# Patient Record
Sex: Male | Born: 1950
Health system: Southern US, Community
[De-identification: ages and names within clinical notes are randomized; demographics above are authoritative.]

## PROBLEM LIST (undated history)

## (undated) DIAGNOSIS — T7840XA Allergy, unspecified, initial encounter: Secondary | ICD-10-CM

## (undated) DIAGNOSIS — N138 Other obstructive and reflux uropathy: Secondary | ICD-10-CM

## (undated) DIAGNOSIS — C3491 Malignant neoplasm of unspecified part of right bronchus or lung: Secondary | ICD-10-CM

## (undated) DIAGNOSIS — J189 Pneumonia, unspecified organism: Secondary | ICD-10-CM

## (undated) DIAGNOSIS — N401 Enlarged prostate with lower urinary tract symptoms: Secondary | ICD-10-CM

## (undated) DIAGNOSIS — J449 Chronic obstructive pulmonary disease, unspecified: Secondary | ICD-10-CM

## (undated) DIAGNOSIS — M1990 Unspecified osteoarthritis, unspecified site: Secondary | ICD-10-CM

## (undated) DIAGNOSIS — R0602 Shortness of breath: Secondary | ICD-10-CM

## (undated) DIAGNOSIS — C3431 Malignant neoplasm of lower lobe, right bronchus or lung: Secondary | ICD-10-CM

## (undated) DIAGNOSIS — N5201 Erectile dysfunction due to arterial insufficiency: Secondary | ICD-10-CM

## (undated) DIAGNOSIS — K219 Gastro-esophageal reflux disease without esophagitis: Secondary | ICD-10-CM

## (undated) DIAGNOSIS — I499 Cardiac arrhythmia, unspecified: Secondary | ICD-10-CM

## (undated) DIAGNOSIS — C801 Malignant (primary) neoplasm, unspecified: Secondary | ICD-10-CM

## (undated) DIAGNOSIS — F32A Depression, unspecified: Secondary | ICD-10-CM

## (undated) DIAGNOSIS — IMO0002 Reserved for concepts with insufficient information to code with codable children: Secondary | ICD-10-CM

## (undated) DIAGNOSIS — E785 Hyperlipidemia, unspecified: Secondary | ICD-10-CM

## (undated) DIAGNOSIS — I10 Essential (primary) hypertension: Secondary | ICD-10-CM

## (undated) DIAGNOSIS — F329 Major depressive disorder, single episode, unspecified: Secondary | ICD-10-CM

## (undated) HISTORY — DX: Erectile dysfunction due to arterial insufficiency: N52.01

## (undated) HISTORY — DX: Malignant neoplasm of lower lobe, right bronchus or lung: C34.31

## (undated) HISTORY — DX: Other obstructive and reflux uropathy: N13.8

## (undated) HISTORY — PX: BACK SURGERY: SHX140

## (undated) HISTORY — DX: Reserved for concepts with insufficient information to code with codable children: IMO0002

## (undated) HISTORY — DX: Hyperlipidemia, unspecified: E78.5

## (undated) HISTORY — DX: Chronic obstructive pulmonary disease, unspecified: J44.9

## (undated) HISTORY — DX: Malignant neoplasm of unspecified part of right bronchus or lung: C34.91

## (undated) HISTORY — DX: Allergy, unspecified, initial encounter: T78.40XA

## (undated) HISTORY — DX: Benign prostatic hyperplasia with lower urinary tract symptoms: N40.1

---

## 1998-07-23 ENCOUNTER — Ambulatory Visit (HOSPITAL_COMMUNITY): Admission: RE | Admit: 1998-07-23 | Discharge: 1998-07-23 | Payer: Self-pay | Admitting: Neurosurgery

## 1998-07-23 ENCOUNTER — Encounter: Payer: Self-pay | Admitting: Neurosurgery

## 1998-08-04 ENCOUNTER — Encounter: Payer: Self-pay | Admitting: Neurosurgery

## 1998-08-04 ENCOUNTER — Ambulatory Visit (HOSPITAL_COMMUNITY): Admission: RE | Admit: 1998-08-04 | Discharge: 1998-08-04 | Payer: Self-pay | Admitting: Neurosurgery

## 1998-08-25 ENCOUNTER — Ambulatory Visit (HOSPITAL_COMMUNITY): Admission: RE | Admit: 1998-08-25 | Discharge: 1998-08-25 | Payer: Self-pay | Admitting: Neurosurgery

## 1998-08-25 ENCOUNTER — Encounter: Payer: Self-pay | Admitting: Neurosurgery

## 1999-09-10 ENCOUNTER — Encounter: Payer: Self-pay | Admitting: Neurosurgery

## 1999-09-10 ENCOUNTER — Ambulatory Visit (HOSPITAL_COMMUNITY): Admission: RE | Admit: 1999-09-10 | Discharge: 1999-09-10 | Payer: Self-pay | Admitting: Neurosurgery

## 1999-11-18 ENCOUNTER — Ambulatory Visit (HOSPITAL_COMMUNITY): Admission: RE | Admit: 1999-11-18 | Discharge: 1999-11-18 | Payer: Self-pay | Admitting: Neurosurgery

## 1999-11-18 ENCOUNTER — Encounter: Payer: Self-pay | Admitting: Neurosurgery

## 1999-12-14 ENCOUNTER — Ambulatory Visit (HOSPITAL_COMMUNITY): Admission: RE | Admit: 1999-12-14 | Discharge: 1999-12-14 | Payer: Self-pay | Admitting: Neurosurgery

## 1999-12-14 ENCOUNTER — Encounter: Payer: Self-pay | Admitting: Neurosurgery

## 2000-01-13 ENCOUNTER — Ambulatory Visit (HOSPITAL_COMMUNITY): Admission: RE | Admit: 2000-01-13 | Discharge: 2000-01-13 | Payer: Self-pay | Admitting: Neurosurgery

## 2000-01-13 ENCOUNTER — Encounter: Payer: Self-pay | Admitting: Neurosurgery

## 2008-02-07 HISTORY — PX: JOINT REPLACEMENT: SHX530

## 2008-10-26 ENCOUNTER — Inpatient Hospital Stay (HOSPITAL_COMMUNITY): Admission: RE | Admit: 2008-10-26 | Discharge: 2008-10-28 | Payer: Self-pay | Admitting: Orthopedic Surgery

## 2008-12-09 ENCOUNTER — Emergency Department (HOSPITAL_BASED_OUTPATIENT_CLINIC_OR_DEPARTMENT_OTHER): Admission: EM | Admit: 2008-12-09 | Discharge: 2008-12-09 | Payer: Self-pay | Admitting: Emergency Medicine

## 2008-12-09 ENCOUNTER — Ambulatory Visit: Payer: Self-pay | Admitting: Radiology

## 2010-02-06 HISTORY — PX: SPINE SURGERY: SHX786

## 2010-05-11 LAB — BASIC METABOLIC PANEL
BUN: 7 mg/dL (ref 6–23)
CO2: 28 mEq/L (ref 19–32)
Calcium: 9.2 mg/dL (ref 8.4–10.5)
Chloride: 100 mEq/L (ref 96–112)
Creatinine, Ser: 0.9 mg/dL (ref 0.4–1.5)
GFR calc Af Amer: >60 mL/min (ref >60)
GFR calc non Af Amer: >60 mL/min (ref >60)
Glucose, Bld: 82 mg/dL (ref 70–99)
Potassium: 4.2 mEq/L (ref 3.5–5.1)
Sodium: 136 mEq/L (ref 135–145)

## 2010-05-11 LAB — URINALYSIS, ROUTINE W REFLEX MICROSCOPIC
Bilirubin Urine: NEGATIVE
Glucose, UA: NEGATIVE mg/dL
Hgb urine dipstick: NEGATIVE
Ketones, ur: NEGATIVE mg/dL
Nitrite: NEGATIVE
Protein, ur: NEGATIVE mg/dL
Specific Gravity, Urine: 1.011 (ref 1.005–1.030)
Urobilinogen, UA: 0.2 mg/dL (ref 0.0–1.0)
pH: 6 (ref 5.0–8.0)

## 2010-05-11 LAB — POCT CARDIAC MARKERS
CKMB, poc: <1 ng/mL — ABNORMAL LOW (ref 1.0–8.0)
Myoglobin, poc: 68.4 ng/mL (ref 12–200)
Troponin i, poc: <0.05 ng/mL (ref 0.00–0.09)

## 2010-05-11 LAB — DIFFERENTIAL
Basophils Absolute: 0 10*3/uL (ref 0.0–0.1)
Basophils Relative: 1 % (ref 0–1)
Eosinophils Absolute: 0.2 10*3/uL (ref 0.0–0.7)
Eosinophils Relative: 3 % (ref 0–5)
Lymphocytes Relative: 29 % (ref 12–46)
Lymphs Abs: 2 10*3/uL (ref 0.7–4.0)
Monocytes Absolute: 0.7 10*3/uL (ref 0.1–1.0)
Monocytes Relative: 11 % (ref 3–12)
Neutro Abs: 4.1 10*3/uL (ref 1.7–7.7)
Neutrophils Relative %: 57 % (ref 43–77)

## 2010-05-11 LAB — CBC
HCT: 38 % — ABNORMAL LOW (ref 39.0–52.0)
Hemoglobin: 12.8 g/dL — ABNORMAL LOW (ref 13.0–17.0)
MCHC: 33.6 g/dL (ref 30.0–36.0)
MCV: 88.7 fL (ref 78.0–100.0)
Platelets: 348 10*3/uL (ref 150–400)
RBC: 4.28 MIL/uL (ref 4.22–5.81)
RDW: 13.2 % (ref 11.5–15.5)
WBC: 7 10*3/uL (ref 4.0–10.5)

## 2010-05-11 LAB — D-DIMER, QUANTITATIVE: D-Dimer, Quant: 2.09 ug/mL-FEU — ABNORMAL HIGH (ref 0.00–0.48)

## 2010-05-13 LAB — CBC
HCT: 35.1 % — ABNORMAL LOW (ref 39.0–52.0)
HCT: 36.9 % — ABNORMAL LOW (ref 39.0–52.0)
HCT: 49.8 % (ref 39.0–52.0)
Hemoglobin: 12 g/dL — ABNORMAL LOW (ref 13.0–17.0)
Hemoglobin: 12.5 g/dL — ABNORMAL LOW (ref 13.0–17.0)
Hemoglobin: 17 g/dL (ref 13.0–17.0)
MCHC: 33.7 g/dL (ref 30.0–36.0)
MCHC: 34.1 g/dL (ref 30.0–36.0)
MCHC: 34.1 g/dL (ref 30.0–36.0)
MCV: 93.6 fL (ref 78.0–100.0)
MCV: 93.7 fL (ref 78.0–100.0)
MCV: 94 fL (ref 78.0–100.0)
Platelets: 186 10*3/uL (ref 150–400)
Platelets: 203 10*3/uL (ref 150–400)
Platelets: 243 10*3/uL (ref 150–400)
RBC: 3.75 MIL/uL — ABNORMAL LOW (ref 4.22–5.81)
RBC: 3.93 MIL/uL — ABNORMAL LOW (ref 4.22–5.81)
RBC: 5.31 MIL/uL (ref 4.22–5.81)
RDW: 12.6 % (ref 11.5–15.5)
RDW: 12.9 % (ref 11.5–15.5)
RDW: 13 % (ref 11.5–15.5)
WBC: 11 10*3/uL — ABNORMAL HIGH (ref 4.0–10.5)
WBC: 7 10*3/uL (ref 4.0–10.5)
WBC: 8.9 10*3/uL (ref 4.0–10.5)

## 2010-05-13 LAB — URINALYSIS, ROUTINE W REFLEX MICROSCOPIC
Bilirubin Urine: NEGATIVE
Glucose, UA: NEGATIVE mg/dL
Hgb urine dipstick: NEGATIVE
Ketones, ur: NEGATIVE mg/dL
Nitrite: NEGATIVE
Protein, ur: NEGATIVE mg/dL
Specific Gravity, Urine: 1.021 (ref 1.005–1.030)
Urobilinogen, UA: 1 mg/dL (ref 0.0–1.0)
pH: 6 (ref 5.0–8.0)

## 2010-05-13 LAB — BASIC METABOLIC PANEL
BUN: 3 mg/dL — ABNORMAL LOW (ref 6–23)
CO2: 27 mEq/L (ref 19–32)
Calcium: 8.6 mg/dL (ref 8.4–10.5)
Chloride: 101 mEq/L (ref 96–112)
Creatinine, Ser: 0.9 mg/dL (ref 0.4–1.5)
GFR calc Af Amer: >60 mL/min (ref >60)
GFR calc non Af Amer: >60 mL/min (ref >60)
Glucose, Bld: 122 mg/dL — ABNORMAL HIGH (ref 70–99)
Potassium: 3.4 mEq/L — ABNORMAL LOW (ref 3.5–5.1)
Sodium: 135 mEq/L (ref 135–145)

## 2010-05-13 LAB — ABO/RH: ABO/RH(D): A POS

## 2010-05-13 LAB — COMPREHENSIVE METABOLIC PANEL
ALT: 19 U/L (ref 0–53)
AST: 24 U/L (ref 0–37)
Albumin: 4.2 g/dL (ref 3.5–5.2)
Alkaline Phosphatase: 97 U/L (ref 39–117)
BUN: 3 mg/dL — ABNORMAL LOW (ref 6–23)
CO2: 29 mEq/L (ref 19–32)
Calcium: 10 mg/dL (ref 8.4–10.5)
Chloride: 103 mEq/L (ref 96–112)
Creatinine, Ser: 0.91 mg/dL (ref 0.4–1.5)
GFR calc Af Amer: >60 mL/min (ref >60)
GFR calc non Af Amer: >60 mL/min (ref >60)
Glucose, Bld: 89 mg/dL (ref 70–99)
Potassium: 4.5 mEq/L (ref 3.5–5.1)
Sodium: 140 mEq/L (ref 135–145)
Total Bilirubin: 0.8 mg/dL (ref 0.3–1.2)
Total Protein: 7 g/dL (ref 6.0–8.3)

## 2010-05-13 LAB — TYPE AND SCREEN
ABO/RH(D): A POS
Antibody Screen: NEGATIVE

## 2010-05-13 LAB — PROTIME-INR
INR: 0.9 (ref 0.00–1.49)
INR: 1 (ref 0.00–1.49)
INR: 1.4 (ref 0.00–1.49)
Prothrombin Time: 11.6 seconds (ref 11.6–15.2)
Prothrombin Time: 12.6 seconds (ref 11.6–15.2)
Prothrombin Time: 17.2 seconds — ABNORMAL HIGH (ref 11.6–15.2)

## 2010-05-13 LAB — APTT: aPTT: 28 seconds (ref 24–37)

## 2010-07-12 ENCOUNTER — Encounter (HOSPITAL_COMMUNITY)
Admission: RE | Admit: 2010-07-12 | Discharge: 2010-07-12 | Disposition: A | Discharge status: Home / Self Care | Payer: Managed Care, Other (non HMO) | Source: Ambulatory Visit | Attending: Orthopedic Surgery | Admitting: Orthopedic Surgery

## 2010-07-12 ENCOUNTER — Other Ambulatory Visit (HOSPITAL_COMMUNITY): Payer: Self-pay | Admitting: Orthopedic Surgery

## 2010-07-12 DIAGNOSIS — F17200 Nicotine dependence, unspecified, uncomplicated: Secondary | ICD-10-CM

## 2010-07-12 LAB — SURGICAL PCR SCREEN
MRSA, PCR: NEGATIVE
Staphylococcus aureus: NEGATIVE

## 2010-07-12 LAB — CBC
HCT: 51.1 % (ref 39.0–52.0)
Hemoglobin: 18 g/dL — ABNORMAL HIGH (ref 13.0–17.0)
MCH: 31.1 pg (ref 26.0–34.0)
MCHC: 35.2 g/dL (ref 30.0–36.0)
MCV: 88.4 fL (ref 78.0–100.0)
Platelets: 221 10*3/uL (ref 150–400)
RBC: 5.78 MIL/uL (ref 4.22–5.81)
RDW: 12.9 % (ref 11.5–15.5)
WBC: 6.7 10*3/uL (ref 4.0–10.5)

## 2010-07-12 LAB — COMPREHENSIVE METABOLIC PANEL
ALT: 24 U/L (ref 0–53)
AST: 24 U/L (ref 0–37)
Albumin: 4.1 g/dL (ref 3.5–5.2)
Alkaline Phosphatase: 104 U/L (ref 39–117)
BUN: 6 mg/dL (ref 6–23)
CO2: 28 mEq/L (ref 19–32)
Calcium: 9.8 mg/dL (ref 8.4–10.5)
Chloride: 103 mEq/L (ref 96–112)
Creatinine, Ser: 0.96 mg/dL (ref 0.4–1.5)
GFR calc Af Amer: >60 mL/min (ref >60)
GFR calc non Af Amer: >60 mL/min (ref >60)
Glucose, Bld: 85 mg/dL (ref 70–99)
Potassium: 4.2 mEq/L (ref 3.5–5.1)
Sodium: 140 mEq/L (ref 135–145)
Total Bilirubin: 0.3 mg/dL (ref 0.3–1.2)
Total Protein: 7.2 g/dL (ref 6.0–8.3)

## 2010-07-12 LAB — URINALYSIS, ROUTINE W REFLEX MICROSCOPIC
Glucose, UA: NEGATIVE mg/dL
Hgb urine dipstick: NEGATIVE
Ketones, ur: NEGATIVE mg/dL
Nitrite: NEGATIVE
Protein, ur: NEGATIVE mg/dL
Specific Gravity, Urine: 1.032 — ABNORMAL HIGH (ref 1.005–1.030)
Urobilinogen, UA: 0.2 mg/dL (ref 0.0–1.0)
pH: 5.5 (ref 5.0–8.0)

## 2010-07-12 LAB — DIFFERENTIAL
Basophils Absolute: 0 10*3/uL (ref 0.0–0.1)
Basophils Relative: 0 % (ref 0–1)
Eosinophils Absolute: 0.1 10*3/uL (ref 0.0–0.7)
Eosinophils Relative: 2 % (ref 0–5)
Lymphocytes Relative: 34 % (ref 12–46)
Lymphs Abs: 2.3 10*3/uL (ref 0.7–4.0)
Monocytes Absolute: 0.6 10*3/uL (ref 0.1–1.0)
Monocytes Relative: 9 % (ref 3–12)
Neutro Abs: 3.7 10*3/uL (ref 1.7–7.7)
Neutrophils Relative %: 55 % (ref 43–77)

## 2010-07-12 LAB — APTT: aPTT: 27 seconds (ref 24–37)

## 2010-07-12 LAB — TYPE AND SCREEN
ABO/RH(D): A POS
Antibody Screen: NEGATIVE

## 2010-07-12 LAB — PROTIME-INR
INR: 0.88 (ref 0.00–1.49)
Prothrombin Time: 12.1 seconds (ref 11.6–15.2)

## 2010-07-13 ENCOUNTER — Inpatient Hospital Stay (HOSPITAL_COMMUNITY)
Admission: RE | Admit: 2010-07-13 | Discharge: 2010-07-16 | DRG: 460 | Disposition: A | Discharge status: Home / Self Care | Payer: Managed Care, Other (non HMO) | Source: Ambulatory Visit | Attending: Orthopedic Surgery | Admitting: Orthopedic Surgery

## 2010-07-13 ENCOUNTER — Inpatient Hospital Stay (HOSPITAL_COMMUNITY): Payer: Managed Care, Other (non HMO)

## 2010-07-13 DIAGNOSIS — M48061 Spinal stenosis, lumbar region without neurogenic claudication: Principal | ICD-10-CM | POA: Diagnosis present

## 2010-07-13 DIAGNOSIS — M4310 Spondylolisthesis, site unspecified: Secondary | ICD-10-CM | POA: Diagnosis present

## 2010-07-13 LAB — POCT I-STAT 4, (NA,K, GLUC, HGB,HCT)
Glucose, Bld: 146 mg/dL — ABNORMAL HIGH (ref 70–99)
Glucose, Bld: 142 mg/dL — ABNORMAL HIGH (ref 70–99)
HCT: 57 % — ABNORMAL HIGH (ref 39.0–52.0)
HCT: 36 % — ABNORMAL LOW (ref 39.0–52.0)
Hemoglobin: 19.4 g/dL — ABNORMAL HIGH (ref 13.0–17.0)
Hemoglobin: 12.2 g/dL — ABNORMAL LOW (ref 13.0–17.0)
Potassium: 3.9 mEq/L (ref 3.5–5.1)
Potassium: 4.2 mEq/L (ref 3.5–5.1)
Sodium: 137 mEq/L (ref 135–145)
Sodium: 137 mEq/L (ref 135–145)

## 2010-07-14 LAB — CBC
HCT: 36 % — ABNORMAL LOW (ref 39.0–52.0)
Hemoglobin: 12.2 g/dL — ABNORMAL LOW (ref 13.0–17.0)
MCH: 30 pg (ref 26.0–34.0)
MCHC: 33.9 g/dL (ref 30.0–36.0)
MCV: 88.7 fL (ref 78.0–100.0)
Platelets: 167 10*3/uL (ref 150–400)
RBC: 4.06 MIL/uL — ABNORMAL LOW (ref 4.22–5.81)
RDW: 12.8 % (ref 11.5–15.5)
WBC: 11.6 10*3/uL — ABNORMAL HIGH (ref 4.0–10.5)

## 2010-07-19 NOTE — Discharge Summary (Signed)
  NAMEOWIN, VIGNOLA              ACCOUNT NO.:  [id number]  MEDICAL RECORD NO.:  [id number]  LOCATION:  5029                         FACILITY:  [facility]  PHYSICIAN:  Estill Bamberg, MD      DATE OF BIRTH:  07-15-1950  DATE OF ADMISSION:  07/13/2010 DATE OF DISCHARGE:  07/16/2010                              DISCHARGE SUMMARY   ADMISSION DIAGNOSIS:  Spinal stenosis.  ADMITTING PHYSICIAN:  Estill Bamberg, MD  PROCEDURE PERFORMED:  L3-S1 decompression with a posterolateral fusion with instrumentation at L4-5 on July 13, 2010.  ADMISSION HISTORY:  Briefly, Mr. Daily is a very pleasant 60 year old male who presented to me with right greater the left leg pain.  I did review an MRI, which was consistent with three-level spondylosis and a grade 1 slip at the L4-5 level.  We did go forward with extensive conservative treatment, but the patient continued to have ongoing discomfort and pain.  We, therefore, had a discussion regarding a decompression and fusion procedure.  The patient fully understood the risks and limitations of the procedure and was ultimately admitted on July 13, 2010, for the procedure noted above.  HOSPITAL COURSE:  On July 13, 2010, the patient was brought to surgery and underwent the procedure noted above.  The patient tolerated the procedure well was transferred to recovery in stable condition.  The patient was given a Dilaudid PCA, which was discontinued on postoperative day #1.  An epidural drain was placed and was removed on postoperative day #2.  The patient was mobilized with physical therapy throughout his hospital stay and it was felt on the postoperative day #3, the patient's pain was under good control with oral pain medications, and that the patient was safe for discharge home.  The patient was tolerating a diet at that time as well.  The patient's wound was clean, dry, and intact, and he was neurovascularly intact throughout his hospital stay.  He did note  improvement in his radicular pain.  DISCHARGE INSTRUCTIONS:  The patient will take Percocet for pain and Valium for spasms.  He was educated on back precautions and will adhere those precautions for the first 3 months.  I will see the patient back in my office in 2 weeks after his procedure.  The patient will contact my office with any signs or symptoms of infection.     Estill Bamberg, MD     MD/MEDQ  D:  07/18/2010  T:  07/18/2010  Job:  [id number]  Electronically Signed by Estill Bamberg  on 07/19/2010 04:35:11 PM

## 2010-07-19 NOTE — Op Note (Signed)
[name], Marshall              ACCOUNT NO.:  [id number]  MEDICAL RECORD NO.:  [id number]  LOCATION:  5029                         FACILITY:  [facility]  PHYSICIAN:  Estill Bamberg, MD      DATE OF BIRTH:  1950/09/10  DATE OF PROCEDURE:  07/13/2010 DATE OF DISCHARGE:                              OPERATIVE REPORT   PREOPERATIVE DIAGNOSES: 1. L3-4, L4-5 and L5-S1 spinal stenosis. 2. Grade 1 L4-5 spondylolisthesis.  POSTOPERATIVE DIAGNOSES: 1. L3-4, L4-5 and L5-S1 spinal stenosis. 2. Grade 1 L4-5 spondylolisthesis.  PROCEDURES: 1. Laminectomy with partial facetectomy and bilateral foraminotomy L3-     4, L4-5, L5-S1, bilateral. 2. Posterolateral lumbar fusion L4-5. 3. Placement of posterior instrumentation L4, L5. 4. Use of local autograft expander 5. Intraoperative use of fluoroscopy.  SURGEON:  Estill Bamberg, MD  ASSISTANT:  Janace Litten, OPA  ANESTHESIA:  General endotracheal anesthesia.  COMPLICATIONS:  None.  DISPOSITION:  Stable.  ESTIMATED BLOOD LOSS:  1200 mL.  FINDINGS:  Severe foraminal stenosis bilaterally at L4-5 and L5-S1 and left greater than right foraminal stenosis at L3-4.  INSTRUMENT USED:  Expedium 7 x 45 mm screws placed at L4 and L5 bilaterally with a 38-mm rod.  INDICATIONS FOR PROCEDURE:  Briefly, [name] is a very pleasant 60- year-old male who initially presented to me on April 16, 2010, with right leg greater than low back pain.  I did review an MRI which was consistent with a grade 1 spondylolisthesis at L4-5, in addition of spinal stenosis at the lower 3 levels.  We did go forward with extensive nonoperative measures and the patient continued to have ongoing pain. The patient ultimately went onto have pain not only in the right leg, but also on the left.  The patient did have multiple epidural injections and he did state that he did get temporary improvement with the injections.  We also attempted physical therapy, anti-inflammatories  and various pain medications.  He continued to have ongoing pain.  We, therefore, had a discussion regarding going forward with the procedure noted above.  The patient fully understood the risks and limitations of the procedure as outlined in my preoperative note.  OPERATIVE DETAILS:  On July 13, 2010, the patient was brought to surgery and general endotracheal anesthesia was administered.  The patient's foot was placed prone on a well-padded Jackson spinal frame.  All bony prominences were meticulously padded.  Ancef was given for antibiotic prophylaxis and SCDs were placed.  The back was prepped and draped in the usual sterile fashion.  Time-out procedure was performed.  I then obtained a lateral intraoperative radiograph to identify the trajectory of the L4 and L5 vertebral bodies.  I then made incision from approximately spinous process of L2 to approximately spinous process of S1.  The fascia was incised and the paraspinal musculature was bluntly swept laterally.  I then subperiosteally exposed the lamina of L4-L5 and the transverse processes of L4 and L5 bilaterally.  The posterolateral gutter was then packed with Ray-Tec soaked with thrombin bilaterally. At this point, I did cannulate the L4 and L5 screws using a 4-mm bur in a gearshift probe, followed by ball-tip probe and a 6-mm tap.  I did identify that there was no cortical violation with a ball-tip probe. The holes were then sealed using bone wax.  I then went forward with the decompression aspect of the procedure.  I removed the spinous processes of L3-L4 and L5 and a super aspect of S1.  I then went forward a central decompression using a series of Kerrisons and a Woodson.  I then went forward with a lateral recess and foraminal decompression on the right side.  There was noted to be extensive ligamentum flavum hypertrophy at each intervertebral level.  This also noted to be rather extensive neural foraminal stenosis at each  level decompressed.  At the termination of the decompression, however, I was able to easily pass a Woodson out the L3-4, L4-5 and L5-S1 neural foramen on the right side. I then turned my attention towards the patient's left side and a similar decompression as previously noted.  Of note, there was ongoing oozing throughout the case and I did continually control all the epidural bleeding using Gelfoam, bipolar electrocautery and FloSeal.  I was very happy with the final decompression.  I then packed the epidural space with FloSeal and patties.  I then turned my attention towards the patient's right posterolateral gutter.  A 4-mm high-speed bur was used to decorticate the transverse processes of L4 and L5 as well as the L4-5 facet joint and the pars interarticularis of L4 and L5.  I did pack the bone graft that was obtained from the decompression aspect of the procedure into the posterolateral gutter.  A 7 x 45 mm screws were placed into L4 and L5.  I did test each of the screws using triggered EMG and there was no screw that tested below 30 mA.  I then placed a 30- mm rod and the final tightening procedure was performed.  I then decorticated the left side in the manner described previously and packed autograft into the posterolateral gutter as previously described. Screws were placed and again were tested.  Again, there was no abnormal EMG response below 30 mA.  I then placed a 30-mm rod and again, final tightening procedure was performed.  I then again turned my attention towards controlling the epidural bleeding.  I did again liberally use bipolar electrocautery in addition to FloSeal.  I was able to control all epidural bleeding completely.  There was no extravasation of cerebrospinal fluid noted throughout the entire procedure.  Of note, prior to placing the autograft, the wound was copiously irrigated with 1 L of normal saline.  Retractors were relaxed every 30 minutes.  Also of note,  an additional gram of Ancef was given 4 hours into the procedure. I then closed the fascia using #1 Vicryl.  The subcutaneous layer was closed using 2-0 Vicryl and skin was closed using 3-0 Monocryl.  All instrument counts were correct at the termination of the procedure. Also of note, I did use live intraoperative fluoroscopy in both the AP and lateral planes well placing the L4-L5 instrumentation.  Of note, Janace Litten was my assistant throughout the procedure and aided in essential retraction and suctioning required throughout the procedure.     Estill Bamberg, MD     MD/MEDQ  D:  07/13/2010  T:  07/14/2010  Job:  [id number]  Electronically Signed by Estill Bamberg  on 07/19/2010 04:35:09 PM

## 2011-09-26 ENCOUNTER — Ambulatory Visit (INDEPENDENT_AMBULATORY_CARE_PROVIDER_SITE_OTHER): Payer: Managed Care, Other (non HMO) | Admitting: Internal Medicine

## 2011-09-26 VITALS — BP 134/92 | HR 96 | Temp 98.4°F | Resp 16 | Ht 67.5 in | Wt 162.0 lb

## 2011-09-26 DIAGNOSIS — J439 Emphysema, unspecified: Secondary | ICD-10-CM

## 2011-09-26 DIAGNOSIS — R059 Cough, unspecified: Secondary | ICD-10-CM

## 2011-09-26 DIAGNOSIS — J449 Chronic obstructive pulmonary disease, unspecified: Secondary | ICD-10-CM | POA: Insufficient documentation

## 2011-09-26 DIAGNOSIS — J40 Bronchitis, not specified as acute or chronic: Secondary | ICD-10-CM

## 2011-09-26 DIAGNOSIS — F17200 Nicotine dependence, unspecified, uncomplicated: Secondary | ICD-10-CM | POA: Insufficient documentation

## 2011-09-26 DIAGNOSIS — R05 Cough: Secondary | ICD-10-CM

## 2011-09-26 MED ORDER — IPRATROPIUM BROMIDE 0.02 % IN SOLN
0.5000 mg | Freq: Once | RESPIRATORY_TRACT | Status: AC
  Administered 2011-09-26: 0.5 mg via RESPIRATORY_TRACT

## 2011-09-26 MED ORDER — METHYLPREDNISOLONE ACETATE 80 MG/ML IJ SUSP
120.0000 mg | Freq: Once | INTRAMUSCULAR | Status: AC
  Administered 2011-09-26: 120 mg via INTRAMUSCULAR

## 2011-09-26 MED ORDER — PREDNISONE 10 MG PO TABS: ORAL_TABLET | ORAL | Status: DC

## 2011-09-26 MED ORDER — AZITHROMYCIN 500 MG PO TABS: 500.0000 mg | ORAL_TABLET | Freq: Every day | ORAL | Status: AC

## 2011-09-26 MED ORDER — ALBUTEROL SULFATE (2.5 MG/3ML) 0.083% IN NEBU
2.5000 mg | INHALATION_SOLUTION | Freq: Once | RESPIRATORY_TRACT | Status: AC
  Administered 2011-09-26: 2.5 mg via RESPIRATORY_TRACT

## 2011-09-26 MED ORDER — ALBUTEROL SULFATE HFA 108 (90 BASE) MCG/ACT IN AERS: 2.0000 | INHALATION_SPRAY | Freq: Four times a day (QID) | RESPIRATORY_TRACT | Status: DC | PRN

## 2011-09-26 NOTE — Patient Instructions (Addendum)

## 2011-09-26 NOTE — Progress Notes (Signed)
  Subjective:    Patient ID: Elijah Marshall, male    DOB: 04-08-1950, 61 y.o.   MRN: [id number]  HPI Smoker for years c/o sob, cough,unable to bring up sputum. No fever, knows he needs to quit smoking   Review of Systems     Objective:   Physical Exam  Vitals reviewed. Constitutional: He is oriented to person, place, and time. He appears well-developed and well-nourished. No distress.  HENT:  Right Ear: External ear normal.  Left Ear: External ear normal.  Nose: Nose normal.  Mouth/Throat: Oropharynx is clear and moist.  Eyes: EOM are normal.  Neck: Neck supple.  Cardiovascular: Normal rate, regular rhythm and normal heart sounds.   Pulmonary/Chest: No respiratory distress. He has decreased breath sounds. He has wheezes. He has rhonchi. He has no rales.  Lymphadenopathy:    He has no cervical adenopathy.  Neurological: He is alert and oriented to person, place, and time.  Skin: Skin is warm and dry.  Psychiatric: He has a normal mood and affect.   PFR  250 Neb with atrovent and albuterol       Assessment & Plan:  Acute bronchitis with copd Quit smoking Zithromax/Prednisone/albuterol

## 2011-09-27 ENCOUNTER — Ambulatory Visit: Payer: Managed Care, Other (non HMO)

## 2011-09-27 ENCOUNTER — Ambulatory Visit (INDEPENDENT_AMBULATORY_CARE_PROVIDER_SITE_OTHER): Payer: Managed Care, Other (non HMO) | Admitting: Family Medicine

## 2011-09-27 VITALS — BP 144/94 | HR 98 | Temp 98.1°F | Resp 20 | Ht 67.0 in | Wt 166.0 lb

## 2011-09-27 DIAGNOSIS — J40 Bronchitis, not specified as acute or chronic: Secondary | ICD-10-CM

## 2011-09-27 DIAGNOSIS — R0989 Other specified symptoms and signs involving the circulatory and respiratory systems: Secondary | ICD-10-CM

## 2011-09-27 DIAGNOSIS — R198 Other specified symptoms and signs involving the digestive system and abdomen: Secondary | ICD-10-CM

## 2011-09-27 DIAGNOSIS — R079 Chest pain, unspecified: Secondary | ICD-10-CM

## 2011-09-27 DIAGNOSIS — F458 Other somatoform disorders: Secondary | ICD-10-CM

## 2011-09-27 MED ORDER — CEFDINIR 300 MG PO CAPS: 300.0000 mg | ORAL_CAPSULE | Freq: Two times a day (BID) | ORAL | Status: AC

## 2011-09-27 MED ORDER — GI COCKTAIL ~~LOC~~
30.0000 mL | Freq: Once | ORAL | Status: AC
  Administered 2011-09-27: 30 mL via ORAL

## 2011-09-27 NOTE — Progress Notes (Signed)
[facility] [address] [place] [address] [phone]  Date:  09/27/2011   Name:  Elijah Marshall   DOB:  1950-09-08   MRN:  [id number]  PCP:  Tally Due, MD    Chief Complaint: Mass   History of Present Illness:  Elijah Marshall is a 61 y.o. very pleasant male patient who presents with the following:  He was here yesterday with a COPD exacerbation and bronchitis.  He was treated with  Depo-medrol IM, a neb treatment, and was sent home with po prednisone, azithromycin an albuterol inhaler.   Last night he started to note a sensation of a lump in his throat.  He was afraid that his throat might close and stayed up most of the night. No pain or ST.  He felt that it was difficult to swallow. No difficulty breathing.  However, he did have a hard time coughing up phlegm.  He is able to swallow both foods and liquids ok- nothing is actually getting stuck.   His symptoms are a lot better today compared to last night.  No lip or tongue swelling.   He has not had any hives or rash.  He did not try any medication such as benadryl.    He had a similar problem after swallowing a pill a few years ago.  His symptoms seems to start after he took his azithro last night.  He has noted some anterior CP for the last 2 days.  It is not severe, and he thinks it is due to coughing.  No known heart problems.  The pain is related to coughing but not to anything else.    He has a history of GERD.  He has been taking some zantac to relieve these symptoms.    Patient Active Problem List  Diagnosis  . COPD (chronic obstructive pulmonary disease)  . Nicotine addiction    No past medical history on file.  No past surgical history on file.  History  Substance Use Topics  . Smoking status: Former Smoker    Quit date: 09/24/2011  . Smokeless tobacco: Not on file  . Alcohol Use: Not on file    No family history on file.  Allergies  Allergen Reactions  . Aspirin  Nausea And Vomiting    Medication list has been reviewed and updated.  Current Outpatient Prescriptions on File Prior to Visit  Medication Sig Dispense Refill  . albuterol (PROVENTIL HFA;VENTOLIN HFA) 108 (90 BASE) MCG/ACT inhaler Inhale 2 puffs into the lungs every 6 (six) hours as needed for wheezing.  1 Inhaler  3  . azithromycin (ZITHROMAX) 500 MG tablet Take 1 tablet (500 mg total) by mouth daily.  5 tablet  0  . cyclobenzaprine (FLEXERIL) 10 MG tablet Take 10 mg by mouth. Q 4-6H PRN      . HYDROcodone-acetaminophen (VICODIN) 5-500 MG per tablet Take 1 tablet by mouth as needed.      Marland Kitchen ipratropium (ATROVENT) 0.06 % nasal spray Place 2 sprays into the nose 2 (two) times daily.      Marland Kitchen oxyCODONE-acetaminophen (ROXICET) 5-325 MG/5ML solution Take by mouth.      . predniSONE (DELTASONE) 10 MG tablet Take 2 po bid for 5days, take 1 po for 5days, take 1 po qd for 5days pc for breathing  21 tablet  0  . ranitidine (ZANTAC) 150 MG tablet Take 150 mg by mouth as needed.      . tadalafil (CIALIS)  20 MG tablet Take 20 mg by mouth daily as needed.       No current facility-administered medications on file prior to visit.    Review of Systems:  As per HPI- otherwise negative.   Physical Examination: Filed Vitals:   09/27/11 1538  BP: 144/94  Pulse: 98  Temp: 98.1 F (36.7 C)  Resp: 20   Filed Vitals:   09/27/11 1538  Height: 5\' 7"  (1.702 m)  Weight: 166 lb (75.297 kg)   Body mass index is 26.00 kg/(m^2). Ideal Body Weight: Weight in (lb) to have BMI = 25: 159.3   GEN: WDWN, NAD, Non-toxic, A & O x 3, no distress HEENT: Atraumatic, Normocephalic. Neck supple. No masses, No LAD. TM and oropharynx wnl: No sign of angioedema- no lip or tongue swelling, no soft palate swelling.    PEERL, EOMI Ears and Nose: No external deformity. CV: RRR, No M/G/R. No JVD. No thrill. No extra heart sounds. PULM: CTA B, no wheezes, crackles, rhonchi. No retractions. No resp. distress. No accessory  muscle use. ABD: S, NT, ND. No HSM. EXTR: No c/c/e.  No hives or rash.  NEURO Normal gait.  PSYCH: Normally interactive. Conversant. Not depressed or anxious appearing.  Calm demeanor.   [facility] reading (PRIMARY) by  Dr. Patsy Lager.  C spine 2V: moderate degenerative changes C spine.  No FB noted.    CERVICAL SPINE - 2-3 VIEW  Comparison: None.  Findings: The prevertebral soft tissues are normal. There is mild straightening of the usual cervical lordosis. There is no focal angulation or listhesis. There is moderately advanced disc space loss from C4-C5 through C6-C7 with associated uncinate spurring and facet hypertrophy. Ossification of the ligamentum nucha is noted. The C1-C2 articulation appears normal in the AP projection. There is no evidence of acute fracture.  IMPRESSION: No evidence of acute cervical spine fracture, subluxation or static signs of instability. Moderate spondylosis inferiorly as described.  EKG: NSR, no ST elevation or depression  Gave GI cocktail- this did help relieve his symptoms somewhat.  Assessment and Plan: 1. Globus sensation  gi cocktail (Maalox,Lidocaine,Donnatal), DG Cervical Spine 2-3 Views  2. Chest pain  EKG 12-Lead  3. Bronchitis  cefdinir (OMNICEF) 300 MG capsule   Elijah Marshall is here today with a globus sensation.  He had been afraid that this meant that his throat was closing- however explained that the symptoms and signs of angioedema are usually quite different.  He is aware that he needs to watch out for any lip/ tongue/oral cavity swelling or difficulty breathing- if any of these occur seek emergency care.  However, globus sensation is usually benign and does resolve.  Will D/C azithromycin in case this pill irritated his throat.  Drink plenty of fluids and eat a soft diet. Will change to cefdinir for his bronchitis.  Let me know if globus sensation not resolved in the next day or two.  Sooner if worse.   CP seems to be non- cardiac in origin.  Let  me know if this comes back or changes, seek emergency care if severe.    Abbe Amsterdam, MD

## 2011-12-30 ENCOUNTER — Ambulatory Visit (INDEPENDENT_AMBULATORY_CARE_PROVIDER_SITE_OTHER): Payer: BC Managed Care – PPO | Admitting: Emergency Medicine

## 2011-12-30 VITALS — BP 145/94 | HR 79 | Temp 98.1°F | Resp 18 | Wt 176.0 lb

## 2011-12-30 DIAGNOSIS — J449 Chronic obstructive pulmonary disease, unspecified: Secondary | ICD-10-CM

## 2011-12-30 DIAGNOSIS — F17200 Nicotine dependence, unspecified, uncomplicated: Secondary | ICD-10-CM

## 2011-12-30 DIAGNOSIS — J029 Acute pharyngitis, unspecified: Secondary | ICD-10-CM

## 2011-12-30 DIAGNOSIS — J45909 Unspecified asthma, uncomplicated: Secondary | ICD-10-CM

## 2011-12-30 DIAGNOSIS — H04209 Unspecified epiphora, unspecified lacrimal gland: Secondary | ICD-10-CM

## 2011-12-30 DIAGNOSIS — H04203 Unspecified epiphora, bilateral lacrimal glands: Secondary | ICD-10-CM

## 2011-12-30 DIAGNOSIS — J309 Allergic rhinitis, unspecified: Secondary | ICD-10-CM

## 2011-12-30 DIAGNOSIS — R059 Cough, unspecified: Secondary | ICD-10-CM

## 2011-12-30 DIAGNOSIS — R05 Cough: Secondary | ICD-10-CM

## 2011-12-30 DIAGNOSIS — J302 Other seasonal allergic rhinitis: Secondary | ICD-10-CM

## 2011-12-30 LAB — POCT RAPID STREP A (OFFICE): Rapid Strep A Screen: NEGATIVE

## 2011-12-30 MED ORDER — ALBUTEROL SULFATE (2.5 MG/3ML) 0.083% IN NEBU
2.5000 mg | INHALATION_SOLUTION | Freq: Once | RESPIRATORY_TRACT | Status: AC
  Administered 2011-12-30: 2.5 mg via RESPIRATORY_TRACT

## 2011-12-30 MED ORDER — FLUTICASONE PROPIONATE 50 MCG/ACT NA SUSP: 2.0000 | Freq: Every day | NASAL | Status: DC

## 2011-12-30 MED ORDER — PREDNISONE 10 MG PO TABS: ORAL_TABLET | ORAL | Status: DC

## 2011-12-30 MED ORDER — ALBUTEROL SULFATE HFA 108 (90 BASE) MCG/ACT IN AERS: 2.0000 | INHALATION_SPRAY | Freq: Four times a day (QID) | RESPIRATORY_TRACT | Status: DC | PRN

## 2011-12-30 NOTE — Progress Notes (Signed)
  Subjective:    Patient ID: Elijah Marshall, male    DOB: 08-06-1950, 61 y.o.   MRN: [id number]  HPI  61 y.o male presents today with: itchy and burning eyes, states it feels like something is "coating his eyes", nasal drainage with burning as well. + Cough with productive thick phlegm.  Sight sore throat.  Uses albuterol inhaler as needed, has been using recently.  Does have history of allergies.  Has experienced some chest tightness.  Review of Systems     Objective:   Physical Exam HEENT exam reveals mild redness of the conjunctiva of both eyes. His nose is congested with turbinates bluish. The TMs are clear throat is slightly red neck is supple there is decreased breath sounds present in the bases but no audible wheezes        Assessment & Plan:  Will wait on rapid strep and have neb tx with albuterol at office today. I told the patient if he is not better in 7-10 days he needs to return to clinic chest x-ray. He is down to smoking 2 cigarettes a day. His last chest x-ray did show COPD

## 2012-01-16 ENCOUNTER — Other Ambulatory Visit: Payer: Self-pay | Admitting: Orthopedic Surgery

## 2012-01-18 ENCOUNTER — Ambulatory Visit (INDEPENDENT_AMBULATORY_CARE_PROVIDER_SITE_OTHER): Payer: Managed Care, Other (non HMO) | Admitting: Family Medicine

## 2012-01-18 ENCOUNTER — Encounter: Payer: Self-pay | Admitting: Family Medicine

## 2012-01-18 VITALS — BP 129/83 | HR 109 | Temp 97.6°F | Resp 16 | Ht 69.0 in | Wt 173.2 lb

## 2012-01-18 DIAGNOSIS — N61 Mastitis without abscess: Secondary | ICD-10-CM

## 2012-01-18 DIAGNOSIS — R12 Heartburn: Secondary | ICD-10-CM

## 2012-01-18 DIAGNOSIS — Z01818 Encounter for other preprocedural examination: Secondary | ICD-10-CM

## 2012-01-18 DIAGNOSIS — J449 Chronic obstructive pulmonary disease, unspecified: Secondary | ICD-10-CM

## 2012-01-18 LAB — PULMONARY FUNCTION TEST

## 2012-01-18 MED ORDER — METRONIDAZOLE 500 MG PO TABS: 500.0000 mg | ORAL_TABLET | Freq: Three times a day (TID) | ORAL | Status: DC

## 2012-01-18 MED ORDER — SULFAMETHOXAZOLE-TRIMETHOPRIM 800-160 MG PO TABS: 2.0000 | ORAL_TABLET | Freq: Two times a day (BID) | ORAL | Status: DC

## 2012-01-18 MED ORDER — GI COCKTAIL ~~LOC~~: 30.0000 mL | Freq: Once | ORAL | Status: DC

## 2012-01-18 MED ORDER — TIOTROPIUM BROMIDE MONOHYDRATE 18 MCG IN CAPS: 18.0000 ug | ORAL_CAPSULE | Freq: Every day | RESPIRATORY_TRACT | Status: DC

## 2012-01-18 NOTE — Progress Notes (Signed)
Subjective:    Patient ID: Elijah Marshall, male    DOB: Jun 27, 1950, 61 y.o.   MRN: [id number] Chief Complaint  Patient presents with  . Breast Problem    swollen painful left nipple x 4-5 days also needs surgical clearance for back surgery    HPI  Had spinal fusion a little over a yr ago and he must having pinched another nerve in PT during recovery.    Smoking a cigarette occasionally.  Unsure if he actually has COPD or not though has been treated for exacerbations x 2 this yr.  Does notice that he gets a little more winded with going up stairs than he used to but doesn't feel fatigued or like his activities are limited.  Having soreness in L breast and feels like there is pus underneath his nipple with a firm rubbery nodule next to nipple but hasn't been draining.  No prior mass.  While he was in the office he developed right upper chest burning that radiates to his scapula across his shoulder. He does have a h/o indigestion and heartburn and ate a steak and onion biscuite for breakfast this a.m.    Review of Systems  Constitutional: Negative for fever, chills, diaphoresis, activity change, appetite change, fatigue and unexpected weight change.  Eyes: Negative for visual disturbance.  Respiratory: Negative for apnea, cough, chest tightness, shortness of breath and wheezing.   Cardiovascular: Positive for chest pain. Negative for palpitations and leg swelling.  Gastrointestinal: Negative for nausea, vomiting, abdominal pain, diarrhea and constipation.  Musculoskeletal: Positive for back pain and arthralgias. Negative for joint swelling and gait problem.  Skin: Negative for color change and wound.  Neurological: Negative for dizziness, syncope, facial asymmetry, weakness, light-headedness and headaches.  Hematological: Negative for adenopathy. Does not bruise/bleed easily.  Psychiatric/Behavioral: Negative for sleep disturbance and dysphoric mood.      BP 129/83  Pulse 109  Temp  97.6 F (36.4 C) (Oral)  Resp 16  Ht 5\' 9"  (1.753 m)  Wt 173 lb 3.2 oz (78.563 kg)  BMI 25.58 kg/m2  SpO2 98% Objective:   Physical Exam  Constitutional: He is oriented to person, place, and time. He appears well-developed and well-nourished. No distress.  HENT:  Head: Normocephalic and atraumatic.  Right Ear: External ear normal.  Left Ear: External ear normal.  Nose: Nose normal.  Mouth/Throat: No oropharyngeal exudate.  Eyes: Conjunctivae normal and EOM are normal. Pupils are equal, round, and reactive to light. No scleral icterus.  Neck: Normal range of motion. Neck supple. Carotid bruit is not present. No mass and no thyromegaly present.  Cardiovascular: Normal rate, regular rhythm, normal heart sounds and intact distal pulses.   Pulmonary/Chest: Effort normal and breath sounds normal. No respiratory distress.  Abdominal: Soft. Bowel sounds are normal. He exhibits no distension. There is no tenderness. There is no rebound and no guarding.  Musculoskeletal: He exhibits no edema.  Lymphadenopathy:    He has no cervical adenopathy.  Neurological: He is alert and oriented to person, place, and time. He exhibits normal muscle tone.  Skin: Skin is warm and dry. He is not diaphoretic. No erythema. No pallor.       Left subareolar 3 cm dm well-defined fluctuant tender mass, no warmth, drainage, erythema, or skin changes  Psychiatric: He has a normal mood and affect. His behavior is normal.   Office Spirometry Results: FEV1: 1.44 liters FVC: 3.55 liters FEV1/FVC: 40.6 % FVC  % Predicted: 92 liters FEV % Predicted: 48  liters FeF 25-75: 0.42 liters FeF 25-75 % Predicted: 15     EKG - NSR, no ischemic changes. Assessment & Plan:  1. Severe COPD - gold stage III, however, pt with mild symptoms so will start on monotherapy with Spiriva.  Recheck at f/u with repeat spirometry in 1-2 mos and consider addition of combination inhaled corticosteroid and LABA.  Consider pulmonary rehab after  surgery. Stop smoking totally. 2.  Surgical clearance - increased risk due to above. Note sent to [facility] - should be fine to proceed as long as anesthesia is aware of suboptimal pulm status 3. Mastitis - start flagyl and bactrim for coverage (flagyl as could be anaerobic since subareolar). Area outlined w/ skinscript marker and pt will RTC tomorrow if notably worsening and in 2d if stable/improving to assess whether it needs I&D.  Freq warm compresses to open up ducts and encourage drainage.  Unusual in an older man so concern for underlying cause - consider [place] for any remaining mass when infection is resolved. 4.  Heartburn - GI cocktail given in office and chest sxs resolved.

## 2012-01-19 ENCOUNTER — Encounter (HOSPITAL_COMMUNITY): Payer: Self-pay | Admitting: Respiratory Therapy

## 2012-01-20 ENCOUNTER — Ambulatory Visit (INDEPENDENT_AMBULATORY_CARE_PROVIDER_SITE_OTHER): Payer: Managed Care, Other (non HMO) | Admitting: Family Medicine

## 2012-01-20 VITALS — BP 122/82 | HR 81 | Temp 98.6°F | Resp 18 | Wt 173.0 lb

## 2012-01-20 DIAGNOSIS — N61 Mastitis without abscess: Secondary | ICD-10-CM

## 2012-01-20 DIAGNOSIS — N611 Abscess of the breast and nipple: Secondary | ICD-10-CM

## 2012-01-20 NOTE — Progress Notes (Signed)
  Subjective:    Patient ID: Elijah Marshall, male    DOB: 1950/08/28, 61 y.o.   MRN: [id number]  HPI Elijah Marshall is a 61 y.o. male  Here for follow up infection around L nipple area.  Had about 4 days of soreness prior to presentation 2 days ago.  Progressive soreness.  Diagnosed with Mastitis - started flagyl and bactrim for coverage (flagyl as could be anaerobic since subareolar). Area outlined w/ skinscript marker and here for recheck.   Taking both antibiotics without difficulty, no fever,  no discharge.  Less sore, same swelling.   Similar symptoms about 7 months ago - sore in this area, but no discharge. Resolved on own.     Review of Systems  Constitutional: Negative for fever.  Respiratory: Negative for chest tightness.   Cardiovascular: Negative for chest pain.  Skin: Negative for rash.  Hematological: Negative for adenopathy.   As above.  No fever/chills. n axillary pain or  Known enlarged ymph nodes.     Objective:   Physical Exam  Constitutional: He is oriented to person, place, and time. He appears well-developed and well-nourished. No distress.  Pulmonary/Chest: Effort normal. Left breast exhibits nipple discharge and tenderness. Left breast exhibits no skin change. Breasts are asymmetrical (L nipple slighltly enlarged vs. R. ).    Lymphadenopathy:    He has no cervical adenopathy (no supraclavicular or axillary adenopathy. ).  Neurological: He is alert and oriented to person, place, and time.  Skin: Skin is warm. No rash noted.  Psychiatric: He has a normal mood and affect. His behavior is normal.       Assessment & Plan:  STERLING MONDO is a 61 y.o. male 1. Cellulitis and abscess of breast  Wound culture   L nipple/ areolar cellulitis/abcess.  No surface skin changes noted.  Expressed exudate to culture.  Tolerating antibiotics, and improved discomfort, stable swelling.  Held on incision/drainage today, but will recheck in 48 hours. rtc tomorrow if  worse. Continue septra and flagyl, warm compresses.   Plan on ultrasound +/- mammogram after resolution of current symptoms to rule out underlying cyst/nodule.  Discussed need for this with patient.

## 2012-01-22 ENCOUNTER — Encounter (HOSPITAL_COMMUNITY): Payer: Self-pay | Admitting: Vascular Surgery

## 2012-01-24 ENCOUNTER — Ambulatory Visit (INDEPENDENT_AMBULATORY_CARE_PROVIDER_SITE_OTHER): Payer: Managed Care, Other (non HMO) | Admitting: Family Medicine

## 2012-01-24 ENCOUNTER — Ambulatory Visit: Payer: Managed Care, Other (non HMO)

## 2012-01-24 VITALS — BP 130/82 | HR 108 | Temp 98.3°F | Resp 18 | Ht 67.0 in | Wt 169.8 lb

## 2012-01-24 DIAGNOSIS — N61 Mastitis without abscess: Secondary | ICD-10-CM

## 2012-01-24 LAB — WOUND CULTURE
Gram Stain: NONE SEEN
Gram Stain: NONE SEEN
Organism ID, Bacteria: NO GROWTH

## 2012-01-24 MED ORDER — METRONIDAZOLE 500 MG PO TABS: 500.0000 mg | ORAL_TABLET | Freq: Three times a day (TID) | ORAL | Status: DC

## 2012-01-24 NOTE — Progress Notes (Signed)
  Subjective:    Patient ID: COLSEN MODI, male    DOB: [birth year], 61 y.o.   MRN: [id number]  HPI JAQUEZ FARRINGTON is a 60 y.o. male  Follow up from 12/12 and 12/14.  See prior ov's.    infection around L nipple area.  Had about 4 days of soreness prior to presentation on 01/18/12.  Progressive soreness.  Diagnosed with Mastitis - started flagyl and bactrim for coverage (flagyl as could be anaerobic since subareolar). Area outlined w/ skinscript marker.  Recheck 12/14: Taking both antibiotics without difficulty, no fever,  no discharge.  Less sore, same swelling. Elicited further hx that had similar symptoms about 7 months ago - sore in this area, but no discharge. Resolved on own.   Wound cx negative form last ov, but on abx then. Held on incision/drainagebut planned on 48 hr follow up after warm compresses.  Using hot compresses 4-5 times per day.  Improved.  Not as sore, and smaller than before.  Small amount of drainage with some pressure yesterday.  No fever.  Tolerating antibiotics. No new side effects.   Plan on ultrasound +/- mammogram after resolution of current symptoms to rule out underlying cyst/nodule.     Review of Systems As above.      Objective:   Physical Exam  Constitutional: He is oriented to person, place, and time. He appears well-developed and well-nourished.  Pulmonary/Chest: Effort normal. No respiratory distress.    Abdominal: Soft.  Neurological: He is alert and oriented to person, place, and time.  Skin: Skin is warm and dry. No rash noted.  Psychiatric: He has a normal mood and affect. His behavior is normal.       Assessment & Plan:  ADELAIDO NICKLAUS is a 61 y.o. male 1. Cellulitis and abscess of breast    Improved.  Decided against I and D today, but to continue gentle expression of exudate after warm compresses. Will extend flagyl to 10 days - see rx.   Recheck in next 2-3 days of not continuing to improve, then in a few weeks to discuss  ultrasound or MMG of area to rule out underlying abnormality.  Understanding expressed.   At end of visit, questions about COPD diagnosis.  Asked about COPD specialist. I referred to prior visit with Dr. Clelia Croft and plan.  Option of Pulm eval given, but plans on following up with Dr. Clelia Croft 1st. Encouraged to bring any questions to follow up visit with Dr. Clelia Croft for spirometry and med assessment.  Call or rtc sooner if new concerns or questions.

## 2012-01-25 ENCOUNTER — Inpatient Hospital Stay (HOSPITAL_COMMUNITY): Admission: RE | Admit: 2012-01-25 | Payer: Managed Care, Other (non HMO) | Source: Ambulatory Visit

## 2012-01-27 ENCOUNTER — Ambulatory Visit (INDEPENDENT_AMBULATORY_CARE_PROVIDER_SITE_OTHER): Payer: Managed Care, Other (non HMO) | Admitting: Physician Assistant

## 2012-01-27 VITALS — BP 128/84 | HR 97 | Temp 98.3°F | Resp 18 | Ht 68.5 in | Wt 169.8 lb

## 2012-01-27 DIAGNOSIS — N63 Unspecified lump in unspecified breast: Secondary | ICD-10-CM

## 2012-01-27 NOTE — Progress Notes (Signed)
  Subjective:    Patient ID: ESCHER HARR, male    DOB: [birth year], 61 y.o.   MRN: [id number]  HPI See previous OVs.  He is here for a recheck of an abscess that has been draining from his L breast.  It is progressively much less tender.  He is doing warm compresses to the area.  He has a few days left of antibiotics. He denies any fever or chills.  Review of Systems  All other systems reviewed and are negative.       Objective:   Physical Exam  Nursing note and vitals reviewed. Constitutional: He is oriented to person, place, and time. He appears well-developed and well-nourished.  HENT:  Head: Normocephalic and atraumatic.  Neck: Normal range of motion.  Cardiovascular: Normal rate.   Pulmonary/Chest: Effort normal.    Neurological: He is alert and oriented to person, place, and time.  Skin: Skin is warm and dry.  Psychiatric: He has a normal mood and affect. His behavior is normal.       Assessment & Plan:  Breast cyst/resolving cellulitis-agree with previous plan by Dr. Neva Seat to have a mammogram or ultrasound done of this area.  It seems clinically benign.

## 2012-01-29 ENCOUNTER — Other Ambulatory Visit: Payer: Self-pay | Admitting: Family Medicine

## 2012-02-01 ENCOUNTER — Inpatient Hospital Stay: Admit: 2012-02-01 | Payer: Self-pay | Admitting: Orthopedic Surgery

## 2012-02-01 SURGERY — POSTERIOR LUMBAR FUSION 1 LEVEL
Anesthesia: General | Laterality: Left

## 2012-02-06 ENCOUNTER — Telehealth: Payer: Self-pay

## 2012-02-06 NOTE — Telephone Encounter (Signed)
PT SEES DR SHAW. WAS PRESCRIBED MEDICINE AND NEEDS TO KNOW IF HE IS SUPPOSED TO CONTINUE TAKING IT OR COME IN FOR F/U  [phone]

## 2012-02-06 NOTE — Telephone Encounter (Signed)
MESSAGE FOR EVA SHAW. WAS PRESCRIBED MEDICATION AND NEEDS TO KNOW IF HE IS SUPPOSED TO CONTINUE IT OR COME IN FOR A F/U.  [phone]

## 2012-02-06 NOTE — Telephone Encounter (Signed)
Spoke with patient and he just wanted to know when he could come in for f/u with Dr. Clelia Croft. I gave him her hours on Jan 6 and Jan 7. He said he would come in on the 6th.

## 2012-02-06 NOTE — Telephone Encounter (Signed)
Spoke with patient and he just wanted to know when he could come in for f/u with Dr. Shaw. I gave him her hours on Jan 6 and Jan 7. He said he would come in on the 6th.   

## 2012-02-07 HISTORY — PX: COLONOSCOPY: SHX174

## 2012-02-16 ENCOUNTER — Other Ambulatory Visit: Payer: Self-pay

## 2012-02-16 ENCOUNTER — Encounter: Payer: Self-pay | Admitting: Family Medicine

## 2012-02-16 ENCOUNTER — Ambulatory Visit (INDEPENDENT_AMBULATORY_CARE_PROVIDER_SITE_OTHER): Payer: Managed Care, Other (non HMO) | Admitting: Family Medicine

## 2012-02-16 VITALS — BP 140/94 | HR 105 | Temp 98.7°F | Resp 16 | Ht 67.0 in | Wt 174.6 lb

## 2012-02-16 DIAGNOSIS — R05 Cough: Secondary | ICD-10-CM

## 2012-02-16 DIAGNOSIS — R059 Cough, unspecified: Secondary | ICD-10-CM

## 2012-02-16 DIAGNOSIS — N63 Unspecified lump in unspecified breast: Secondary | ICD-10-CM

## 2012-02-16 DIAGNOSIS — R0981 Nasal congestion: Secondary | ICD-10-CM

## 2012-02-16 DIAGNOSIS — J3489 Other specified disorders of nose and nasal sinuses: Secondary | ICD-10-CM

## 2012-02-16 DIAGNOSIS — K219 Gastro-esophageal reflux disease without esophagitis: Secondary | ICD-10-CM

## 2012-02-16 DIAGNOSIS — N6320 Unspecified lump in the left breast, unspecified quadrant: Secondary | ICD-10-CM

## 2012-02-16 DIAGNOSIS — J449 Chronic obstructive pulmonary disease, unspecified: Secondary | ICD-10-CM

## 2012-02-16 DIAGNOSIS — F17200 Nicotine dependence, unspecified, uncomplicated: Secondary | ICD-10-CM

## 2012-02-16 MED ORDER — PREDNISONE 20 MG PO TABS: ORAL_TABLET | ORAL | Status: DC

## 2012-02-16 MED ORDER — ALBUTEROL SULFATE HFA 108 (90 BASE) MCG/ACT IN AERS: 2.0000 | INHALATION_SPRAY | Freq: Four times a day (QID) | RESPIRATORY_TRACT | Status: DC | PRN

## 2012-02-16 MED ORDER — RANITIDINE HCL 150 MG PO TABS: 150.0000 mg | ORAL_TABLET | Freq: Two times a day (BID) | ORAL | Status: DC | PRN

## 2012-02-16 MED ORDER — OMEPRAZOLE 40 MG PO CPDR: 40.0000 mg | DELAYED_RELEASE_CAPSULE | Freq: Every day | ORAL | Status: DC

## 2012-02-16 NOTE — Patient Instructions (Addendum)
Return to clinic in 2 wks and we will repeat your lung testing and see if you need to start another inhaler.  Pay attention to how your breathing is doing while you are on the steroids. I recommend hot showers or breathing in steam may help loosen the congestion.  Try a netti pot or sinus rinse is also likely to help you feel better the most quickly.   Chronic Obstructive Pulmonary Disease Chronic obstructive pulmonary disease (COPD) is a condition in which airflow from the lungs is restricted. The lungs can never return to normal, but there are measures you can take which will improve them and make you feel better. CAUSES   Smoking.  Exposure to secondhand smoke.  Breathing in irritants (pollution, cigarette smoke, strong smells, aerosol sprays, paint fumes).  History of lung infections. TREATMENT  Treatment focuses on making you comfortable (supportive care). Your caregiver may prescribe medications (inhaled or pills) to help improve your breathing. HOME CARE INSTRUCTIONS   If you smoke, stop smoking.  Avoid exposure to smoke, chemicals, and fumes that aggravate your breathing.  Take antibiotic medicines as directed by your caregiver.  Avoid medicines that dry up your system and slow down the elimination of secretions (antihistamines and cough syrups). This decreases respiratory capacity and may lead to infections.  Drink enough water and fluids to keep your urine clear or pale yellow. This loosens secretions.  Use humidifiers at home and at your bedside if they do not make breathing difficult.  Receive all protective vaccines your caregiver suggests, especially pneumococcal and influenza.  Use home oxygen as suggested.  Stay active. Exercise and physical activity will help maintain your ability to do things you want to do.  Eat a healthy diet. SEEK MEDICAL CARE IF:   You develop pus-like mucus (sputum).  Breathing is more labored or exercise becomes difficult to do.  You  are running out of the medicine you take for your breathing. SEEK IMMEDIATE MEDICAL CARE IF:   You have a rapid heart rate.  You have agitation, confusion, tremors, or are in a stupor (family members may need to observe this).  It becomes difficult to breathe.  You develop chest pain.  You have a fever. MAKE SURE YOU:   Understand these instructions.  Will watch your condition.  Will get help right away if you are not doing well or get worse. Document Released: 11/02/2004 Document Revised: 04/17/2011 Document Reviewed: 03/25/2010 [facility] Patient Information 2013 [place], [place].

## 2012-02-29 NOTE — Progress Notes (Signed)
Subjective:    Patient ID: Elijah Marshall, male    DOB: [birth year], 62 y.o.   MRN: [id number] Chief Complaint  Patient presents with  . Follow-up    HPI  He has stopped smoking! He deferred his neck surgery as he has the new diagnosis of COPD and has been on so many antibiotics for his breast abscess - he will reschedule it as soon as he gets surgical clearance from his PCP/me. He has started the spiriva and is doing well on it.  Has not noticed any changes in his lung function. Does not feel SHoB very often but does get winded sometimes with simple things - like going upstairs or out to the mailbox. Never heard about the appt for an [place] of his left breast - is not draining or tender - unsure if it is changing in size, has completed all his abx.  Past Medical History  Diagnosis Date  . Allergy   . Ulcer   . COPD (chronic obstructive pulmonary disease)     per patient's health survey - he put a ?   Current Outpatient Prescriptions on File Prior to Visit  Medication Sig Dispense Refill  . albuterol (PROVENTIL HFA;VENTOLIN HFA) 108 (90 BASE) MCG/ACT inhaler Inhale 2 puffs into the lungs every 6 (six) hours as needed for wheezing.  1 Inhaler  11  . cyclobenzaprine (FLEXERIL) 10 MG tablet Take 10 mg by mouth every 6 (six) hours as needed. For muscle pain      . HYDROcodone-acetaminophen (VICODIN) 5-500 MG per tablet Take 1 tablet by mouth every 6 (six) hours as needed.       Marland Kitchen ipratropium (ATROVENT) 0.06 % nasal spray Place 2 sprays into the nose 2 (two) times daily.      . ranitidine (ZANTAC) 150 MG tablet Take 1 tablet (150 mg total) by mouth 2 (two) times daily as needed for heartburn. For acid reflux  60 tablet  5  . tadalafil (CIALIS) 20 MG tablet Take 20 mg by mouth daily as needed.      . tiotropium (SPIRIVA HANDIHALER) 18 MCG inhalation capsule Place 1 capsule (18 mcg total) into inhaler and inhale daily.  30 capsule  12  . omeprazole (PRILOSEC) 40 MG capsule Take 1 capsule (40 mg  total) by mouth daily.  30 capsule  3   Allergies  Allergen Reactions  . Aspirin Nausea And Vomiting  . Morphine And Related Itching     Review of Systems  Constitutional: Positive for activity change, appetite change and fatigue. Negative for fever and chills.  HENT: Positive for congestion, rhinorrhea and sinus pressure. Negative for ear pain, sore throat, sneezing, trouble swallowing, neck pain, neck stiffness, postnasal drip and ear discharge.   Respiratory: Positive for cough and shortness of breath. Negative for wheezing.   Cardiovascular: Negative for chest pain.  Gastrointestinal: Positive for abdominal pain. Negative for nausea, vomiting, diarrhea, constipation, blood in stool and anal bleeding.  Genitourinary: Negative for dysuria and difficulty urinating.  Musculoskeletal: Positive for arthralgias. Negative for myalgias.  Skin: Negative for color change, rash and wound.  Hematological: Negative for adenopathy.  Psychiatric/Behavioral: Positive for sleep disturbance.      BP 140/94  Pulse 105  Temp 98.7 F (37.1 C) (Oral)  Resp 16  Ht 5\' 7"  (1.702 m)  Wt 174 lb 9.6 oz (79.198 kg)  BMI 27.35 kg/m2  SpO2 97% Objective:   Physical Exam  Constitutional: He is oriented to person, place, and time. He appears  well-developed and well-nourished. No distress.  HENT:  Head: Normocephalic and atraumatic.  Right Ear: External ear and ear canal normal. Tympanic membrane is retracted. A middle ear effusion is present.  Left Ear: External ear and ear canal normal. Tympanic membrane is retracted. A middle ear effusion is present.  Nose: Mucosal edema and rhinorrhea present. Right sinus exhibits maxillary sinus tenderness. Left sinus exhibits maxillary sinus tenderness.  Mouth/Throat: Uvula is midline and mucous membranes are normal. Posterior oropharyngeal erythema present. No oropharyngeal exudate or posterior oropharyngeal edema.  Eyes: Conjunctivae normal are normal. Right eye  exhibits no discharge. Left eye exhibits no discharge. No scleral icterus.  Neck: Normal range of motion. Neck supple. No thyromegaly present.  Cardiovascular: Normal rate, regular rhythm, normal heart sounds and intact distal pulses.   Pulmonary/Chest: Effort normal. No respiratory distress. He has decreased breath sounds. He has no wheezes. He has no rhonchi. He has no rales. Left breast exhibits mass and tenderness. Left breast exhibits no nipple discharge and no skin change. Breasts are asymmetrical.  Lymphadenopathy:       Head (right side): Submandibular adenopathy present.       Head (left side): Submandibular adenopathy present.    He has no cervical adenopathy.       Right: No supraclavicular adenopathy present.       Left: No supraclavicular adenopathy present.  Neurological: He is alert and oriented to person, place, and time.  Skin: Skin is warm and dry. He is not diaphoretic. No erythema.  Psychiatric: He has a normal mood and affect. His behavior is normal.       Assessment & Plan:   1. Nicotine addiction - Doing great!! Stopped smoking   2. COPD (chronic obstructive pulmonary disease)  albuterol (PROVENTIL HFA;VENTOLIN HFA) 108 (90 BASE) MCG/ACT inhaler  3. Cough    4. Tobacco use disorder    5. Sinus congestion  predniSONE (DELTASONE) 20 MG tablet  6. GERD (gastroesophageal reflux disease)  ranitidine (ZANTAC) 150 MG tablet, omeprazole (PRILOSEC) 40 MG capsule  7. Breast mass in male  US Breast Left   Meds ordered this encounter  Medications  . albuterol (PROVENTIL HFA;VENTOLIN HFA) 108 (90 BASE) MCG/ACT inhaler    Sig: Inhale 2 puffs into the lungs every 6 (six) hours as needed for wheezing.    Dispense:  1 Inhaler    Refill:  11  . ranitidine (ZANTAC) 150 MG tablet    Sig: Take 1 tablet (150 mg total) by mouth 2 (two) times daily as needed for heartburn. For acid reflux    Dispense:  60 tablet    Refill:  5  . omeprazole (PRILOSEC) 40 MG capsule    Sig: Take 1  capsule (40 mg total) by mouth daily.    Dispense:  30 capsule    Refill:  3  . predniSONE (DELTASONE) 20 MG tablet    Sig: 3 tabs qd x 3d, 2 tabs qd x 3d, 1 tab qd till out    Dispense:  18 tablet    Refill:  0  Recheck in 2 wks and repeat spirometry at that time - rec to pt that he can sched neck surgery for 3 wks from now, that way if we need to add on an ICS/LABA at f/u in 2 wks, he'll have time to stabalize on it. Offered pt Pulm referral but will hold off for now.  Cons pulm rehab after neck surg. Re-refer for breast US.

## 2012-03-01 ENCOUNTER — Ambulatory Visit (INDEPENDENT_AMBULATORY_CARE_PROVIDER_SITE_OTHER): Payer: Managed Care, Other (non HMO) | Admitting: Family Medicine

## 2012-03-01 VITALS — BP 138/91 | HR 108 | Temp 98.5°F | Resp 18 | Ht 68.0 in | Wt 178.0 lb

## 2012-03-01 DIAGNOSIS — J449 Chronic obstructive pulmonary disease, unspecified: Secondary | ICD-10-CM

## 2012-03-01 DIAGNOSIS — J309 Allergic rhinitis, unspecified: Secondary | ICD-10-CM

## 2012-03-01 DIAGNOSIS — J40 Bronchitis, not specified as acute or chronic: Secondary | ICD-10-CM

## 2012-03-01 DIAGNOSIS — K219 Gastro-esophageal reflux disease without esophagitis: Secondary | ICD-10-CM

## 2012-03-01 LAB — PULMONARY FUNCTION TEST

## 2012-03-01 MED ORDER — CETIRIZINE HCL 10 MG PO TABS: 10.0000 mg | ORAL_TABLET | Freq: Every day | ORAL | Status: DC

## 2012-03-01 NOTE — Progress Notes (Signed)
Subjective:    Patient ID: Elijah Marshall, male    DOB: 03-14-1950, 62 y.o.   MRN: [id number]  HPI  Prednisone did not help congestion, using flonase, did not notice any significant improvement in congestion.  Did try a sinus rinse, helps temporarily. Has used steam treatments which help for a minute.  No f/c but a lot of sinus pressure and jaw pain but main problem is nasal congestion  Stomach hurts - a lot of stress can bring on ulcers.  Is having a lot of back pain still. Pain ins in lumbar causing left hip pain going down left leg.  He has not yet rescheduled his spinal surgery as he wanted to make sure I was ready to medically clear him forst  Pt is going to contact disability - he is currently disabled due to his chronic lumbar spine options but now he has severe COPD and going to have to have a second spine surgery so wants to make sure he is not going to loose his disability.  Past Medical History  Diagnosis Date  . Allergy   . Ulcer   . COPD (chronic obstructive pulmonary disease)     per patient's health survey - he put a ?   Current Outpatient Prescriptions on File Prior to Visit  Medication Sig Dispense Refill  . albuterol (PROVENTIL HFA;VENTOLIN HFA) 108 (90 BASE) MCG/ACT inhaler Inhale 2 puffs into the lungs every 6 (six) hours as needed for wheezing.  1 Inhaler  11  . cyclobenzaprine (FLEXERIL) 10 MG tablet Take 10 mg by mouth every 6 (six) hours as needed. For muscle pain      . HYDROcodone-acetaminophen (VICODIN) 5-500 MG per tablet Take 1 tablet by mouth every 6 (six) hours as needed.       Marland Kitchen ipratropium (ATROVENT) 0.06 % nasal spray Place 2 sprays into the nose 2 (two) times daily.      Marland Kitchen omeprazole (PRILOSEC) 40 MG capsule Take 1 capsule (40 mg total) by mouth daily.  30 capsule  3  . ranitidine (ZANTAC) 150 MG tablet Take 1 tablet (150 mg total) by mouth 2 (two) times daily as needed for heartburn. For acid reflux  60 tablet  5  . tadalafil (CIALIS) 20 MG tablet Take  20 mg by mouth daily as needed.      . tiotropium (SPIRIVA HANDIHALER) 18 MCG inhalation capsule Place 1 capsule (18 mcg total) into inhaler and inhale daily.  30 capsule  12  . cetirizine (ZYRTEC) 10 MG tablet Take 1 tablet (10 mg total) by mouth daily.  30 tablet  11  . predniSONE (DELTASONE) 20 MG tablet 3 tabs qd x 3d, 2 tabs qd x 3d, 1 tab qd till out  18 tablet  0   Allergies  Allergen Reactions  . Aspirin Nausea And Vomiting  . Morphine And Related Itching    Review of Systems  Constitutional: Positive for activity change and fatigue. Negative for fever, chills, diaphoresis, appetite change and unexpected weight change.  HENT: Positive for congestion, rhinorrhea, dental problem, postnasal drip and sinus pressure.   Respiratory: Positive for cough and shortness of breath. Negative for wheezing.   Cardiovascular: Negative for chest pain.  Gastrointestinal: Positive for abdominal pain.  Musculoskeletal: Positive for myalgias, back pain, arthralgias and gait problem. Negative for joint swelling.  Neurological: Positive for weakness. Negative for dizziness, syncope, facial asymmetry and light-headedness.  Hematological: Negative for adenopathy. Does not bruise/bleed easily.  Psychiatric/Behavioral: Negative for dysphoric mood.  BP 138/91  Pulse 108  Temp 98.5 F (36.9 C)  Resp 18  Ht 5\' 8"  (1.727 m)  Wt 178 lb (80.74 kg)  BMI 27.06 kg/m2 Objective:   Physical Exam  Constitutional: He is oriented to person, place, and time. He appears well-developed and well-nourished. No distress.  HENT:  Head: Normocephalic and atraumatic.  Right Ear: External ear and ear canal normal. Tympanic membrane is retracted. A middle ear effusion is present.  Left Ear: External ear and ear canal normal. Tympanic membrane is retracted. A middle ear effusion is present.  Nose: Mucosal edema and rhinorrhea present. Right sinus exhibits maxillary sinus tenderness. Left sinus exhibits maxillary  sinus tenderness.  Mouth/Throat: Uvula is midline and mucous membranes are normal. Posterior oropharyngeal erythema present. No oropharyngeal exudate or posterior oropharyngeal edema.  Eyes: Conjunctivae normal are normal. Right eye exhibits no discharge. Left eye exhibits no discharge. No scleral icterus.  Neck: Normal range of motion. Neck supple. No thyromegaly present.  Cardiovascular: Normal rate, regular rhythm, normal heart sounds and intact distal pulses.   Pulmonary/Chest: Effort normal and breath sounds normal. No respiratory distress.  Lymphadenopathy:       Head (right side): Submandibular adenopathy present.       Head (left side): Submandibular adenopathy present.    He has no cervical adenopathy.       Right: No supraclavicular adenopathy present.       Left: No supraclavicular adenopathy present.  Neurological: He is alert and oriented to person, place, and time.  Skin: Skin is warm and dry. He is not diaphoretic. No erythema.  Psychiatric: He has a normal mood and affect. His behavior is normal.         Office Spirometry Results: Done today and mildly improved from prev  Assessment & Plan:  Pt is disabled because of back initially, but COPD is severe enough I think it would limit him from most jobs he is at. 1. COPD (chronic obstructive pulmonary disease)  Pulmonary function test  1. COPD improved on spiriva although still not ideal. Will hold off an starting ICS/LABA as pt is doing well symptomatically and a lot of his restriction may have to do with his chronic sinus congestion.  2. Chronic sinusitis -  I am surprised that he did not see more significant improvement on prednisone. Start flonase and zyrtec and do netti pot/sinus rinse w/ mucinex and steam. If still having congestion, will rec sinus CT and/or ENT referral.  Will hold off on repeating antibiotic at this time as I do not see any signs or sxs of bacterial infection in congestion. 3. Chronic back pain and  spinal stenosis - OK to go ahead and sched surgery. I think we have optimized pt pulmonary status as well as we can until now and after surgery, I am hoping to get him into pulmonary rehab.   4. Disability due to chronic back pain but now COPD is to the point where I think it would also limit him from any job that he could be trained at.  Pt may need to reapply for his disability and though I do not do disability claims, I support him in this and agree with his claim as his PCP. 5. Left subareolar mass s/p cellulitis/abscess - off antibiotics and not increasing in size or draining though occ painful. Still needs [place] and diagnostic mammogram - have asked CMA to call referrals to find out what the hold-up is - we have been trying to get  this scheduled for about a mo now. Meds ordered this encounter  Medications  . fluticasone (FLONASE) 50 MCG/ACT nasal spray    Sig: Place 2 sprays into the nose daily.  . cetirizine (ZYRTEC) 10 MG tablet    Sig: Take 1 tablet (10 mg total) by mouth daily.    Dispense:  30 tablet    Refill:  11

## 2012-03-10 ENCOUNTER — Encounter: Payer: Self-pay | Admitting: Family Medicine

## 2012-03-18 ENCOUNTER — Telehealth: Payer: Self-pay

## 2012-03-18 NOTE — Telephone Encounter (Signed)
Pt says we need to call his pharmacy to let them know that he needs 3 month supply not 30 day supply he is almost out and needs to get this straight please call him if questions at [phone] [facility]

## 2012-03-19 ENCOUNTER — Ambulatory Visit
Admission: RE | Admit: 2012-03-19 | Discharge: 2012-03-19 | Disposition: A | Discharge status: Home / Self Care | Payer: Managed Care, Other (non HMO) | Source: Ambulatory Visit | Attending: Family Medicine | Admitting: Family Medicine

## 2012-03-19 DIAGNOSIS — N6320 Unspecified lump in the left breast, unspecified quadrant: Secondary | ICD-10-CM

## 2012-03-19 DIAGNOSIS — N63 Unspecified lump in unspecified breast: Secondary | ICD-10-CM

## 2012-03-20 ENCOUNTER — Telehealth: Payer: Self-pay | Admitting: Radiology

## 2012-03-20 DIAGNOSIS — J449 Chronic obstructive pulmonary disease, unspecified: Secondary | ICD-10-CM

## 2012-03-20 MED ORDER — TIOTROPIUM BROMIDE MONOHYDRATE 18 MCG IN CAPS: 18.0000 ug | ORAL_CAPSULE | Freq: Every day | RESPIRATORY_TRACT | Status: DC

## 2012-03-20 NOTE — Telephone Encounter (Signed)
Patient needed his meds changed to 90 day supply, Spiriva. This is changed, Dr Clelia Croft wrote for 30 with 11 refills, changed to 90 with 4 refills. [name]

## 2012-03-27 ENCOUNTER — Encounter: Payer: Self-pay | Admitting: Internal Medicine

## 2012-04-08 ENCOUNTER — Telehealth: Payer: Self-pay

## 2012-04-08 NOTE — Telephone Encounter (Signed)
He is to follow up in April per Dr Clelia Croft. He is transferred to appt scheduling.

## 2012-04-08 NOTE — Telephone Encounter (Signed)
PT WANTS TO TALK WITH DR Clelia Croft AND ASK IF HE IS TO FOLLOW UP.  PLEASE CALL PT TO ADVISE

## 2012-05-10 ENCOUNTER — Encounter: Payer: Self-pay | Admitting: Family Medicine

## 2012-05-10 ENCOUNTER — Ambulatory Visit (INDEPENDENT_AMBULATORY_CARE_PROVIDER_SITE_OTHER): Payer: Managed Care, Other (non HMO) | Admitting: Family Medicine

## 2012-05-10 VITALS — BP 139/97 | HR 106 | Temp 97.1°F | Resp 20 | Ht 67.5 in | Wt 181.0 lb

## 2012-05-10 DIAGNOSIS — H04209 Unspecified epiphora, unspecified lacrimal gland: Secondary | ICD-10-CM

## 2012-05-10 DIAGNOSIS — K219 Gastro-esophageal reflux disease without esophagitis: Secondary | ICD-10-CM

## 2012-05-10 DIAGNOSIS — J449 Chronic obstructive pulmonary disease, unspecified: Secondary | ICD-10-CM

## 2012-05-10 DIAGNOSIS — M5126 Other intervertebral disc displacement, lumbar region: Secondary | ICD-10-CM

## 2012-05-10 DIAGNOSIS — H04203 Unspecified epiphora, bilateral lacrimal glands: Secondary | ICD-10-CM

## 2012-05-10 DIAGNOSIS — Z800 Family history of malignant neoplasm of digestive organs: Secondary | ICD-10-CM

## 2012-05-10 DIAGNOSIS — J329 Chronic sinusitis, unspecified: Secondary | ICD-10-CM

## 2012-05-10 NOTE — Patient Instructions (Signed)
Try articificial tears or saline drops for your eyes. Stop the visine

## 2012-05-10 NOTE — Progress Notes (Addendum)
Subjective:    Patient ID: Elijah Marshall, male    DOB: 09-03-1950, 62 y.o.   MRN: [id number] Chief Complaint  Patient presents with  . Follow-up    HPI  [name]'s back is continuing to really bother him. He can only walk for <1 mi before it starts hurting again, he can't sleep on his back.  Left foot goes numb and feels like he is being stabbed in the hip with a knife.  He has not scheduled his surgery yet - didn't know if his lungs were healthy enough to undergo this.  Eyes watering a lot - alternates, no HAs but eyes burning.  Occ using visine as eyes occ felt heavy and like something was in them.   Breathing is really variable - is completely dependent upon the day if he can walk a block w/o being winded.  His chronic cough is unchanged. It is occ productive. sometimes it is hard to get the sputum up. He has done a great job at stopping smoking!!   Still feeling really congested - just can't get rid of that.  Past Medical History  Diagnosis Date  . Allergy   . Ulcer   . COPD (chronic obstructive pulmonary disease)     per patient's health survey - he put a ?   Current Outpatient Prescriptions on File Prior to Visit  Medication Sig Dispense Refill  . albuterol (PROVENTIL HFA;VENTOLIN HFA) 108 (90 BASE) MCG/ACT inhaler Inhale 2 puffs into the lungs every 6 (six) hours as needed for wheezing.  1 Inhaler  11  . cetirizine (ZYRTEC) 10 MG tablet Take 1 tablet (10 mg total) by mouth daily.  30 tablet  11  . tadalafil (CIALIS) 20 MG tablet Take 20 mg by mouth daily as needed.      . tiotropium (SPIRIVA HANDIHALER) 18 MCG inhalation capsule Place 1 capsule (18 mcg total) into inhaler and inhale daily.  90 capsule  4  . ranitidine (ZANTAC) 150 MG tablet Take 1 tablet (150 mg total) by mouth 2 (two) times daily as needed for heartburn. For acid reflux  60 tablet  5   No current facility-administered medications on file prior to visit.   Allergies  Allergen Reactions  . Aspirin Nausea  And Vomiting  . Morphine And Related Itching   Past Surgical History  Procedure Laterality Date  . Joint replacement  2010  . Spine surgery  2012   Family History  Problem Relation Age of Onset  . Cancer Mother     colon  . Heart disease Father   . Diabetes Sister   . Hypertension Sister   . Hypertension Brother   . Heart disease Maternal Grandmother     heart attack     History   Social History  . Marital Status: Married    Spouse Name: N/A    Number of Children: N/A  . Years of Education: N/A   Occupational History  . truck driver    Social History Main Topics  . Smoking status: Former Smoker    Quit date: 09/24/2011  . Smokeless tobacco: None  . Alcohol Use: Yes     Comment: 2 beers a week  . Drug Use: No  . Sexually Active: Yes    Birth Control/ Protection: None     Comment: number of sex partners in the last 12 months 1   Other Topics Concern  . None   Social History Narrative  . None  Review of Systems  Constitutional: Negative for fever, chills, activity change, appetite change, fatigue and unexpected weight change.  HENT: Positive for ear pain, congestion, rhinorrhea, sneezing, postnasal drip and sinus pressure. Negative for sore throat, trouble swallowing, neck pain, neck stiffness and ear discharge.   Eyes: Positive for pain, discharge and itching. Negative for redness and visual disturbance.  Respiratory: Positive for cough and shortness of breath. Negative for wheezing.   Cardiovascular: Negative for chest pain.  Gastrointestinal: Negative for nausea, vomiting, abdominal pain, diarrhea and constipation.  Genitourinary: Negative for dysuria and difficulty urinating.  Musculoskeletal: Positive for myalgias, back pain, arthralgias and gait problem. Negative for joint swelling.  Neurological: Positive for weakness and numbness. Negative for headaches.  Hematological: Negative for adenopathy.  Psychiatric/Behavioral: Positive for sleep  disturbance.      BP 139/97  Pulse 106  Temp(Src) 97.1 F (36.2 C)  Resp 20  Ht 5' 7.5" (1.715 m)  Wt 181 lb (82.101 kg)  BMI 27.91 kg/m2  SpO2 96% Objective:   Physical Exam  Constitutional: He is oriented to person, place, and time. He appears well-developed and well-nourished. No distress.  HENT:  Head: Normocephalic and atraumatic.  Eyes: Conjunctivae are normal. Pupils are equal, round, and reactive to light. No scleral icterus.  Neck: Normal range of motion. Neck supple. No thyromegaly present.  Cardiovascular: Normal rate, regular rhythm, normal heart sounds and intact distal pulses.   Pulmonary/Chest: Effort normal and breath sounds normal. No respiratory distress.  Musculoskeletal: He exhibits no edema.  strength 4+/5 on left leg, 5/5 on right let  Lymphadenopathy:    He has no cervical adenopathy.  Neurological: He is alert and oriented to person, place, and time.  Skin: Skin is warm and dry. He is not diaphoretic.  Psychiatric: He has a normal mood and affect. His behavior is normal.      Assessment & Plan:  Chronic sinusitis - I think that this is actually causing a lot of pt's discomfort. He has had several courses of steroids and antibiotics but sxs continue. Cont zyrtec and flonase. Will refer to ENT for eval.  COPD (chronic obstructive pulmonary disease) - rec repeat PFTs AFTER sinuses are treated to see if pt needs ICS. Pt will need pulmonary rehab after he has his back surgery. Needs pneumovax at next OV  Excessive lacrimation, bilateral - stop visine and try artificial tears or saline drops.  Lumbar herniated disc - Rec pt proceed with surgery - his pain and weakness is really limiting him and becoming more weak will likely result in worsening copd sxs so better to do sooner rather than later.  Pt is going to continue chronic disability which I support him with - he was initially disabled due to his back problems so needs to continue to f/u with his  neurosurgeon for these recs but now he has severe COPD as well so encouraged pt to consider re-applying for disability though we do not do this here - he would have to contact social services or be referred to pulmonology.  HM - +fam hx of colon cancer and no prior h/o colonoscopy. Needs GI referral for colonoscopy. Pt reports TDaP in 2012 - need to get into Epic at f/u. Needs FLP and fasting glucose at f/u.

## 2012-05-15 MED ORDER — FLUTICASONE PROPIONATE 50 MCG/ACT NA SUSP: 2.0000 | Freq: Every day | NASAL | Status: DC

## 2012-05-15 MED ORDER — OMEPRAZOLE 40 MG PO CPDR: 40.0000 mg | DELAYED_RELEASE_CAPSULE | Freq: Every day | ORAL | Status: DC

## 2012-05-15 NOTE — Addendum Note (Signed)
Addended by: Norberto Sorenson on: 05/15/2012 02:24 PM   Modules accepted: Orders, Medications

## 2012-05-20 ENCOUNTER — Other Ambulatory Visit: Payer: Self-pay

## 2012-05-20 DIAGNOSIS — K219 Gastro-esophageal reflux disease without esophagitis: Secondary | ICD-10-CM

## 2012-05-20 MED ORDER — OMEPRAZOLE 40 MG PO CPDR: 40.0000 mg | DELAYED_RELEASE_CAPSULE | Freq: Every day | ORAL | Status: DC

## 2012-07-04 ENCOUNTER — Other Ambulatory Visit: Payer: Self-pay | Admitting: Orthopedic Surgery

## 2012-07-15 ENCOUNTER — Encounter (HOSPITAL_COMMUNITY): Payer: Self-pay | Admitting: Pharmacy Technician

## 2012-07-17 NOTE — Pre-Procedure Instructions (Addendum)
Elijah Marshall  07/17/2012   Your procedure is scheduled on:  07/25/12  Report to [facility] at 530AM.  Call this number if you have problems the morning of surgery: [phone]   Remember:   Do not eat food or drink liquids after midnight.   Take these medicines the morning of surgery with A SIP OF WATER: inhalers,flexeril,pain med, omeprazole   Do not wear jewelry, make-up or nail polish.  Do not wear lotions, powders, or perfumes. You may wear deodorant.  Do not shave 48 hours prior to surgery. Men may shave face and neck.  Do not bring valuables to the hospital.  [facility] is not responsible                   for any belongings or valuables.  Contacts, dentures or bridgework may not be worn into surgery.  Leave suitcase in the car. After surgery it may be brought to your room.  For patients admitted to the hospital, checkout time is 11:00 AM the day of  discharge.   Patients discharged the day of surgery will not be allowed to drive  home.  Name and phone number of your driver:   Special Instructions: Incentive Spirometry - Practice and bring it with you on the day of surgery. Shower using CHG 2 nights before surgery and the night before surgery.  If you shower the day of surgery use CHG.  Use special wash - you have one bottle of CHG for all showers.  You should use approximately 1/3 of the bottle for each shower.   Please read over the following fact sheets that you were given: Pain Booklet, Coughing and Deep Breathing, Blood Transfusion Information, MRSA Information and Surgical Site Infection Prevention

## 2012-07-18 ENCOUNTER — Encounter (HOSPITAL_COMMUNITY)
Admission: RE | Admit: 2012-07-18 | Discharge: 2012-07-18 | Disposition: A | Discharge status: Home / Self Care | Payer: Managed Care, Other (non HMO) | Source: Ambulatory Visit | Attending: Orthopedic Surgery | Admitting: Orthopedic Surgery

## 2012-07-18 ENCOUNTER — Encounter (HOSPITAL_COMMUNITY): Payer: Self-pay

## 2012-07-18 HISTORY — DX: Gastro-esophageal reflux disease without esophagitis: K21.9

## 2012-07-18 HISTORY — DX: Unspecified osteoarthritis, unspecified site: M19.90

## 2012-07-18 LAB — CBC WITH DIFFERENTIAL/PLATELET
Basophils Absolute: 0 10*3/uL (ref 0.0–0.1)
Basophils Relative: 1 % (ref 0–1)
Eosinophils Absolute: 0.1 10*3/uL (ref 0.0–0.7)
Eosinophils Relative: 2 % (ref 0–5)
HCT: 48.8 % (ref 39.0–52.0)
Hemoglobin: 17.3 g/dL — ABNORMAL HIGH (ref 13.0–17.0)
Lymphocytes Relative: 40 % (ref 12–46)
Lymphs Abs: 2 10*3/uL (ref 0.7–4.0)
MCH: 30.6 pg (ref 26.0–34.0)
MCHC: 35.5 g/dL (ref 30.0–36.0)
MCV: 86.2 fL (ref 78.0–100.0)
Monocytes Absolute: 0.5 10*3/uL (ref 0.1–1.0)
Monocytes Relative: 10 % (ref 3–12)
Neutro Abs: 2.4 10*3/uL (ref 1.7–7.7)
Neutrophils Relative %: 48 % (ref 43–77)
Platelets: 197 10*3/uL (ref 150–400)
RBC: 5.66 MIL/uL (ref 4.22–5.81)
RDW: 13 % (ref 11.5–15.5)
WBC: 5.1 10*3/uL (ref 4.0–10.5)

## 2012-07-18 LAB — TYPE AND SCREEN
ABO/RH(D): A POS
Antibody Screen: NEGATIVE

## 2012-07-18 LAB — COMPREHENSIVE METABOLIC PANEL
ALT: 20 U/L (ref 0–53)
AST: 26 U/L (ref 0–37)
Albumin: 4.1 g/dL (ref 3.5–5.2)
Alkaline Phosphatase: 105 U/L (ref 39–117)
BUN: 5 mg/dL — ABNORMAL LOW (ref 6–23)
CO2: 26 mEq/L (ref 19–32)
Calcium: 9.5 mg/dL (ref 8.4–10.5)
Chloride: 101 mEq/L (ref 96–112)
Creatinine, Ser: 0.98 mg/dL (ref 0.50–1.35)
GFR calc Af Amer: >90 mL/min (ref >90)
GFR calc non Af Amer: 87 mL/min — ABNORMAL LOW (ref >90)
Glucose, Bld: 102 mg/dL — ABNORMAL HIGH (ref 70–99)
Potassium: 3.7 mEq/L (ref 3.5–5.1)
Sodium: 137 mEq/L (ref 135–145)
Total Bilirubin: 0.4 mg/dL (ref 0.3–1.2)
Total Protein: 7.5 g/dL (ref 6.0–8.3)

## 2012-07-18 LAB — URINALYSIS, ROUTINE W REFLEX MICROSCOPIC
Bilirubin Urine: NEGATIVE
Glucose, UA: NEGATIVE mg/dL
Hgb urine dipstick: NEGATIVE
Ketones, ur: NEGATIVE mg/dL
Leukocytes, UA: NEGATIVE
Nitrite: NEGATIVE
Protein, ur: NEGATIVE mg/dL
Specific Gravity, Urine: 1.011 (ref 1.005–1.030)
Urobilinogen, UA: 0.2 mg/dL (ref 0.0–1.0)
pH: 6 (ref 5.0–8.0)

## 2012-07-18 LAB — APTT: aPTT: 31 seconds (ref 24–37)

## 2012-07-18 LAB — PROTIME-INR
INR: 0.98 (ref 0.00–1.49)
Prothrombin Time: 12.9 seconds (ref 11.6–15.2)

## 2012-07-18 LAB — SURGICAL PCR SCREEN
MRSA, PCR: NEGATIVE
Staphylococcus aureus: NEGATIVE

## 2012-07-24 MED ORDER — CEFAZOLIN SODIUM-DEXTROSE 2-3 GM-% IV SOLR
2.0000 g | INTRAVENOUS | Status: AC
  Administered 2012-07-25: 2 g via INTRAVENOUS
  Filled 2012-07-24: qty 50

## 2012-07-25 ENCOUNTER — Encounter (HOSPITAL_COMMUNITY)
Admission: RE | Disposition: A | Discharge status: Home / Self Care | Payer: Self-pay | Source: Ambulatory Visit | Attending: Orthopedic Surgery

## 2012-07-25 ENCOUNTER — Ambulatory Visit (HOSPITAL_COMMUNITY): Payer: Managed Care, Other (non HMO) | Admitting: Certified Registered"

## 2012-07-25 ENCOUNTER — Inpatient Hospital Stay (HOSPITAL_COMMUNITY)
Admission: RE | Admit: 2012-07-25 | Discharge: 2012-07-28 | DRG: 459 | Disposition: A | Discharge status: Home / Self Care | Payer: Managed Care, Other (non HMO) | Source: Ambulatory Visit | Attending: Orthopedic Surgery | Admitting: Orthopedic Surgery

## 2012-07-25 ENCOUNTER — Encounter (HOSPITAL_COMMUNITY): Payer: Self-pay | Admitting: *Deleted

## 2012-07-25 ENCOUNTER — Encounter (HOSPITAL_COMMUNITY): Payer: Self-pay | Admitting: Certified Registered"

## 2012-07-25 ENCOUNTER — Ambulatory Visit (HOSPITAL_COMMUNITY): Payer: Managed Care, Other (non HMO)

## 2012-07-25 DIAGNOSIS — R0602 Shortness of breath: Secondary | ICD-10-CM

## 2012-07-25 DIAGNOSIS — K219 Gastro-esophageal reflux disease without esophagitis: Secondary | ICD-10-CM | POA: Diagnosis present

## 2012-07-25 DIAGNOSIS — Z01818 Encounter for other preprocedural examination: Secondary | ICD-10-CM

## 2012-07-25 DIAGNOSIS — Z87891 Personal history of nicotine dependence: Secondary | ICD-10-CM

## 2012-07-25 DIAGNOSIS — J189 Pneumonia, unspecified organism: Secondary | ICD-10-CM | POA: Diagnosis not present

## 2012-07-25 DIAGNOSIS — R0902 Hypoxemia: Secondary | ICD-10-CM | POA: Diagnosis present

## 2012-07-25 DIAGNOSIS — Y95 Nosocomial condition: Secondary | ICD-10-CM

## 2012-07-25 DIAGNOSIS — Z01812 Encounter for preprocedural laboratory examination: Secondary | ICD-10-CM

## 2012-07-25 DIAGNOSIS — J449 Chronic obstructive pulmonary disease, unspecified: Secondary | ICD-10-CM

## 2012-07-25 DIAGNOSIS — J309 Allergic rhinitis, unspecified: Secondary | ICD-10-CM | POA: Diagnosis present

## 2012-07-25 DIAGNOSIS — Z01810 Encounter for preprocedural cardiovascular examination: Secondary | ICD-10-CM

## 2012-07-25 DIAGNOSIS — M129 Arthropathy, unspecified: Secondary | ICD-10-CM | POA: Diagnosis present

## 2012-07-25 DIAGNOSIS — F17200 Nicotine dependence, unspecified, uncomplicated: Secondary | ICD-10-CM

## 2012-07-25 DIAGNOSIS — J441 Chronic obstructive pulmonary disease with (acute) exacerbation: Secondary | ICD-10-CM | POA: Diagnosis not present

## 2012-07-25 DIAGNOSIS — M4800 Spinal stenosis, site unspecified: Principal | ICD-10-CM | POA: Diagnosis present

## 2012-07-25 HISTORY — PX: ANTERIOR LAT LUMBAR FUSION: SHX1168

## 2012-07-25 HISTORY — PX: ANTERIOR FUSION LUMBAR SPINE: SUR629

## 2012-07-25 HISTORY — DX: Shortness of breath: R06.02

## 2012-07-25 SURGERY — ANTERIOR LATERAL LUMBAR FUSION 1 LEVEL
Anesthesia: General | Site: Spine Lumbar | Laterality: Left | Wound class: Clean

## 2012-07-25 MED ORDER — SODIUM CHLORIDE 0.9 % IV SOLN
10.0000 mg | INTRAVENOUS | Status: DC | PRN
  Administered 2012-07-25: 50 ug/min via INTRAVENOUS

## 2012-07-25 MED ORDER — SODIUM CHLORIDE 0.9 % IJ SOLN
3.0000 mL | Freq: Two times a day (BID) | INTRAMUSCULAR | Status: DC
  Administered 2012-07-25 – 2012-07-27 (×2): 3 mL via INTRAVENOUS

## 2012-07-25 MED ORDER — SODIUM CHLORIDE 0.9 % IV SOLN
INTRAVENOUS | Status: DC
  Administered 2012-07-25 – 2012-07-26 (×2): via INTRAVENOUS

## 2012-07-25 MED ORDER — DIPHENHYDRAMINE HCL 25 MG PO CAPS: 25.0000 mg | ORAL_CAPSULE | Freq: Four times a day (QID) | ORAL | Status: DC | PRN

## 2012-07-25 MED ORDER — DIPHENHYDRAMINE HCL 50 MG/ML IJ SOLN: 12.5000 mg | Freq: Four times a day (QID) | INTRAMUSCULAR | Status: DC | PRN

## 2012-07-25 MED ORDER — OXYCODONE HCL 5 MG PO TABS: 5.0000 mg | ORAL_TABLET | Freq: Once | ORAL | Status: DC | PRN

## 2012-07-25 MED ORDER — SODIUM CHLORIDE 0.9 % IJ SOLN: 9.0000 mL | INTRAMUSCULAR | Status: DC | PRN

## 2012-07-25 MED ORDER — SODIUM CHLORIDE 0.9 % IJ SOLN
3.0000 mL | INTRAMUSCULAR | Status: DC | PRN
  Administered 2012-07-27: 3 mL via INTRAVENOUS

## 2012-07-25 MED ORDER — HYDROMORPHONE HCL PF 1 MG/ML IJ SOLN
0.5000 mg | INTRAMUSCULAR | Status: DC | PRN
  Filled 2012-07-25: qty 1

## 2012-07-25 MED ORDER — FLEET ENEMA 7-19 GM/118ML RE ENEM
1.0000 | ENEMA | Freq: Once | RECTAL | Status: AC | PRN
  Filled 2012-07-25: qty 1

## 2012-07-25 MED ORDER — ALUM & MAG HYDROXIDE-SIMETH 200-200-20 MG/5ML PO SUSP
30.0000 mL | Freq: Four times a day (QID) | ORAL | Status: DC | PRN
  Administered 2012-07-28: 30 mL via ORAL
  Filled 2012-07-25: qty 30

## 2012-07-25 MED ORDER — ZOLPIDEM TARTRATE 5 MG PO TABS: 5.0000 mg | ORAL_TABLET | Freq: Every evening | ORAL | Status: DC | PRN

## 2012-07-25 MED ORDER — MEPERIDINE HCL 25 MG/ML IJ SOLN: 6.2500 mg | INTRAMUSCULAR | Status: DC | PRN

## 2012-07-25 MED ORDER — ONDANSETRON HCL 4 MG/2ML IJ SOLN: 4.0000 mg | INTRAMUSCULAR | Status: DC | PRN

## 2012-07-25 MED ORDER — ARTIFICIAL TEARS OP OINT
TOPICAL_OINTMENT | OPHTHALMIC | Status: DC | PRN
  Administered 2012-07-25: 1 via OPHTHALMIC

## 2012-07-25 MED ORDER — SODIUM CHLORIDE 0.9 % IV SOLN: 250.0000 mL | INTRAVENOUS | Status: DC

## 2012-07-25 MED ORDER — FENTANYL CITRATE 0.05 MG/ML IJ SOLN
INTRAMUSCULAR | Status: DC | PRN
  Administered 2012-07-25 (×2): 50 ug via INTRAVENOUS
  Administered 2012-07-25: 100 ug via INTRAVENOUS
  Administered 2012-07-25: 150 ug via INTRAVENOUS

## 2012-07-25 MED ORDER — MIDAZOLAM HCL 2 MG/2ML IJ SOLN: 0.5000 mg | Freq: Once | INTRAMUSCULAR | Status: DC | PRN

## 2012-07-25 MED ORDER — ACETAMINOPHEN 325 MG PO TABS: 650.0000 mg | ORAL_TABLET | ORAL | Status: DC | PRN

## 2012-07-25 MED ORDER — TIOTROPIUM BROMIDE MONOHYDRATE 18 MCG IN CAPS
18.0000 ug | ORAL_CAPSULE | Freq: Every day | RESPIRATORY_TRACT | Status: DC
  Administered 2012-07-26 – 2012-07-28 (×3): 18 ug via RESPIRATORY_TRACT
  Filled 2012-07-25: qty 5

## 2012-07-25 MED ORDER — PROMETHAZINE HCL 25 MG/ML IJ SOLN: 6.2500 mg | INTRAMUSCULAR | Status: DC | PRN

## 2012-07-25 MED ORDER — MENTHOL 3 MG MT LOZG: 1.0000 | LOZENGE | OROMUCOSAL | Status: DC | PRN

## 2012-07-25 MED ORDER — PHENYLEPHRINE HCL 10 MG/ML IJ SOLN
INTRAMUSCULAR | Status: DC | PRN
  Administered 2012-07-25: 80 ug via INTRAVENOUS
  Administered 2012-07-25: 200 ug via INTRAVENOUS
  Administered 2012-07-25: 160 ug via INTRAVENOUS
  Administered 2012-07-25: 120 ug via INTRAVENOUS
  Administered 2012-07-25: 160 ug via INTRAVENOUS
  Administered 2012-07-25: 80 ug via INTRAVENOUS

## 2012-07-25 MED ORDER — POVIDONE-IODINE 7.5 % EX SOLN
Freq: Once | CUTANEOUS | Status: DC
  Filled 2012-07-25: qty 118

## 2012-07-25 MED ORDER — 0.9 % SODIUM CHLORIDE (POUR BTL) OPTIME
TOPICAL | Status: DC | PRN
  Administered 2012-07-25: 1000 mL

## 2012-07-25 MED ORDER — HYDROMORPHONE HCL PF 1 MG/ML IJ SOLN
INTRAMUSCULAR | Status: AC
  Filled 2012-07-25: qty 1

## 2012-07-25 MED ORDER — LIDOCAINE HCL (CARDIAC) 20 MG/ML IV SOLN
INTRAVENOUS | Status: DC | PRN
  Administered 2012-07-25: 20 mg via INTRAVENOUS

## 2012-07-25 MED ORDER — PNEUMOCOCCAL VAC POLYVALENT 25 MCG/0.5ML IJ INJ
0.5000 mL | INJECTION | INTRAMUSCULAR | Status: AC
  Administered 2012-07-26: 0.5 mL via INTRAMUSCULAR
  Filled 2012-07-25: qty 0.5

## 2012-07-25 MED ORDER — CEFAZOLIN SODIUM 1-5 GM-% IV SOLN
INTRAVENOUS | Status: AC
  Filled 2012-07-25: qty 50

## 2012-07-25 MED ORDER — FLUTICASONE PROPIONATE 50 MCG/ACT NA SUSP
2.0000 | Freq: Every day | NASAL | Status: DC
  Administered 2012-07-26 – 2012-07-28 (×4): 2 via NASAL
  Filled 2012-07-25: qty 16

## 2012-07-25 MED ORDER — HYDROMORPHONE HCL PF 1 MG/ML IJ SOLN
0.2500 mg | INTRAMUSCULAR | Status: DC | PRN
  Administered 2012-07-25 (×4): 0.5 mg via INTRAVENOUS

## 2012-07-25 MED ORDER — DOCUSATE SODIUM 100 MG PO CAPS
100.0000 mg | ORAL_CAPSULE | Freq: Two times a day (BID) | ORAL | Status: DC
  Administered 2012-07-25 – 2012-07-28 (×6): 100 mg via ORAL
  Filled 2012-07-25 (×5): qty 1

## 2012-07-25 MED ORDER — ALBUTEROL SULFATE HFA 108 (90 BASE) MCG/ACT IN AERS: 2.0000 | INHALATION_SPRAY | Freq: Four times a day (QID) | RESPIRATORY_TRACT | Status: DC | PRN

## 2012-07-25 MED ORDER — ACETAMINOPHEN 650 MG RE SUPP: 650.0000 mg | RECTAL | Status: DC | PRN

## 2012-07-25 MED ORDER — HYDROMORPHONE 0.3 MG/ML IV SOLN
INTRAVENOUS | Status: DC
  Administered 2012-07-25 (×2): 0.9 mg via INTRAVENOUS
  Administered 2012-07-25: 2.4 mg via INTRAVENOUS
  Administered 2012-07-26: 1.5 mg via INTRAVENOUS
  Administered 2012-07-26: 2.7 mg via INTRAVENOUS
  Administered 2012-07-26: 02:00:00 via INTRAVENOUS
  Filled 2012-07-25: qty 25

## 2012-07-25 MED ORDER — CEFAZOLIN SODIUM 1-5 GM-% IV SOLN
1.0000 g | Freq: Three times a day (TID) | INTRAVENOUS | Status: AC
  Administered 2012-07-25 (×2): 1 g via INTRAVENOUS
  Filled 2012-07-25: qty 50

## 2012-07-25 MED ORDER — LORATADINE 10 MG PO TABS
10.0000 mg | ORAL_TABLET | Freq: Every day | ORAL | Status: DC
  Administered 2012-07-25 – 2012-07-28 (×4): 10 mg via ORAL
  Filled 2012-07-25 (×4): qty 1

## 2012-07-25 MED ORDER — OXYCODONE HCL 5 MG/5ML PO SOLN: 5.0000 mg | Freq: Once | ORAL | Status: DC | PRN

## 2012-07-25 MED ORDER — BUPIVACAINE-EPINEPHRINE PF 0.25-1:200000 % IJ SOLN
INTRAMUSCULAR | Status: AC
  Filled 2012-07-25: qty 30

## 2012-07-25 MED ORDER — PROPOFOL INFUSION 10 MG/ML OPTIME
INTRAVENOUS | Status: DC | PRN
  Administered 2012-07-25: 75 ug/kg/min via INTRAVENOUS

## 2012-07-25 MED ORDER — MIDAZOLAM HCL 5 MG/5ML IJ SOLN
INTRAMUSCULAR | Status: DC | PRN
  Administered 2012-07-25: 2 mg via INTRAVENOUS

## 2012-07-25 MED ORDER — NALOXONE HCL 0.4 MG/ML IJ SOLN: 0.4000 mg | INTRAMUSCULAR | Status: DC | PRN

## 2012-07-25 MED ORDER — PROPOFOL 10 MG/ML IV BOLUS
INTRAVENOUS | Status: DC | PRN
  Administered 2012-07-25: 200 mg via INTRAVENOUS
  Administered 2012-07-25: 100 mg via INTRAVENOUS

## 2012-07-25 MED ORDER — BUPIVACAINE-EPINEPHRINE 0.25% -1:200000 IJ SOLN
INTRAMUSCULAR | Status: DC | PRN
  Administered 2012-07-25: 6 mL

## 2012-07-25 MED ORDER — DIAZEPAM 5 MG PO TABS
5.0000 mg | ORAL_TABLET | Freq: Four times a day (QID) | ORAL | Status: DC | PRN
  Administered 2012-07-28: 5 mg via ORAL
  Filled 2012-07-25: qty 1

## 2012-07-25 MED ORDER — PANTOPRAZOLE SODIUM 40 MG PO TBEC
40.0000 mg | DELAYED_RELEASE_TABLET | Freq: Every day | ORAL | Status: DC
  Administered 2012-07-26 – 2012-07-27 (×3): 40 mg via ORAL
  Filled 2012-07-25 (×3): qty 1

## 2012-07-25 MED ORDER — LACTATED RINGERS IV SOLN
INTRAVENOUS | Status: DC | PRN
  Administered 2012-07-25 (×2): via INTRAVENOUS

## 2012-07-25 MED ORDER — SENNOSIDES-DOCUSATE SODIUM 8.6-50 MG PO TABS: 1.0000 | ORAL_TABLET | Freq: Every evening | ORAL | Status: DC | PRN

## 2012-07-25 MED ORDER — ONDANSETRON HCL 4 MG/2ML IJ SOLN: 4.0000 mg | Freq: Four times a day (QID) | INTRAMUSCULAR | Status: DC | PRN

## 2012-07-25 MED ORDER — DIPHENHYDRAMINE HCL 12.5 MG/5ML PO ELIX: 12.5000 mg | ORAL_SOLUTION | Freq: Four times a day (QID) | ORAL | Status: DC | PRN

## 2012-07-25 MED ORDER — BISACODYL 5 MG PO TBEC: 5.0000 mg | DELAYED_RELEASE_TABLET | Freq: Every day | ORAL | Status: DC | PRN

## 2012-07-25 MED ORDER — HYDROMORPHONE 0.3 MG/ML IV SOLN
INTRAVENOUS | Status: AC
  Filled 2012-07-25: qty 25

## 2012-07-25 MED ORDER — THROMBIN 20000 UNITS EX SOLR
CUTANEOUS | Status: AC
  Filled 2012-07-25: qty 40000

## 2012-07-25 MED ORDER — PHENOL 1.4 % MT LIQD: 1.0000 | OROMUCOSAL | Status: DC | PRN

## 2012-07-25 MED ORDER — SUCCINYLCHOLINE CHLORIDE 20 MG/ML IJ SOLN
INTRAMUSCULAR | Status: DC | PRN
  Administered 2012-07-25: 100 mg via INTRAVENOUS
  Administered 2012-07-25: 80 mg via INTRAVENOUS

## 2012-07-25 MED ORDER — ONDANSETRON HCL 4 MG/2ML IJ SOLN
INTRAMUSCULAR | Status: DC | PRN
  Administered 2012-07-25: 4 mg via INTRAVENOUS

## 2012-07-25 MED ORDER — THROMBIN 20000 UNITS EX SOLR
OROMUCOSAL | Status: DC | PRN
  Administered 2012-07-25: 09:00:00 via TOPICAL

## 2012-07-25 MED ORDER — OXYCODONE-ACETAMINOPHEN 5-325 MG PO TABS
1.0000 | ORAL_TABLET | ORAL | Status: DC | PRN
  Administered 2012-07-26 – 2012-07-28 (×9): 2 via ORAL
  Filled 2012-07-25 (×9): qty 2

## 2012-07-25 SURGICAL SUPPLY — 77 items
APL SKNCLS STERI-STRIP NONHPOA (GAUZE/BANDAGES/DRESSINGS) ×1
BENZOIN TINCTURE PRP APPL 2/3 (GAUZE/BANDAGES/DRESSINGS) ×1 IMPLANT
BLADE SURG 10 STRL SS (BLADE) ×2 IMPLANT
BLADE SURG ROTATE 9660 (MISCELLANEOUS) IMPLANT
CLOTH BEACON ORANGE TIMEOUT ST (SAFETY) ×2 IMPLANT
CORDS BIPOLAR (ELECTRODE) ×2 IMPLANT
COROENT XL 12X22X55 (Orthopedic Implant) ×1 IMPLANT
COVER SURGICAL LIGHT HANDLE (MISCELLANEOUS) ×2 IMPLANT
DRAPE C-ARM 42X72 X-RAY (DRAPES) ×2 IMPLANT
DRAPE C-ARMOR (DRAPES) ×1 IMPLANT
DRAPE INCISE IOBAN 66X45 STRL (DRAPES) ×1 IMPLANT
DRAPE POUCH INSTRU U-SHP 10X18 (DRAPES) ×2 IMPLANT
DRAPE SURG 17X23 STRL (DRAPES) ×5 IMPLANT
DRAPE TABLE COVER HEAVY DUTY (DRAPES) ×1 IMPLANT
DRAPE U-SHAPE 47X51 STRL (DRAPES) ×1 IMPLANT
DRSG MEPILEX BORDER 4X8 (GAUZE/BANDAGES/DRESSINGS) ×1 IMPLANT
DURAPREP 26ML APPLICATOR (WOUND CARE) ×2 IMPLANT
ELECT BLADE 4.0 EZ CLEAN MEGAD (MISCELLANEOUS) ×2
ELECT BLADE 6.5 EXT (BLADE) ×2 IMPLANT
ELECT CAUTERY BLADE 6.4 (BLADE) ×2 IMPLANT
ELECT REM PT RETURN 9FT ADLT (ELECTROSURGICAL) ×2
ELECTRODE BLDE 4.0 EZ CLN MEGD (MISCELLANEOUS) IMPLANT
ELECTRODE REM PT RTRN 9FT ADLT (ELECTROSURGICAL) ×1 IMPLANT
GAUZE SPONGE 4X4 16PLY XRAY LF (GAUZE/BANDAGES/DRESSINGS) ×2 IMPLANT
GLOVE BIO SURGEON STRL SZ7 (GLOVE) ×4 IMPLANT
GLOVE BIO SURGEON STRL SZ8 (GLOVE) ×3 IMPLANT
GLOVE BIOGEL PI IND STRL 7.0 (GLOVE) ×1 IMPLANT
GLOVE BIOGEL PI IND STRL 8 (GLOVE) ×1 IMPLANT
GLOVE BIOGEL PI INDICATOR 7.0 (GLOVE) ×1
GLOVE BIOGEL PI INDICATOR 8 (GLOVE) ×1
GLOVE BIOGEL PI ORTHO PRO 7.5 (GLOVE) ×1
GLOVE PI ORTHO PRO STRL 7.5 (GLOVE) IMPLANT
GLOVE SURG SS PI 7.5 STRL IVOR (GLOVE) ×1 IMPLANT
GOWN STRL NON-REIN LRG LVL3 (GOWN DISPOSABLE) ×2 IMPLANT
GOWN STRL REIN XL XLG (GOWN DISPOSABLE) ×4 IMPLANT
IV CATH 14GX2 1/4 (CATHETERS) ×1 IMPLANT
KIT BASIN OR (CUSTOM PROCEDURE TRAY) ×2 IMPLANT
KIT DILATOR XLIF 5 (KITS) IMPLANT
KIT MAXCESS (KITS) ×1 IMPLANT
KIT NDL NVM5 EMG ELECT (KITS) IMPLANT
KIT NEEDLE NVM5 EMG ELECT (KITS) ×1 IMPLANT
KIT NEEDLE NVM5 EMG ELECTRODE (KITS) ×1
KIT ROOM TURNOVER OR (KITS) ×2 IMPLANT
KIT XLIF (KITS) ×1
NDL HYPO 25GX1X1/2 BEV (NEEDLE) ×1 IMPLANT
NDL SPNL 18GX3.5 QUINCKE PK (NEEDLE) ×1 IMPLANT
NEEDLE HYPO 25GX1X1/2 BEV (NEEDLE) ×2 IMPLANT
NEEDLE SPNL 18GX3.5 QUINCKE PK (NEEDLE) ×2 IMPLANT
NS IRRIG 1000ML POUR BTL (IV SOLUTION) ×2 IMPLANT
PACK LAMINECTOMY ORTHO (CUSTOM PROCEDURE TRAY) ×2 IMPLANT
PACK UNIVERSAL I (CUSTOM PROCEDURE TRAY) ×2 IMPLANT
PAD ARMBOARD 7.5X6 YLW CONV (MISCELLANEOUS) ×5 IMPLANT
PLATE 2H 10MM (Plate) ×1 IMPLANT
PUTTY BONE DBX 2.5 MIS (Bone Implant) ×1 IMPLANT
PUTTY BONE DBX 5CC MIX (Putty) ×1 IMPLANT
SCREW DECADE 5.5X50 (Screw) ×2 IMPLANT
SPONGE GAUZE 4X4 12PLY (GAUZE/BANDAGES/DRESSINGS) ×1 IMPLANT
SPONGE INTESTINAL PEANUT (DISPOSABLE) ×4 IMPLANT
SPONGE LAP 4X18 X RAY DECT (DISPOSABLE) ×2 IMPLANT
SPONGE SURGIFOAM ABS GEL 100 (HEMOSTASIS) ×1 IMPLANT
STAPLER VISISTAT 35W (STAPLE) ×1 IMPLANT
STRIP CLOSURE SKIN 1/2X4 (GAUZE/BANDAGES/DRESSINGS) ×2 IMPLANT
SURGIFLO TRUKIT (HEMOSTASIS) IMPLANT
SUT MNCRL AB 4-0 PS2 18 (SUTURE) ×2 IMPLANT
SUT PDS AB 1 CTX 36 (SUTURE) ×2 IMPLANT
SUT PROLENE 5 0 C 1 24 (SUTURE) IMPLANT
SUT SILK 2 0 TIES 10X30 (SUTURE) ×1 IMPLANT
SUT VIC AB 0 CT1 18XCR BRD 8 (SUTURE) ×1 IMPLANT
SUT VIC AB 0 CT1 8-18 (SUTURE) ×2
SUT VIC AB 2-0 CT2 18 VCP726D (SUTURE) ×2 IMPLANT
SYR BULB IRRIGATION 50ML (SYRINGE) ×2 IMPLANT
SYR CONTROL 10ML LL (SYRINGE) ×2 IMPLANT
TOWEL OR 17X24 6PK STRL BLUE (TOWEL DISPOSABLE) ×2 IMPLANT
TOWEL OR 17X26 10 PK STRL BLUE (TOWEL DISPOSABLE) ×2 IMPLANT
TRAY FOLEY CATH 14FRSI W/METER (CATHETERS) ×2 IMPLANT
WATER STERILE IRR 1000ML POUR (IV SOLUTION) ×1 IMPLANT
YANKAUER SUCT BULB TIP NO VENT (SUCTIONS) ×2 IMPLANT

## 2012-07-25 NOTE — H&P (Signed)
PREOPERATIVE H&P  Chief Complaint: left leg pain  HPI: Elijah Marshall is a 62 y.o. male who presents with ongoing left leg pain s/p previous decompression and previous L4/5 fusion. Patient failed multiple forms of conservative care.  Past Medical History  Diagnosis Date  . Allergy   . Ulcer   . COPD (chronic obstructive pulmonary disease)     per patient's health survey - he put a ?  . GERD (gastroesophageal reflux disease)   . Arthritis    Past Surgical History  Procedure Laterality Date  . Spine surgery  2012  . Colonoscopy  14    polyps rem  . Joint replacement Right 2010   History   Social History  . Marital Status: Married    Spouse Name: N/A    Number of Children: N/A  . Years of Education: N/A   Occupational History  . truck driver    Social History Main Topics  . Smoking status: Former Smoker -- 0.50 packs/day for 20 years    Types: Cigarettes    Quit date: 09/24/2011  . Smokeless tobacco: None     Comment: occ beer  . Alcohol Use: Yes     Comment: 2 beers a week  . Drug Use: No  . Sexually Active: Yes    Birth Control/ Protection: None     Comment: number of sex partners in the last 12 months 1   Other Topics Concern  . None   Social History Narrative  . None   Family History  Problem Relation Age of Onset  . Cancer Mother     colon  . Heart disease Father   . Diabetes Sister   . Hypertension Sister   . Hypertension Brother   . Heart disease Maternal Grandmother     heart attack   Allergies  Allergen Reactions  . Aspirin Nausea And Vomiting  . Morphine And Related Itching   Prior to Admission medications   Medication Sig Start Date End Date Taking? Authorizing Provider  albuterol (PROVENTIL HFA;VENTOLIN HFA) 108 (90 BASE) MCG/ACT inhaler Inhale 2 puffs into the lungs every 6 (six) hours as needed for wheezing. 02/16/12 02/15/13 Yes Sherren Mocha, MD  cetirizine (ZYRTEC) 10 MG tablet Take 1 tablet (10 mg total) by mouth daily. 03/01/12  Yes  Sherren Mocha, MD  cyclobenzaprine (FLEXERIL) 10 MG tablet Take 10 mg by mouth 3 (three) times daily as needed for muscle spasms.   Yes Historical Provider, MD  fluticasone (FLONASE) 50 MCG/ACT nasal spray Place 2 sprays into the nose daily. 05/15/12  Yes Sherren Mocha, MD  HYDROcodone-acetaminophen (NORCO/VICODIN) 5-325 MG per tablet Take 1 tablet by mouth every 6 (six) hours as needed for pain.   Yes Historical Provider, MD  omeprazole (PRILOSEC) 40 MG capsule Take 1 capsule (40 mg total) by mouth daily. 05/20/12  Yes Sherren Mocha, MD  tiotropium (SPIRIVA HANDIHALER) 18 MCG inhalation capsule Place 1 capsule (18 mcg total) into inhaler and inhale daily. 03/20/12  Yes Sherren Mocha, MD     All other systems have been reviewed and were otherwise negative with the exception of those mentioned in the HPI and as above.  Physical Exam: Filed Vitals:   07/25/12 0630  BP: 148/94  Pulse: 84  Temp: 98 F (36.7 C)  Resp: 18    General: Alert, no acute distress Cardiovascular: No pedal edema Respiratory: No cyanosis, no use of accessory musculature GI: No organomegaly, abdomen is soft and non-tender Skin:  No lesions in the area of chief complaint Neurologic: Sensation intact distally Psychiatric: Patient is competent for consent with normal mood and affect Lymphatic: No axillary or cervical lymphadenopathy  MUSCULOSKELETAL: + SLR on left  Assessment/Plan: Left leg pain Plan for Procedure(s): ANTERIOR LATERAL LUMBAR FUSION 1 LEVEL, L3/4   Emilee Hero, MD 07/25/2012 7:09 AM

## 2012-07-25 NOTE — Transfer of Care (Signed)
Immediate Anesthesia Transfer of Care Note  Patient: Elijah Marshall  Procedure(s) Performed: Procedure(s) with comments: ANTERIOR LATERAL LUMBAR FUSION 1 LEVEL (Left) - Left sided lumbar 3-4 lateral interbody fusion with allograft and instrumentation  Patient Location: PACU  Anesthesia Type:General  Level of Consciousness: lethargic and responds to stimulation  Airway & Oxygen Therapy: Patient Spontanous Breathing and Patient connected to nasal cannula oxygen  Post-op Assessment: Report given to PACU RN  Post vital signs: Reviewed and stable  Complications: No apparent anesthesia complications

## 2012-07-25 NOTE — Anesthesia Procedure Notes (Signed)
Procedure Name: Intubation Date/Time: 07/25/2012 7:39 AM Performed by: Jefm Miles E Pre-anesthesia Checklist: Patient identified, Timeout performed, Emergency Drugs available, Suction available and Patient being monitored Patient Re-evaluated:Patient Re-evaluated prior to inductionOxygen Delivery Method: Circle system utilized Preoxygenation: Pre-oxygenation with 100% oxygen Intubation Type: IV induction Ventilation: Mask ventilation without difficulty Laryngoscope Size: Mac and 3 Grade View: Grade I Tube type: Oral Tube size: 7.5 mm Number of attempts: 1 Airway Equipment and Method: Stylet Placement Confirmation: positive ETCO2,  ETT inserted through vocal cords under direct vision and breath sounds checked- equal and bilateral Secured at: 23 cm Tube secured with: Tape Dental Injury: Teeth and Oropharynx as per pre-operative assessment

## 2012-07-25 NOTE — Op Note (Signed)
NAMEDARLY, FAILS              ACCOUNT NO.:  [id number]  MEDICAL RECORD NO.:  [id number]  LOCATION:  [facility]                         FACILITY:  [facility]  PHYSICIAN:  Elijah Bamberg, MD      DATE OF BIRTH:  09/17/1950  DATE OF PROCEDURE:  07/25/2012 DATE OF DISCHARGE:                              OPERATIVE REPORT   PREOPERATIVE DIAGNOSES: 1. Adjacent segment disease involving the L3-4 level, the level     adjacent to the previous L4-5 fusion. 2. Severe left-sided L3-4 foraminal stenosis. 3. Severe left-sided L3 radiculopathy secondary to severe spinal     stenosis. 4. Status post previous L3 for decompression.  POSTOPERATIVE DIAGNOSES: 1. Adjacent segment disease involving the L3-4 level, the level     adjacent to the previous L4-5 fusion. 2. Severe left-sided L3-4 foraminal stenosis. 3. Severe left-sided L3 radiculopathy secondary to severe spinal     stenosis. 4. Status post previous L3 for decompression.  PROCEDURES: 1. Lateral lumbar interbody fusion, L3-4. 2. Placement of interbody device x1 (10 x 22 x 55 mm Nuvasive     interbody device). 3. Placement of anterior instrumentation, L3-4 (50 mm length screws     x2). 4. Use of morselized allograft (DBX mix). 5. Intraoperative use of fluoroscopy.  SURGEON:  Elijah Bamberg, MD  ASSISTANT:  Jason Coop, [facility]  ANESTHESIA:  General endotracheal anesthesia.  COMPLICATIONS:  None.  DISPOSITION:  Stable.  ESTIMATED BLOOD LOSS:  Minimal.  INDICATIONS FOR PROCEDURE:  Briefly, Elijah Marshall is a very pleasant 62- year-old male who is 2 years status post a lumbar decompressive procedure by me.  In addition, the patient did previously have an instrumented posterolateral L4-5 fusion.  The patient did very well after his surgery, however, he went on to have increasing pain in his left anterior thigh, consistent with left-sided L3 radiculopathy.  MRI did reveal severe and significant neural foraminal stenosis on the left- sided  L3-4.  The patient did fail multiple forms of conservative care including physical therapy, various medications, lifestyle modification in addition to epidural injections.  However, the patient did continue to have limitations.  We therefore did have a discussion regarding going forward with a L3-4 interbody fusion via left-sided approach.  The patient did understand the unique risks associated with this particular procedure.  Specifically, he did understand the risk of injury to the lumbar plexus, injury to the retroperitoneal and intraperitoneal structures, as well as vascular injury, infection, nonunion, adjacent segment disease, failure of the hardware, etc.  After full understanding of the risks and limitations of the procedure, the patient did wish to proceed.  OPERATIVE DETAILS:  On July 25, 2012, patient was brought to surgery and general endotracheal anesthesia was administered.  The patient was placed in the lateral decubitus position with the left side up.  All bony prominences were meticulously padded.  Axillary roll was placed on the patient's right axilla.  The knees and hips were flexed.  The right and left arm were secured with arm boards.  Peroneal nerves were protected.  The region of the left flank was prepped and draped in usual sterile fashion.  I then marked out the L3-4 interspace.  Of note, there  was significant rotation identified and a significant amount of rotation of the bed was required, in order to ensure appropriate and perpendicular positioning of the L3-4 intervertebral space.  I then made a 2-inch incision overlying the L3-4 interspace.  The external oblique musculature was bluntly dissected, as was the internal oblique musculature.  The transversalis fascia was identified and entered using a Metz.  The perineum was identified and bluntly swept anteriorly.  The psoas was readily noted.  I did place the 1st dilator directly through in line at the L3-4  interspace.  I did obtain a lateral intraoperative radiograph to confirm appropriate positioning of the initial dilator.  I did use triggered EMG using while advancing the guidewire.  There is no EMG activity at less than 15 milliamps circumferentially.  I then placed a 2nd and 3rd dilator over the 1st.  I did use triggered EMG to test each of the dilators and the triggered EMG did confirm that the dilators were not and immediate proximity of any of the nerves involving the lumbar plexus.  The guidewire was advanced through the initial dilator. I then secured a self-retaining retractor over the widest dilator.  I then gently open the retractor in a cephalad caudad manner.  I then used monopolar electrocautery to explore the lateral aspect of the intervertebral space and there were no neurologic structures 1 on the field, including the vicinity of the posterior blade.  Ischium was placed at the posterior blade, secured itself to the posterior aspect of the intervertebral disk.  I then gently retracted the dilator to optimize my anterior exposure.  I then used monopolar EMG to confirm that there were no neurologic structures in the field.  There was not. I then used a 15-blade knife to perform an annulotomy.  A thorough diskectomy was performed in the usual fashion using a series of curettes and pituitary rongeurs.  I did use a Cobb to release the contralateral anulus.  A thorough diskectomy was performed, and I was very pleased with the preparation of the endplates.  I then placed a series of trials and I did feel that a 10 x 22 mm trial will be the most appropriate fit. I did feel that an implant 55 mm in length would be the most appropriate fit.  Therefore, a 10 x 22 x 55 mm implant was packed with DBX mix.  I then placed blades overlying the disk, overlying the endplates, so as to keep the intervertebral process to keep the allograft within the intervertebral spacer.  I then tamped the  intervertebral spacer across the disk space.  I did note excellent restoration of the intervertebral height.  I did note an excellent press fit of the implant.  I then removed the implant.  Did liberally use AP and lateral fluoroscopy to confirm appropriate positioning of the implant.  I then removed the handle.  I then placed a lateral anterior interbody plate overlying the disk space.  I then used an awl to prepare the trajectory for the vertebral body screws.  A 50 mm screws were advanced into the L3 and L4 vertebral bodies uneventfully.  I then locked the screws to the plate. I also did open the plate slightly and I did collect the plate in place. The wound was then copiously irrigated.  I then explored the retroperitoneal space and there was no obvious injury to any of the retroperitoneal structures.  Exploring the peritoneum as it fell back into place, it was noted there was  a small defect in the peritoneum. There was no herniation or injury of any intra-abdominal contents.  There was no undue bleeding.  The wound was then copiously irrigated.  The fascia was then closed using 0 Vicryl.  The subcutaneous layer was closed using 2-0 Vicryl.  The skin was closed using 3-0 Monocryl.  Benzoin and Steri- Strips were applied followed by sterile dressing.  All instrument counts were correct at the termination of the procedure.  Of note, there was no abnormal EMG activity noted throughout the entirety of the procedure.  Of note, Jason Coop was my assistant throughout the entirety of the procedure and aided in essential retraction and suctioning needed throughout the surgery.     Elijah Bamberg, MD     MD/MEDQ  D:  07/25/2012  T:  07/25/2012  Job:  [id number]  cc:   Dr. Sherryll Burger

## 2012-07-25 NOTE — Preoperative (Signed)
Beta Blockers   Reason not to administer Beta Blockers:Not Applicable 

## 2012-07-25 NOTE — Anesthesia Preprocedure Evaluation (Addendum)
Anesthesia Evaluation  Patient identified by MRN, date of birth, ID band Patient awake    Reviewed: Allergy & Precautions, H&P , NPO status , Patient's Chart, lab work & pertinent test results  History of Anesthesia Complications Negative for: history of anesthetic complications  Airway Mallampati: I TM Distance: >3 FB Neck ROM: Full    Dental  (+) Poor Dentition, Missing and Dental Advisory Given   Pulmonary COPD COPD inhaler,  breath sounds clear to auscultation  Pulmonary exam normal       Cardiovascular negative cardio ROS  Rhythm:Regular Rate:Normal     Neuro/Psych Chronic back pain: narcotics daily    GI/Hepatic Neg liver ROS, GERD-  Medicated and Controlled,  Endo/Other  negative endocrine ROS  Renal/GU negative Renal ROS     Musculoskeletal   Abdominal   Peds  Hematology   Anesthesia Other Findings   Reproductive/Obstetrics                          Anesthesia Physical Anesthesia Plan  ASA: III  Anesthesia Plan: General   Post-op Pain Management:    Induction: Intravenous  Airway Management Planned: Oral ETT  Additional Equipment:   Intra-op Plan:   Post-operative Plan: Extubation in OR  Informed Consent: I have reviewed the patients History and Physical, chart, labs and discussed the procedure including the risks, benefits and alternatives for the proposed anesthesia with the patient or authorized representative who has indicated his/her understanding and acceptance.   Dental advisory given  Plan Discussed with: CRNA and Surgeon  Anesthesia Plan Comments: (Plan routine monitors, GETA)        Anesthesia Quick Evaluation

## 2012-07-25 NOTE — Anesthesia Postprocedure Evaluation (Signed)
  Anesthesia Post-op Note  Patient: Elijah Marshall  Procedure(s) Performed: Procedure(s) with comments: ANTERIOR LATERAL LUMBAR FUSION 1 LEVEL (Left) - Left sided lumbar 3-4 lateral interbody fusion with allograft and instrumentation  Patient Location: PACU  Anesthesia Type:General  Level of Consciousness: awake, alert , oriented and patient cooperative  Airway and Oxygen Therapy: Patient Spontanous Breathing and Patient connected to nasal cannula oxygen  Post-op Pain: mild  Post-op Assessment: Post-op Vital signs reviewed, Patient's Cardiovascular Status Stable, Respiratory Function Stable, Patent Airway, No signs of Nausea or vomiting and Pain level controlled  Post-op Vital Signs: Reviewed and stable  Complications: No apparent anesthesia complications

## 2012-07-26 ENCOUNTER — Telehealth: Payer: Self-pay

## 2012-07-26 ENCOUNTER — Inpatient Hospital Stay (HOSPITAL_COMMUNITY): Payer: Managed Care, Other (non HMO)

## 2012-07-26 DIAGNOSIS — J449 Chronic obstructive pulmonary disease, unspecified: Secondary | ICD-10-CM

## 2012-07-26 DIAGNOSIS — R0602 Shortness of breath: Secondary | ICD-10-CM

## 2012-07-26 MED ORDER — PREDNISONE 20 MG PO TABS
40.0000 mg | ORAL_TABLET | Freq: Two times a day (BID) | ORAL | Status: DC
  Administered 2012-07-26 – 2012-07-28 (×3): 40 mg via ORAL
  Filled 2012-07-26 (×5): qty 2

## 2012-07-26 MED ORDER — ALBUTEROL SULFATE (5 MG/ML) 0.5% IN NEBU: 2.5000 mg | INHALATION_SOLUTION | RESPIRATORY_TRACT | Status: DC | PRN

## 2012-07-26 MED ORDER — ALBUTEROL SULFATE HFA 108 (90 BASE) MCG/ACT IN AERS: 2.0000 | INHALATION_SPRAY | RESPIRATORY_TRACT | Status: DC | PRN

## 2012-07-26 MED ORDER — LEVOFLOXACIN 500 MG PO TABS
500.0000 mg | ORAL_TABLET | Freq: Every day | ORAL | Status: DC
  Administered 2012-07-26: 500 mg via ORAL
  Filled 2012-07-26 (×2): qty 1

## 2012-07-26 MED ORDER — BUDESONIDE 0.25 MG/2ML IN SUSP
0.2500 mg | Freq: Two times a day (BID) | RESPIRATORY_TRACT | Status: DC
  Administered 2012-07-27 – 2012-07-28 (×2): 0.25 mg via RESPIRATORY_TRACT
  Filled 2012-07-26 (×7): qty 2

## 2012-07-26 MED ORDER — LORAZEPAM 0.5 MG PO TABS
0.5000 mg | ORAL_TABLET | Freq: Four times a day (QID) | ORAL | Status: DC | PRN
  Administered 2012-07-26: 0.5 mg via ORAL
  Filled 2012-07-26: qty 1

## 2012-07-26 NOTE — Progress Notes (Signed)
PO Day #1 S/P XLIF (Lateral Interbody Fusion) L3-4 for L L3 Radiculopathy. He is doing well, pain well controlled on PCA but having some itching with the dilaudid. L ant thigh pain, worse with hip flexion consistent with psoas discomfort post op. Pt has not been up with PT yet but eager to be OOB. Moderate PO back pain. Unsure if pre-op L leg pain is entirely gone as he has not been OOB yet.   BP 139/81  Pulse 103  Temp(Src) 99.1 F (37.3 C) (Oral)  Resp 20  Ht 5\' 8"  (1.727 m)  Wt 87.3 kg (192 lb 7.4 oz)  BMI 29.27 kg/m2  SpO2 92% Pt laying in hospital A&O x3, appears somewhat restless. Bandage CDI. SCD's in place, neg homans BIL. Able to FLX/EXT ankle BIL. Some discomfort with resisted L hip FLX, reproduces L thigh pain. Neurovascularly intact.    PO Day #1 S/P XLIF (Lateral Interbody Fusion) L3-4 for L L3 Radiculopathy  -Expected PO LBP, well controlled  -Psoas discomfort PO, not unexpected post XLIF, slowly improved from yesterday   -Currently resolved pre-op L leg pain but pt has not been OOB  -PT/OT today   -TLSO brace at all times when OOB  -D/C PCA, transition to oral percocet and valium   -Written Rx's in chart for D/C  -Home today vs tomorrow pending PT and pain control  -Cont SCD's  -F/U in office 2 weeks for PO

## 2012-07-26 NOTE — Progress Notes (Signed)
Occupational Therapy Evaluation Patient Details Name: Elijah Marshall MRN: [id number] DOB: [birth year] Today's Date: 07/26/2012 Time: 1478-2956 OT Time Calculation (min): 46 min  OT Assessment / Plan / Recommendation Clinical Impression  Pt is a 62 yo M with a lumbar fusion at L3-4. PTA Pt was independent with straight cane. Pt and daughter (who is SLP) practiced donning brace with OT. Pt able to transfer and ambulate mostly at min guard with min A for sit <>stand. Next session should focus on LB dressing with AE, tub transfer (Pt has shower seat) and donning/doff of brace.    OT Assessment  Patient needs continued OT Services    Follow Up Recommendations  HH OT      Equipment Recommendations  Other (comment) (Pt has adaptive equipment from knee replacement - see notes)       Frequency  Min 2X/week    Precautions / Restrictions Precautions Precautions: Back;Fall Precaution Booklet Issued: Yes (comment) Required Braces or Orthoses: Spinal Brace Spinal Brace: Thoracolumbosacral orthotic;Applied in sitting position Restrictions Weight Bearing Restrictions: No   Pertinent Vitals/Pain Pt reported 5/10 pain.    ADL  Grooming: Wash/dry hands Where Assessed - Grooming: Supported standing (UE support on sink surface) LB bathing:  Mod A sit to stand LB dressing:  Total A sit to stand.  Pt unable to access feet by crossing ankles over knees.  Will need instruction in use of AE Toilet Transfer:  Min A ambulating to 3in1 over commode Toileting - Clothing Manipulation and Hygiene: Min guard assist in standing Where Assessed - Toileting Clothing Manipulation and Hygiene: Standing Equipment Used: Back brace;Gait belt;Rolling walker Transfers/Ambulation Related to ADLs: Pt min a for sit <>stand with mod vc's for safe hand placement and precautions. Pt min guard for ambulation. ADL Comments: Pt min guard for ADL supported standing at sink level    OT Diagnosis: Generalized weakness;Acute  pain  OT Problem List: Decreased activity tolerance;Decreased safety awareness;Decreased knowledge of use of DME or AE;Decreased knowledge of precautions;Pain OT Treatment Interventions: Self-care/ADL training;DME and/or AE instruction;Therapeutic activities;Patient/family education   OT Goals Acute Rehab OT Goals OT Goal Formulation: With patient Time For Goal Achievement: 08/09/12 Potential to Achieve Goals: Good ADL Goals Pt Will Perform Lower Body Dressing: with caregiver independent in assisting;with min assist;Sitting, chair;with adaptive equipment ADL Goal: Lower Body Dressing - Progress: Goal set today Pt Will Transfer to Toilet: with modified independence;Stand pivot transfer;Regular height toilet ADL Goal: Toilet Transfer - Progress: Goal set today Pt Will Perform Toileting - Clothing Manipulation: with modified independence;Standing ADL Goal: Toileting - Clothing Manipulation - Progress: Goal set today Pt Will Perform Toileting - Hygiene: with modified independence;Sit to stand from 3-in-1/toilet ADL Goal: Toileting - Hygiene - Progress: Goal set today Pt Will Perform Tub/Shower Transfer: Tub transfer;with supervision;with caregiver independent in assisting;Shower seat with back;Maintaining back safety precautions ADL Goal: Tub/Shower Transfer - Progress: Goal set today Additional ADL Goal #1: Pt will don/doff brace with min A with family assisting. ADL Goal: Additional Goal #1 - Progress: Goal set today  Visit Information  Last OT Received On: 07/26/12 Assistance Needed: +1    Subjective Data  Subjective: Pt was in the room with daughter (who is an SLP) granddaughter, sister-in-law.   Prior Functioning     Home Living Lives With: Spouse Available Help at Discharge: Family Type of Home: Apartment Home Access: Stairs to enter Entrance Stairs-Rails: Left Home Layout: One level Bathroom Shower/Tub: Forensic scientist: Standard Bathroom  Accessibility: Yes How Accessible: Accessible  via walker Home Adaptive Equipment: Shower chair with back;Walker - rolling;Grab bars in shower;Bedside commode/3-in-1 Prior Function Level of Independence: Independent with assistive device(s) Able to Take Stairs?: Yes Driving: Yes Vocation: On disability Communication Communication: No difficulties Dominant Hand: Right         Vision/Perception Vision - History Baseline Vision: Wears glasses all the time Visual History: Other (comment) (lazy eye)   Cognition  Cognition Arousal/Alertness: Awake/alert Behavior During Therapy: WFL for tasks assessed/performed Overall Cognitive Status: Within Functional Limits for tasks assessed       Mobility Bed Mobility Bed Mobility: Rolling Right;Right Sidelying to Sit;Sitting - Scoot to [place] of Bed Rolling Right: 4: Min guard Right Sidelying to Sit: 4: Min guard Sitting - Scoot to [place] of Bed: 4: Min guard Details for Bed Mobility Assistance: Pt with mod vc's for maintaining precautions, and min guard for movements Transfers Transfers: Stand to Sit;Sit to Stand Sit to Stand: 4: Min assist;With upper extremity assist;From bed Stand to Sit: 4: Min guard;With upper extremity assist;To chair/3-in-1 Details for Transfer Assistance: mod vc's for safe hand placement and maintaining precautions           End of Session OT - End of Session Equipment Utilized During Treatment: Gait belt;Back brace;Other (comment) (RW) Activity Tolerance: Patient tolerated treatment well Patient left: in chair;with call bell/phone within reach;with family/visitor present Nurse Communication: Mobility status  GO     Sherryl Manges 07/26/2012, 4:56 PM Jeani Hawking, OTR/L [phone]

## 2012-07-26 NOTE — Progress Notes (Signed)
Patient complaining of SOB, O2 sat  88-89% on room air. Vital signs BP 145/82,HR97. O2 started at 3LPM 02 sat at 93%. Will continue to monitor. Md paged. Awaiting call at this time.

## 2012-07-26 NOTE — Progress Notes (Signed)
PA made aware of another episode of SOB as well as the patient had a pneumonia vaccine prior to this episode. New  Order was given and will continue to monitor patient.

## 2012-07-26 NOTE — Consult Note (Signed)
[facility] Medical Consultation  Elijah REPKA Marshall:[id number] DOB: 1950/07/12 DOA: 07/25/2012 PCP: Norberto Sorenson, MD   Requesting physician: Dr. Yevette Edwards Date of consultation: 07/26/12 Reason for consultation: SOB  Impression/Recommendations 1. SOB: with COPD, decrease air movement and positive wheezing on exam. Most likely exacerbation of his COPD is the cause for SOB and desaturation. -will start albuterol nebulizer treatment and pulmicort -start steroids -coadjuvant therapy with levaquin -will check CXR to r/o any new infiltrates as etiology for exacerbation -start IS -will titrate O2 down to room air as tolerated -advise to quit smoking -will check CBC and BMET  -started on IS -continue spiriva  2. Severe vertebral stenosis with radiculopathy affecting L3-L4: per primary team. Status post decompression and lumbar interbody fusion.  3-GERD: continue PPI  4-allergic rhinitis: continue flonase and zyrtec.  Thanks for consultation will follow patient along with you for his resp issues.  Chief Complaint: SOB  HPI:  62 y.o. male who presents with ongoing left leg pain s/p previous decompression and previous L4/5 fusion. Patient failed multiple forms of conservative care. Admitted for laminectomy and fusion. Patient on his 2nd day of admission after procedure has developed SOB; according to patient and records had hx of COPD. [facility] called to assist with management.   Review of Systems:  Negative except as describe don HPI.  Past Medical History  Diagnosis Date  . Allergy   . Ulcer   . COPD (chronic obstructive pulmonary disease)     per patient's health survey - he put a ?  . GERD (gastroesophageal reflux disease)   . Arthritis   . Shortness of breath    Past Surgical History  Procedure Laterality Date  . Spine surgery  2012  . Colonoscopy  14    polyps rem  . Joint replacement Right 2010  . Anterior fusion lumbar spine  07/25/2012   Social History:  reports that  he quit smoking about 10 months ago. His smoking use included Cigarettes. He has a 10 pack-year smoking history. He has never used smokeless tobacco. He reports that  drinks alcohol. He reports that he does not use illicit drugs.  Allergies  Allergen Reactions  . Aspirin Nausea And Vomiting  . Morphine And Related Itching   Family History  Problem Relation Age of Onset  . Cancer Mother     colon  . Heart disease Father   . Diabetes Sister   . Hypertension Sister   . Hypertension Brother   . Heart disease Maternal Grandmother     heart attack    Prior to Admission medications   Medication Sig Start Date End Date Taking? Authorizing Provider  albuterol (PROVENTIL HFA;VENTOLIN HFA) 108 (90 BASE) MCG/ACT inhaler Inhale 2 puffs into the lungs every 6 (six) hours as needed for wheezing. 02/16/12 02/15/13 Yes Sherren Mocha, MD  cetirizine (ZYRTEC) 10 MG tablet Take 1 tablet (10 mg total) by mouth daily. 03/01/12  Yes Sherren Mocha, MD  fluticasone (FLONASE) 50 MCG/ACT nasal spray Place 2 sprays into the nose daily. 05/15/12  Yes Sherren Mocha, MD  omeprazole (PRILOSEC) 40 MG capsule Take 1 capsule (40 mg total) by mouth daily. 05/20/12  Yes Sherren Mocha, MD  tiotropium (SPIRIVA HANDIHALER) 18 MCG inhalation capsule Place 1 capsule (18 mcg total) into inhaler and inhale daily. 03/20/12  Yes Sherren Mocha, MD   Physical Exam: Blood pressure 164/88, pulse 104, temperature 98.7 F (37.1 C), temperature source Oral, resp. rate 18, height 5\' 8"  (1.727 m),  weight 87.3 kg (192 lb 7.4 oz), SpO2 94.00%. Filed Vitals:   07/26/12 0212 07/26/12 0400 07/26/12 0552 07/26/12 1345  BP:   139/81 164/88  Pulse:   103 104  Temp:   99.1 F (37.3 C) 98.7 F (37.1 C)  TempSrc:   Oral Oral  Resp: 20 18 20 18   Height:      Weight:      SpO2: 91% 91% 92% 94%     General:  AAOX3, no CP, breathing a little better after [place] 2-3L applied.  Eyes: PERRL, EOMI, no icterus, no nystagmus  ENT: no erythema, no exudates, no thrush     Neck: supple, no thryomegaly, no bruits  Cardiovascular: s1 and s2, no rubs, no gallops  Respiratory: slight wheezing upper lung fields and decrease air movement.  Abdomen: soft, NT, ND, positive BS  Skin: no rash or petechiae  Musculoskeletal: no LE swelling, cyanosis or clubbing  Psychiatric: appropriate mood and affect  Neurologic: no focal deficit appreciated on exam   Radiological Exams on Admission: Dg Lumbar Spine 2-3 Views  07/25/2012   *RADIOLOGY REPORT*  Clinical Data: L3 - L4 XLIF with lateral pleating and screw fixation  DG C-ARM 61-120 MIN, LUMBAR SPINE - 2-3 VIEW  Comparison:  Lumbar spine MRI - 08/14/2011.  Fluoroscopy time:  1 minute, 49 seconds  Findings:  An AP and lateral spot intraoperative radiographic images of the lower lumbar spine is provided for review.  Images are slightly degraded secondary to coned field of view.  Lumbar spine labeling is based on preexisting surgical hardware seen on preprocedural lumbar spine MRI performed 08/14/2011.  Previously identified L4 - L5 paraspinal fusion hardware appears grossly unchanged.  Interval left lateral side plate fixation of L3 - L4 with associated intervertebral disc space replacement.  No definite evidence of hardware failure or loosening.  Apparent restoration of the L3 - L4 intervertebral disc space height.  IMPRESSION: 1.  Post L3 - L4 left lateral plate fixation and intervertebral disc space replacement. 2.  Grossly stable sequela of L4 and L5 paraspinal fusion.   Original Report Authenticated By: Tacey Ruiz, MD   Dg C-arm [phone] Min  07/25/2012   *RADIOLOGY REPORT*  Clinical Data: L3 - L4 XLIF with lateral pleating and screw fixation  DG C-ARM 61-120 MIN, LUMBAR SPINE - 2-3 VIEW  Comparison:  Lumbar spine MRI - 08/14/2011.  Fluoroscopy time:  1 minute, 49 seconds  Findings:  An AP and lateral spot intraoperative radiographic images of the lower lumbar spine is provided for review.  Images are slightly degraded  secondary to coned field of view.  Lumbar spine labeling is based on preexisting surgical hardware seen on preprocedural lumbar spine MRI performed 08/14/2011.  Previously identified L4 - L5 paraspinal fusion hardware appears grossly unchanged.  Interval left lateral side plate fixation of L3 - L4 with associated intervertebral disc space replacement.  No definite evidence of hardware failure or loosening.  Apparent restoration of the L3 - L4 intervertebral disc space height.  IMPRESSION: 1.  Post L3 - L4 left lateral plate fixation and intervertebral disc space replacement. 2.  Grossly stable sequela of L4 and L5 paraspinal fusion.   Original Report Authenticated By: Tacey Ruiz, MD    Time spent: 45 minutes  Abdulhadi Stopa [facility] Pager [phone]  If 7PM-7AM, please contact night-coverage [email or web address] Password [facility] 07/26/2012, 4:37 PM

## 2012-07-26 NOTE — Clinical Social Work Note (Signed)
CSW received consult for potential SNF placement. CSW will await PT/OT evaluations for recommendations for disposition.   Rozetta Nunnery MSW, [employer] [phone]

## 2012-07-26 NOTE — Telephone Encounter (Signed)
PT STATES HE IS IN THE HOSPITAL NOW, BUT WANTS TO TALK WITH DR HERE ABOUT THE PNEUMONIA VACCINE. PT WAS ADMINISTERED THE VACCINE IN THE HOSPITAL AND WANTS TO TALK ABOUT THE SIDE EFFECTS.PT CAN BE REACHED ON HIS CELL AT [phone]

## 2012-07-26 NOTE — Evaluation (Signed)
Physical Therapy Evaluation Patient Details Name: Elijah Marshall MRN: [id number] DOB: June 02, 1950 Today's Date: 07/26/2012 Time: 1191-4782 PT Time Calculation (min): 27 min  PT Assessment / Plan / Recommendation Clinical Impression  Patient is a 62 yo male s/p XLIF L3-4.  Patient also with SOB today.  Will benefit from acute PT to maximize independence prior to return home with family - will need to be a mod I level at discharge because will be alone for part of the day.    PT Assessment  Patient needs continued PT services    Follow Up Recommendations  No PT follow up;Supervision - Intermittent    Does the patient have the potential to tolerate intense rehabilitation      Barriers to Discharge Decreased caregiver support Patient will be home alone for part of the day.    Equipment Recommendations  None recommended by PT    Recommendations for Other Services     Frequency Min 5X/week    Precautions / Restrictions Precautions Precautions: Back Precaution Booklet Issued: Yes (comment) Precaution Comments: Patient able to recall 3/3 back precautions Required Braces or Orthoses: Spinal Brace Spinal Brace: Thoracolumbosacral orthotic;Applied in sitting position Restrictions Weight Bearing Restrictions: No   Pertinent Vitals/Pain       Mobility  Bed Mobility Bed Mobility: Not assessed Rolling Right: 4: Min guard Right Sidelying to Sit: 4: Min guard Sitting - Scoot to [place] of Bed: 4: Min guard Details for Bed Mobility Assistance: Pt with mod vc's for maintaining precautions, and min guard for movements Transfers Transfers: Sit to Stand;Stand to Sit Sit to Stand: 4: Min assist;With upper extremity assist;With armrests;From chair/3-in-1 Stand to Sit: 4: Min guard;With upper extremity assist;With armrests;To chair/3-in-1 Details for Transfer Assistance: Verbal cues for hand placement and technique Ambulation/Gait Ambulation/Gait Assistance: 4: Min assist Ambulation  Distance (Feet): 96 Feet Assistive device: Rolling walker Ambulation/Gait Assistance Details: Verbal cues to remain upright during gait.  Verbal cues for safe use of RW. Gait Pattern: Step-through pattern;Decreased stride length Gait velocity: Slow gait speed    Exercises     PT Diagnosis: Difficulty walking;Abnormality of gait;Generalized weakness;Acute pain  PT Problem List: Decreased strength;Decreased activity tolerance;Decreased balance;Decreased mobility;Decreased knowledge of use of DME;Decreased knowledge of precautions;Cardiopulmonary status limiting activity;Pain PT Treatment Interventions: DME instruction;Gait training;Stair training;Functional mobility training;Patient/family education   PT Goals Acute Rehab PT Goals PT Goal Formulation: With patient Time For Goal Achievement: 08/02/12 Potential to Achieve Goals: Good Pt will Roll Supine to Right Side: Independently PT Goal: Rolling Supine to Right Side - Progress: Goal set today Pt will go Supine/Side to Sit: Independently;with HOB 0 degrees PT Goal: Supine/Side to Sit - Progress: Goal set today Pt will go Sit to Supine/Side: Independently;with HOB 0 degrees PT Goal: Sit to Supine/Side - Progress: Goal set today Pt will go Sit to Stand: with modified independence;with upper extremity assist PT Goal: Sit to Stand - Progress: Goal set today Pt will Ambulate: 51 - 150 feet;with modified independence;with rolling walker PT Goal: Ambulate - Progress: Goal set today Pt will Go Up / Down Stairs: 3-5 stairs;with min assist;with rail(s);with least restrictive assistive device PT Goal: Up/Down Stairs - Progress: Goal set today  Visit Information  Last PT Received On: 07/26/12 Assistance Needed: +1    Subjective Data  Subjective: "I've had some trouble today"  (Dyspnea) Patient Stated Goal: To go home   Prior Functioning  Home Living Lives With: Spouse Available Help at Discharge: Family;Available PRN/intermittently (Wife  works.  Patient will be  alone part of the day) Type of Home: Apartment Home Access: Stairs to enter Entrance Stairs-Number of Steps: 6 Entrance Stairs-Rails: Left Home Layout: One level Bathroom Shower/Tub: Forensic scientist: Standard Bathroom Accessibility: Yes How Accessible: Accessible via walker Home Adaptive Equipment: Shower chair with back;Walker - rolling;Grab bars in shower;Bedside commode/3-in-1 Prior Function Level of Independence: Independent;Needs assistance Needs Assistance: Bathing;Dressing;Light Housekeeping Bath: Minimal (Lower body) Dressing: Minimal (Lower body) Light Housekeeping: Moderate Able to Take Stairs?: Yes Driving: Yes (short distances) Vocation: On disability Communication Communication: No difficulties Dominant Hand: Right    Cognition  Cognition Arousal/Alertness: Awake/alert Behavior During Therapy: WFL for tasks assessed/performed Overall Cognitive Status: Within Functional Limits for tasks assessed    Extremity/Trunk Assessment Right Upper Extremity Assessment RUE ROM/Strength/Tone: WFL for tasks assessed RUE Sensation: WFL - Light Touch Left Upper Extremity Assessment LUE ROM/Strength/Tone: WFL for tasks assessed LUE Sensation: WFL - Light Touch Right Lower Extremity Assessment RLE ROM/Strength/Tone: WFL for tasks assessed RLE Sensation: WFL - Light Touch Left Lower Extremity Assessment LLE ROM/Strength/Tone: WFL for tasks assessed LLE Sensation: WFL - Light Touch   Balance    End of Session PT - End of Session Equipment Utilized During Treatment: Gait belt;Back brace Activity Tolerance: Patient limited by fatigue;Patient limited by pain Patient left: in chair;with call bell/phone within reach;with family/visitor present Nurse Communication: Mobility status  GP     Vena Austria 07/26/2012, 5:05 PM Durenda Hurt. Renaldo Fiddler, [facility] Acute Rehab Services Pager [phone]

## 2012-07-27 DIAGNOSIS — J189 Pneumonia, unspecified organism: Secondary | ICD-10-CM

## 2012-07-27 LAB — BASIC METABOLIC PANEL
BUN: 6 mg/dL (ref 6–23)
CO2: 24 mEq/L (ref 19–32)
Calcium: 9.4 mg/dL (ref 8.4–10.5)
Chloride: 100 mEq/L (ref 96–112)
Creatinine, Ser: 0.91 mg/dL (ref 0.50–1.35)
GFR calc Af Amer: >90 mL/min (ref >90)
GFR calc non Af Amer: 90 mL/min — ABNORMAL LOW (ref >90)
Glucose, Bld: 125 mg/dL — ABNORMAL HIGH (ref 70–99)
Potassium: 3.8 mEq/L (ref 3.5–5.1)
Sodium: 135 mEq/L (ref 135–145)

## 2012-07-27 LAB — CBC
HCT: 44.8 % (ref 39.0–52.0)
Hemoglobin: 15.9 g/dL (ref 13.0–17.0)
MCH: 30.6 pg (ref 26.0–34.0)
MCHC: 35.5 g/dL (ref 30.0–36.0)
MCV: 86.2 fL (ref 78.0–100.0)
Platelets: 171 10*3/uL (ref 150–400)
RBC: 5.2 MIL/uL (ref 4.22–5.81)
RDW: 12.9 % (ref 11.5–15.5)
WBC: 11.8 10*3/uL — ABNORMAL HIGH (ref 4.0–10.5)

## 2012-07-27 LAB — EXPECTORATED SPUTUM ASSESSMENT W REFEX TO RESP CULTURE

## 2012-07-27 LAB — EXPECTORATED SPUTUM ASSESSMENT W GRAM STAIN, RFLX TO RESP C

## 2012-07-27 LAB — TSH: TSH: 0.413 u[IU]/mL (ref 0.350–4.500)

## 2012-07-27 MED ORDER — LEVOFLOXACIN IN D5W 750 MG/150ML IV SOLN
750.0000 mg | INTRAVENOUS | Status: DC
  Administered 2012-07-27: 750 mg via INTRAVENOUS
  Filled 2012-07-27 (×3): qty 150

## 2012-07-27 MED ORDER — VANCOMYCIN HCL IN DEXTROSE 1-5 GM/200ML-% IV SOLN
1000.0000 mg | Freq: Three times a day (TID) | INTRAVENOUS | Status: DC
  Administered 2012-07-27 – 2012-07-28 (×4): 1000 mg via INTRAVENOUS
  Filled 2012-07-27 (×6): qty 200

## 2012-07-27 MED ORDER — DEXTROSE 5 % IV SOLN
1.0000 g | Freq: Three times a day (TID) | INTRAVENOUS | Status: DC
  Administered 2012-07-27 – 2012-07-28 (×4): 1 g via INTRAVENOUS
  Filled 2012-07-27 (×7): qty 1

## 2012-07-27 MED ORDER — LEVOFLOXACIN IN D5W 500 MG/100ML IV SOLN
500.0000 mg | INTRAVENOUS | Status: DC
  Filled 2012-07-27: qty 100

## 2012-07-27 MED ORDER — NICOTINE 14 MG/24HR TD PT24
14.0000 mg | MEDICATED_PATCH | Freq: Every day | TRANSDERMAL | Status: DC
  Filled 2012-07-27 (×2): qty 1

## 2012-07-27 NOTE — Clinical Social Work Note (Signed)
Clinical Social Worker reviewed chart and noticed PT/OT recommendations are for home health services. CSW will sign off, as social work intervention is no longer needed.   Rozetta Nunnery MSW, [employer] [phone]

## 2012-07-27 NOTE — Progress Notes (Signed)
PO Day #2 S/P XLIF (Lateral Interbody Fusion) L3-4 for L L3 Radiculopathy.   Pain well-controlled.  Complains of left ant thigh pain, worse with hip flexion consistent with psoas discomfort post op. Denies SOB this morning.  [facility] was consulted yesterday to manage respiratory complaints.  A&O x3. Dressing clean and dry. SCD's in place.  Strength intact with dorsiflexion and plantar flexion.  Discomfort with resisted L hip FLX, reproduces L thigh pain. Neurovascularly intact.   A: 1. PO Day #2 S/P XLIF (Lateral Interbody Fusion) L3-4 for L L3 Radiculopathy  2. SOB.  Chest xray shows possible lower lobe pneumonia - per [facility]  -Expected PO LBP, well controlled  -Psoas discomfort PO, not unexpected post XLIF -PT/OT today  -TLSO brace at all times when OOB  -Written Rx's in chart for D/C  -Home when pain controlled and medically stable -Cont SCD's  -F/U in office 2 weeks for PO

## 2012-07-27 NOTE — Progress Notes (Signed)
ANTIBIOTIC CONSULT NOTE - INITIAL  Pharmacy Consult for vancomycin, renal adjustment of antibiotics Indication: rule out pneumonia  Allergies  Allergen Reactions  . Aspirin Nausea And Vomiting  . Morphine And Related Itching    Patient Measurements: Height: 5\' 8"  (172.7 cm) Weight: 192 lb 7.4 oz (87.3 kg) IBW/kg (Calculated) : 68.4   Vital Signs: Temp: 98 F (36.7 C) (06/21 0534) Temp src: Oral (06/21 0534) BP: 154/95 mmHg (06/21 0534) Pulse Rate: 98 (06/21 0534) Intake/Output from previous day: 06/20 0701 - 06/21 0700 In: 1849 [I.V.:1849] Out: 625 [Urine:625] Intake/Output from this shift:    Labs:  Recent Labs  07/27/12 0530  WBC 11.8*  HGB 15.9  PLT 171  CREATININE 0.91   Estimated Creatinine Clearance: 91.6 ml/min (by C-G formula based on Cr of 0.91). No results found for this basename: VANCOTROUGH, Leodis Binet, VANCORANDOM, GENTTROUGH, GENTPEAK, GENTRANDOM, TOBRATROUGH, TOBRAPEAK, TOBRARND, AMIKACINPEAK, AMIKACINTROU, AMIKACIN,  in the last 72 hours   Microbiology: Recent Results (from the past 720 hour(s))  SURGICAL PCR SCREEN     Status: None   Collection Time    07/18/12  1:30 PM      Result Value Range Status   MRSA, PCR NEGATIVE  NEGATIVE Final   Staphylococcus aureus NEGATIVE  NEGATIVE Final   Comment:            The Xpert SA Assay (FDA     approved for NASAL specimens     in patients over 42 years of age),     is one component of     a comprehensive surveillance     program.  Test performance has     been validated by [employer] for patients greater     than or equal to 72 year old.     It is not intended     to diagnose infection nor to     guide or monitor treatment.    Medical History: Past Medical History  Diagnosis Date  . Allergy   . Ulcer   . COPD (chronic obstructive pulmonary disease)     per patient's health survey - he put a ?  . GERD (gastroesophageal reflux disease)   . Arthritis   . Shortness of breath      Medications:  Prescriptions prior to admission  Medication Sig Dispense Refill  . albuterol (PROVENTIL HFA;VENTOLIN HFA) 108 (90 BASE) MCG/ACT inhaler Inhale 2 puffs into the lungs every 6 (six) hours as needed for wheezing.  1 Inhaler  11  . cetirizine (ZYRTEC) 10 MG tablet Take 1 tablet (10 mg total) by mouth daily.  30 tablet  11  . fluticasone (FLONASE) 50 MCG/ACT nasal spray Place 2 sprays into the nose daily.  16 g  5  . omeprazole (PRILOSEC) 40 MG capsule Take 1 capsule (40 mg total) by mouth daily.  90 capsule  1  . tiotropium (SPIRIVA HANDIHALER) 18 MCG inhalation capsule Place 1 capsule (18 mcg total) into inhaler and inhale daily.  90 capsule  4  . [DISCONTINUED] cyclobenzaprine (FLEXERIL) 10 MG tablet Take 10 mg by mouth 3 (three) times daily as needed for muscle spasms.      . [DISCONTINUED] HYDROcodone-acetaminophen (NORCO/VICODIN) 5-325 MG per tablet Take 1 tablet by mouth every 6 (six) hours as needed for pain.       Assessment: 62 yo lady s/p lateral interbody fusion to start broad spectrum antibiotics for r/ o PNA.  SrCr 0.91, CrCl ~91 ml/min.  WBC 11.8,  Afeb  Also on cefepime 1 gm IV q8 and levaquin 500 mg IV q24.  Goal of Therapy:  Vancomycin trough level 15-20 mcg/ml  Plan:  Will change levaquin to 750 mg IV q24 hours.  Cefepime dose appropriate. Vancomycin 1 gm IV q8 hours. F/u renal function, clinical course and cultures.  Talbert Cage Poteet 07/27/2012,8:46 AM

## 2012-07-27 NOTE — Progress Notes (Addendum)
[facility] PROGRESS NOTE  Elijah Marshall ZOX:[id number] DOB: 13-Jun-1950 DOA: 07/25/2012 PCP: Norberto Sorenson, MD  Assessment/Plan: 1. SOB/hypoxia: 2/2 to COPD exacerbation due to PNA.  -will continue albuterol nebulizer treatment and pulmicort  -continue steroids  -given x-ray suggesting PNA will broaden abx therapy to cover for HCAP -encourage and continue IS  -will titrate O2 down to room air as tolerated  -advise to quit smoking  -continue spiriva   2. HCAP: treatment as mentioned above.  3. Severe vertebral stenosis with radiculopathy affecting L3-L4: per primary team. Status post decompression and lumbar interbody fusion.   4.GERD: continue PPI   5. Allergic rhinitis: continue flonase and zyrtec.   6.Leukocytosis: infection (#1) vs due to steroids. Will continue treatment with abx's and follow CBC.  7. Tobacco abuse: cessation counseling provided. Will recommend nicotine patch as inpatient.  Patient is doing better overall and will anticipate able to narrow abx's and start tapering steroids in 1-2 days.  Thanks for consultation will follow patient along with you for his resp issues.  Code Status: Full Family Communication: wife and daughter at bedside Disposition Plan: home when medically stable (1-2 days)   Procedures:  See x-ray reports below  Antibiotics:  vanc  Cefepime  levaquin  HPI/Subjective: Patient reports is breathing better and less heavier today; no CP. X-ray has demonstrated right lung PNA and that is most likely the reason for his COPD exacerbation and hypoxia.  Objective: Filed Vitals:   07/26/12 0552 07/26/12 1345 07/26/12 2125 07/27/12 0534  BP: 139/81 164/88 141/86 154/95  Pulse: 103 104 97 98  Temp: 99.1 F (37.3 C) 98.7 F (37.1 C) 98.6 F (37 C) 98 F (36.7 C)  TempSrc: Oral Oral Oral Oral  Resp: 20 18 18 18   Height:      Weight:      SpO2: 92% 94% 95% 96%    Intake/Output Summary (Last 24 hours) at 07/27/12 1351 Last  data filed at 07/27/12 0500  Gross per 24 hour  Intake   1849 ml  Output    625 ml  Net   1224 ml   Filed Weights   07/25/12 1447  Weight: 87.3 kg (192 lb 7.4 oz)    Exam:   General:  Feeling better and breathing more comfortable  Cardiovascular: S1 and S2, no rubs or gallops  Respiratory: no wheezing this morning; no crackles  Abdomen: soft, NT, ND, positive BS  Musculoskeletal: no edema on his LE's  Data Reviewed: Basic Metabolic Panel:  Recent Labs Lab 07/27/12 0530  NA 135  K 3.8  CL 100  CO2 24  GLUCOSE 125*  BUN 6  CREATININE 0.91  CALCIUM 9.4   CBC:  Recent Labs Lab 07/27/12 0530  WBC 11.8*  HGB 15.9  HCT 44.8  MCV 86.2  PLT 171    Recent Results (from the past 240 hour(s))  SURGICAL PCR SCREEN     Status: None   Collection Time    07/18/12  1:30 PM      Result Value Range Status   MRSA, PCR NEGATIVE  NEGATIVE Final   Staphylococcus aureus NEGATIVE  NEGATIVE Final   Comment:            The Xpert SA Assay (FDA     approved for NASAL specimens     in patients over 63 years of age),     is one component of     a comprehensive surveillance     program.  Test performance has  been validated by [facility] for patients greater     than or equal to 68 year old.     It is not intended     to diagnose infection nor to     guide or monitor treatment.     Studies: Dg Chest 2 View  07/26/2012   *RADIOLOGY REPORT*  Clinical Data: Short of breath  CHEST - 2 VIEW  Comparison: [id number]  Findings: There is mild air space disease in the right lower lobe which is new from prior.  Mild atelectasis at the left lung base. No pneumothorax.  No pulmonary edema.  IMPRESSION: Concern for right lower lobe pneumonia.  Call report   Original Report Authenticated By: Genevive Bi, M.D.    Scheduled Meds: . budesonide (PULMICORT) nebulizer solution  0.25 mg Nebulization BID  . ceFEPime (MAXIPIME) IV  1 g Intravenous Q8H  . docusate sodium  100 mg  Oral BID  . fluticasone  2 spray Each Nare Daily  . levofloxacin (LEVAQUIN) IV  750 mg Intravenous Q24H  . loratadine  10 mg Oral Daily  . pantoprazole  40 mg Oral Daily  . predniSONE  40 mg Oral BID WC  . sodium chloride  3 mL Intravenous Q12H  . tiotropium  18 mcg Inhalation Daily  . vancomycin  1,000 mg Intravenous Q8H   Continuous Infusions: . sodium chloride    . sodium chloride 20 mL/hr at 07/26/12 1636    Active Problems:   * No active hospital problems. *    Time spent: >30 minutes    Curry Dulski  [facility] Pager [phone]. If 7PM-7AM, please contact night-coverage at [email or web address], password [facility] 07/27/2012, 1:51 PM  LOS: 2 days

## 2012-07-27 NOTE — Progress Notes (Signed)
Physical Therapy Treatment Patient Details Name: Elijah Marshall MRN: [id number] DOB: [birth year] Today's Date: 07/27/2012 Time: 4742-5956 PT Time Calculation (min): 26 min  PT Assessment / Plan / Recommendation Comments on Treatment Session  Session focused on sit<>stand transfers , bed mobility. & pt/family education on donning/doffing back brace.    Pt deferring ambulation at this time due to he has been ambulating around unit multiple times with his wife.  Next session will focus on ambulation & stair training.      Follow Up Recommendations  No PT follow up;Supervision - Intermittent     Does the patient have the potential to tolerate intense rehabilitation     Barriers to Discharge        Equipment Recommendations  None recommended by PT    Recommendations for Other Services    Frequency Min 5X/week   Plan Discharge plan remains appropriate;Frequency remains appropriate    Precautions / Restrictions Precautions Precautions: Back Precaution Comments: Reviewed back precautions.   Required Braces or Orthoses: Spinal Brace Spinal Brace: Thoracolumbosacral orthotic;Applied in sitting position Restrictions Weight Bearing Restrictions: No       Mobility  Bed Mobility Bed Mobility: Rolling Left;Left Sidelying to Sit;Sitting - Scoot to Edge of Bed;Sit to Sidelying Left Rolling Left: With rail;4: Min guard Left Sidelying to Sit: 4: Min assist;HOB flat Sitting - Scoot to Edge of Bed: 4: Min guard Sit to Sidelying Left: 4: Min assist;HOB flat Details for Bed Mobility Assistance: Cues to reinforce logrolling technique & back precautions.  (A) to lift LE's into bed.  Performed 2x's.   Transfers Transfers: Sit to Stand;Stand to Sit Sit to Stand: 5: Supervision;With upper extremity assist;With armrests;From chair/3-in-1;From bed Stand to Sit: 5: Supervision;With upper extremity assist;With armrests;To bed;To chair/3-in-1 Details for Transfer Assistance: cues to scoot hips closer to  seated surface before standing & for safe hand placement.   Ambulation/Gait Ambulation/Gait Assistance: 4: Min guard Ambulation Distance (Feet): 5 Feet Assistive device: Rolling walker Ambulation/Gait Assistance Details: Pt deferring increased distance at this time due to he has been ambulating around unit with his wife prior to PT session.        PT Goals Acute Rehab PT Goals Time For Goal Achievement: 08/02/12 Potential to Achieve Goals: Good Pt will Roll Supine to Right Side: Independently Pt will go Supine/Side to Sit: Independently;with HOB 0 degrees PT Goal: Supine/Side to Sit - Progress: Progressing toward goal Pt will go Sit to Supine/Side: Independently;with HOB 0 degrees PT Goal: Sit to Supine/Side - Progress: Progressing toward goal Pt will go Sit to Stand: with modified independence;with upper extremity assist PT Goal: Sit to Stand - Progress: Progressing toward goal Pt will Ambulate: 51 - 150 feet;with modified independence;with rolling walker Pt will Go Up / Down Stairs: 3-5 stairs;with min assist;with rail(s);with least restrictive assistive device  Visit Information  Last PT Received On: 07/27/12 Assistance Needed: +1    Subjective Data      Cognition  Cognition Arousal/Alertness: Awake/alert Behavior During Therapy: WFL for tasks assessed/performed Overall Cognitive Status: Within Functional Limits for tasks assessed    Balance     End of Session PT - End of Session Equipment Utilized During Treatment: Back brace Activity Tolerance: Patient tolerated treatment well Patient left: in chair;with call bell/phone within reach;with family/visitor present Nurse Communication: Mobility status     Verdell Face, [place] [phone] 07/27/2012

## 2012-07-27 NOTE — Progress Notes (Signed)
Occupational Therapy Treatment Patient Details Name: Elijah Marshall MRN: [id number] DOB: March 25, 1950 Today's Date: 07/27/2012 Time: 6213-0865 OT Time Calculation (min): 39 min  OT Assessment / Plan / Recommendation Comments on Treatment Session      Follow Up Recommendations  No OT follow up    Barriers to Discharge       Equipment Recommendations  None recommended by OT    Recommendations for Other Services    Frequency Min 2X/week   Plan      Precautions / Restrictions Precautions Precautions: Back Precaution Comments: Reviewed back precautions.   Required Braces or Orthoses: Spinal Brace Spinal Brace: Thoracolumbosacral orthotic;Applied in sitting position Restrictions Weight Bearing Restrictions: No   Pertinent Vitals/Pain No c/o pain    ADL  Lower Body Dressing: Minimal assistance (with AE) Where Assessed - Lower Body Dressing: Supported sit to stand Toilet Transfer: Buyer, retail Method: Sit to Barista:  (ambulated to chair. Was in hall with wife) Equipment Used: Back brace;Gait belt;Rolling walker Transfers/Ambulation Related to ADLs: Cues for bed mobility (practiced twice) and sit to stand.  Pt has had back surgery awhile ago, but forgot sequence.  First practice needed multimodal cues not to twist, but did better second trial. ADL Comments: Wife donned brace with min cues.  Pt practiced with AE which he has.  He needed assistance with sock aid as there was resistance even when powder used.  Educated not to pull if it doesn't go on easily.  Also educated wife that she might leave hospital socks on when she has to return to work so pt doesn't  have to worry about this.  Pt is not ready to step into tub at this time.  He has a seat but may be able to borrow a tub bench.  Reviewed that sequence with him.   Educated and demonstrated toilet aid, if pt needs this.     OT Diagnosis:    OT Problem List:   OT Treatment  Interventions:     OT Goals ADL Goals Pt Will Perform Lower Body Dressing: with caregiver independent in assisting;with min assist;Sitting, chair;with adaptive equipment ADL Goal: Lower Body Dressing - Progress: Met Pt Will Transfer to Toilet: with modified independence;Stand pivot transfer;Regular height toilet ADL Goal: Toilet Transfer - Progress: Progressing toward goals Pt Will Perform Toileting - Clothing Manipulation: with modified independence;Standing Pt Will Perform Toileting - Hygiene: with modified independence;Sit to stand from 3-in-1/toilet Pt Will Perform Tub/Shower Transfer: Tub transfer;with supervision;with caregiver independent in assisting;Maintaining back safety precautions;Transfer tub bench ADL Goal: Tub/Shower Transfer - Progress: Revised due to lack of progress Additional ADL Goal #1: Pt will don/doff brace with min A with family assisting. (wife able to do this) ADL Goal: Additional Goal #1 - Progress: Progressing toward goals  Visit Information  Last OT Received On: 07/27/12 Assistance Needed: +1    Subjective Data      Prior Functioning       Cognition  Cognition Arousal/Alertness: Awake/alert Behavior During Therapy: WFL for tasks assessed/performed Overall Cognitive Status: Within Functional Limits for tasks assessed    Mobility  Bed Mobility Bed Mobility: Rolling Left;Left Sidelying to Sit;Sitting - Scoot to Edge of Bed;Sit to Sidelying Left Rolling Left: With rail;4: Min guard Left Sidelying to Sit: 4: Min assist;HOB flat Sitting - Scoot to Edge of Bed: 4: Min guard Sit to Sidelying Left: 4: Min assist;HOB flat Details for Bed Mobility Assistance: Cues to reinforce logrolling technique & back precautions.  (A) to  lift LE's into bed.  Performed 2x's.   Transfers Sit to Stand: 5: Supervision;With upper extremity assist;With armrests;From chair/3-in-1;From bed Stand to Sit: 5: Supervision;With upper extremity assist;With armrests;To bed;To  chair/3-in-1 Details for Transfer Assistance: cues for hand placement to to scoot towards edge of surface    Exercises      Balance     End of Session OT - End of Session Activity Tolerance: Patient tolerated treatment well Patient left: in chair;with call bell/phone within reach;with family/visitor present  GO     Elijah Marshall 07/27/2012, 1:18 PM Marica Otter, OTR/L [phone] 07/27/2012

## 2012-07-28 DIAGNOSIS — F17200 Nicotine dependence, unspecified, uncomplicated: Secondary | ICD-10-CM

## 2012-07-28 LAB — CBC
HCT: 43.2 % (ref 39.0–52.0)
Hemoglobin: 15 g/dL (ref 13.0–17.0)
MCH: 29.8 pg (ref 26.0–34.0)
MCHC: 34.7 g/dL (ref 30.0–36.0)
MCV: 85.7 fL (ref 78.0–100.0)
Platelets: 186 10*3/uL (ref 150–400)
RBC: 5.04 MIL/uL (ref 4.22–5.81)
RDW: 13.2 % (ref 11.5–15.5)
WBC: 10.3 10*3/uL (ref 4.0–10.5)

## 2012-07-28 LAB — LEGIONELLA ANTIGEN, URINE: Legionella Antigen, Urine: NEGATIVE

## 2012-07-28 LAB — STREP PNEUMONIAE URINARY ANTIGEN: Strep Pneumo Urinary Antigen: NEGATIVE

## 2012-07-28 MED ORDER — LEVOFLOXACIN 750 MG PO TABS: 750.0000 mg | ORAL_TABLET | Freq: Every day | ORAL | Status: DC

## 2012-07-28 MED ORDER — PREDNISONE 20 MG PO TABS: ORAL_TABLET | ORAL | Status: DC

## 2012-07-28 MED ORDER — BUDESONIDE-FORMOTEROL FUMARATE 160-4.5 MCG/ACT IN AERO: 2.0000 | INHALATION_SPRAY | Freq: Two times a day (BID) | RESPIRATORY_TRACT | Status: DC

## 2012-07-28 NOTE — Progress Notes (Signed)
[facility] PROGRESS NOTE  MD SMOLA RUE:[id number] DOB: [birth year] DOA: 07/25/2012 PCP: Norberto Sorenson, MD  Assessment/Plan: 1. SOB/hypoxia: 2/2 to COPD exacerbation due to PNA.  -PRN albuterol as he was using prior to admission. -continue steroids now with tapering dose instructions (Take 2 tabs by mouth daily X 2 days, then start taking 1 tablet by mouth daily X 2 days; then start taking 1/2 tablet by mouth daily X 3 days and stop prednisone). -started on levaquin PO and the plan is to treat for 7 more days to complete antibiotic therapy. -encourage continue use of IS  -good O2 sat on RA at rest and on ambulation. -advise to quit smoking  -continue spiriva and started on symbicort BID.  2. HCAP: treatment now with levaquin to finish antibiotic regimen.  3. Severe vertebral stenosis with radiculopathy affecting L3-L4: per primary team. Status post decompression and lumbar interbody fusion.   4.GERD: continue PPI   5. Allergic rhinitis: continue flonase and zyrtec as needed as he was using prior to admission.  6.Leukocytosis: infection (#1) vs due to steroids.  -WNL at discharge -No fever  7. Tobacco abuse: cessation counseling provided. Patient is planning to quit.  *Thanks for consultation. Patient is now ready to be discharge. Medications has been reconcile reflecting our instructions.  Code Status: Full Family Communication: wife and daughter at bedside Disposition Plan: ok to be discharge per IM standpoint.   Procedures:  See x-ray reports below  Antibiotics:  vanc  Cefepime  levaquin  HPI/Subjective: Patient able to speak in full sentences, breathing a lot better; no wheezing and afebrile.  Objective: Filed Vitals:   07/28/12 0220 07/28/12 0555 07/28/12 1048 07/28/12 1359  BP: 148/90 115/76 145/90 147/81  Pulse: 84 89 89 93  Temp: 98 F (36.7 C) 97.7 F (36.5 C) 97.8 F (36.6 C) 97.8 F (36.6 C)  TempSrc:   Oral Oral  Resp: 18 18 20 18    Height:      Weight:      SpO2: 94% 94% 95% 100%    Intake/Output Summary (Last 24 hours) at 07/28/12 1507 Last data filed at 07/28/12 1478  Gross per 24 hour  Intake      0 ml  Output    825 ml  Net   -825 ml   Filed Weights   07/25/12 1447  Weight: 87.3 kg (192 lb 7.4 oz)    Exam:   General:  Feeling better and breathing more comfortable  Cardiovascular: S1 and S2, no rubs or gallops  Respiratory: no wheezing, good air movement, no crackles.  Abdomen: soft, NT, ND, positive BS  Musculoskeletal: no edema on his LE's  Data Reviewed: Basic Metabolic Panel:  Recent Labs Lab 07/27/12 0530  NA 135  K 3.8  CL 100  CO2 24  GLUCOSE 125*  BUN 6  CREATININE 0.91  CALCIUM 9.4   CBC:  Recent Labs Lab 07/27/12 0530 07/28/12 0445  WBC 11.8* 10.3  HGB 15.9 15.0  HCT 44.8 43.2  MCV 86.2 85.7  PLT 171 186    Recent Results (from the past 240 hour(s))  CULTURE, EXPECTORATED SPUTUM-ASSESSMENT     Status: None   Collection Time    07/27/12  4:06 PM      Result Value Range Status   Specimen Description SPUTUM   Final   Special Requests NONE   Final   Sputum evaluation     Final   Value: THIS SPECIMEN IS ACCEPTABLE. RESPIRATORY CULTURE REPORT TO FOLLOW.  Report Status 07/27/2012 FINAL   Final  CULTURE, RESPIRATORY (NON-EXPECTORATED)     Status: None   Collection Time    07/27/12  4:06 PM      Result Value Range Status   Specimen Description SPUTUM   Final   Special Requests NONE   Final   Gram Stain PENDING   Incomplete   Culture Culture reincubated for better growth   Final   Report Status PENDING   Incomplete     Studies: Dg Chest 2 View  07/26/2012   *RADIOLOGY REPORT*  Clinical Data: Short of breath  CHEST - 2 VIEW  Comparison: 612 [id number]  Findings: There is mild air space disease in the right lower lobe which is new from prior.  Mild atelectasis at the left lung base. No pneumothorax.  No pulmonary edema.  IMPRESSION: Concern for right lower lobe  pneumonia.  Call report   Original Report Authenticated By: Genevive Bi, M.D.    Scheduled Meds: . budesonide (PULMICORT) nebulizer solution  0.25 mg Nebulization BID  . ceFEPime (MAXIPIME) IV  1 g Intravenous Q8H  . docusate sodium  100 mg Oral BID  . fluticasone  2 spray Each Nare Daily  . levofloxacin (LEVAQUIN) IV  750 mg Intravenous Q24H  . loratadine  10 mg Oral Daily  . nicotine  14 mg Transdermal Daily  . pantoprazole  40 mg Oral Daily  . predniSONE  40 mg Oral BID WC  . sodium chloride  3 mL Intravenous Q12H  . tiotropium  18 mcg Inhalation Daily  . vancomycin  1,000 mg Intravenous Q8H   Continuous Infusions: . sodium chloride    . sodium chloride 20 mL/hr at 07/26/12 1636    Time spent: >30 minutes    Jameelah Watts  [facility] Pager [phone]. If 7PM-7AM, please contact night-coverage at [email or web address], password [facility] 07/28/2012, 3:07 PM  LOS: 3 days

## 2012-07-28 NOTE — Progress Notes (Signed)
PO Day #3 S/P XLIF (Lateral Interbody Fusion) L3-4 for L L3 Radiculopathy.   Pain well-controlled. Complains of left ant thigh pain, worse with hip flexion consistent with psoas discomfort post op. The thigh pain is better than yesterday.  He was up walking with PT and had no radicular pain in his left leg.  Denies SOB this morning, but complains of fatigue with PT. [facility] was consulted on Friday to manage respiratory complaints.  A&O x3. TLSO brace in place. Strength intact with dorsiflexion and plantar flexion. Discomfort with resisted L hip FLX, reproduces L thigh pain. Neurovascularly intact.   A: 1. PO Day #3 S/P XLIF (Lateral Interbody Fusion) L3-4 for L L3 Radiculopathy  2. SOB. Chest xray shows possible lower lobe pneumonia - per [facility]  -Expected PO LBP, well controlled.  Leg pain resolved. -Psoas discomfort PO, not unexpected post XLIF  -TLSO brace at all times when OOB  -Written Rx's in chart for D/C  -Home when medically stable - today vs. tomorrow -Cont SCD's  -F/U in office 2 weeks for PO

## 2012-07-28 NOTE — Progress Notes (Signed)
Physical Therapy Treatment Patient Details Name: Elijah Marshall MRN: [id number] DOB: 05-20-1950 Today's Date: 07/28/2012 Time: 5784-6962 PT Time Calculation (min): 24 min  PT Assessment / Plan / Recommendation Comments on Treatment Session  Pt cont's to make steady progress with mobility.  Pt is appropriate to d/c home from PT standpoint when MD feels medically appropriate.     Follow Up Recommendations  No PT follow up;Supervision - Intermittent     Does the patient have the potential to tolerate intense rehabilitation     Barriers to Discharge        Equipment Recommendations  None recommended by PT    Recommendations for Other Services    Frequency Min 5X/week   Plan Discharge plan remains appropriate;Frequency remains appropriate    Precautions / Restrictions Precautions Precautions: Back Precaution Comments: Reviewed back precautions.   Required Braces or Orthoses: Spinal Brace Spinal Brace: Thoracolumbosacral orthotic;Applied in sitting position Restrictions Weight Bearing Restrictions: No       Mobility  Bed Mobility Bed Mobility: Not assessed Transfers Transfers: Sit to Stand;Stand to Sit Sit to Stand: 6: Modified independent (Device/Increase time);With upper extremity assist;From bed Stand to Sit: 6: Modified independent (Device/Increase time);With upper extremity assist;With armrests;To chair/3-in-1 Details for Transfer Assistance: demonstrates safe technique Ambulation/Gait Ambulation/Gait Assistance: 5: Supervision Ambulation Distance (Feet): 200 Feet Assistive device: Rolling walker Ambulation/Gait Assistance Details: Pt ambulates with safe pace & good posture.   Gait Pattern: Step-through pattern;Decreased stride length Stairs: Yes Stairs Assistance: 4: Min guard Stair Management Technique: One rail Left;Step to pattern;Forwards Number of Stairs: 3 (2x's) Wheelchair Mobility Wheelchair Mobility: No      PT Goals Acute Rehab PT Goals Time For  Goal Achievement: 08/02/12 Potential to Achieve Goals: Good Pt will Roll Supine to Right Side: Independently Pt will go Supine/Side to Sit: Independently;with HOB 0 degrees Pt will go Sit to Supine/Side: Independently;with HOB 0 degrees Pt will go Sit to Stand: with modified independence;with upper extremity assist PT Goal: Sit to Stand - Progress: Met Pt will Ambulate: 51 - 150 feet;with modified independence;with rolling walker PT Goal: Ambulate - Progress: Progressing toward goal Pt will Go Up / Down Stairs: 3-5 stairs;with min assist;with rail(s);with least restrictive assistive device PT Goal: Up/Down Stairs - Progress: Met  Visit Information  Last PT Received On: 07/28/12 Assistance Needed: +1    Subjective Data      Cognition  Cognition Arousal/Alertness: Awake/alert Behavior During Therapy: WFL for tasks assessed/performed Overall Cognitive Status: Within Functional Limits for tasks assessed    Balance     End of Session PT - End of Session Equipment Utilized During Treatment: Back brace Activity Tolerance: Patient tolerated treatment well Patient left: in chair;with call bell/phone within reach;with family/visitor present Nurse Communication: Mobility status     Verdell Face, [place] [phone] 07/28/2012

## 2012-07-30 LAB — CULTURE, RESPIRATORY W GRAM STAIN

## 2012-07-30 LAB — CULTURE, RESPIRATORY: Culture: NORMAL

## 2012-07-30 MED FILL — Heparin Sodium (Porcine) Inj 1000 Unit/ML: INTRAMUSCULAR | Qty: 30 | Status: AC

## 2012-07-30 MED FILL — Sodium Chloride IV Soln 0.9%: INTRAVENOUS | Qty: 1000 | Status: AC

## 2012-08-01 ENCOUNTER — Encounter (HOSPITAL_COMMUNITY): Payer: Self-pay | Admitting: Orthopedic Surgery

## 2012-08-01 NOTE — Discharge Summary (Signed)
Patient ID: Elijah Marshall MRN: [id number] DOB/AGE: 1950-10-28 62 y.o.  Admit date: 07/25/2012 Discharge date: 07/28/2012  Admission Diagnoses: Radiculopathy  Discharge Diagnoses:  Same  Past Medical History  Diagnosis Date  . Allergy   . Ulcer   . COPD (chronic obstructive pulmonary disease)     per patient's health survey - he put a ?  . GERD (gastroesophageal reflux disease)   . Arthritis   . Shortness of breath     Surgeries: Procedure(s): ANTERIOR LATERAL LUMBAR FUSION 1 LEVEL L3-4 XLIF on 07/25/2012   Consultants: [facility]   Discharged Condition: Improved  Hospital Course: Elijah Marshall is an 63 y.o. male who was admitted 07/25/2012 for operative treatment of left radiculopathy. Patient has severe unremitting pain that affects sleep, daily activities, and work/hobbies. After pre-op clearance the patient was taken to the operating room on 07/25/2012 and underwent  Procedure(s): ANTERIOR LATERAL LUMBAR FUSION 1 LEVEL L3-4 XLIF.    Patient was given perioperative antibiotics:  Anti-infectives   Start     Dose/Rate Route Frequency Ordered Stop   07/28/12 0000  levofloxacin (LEVAQUIN) 750 MG tablet     750 mg Oral Daily 07/28/12 1507     07/27/12 1800  levofloxacin (LEVAQUIN) IVPB 500 mg  Status:  Discontinued     500 mg 100 mL/hr over 60 Minutes Intravenous Every 24 hours 07/27/12 0812 07/27/12 0851   07/27/12 1800  levofloxacin (LEVAQUIN) IVPB 750 mg  Status:  Discontinued     750 mg 100 mL/hr over 90 Minutes Intravenous Every 24 hours 07/27/12 0851 07/28/12 2109   07/27/12 0900  ceFEPIme (MAXIPIME) 1 g in dextrose 5 % 50 mL IVPB  Status:  Discontinued     1 g 100 mL/hr over 30 Minutes Intravenous 3 times per day 07/27/12 0812 07/28/12 2109   07/27/12 0900  vancomycin (VANCOCIN) IVPB 1000 mg/200 mL premix  Status:  Discontinued     1,000 mg 200 mL/hr over 60 Minutes Intravenous Every 8 hours 07/27/12 0851 07/28/12 2109   07/26/12 1645  levofloxacin  (LEVAQUIN) tablet 500 mg  Status:  Discontinued     500 mg Oral Daily 07/26/12 1637 07/27/12 0812   07/25/12 1430  ceFAZolin (ANCEF) 1-5 GM-% IVPB    Comments:  YU, HYANG SOOK: cabinet override      07/25/12 1430 07/26/12 0229   07/25/12 1230  ceFAZolin (ANCEF) IVPB 1 g/50 mL premix     1 g 100 mL/hr over 30 Minutes Intravenous Every 8 hours 07/25/12 1215 07/25/12 2236   07/25/12 0600  ceFAZolin (ANCEF) IVPB 2 g/50 mL premix     2 g 100 mL/hr over 30 Minutes Intravenous On call to O.R. 07/24/12 1421 07/25/12 0747       Patient was given sequential compression devices, early ambulation, to prevent DVT.  Patient benefited maximally from hospital stay and there were no complications.    Recent vital signs: BP 147/81  Pulse 93  Temp(Src) 97.8 F (36.6 C) (Oral)  Resp 18  Ht 5\' 8"  (1.727 m)  Wt 87.3 kg (192 lb 7.4 oz)  BMI 29.27 kg/m2  SpO2 100%    Recent laboratory studies: CBC    Component Value Date/Time   WBC 10.3 07/28/2012 0445   RBC 5.04 07/28/2012 0445   HGB 15.0 07/28/2012 0445   HCT 43.2 07/28/2012 0445   PLT 186 07/28/2012 0445   MCV 85.7 07/28/2012 0445   MCH 29.8 07/28/2012 0445   MCHC 34.7 07/28/2012 0445  RDW 13.2 07/28/2012 0445   LYMPHSABS 2.0 07/18/2012 1331   MONOABS 0.5 07/18/2012 1331   EOSABS 0.1 07/18/2012 1331   BASOSABS 0.0 07/18/2012 1331      Discharge Medications:     Medication List    STOP taking these medications       cyclobenzaprine 10 MG tablet  Commonly known as:  FLEXERIL     HYDROcodone-acetaminophen 5-325 MG per tablet  Commonly known as:  NORCO/VICODIN      TAKE these medications       albuterol 108 (90 BASE) MCG/ACT inhaler  Commonly known as:  PROVENTIL HFA;VENTOLIN HFA  Inhale 2 puffs into the lungs every 6 (six) hours as needed for wheezing.     budesonide-formoterol 160-4.5 MCG/ACT inhaler  Commonly known as:  SYMBICORT  Inhale 2 puffs into the lungs 2 (two) times daily.     cetirizine 10 MG tablet  Commonly known  as:  ZYRTEC  Take 1 tablet (10 mg total) by mouth daily.     fluticasone 50 MCG/ACT nasal spray  Commonly known as:  FLONASE  Place 2 sprays into the nose daily.     levofloxacin 750 MG tablet  Commonly known as:  LEVAQUIN  Take 1 tablet (750 mg total) by mouth daily.     omeprazole 40 MG capsule  Commonly known as:  PRILOSEC  Take 1 capsule (40 mg total) by mouth daily.     predniSONE 20 MG tablet  Commonly known as:  DELTASONE  Take 2 tabs by mouth daily X 2 days, then start taking 1 tablet by mouth daily X 2 days; then start taking 1/2 tablet by mouth daily X 3 days and stop prednisone.     tiotropium 18 MCG inhalation capsule  Commonly known as:  SPIRIVA HANDIHALER  Place 1 capsule (18 mcg total) into inhaler and inhale daily.        Diagnostic Studies: Dg Chest 2 View  07/26/2012   *RADIOLOGY REPORT*  Clinical Data: Short of breath  CHEST - 2 VIEW  Comparison: [phone]  Findings: There is mild air space disease in the right lower lobe which is new from prior.  Mild atelectasis at the left lung base. No pneumothorax.  No pulmonary edema.  IMPRESSION: Concern for right lower lobe pneumonia.  Call report   Original Report Authenticated By: Genevive Bi, M.D.   Dg Chest 2 View  07/18/2012   *RADIOLOGY REPORT*  Clinical Data: Preop.  CHEST - 2 VIEW  Comparison: 07/12/2010.  Findings: Trachea is midline.  Heart size normal.  Lungs are hyperinflated but clear.  No pleural fluid.  IMPRESSION: Hyperinflation without acute finding.   Original Report Authenticated By: Leanna Battles, M.D.   Dg Lumbar Spine 2-3 Views  07/25/2012   *RADIOLOGY REPORT*  Clinical Data: L3 - L4 XLIF with lateral pleating and screw fixation  DG C-ARM 61-120 MIN, LUMBAR SPINE - 2-3 VIEW  Comparison:  Lumbar spine MRI - 08/14/2011.  Fluoroscopy time:  1 minute, 49 seconds  Findings:  An AP and lateral spot intraoperative radiographic images of the lower lumbar spine is provided for review.  Images are slightly  degraded secondary to coned field of view.  Lumbar spine labeling is based on preexisting surgical hardware seen on preprocedural lumbar spine MRI performed 08/14/2011.  Previously identified L4 - L5 paraspinal fusion hardware appears grossly unchanged.  Interval left lateral side plate fixation of L3 - L4 with associated intervertebral disc space replacement.  No definite evidence of  hardware failure or loosening.  Apparent restoration of the L3 - L4 intervertebral disc space height.  IMPRESSION: 1.  Post L3 - L4 left lateral plate fixation and intervertebral disc space replacement. 2.  Grossly stable sequela of L4 and L5 paraspinal fusion.   Original Report Authenticated By: Tacey Ruiz, MD   Dg C-arm [phone] Min  07/25/2012   *RADIOLOGY REPORT*  Clinical Data: L3 - L4 XLIF with lateral pleating and screw fixation  DG C-ARM 61-120 MIN, LUMBAR SPINE - 2-3 VIEW  Comparison:  Lumbar spine MRI - 08/14/2011.  Fluoroscopy time:  1 minute, 49 seconds  Findings:  An AP and lateral spot intraoperative radiographic images of the lower lumbar spine is provided for review.  Images are slightly degraded secondary to coned field of view.  Lumbar spine labeling is based on preexisting surgical hardware seen on preprocedural lumbar spine MRI performed 08/14/2011.  Previously identified L4 - L5 paraspinal fusion hardware appears grossly unchanged.  Interval left lateral side plate fixation of L3 - L4 with associated intervertebral disc space replacement.  No definite evidence of hardware failure or loosening.  Apparent restoration of the L3 - L4 intervertebral disc space height.  IMPRESSION: 1.  Post L3 - L4 left lateral plate fixation and intervertebral disc space replacement. 2.  Grossly stable sequela of L4 and L5 paraspinal fusion.   Original Report Authenticated By: Tacey Ruiz, MD    Disposition: 01-Home or Self Care  1. PO Day #3 S/P XLIF (Lateral Interbody Fusion) L3-4 for L L3 Radiculopathy  2. SOB. Chest xray  shows possible lower lobe pneumonia - per [facility]  -Expected PO LBP, well controlled. Leg pain resolved.  -Psoas discomfort PO, not unexpected post XLIF  -TLSO brace at all times when OOB  -Written Rx's in chart for D/C  -Home today  -Cont SCD's  -F/U in office 2 weeks for PO    Signed: Georga Bora 08/01/2012, 10:30 AM

## 2012-09-16 ENCOUNTER — Ambulatory Visit: Payer: BC Managed Care – PPO

## 2012-09-16 ENCOUNTER — Ambulatory Visit (INDEPENDENT_AMBULATORY_CARE_PROVIDER_SITE_OTHER): Payer: BC Managed Care – PPO | Admitting: Family Medicine

## 2012-09-16 VITALS — BP 140/88 | HR 90 | Temp 98.0°F | Resp 18 | Ht 68.5 in | Wt 178.0 lb

## 2012-09-16 DIAGNOSIS — L853 Xerosis cutis: Secondary | ICD-10-CM

## 2012-09-16 DIAGNOSIS — R0789 Other chest pain: Secondary | ICD-10-CM

## 2012-09-16 DIAGNOSIS — R109 Unspecified abdominal pain: Secondary | ICD-10-CM

## 2012-09-16 DIAGNOSIS — R0781 Pleurodynia: Secondary | ICD-10-CM

## 2012-09-16 DIAGNOSIS — Z79899 Other long term (current) drug therapy: Secondary | ICD-10-CM

## 2012-09-16 DIAGNOSIS — R079 Chest pain, unspecified: Secondary | ICD-10-CM

## 2012-09-16 DIAGNOSIS — K5900 Constipation, unspecified: Secondary | ICD-10-CM

## 2012-09-16 LAB — POCT CBC
Granulocyte percent: 62.1 %G (ref 37–80)
HCT, POC: 50.9 % (ref 43.5–53.7)
Hemoglobin: 16.4 g/dL (ref 14.1–18.1)
Lymph, poc: 1.8 (ref 0.6–3.4)
MCH, POC: 30 pg (ref 27–31.2)
MCHC: 32.2 g/dL (ref 31.8–35.4)
MCV: 93.3 fL (ref 80–97)
MID (cbc): 0.4 (ref 0–0.9)
MPV: 8.1 fL (ref 0–99.8)
POC Granulocyte: 3.6 (ref 2–6.9)
POC LYMPH PERCENT: 30.2 %L (ref 10–50)
POC MID %: 7.7 %M (ref 0–12)
Platelet Count, POC: 234 10*3/uL (ref 142–424)
RBC: 5.46 M/uL (ref 4.69–6.13)
RDW, POC: 14.6 %
WBC: 5.8 10*3/uL (ref 4.6–10.2)

## 2012-09-16 MED ORDER — SIMETHICONE 125 MG PO TABS: 120.0000 mg | ORAL_TABLET | Freq: Four times a day (QID) | ORAL | Status: DC

## 2012-09-16 MED ORDER — POLYETHYLENE GLYCOL 3350 17 GM/SCOOP PO POWD: 17.0000 g | Freq: Two times a day (BID) | ORAL | Status: DC | PRN

## 2012-09-16 NOTE — Progress Notes (Signed)
Subjective:    Patient ID: Elijah Marshall, male    DOB: March 21, 1950, 62 y.o.   MRN: [id number] Chief Complaint  Patient presents with  . pain under ribs on right side    x2 days   . dry skin x4 weeks    HPI  Did have a lumbar repair about 2 months ago - is doing a lot of walking but no formal PT.  Was wearing a brace but was able to come out of it several day ago after 5-6 wks.  During his post-op recovery in the hosp, he was treated for pneumonia.  For the past several days he has developed a soreness in his RUQ abd that is sore, tender to palpation and with bending over, better when leaning back.  Feels like a pocket of gas stuck.  BM have been consistent but has been straining more. Has been using some miralax nightly though forgot for the past few d. Passing flatus and belching but w/o relief. No h/o gallblladder problems.  Did feel some nausea today so has not eaten very much today.  Has had some bilateral chest pains - but not with coughing or deep breathing, not tender to the touch.  Just took his symbicort and it makes him hoarse. Cough at baseline and occ productive a sputum.   Past Medical History  Diagnosis Date  . Allergy   . Ulcer   . COPD (chronic obstructive pulmonary disease)     per patient's health survey - he put a ?  . GERD (gastroesophageal reflux disease)   . Arthritis   . Shortness of breath    Current Outpatient Prescriptions on File Prior to Visit  Medication Sig Dispense Refill  . albuterol (PROVENTIL HFA;VENTOLIN HFA) 108 (90 BASE) MCG/ACT inhaler Inhale 2 puffs into the lungs every 6 (six) hours as needed for wheezing.  1 Inhaler  11  . budesonide-formoterol (SYMBICORT) 160-4.5 MCG/ACT inhaler Inhale 2 puffs into the lungs 2 (two) times daily.  1 Inhaler  1  . cetirizine (ZYRTEC) 10 MG tablet Take 1 tablet (10 mg total) by mouth daily.  30 tablet  11  . fluticasone (FLONASE) 50 MCG/ACT nasal spray Place 2 sprays into the nose daily.  16 g  5  . omeprazole  (PRILOSEC) 40 MG capsule Take 1 capsule (40 mg total) by mouth daily.  90 capsule  1  . tiotropium (SPIRIVA HANDIHALER) 18 MCG inhalation capsule Place 1 capsule (18 mcg total) into inhaler and inhale daily.  90 capsule  4   No current facility-administered medications on file prior to visit.   Allergies  Allergen Reactions  . Aspirin Nausea And Vomiting  . Morphine And Related Itching    Review of Systems  Constitutional: Positive for appetite change. Negative for fever, chills, diaphoresis and activity change.  HENT: Positive for voice change.   Respiratory: Positive for cough. Negative for chest tightness, shortness of breath and wheezing.   Cardiovascular: Positive for chest pain.  Gastrointestinal: Positive for nausea, abdominal pain, constipation and abdominal distention. Negative for vomiting, diarrhea, blood in stool, anal bleeding and rectal pain.  Genitourinary: Negative for dysuria, frequency and decreased urine volume.  Musculoskeletal: Positive for arthralgias and back pain.  Skin: Positive for rash. Negative for color change and wound.  Hematological: Negative for adenopathy.       BP 140/88  Pulse 90  Temp(Src) 98 F (36.7 C) (Oral)  Resp 18  Ht 5' 8.5" (1.74 m)  Wt 178 lb (80.74  kg)  BMI 26.67 kg/m2  SpO2 94% Objective:   Physical Exam  Constitutional: He is oriented to person, place, and time. He appears well-developed and well-nourished. No distress.  HENT:  Head: Normocephalic and atraumatic.  Eyes: Conjunctivae are normal. Pupils are equal, round, and reactive to light. No scleral icterus.  Neck: Normal range of motion. Neck supple. No thyromegaly present.  Cardiovascular: Normal rate, regular rhythm, normal heart sounds and intact distal pulses.   Pulmonary/Chest: Effort normal and breath sounds normal. No respiratory distress.  Abdominal: Soft. He exhibits no distension and no mass. Bowel sounds are increased. There is no hepatosplenomegaly. There is no  tenderness. There is no rebound, no guarding, no CVA tenderness and negative Murphy's sign.  Musculoskeletal: He exhibits no edema.  Lymphadenopathy:    He has no cervical adenopathy.  Neurological: He is alert and oriented to person, place, and time.  Skin: Skin is warm and dry. He is not diaphoretic.  Psychiatric: He has a normal mood and affect. His behavior is normal.      Results for orders placed in visit on 09/16/12  POCT CBC      Result Value Range   WBC 5.8  4.6 - 10.2 K/uL   Lymph, poc 1.8  0.6 - 3.4   POC LYMPH PERCENT 30.2  10 - 50 %L   MID (cbc) 0.4  0 - 0.9   POC MID % 7.7  0 - 12 %M   POC Granulocyte 3.6  2 - 6.9   Granulocyte percent 62.1  37 - 80 %G   RBC 5.46  4.69 - 6.13 M/uL   Hemoglobin 16.4  14.1 - 18.1 g/dL   HCT, POC 19.1  47.8 - 53.7 %   MCV 93.3  80 - 97 fL   MCH, POC 30.0  27 - 31.2 pg   MCHC 32.2  31.8 - 35.4 g/dL   RDW, POC 29.5     Platelet Count, POC 234  142 - 424 K/uL   MPV 8.1  0 - 99.8 fL   [facility] reading (PRIMARY) by  Dr. Clelia Croft. Acute abd: Moderate stool burden, large amount of bowel gas throughout though no distension and non-specific pattern. EKG: NSR, no ischemic changes. Assessment & Plan:   Rib pain on right side - Plan: EKG 12-Lead, DG Abd Acute W/Chest, POCT CBC, Comprehensive metabolic panel - suspect might be due to gas in the hepatic flexure - start miralax and gas-x. If sxs cont, RTC and cons abd [place] for further eval.  Abdominal pain, unspecified site - Plan: DG Abd Acute W/Chest, POCT CBC, Comprehensive metabolic panel  Dry skin dermatitis  Unspecified constipation - start miralax.  Encounter for long-term (current) use of other medications  Meds ordered this encounter  Medications  . diazepam (VALIUM) 5 MG tablet    Sig: Take 5 mg by mouth every 6 (six) hours as needed for anxiety.  Marland Kitchen HYDROcodone-acetaminophen (NORCO) 10-325 MG per tablet    Sig: Take 1 tablet by mouth every 6 (six) hours as needed for pain.  .  polyethylene glycol powder (GLYCOLAX/MIRALAX) powder    Sig: Take 17 g by mouth 2 (two) times daily as needed.    Dispense:  527 g    Refill:  1  . Simethicone 125 MG TABS    Sig: Take 0.96 tablets (120 mg total) by mouth 4 (four) times daily.    Dispense:  40 tablet    Refill:  0    Norberto Sorenson,  MD MPH

## 2012-09-16 NOTE — Patient Instructions (Addendum)
Double up on your miralax and try taking the simethicone (gas-x) four times a day. If you are getting better or having any additional symptoms including pain after eating or more nausea, we should consider obtaining an ultrasound of your liver and gallbladder.

## 2012-09-17 LAB — COMPREHENSIVE METABOLIC PANEL
ALT: 17 U/L (ref 0–53)
AST: 22 U/L (ref 0–37)
Albumin: 4.6 g/dL (ref 3.5–5.2)
Alkaline Phosphatase: 106 U/L (ref 39–117)
BUN: 7 mg/dL (ref 6–23)
CO2: 28 mEq/L (ref 19–32)
Calcium: 9.8 mg/dL (ref 8.4–10.5)
Chloride: 102 mEq/L (ref 96–112)
Creat: 1.01 mg/dL (ref 0.50–1.35)
Glucose, Bld: 97 mg/dL (ref 70–99)
Potassium: 4.4 mEq/L (ref 3.5–5.3)
Sodium: 137 mEq/L (ref 135–145)
Total Bilirubin: 0.7 mg/dL (ref 0.3–1.2)
Total Protein: 7.3 g/dL (ref 6.0–8.3)

## 2012-09-18 ENCOUNTER — Encounter: Payer: Self-pay | Admitting: Family Medicine

## 2012-10-04 ENCOUNTER — Telehealth: Payer: Self-pay

## 2012-10-04 NOTE — Telephone Encounter (Signed)
Dr. Clelia Croft,  Pt. Is hoping that you can call him back today. He is very hoarse and he believes that his COPD medication is causing it.  You can reach him at [phone] or [phone]

## 2012-10-05 NOTE — Telephone Encounter (Signed)
The symbicort could cause hoarseness. It is ok to stop and do lots of salt water gargles and mouthwash rinses. If he is having any sore throat or sees any white spots on his tongue or mouth, should come in for further eval as it could cause a yeast infection in the mouth.

## 2012-10-07 NOTE — Telephone Encounter (Signed)
Patient notified and voiced understanding.

## 2012-10-30 ENCOUNTER — Ambulatory Visit: Payer: BC Managed Care – PPO | Admitting: Family Medicine

## 2012-10-30 VITALS — BP 132/92 | HR 109 | Temp 98.5°F | Resp 20 | Ht 67.5 in | Wt 175.0 lb

## 2012-10-30 DIAGNOSIS — J449 Chronic obstructive pulmonary disease, unspecified: Secondary | ICD-10-CM

## 2012-10-30 DIAGNOSIS — J4489 Other specified chronic obstructive pulmonary disease: Secondary | ICD-10-CM

## 2012-10-30 DIAGNOSIS — J310 Chronic rhinitis: Secondary | ICD-10-CM | POA: Insufficient documentation

## 2012-10-30 DIAGNOSIS — J019 Acute sinusitis, unspecified: Secondary | ICD-10-CM

## 2012-10-30 DIAGNOSIS — J329 Chronic sinusitis, unspecified: Secondary | ICD-10-CM

## 2012-10-30 MED ORDER — AZITHROMYCIN 250 MG PO TABS: ORAL_TABLET | ORAL | Status: DC

## 2012-10-30 MED ORDER — METHYLPREDNISOLONE ACETATE 80 MG/ML IJ SUSP
80.0000 mg | Freq: Once | INTRAMUSCULAR | Status: AC
  Administered 2012-10-30: 80 mg via INTRAMUSCULAR

## 2012-10-30 NOTE — Progress Notes (Signed)
Subjective: Patient started feeling a little bad on Sunday. Last night he had a bad sore throat and a lot of head congestion. He had sinus pressure. He's been coughing, not bringing up much. He is blowing clear or whitish mucus, nothing purulent yet. He came in early because he wanted to make sure this didn't flare his COPD. He continues to use fluticasone nose spray once daily. He also uses cetirizine one daily. Bad sore throat last night.  Objective: Congested but not in any major distress. TMs normal. Sinuses only minimally tender. Nose congested. Throat not erythematous though he has a bad sore throat. Neck was supple without significant nodes. Chest does not have any wheezing or rhonchi. Heart regular.  Assessment: History chronic sinusitis with acute sinusitis flare from a URI COPD  Plan: Treat with a shot of Depo-Medrol to try and open his sinuses up. Increase the fluticasone spray. Hold off on antibiotics at this time. If he is not improving at all but 2 days from now he can go ahead and fill a Zithromax and prescription, but I do not believe that'll be needed. This acts more viral at this time.

## 2012-10-30 NOTE — Patient Instructions (Addendum)
Take Zyrtec (citirazine one daily)  Use fluticsone 2 sprays each nostril for 3-4 days  If not much improved by Friday begin azithromycin  Drink plenty of fluids

## 2012-12-13 ENCOUNTER — Other Ambulatory Visit: Payer: Self-pay | Admitting: Emergency Medicine

## 2012-12-17 ENCOUNTER — Other Ambulatory Visit: Payer: Self-pay | Admitting: Family Medicine

## 2012-12-18 ENCOUNTER — Ambulatory Visit (INDEPENDENT_AMBULATORY_CARE_PROVIDER_SITE_OTHER): Payer: BC Managed Care – PPO | Admitting: Family Medicine

## 2012-12-18 ENCOUNTER — Ambulatory Visit: Payer: BC Managed Care – PPO

## 2012-12-18 VITALS — BP 122/84 | HR 88 | Temp 98.2°F | Resp 20 | Ht 67.25 in | Wt 174.2 lb

## 2012-12-18 DIAGNOSIS — R1011 Right upper quadrant pain: Secondary | ICD-10-CM

## 2012-12-18 DIAGNOSIS — L309 Dermatitis, unspecified: Secondary | ICD-10-CM

## 2012-12-18 DIAGNOSIS — K219 Gastro-esophageal reflux disease without esophagitis: Secondary | ICD-10-CM

## 2012-12-18 DIAGNOSIS — L259 Unspecified contact dermatitis, unspecified cause: Secondary | ICD-10-CM

## 2012-12-18 DIAGNOSIS — K5900 Constipation, unspecified: Secondary | ICD-10-CM

## 2012-12-18 LAB — POCT SKIN KOH: Skin KOH, POC: NEGATIVE

## 2012-12-18 MED ORDER — SUCRALFATE 1 G PO TABS: 1.0000 g | ORAL_TABLET | Freq: Three times a day (TID) | ORAL | Status: DC

## 2012-12-18 MED ORDER — DEXLANSOPRAZOLE 60 MG PO CPDR: 60.0000 mg | DELAYED_RELEASE_CAPSULE | Freq: Every day | ORAL | Status: DC

## 2012-12-18 MED ORDER — TRIAMCINOLONE ACETONIDE 0.1 % EX CREA: 1.0000 "application " | TOPICAL_CREAM | Freq: Three times a day (TID) | CUTANEOUS | Status: DC

## 2012-12-18 NOTE — Patient Instructions (Signed)
Switch to a non-abrasive detergent on your clothes and make sure to use a gentle soap.  "All detergent - free and clear" tends to work well as well as dove soap products.  Stop using all mint and menthol products, stop carbonated beverages, stop gum.  Start carafate just until we can get the dexilant approved by your insurance.  If the pain worsens or continues, I would recommend coming back into clinic to see me (or feel free to make an appt) so we can do blood work for ulcers and check on your heart and lungs again. The next step will also be to get an ultrasound of your gallbladder and see if you need to pay a visit back to Dr. Elnoria Howard.  Continue taking the miralax nightly.  Diet for Gastroesophageal Reflux Disease, Adult Reflux (acid reflux) is when acid from your stomach flows up into the esophagus. When acid comes in contact with the esophagus, the acid causes irritation and soreness (inflammation) in the esophagus. When reflux happens often or so severely that it causes damage to the esophagus, it is called gastroesophageal reflux disease (GERD). Nutrition therapy can help ease the discomfort of GERD. FOODS OR DRINKS TO AVOID OR LIMIT  Smoking or chewing tobacco. Nicotine is one of the most potent stimulants to acid production in the gastrointestinal tract.  Caffeinated and decaffeinated coffee and black tea.  Regular or low-calorie carbonated beverages or energy drinks (caffeine-free carbonated beverages are allowed).   Strong spices, such as black pepper, white pepper, red pepper, cayenne, curry powder, and chili powder.  Peppermint or spearmint.  Chocolate.  High-fat foods, including meats and fried foods. Extra added fats including oils, butter, salad dressings, and nuts. Limit these to less than 8 tsp per day.  Fruits and vegetables if they are not tolerated, such as citrus fruits or tomatoes.  Alcohol.  Any food that seems to aggravate your condition. If you have questions  regarding your diet, call your caregiver or a registered dietitian. OTHER THINGS THAT MAY HELP GERD INCLUDE:   Eating your meals slowly, in a relaxed setting.  Eating 5 to 6 small meals per day instead of 3 large meals.  Eliminating food for a period of time if it causes distress.  Not lying down until 3 hours after eating a meal.  Keeping the head of your bed raised 6 to 9 inches (15 to 23 cm) by using a foam wedge or blocks under the legs of the bed. Lying flat may make symptoms worse.  Being physically active. Weight loss may be helpful in reducing reflux in overweight or obese adults.  Wear loose fitting clothing EXAMPLE MEAL PLAN This meal plan is approximately 2,000 calories based on https://www.[facility].org/ meal planning guidelines. Breakfast   cup cooked oatmeal.  1 cup strawberries.  1 cup low-fat milk.  1 oz almonds. Snack  1 cup cucumber slices.  6 oz yogurt (made from low-fat or fat-free milk). Lunch  2 slice whole-wheat bread.  2 oz sliced [place].  2 tsp mayonnaise.  1 cup blueberries.  1 cup snap peas. Snack  6 whole-wheat crackers.  1 oz string cheese. Dinner   cup brown rice.  1 cup mixed veggies.  1 tsp olive oil.  3 oz grilled fish. Document Released: 01/23/2005 Document Revised: 04/17/2011 Document Reviewed: 12/09/2010 [facility] Patient Information 2014 [place], [place].

## 2012-12-18 NOTE — Progress Notes (Addendum)
Subjective:    Patient ID: Elijah Marshall, male    DOB: 02-18-1950, 62 y.o.   MRN: [id number] This chart was scribed for Elijah Sorenson, MD by Clydene Laming, ED Scribe. This patient was seen in room 4 and the patient's care was started at 7:15PM. HPI Chief Complaint  Patient presents with  . rib cage pain    pain is located on the right side under his rib cage, started for over 1 week now  . Rash    located on stomach   HPI Comments: Elijah Marshall is a 62 y.o. male who presents to [facility] complaining of right-sided lower rib pain which has been constant for the last week. He has been in PT for his back so wonders if he could have pulled something as it was initially sore to touch. He was seen for similar pain 3 mos ago that was attributed to gas and constipation in the hepatic flexure so he did restart the gas-x and miralax for the past few d w/o much benefit (other than some mild constipation which did seem to resolve). Pt reports a decreased appetite. Pt is not experiencing emesis. There is some mild nausea. Pt is not having any urinary symptoms. His mother has a hx of colon cancer. His last colonoscopy was in May and performed by Dr. Elnoria Howard. He removed several polyps and was reccommended for a recheck in five years.  In the past he has tried omeprazole for gerd and is currently taking zantac without relief. Pt has a hx of gastric ulcers. States he is having burning in his chest and feels much better after he belches but that it is not happening naturally - he has to force up the belch.  Pt also reports a rash which appeared two weeks ago on abdomen around umbilicus.  Pt was diagnosed with copd in the past year.   Pt is with recent lumbar and cervical spinal fusions.   Past Medical History  Diagnosis Date  . Allergy   . Ulcer   . COPD (chronic obstructive pulmonary disease)     per patient's health survey - he put a ?  . GERD (gastroesophageal reflux disease)   . Arthritis   . Shortness of  breath    Current Outpatient Prescriptions on File Prior to Visit  Medication Sig Dispense Refill  . albuterol (PROVENTIL HFA;VENTOLIN HFA) 108 (90 BASE) MCG/ACT inhaler Inhale 2 puffs into the lungs every 6 (six) hours as needed for wheezing.  1 Inhaler  11  . budesonide-formoterol (SYMBICORT) 160-4.5 MCG/ACT inhaler Inhale 2 puffs into the lungs 2 (two) times daily.  1 Inhaler  1  . cetirizine (ZYRTEC) 10 MG tablet Take 1 tablet (10 mg total) by mouth daily.  30 tablet  11  . diazepam (VALIUM) 5 MG tablet Take 5 mg by mouth every 6 (six) hours as needed for anxiety.      . fluticasone (FLONASE) 50 MCG/ACT nasal spray Place 2 sprays into the nose daily.  16 g  5  . fluticasone (FLONASE) 50 MCG/ACT nasal spray USE 2 SPRAYS IN EACH NOSTRIL ONCE A DAY  16 g  9  . HYDROcodone-acetaminophen (NORCO) 10-325 MG per tablet Take 1 tablet by mouth every 6 (six) hours as needed for pain.      . meloxicam (MOBIC) 15 MG tablet Take 15 mg by mouth daily.      . polyethylene glycol powder (GLYCOLAX/MIRALAX) powder Take 17 g by mouth 2 (two) times daily as  needed.  527 g  1  . Simethicone 125 MG TABS Take 0.96 tablets (120 mg total) by mouth 4 (four) times daily.  40 tablet  0  . tiotropium (SPIRIVA HANDIHALER) 18 MCG inhalation capsule Place 1 capsule (18 mcg total) into inhaler and inhale daily.  90 capsule  4  . azithromycin (ZITHROMAX) 250 MG tablet Take 2 initially, then one daily for 4 days  6 tablet  0   No current facility-administered medications on file prior to visit.   .   Allergies  Allergen Reactions  . Aspirin Nausea And Vomiting  . Morphine And Related Itching    Review of Systems  Constitutional: Positive for appetite change. Negative for fever, chills, diaphoresis, activity change and unexpected weight change.  Respiratory: Positive for chest tightness. Negative for cough and shortness of breath.   Cardiovascular: Negative for chest pain.  Gastrointestinal: Positive for nausea,  abdominal pain and constipation. Negative for vomiting, diarrhea, blood in stool, abdominal distention, anal bleeding and rectal pain.  Genitourinary: Negative for dysuria, frequency and decreased urine volume.  Musculoskeletal: Positive for arthralgias, back pain and neck pain. Negative for gait problem and joint swelling.       Rib cage pain  Skin: Positive for rash.  Hematological: Negative for adenopathy.  Psychiatric/Behavioral: The patient is not nervous/anxious.       BP 122/84  Pulse 88  Temp(Src) 98.2 F (36.8 C) (Oral)  Resp 20  Ht 5' 7.25" (1.708 m)  Wt 174 lb 3.2 oz (79.017 kg)  BMI 27.09 kg/m2  SpO2 97% Objective:   Physical Exam  Nursing note and vitals reviewed. Constitutional: He is oriented to person, place, and time. He appears well-developed and well-nourished. No distress.  HENT:  Head: Normocephalic and atraumatic.  Eyes: EOM are normal.  Neck: Normal range of motion. Neck supple. No tracheal deviation present. No thyromegaly present.  Cardiovascular: Normal rate, regular rhythm and normal heart sounds.   Pulmonary/Chest: Effort normal and breath sounds normal. No respiratory distress.  Abdominal: Soft. Normal appearance. He exhibits no distension, no pulsatile midline mass and no mass. Bowel sounds are increased. There is no hepatosplenomegaly. There is tenderness in the right upper quadrant and left upper quadrant. There is no rigidity, no rebound, no guarding, no CVA tenderness, no tenderness at McBurney's point and negative Murphy's sign. No hernia.      Genitourinary: Rectum normal and prostate normal. Rectal exam shows no tenderness and anal tone normal. Guaiac negative stool.  Musculoskeletal: Normal range of motion.  Lymphadenopathy:    He has no cervical adenopathy.  Neurological: He is alert and oriented to person, place, and time.  Skin: Skin is warm and dry. Rash noted. Rash is macular. He is not diaphoretic.  Mild eryth plaque surrounding  umbilicas right greater than left with central scaling   Psychiatric: He has a normal mood and affect. His behavior is normal.   Results for orders placed in visit on 12/18/12  POCT SKIN KOH      Result Value Range   Skin KOH, POC Negative      [facility] reading (PRIMARY) by  Dr. Clelia Croft. 2-view abd:  Mod amount of gas in non-specific pattern.  No sig stool burden No rib abnormality seen at "marker" of pain Lumbar fusion hardware intact     CLINICAL DATA: Abdominal pain. Right upper quadrant pain.  EXAM: ABDOMEN - 2 VIEW  COMPARISON: Acute abdominal series 09/16/2012.  FINDINGS: The bowel gas pattern is unremarkable. There is no  evidence for obstruction or free air. Postsurgical changes in the lumbar spine are stable.  IMPRESSION: No acute abnormality or significant interval change.  Assessment & Plan:  Pt needs pneumovax, fasting lipid panel and fasting glucose at next OV.  Abdominal pain, right upper quadrant - Plan: DG Abd 2 Views, cont miralax, restart ppi. If sxs cont, rec return to clinic for labs including H. Pylori and consider referral for gallbladder US as pt does report dec appetite and nausea.  Does NOT seem to be located over rib and ribs not ttp on exam today -pain today located distal to lower right ribs.  Dermatitis due to unknown cause -POCT Skin KOH - pt suspects it is the clorox bleach his tshirts are washed in - seems a likely source so advised steroid therapy and removal of harsh detergents/soaps. KOH neg.  GERD (gastroesophageal reflux disease) - not controlled by zantac currently and failed omeprazole in the past. Try dexilant but will of course need a PA so try carafate in the meantime  Unspecified constipation - cont miralax qd and increase to bid prn.  Meds ordered this encounter  Medications  . triamcinolone cream (KENALOG) 0.1 %    Sig: Apply 1 application topically 3 (three) times daily.    Dispense:  85.2 g    Refill:  1  . sucralfate (CARAFATE) 1  G tablet    Sig: Take 1 tablet (1 g total) by mouth 4 (four) times daily -  with meals and at bedtime.    Dispense:  120 tablet    Refill:  0  . dexlansoprazole (DEXILANT) 60 MG capsule    Sig: Take 1 capsule (60 mg total) by mouth daily.    Dispense:  30 capsule    Refill:  5    I personally performed the services described in this documentation, which was scribed in my presence. The recorded information has been reviewed and considered, and addended by me as needed.  Elijah Sorenson, MD MPH

## 2012-12-28 ENCOUNTER — Telehealth: Payer: Self-pay

## 2012-12-28 NOTE — Telephone Encounter (Signed)
Can we send in something else for him?

## 2012-12-28 NOTE — Telephone Encounter (Signed)
Patient states that the Kenalog cream he was prescribed is not helping his rash. Patient is still experiencing some itching and irritation.  [phone]

## 2012-12-30 NOTE — Telephone Encounter (Signed)
Spoke to him, he states it has improved some, he states he is in process of changing the laundering process of his shirts. He indicates he thinks this may be related to the hydrocodone, but he has been taking this for a long duration. He states he will continue the cream then call if it is not better, he will give this a couple of more days.

## 2012-12-30 NOTE — Telephone Encounter (Signed)
Has he stopped using bleach to wash his shirts as Dr. Clelia Croft suggested?  Has he had any improvement?  If not, could try an antifungal cream.  If no improvement with that, would recommend he RTC for further eval

## 2013-01-10 ENCOUNTER — Telehealth: Payer: Self-pay

## 2013-01-10 NOTE — Telephone Encounter (Signed)
Patient is needing to speak with Dr Clelia Croft regarding his medicine.  Patient stated that Dr Clelia Croft put him on this medicine and it is not working.   Best#: [phone] or his cell#: [phone]

## 2013-01-10 NOTE — Telephone Encounter (Signed)
Spoke with patient and the rash is not healing.  It is spreading a little.  He does still have welps and the pain.  The hydrocodone is easing pain but tenderness is still there.  He would like to know is there something else he could do for the rash.

## 2013-01-13 NOTE — Telephone Encounter (Signed)
Stop using the cream immed and come in for re-eval. He may need dermatology referral or biopsy of the rash since rash is NOT allergic reaction as we suspected if the top triam is making it worse.

## 2013-01-13 NOTE — Telephone Encounter (Signed)
Called patient to advise  °

## 2013-01-15 ENCOUNTER — Ambulatory Visit: Payer: BC Managed Care – PPO | Admitting: Family Medicine

## 2013-01-15 VITALS — BP 138/82 | HR 104 | Temp 98.4°F | Resp 16 | Ht 66.5 in | Wt 179.0 lb

## 2013-01-15 DIAGNOSIS — J449 Chronic obstructive pulmonary disease, unspecified: Secondary | ICD-10-CM

## 2013-01-15 DIAGNOSIS — R21 Rash and other nonspecific skin eruption: Secondary | ICD-10-CM

## 2013-01-15 LAB — POCT SKIN KOH: Skin KOH, POC: NEGATIVE

## 2013-01-15 MED ORDER — PREDNISONE 10 MG PO TABS: ORAL_TABLET | ORAL | Status: DC

## 2013-01-15 MED ORDER — TIOTROPIUM BROMIDE MONOHYDRATE 18 MCG IN CAPS: 18.0000 ug | ORAL_CAPSULE | Freq: Every day | RESPIRATORY_TRACT | Status: DC

## 2013-01-15 MED ORDER — FLUTICASONE PROPIONATE 50 MCG/ACT NA SUSP: 2.0000 | Freq: Every day | NASAL | Status: DC

## 2013-01-15 MED ORDER — CEPHALEXIN 500 MG PO CAPS: 500.0000 mg | ORAL_CAPSULE | Freq: Four times a day (QID) | ORAL | Status: DC

## 2013-01-15 MED ORDER — ALBUTEROL SULFATE HFA 108 (90 BASE) MCG/ACT IN AERS: 2.0000 | INHALATION_SPRAY | Freq: Four times a day (QID) | RESPIRATORY_TRACT | Status: DC | PRN

## 2013-01-15 NOTE — Progress Notes (Signed)
Procedure:  Consent obtained.  Local anesthesia with 1% lido with epi.  2mm punch biopsy.  Drsg placed.

## 2013-01-15 NOTE — Progress Notes (Addendum)
Subjective:    Patient ID: Elijah Marshall, male    DOB: Jan 08, 1951, 62 y.o.   MRN: [id number] This chart was scribed for Norberto Sorenson, MD by Clydene Laming, ED Scribe. This patient was seen in room 2 and the patient's care was started at 6:45 PM. HPI HPI Comments: Elijah Marshall is a 62 y.o. male who presents to the [facility] following up for a rash that has sig worsened since last visit. I saw pt one month prev when he had an erythematous plaque surrounding his umbilicus right greater than left with central scaling and neg KOH. Suspected it was a dermatitis due to bleach in his shirts and was placed on tid kenalog and advised to removed irritant. Initially pt thought it might be improving but last week called in stating that this spreading and developing whelps as well as increased pain. Pt states he has been using his prescribed kenalog 3x a day with no improvement. Pt states he had been using Olay soap but his wife had recently switched - poss to [employer] or [employer] - he can't remember. He has also been using Vaseline hypoallergenic intensive care for his dry skin.  He just stopped using the tid Kenalog last wk when he was instructed over the phone call when he informed us that his rash sig worsened.  In the past 3d he has been trying some topical witch hazel.  The scaling around his belly button has spread and worsened and is mildly itchy.  However, the red raised area has moved from around his belly button to his left upper quadrant of his abdomen and has become much more painful over the past 3 wks. Very hyperesthetic - tender to light touch or to shirt brushing it. Has been taking zyrtec daily.  Patient Active Problem List   Diagnosis Date Noted  . Sinusitis, chronic 10/30/2012  . COPD (chronic obstructive pulmonary disease) 09/26/2011  . Nicotine addiction 09/26/2011   Past Medical History  Diagnosis Date  . Allergy   . Ulcer   . COPD (chronic obstructive pulmonary disease)       per patient's health survey - he put a ?  . GERD (gastroesophageal reflux disease)   . Arthritis   . Shortness of breath    Past Surgical History  Procedure Laterality Date  . Spine surgery  2012  . Colonoscopy  14    polyps rem  . Joint replacement Right 2010  . Anterior fusion lumbar spine  07/25/2012  . Anterior lat lumbar fusion Left 07/25/2012    Procedure: ANTERIOR LATERAL LUMBAR FUSION 1 LEVEL;  Surgeon: Emilee Hero, MD;  Location: [facility] OR;  Service: Orthopedics;  Laterality: Left;  Left sided lumbar 3-4 lateral interbody fusion with allograft and instrumentation   Allergies  Allergen Reactions  . Aspirin Nausea And Vomiting  . Morphine And Related Itching   Prior to Admission medications   Medication Sig Start Date End Date Taking? Authorizing Provider  albuterol (PROVENTIL HFA;VENTOLIN HFA) 108 (90 BASE) MCG/ACT inhaler Inhale 2 puffs into the lungs every 6 (six) hours as needed for wheezing. 02/16/12 02/15/13 Yes Sherren Mocha, MD  cetirizine (ZYRTEC) 10 MG tablet Take 1 tablet (10 mg total) by mouth daily. 03/01/12  Yes Sherren Mocha, MD  dexlansoprazole (DEXILANT) 60 MG capsule Take 1 capsule (60 mg total) by mouth daily. 12/18/12  Yes Sherren Mocha, MD  diazepam (VALIUM) 5 MG tablet Take 5 mg by mouth every  6 (six) hours as needed for anxiety.   Yes Historical Provider, MD  fluticasone (FLONASE) 50 MCG/ACT nasal spray Place 2 sprays into the nose daily. 05/15/12  Yes Sherren Mocha, MD  HYDROcodone-acetaminophen (NORCO) 10-325 MG per tablet Take 1 tablet by mouth every 6 (six) hours as needed for pain.   Yes Historical Provider, MD  polyethylene glycol powder (GLYCOLAX/MIRALAX) powder Take 17 g by mouth 2 (two) times daily as needed. 09/16/12  Yes Sherren Mocha, MD  Simethicone 125 MG TABS Take 0.96 tablets (120 mg total) by mouth 4 (four) times daily. 09/16/12  Yes Sherren Mocha, MD  tiotropium (SPIRIVA HANDIHALER) 18 MCG inhalation capsule Place 1 capsule (18 mcg total) into inhaler and  inhale daily. 03/20/12  Yes Sherren Mocha, MD  budesonide-formoterol [facility]) 160-4.5 MCG/ACT inhaler Inhale 2 puffs into the lungs 2 (two) times daily. 07/28/12   Vassie Loll, MD  triamcinolone cream (KENALOG) 0.1 % Apply 1 application topically 3 (three) times daily. 12/18/12   Sherren Mocha, MD   History   Social History  . Marital Status: Married    Spouse Name: N/A    Number of Children: N/A  . Years of Education: N/A   Occupational History  . truck driver    Social History Main Topics  . Smoking status: Former Smoker -- 0.50 packs/day for 20 years    Types: Cigarettes    Quit date: 09/24/2011  . Smokeless tobacco: Never Used     Comment: occ beer  . Alcohol Use: Yes     Comment: 2 beers a week  . Drug Use: No  . Sexual Activity: Yes    Birth Control/ Protection: None     Comment: number of sex partners in the last 12 months 1   Other Topics Concern  . Not on file   Social History Narrative  . No narrative on file      Review of Systems  Constitutional: Negative for fever, chills, activity change and appetite change.  HENT: Positive for congestion, postnasal drip and rhinorrhea. Negative for nosebleeds and sore throat.   Respiratory: Positive for wheezing. Negative for cough and shortness of breath.   Cardiovascular: Negative for leg swelling.  Gastrointestinal: Negative for nausea, vomiting, abdominal pain, diarrhea and constipation.  Musculoskeletal: Positive for myalgias. Negative for gait problem and joint swelling.  Skin: Positive for color change and rash. Negative for wound.  Neurological: Negative for weakness and numbness.  Hematological: Negative for adenopathy. Does not bruise/bleed easily.      BP 138/82  Pulse 104  Temp(Src) 98.4 F (36.9 C)  Resp 16  Ht 5' 6.5" (1.689 m)  Wt 179 lb (81.194 kg)  BMI 28.46 kg/m2  SpO2 96% Objective:   Physical Exam  Nursing note and vitals reviewed. Constitutional: He is oriented to person, place, and time.  He appears well-developed and well-nourished. No distress.  HENT:  Head: Normocephalic and atraumatic.  Right Ear: External ear and ear canal normal. Tympanic membrane is injected and retracted.  Left Ear: External ear and ear canal normal. Tympanic membrane is injected and retracted.  Nose: Mucosal edema present. No rhinorrhea.  Mouth/Throat: Uvula is midline, oropharynx is clear and moist and mucous membranes are normal. No oropharyngeal exudate.  Eyes: Conjunctivae and EOM are normal. No scleral icterus.  Neck: Normal range of motion. Neck supple. No thyromegaly present.  Cardiovascular: Normal rate, regular rhythm, normal heart sounds and intact distal pulses.   Pulmonary/Chest: Effort normal and breath  sounds normal. No respiratory distress.  Abdominal: Soft. He exhibits no distension.  Musculoskeletal: Normal range of motion. He exhibits no edema.  Lymphadenopathy:    He has no cervical adenopathy.  Neurological: He is alert and oriented to person, place, and time.  Skin: Skin is warm and dry. Rash noted. He is not diaphoretic. There is erythema.  Mild hyperpigmentation macule around umbilicus approximate 17cm by 10cm diameter with severe scaling. Similar patch on center of low back. On LUQ of abdomen there is large erythematous raised plaque blanching in serpiginous nature approx 10 cm dm.  Psychiatric: He has a normal mood and affect. His behavior is normal. Judgment normal.      Results for orders placed in visit on 01/15/13  POCT SKIN KOH      Result Value Range   Skin KOH, POC Negative     Assessment & Plan:  Rash - Plan: POCT Skin KOH, Ambulatory referral to Dermatology, Dermatology pathology - unknown etiology, skin very dry so advised cessation of top witch hazel. Ok to cont using dove soap and Vaseline intensive care moisturizer. Worsened on triamcinolone. Punch biopsy done today, in meantime, will do trial of oral prednisone taper and keflex. Pt instructed to call w/  update in 1 wk.   COPD (chronic obstructive pulmonary disease) - Plan: tiotropium (SPIRIVA HANDIHALER) 18 MCG inhalation capsule, albuterol (PROVENTIL HFA;VENTOLIN HFA) 108 (90 BASE) MCG/ACT inhaler - meds refilled   Meds ordered this encounter  Medications  . tiotropium (SPIRIVA HANDIHALER) 18 MCG inhalation capsule    Sig: Place 1 capsule (18 mcg total) into inhaler and inhale daily.    Dispense:  90 capsule    Refill:  4  . albuterol (PROVENTIL HFA;VENTOLIN HFA) 108 (90 BASE) MCG/ACT inhaler    Sig: Inhale 2 puffs into the lungs every 6 (six) hours as needed for wheezing.    Dispense:  1 Inhaler    Refill:  11  . fluticasone (FLONASE) 50 MCG/ACT nasal spray    Sig: Place 2 sprays into both nostrils at bedtime.    Dispense:  16 g    Refill:  11  . cephALEXin (KEFLEX) 500 MG capsule    Sig: Take 1 capsule (500 mg total) by mouth 4 (four) times daily.    Dispense:  40 capsule    Refill:  0  . predniSONE (DELTASONE) 10 MG tablet    Sig: 6 tabs po qd d1, 5 tabs d2, 4 tabs d3, 3 tabs d4, 2 tabs d5, 1 tab d6    Dispense:  21 tablet    Refill:  0    I personally performed the services described in this documentation, which was scribed in my presence. The recorded information has been reviewed and considered, and addended by me as needed.  Norberto Sorenson, MD MPH

## 2013-01-27 ENCOUNTER — Other Ambulatory Visit: Payer: Self-pay | Admitting: Family Medicine

## 2013-01-27 DIAGNOSIS — L509 Urticaria, unspecified: Secondary | ICD-10-CM

## 2013-01-27 MED ORDER — RANITIDINE HCL 150 MG PO TABS: 150.0000 mg | ORAL_TABLET | Freq: Two times a day (BID) | ORAL | Status: DC

## 2013-01-28 ENCOUNTER — Encounter: Payer: Self-pay | Admitting: *Deleted

## 2013-02-16 ENCOUNTER — Ambulatory Visit (INDEPENDENT_AMBULATORY_CARE_PROVIDER_SITE_OTHER): Payer: BC Managed Care – PPO | Admitting: Family Medicine

## 2013-02-16 ENCOUNTER — Telehealth: Payer: Self-pay

## 2013-02-16 VITALS — BP 142/100 | HR 90 | Temp 99.1°F | Resp 16 | Ht 68.5 in | Wt 178.0 lb

## 2013-02-16 DIAGNOSIS — J329 Chronic sinusitis, unspecified: Secondary | ICD-10-CM

## 2013-02-16 MED ORDER — IPRATROPIUM BROMIDE 0.03 % NA SOLN: 2.0000 | Freq: Four times a day (QID) | NASAL | Status: DC

## 2013-02-16 MED ORDER — AMOXICILLIN-POT CLAVULANATE 875-125 MG PO TABS
1.0000 | ORAL_TABLET | Freq: Two times a day (BID) | ORAL | Status: DC
Start: 2013-02-16 — End: 2013-07-02

## 2013-02-16 MED ORDER — METHYLPREDNISOLONE ACETATE 80 MG/ML IJ SUSP
120.0000 mg | Freq: Once | INTRAMUSCULAR | Status: AC
  Administered 2013-02-16: 120 mg via INTRAMUSCULAR

## 2013-02-16 NOTE — Patient Instructions (Signed)
Hot showers or breathing in steam may help loosen the congestion.  Using a netti pot or sinus rinse is also likely to help you feel better and keep this from progressing.  Use the atrovent nasal spray as needed throughout the day and use the fluticasone nasal spray every night before bed for at least 2 weeks.  I recommend augmenting with generic mucinex to help you move out the congestion.  If no improvement or you are getting worse, come back as you might need a course of steroids but hopefully with all of the above, you can avoid it.  Sinusitis Sinusitis is redness, soreness, and swelling (inflammation) of the paranasal sinuses. Paranasal sinuses are air pockets within the bones of your face (beneath the eyes, the middle of the forehead, or above the eyes). In healthy paranasal sinuses, mucus is able to drain out, and air is able to circulate through them by way of your nose. However, when your paranasal sinuses are inflamed, mucus and air can become trapped. This can allow bacteria and other germs to grow and cause infection. Sinusitis can develop quickly and last only a short time (acute) or continue over a long period (chronic). Sinusitis that lasts for more than 12 weeks is considered chronic.  CAUSES  Causes of sinusitis include:  Allergies.  Structural abnormalities, such as displacement of the cartilage that separates your nostrils (deviated septum), which can decrease the air flow through your nose and sinuses and affect sinus drainage.  Functional abnormalities, such as when the small hairs (cilia) that line your sinuses and help remove mucus do not work properly or are not present. SYMPTOMS  Symptoms of acute and chronic sinusitis are the same. The primary symptoms are pain and pressure around the affected sinuses. Other symptoms include:  Upper toothache.  Earache.  Headache.  Bad breath.  Decreased sense of smell and taste.  A cough, which worsens when you are lying  flat.  Fatigue.  Fever.  Thick drainage from your nose, which often is green and may contain pus (purulent).  Swelling and warmth over the affected sinuses. DIAGNOSIS  Your caregiver will perform a physical exam. During the exam, your caregiver may:  Look in your nose for signs of abnormal growths in your nostrils (nasal polyps).  Tap over the affected sinus to check for signs of infection.  View the inside of your sinuses (endoscopy) with a special imaging device with a light attached (endoscope), which is inserted into your sinuses. If your caregiver suspects that you have chronic sinusitis, one or more of the following tests may be recommended:  Allergy tests.  Nasal culture A sample of mucus is taken from your nose and sent to a lab and screened for bacteria.  Nasal cytology A sample of mucus is taken from your nose and examined by your caregiver to determine if your sinusitis is related to an allergy. TREATMENT  Most cases of acute sinusitis are related to a viral infection and will resolve on their own within 10 days. Sometimes medicines are prescribed to help relieve symptoms (pain medicine, decongestants, nasal steroid sprays, or saline sprays).  However, for sinusitis related to a bacterial infection, your caregiver will prescribe antibiotic medicines. These are medicines that will help kill the bacteria causing the infection.  Rarely, sinusitis is caused by a fungal infection. In theses cases, your caregiver will prescribe antifungal medicine. For some cases of chronic sinusitis, surgery is needed. Generally, these are cases in which sinusitis recurs more than 3  times per year, despite other treatments. HOME CARE INSTRUCTIONS   Drink plenty of water. Water helps thin the mucus so your sinuses can drain more easily.  Use a humidifier.  Inhale steam 3 to 4 times a day (for example, sit in the bathroom with the shower running).  Apply a warm, moist washcloth to your face 3  to 4 times a day, or as directed by your caregiver.  Use saline nasal sprays to help moisten and clean your sinuses.  Take over-the-counter or prescription medicines for pain, discomfort, or fever only as directed by your caregiver. SEEK IMMEDIATE MEDICAL CARE IF:  You have increasing pain or severe headaches.  You have nausea, vomiting, or drowsiness.  You have swelling around your face.  You have vision problems.  You have a stiff neck.  You have difficulty breathing. MAKE SURE YOU:   Understand these instructions.  Will watch your condition.  Will get help right away if you are not doing well or get worse. Document Released: 01/23/2005 Document Revised: 04/17/2011 Document Reviewed: 02/07/2011 [facility] Patient Information 2014 [place], [place].

## 2013-02-16 NOTE — Telephone Encounter (Signed)
Pt seen today in clinic and treated.

## 2013-02-16 NOTE — Telephone Encounter (Signed)
Patient request call back from Dr Brigitte Pulse in regards to issues he is having. [phone]

## 2013-02-16 NOTE — Telephone Encounter (Signed)
Spoke with pt, he states he has had this problem with his sinuses and his sinuses are hurting real bad. He wants to know if her could get a refill on Cephalexin. He has been trying Flonase but it is still getting worse and not better. Please advise.

## 2013-02-16 NOTE — Progress Notes (Signed)
Subjective:    Patient ID: Elijah Marshall, male    DOB: 11-21-1950, 63 y.o.   MRN: [id number]  HPI Chief Complaint  Patient presents with  . Sinusitis    x 3 day   This chart was scribed for [name], MD by Thea Alken, ED Scribe. This patient was seen in room 4 and the patient's care was started at 1:22 PM.  HPI Comments: Elijah Marshall is a 63 y.o. male who presents to the [facility] complaining of worsening sinus congestion onset 3 days ago. He reports associated HA, eye drainage, and jaw pain. Pt also reports that he has yellow, clear drainage from the nasal cavity. He states that his face is sore to touch. Pt has been using Flonase and spiriva. He reports taking pain medication prior to being seen due to severity of sinus pressure.  He denies fever, chills, cough, and ear pain.  Pt was seen 1 month ago for a rash on his stomach. He reports the rash has gotten better and he has not had problem with it in 3-4 weeks. No longer itchy.  He has a derm appt next wk.    Past Medical History  Diagnosis Date  . Allergy   . Ulcer   . COPD (chronic obstructive pulmonary disease)     per patient's health survey - he put a ?  . GERD (gastroesophageal reflux disease)   . Arthritis   . Shortness of breath    Allergies  Allergen Reactions  . Aspirin Nausea And Vomiting  . Morphine And Related Itching   Prior to Admission medications   Medication Sig Start Date End Date Taking? Authorizing Provider  albuterol (PROVENTIL HFA;VENTOLIN HFA) 108 (90 BASE) MCG/ACT inhaler Inhale 2 puffs into the lungs every 6 (six) hours as needed for wheezing. 01/15/13 01/15/14 Yes [name], MD  budesonide-formoterol [facility]) 160-4.5 MCG/ACT inhaler Inhale 2 puffs into the lungs 2 (two) times daily. 07/28/12  Yes Barton Dubois, MD  cetirizine (ZYRTEC) 10 MG tablet Take 1 tablet (10 mg total) by mouth daily. 03/01/12  Yes [name], MD  dexlansoprazole (DEXILANT) 60 MG capsule Take  1 capsule (60 mg total) by mouth daily. 12/18/12  Yes [name], MD  diazepam (VALIUM) 5 MG tablet Take 5 mg by mouth every 6 (six) hours as needed for anxiety.   Yes Historical Provider, MD  fluticasone (FLONASE) 50 MCG/ACT nasal spray Place 2 sprays into both nostrils at bedtime. 01/15/13  Yes [name], MD  HYDROcodone-acetaminophen (NORCO) 10-325 MG per tablet Take 1 tablet by mouth every 6 (six) hours as needed for pain.   Yes Historical Provider, MD  polyethylene glycol powder (GLYCOLAX/MIRALAX) powder Take 17 g by mouth 2 (two) times daily as needed. 09/16/12  Yes [name], MD  ranitidine (ZANTAC) 150 MG tablet Take 1 tablet (150 mg total) by mouth 2 (two) times daily. 01/27/13  Yes [name], MD  Simethicone 125 MG TABS Take 0.96 tablets (120 mg total) by mouth 4 (four) times daily. 09/16/12  Yes [name], MD  tiotropium (SPIRIVA HANDIHALER) 18 MCG inhalation capsule Place 1 capsule (18 mcg total) into inhaler and inhale daily. 01/15/13  Yes [name], MD    Review of Systems  Constitutional: Negative for fever and chills.  HENT: Positive for congestion and sinus pressure. Negative for ear pain.        Jaw pain  Eyes: Positive for  discharge.  Respiratory: Negative for cough.   Gastrointestinal: Negative for nausea and vomiting.  Neurological: Positive for headaches.    BP 142/100  Pulse 90  Temp(Src) 99.1 F (37.3 C) (Oral)  Resp 16  Ht 5' 8.5" (1.74 m)  Wt 178 lb (80.74 kg)  BMI 26.67 kg/m2  SpO2 95% Objective:   Physical Exam  Nursing note and vitals reviewed. Constitutional: He is oriented to person, place, and time. He appears well-developed and well-nourished. No distress.  HENT:  Head: Normocephalic and atraumatic.  Right Ear: External ear and ear canal normal. Tympanic membrane is retracted. A middle ear effusion is present.  Left Ear: External ear and ear canal normal. Tympanic membrane is retracted. A middle ear effusion is present.  Nose: Mucosal edema and  rhinorrhea present. Right sinus exhibits maxillary sinus tenderness. Left sinus exhibits maxillary sinus tenderness.  Mouth/Throat: Uvula is midline and mucous membranes are normal. Posterior oropharyngeal erythema present. No oropharyngeal exudate or posterior oropharyngeal edema.  Eyes: Conjunctivae and EOM are normal. Right eye exhibits no discharge. Left eye exhibits no discharge. No scleral icterus.  Neck: Normal range of motion. Neck supple. No tracheal deviation present. No mass and no thyromegaly present.  Cardiovascular: Normal rate, regular rhythm, normal heart sounds and intact distal pulses.   Pulmonary/Chest: Effort normal. No respiratory distress. He has wheezes ( mild inspiratory wheeze throughout  ).  Musculoskeletal: Normal range of motion.  Lymphadenopathy:       Head (right side): No submandibular adenopathy present.       Head (left side): No submandibular adenopathy present.    He has no cervical adenopathy.       Right cervical: No superficial cervical adenopathy present.      Left cervical: No superficial cervical adenopathy present.       Right: No supraclavicular adenopathy present.       Left: No supraclavicular adenopathy present.  Neurological: He is alert and oriented to person, place, and time.  Skin: Skin is warm and dry. He is not diaphoretic. No erythema.  Psychiatric: He has a normal mood and affect. His behavior is normal.      Assessment & Plan:  Sinusitis, chronic - Plan: methylPREDNISolone acetate (DEPO-MEDROL) injection 120 mg Gave pt S[facility] rx for augmentin - sxs may resolve w/ steroid trx alone since sxs began recently but can fill Augmentin if sxs cont, worsen, or recur. Meds ordered this encounter  Medications  . methylPREDNISolone acetate (DEPO-MEDROL) injection 120 mg    Sig:   . amoxicillin-clavulanate (AUGMENTIN) 875-125 MG per tablet    Sig: Take 1 tablet by mouth 2 (two) times daily.    Dispense:  20 tablet    Refill:  0  . ipratropium  (ATROVENT) 0.03 % nasal spray    Sig: Place 2 sprays into the nose 4 (four) times daily.    Dispense:  30 mL    Refill:  1    I personally performed the services described in this documentation, which was scribed in my presence. The recorded information has been reviewed and considered, and addended by me as needed.  [name], MD MPH

## 2013-03-18 ENCOUNTER — Other Ambulatory Visit: Payer: Self-pay | Admitting: Family Medicine

## 2013-04-07 ENCOUNTER — Ambulatory Visit: Payer: BC Managed Care – PPO | Admitting: Family Medicine

## 2013-04-07 ENCOUNTER — Other Ambulatory Visit: Payer: Self-pay | Admitting: Family Medicine

## 2013-04-07 VITALS — BP 145/92 | HR 94 | Temp 98.3°F | Resp 18 | Ht 67.0 in | Wt 178.4 lb

## 2013-04-07 DIAGNOSIS — J309 Allergic rhinitis, unspecified: Secondary | ICD-10-CM

## 2013-04-07 DIAGNOSIS — J329 Chronic sinusitis, unspecified: Secondary | ICD-10-CM

## 2013-04-07 DIAGNOSIS — K219 Gastro-esophageal reflux disease without esophagitis: Secondary | ICD-10-CM

## 2013-04-07 DIAGNOSIS — J069 Acute upper respiratory infection, unspecified: Secondary | ICD-10-CM

## 2013-04-07 DIAGNOSIS — J441 Chronic obstructive pulmonary disease with (acute) exacerbation: Secondary | ICD-10-CM

## 2013-04-07 DIAGNOSIS — R0789 Other chest pain: Secondary | ICD-10-CM

## 2013-04-07 LAB — POCT CBC
Granulocyte percent: 58.6 %G (ref 37–80)
HCT, POC: 46.4 % (ref 43.5–53.7)
Hemoglobin: 14.3 g/dL (ref 14.1–18.1)
Lymph, poc: 1.8 (ref 0.6–3.4)
MCH, POC: 29.9 pg (ref 27–31.2)
MCHC: 30.8 g/dL — AB (ref 31.8–35.4)
MCV: 96.9 fL (ref 80–97)
MID (cbc): 0.4 (ref 0–0.9)
MPV: 7.8 fL (ref 0–99.8)
POC Granulocyte: 3.1 (ref 2–6.9)
POC LYMPH PERCENT: 33.3 %L (ref 10–50)
POC MID %: 8.1 %M (ref 0–12)
Platelet Count, POC: 223 10*3/uL (ref 142–424)
RBC: 4.79 M/uL (ref 4.69–6.13)
RDW, POC: 14.1 %
WBC: 5.3 10*3/uL (ref 4.6–10.2)

## 2013-04-07 MED ORDER — CEFDINIR 300 MG PO CAPS: 600.0000 mg | ORAL_CAPSULE | Freq: Every day | ORAL | Status: DC

## 2013-04-07 MED ORDER — METHYLPREDNISOLONE ACETATE 40 MG/ML IJ SUSP
80.0000 mg | Freq: Once | INTRAMUSCULAR | Status: AC
  Administered 2013-04-07: 80 mg via INTRAMUSCULAR

## 2013-04-07 NOTE — Patient Instructions (Signed)
Take the [facility] one twice daily for antibiotic.  Referral is being made to a gastroenterologist  Take the ranitidine one daily at bedtime  If chest burning is not improving come back or go to the emergency room if abruptly worse

## 2013-04-07 NOTE — Progress Notes (Signed)
Subjective: Patient has been having several days of bad rhinorrhea and some coughing. He feels a little sore his throat. He has not had headaches are much body aches. He does have a burning substernal discomfort. He has a history of bad GERD. He's been hurting in his chest over the last couple of weeks. He takes Building surveyor and         . He has not seen a gastroenterologist for a long time. He's not getting chest pain with exertion, walks.     Objective: TMs normal. Throat clear. Nose congested. Neck supple without nodes. Chest clear. Heart regular without murmurs gallops or arrhythmias. Abdomen soft and nontender.  EKG normal   Results for orders placed in visit on 04/07/13  POCT CBC      Result Value Ref Range   WBC 5.3  4.6 - 10.2 K/uL   Lymph, poc 1.8  0.6 - 3.4   POC LYMPH PERCENT 33.3  10 - 50 %L   MID (cbc) 0.4  0 - 0.9   POC MID % 8.1  0 - 12 %M   POC Granulocyte 3.1  2 - 6.9   Granulocyte percent 58.6  37 - 80 %G   RBC 4.79  4.69 - 6.13 M/uL   Hemoglobin 14.3  14.1 - 18.1 g/dL   HCT, POC 46.4  43.5 - 53.7 %   MCV 96.9  80 - 97 fL   MCH, POC 29.9  27 - 31.2 pg   MCHC 30.8 (*) 31.8 - 35.4 g/dL   RDW, POC 14.1     Platelet Count, POC 223  142 - 424 K/uL   MPV 7.8  0 - 99.8 fL   EKG normal  Assessment: Possible sinusitis Rhinitis COPD GERD  Plan: Try taking a little ranitidine also. Referred to gastroenterology for an EGD.

## 2013-04-07 NOTE — Progress Notes (Signed)
   Subjective:    Patient ID: Elijah Marshall, male    DOB: Aug 28, 1950, 63 y.o.   MRN: [id number]  HPI    Review of Systems     Objective:   Physical Exam        Assessment & Plan:

## 2013-04-18 ENCOUNTER — Ambulatory Visit: Payer: BC Managed Care – PPO | Admitting: Family Medicine

## 2013-04-18 VITALS — BP 138/86 | HR 100 | Temp 98.6°F | Resp 18 | Ht 68.5 in | Wt 176.0 lb

## 2013-04-18 DIAGNOSIS — R05 Cough: Secondary | ICD-10-CM

## 2013-04-18 DIAGNOSIS — J209 Acute bronchitis, unspecified: Secondary | ICD-10-CM

## 2013-04-18 DIAGNOSIS — R059 Cough, unspecified: Secondary | ICD-10-CM

## 2013-04-18 DIAGNOSIS — R109 Unspecified abdominal pain: Secondary | ICD-10-CM

## 2013-04-18 MED ORDER — AZITHROMYCIN 250 MG PO TABS: ORAL_TABLET | ORAL | Status: DC

## 2013-04-18 MED ORDER — BENZONATATE 100 MG PO CAPS: 100.0000 mg | ORAL_CAPSULE | Freq: Three times a day (TID) | ORAL | Status: DC | PRN

## 2013-04-18 NOTE — Progress Notes (Signed)
Subjective: Patient has not yet quite completed the 10 day course of Omnicef I gave him 11 days ago and he continues to cough a lot. His abdominal wall hurts from coughing. He wants to make sure he did not have pneumonia. He has quit smoking. He has been seeing his back doctor who has just given him a tapered dose of prednisone which is almost finished. He also gave him some hydrocodone back pain pills. He wanted to be sure he did not have a pneumonia so he came in today.  Objective: Neck supple, nontender. Chest clear to auscultation. Heart regular without murmurs gallops or arrhythmias. Nothing that would suggest pneumonia. Abdomen soft, mild abdominal wall tenderness.  Assessment: Persistent bronchitis Abdominal wall pain  Plan: Try a course of azithromycin. Tessalon The hydrocodone he has for pain will also help the cough some. I think it probably will just have to run its course. He is already had a course of prednisone so I do not think he needs to be given him off that.

## 2013-04-18 NOTE — Patient Instructions (Signed)
Finish the [place] that you have.  Beginning tomorrow morning start the Z-Pak (azithromycin) 2 pills initially, then one daily for 4 days  Take the cough pills one or 2 every 8 hours as needed for cough  Take the pain medications your back doctor gave you.  That will suppress the cough some also.  Finish the prednisone  If not better in 1 week we will need to do other testing.

## 2013-04-28 ENCOUNTER — Encounter: Payer: Self-pay | Admitting: Family Medicine

## 2013-04-29 ENCOUNTER — Other Ambulatory Visit: Payer: Self-pay | Admitting: Family Medicine

## 2013-04-30 NOTE — Telephone Encounter (Signed)
Dr Linna Darner, do you want to RF tessalon for cough or see pt back again?

## 2013-05-15 ENCOUNTER — Emergency Department (HOSPITAL_BASED_OUTPATIENT_CLINIC_OR_DEPARTMENT_OTHER)
Admission: EM | Admit: 2013-05-15 | Discharge: 2013-05-15 | Disposition: A | Discharge status: Home / Self Care | Payer: BC Managed Care – PPO | Attending: Emergency Medicine | Admitting: Emergency Medicine

## 2013-05-15 ENCOUNTER — Encounter (HOSPITAL_BASED_OUTPATIENT_CLINIC_OR_DEPARTMENT_OTHER): Payer: Self-pay | Admitting: Emergency Medicine

## 2013-05-15 ENCOUNTER — Emergency Department (HOSPITAL_BASED_OUTPATIENT_CLINIC_OR_DEPARTMENT_OTHER): Payer: BC Managed Care – PPO

## 2013-05-15 DIAGNOSIS — Z872 Personal history of diseases of the skin and subcutaneous tissue: Secondary | ICD-10-CM | POA: Insufficient documentation

## 2013-05-15 DIAGNOSIS — R0789 Other chest pain: Secondary | ICD-10-CM

## 2013-05-15 DIAGNOSIS — Z79899 Other long term (current) drug therapy: Secondary | ICD-10-CM | POA: Insufficient documentation

## 2013-05-15 DIAGNOSIS — IMO0002 Reserved for concepts with insufficient information to code with codable children: Secondary | ICD-10-CM | POA: Insufficient documentation

## 2013-05-15 DIAGNOSIS — R61 Generalized hyperhidrosis: Secondary | ICD-10-CM | POA: Insufficient documentation

## 2013-05-15 DIAGNOSIS — J441 Chronic obstructive pulmonary disease with (acute) exacerbation: Secondary | ICD-10-CM | POA: Insufficient documentation

## 2013-05-15 DIAGNOSIS — M129 Arthropathy, unspecified: Secondary | ICD-10-CM | POA: Insufficient documentation

## 2013-05-15 DIAGNOSIS — R0602 Shortness of breath: Secondary | ICD-10-CM

## 2013-05-15 DIAGNOSIS — Z87891 Personal history of nicotine dependence: Secondary | ICD-10-CM | POA: Insufficient documentation

## 2013-05-15 DIAGNOSIS — K219 Gastro-esophageal reflux disease without esophagitis: Secondary | ICD-10-CM | POA: Insufficient documentation

## 2013-05-15 DIAGNOSIS — Z792 Long term (current) use of antibiotics: Secondary | ICD-10-CM | POA: Insufficient documentation

## 2013-05-15 LAB — BASIC METABOLIC PANEL
BUN: 11 mg/dL (ref 6–23)
CO2: 28 mEq/L (ref 19–32)
Calcium: 9.7 mg/dL (ref 8.4–10.5)
Chloride: 102 mEq/L (ref 96–112)
Creatinine, Ser: 1 mg/dL (ref 0.50–1.35)
GFR calc Af Amer: >90 mL/min (ref >90)
GFR calc non Af Amer: 79 mL/min — ABNORMAL LOW (ref >90)
Glucose, Bld: 114 mg/dL — ABNORMAL HIGH (ref 70–99)
Potassium: 4.6 mEq/L (ref 3.7–5.3)
Sodium: 140 mEq/L (ref 137–147)

## 2013-05-15 LAB — TROPONIN I
Troponin I: <0.3 ng/mL (ref <0.30)
Troponin I: <0.3 ng/mL (ref <0.30)

## 2013-05-15 LAB — CBC
HCT: 46.7 % (ref 39.0–52.0)
Hemoglobin: 16.2 g/dL (ref 13.0–17.0)
MCH: 30.8 pg (ref 26.0–34.0)
MCHC: 34.7 g/dL (ref 30.0–36.0)
MCV: 88.8 fL (ref 78.0–100.0)
Platelets: 220 10*3/uL (ref 150–400)
RBC: 5.26 MIL/uL (ref 4.22–5.81)
RDW: 13.1 % (ref 11.5–15.5)
WBC: 7.2 10*3/uL (ref 4.0–10.5)

## 2013-05-15 MED ORDER — BUDESONIDE-FORMOTEROL FUMARATE 80-4.5 MCG/ACT IN AERO: 2.0000 | INHALATION_SPRAY | Freq: Every day | RESPIRATORY_TRACT | Status: DC

## 2013-05-15 MED ORDER — GI COCKTAIL ~~LOC~~
30.0000 mL | Freq: Once | ORAL | Status: AC
  Administered 2013-05-15: 30 mL via ORAL
  Filled 2013-05-15: qty 30

## 2013-05-15 NOTE — ED Provider Notes (Signed)
CSN: [id number]     Arrival date & time 05/15/13  1638 History   First MD Initiated Contact with Patient 05/15/13 1658     Chief Complaint  Patient presents with  . Shortness of Breath     (Consider location/radiation/quality/duration/timing/severity/associated sxs/prior Treatment) Patient is a 63 y.o. male presenting with shortness of breath. The history is provided by the patient.  Shortness of Breath Severity:  Moderate Onset quality:  Sudden Duration:  1 hour Timing:  Constant Progression:  Resolved Chronicity:  New Context comment:  Had just taken 2 vicodin and lay down on the couch Relieved by:  Nothing Worsened by:  Nothing tried Ineffective treatments:  None tried Associated symptoms: diaphoresis   Associated symptoms: no abdominal pain, no chest pain, no cough, no fever and no vomiting     Past Medical History  Diagnosis Date  . Allergy   . Ulcer   . COPD (chronic obstructive pulmonary disease)     per patient's health survey - he put a ?  . GERD (gastroesophageal reflux disease)   . Arthritis   . Shortness of breath    Past Surgical History  Procedure Laterality Date  . Spine surgery  2012  . Colonoscopy  14    polyps rem  . Joint replacement Right 2010  . Anterior fusion lumbar spine  07/25/2012  . Anterior lat lumbar fusion Left 07/25/2012    Procedure: ANTERIOR LATERAL LUMBAR FUSION 1 LEVEL;  Surgeon: [name], MD;  Location: [place];  Service: Orthopedics;  Laterality: Left;  Left sided lumbar 3-4 lateral interbody fusion with allograft and instrumentation   Family History  Problem Relation Age of Onset  . Cancer Mother     colon  . Heart disease Father   . Diabetes Sister   . Hypertension Sister   . Hypertension Brother   . Heart disease Maternal Grandmother     heart attack   History  Substance Use Topics  . Smoking status: Former Smoker -- 0.50 packs/day for 20 years    Types: Cigarettes    Quit date: 09/24/2011  . Smokeless  tobacco: Never Used     Comment: occ beer  . Alcohol Use: Yes     Comment: 2 beers a week    Review of Systems  Constitutional: Positive for diaphoresis. Negative for fever and chills.  Respiratory: Positive for chest tightness and shortness of breath. Negative for cough.   Cardiovascular: Negative for chest pain.  Gastrointestinal: Negative for nausea, vomiting and abdominal pain.  All other systems reviewed and are negative.     Allergies  Aspirin and Morphine and related  Home Medications   Current Outpatient Rx  Name  Route  Sig  Dispense  Refill  . albuterol (PROVENTIL HFA;VENTOLIN HFA) 108 (90 BASE) MCG/ACT inhaler   Inhalation   Inhale 2 puffs into the lungs every 6 (six) hours as needed for wheezing.   1 Inhaler   11   . amoxicillin-clavulanate (AUGMENTIN) 875-125 MG per tablet   Oral   Take 1 tablet by mouth 2 (two) times daily.   20 tablet   0   . azithromycin (ZITHROMAX) 250 MG tablet      Take 2 pills initially, then one daily for 4 days.   6 tablet   0   . benzonatate (TESSALON) 100 MG capsule      TAKE 1-2 CAPSULES (100-200 MG TOTAL) BY MOUTH 3 (THREE) TIMES DAILY AS NEEDED FOR COUGH.   40 capsule  0   . budesonide-formoterol (SYMBICORT) 160-4.5 MCG/ACT inhaler   Inhalation   Inhale 2 puffs into the lungs 2 (two) times daily.   1 Inhaler   1   . cefdinir (OMNICEF) 300 MG capsule   Oral   Take 2 capsules (600 mg total) by mouth daily.   20 capsule   0   . cetirizine (ZYRTEC) 10 MG tablet      TAKE 1 TABLET (10 MG TOTAL) BY MOUTH DAILY.   30 tablet   10   . dexlansoprazole (DEXILANT) 60 MG capsule   Oral   Take 1 capsule (60 mg total) by mouth daily.   30 capsule   5   . diazepam (VALIUM) 5 MG tablet   Oral   Take 5 mg by mouth every 6 (six) hours as needed for anxiety.         . fluticasone (FLONASE) 50 MCG/ACT nasal spray   Each Nare   Place 2 sprays into both nostrils at bedtime.   16 g   11   .  HYDROcodone-acetaminophen (NORCO) 10-325 MG per tablet   Oral   Take 1 tablet by mouth every 6 (six) hours as needed for pain.         Marland Kitchen ipratropium (ATROVENT) 0.03 % nasal spray   Nasal   Place 2 sprays into the nose 4 (four) times daily.   30 mL   1   . polyethylene glycol powder (GLYCOLAX/MIRALAX) powder   Oral   Take 17 g by mouth 2 (two) times daily as needed.   527 g   1   . ranitidine (ZANTAC) 150 MG tablet   Oral   Take 1 tablet (150 mg total) by mouth 2 (two) times daily.   60 tablet   0   . Simethicone 125 MG TABS   Oral   Take 0.96 tablets (120 mg total) by mouth 4 (four) times daily.   40 tablet   0   . tiotropium (SPIRIVA HANDIHALER) 18 MCG inhalation capsule   Inhalation   Place 1 capsule (18 mcg total) into inhaler and inhale daily.   90 capsule   4    BP 163/98  Pulse 94  Temp(Src) 98.6 F (37 C) (Oral)  Resp 16  Ht 5\' 9"  (1.753 m)  Wt 176 lb (79.833 kg)  BMI 25.98 kg/m2  SpO2 96% Physical Exam  Nursing note and vitals reviewed. Constitutional: He is oriented to person, place, and time. He appears well-developed and well-nourished. No distress.  HENT:  Head: Normocephalic and atraumatic.  Mouth/Throat: No oropharyngeal exudate.  Eyes: EOM are normal. Pupils are equal, round, and reactive to light.  Neck: Normal range of motion. Neck supple.  Cardiovascular: Normal rate and regular rhythm.  Exam reveals no friction rub.   No murmur heard. Pulmonary/Chest: Effort normal and breath sounds normal. No respiratory distress. He has no wheezes. He has no rales.  Abdominal: He exhibits no distension. There is no tenderness. There is no rebound.  Musculoskeletal: Normal range of motion. He exhibits no edema.  Neurological: He is alert and oriented to person, place, and time.  Skin: No rash noted. He is not diaphoretic.    ED Course  Procedures (including critical care time) Labs Review Labs Reviewed  CBC  BASIC METABOLIC PANEL  TROPONIN I    Imaging Review No results found.   EKG Interpretation   Date/Time:  Thursday May 15 2013 16:45:16 EDT Ventricular Rate:  85 PR Interval:  208 QRS Duration: 86 QT Interval:  378 QTC Calculation: 449 R Axis:   44 Text Interpretation:  Normal sinus rhythm Normal ECG Similar to prior  Confirmed by Mingo Amber  MD, [place] ([id number]) on 05/15/2013 4:57:44 PM      MDM   Final diagnoses:  Chest tightness  Shortness of breath    63 year old male presents with shortness of breath. Episode lasted an hour. Began after lying down on the couch. Associated chest tightness, diaphoresis. He just taken 2 Vicodin, which she takes regularly for back pain. Denies any chest pain or heaviness. No history of coronary artery disease. Symptoms alleviated just before arrival here. Vitals stable here. Exam is benign. He does have risk factors of smoking, older age, being African-American, so will do serial troponin measurements. Likely related to reflux, we will give GI cocktail. Asymptomatic at this time. Serial troponins normal. Stable for discharge.    Osvaldo Shipper, MD 05/15/13 [phone]

## 2013-05-15 NOTE — ED Notes (Signed)
Sob for an hour. He took Vicodin x 2 prior to sob. He got nauseated, fast felt numb and he became sob.

## 2013-05-15 NOTE — Discharge Instructions (Signed)

## 2013-07-01 ENCOUNTER — Telehealth: Payer: Self-pay

## 2013-07-01 NOTE — Telephone Encounter (Signed)
Dr. Brigitte Pulse   Patient has recurring sinus issues.  Would like to talk with nurse.   [phone]

## 2013-07-01 NOTE — Telephone Encounter (Signed)
Sinuses are bothering him.  Ear pain, sinus headache, runny eyes x 3days.  Has been taking zyrtec and abreva with no relief. Wants to know if there is anything else he can do. Requests Dr. Raul Del opinion. Please advise. Best number [phone]

## 2013-07-02 MED ORDER — MONTELUKAST SODIUM 10 MG PO TABS: 10.0000 mg | ORAL_TABLET | Freq: Every day | ORAL | Status: DC

## 2013-07-02 NOTE — Telephone Encounter (Signed)
Glad he is staying on his zyrtec and spiriva.  Is he still using his flonase nasal spray as well?  I will call him in some singulair - hopefully this well help. Also, he can try some Afrin which will help w/ that nasal congestion but do NOT use for >3d or will get worse nasal congestion when stopping.  Hot showers or breathing in steam may help loosen the congestion.  Using a netti pot or sinus rinse is also likely to help you feel better and keep this from progressing.  Generic mucinex might ehlp move out the congestion.  If no improvement or he is getting worse, he might need to come in for a steroid shot and to see if he needs antibiotics.

## 2013-07-03 NOTE — Telephone Encounter (Signed)
Advised pt instructions- he is going to give the new allergy medication a few days and if no improvement will RTC

## 2013-07-05 ENCOUNTER — Other Ambulatory Visit: Payer: Self-pay | Admitting: Family Medicine

## 2013-07-11 ENCOUNTER — Other Ambulatory Visit: Payer: Self-pay | Admitting: Orthopedic Surgery

## 2013-07-11 DIAGNOSIS — M25552 Pain in left hip: Secondary | ICD-10-CM

## 2013-07-15 ENCOUNTER — Ambulatory Visit
Admission: RE | Admit: 2013-07-15 | Discharge: 2013-07-15 | Disposition: A | Discharge status: Home / Self Care | Payer: BC Managed Care – PPO | Source: Ambulatory Visit | Attending: Orthopedic Surgery | Admitting: Orthopedic Surgery

## 2013-07-15 DIAGNOSIS — M25552 Pain in left hip: Secondary | ICD-10-CM

## 2013-08-15 ENCOUNTER — Encounter: Payer: Self-pay | Admitting: Family Medicine

## 2013-08-15 ENCOUNTER — Ambulatory Visit (INDEPENDENT_AMBULATORY_CARE_PROVIDER_SITE_OTHER): Payer: BC Managed Care – PPO | Admitting: Family Medicine

## 2013-08-15 VITALS — BP 131/88 | HR 91 | Temp 98.7°F | Resp 16 | Ht 67.5 in | Wt 179.0 lb

## 2013-08-15 DIAGNOSIS — Z79899 Other long term (current) drug therapy: Secondary | ICD-10-CM

## 2013-08-15 DIAGNOSIS — J301 Allergic rhinitis due to pollen: Secondary | ICD-10-CM

## 2013-08-15 DIAGNOSIS — J449 Chronic obstructive pulmonary disease, unspecified: Secondary | ICD-10-CM

## 2013-08-15 LAB — CBC WITH DIFFERENTIAL/PLATELET
Basophils Absolute: 0.1 10*3/uL (ref 0.0–0.1)
Basophils Relative: 1 % (ref 0–1)
Eosinophils Absolute: 0.2 10*3/uL (ref 0.0–0.7)
Eosinophils Relative: 3 % (ref 0–5)
HCT: 49.8 % (ref 39.0–52.0)
Hemoglobin: 17.3 g/dL — ABNORMAL HIGH (ref 13.0–17.0)
Lymphocytes Relative: 32 % (ref 12–46)
Lymphs Abs: 1.6 10*3/uL (ref 0.7–4.0)
MCH: 30.2 pg (ref 26.0–34.0)
MCHC: 34.7 g/dL (ref 30.0–36.0)
MCV: 87.1 fL (ref 78.0–100.0)
Monocytes Absolute: 0.5 10*3/uL (ref 0.1–1.0)
Monocytes Relative: 10 % (ref 3–12)
Neutro Abs: 2.7 10*3/uL (ref 1.7–7.7)
Neutrophils Relative %: 54 % (ref 43–77)
Platelets: 252 10*3/uL (ref 150–400)
RBC: 5.72 MIL/uL (ref 4.22–5.81)
RDW: 14.5 % (ref 11.5–15.5)
WBC: 5 10*3/uL (ref 4.0–10.5)

## 2013-08-15 MED ORDER — MONTELUKAST SODIUM 10 MG PO TABS: 10.0000 mg | ORAL_TABLET | Freq: Every day | ORAL | Status: DC

## 2013-08-15 MED ORDER — BUDESONIDE-FORMOTEROL FUMARATE 160-4.5 MCG/ACT IN AERO: 2.0000 | INHALATION_SPRAY | Freq: Two times a day (BID) | RESPIRATORY_TRACT | Status: DC

## 2013-08-15 MED ORDER — AZELASTINE-FLUTICASONE 137-50 MCG/ACT NA SUSP: 1.0000 | Freq: Two times a day (BID) | NASAL | Status: DC

## 2013-08-15 MED ORDER — METHYLPREDNISOLONE ACETATE 80 MG/ML IJ SUSP
120.0000 mg | Freq: Once | INTRAMUSCULAR | Status: AC
  Administered 2013-08-15: 120 mg via INTRAMUSCULAR

## 2013-08-15 NOTE — Patient Instructions (Signed)
Chronic Obstructive Pulmonary Disease Chronic obstructive pulmonary disease (COPD) is a common lung condition in which airflow from the lungs is limited. COPD is a general term that can be used to describe many different lung problems that limit airflow, including both chronic bronchitis and emphysema. If you have COPD, your lung function will probably never return to normal, but there are measures you can take to improve lung function and make yourself feel better.  CAUSES   Smoking (common).   Exposure to secondhand smoke.   Genetic problems.  Chronic inflammatory lung diseases or recurrent infections. SYMPTOMS   Shortness of breath, especially with physical activity.   Deep, persistent (chronic) cough with a large amount of thick mucus.   Wheezing.   Rapid breaths (tachypnea).   Gray or bluish discoloration (cyanosis) of the skin, especially in fingers, toes, or lips.   Fatigue.   Weight loss.   Frequent infections or episodes when breathing symptoms become much worse (exacerbations).   Chest tightness. DIAGNOSIS  Your healthcare provider will take a medical history and perform a physical examination to make the initial diagnosis. Additional tests for COPD may include:   Lung (pulmonary) function tests.  Chest X-ray.  CT scan.  Blood tests. TREATMENT  Treatment available to help you feel better when you have COPD include:   Inhaler and nebulizer medicines. These help manage the symptoms of COPD and make your breathing more comfortable  Supplemental oxygen. Supplemental oxygen is only helpful if you have a low oxygen level in your blood.   Exercise and physical activity. These are beneficial for nearly all people with COPD. Some people may also benefit from a pulmonary rehabilitation program. HOME CARE INSTRUCTIONS   Take all medicines (inhaled or pills) as directed by your health care provider.  Only take over-the-counter or prescription medicines  for pain, fever, or discomfort as directed by your health care provider.   Avoid over-the-counter medicines or cough syrups that dry up your airway (such as antihistamines) and slow down the elimination of secretions unless instructed otherwise by your healthcare provider.   If you are a smoker, the most important thing that you can do is stop smoking. Continuing to smoke will cause further lung damage and breathing trouble. Ask your health care provider for help with quitting smoking. He or she can direct you to community resources or hospitals that provide support.  Avoid exposure to irritants such as smoke, chemicals, and fumes that aggravate your breathing.  Use oxygen therapy and pulmonary rehabilitation if directed by your health care provider. If you require home oxygen therapy, ask your healthcare provider whether you should purchase a pulse oximeter to measure your oxygen level at home.   Avoid contact with individuals who have a contagious illness.  Avoid extreme temperature and humidity changes.  Eat healthy foods. Eating smaller, more frequent meals and resting before meals may help you maintain your strength.  Stay active, but balance activity with periods of rest. Exercise and physical activity will help you maintain your ability to do things you want to do.  Preventing infection and hospitalization is very important when you have COPD. Make sure to receive all the vaccines your health care provider recommends, especially the pneumococcal and influenza vaccines. Ask your healthcare provider whether you need a pneumonia vaccine.  Learn and use relaxation techniques to manage stress.  Learn and use controlled breathing techniques as directed by your health care provider. Controlled breathing techniques include:   Pursed lip breathing. Start by breathing   in (inhaling) through your nose for 1 second. Then, purse your lips as if you were going to whistle and breathe out (exhale)  through the pursed lips for 2 seconds.   Diaphragmatic breathing. Start by putting one hand on your abdomen just above your waist. Inhale slowly through your nose. The hand on your abdomen should move out. Then purse your lips and exhale slowly. You should be able to feel the hand on your abdomen moving in as you exhale.   Learn and use controlled coughing to clear mucus from your lungs. Controlled coughing is a series of short, progressive coughs. The steps of controlled coughing are:  1. Lean your head slightly forward.  2. Breathe in deeply using diaphragmatic breathing.  3. Try to hold your breath for 3 seconds.  4. Keep your mouth slightly open while coughing twice.  5. Spit any mucus out into a tissue.  6. Rest and repeat the steps once or twice as needed. SEEK MEDICAL CARE IF:   You are coughing up more mucus than usual.   There is a change in the color or thickness of your mucus.   Your breathing is more labored than usual.   Your breathing is faster than usual.  SEEK IMMEDIATE MEDICAL CARE IF:   You have shortness of breath while you are resting.   You have shortness of breath that prevents you from:  Being able to talk.   Performing your usual physical activities.   You have chest pain lasting longer than 5 minutes.   Your skin color is more cyanotic than usual.  You measure low oxygen saturations for longer than 5 minutes with a pulse oximeter. MAKE SURE YOU:   Understand these instructions.  Will watch your condition.  Will get help right away if you are not doing well or get worse. Document Released: 11/02/2004 Document Revised: 11/13/2012 Document Reviewed: 09/19/2012 Exit[employer] Patient Information 2015 [employer]. This information is not intended to replace advice given to you by your health care provider. Make sure you discuss any questions you have with your health care provider.  

## 2013-08-15 NOTE — Progress Notes (Signed)
Subjective:  This chart was scribed for [name], MD by [name], Scribe.  This patient was seen in [facility] and the patient's care was started at 11:46 AM.   Patient ID: Elijah Marshall, male    DOB: 08-23-1950, 63 y.o.   MRN: [id number]  Chief Complaint  Patient presents with  . Nasal Congestion  . Shortness of Breath    HPI  HPI Comments: Elijah Marshall is a 63 y.o. male with COPD who presents to [facility] complaining of one week of recurrent nasal congestion and drainage.  He states he has been occasionally coughing up sputum.  He also reports intermittent SOB, particularly when he is performing activities.   He had started on Singulair daily which he notices does help his breathing improve overnight.  He is saving his Symbicort inhaler for only when his respiratory symptoms get very bad, and using the Proventil first and the Symbicort only if symptoms continue to worsen.  He continues to have episodic cough and chest congestion.  He is unfortunately developing some anxiety surrounding his COPD.  He was seen in the ER for chest pain in April.  Cardiac workup was negative and it was attributed to anxiety.  He quit smoking 1 1/2 years ago after being diagnosed with COPD.  He has never seen a pulmonologist but would like to.   Past Medical History  Diagnosis Date  . Allergy   . Ulcer   . COPD (chronic obstructive pulmonary disease)     per patient's health survey - he put a ?  . GERD (gastroesophageal reflux disease)   . Arthritis   . Shortness of breath      Current Outpatient Prescriptions on File Prior to Visit  Medication Sig Dispense Refill  . albuterol (PROVENTIL HFA;VENTOLIN HFA) 108 (90 BASE) MCG/ACT inhaler Inhale 2 puffs into the lungs every 6 (six) hours as needed for wheezing.  1 Inhaler  11  . budesonide-formoterol (SYMBICORT) 160-4.5 MCG/ACT inhaler Inhale 2 puffs into the lungs 2 (two) times daily.  1 Inhaler  1  . budesonide-formoterol (SYMBICORT)  80-4.5 MCG/ACT inhaler Inhale 2 puffs into the lungs daily.  1 Inhaler  2  . cetirizine (ZYRTEC) 10 MG tablet TAKE 1 TABLET (10 MG TOTAL) BY MOUTH DAILY.  30 tablet  10  . DEXILANT 60 MG capsule TAKE 1 CAPSULE EVERY DAY  30 capsule  3  . diazepam (VALIUM) 5 MG tablet Take 5 mg by mouth every 6 (six) hours as needed for anxiety.      . fluticasone (FLONASE) 50 MCG/ACT nasal spray Place 2 sprays into both nostrils at bedtime.  16 g  11  . HYDROcodone-acetaminophen (NORCO) 10-325 MG per tablet Take 1 tablet by mouth every 6 (six) hours as needed for pain.      . montelukast (SINGULAIR) 10 MG tablet Take 1 tablet (10 mg total) by mouth at bedtime.  30 tablet  3  . polyethylene glycol powder (GLYCOLAX/MIRALAX) powder Take 17 g by mouth 2 (two) times daily as needed.  527 g  1  . ranitidine (ZANTAC) 150 MG tablet Take 1 tablet (150 mg total) by mouth 2 (two) times daily.  60 tablet  0  . tiotropium (SPIRIVA HANDIHALER) 18 MCG inhalation capsule Place 1 capsule (18 mcg total) into inhaler and inhale daily.  90 capsule  4   No current facility-administered medications on file prior to visit.     Allergies  Allergen Reactions  . Aspirin  Nausea And Vomiting  . Morphine And Related Itching     Review of Systems  HENT: Positive for congestion, postnasal drip, rhinorrhea, sinus pressure and sneezing.   Respiratory: Positive for cough, chest tightness and shortness of breath. Negative for wheezing.   Cardiovascular: Negative for chest pain and palpitations.  Gastrointestinal: Positive for constipation. Negative for abdominal pain.  Skin: Negative for rash.  Neurological: Negative for weakness.  Hematological: Negative for adenopathy. Does not bruise/bleed easily.  Psychiatric/Behavioral: Negative for dysphoric mood. The patient is nervous/anxious.       BP 131/88  Pulse 91  Temp(Src) 98.7 F (37.1 C)  Resp 16  Ht 5' 7.5" (1.715 m)  Wt 179 lb (81.194 kg)  BMI 27.61 kg/m2  SpO2 95%      Objective:   Physical Exam  Nursing note and vitals reviewed. Constitutional: He is oriented to person, place, and time. He appears well-developed and well-nourished. No distress.  HENT:  Head: Normocephalic and atraumatic.  Right Ear: Tympanic membrane normal.  Left Ear: Tympanic membrane normal.  Nose: Mucosal edema present.  Mouth/Throat: Uvula is midline and mucous membranes are normal. No oropharyngeal exudate, posterior oropharyngeal edema or posterior oropharyngeal erythema.  Nasal mucosal pale, boggy edema  Eyes: Conjunctivae and EOM are normal.  Neck: Neck supple. No tracheal deviation present. No thyromegaly present.  Cardiovascular: Normal rate, regular rhythm, S1 normal and S2 normal.   No murmur heard. Pulmonary/Chest: Effort normal. No respiratory distress. He has no decreased breath sounds. He has no wheezes. He has no rhonchi. He has no rales.  Musculoskeletal: Normal range of motion.  Lymphadenopathy:    He has no cervical adenopathy.  Neurological: He is alert and oriented to person, place, and time.  Skin: Skin is warm and dry.  Psychiatric: He has a normal mood and affect. His behavior is normal.          Assessment & Plan:   Chronic airway obstruction, not elsewhere classified - Plan: CBC with Differential, Ambulatory referral to Pulmonology, methylPREDNISolone acetate (DEPO-MEDROL) injection 120 mg - Reviewed need to use LABA/ICS regularly as prescribed and prn albuterol for additional sxs. Gave coupon for symbicort.  Allergic rhinitis due to pollen  Encounter for long-term (current) use of other medications  Meds ordered this encounter  Medications  . montelukast (SINGULAIR) 10 MG tablet    Sig: Take 1 tablet (10 mg total) by mouth at bedtime.    Dispense:  30 tablet    Refill:  3  . Azelastine-Fluticasone (DYMISTA) 137-50 MCG/ACT SUSP    Sig: Place 1 spray into the nose 2 (two) times daily.    Dispense:  23 g    Refill:  11  .  budesonide-formoterol (SYMBICORT) 160-4.5 MCG/ACT inhaler    Sig: Inhale 2 puffs into the lungs 2 (two) times daily.    Dispense:  1 Inhaler    Refill:  11  . methylPREDNISolone acetate (DEPO-MEDROL) injection 120 mg    Sig:     I personally performed the services described in this documentation, which was scribed in my presence. The recorded information has been reviewed and considered, and addended by me as needed.  [name], MD MPH

## 2013-08-24 ENCOUNTER — Encounter: Payer: Self-pay | Admitting: Family Medicine

## 2013-09-01 ENCOUNTER — Institutional Professional Consult (permissible substitution): Payer: BC Managed Care – PPO | Admitting: Internal Medicine

## 2013-09-02 ENCOUNTER — Telehealth: Payer: Self-pay

## 2013-09-02 NOTE — Telephone Encounter (Signed)
Dr Brigitte Pulse   Patient has questions regarding the referral made for him.   Please call [phone]  Or his cell number [phone].

## 2013-09-02 NOTE — Telephone Encounter (Signed)
Spoke to pt, he stated medication was making him hoarse, i explained to him he needs to ensure he rinses his mouth after each use of inhaler. He stated he has been doing this.  He also wanted to know what to expect from the referral, i explained to him, there was no way of knowing how the doctor will evaluate him once he has arrived at his appointment.

## 2013-09-05 ENCOUNTER — Ambulatory Visit (INDEPENDENT_AMBULATORY_CARE_PROVIDER_SITE_OTHER): Payer: BC Managed Care – PPO | Admitting: Internal Medicine

## 2013-09-05 ENCOUNTER — Encounter: Payer: Self-pay | Admitting: Internal Medicine

## 2013-09-05 VITALS — BP 158/90 | HR 99 | Temp 98.2°F | Ht 68.0 in | Wt 178.0 lb

## 2013-09-05 DIAGNOSIS — J449 Chronic obstructive pulmonary disease, unspecified: Secondary | ICD-10-CM

## 2013-09-05 MED ORDER — UMECLIDINIUM-VILANTEROL 62.5-25 MCG/INH IN AEPB: 2.0000 | INHALATION_SPRAY | Freq: Once | RESPIRATORY_TRACT | Status: DC

## 2013-09-05 MED ORDER — PREDNISONE 10 MG PO TABS: ORAL_TABLET | ORAL | Status: DC

## 2013-09-05 NOTE — Progress Notes (Signed)
Subjective:    Patient ID: Elijah Marshall, male    DOB: [birth year]  MRN: [id number]  HPI  65 yobm quit smoking and able work out at [employer] and did fine until hurt back 2011 then 2 back surgeries and knee surgery then quit smoking 2012 and more noticeable  since then but also limited by back and referred to pulmonary clinic 09/05/2013 for ? Copd.  09/05/2013 1st [facility] office visit/ Haruki Arnold  Chief Complaint  Patient presents with  . Pulmonary Consult    Referred per Dr. Norberto Sorenson.  Pt c/o SOB for the past 3 yrs. He states that he was dxed with COPD back in 2012 or 2013.  He states that that he sometimes has trouble with breathing when walking up stairs and lifting things.    rx spiriva first, alb, dulera not as effective  No problem at rest or supine Assoc nasal congestion much better p shot (?depomedrol) and bad again x sev years corresponds to worse doe  Ex = walking one mile slower pace than wife,trouble with hills, worse in heat  Bad hb even on dexilant  Min dry cough   No obvious day to day or daytime variabilty or assoc cp or chest tightness, subjective wheeze overt sinus   symptoms. No unusual exp hx or h/o childhood pna/ asthma or knowledge of premature birth.  Sleeping ok without nocturnal  or early am exacerbation  of respiratory  c/o's or need for noct saba. Also denies any obvious fluctuation of symptoms with weather or environmental changes or other aggravating or alleviating factors except as outlined above   Current Medications, Allergies, Complete Past Medical History, Past Surgical History, Family History, and Social History were reviewed in [employer] record.  ROS  The following are not active complaints unless bolded sore throat, dysphagia, dental problems, itching, sneezing,  nasal congestion or excess/ purulent secretions, ear ache,   fever, chills, sweats, unintended wt loss, pleuritic or exertional cp, hemoptysis,  orthopnea pnd or leg  swelling, presyncope, palpitations, heartburn, abdominal pain, anorexia, nausea, vomiting, diarrhea  or change in bowel or urinary habits, change in stools or urine, dysuria,hematuria,  rash, arthralgias, visual complaints, headache, numbness weakness or ataxia or problems with walking or coordination,  change in mood/affect or memory.       Review of Systems  Constitutional: Negative for fever, chills, activity change, appetite change and unexpected weight change.  HENT: Positive for congestion and dental problem. Negative for postnasal drip, rhinorrhea, sneezing, sore throat, trouble swallowing and voice change.   Eyes: Negative for visual disturbance.  Respiratory: Positive for cough and shortness of breath. Negative for choking.   Cardiovascular: Negative for chest pain and leg swelling.  Gastrointestinal: Positive for abdominal pain. Negative for nausea and vomiting.  Genitourinary: Negative for difficulty urinating.       Acid heartburn  Indigestion  Musculoskeletal: Negative for arthralgias.  Skin: Negative for rash.  Psychiatric/Behavioral: Negative for behavioral problems and confusion.       Objective:   Physical Exam  amb bm nad   Wt Readings from Last 3 Encounters:  09/05/13 178 lb (80.74 kg)  08/15/13 179 lb (81.194 kg)  05/15/13 176 lb (79.833 kg)      HEENT mild turbinate edema.  Oropharynx no thrush or excess pnd or cobblestoning.  No JVD or cervical adenopathy. Mild accessory muscle hypertrophy. Trachea midline, nl thryroid. Chest was hyperinflated by percussion with diminished breath sounds and moderate increased exp time  without wheeze. Hoover sign positive at end inspiration. Regular rate and rhythm without murmur gallop or rub or increase P2 or edema.  Abd: no hsm, nl excursion. Ext warm without cyanosis or clubbing.    cxr 05/15/13 There is no edema or consolidation. The heart size and pulmonary  vascularity are normal. No adenopathy. There is mild degenerative    change in the thoracic spine.     Assessment & Plan:

## 2013-09-05 NOTE — Patient Instructions (Addendum)
Stop spiriva  Start anoro two puffs off one click each am Zantac 872 mg one at  Bedtime Prednisone 10 mg take  4 each am x 2 days,   2 each am x 2 days,  1 each am x 2 days and stop   GERD (REFLUX)  is an extremely common cause of respiratory symptoms, many times with no significant heartburn at all.    It can be treated with medication, but also with lifestyle changes including avoidance of late meals, excessive alcohol, smoking cessation, and avoid fatty foods, chocolate, peppermint, colas, red wine, and acidic juices such as orange juice.  NO MINT OR MENTHOL PRODUCTS SO NO COUGH DROPS  USE SUGARLESS CANDY INSTEAD (jolley ranchers or Stover's)  NO OIL BASED VITAMINS - use powdered substitutes.    Please schedule a follow up office visit in 4 weeks, sooner if needed with pfts

## 2013-09-06 NOTE — Assessment & Plan Note (Addendum)
-   Spirometry  03/01/12  FEV1  1.63 - Spirometry   09/05/13 FEV1  1.34   Not clear why he's progressing despite smoking in absence of more of and active asthma hx but need to confirm this with full fpt  DDX of  difficult airways management all start with A and  include Adherence, Ace Inhibitors, Acid Reflux, Active Sinus Disease, Alpha 1 Antitripsin deficiency, Anxiety masquerading as Airways dz,  ABPA,  allergy(esp in young), Aspiration (esp in elderly), Adverse effects of DPI,  Active smokers, plus two Bs  = Bronchiectasis and Beta blocker use..and one C= CHF  Adherence is always the initial "prime suspect" and is a multilayered concern that requires a "trust but verify" approach in every patient - starting with knowing how to use medications, especially inhalers, correctly, keeping up with refills and understanding the fundamental difference between maintenance and prns vs those medications only taken for a very short course and then stopped and not refilled.  - The proper method of use, as well as anticipated side effects, of a metered-dose inhaler are discussed and demonstrated to the patient. Improved effectiveness after extensive coaching during this visit to a level of approximately  90% with eliptica device so try anoro one each am  ? Acid (or non-acid) GERD > always difficult to exclude as up to 75% of pts in some series report no assoc GI/ Heartburn symptoms> rec cont max (24h)  acid suppression and diet restrictions/ reviewed and instructions given in writing.   ? Active sinus dz > consider sinus ct next  ? Allergy suggested by prev response to depomedrol  > Prednisone 10 mg take  4 each am x 2 days,   2 each am x 2 days,  1 each am x 2 days and stop     Each maintenance medication was reviewed in detail including most importantly the difference between maintenance and as needed and under what circumstances the prns are to be used.  Please see instructions for details which were reviewed in  writing and the patient given a copy.

## 2013-09-18 ENCOUNTER — Ambulatory Visit (INDEPENDENT_AMBULATORY_CARE_PROVIDER_SITE_OTHER): Payer: BC Managed Care – PPO | Admitting: Family Medicine

## 2013-09-18 VITALS — BP 144/98 | HR 88 | Temp 98.0°F | Resp 18 | Ht 67.0 in | Wt 176.2 lb

## 2013-09-18 DIAGNOSIS — R21 Rash and other nonspecific skin eruption: Secondary | ICD-10-CM

## 2013-09-18 DIAGNOSIS — L209 Atopic dermatitis, unspecified: Secondary | ICD-10-CM

## 2013-09-18 DIAGNOSIS — L2089 Other atopic dermatitis: Secondary | ICD-10-CM

## 2013-09-18 LAB — POCT SKIN KOH: Skin KOH, POC: NEGATIVE

## 2013-09-18 MED ORDER — BETAMETHASONE DIPROPIONATE 0.05 % EX CREA: TOPICAL_CREAM | Freq: Two times a day (BID) | CUTANEOUS | Status: DC

## 2013-09-18 NOTE — Patient Instructions (Signed)

## 2013-09-18 NOTE — Progress Notes (Signed)
Subjective:  This chart was scribed for Elijah Cheadle, MD by Ladene Artist, ED Scribe. The patient was seen in room 4. Patient's care was started at 1:09 PM.   Patient ID: Elijah Marshall, male    DOB: [birth year], 63 y.o.   MRN: [id number]  Chief Complaint  Patient presents with  . Rash    L Hip x 1 wk   HPI HPI Comments: Elijah Marshall is a 63 y.o. male who presents to the [facility] complaining of rash to L hip onset 1 week ago. Pt describes the rash as itchy. Pt also reports a non-itchy rash to L thigh first noted 2-3 days ago. He has applied previously prescribed triamcinolone cream 0.1% x3 daily with no relief. He also applied baby oil and cornstarch to the rash last night with mild relief.   Pt states that his appointment with Dr. Melvyn Novas went well. Pt was placed on Zantac for GERD. Next appointment is at the end of this month for a test.   Past Medical History  Diagnosis Date  . Allergy   . Ulcer   . COPD (chronic obstructive pulmonary disease)     per patient's health survey - he put a ?  . GERD (gastroesophageal reflux disease)   . Arthritis   . Shortness of breath    Current Outpatient Prescriptions on File Prior to Visit  Medication Sig Dispense Refill  . albuterol (PROVENTIL HFA;VENTOLIN HFA) 108 (90 BASE) MCG/ACT inhaler Inhale 2 puffs into the lungs every 6 (six) hours as needed for wheezing.  1 Inhaler  11  . cyclobenzaprine (FLEXERIL) 10 MG tablet Take 10 mg by mouth 3 (three) times daily as needed for muscle spasms.      . DEXILANT 60 MG capsule TAKE 1 CAPSULE EVERY DAY  30 capsule  3  . diazepam (VALIUM) 5 MG tablet Take 5 mg by mouth every 6 (six) hours as needed for anxiety.      Marland Kitchen HYDROcodone-acetaminophen (NORCO) 10-325 MG per tablet Take 1 tablet by mouth every 6 (six) hours as needed for pain.      . montelukast (SINGULAIR) 10 MG tablet Take 1 tablet (10 mg total) by mouth at bedtime.  30 tablet  3  . polyethylene glycol powder  (GLYCOLAX/MIRALAX) powder Take 17 g by mouth 2 (two) times daily as needed.  527 g  1  . ranitidine (ZANTAC) 150 MG tablet Take 1 tablet (150 mg total) by mouth 2 (two) times daily.  60 tablet  0  . Umeclidinium-Vilanterol (ANORO ELLIPTA) 62.5-25 MCG/INH AEPB Inhale 2 puffs into the lungs once. Only open the device one time and then take your two separate drags to be sure you get it all       No current facility-administered medications on file prior to visit.   Allergies  Allergen Reactions  . Aspirin Nausea And Vomiting  . Morphine And Related Itching   Review of Systems  Skin: Positive for rash.  Triage Vitals: BP 144/98  Pulse 88  Temp(Src) 98 F (36.7 C) (Oral)  Resp 18  Ht 5\' 7"  (1.702 m)  Wt 176 lb 3.2 oz (79.924 kg)  BMI 27.59 kg/m2  SpO2 100%    Objective:   Physical Exam  Nursing note and vitals reviewed. Constitutional: He is oriented to person, place, and time. He appears well-developed and well-nourished.  HENT:  Head: Normocephalic and atraumatic.  Eyes: Conjunctivae and EOM are normal.  Neck: Neck supple.  Cardiovascular: Normal rate.   Pulmonary/Chest: Effort normal.  Musculoskeletal: Normal range of motion.  Neurological: He is alert and oriented to person, place, and time.  Skin: Skin is warm and dry. Rash noted. Rash is macular.  Hyperpigmented macule over L lateral thigh approximately 2-3 in diameter  Negative fluorescence under wood's light  Psychiatric: He has a normal mood and affect. His behavior is normal.   Results for orders placed in visit on 09/18/13  POCT SKIN KOH      Result Value Ref Range   Skin KOH, POC Negative        Assessment & Plan:   Rash and nonspecific skin eruption - Plan: POCT Skin KOH  Atopic dermatitis  Meds ordered this encounter  Medications  . betamethasone dipropionate (DIPROLENE) 0.05 % cream    Sig: Apply topically 2 (two) times daily. To itchy areas x 2 wks only    Dispense:  45 g    Refill:  0    I  personally performed the services described in this documentation, which was scribed in my presence. The recorded information has been reviewed and considered, and addended by me as needed.  Elijah Cheadle, MD MPH

## 2013-09-30 ENCOUNTER — Telehealth: Payer: Self-pay | Admitting: Internal Medicine

## 2013-09-30 NOTE — Telephone Encounter (Signed)
Pt advised. Elijah Marshall, CMA  

## 2013-09-30 NOTE — Telephone Encounter (Signed)
Try advil cold and sinus otc and if not improving will need to be seen here by Tammy or by his primary

## 2013-09-30 NOTE — Telephone Encounter (Signed)
Attempted to call pt but memory is full.  Will try back later.

## 2013-09-30 NOTE — Telephone Encounter (Signed)
Spoke with pt, states that he does not have enough Anoro to last until his appt with MW on 8/31.  I advised him that I would place samples up front for him to pick up.  Pt also complains that he feels "like his head is in a vacuum" X3-4 days.  Sinus congestion but unable to produce any mucus. Jaw and cheek pain. No postnasal drainage, cough, fever.   Dr. Melvyn Novas please advise.  Thank you.

## 2013-10-06 ENCOUNTER — Ambulatory Visit: Payer: BC Managed Care – PPO | Admitting: Internal Medicine

## 2013-10-15 ENCOUNTER — Ambulatory Visit (INDEPENDENT_AMBULATORY_CARE_PROVIDER_SITE_OTHER): Payer: BC Managed Care – PPO | Admitting: Internal Medicine

## 2013-10-15 ENCOUNTER — Encounter: Payer: Self-pay | Admitting: Internal Medicine

## 2013-10-15 VITALS — BP 138/90 | HR 100 | Temp 97.9°F | Ht 68.5 in | Wt 176.0 lb

## 2013-10-15 DIAGNOSIS — J320 Chronic maxillary sinusitis: Secondary | ICD-10-CM

## 2013-10-15 DIAGNOSIS — J449 Chronic obstructive pulmonary disease, unspecified: Secondary | ICD-10-CM

## 2013-10-15 MED ORDER — METHYLPREDNISOLONE ACETATE 80 MG/ML IJ SUSP
120.0000 mg | Freq: Once | INTRAMUSCULAR | Status: AC
  Administered 2013-10-15: 120 mg via INTRAMUSCULAR

## 2013-10-15 NOTE — Patient Instructions (Signed)
Depomedrol 120 mg today  Please see patient coordinator before you leave today  to schedule sinus CT   Ok take advil cold and sinus for sinus /nasal congestion   Keep appt for pfts

## 2013-10-15 NOTE — Progress Notes (Signed)
Subjective:    Patient ID: Elijah Marshall, male    DOB: 1950-12-02  MRN: [id number]   Brief patient profile:  63 yobm quit smoking and able work out at [employer] and did fine until hurt back 2011 then 2 back surgeries and knee surgery then quit smoking 2012 and more noticeable  since then but also limited by back and referred to pulmonary clinic 09/05/2013 for ? Copd.   History of Present Illness  09/05/2013 1st [facility] office visit/ Elijah Marshall  Chief Complaint  Patient presents with  . Pulmonary Consult    Referred per Dr. Norberto Sorenson.  Pt c/o SOB for the past 3 yrs. He states that he was dxed with COPD back in 2012 or 2013.  He states that that he sometimes has trouble with breathing when walking up stairs and lifting things.    rx spiriva first, alb, dulera not as effective  No problem at rest or supine Assoc nasal congestion much better p shot (?depomedrol) and bad again x sev years corresponds to worse doe  Ex = walking one mile slower pace than wife,trouble with hills, worse in heat  Bad hb even on dexilant  Min dry cough  rec Stop spiriva  Start anoro two puffs off one click each am Zantac 150 mg one at  Bedtime Prednisone 10 mg take  4 each am x 2 days,   2 each am x 2 days,  1 each am x 2 days and stop  GERD diet    10/15/2013 f/u ov/Elijah Marshall re: copd GOLD III better on anoro, new c/o sinus congestion  Chief Complaint  Patient presents with  . Follow-up    Breathing is some better since the last visit. He c/o sinus congestion and HA at bedtime for the past 2 wks. He also c/o PND.   acute onset variably severe assoc with nasal congestion / mucus yellowish/ bilateral temp HA worse as day goes on and better in am/ occurred p upper incisor infection and already on clindamycin per dentist  Breathing better on anoro, no need for saba    No obvious day to day or daytime variabilty or assoc cp or chest tightness, subjective wheeze overt reflux  symptoms. No unusual exp hx or h/o  childhood pna/ asthma or knowledge of premature birth.  Sleeping ok without nocturnal  or early am exacerbation  of respiratory  c/o's or need for noct saba. Also denies any obvious fluctuation of symptoms with weather or environmental changes or other aggravating or alleviating factors except as outlined above   Current Medications, Allergies, Complete Past Medical History, Past Surgical History, Family History, and Social History were reviewed in [employer] record.  ROS  The following are not active complaints unless bolded sore throat, dysphagia, dental problems, itching, sneezing,  nasal congestion or excess/ purulent secretions, ear ache,   fever, chills, sweats, unintended wt loss, pleuritic or exertional cp, hemoptysis,  orthopnea pnd or leg swelling, presyncope, palpitations, heartburn, abdominal pain, anorexia, nausea, vomiting, diarrhea  or change in bowel or urinary habits, change in stools or urine, dysuria,hematuria,  rash, arthralgias, visual complaints, headache, numbness weakness or ataxia or problems with walking or coordination,  change in mood/affect or memory.             Objective:   Physical Exam  amb bm nad  10/15/2013         177  Wt Readings from Last 3 Encounters:  09/05/13 178 lb (80.74  kg)  08/15/13 179 lb (81.194 kg)  05/15/13 176 lb (79.833 kg)      HEENT mild turbinate edema. No tenderness over over R max sinus. Oropharynx no thrush or excess pnd or cobblestoning.  No JVD or cervical adenopathy. Mild accessory muscle hypertrophy. Trachea midline, nl thryroid. Chest was hyperinflated by percussion with diminished breath sounds and moderate increased exp time without wheeze. Hoover sign positive at end inspiration. Regular rate and rhythm without murmur gallop or rub or increase P2 or edema.  Abd: no hsm, nl excursion. Ext warm without cyanosis or clubbing.    cxr 05/15/13 There is no edema or consolidation. The heart size and pulmonary    vascularity are normal. No adenopathy. There is mild degenerative  change in the thoracic spine.     Assessment & Plan:

## 2013-10-17 NOTE — Assessment & Plan Note (Signed)
?   If sinus infection now secondary to R upper incisor infection  > sinus ct next step but in meantime ok to complete clindamycin

## 2013-10-17 NOTE — Assessment & Plan Note (Signed)
-   Spirometry  03/01/12  FEV1  1.63 - Spirometry   09/05/13 FEV1  1.34 (46%) ratio 44  - 09/05/2013  Walked RA x 3 laps @ fast pace @  185 ft each stopped due to end of study, fast pace,sat 89% at end   Adequate control on present rx, reviewed > no change in rx needed

## 2013-10-22 ENCOUNTER — Ambulatory Visit (INDEPENDENT_AMBULATORY_CARE_PROVIDER_SITE_OTHER)
Admission: RE | Admit: 2013-10-22 | Discharge: 2013-10-22 | Disposition: A | Discharge status: Home / Self Care | Payer: BC Managed Care – PPO | Source: Ambulatory Visit | Attending: Internal Medicine | Admitting: Internal Medicine

## 2013-10-22 DIAGNOSIS — J320 Chronic maxillary sinusitis: Secondary | ICD-10-CM

## 2013-10-23 ENCOUNTER — Encounter: Payer: Self-pay | Admitting: Internal Medicine

## 2013-10-23 NOTE — Progress Notes (Signed)
Quick Note:  Called spoke with patient, advised of CT results / recs as stated by MW. Pt verbalized his understanding and denied any questions. ______

## 2013-10-28 ENCOUNTER — Other Ambulatory Visit: Payer: Self-pay | Admitting: Internal Medicine

## 2013-10-28 DIAGNOSIS — J449 Chronic obstructive pulmonary disease, unspecified: Secondary | ICD-10-CM

## 2013-10-29 ENCOUNTER — Ambulatory Visit (INDEPENDENT_AMBULATORY_CARE_PROVIDER_SITE_OTHER): Payer: BC Managed Care – PPO | Admitting: Internal Medicine

## 2013-10-29 ENCOUNTER — Encounter: Payer: Self-pay | Admitting: Internal Medicine

## 2013-10-29 VITALS — BP 130/88 | HR 87 | Temp 98.4°F | Ht 67.0 in | Wt 174.0 lb

## 2013-10-29 DIAGNOSIS — J449 Chronic obstructive pulmonary disease, unspecified: Secondary | ICD-10-CM

## 2013-10-29 DIAGNOSIS — J4489 Other specified chronic obstructive pulmonary disease: Secondary | ICD-10-CM

## 2013-10-29 DIAGNOSIS — J329 Chronic sinusitis, unspecified: Secondary | ICD-10-CM

## 2013-10-29 LAB — PULMONARY FUNCTION TEST
DL/VA % pred: 53 %
DL/VA: 2.35 ml/min/mmHg/L
DLCO unc % pred: 48 %
DLCO unc: 13.77 ml/min/mmHg
FEF 25-75 Post: 0.69 L/sec
FEF 25-75 Pre: 0.7 L/sec
FEF2575-%Change-Post: -2 %
FEF2575-%Pred-Post: 27 %
FEF2575-%Pred-Pre: 27 %
FEV1-%Change-Post: 0 %
FEV1-%Pred-Post: 63 %
FEV1-%Pred-Pre: 62 %
FEV1-Post: 1.71 L
FEV1-Pre: 1.7 L
FEV1FVC-%Change-Post: 1 %
FEV1FVC-%Pred-Pre: 63 %
FEV6-%Change-Post: 0 %
FEV6-%Pred-Post: 96 %
FEV6-%Pred-Pre: 97 %
FEV6-Post: 3.27 L
FEV6-Pre: 3.28 L
FEV6FVC-%Change-Post: 1 %
FEV6FVC-%Pred-Post: 100 %
FEV6FVC-%Pred-Pre: 99 %
FVC-%Change-Post: -1 %
FVC-%Pred-Post: 96 %
FVC-%Pred-Pre: 97 %
FVC-Post: 3.39 L
FVC-Pre: 3.43 L
Post FEV1/FVC ratio: 51 %
Post FEV6/FVC ratio: 97 %
Pre FEV1/FVC ratio: 50 %
Pre FEV6/FVC Ratio: 96 %
RV % pred: 62 %
RV: 1.34 L
TLC % pred: 82 %
TLC: 5.25 L

## 2013-10-29 MED ORDER — UMECLIDINIUM-VILANTEROL 62.5-25 MCG/INH IN AEPB: 2.0000 | INHALATION_SPRAY | Freq: Once | RESPIRATORY_TRACT | Status: DC

## 2013-10-29 NOTE — Assessment & Plan Note (Addendum)
-   Spirometry  03/01/12  FEV1  1.63 - Spirometry   09/05/13 FEV1  1.34 (46%) ratio 44  - PFTs   10/29/2013    FEV1    1.70 (62%) ratio 50% no better after ssaba and DLCO 48%  - 09/05/2013  Walked RA x 3 laps @ fast pace @  185 ft each stopped due to end of study, fast pace,sat 89% at end   Adequate control on present rx, reviewed > no change in rx needed      Each maintenance medication was reviewed in detail including most importantly the difference between maintenance and as needed and under what circumstances the prns are to be used.  Please see instructions for details which were reviewed in writing and the patient given a copy.

## 2013-10-29 NOTE — Progress Notes (Signed)
Subjective:    Patient ID: Elijah Marshall, male    DOB: November 23, 1950  MRN: [id number]   Brief patient profile:  63 yobm quit smoking and able work out at [employer] and did fine until hurt back 2011 then 2 back surgeries and knee surgery then quit smoking 2012 and more noticeable  since then but also limited by back and referred to pulmonary clinic 09/05/2013 for ? Copd.   History of Present Illness  09/05/2013 1st [facility] office visit/ Elijah Marshall  Chief Complaint  Patient presents with  . Pulmonary Consult    Referred per Dr. Norberto Sorenson.  Pt c/o SOB for the past 3 yrs. He states that he was dxed with COPD back in 2012 or 2013.  He states that that he sometimes has trouble with breathing when walking up stairs and lifting things.    rx spiriva first, alb, dulera not as effective  No problem at rest or supine Assoc nasal congestion much better p shot (?depomedrol) and bad again x sev years corresponds to worse doe  Ex = walking one mile slower pace than wife,trouble with hills, worse in heat  Bad hb even on dexilant  Min dry cough  rec Stop spiriva  Start anoro two puffs off one click each am Zantac 150 mg one at  Bedtime Prednisone 10 mg take  4 each am x 2 days,   2 each am x 2 days,  1 each am x 2 days and stop  GERD diet    10/15/2013 f/u ov/Elijah Marshall re: copd GOLD II better on anoro, new c/o sinus congestion  Chief Complaint  Patient presents with  . Follow-up    Breathing is some better since the last visit. He c/o sinus congestion and HA at bedtime for the past 2 wks. He also c/o PND.   acute onset variably severe assoc with nasal congestion / mucus yellowish/ bilateral temp HA worse as day goes on and better in am/ occurred p upper incisor infection and already on clindamycin per dentist  Breathing better on anoro, no need for saba  rec Depomedrol 120 mg today  schedule sinus CT > neg Ok take advil cold and sinus for sinus /nasal congestion    10/29/2013 f/u ov/Elijah Marshall re:  GOLD  II, no need for saba on anoro daily  Chief Complaint  Patient presents with  . Follow-up    Breathing is some better- has not use rescue inhaler since the last visit. He states that he is coughing up minimal yellow sputum and still c/o sinus pressure.    cough worse x sev weeks with nasal congestion not using singulair consistently at all with neg sinus ct noted.  advil cold and sinus helps some. Not limited by breathing from desired activities    No obvious day to day or daytime variabilty or assoc cp or chest tightness, subjective wheeze overt reflux  symptoms. No unusual exp hx or h/o childhood pna/ asthma or knowledge of premature birth.  Sleeping ok without nocturnal  or early am exacerbation  of respiratory  c/o's or need for noct saba. Also denies any obvious fluctuation of symptoms with weather or environmental changes or other aggravating or alleviating factors except as outlined above.  Current Medications, Allergies, Complete Past Medical History, Past Surgical History, Family History, and Social History were reviewed in [employer] record.  ROS  The following are not active complaints unless bolded sore throat, dysphagia, dental problems, itching, sneezing,  nasal  congestion or excess/ purulent secretions, ear ache,   fever, chills, sweats, unintended wt loss, pleuritic or exertional cp, hemoptysis,  orthopnea pnd or leg swelling, presyncope, palpitations, heartburn, abdominal pain, anorexia, nausea, vomiting, diarrhea  or change in bowel or urinary habits, change in stools or urine, dysuria,hematuria,  rash, arthralgias, visual complaints, headache, numbness weakness or ataxia or problems with walking or coordination,  change in mood/affect or memory.             Objective:   Physical Exam  amb bm nad  10/15/2013         177  Wt Readings from Last 3 Encounters:  09/05/13 178 lb (80.74 kg)  08/15/13 179 lb (81.194 kg)  05/15/13 176 lb (79.833 kg)       HEENT mild turbinate edema. No tenderness over over R max sinus. Oropharynx no thrush or excess pnd or cobblestoning.  No JVD or cervical adenopathy. Mild accessory muscle hypertrophy. Trachea midline, nl thryroid. Chest was hyperinflated by percussion with diminished breath sounds and moderate increased exp time without wheeze. Hoover sign positive at end inspiration. Regular rate and rhythm without murmur gallop or rub or increase P2 or edema.  Abd: no hsm, nl excursion. Ext warm without cyanosis or clubbing.    cxr 05/15/13 There is no edema or consolidation. The heart size and pulmonary  vascularity are normal. No adenopathy. There is mild degenerative  change in the thoracic spine.     Assessment & Plan:

## 2013-10-29 NOTE — Progress Notes (Signed)
PFT done today. 

## 2013-10-29 NOTE — Patient Instructions (Addendum)
Start back on singulair daily x one month to see if helps cough or breathing.   No change in medications - if you need an alternative check with your pharmacist or your insurance drug plan to see what they are    If you are satisfied with your treatment plan,  let your doctor know and he/she can either refill your medications or you can return here when your prescription runs out.     If in any way you are not 100% satisfied,  please tell us.  If 100% better, tell your friends!  Pulmonary follow up is as needed

## 2013-10-29 NOTE — Assessment & Plan Note (Signed)
Sinus ct 10/22/13  Normal paranasal sinuses. - rechallenge with singulair and ENT f/u prn

## 2013-11-05 ENCOUNTER — Other Ambulatory Visit: Payer: Self-pay | Admitting: Family Medicine

## 2013-11-06 NOTE — Telephone Encounter (Signed)
Dr Brigitte Pulse, you saw pt in July for check up but don't see this med discussed. Can we RF?

## 2013-11-08 ENCOUNTER — Other Ambulatory Visit: Payer: Self-pay | Admitting: Family Medicine

## 2013-11-12 ENCOUNTER — Other Ambulatory Visit: Payer: Self-pay | Admitting: Family Medicine

## 2013-11-24 ENCOUNTER — Telehealth: Payer: Self-pay | Admitting: Internal Medicine

## 2013-11-24 MED ORDER — AZITHROMYCIN 250 MG PO TABS: ORAL_TABLET | ORAL | Status: DC

## 2013-11-24 NOTE — Telephone Encounter (Signed)
Called spoke with pt. Aware of recs.  Nothing further needed 

## 2013-11-24 NOTE — Telephone Encounter (Signed)
Called spoke with pt. C/o scratchy throat, prod cough white/yellow phlem, PND, nasal congestion. Pt is taking advil cold/sinus. Pt restarted singulair and has made a difference. Pt last seen 10/29/13. Told to f/u PRN. Please advise MW thanks  Allergies  Allergen Reactions  . Aspirin Nausea And Vomiting  . Morphine And Related Itching

## 2013-11-24 NOTE — Telephone Encounter (Signed)
Ok to do zpak

## 2013-12-06 ENCOUNTER — Emergency Department (HOSPITAL_COMMUNITY)
Admission: EM | Admit: 2013-12-06 | Discharge: 2013-12-06 | Disposition: A | Discharge status: Home / Self Care | Payer: BC Managed Care – PPO | Attending: Emergency Medicine | Admitting: Emergency Medicine

## 2013-12-06 ENCOUNTER — Emergency Department (HOSPITAL_COMMUNITY): Payer: BC Managed Care – PPO

## 2013-12-06 ENCOUNTER — Encounter (HOSPITAL_COMMUNITY): Payer: Self-pay | Admitting: Emergency Medicine

## 2013-12-06 DIAGNOSIS — R200 Anesthesia of skin: Secondary | ICD-10-CM | POA: Diagnosis not present

## 2013-12-06 DIAGNOSIS — Z87891 Personal history of nicotine dependence: Secondary | ICD-10-CM | POA: Diagnosis not present

## 2013-12-06 DIAGNOSIS — J449 Chronic obstructive pulmonary disease, unspecified: Secondary | ICD-10-CM | POA: Diagnosis not present

## 2013-12-06 DIAGNOSIS — M6281 Muscle weakness (generalized): Secondary | ICD-10-CM | POA: Insufficient documentation

## 2013-12-06 DIAGNOSIS — Z872 Personal history of diseases of the skin and subcutaneous tissue: Secondary | ICD-10-CM | POA: Insufficient documentation

## 2013-12-06 DIAGNOSIS — Z79899 Other long term (current) drug therapy: Secondary | ICD-10-CM | POA: Insufficient documentation

## 2013-12-06 DIAGNOSIS — R29898 Other symptoms and signs involving the musculoskeletal system: Secondary | ICD-10-CM | POA: Diagnosis not present

## 2013-12-06 DIAGNOSIS — M549 Dorsalgia, unspecified: Secondary | ICD-10-CM | POA: Insufficient documentation

## 2013-12-06 DIAGNOSIS — M1990 Unspecified osteoarthritis, unspecified site: Secondary | ICD-10-CM | POA: Diagnosis not present

## 2013-12-06 DIAGNOSIS — R42 Dizziness and giddiness: Secondary | ICD-10-CM | POA: Insufficient documentation

## 2013-12-06 DIAGNOSIS — K219 Gastro-esophageal reflux disease without esophagitis: Secondary | ICD-10-CM | POA: Diagnosis not present

## 2013-12-06 LAB — COMPREHENSIVE METABOLIC PANEL
ALT: 23 U/L (ref 0–53)
AST: 40 U/L — ABNORMAL HIGH (ref 0–37)
Albumin: 4.1 g/dL (ref 3.5–5.2)
Alkaline Phosphatase: 88 U/L (ref 39–117)
Anion gap: 12 (ref 5–15)
BUN: 10 mg/dL (ref 6–23)
CO2: 26 mEq/L (ref 19–32)
Calcium: 9.3 mg/dL (ref 8.4–10.5)
Chloride: 99 mEq/L (ref 96–112)
Creatinine, Ser: 0.87 mg/dL (ref 0.50–1.35)
GFR calc Af Amer: >90 mL/min (ref >90)
GFR calc non Af Amer: >90 mL/min (ref >90)
Glucose, Bld: 103 mg/dL — ABNORMAL HIGH (ref 70–99)
Potassium: 5.4 mEq/L — ABNORMAL HIGH (ref 3.7–5.3)
Sodium: 137 mEq/L (ref 137–147)
Total Bilirubin: 0.4 mg/dL (ref 0.3–1.2)
Total Protein: 7.7 g/dL (ref 6.0–8.3)

## 2013-12-06 LAB — CBC
HCT: 47.8 % (ref 39.0–52.0)
Hemoglobin: 16.5 g/dL (ref 13.0–17.0)
MCH: 30.6 pg (ref 26.0–34.0)
MCHC: 34.5 g/dL (ref 30.0–36.0)
MCV: 88.7 fL (ref 78.0–100.0)
Platelets: 225 10*3/uL (ref 150–400)
RBC: 5.39 MIL/uL (ref 4.22–5.81)
RDW: 13.6 % (ref 11.5–15.5)
WBC: 7.7 10*3/uL (ref 4.0–10.5)

## 2013-12-06 LAB — I-STAT TROPONIN, ED: Troponin i, poc: 0 ng/mL (ref 0.00–0.08)

## 2013-12-06 NOTE — ED Provider Notes (Signed)
CSN: [id number]     Arrival date & time 12/06/13  0307 History   First MD Initiated Contact with Patient 12/06/13 [phone]     Chief Complaint  Patient presents with  . Dizziness  . Numbness    face  . Extremity Weakness    and heaviness     (Consider location/radiation/quality/duration/timing/severity/associated sxs/prior Treatment) HPI Patient states he went to bed around 1 AM and was in his normal state of health. He woke around 2 AM with "tightness" to his entire face, lightheadedness and bilateral arm heaviness. The symptoms have now completely resolved. At no point did patient have any chest pain or shortness of breath. Patient denies having any previously similar symptoms. He denies any visual changes at any point. No voice changes. Past Medical History  Diagnosis Date  . Allergy   . Ulcer   . COPD (chronic obstructive pulmonary disease)     per patient's health survey - he put a ?  . GERD (gastroesophageal reflux disease)   . Arthritis   . Shortness of breath    Past Surgical History  Procedure Laterality Date  . Spine surgery  2012  . Colonoscopy  15    polyps rem  . Joint replacement Right 2010  . Anterior fusion lumbar spine  07/25/2012  . Anterior lat lumbar fusion Left 07/25/2012    Procedure: ANTERIOR LATERAL LUMBAR FUSION 1 LEVEL;  Surgeon: [name], MD;  Location: [facility];  Service: Orthopedics;  Laterality: Left;  Left sided lumbar 3-4 lateral interbody fusion with allograft and instrumentation   Family History  Problem Relation Age of Onset  . Cancer Mother     colon  . Heart disease Father   . Diabetes Sister   . Hypertension Sister   . Hypertension Brother   . Heart disease Maternal Grandmother     heart attack   History  Substance Use Topics  . Smoking status: Former Smoker -- 0.50 packs/day for 20 years    Types: Cigarettes    Quit date: 09/24/2011  . Smokeless tobacco: Never Used     Comment: occ beer  . Alcohol Use: Yes     Comment:  2 beers a week    Review of Systems  Constitutional: Negative for fever and chills.  Respiratory: Negative for shortness of breath.   Cardiovascular: Negative for chest pain.  Gastrointestinal: Negative for nausea, vomiting and abdominal pain.  Genitourinary: Negative for dysuria, frequency and flank pain.  Musculoskeletal: Positive for back pain. Negative for myalgias and neck stiffness.  Skin: Negative for rash and wound.  Neurological: Positive for dizziness, weakness (bilateral upper extremities) and light-headedness. Negative for seizures, syncope, numbness and headaches.  Psychiatric/Behavioral: Negative for confusion and agitation. The patient is not nervous/anxious.   All other systems reviewed and are negative.     Allergies  Aspirin and Morphine and related  Home Medications   Prior to Admission medications   Medication Sig Start Date End Date Taking? Authorizing Provider  Azelastine-Fluticasone 137-50 MCG/ACT SUSP Place 1 spray into the nose daily as needed. For seasonal allergies   Yes Historical Provider, MD  dexlansoprazole (DEXILANT) 60 MG capsule Take 60 mg by mouth daily.   Yes Historical Provider, MD  HYDROcodone-acetaminophen (NORCO) 7.5-325 MG per tablet Take 1 tablet by mouth every 6 (six) hours as needed for moderate pain.   Yes Historical Provider, MD  montelukast (SINGULAIR) 10 MG tablet Take 1 tablet (10 mg total) by mouth at bedtime. 08/15/13  Yes Harmon Pier  Ethlyn Daniels, MD  polyethylene glycol (MIRALAX / GLYCOLAX) packet Take 17 g by mouth daily as needed for mild constipation.   Yes Historical Provider, MD  ranitidine (ZANTAC) 150 MG tablet Take 150 mg by mouth 2 (two) times daily as needed for heartburn.   Yes Historical Provider, MD  Umeclidinium-Vilanterol (ANORO ELLIPTA) 62.5-25 MCG/INH AEPB Inhale 2 puffs into the lungs once. Only open the device one time and then take your two separate drags to be sure you get it all 10/29/13  Yes [name], MD  albuterol  (PROVENTIL HFA;VENTOLIN HFA) 108 (90 BASE) MCG/ACT inhaler Inhale 2 puffs into the lungs every 6 (six) hours as needed for wheezing. 01/15/13 01/15/14  [name], MD  cyclobenzaprine (FLEXERIL) 10 MG tablet Take 10 mg by mouth 3 (three) times daily as needed for muscle spasms.    Historical Provider, MD  diazepam (VALIUM) 5 MG tablet Take 5 mg by mouth every 6 (six) hours as needed for anxiety.    Historical Provider, MD   BP 161/104  Pulse 91  Temp(Src) 98.3 F (36.8 C) (Oral)  Resp 19  Ht 5\' 9"  (1.753 m)  Wt 178 lb (80.74 kg)  BMI 26.27 kg/m2  SpO2 96% Physical Exam  Nursing note and vitals reviewed. Constitutional: He is oriented to person, place, and time. He appears well-developed and well-nourished. No distress.  HENT:  Head: Normocephalic and atraumatic.  Mouth/Throat: Oropharynx is clear and moist.  Eyes: EOM are normal. Pupils are equal, round, and reactive to light.  Neck: Normal range of motion. Neck supple.  Cardiovascular: Normal rate and regular rhythm.   Pulmonary/Chest: Effort normal and breath sounds normal. No respiratory distress. He has no wheezes. He has no rales. He exhibits no tenderness.  Abdominal: Soft. Bowel sounds are normal. He exhibits no distension and no mass. There is no tenderness. There is no rebound and no guarding.  Musculoskeletal: Normal range of motion. He exhibits no edema and no tenderness.  Neurological: He is alert and oriented to person, place, and time.  Patient is alert and oriented x3 with clear, goal oriented speech. Patient has 5/5 motor in all extremities. Sensation is intact to light touch. Bilateral finger-to-nose is normal with no signs of dysmetria. Patient has a normal gait and walks without assistance.  Skin: Skin is warm and dry. No rash noted. No erythema.  Psychiatric: He has a normal mood and affect. His behavior is normal.    ED Course  Procedures (including critical care time) Labs Review Labs Reviewed  COMPREHENSIVE  METABOLIC PANEL - Abnormal; Notable for the following:    Potassium 5.4 (*)    Glucose, Bld 103 (*)    AST 40 (*)    Alkaline Phosphatase 8 (*)    All other components within normal limits  CBC  I-STAT TROPOININ, ED    Imaging Review Ct Head Wo Contrast  12/06/2013   CLINICAL DATA:  Bilateral upper extremity heaviness.  Dizziness.  EXAM: CT HEAD WITHOUT CONTRAST  TECHNIQUE: Contiguous axial images were obtained from the base of the skull through the vertex without intravenous contrast.  COMPARISON:  CT scan dated 12/09/2008  FINDINGS: No mass lesion. No midline shift. No acute hemorrhage or hematoma. No extra-axial fluid collections. No evidence of acute infarction. Brain parenchyma is normal. Osseous structures are normal.  IMPRESSION: Normal exam.   Electronically Signed   By: Rozetta Nunnery M.D.   On: 12/06/2013 04:24   Dg Chest Port 1 View  12/06/2013  CLINICAL DATA:  Facial numbness and upper extremity heaviness beginning at 2 a.m. ex smoker.  EXAM: PORTABLE CHEST - 1 VIEW  COMPARISON:  Chest radiograph May 15, 2013  FINDINGS: Cardiac silhouette is unremarkable. Mildly calcified aortic knob. The lungs are clear without pleural effusions or focal consolidations. Trachea projects midline and there is no pneumothorax. Soft tissue planes and included osseous structures are non-suspicious.  IMPRESSION: No active disease.   Electronically Signed   By: Elon Alas   On: 12/06/2013 03:47     EKG Interpretation None      Date: 12/06/2013  Rate: 91    Rhythm: normal sinus rhythm  QRS Axis: normal  Intervals: normal  ST/T Wave abnormalities: normal  Conduction Disutrbances:none  Narrative Interpretation:   Old EKG Reviewed: none available   MDM   Final diagnoses:  Arm heaviness      Patient continues to have a normal neurologic exam. Discussed patient's symptoms and workup with Dr. Doy Mince. Agreed this does not sound to be TIA/CVA related. No further workup in the  emergency Department recommended. Follow-up with primary doctor as an outpatient. Patient given return precautions and voices understanding.  Julianne Rice, MD 12/06/13 [phone]

## 2013-12-06 NOTE — ED Notes (Addendum)
Received call from lab a corrected value for Alk Phos of 88 earlier reported as 8. Writer spoke with B Cartner PA-C who is aware and feels no further action is needed.

## 2013-12-06 NOTE — Discharge Instructions (Signed)

## 2013-12-06 NOTE — ED Notes (Signed)
Bed: WA16 Expected date:  Expected time:  Means of arrival:  Comments: 

## 2014-01-16 ENCOUNTER — Emergency Department (HOSPITAL_COMMUNITY)
Admission: EM | Admit: 2014-01-16 | Discharge: 2014-01-16 | Disposition: A | Discharge status: Home / Self Care | Payer: BC Managed Care – PPO | Attending: Emergency Medicine | Admitting: Emergency Medicine

## 2014-01-16 ENCOUNTER — Emergency Department (HOSPITAL_COMMUNITY): Payer: BC Managed Care – PPO

## 2014-01-16 ENCOUNTER — Encounter (HOSPITAL_COMMUNITY): Payer: Self-pay | Admitting: Emergency Medicine

## 2014-01-16 DIAGNOSIS — K219 Gastro-esophageal reflux disease without esophagitis: Secondary | ICD-10-CM | POA: Diagnosis not present

## 2014-01-16 DIAGNOSIS — Z8739 Personal history of other diseases of the musculoskeletal system and connective tissue: Secondary | ICD-10-CM | POA: Diagnosis not present

## 2014-01-16 DIAGNOSIS — J441 Chronic obstructive pulmonary disease with (acute) exacerbation: Secondary | ICD-10-CM | POA: Diagnosis not present

## 2014-01-16 DIAGNOSIS — Z7951 Long term (current) use of inhaled steroids: Secondary | ICD-10-CM | POA: Diagnosis not present

## 2014-01-16 DIAGNOSIS — Z79899 Other long term (current) drug therapy: Secondary | ICD-10-CM | POA: Insufficient documentation

## 2014-01-16 DIAGNOSIS — Z87891 Personal history of nicotine dependence: Secondary | ICD-10-CM | POA: Diagnosis not present

## 2014-01-16 DIAGNOSIS — R9431 Abnormal electrocardiogram [ECG] [EKG]: Secondary | ICD-10-CM | POA: Diagnosis present

## 2014-01-16 LAB — CBC
HCT: 47.6 % (ref 39.0–52.0)
Hemoglobin: 13.4 g/dL (ref 13.0–17.0)
MCH: 25 pg — ABNORMAL LOW (ref 26.0–34.0)
MCHC: 28.2 g/dL — ABNORMAL LOW (ref 30.0–36.0)
MCV: 88.6 fL (ref 78.0–100.0)
Platelets: 177 10*3/uL (ref 150–400)
RBC: 5.37 MIL/uL (ref 4.22–5.81)
RDW: 13.5 % (ref 11.5–15.5)
WBC: 6.1 10*3/uL (ref 4.0–10.5)

## 2014-01-16 LAB — BASIC METABOLIC PANEL
Anion gap: 16 — ABNORMAL HIGH (ref 5–15)
BUN: 8 mg/dL (ref 6–23)
CO2: 23 mEq/L (ref 19–32)
Calcium: 9.4 mg/dL (ref 8.4–10.5)
Chloride: 98 mEq/L (ref 96–112)
Creatinine, Ser: 0.94 mg/dL (ref 0.50–1.35)
GFR calc Af Amer: >90 mL/min (ref >90)
GFR calc non Af Amer: 88 mL/min — ABNORMAL LOW (ref >90)
Glucose, Bld: 95 mg/dL (ref 70–99)
Potassium: 3.6 mEq/L — ABNORMAL LOW (ref 3.7–5.3)
Sodium: 137 mEq/L (ref 137–147)

## 2014-01-16 LAB — TROPONIN I: Troponin I: <0.3 ng/mL (ref <0.30)

## 2014-01-16 NOTE — ED Notes (Signed)
Pt states that around 4-5pm today that he started having [facility] and called EMS. Pt states that EMS told him his 12 lead they did at scene looked as if he was missing a beat (abnormal) and he needed to be seen at ED for further follow up. Pt refused to go to ED until his wife was off of work.  Pt denies any SHOB or chest pain at this time.

## 2014-01-16 NOTE — ED Provider Notes (Signed)
CSN: [id number]     Arrival date & time 01/16/14  1855 History   First MD Initiated Contact with Patient 01/16/14 1953     Chief Complaint  Patient presents with  . Abnormal ECG     (Consider location/radiation/quality/duration/timing/severity/associated sxs/prior Treatment) HPI Comments: Patient complaining of shortness of breath that began today at home. He has a history of COPD and does not readily use his inhaler. Symptoms occurred when he was cleaning the house. Denies any anginal type symptoms. Called 911 and was told to use of your inhaler which made him feel better. EMS found the patient to be in atrial tachycardia likely from his albuterol. He feels better at this time. States he did become very anxious and has a history of anxiety and takes Valium when necessary. No recent fever or productive cough. No vomiting or diarrhea. No fever or chills. He feels back to his baseline at this time  The history is provided by the patient.    Past Medical History  Diagnosis Date  . Allergy   . Ulcer   . COPD (chronic obstructive pulmonary disease)     per patient's health survey - he put a ?  . GERD (gastroesophageal reflux disease)   . Arthritis   . Shortness of breath    Past Surgical History  Procedure Laterality Date  . Spine surgery  2012  . Colonoscopy  14    polyps rem  . Joint replacement Right 2010  . Anterior fusion lumbar spine  07/25/2012  . Anterior lat lumbar fusion Left 07/25/2012    Procedure: ANTERIOR LATERAL LUMBAR FUSION 1 LEVEL;  Surgeon: [name], MD;  Location: [place];  Service: Orthopedics;  Laterality: Left;  Left sided lumbar 3-4 lateral interbody fusion with allograft and instrumentation   Family History  Problem Relation Age of Onset  . Cancer Mother     colon  . Heart disease Father   . Diabetes Sister   . Hypertension Sister   . Hypertension Brother   . Heart disease Maternal Grandmother     heart attack   History  Substance Use  Topics  . Smoking status: Former Smoker -- 0.50 packs/day for 20 years    Types: Cigarettes    Quit date: 09/24/2011  . Smokeless tobacco: Never Used     Comment: occ beer  . Alcohol Use: Yes     Comment: 2 beers a week    Review of Systems  All other systems reviewed and are negative.     Allergies  Aspirin and Morphine and related  Home Medications   Prior to Admission medications   Medication Sig Start Date End Date Taking? Authorizing Provider  albuterol (PROVENTIL HFA;VENTOLIN HFA) 108 (90 BASE) MCG/ACT inhaler Inhale 2 puffs into the lungs every 6 (six) hours as needed for wheezing or shortness of breath.   Yes Historical Provider, MD  Azelastine-Fluticasone 137-50 MCG/ACT SUSP Place 1 spray into the nose daily as needed. For seasonal allergies   Yes Historical Provider, MD  cyclobenzaprine (FLEXERIL) 10 MG tablet Take 10 mg by mouth 3 (three) times daily as needed for muscle spasms.   Yes Historical Provider, MD  dexlansoprazole (DEXILANT) 60 MG capsule Take 60 mg by mouth daily.   Yes Historical Provider, MD  diazepam (VALIUM) 5 MG tablet Take 5 mg by mouth every 6 (six) hours as needed for anxiety.   Yes Historical Provider, MD  montelukast (SINGULAIR) 10 MG tablet Take 1 tablet (10 mg total) by  mouth at bedtime. Patient taking differently: Take 10 mg by mouth 3 times/day as needed-between meals & bedtime (allergies).  08/15/13  Yes [name], MD  Phenylephrine-APAP-Guaifenesin [facility] FAST-MAX COLD & SINUS PO) Take 1 tablet by mouth 2 (two) times daily.   Yes Historical Provider, MD  polyethylene glycol (MIRALAX / GLYCOLAX) packet Take 17 g by mouth daily as needed for mild constipation.   Yes Historical Provider, MD  ranitidine (ZANTAC) 150 MG tablet Take 150 mg by mouth 2 (two) times daily as needed for heartburn.   Yes Historical Provider, MD  traMADol (ULTRAM) 50 MG tablet Take 50 mg by mouth every 6 (six) hours as needed for moderate pain.   Yes Historical Provider,  MD  Umeclidinium-Vilanterol (ANORO ELLIPTA) 62.5-25 MCG/INH AEPB Inhale 2 puffs into the lungs once. Only open the device one time and then take your two separate drags to be sure you get it all 10/29/13  Yes [name], MD  albuterol (PROVENTIL HFA;VENTOLIN HFA) 108 (90 BASE) MCG/ACT inhaler Inhale 2 puffs into the lungs every 6 (six) hours as needed for wheezing. 01/15/13 01/15/14  [name], MD   BP 157/97 mmHg  Pulse 93  Temp(Src) 98.4 F (36.9 C) (Oral)  Resp 16  SpO2 96% Physical Exam  Constitutional: He is oriented to person, place, and time. He appears well-developed and well-nourished.  Non-toxic appearance. No distress.  HENT:  Head: Normocephalic and atraumatic.  Eyes: Conjunctivae, EOM and lids are normal. Pupils are equal, round, and reactive to light.  Neck: Normal range of motion. Neck supple. No tracheal deviation present. No thyroid mass present.  Cardiovascular: Normal rate, regular rhythm and normal heart sounds.  Exam reveals no gallop.   No murmur heard. Pulmonary/Chest: Effort normal and breath sounds normal. No stridor. No respiratory distress. He has no decreased breath sounds. He has no wheezes. He has no rhonchi. He has no rales.  Abdominal: Soft. Normal appearance and bowel sounds are normal. He exhibits no distension. There is no tenderness. There is no rebound and no CVA tenderness.  Musculoskeletal: Normal range of motion. He exhibits no edema or tenderness.  Neurological: He is alert and oriented to person, place, and time. He has normal strength. No cranial nerve deficit or sensory deficit. GCS eye subscore is 4. GCS verbal subscore is 5. GCS motor subscore is 6.  Skin: Skin is warm and dry. No abrasion and no rash noted.  Psychiatric: He has a normal mood and affect. His speech is normal and behavior is normal.  Nursing note and vitals reviewed.   ED Course  Procedures (including critical care time) Labs Review Labs Reviewed  BASIC METABOLIC PANEL   CBC  TROPONIN I    Imaging Review Dg Chest 2 View (if Patient Has Fever And/or Copd)  01/16/2014   CLINICAL DATA:  Pt states that around 4-5pm today that he started having [facility] and called EMS. Pt states that EMS told him his 12 lead they did at scene looked as if he was missing a beat (abnormal) and he needed to be seen at ED for further follow up. Pt refused to go to ED until his wife was off of work. Pt denies any SHOB or chest pain at this time.  EXAM: CHEST  2 VIEW  COMPARISON:  12/06/2013 and 05/15/2013  FINDINGS: Lungs are adequately inflated without focal consolidation or effusion. Cardiomediastinal silhouette is within normal. There is mild degenerative change throughout the thoracic spine.  IMPRESSION: No active  cardiopulmonary disease.   Electronically Signed   By: Marin Olp M.D.   On: 01/16/2014 19:56     EKG Interpretation   Date/Time:  Friday January 16 2014 19:06:10 EST Ventricular Rate:  92 PR Interval:  192 QRS Duration: 87 QT Interval:  368 QTC Calculation: 455 R Axis:   -45 Text Interpretation:  Sinus rhythm LAD, consider left anterior fascicular  block Baseline wander in lead(s) II III aVL aVF No significant change  since last tracing Confirmed by Tasheba Henson  MD, Syliva Mee ([id number]) on 01/16/2014  7:54:04 PM      MDM   Final diagnoses:  None      Patient's labs and x-rays reviewed here. Do not think the patient has a PE. Patient's transient atrial tachycardia likely from the beta agonist use. He has been in sinus rhythm here during his evaluation. No wheezing noted. No concern for ACS. Stable for discharge  Leota Jacobsen, MD 01/16/14 2222

## 2014-01-16 NOTE — Discharge Instructions (Signed)
Use your inhaler as directed when you become short of breath. Follow-up with your doctor next week

## 2014-01-27 ENCOUNTER — Telehealth: Payer: Self-pay | Admitting: Internal Medicine

## 2014-01-27 NOTE — Telephone Encounter (Signed)
Pt calling with c/o's cough  OV with MW tomorrow at 3:15 pm

## 2014-01-28 ENCOUNTER — Ambulatory Visit (INDEPENDENT_AMBULATORY_CARE_PROVIDER_SITE_OTHER): Payer: BC Managed Care – PPO | Admitting: Internal Medicine

## 2014-01-28 ENCOUNTER — Encounter: Payer: Self-pay | Admitting: Internal Medicine

## 2014-01-28 VITALS — BP 128/84 | HR 108 | Temp 98.0°F | Ht 68.0 in | Wt 179.0 lb

## 2014-01-28 DIAGNOSIS — J310 Chronic rhinitis: Secondary | ICD-10-CM

## 2014-01-28 DIAGNOSIS — J449 Chronic obstructive pulmonary disease, unspecified: Secondary | ICD-10-CM

## 2014-01-28 MED ORDER — TIOTROPIUM BROMIDE MONOHYDRATE 2.5 MCG/ACT IN AERS: 2.0000 | INHALATION_SPRAY | Freq: Every day | RESPIRATORY_TRACT | Status: DC

## 2014-01-28 MED ORDER — BUDESONIDE-FORMOTEROL FUMARATE 160-4.5 MCG/ACT IN AERO: INHALATION_SPRAY | RESPIRATORY_TRACT | Status: DC

## 2014-01-28 MED ORDER — AMOXICILLIN-POT CLAVULANATE 875-125 MG PO TABS: 1.0000 | ORAL_TABLET | Freq: Two times a day (BID) | ORAL | Status: DC

## 2014-01-28 MED ORDER — PREDNISONE 10 MG PO TABS: ORAL_TABLET | ORAL | Status: DC

## 2014-01-28 NOTE — Assessment & Plan Note (Addendum)
-   Spirometry  03/01/12  FEV1  1.63 - Spirometry   09/05/13 FEV1  1.34 (46%) ratio 44  - PFTs   10/29/2013    FEV1    1.70 (62%) ratio 50% no better after ssaba and DLCO 48%  - 09/05/2013  Walked RA x 3 laps @ fast pace @  185 ft each stopped due to end of study, fast pace,sat 89% at end  - 01/06/14 gradual worsening on anoro plus increase need for saba   DDX of  difficult airways management all start with A and  include Adherence, Ace Inhibitors, Acid Reflux, Active Sinus Disease, Alpha 1 Antitripsin deficiency, Anxiety masquerading as Airways dz,  ABPA,  allergy(esp in young), Aspiration (esp in elderly), Adverse effects of DPI,  Active smokers, plus two Bs  = Bronchiectasis and Beta blocker use..and one C= CHF  Adherence is always the initial "prime suspect" and is a multilayered concern that requires a "trust but verify" approach in every patient - starting with knowing how to use medications, especially inhalers, correctly, keeping up with refills and understanding the fundamental difference between maintenance and prns vs those medications only taken for a very short course and then stopped and not refilled.  - The proper method of use, as well as anticipated side effects, of a metered-dose inhaler are discussed and demonstrated to the patient. Improved effectiveness after extensive coaching during this visit to a level of approximately  75% > try symb 160 2bid and spiriva respimat  ?allergy > no resp to singulair so d/c > Prednisone 10 mg take  4 each am x 2 days,   2 each am x 2 days,  1 each am x 2 days and stop   ? Acid (or non-acid) GERD > always difficult to exclude as up to 75% of pts in some series report no assoc GI/ Heartburn symptoms> rec continue max (24h)  acid suppression and diet restrictions/ reviewed     ? Active sinus dz/ rhinitis > see sep a/p    Each maintenance medication was reviewed in detail including most importantly the difference between maintenance and as needed and  under what circumstances the prns are to be used.  Please see instructions for details which were reviewed in writing and the patient given a copy.

## 2014-01-28 NOTE — Assessment & Plan Note (Addendum)
Sinus ct 10/22/13  Normal paranasal sinuses. - rechallenge with singulair 10/29/2013 > d/c 01/28/2014 as not better   Also no def response from dysmisat or flutisone ? atrovent trial next ?   ? Active rhinitis/ sinusitis despite neg sinus ct > Augmentin 875 mg take one pill twice daily  X 10 days  .

## 2014-01-28 NOTE — Patient Instructions (Addendum)
Prednisone 10 mg take  4 each am x 2 days,   2 each am x 2 days,  1 each am x 2 days and stop   Augmentin 875 mg take one pill twice daily  X 10 days - take at breakfast and supper with large glass of water.  It would help reduce the usual side effects (diarrhea and yeast infections) if you ate cultured yogurt at lunch.   Stop anoro  symbicort 160 Take 2 puffs first thing in am and then another 2 puffs about 12 hours later.   Spiriva respimat 2 pffs each am   Only use your albuterol(yellow/proventil)  as a rescue medication to be used if you can't catch your breath by resting or doing a relaxed purse lip breathing pattern.  - The less you use it, the better it will work when you need it. - Ok to use up to 2 puffs  every 4 hours if you must but call for immediate appointment if use goes up over your usual need - Don't leave home without it !!  (think of it like the spare tire for your car)   Please schedule a follow up office visit in 2 weeks, sooner if needed with drug formulary in hand

## 2014-01-28 NOTE — Progress Notes (Signed)
Subjective:    Patient ID: Elijah Marshall, male    DOB: [birth year]  MRN: [id number]   Brief patient profile:  63 yobm quit smoking and able work out at [employer] and did fine until hurt back 2011 then 2 back surgeries and knee surgery then quit smoking 2012 and more noticeable  since then but also limited by back and referred to pulmonary clinic 09/05/2013 for ? Copd  With GOLD II criteria 10/29/13     History of Present Illness  09/05/2013 1st  Pulmonary office visit/ Elijah Marshall  Chief Complaint  Patient presents with  . Pulmonary Consult    Referred per Dr. Norberto Sorenson.  Pt c/o SOB for the past 3 yrs. He states that he was dxed with COPD back in 2012 or 2013.  He states that that he sometimes has trouble with breathing when walking up stairs and lifting things.    rx spiriva first, alb, dulera not as effective  No problem at rest or supine Assoc nasal congestion much better p shot (?depomedrol) and bad again x sev years corresponds to worse doe  Ex = walking one mile slower pace than wife,trouble with hills, worse in heat  Bad hb even on dexilant  Min dry cough  rec Stop spiriva  Start anoro two puffs off one click each am Zantac 150 mg one at  Bedtime Prednisone 10 mg take  4 each am x 2 days,   2 each am x 2 days,  1 each am x 2 days and stop  GERD diet    10/15/2013 f/u ov/Elijah Marshall re: copd GOLD II better on anoro, new c/o sinus congestion  Chief Complaint  Patient presents with  . Follow-up    Breathing is some better since the last visit. He c/o sinus congestion and HA at bedtime for the past 2 wks. He also c/o PND.   acute onset variably severe assoc with nasal congestion / mucus yellowish/ bilateral temp HA worse as day goes on and better in am/ occurred p upper incisor infection and already on clindamycin per dentist  Breathing better on anoro, no need for saba  rec Depomedrol 120 mg today  schedule sinus CT > neg Ok take advil cold and sinus for sinus /nasal congestion     10/29/2013 f/u ov/Elijah Marshall re:  GOLD II, no need for saba on anoro daily  Chief Complaint  Patient presents with  . Follow-up    Breathing is some better- has not use rescue inhaler since the last visit. He states that he is coughing up minimal yellow sputum and still c/o sinus pressure.   cough worse x sev weeks with nasal congestion not using singulair consistently at all with neg sinus ct noted.  advil cold and sinus helps some. Not limited by breathing from desired activities   rec  singulair trial daily > x one month no better so d/c'd    01/28/2014 acute ov/ maint on anoro with aecopd assoc with fare of rhinitis sp trip to er for palpitations ? From over use of saba  Chief Complaint  Patient presents with  . Follow-up    pt c/o cough with yellow mucus, he has been taking mucinex. Patient reports cough worse at night. Patient denies chest tightness and wheeze. He has chest soreness   chest soreness gen anteriorly duing coughing fits, much worse since first week in Dec, indolent onset peristent daily some better with saba despite poor hfa   No obvious day to  day or daytime variabilty or assoc   chest tightness, subjective wheeze overt reflux  symptoms. No unusual exp hx or h/o childhood pna/ asthma or knowledge of premature birth.   Also denies any obvious fluctuation of symptoms with weather or environmental changes or other aggravating or alleviating factors except as outlined above.  Current Medications, Allergies, Complete Past Medical History, Past Surgical History, Family History, and Social History were reviewed in [employer] record.  ROS  The following are not active complaints unless bolded sore throat, dysphagia, dental problems, itching, sneezing,  nasal congestion or excess/ purulent secretions, ear ache,   fever, chills, sweats, unintended wt loss, pleuritic or exertional cp, hemoptysis,  orthopnea pnd or leg swelling, presyncope, palpitations,  heartburn, abdominal pain, anorexia, nausea, vomiting, diarrhea  or change in bowel or urinary habits, change in stools or urine, dysuria,hematuria,  rash, arthralgias, visual complaints, headache, numbness weakness or ataxia or problems with walking or coordination,  change in mood/affect or memory.             Objective:   Physical Exam  amb bm nad  10/15/2013         177 > 01/28/2014   179 Wt Readings from Last 3 Encounters:  09/05/13 178 lb (80.74 kg)  08/15/13 179 lb (81.194 kg)  05/15/13 176 lb (79.833 kg)      HEENT mild turbinate edema. No tenderness over over R max sinus. Oropharynx no thrush or excess pnd or cobblestoning.  No JVD or cervical adenopathy. Mild accessory muscle hypertrophy. Trachea midline, nl thryroid. Chest was hyperinflated by percussion with diminished breath sounds and moderate increased exp time with trace end exp bilateral  wheeze. Hoover sign positive at end inspiration. Regular rate and rhythm without murmur gallop or rub or increase P2 or edema.  Abd: no hsm, nl excursion. Ext warm without cyanosis or clubbing.        01/16/14 Lungs are adequately inflated without focal consolidation or effusion. Cardiomediastinal silhouette is within normal. There is mild degenerative change throughout the thoracic spinLungs are adequately inflated without focal consolidation or effusion. Cardiomediastinal silhouette is within normal. There is mild degenerative change throughout the thoracic spine     Assessment & Plan:

## 2014-02-06 DIAGNOSIS — N138 Other obstructive and reflux uropathy: Secondary | ICD-10-CM

## 2014-02-06 DIAGNOSIS — N401 Enlarged prostate with lower urinary tract symptoms: Secondary | ICD-10-CM

## 2014-02-06 HISTORY — DX: Benign prostatic hyperplasia with lower urinary tract symptoms: N40.1

## 2014-02-06 HISTORY — DX: Benign prostatic hyperplasia with lower urinary tract symptoms: N13.8

## 2014-02-11 ENCOUNTER — Ambulatory Visit (INDEPENDENT_AMBULATORY_CARE_PROVIDER_SITE_OTHER): Payer: BLUE CROSS/BLUE SHIELD | Admitting: Internal Medicine

## 2014-02-11 ENCOUNTER — Encounter: Payer: Self-pay | Admitting: Internal Medicine

## 2014-02-11 VITALS — BP 140/84 | HR 100 | Temp 98.6°F | Ht 69.0 in | Wt 181.0 lb

## 2014-02-11 DIAGNOSIS — J449 Chronic obstructive pulmonary disease, unspecified: Secondary | ICD-10-CM

## 2014-02-11 DIAGNOSIS — J310 Chronic rhinitis: Secondary | ICD-10-CM

## 2014-02-11 NOTE — Progress Notes (Signed)
Subjective:    Patient ID: Elijah Marshall, male    DOB: 12-19-1950  MRN: [id number]   Brief patient profile:  63 yobm quit smoking and able work out at [employer] and did fine until hurt back 2011 then 2 back surgeries and knee surgery then quit smoking 2012 and more noticeable  since then but also limited by back and referred to pulmonary clinic 09/05/2013 for ? Copd  With GOLD II criteria 10/29/13     History of Present Illness  09/05/2013 1st [facility] office visit/ Wert  Chief Complaint  Patient presents with  . Pulmonary Consult    Referred per Dr. [name].  Pt c/o SOB for the past 3 yrs. He states that he was dxed with COPD back in 2012 or 2013.  He states that that he sometimes has trouble with breathing when walking up stairs and lifting things.    rx spiriva first, alb, dulera not as effective  No problem at rest or supine Assoc nasal congestion much better p shot (?depomedrol) and bad again x sev years corresponds to worse doe  Ex = walking one mile slower pace than wife,trouble with hills, worse in heat  Bad hb even on dexilant  Min dry cough  rec Stop spiriva  Start anoro two puffs off one click each am Zantac 710 mg one at  Bedtime Prednisone 10 mg take  4 each am x 2 days,   2 each am x 2 days,  1 each am x 2 days and stop  GERD diet    10/15/2013 f/u ov/Wert re: copd GOLD II better on anoro, new c/o sinus congestion  Chief Complaint  Patient presents with  . Follow-up    Breathing is some better since the last visit. He c/o sinus congestion and HA at bedtime for the past 2 wks. He also c/o PND.   acute onset variably severe assoc with nasal congestion / mucus yellowish/ bilateral temp HA worse as day goes on and better in am/ occurred p upper incisor infection and already on clindamycin per dentist  Breathing better on anoro, no need for saba  rec Depomedrol 120 mg today  schedule sinus CT > neg Ok take advil cold and sinus for sinus /nasal congestion     10/29/2013 f/u ov/Wert re:  GOLD II, no need for saba on anoro daily  Chief Complaint  Patient presents with  . Follow-up    Breathing is some better- has not use rescue inhaler since the last visit. He states that he is coughing up minimal yellow sputum and still c/o sinus pressure.   cough worse x sev weeks with nasal congestion not using singulair consistently at all with neg sinus ct noted.  advil cold and sinus helps some. Not limited by breathing from desired activities   rec  singulair trial daily > x one month no better so d/c'd    01/28/2014 acute ov/ maint on anoro with aecopd assoc with fare of rhinitis sp trip to er for palpitations ? From over use of saba  Chief Complaint  Patient presents with  . Follow-up    pt c/o cough with yellow mucus, he has been taking mucinex. Patient reports cough worse at night. Patient denies chest tightness and wheeze. He has chest soreness   chest soreness gen anteriorly duing coughing fits, much worse since first week in Dec, indolent onset peristent daily some better with saba despite poor hfa  rec Prednisone 10 mg take  4 each am x 2 days,   2 each am x 2 days,  1 each am x 2 days and stop  Augmentin 875 mg take one pill twice daily  X 10 days - take at breakfast and supper with large glass of water.  It would help reduce the usual side effects (diarrhea and yeast infections) if you ate cultured yogurt at lunch.  Stop anoro Symbicort 160 Take 2 puffs first thing in am and then another 2 puffs about 12 hours later.  Spiriva respimat 2 pffs each am    02/11/2014 f/u ov/Wert re: GOLD II copd/  ran out of both symb/respimat x 2 week samples one day prior to [place] Complaint  Patient presents with  . Follow-up    Cough is some better since the last visit. He c/o ear pain, sinus pressure and HA for the past 2 days. He states that although the cough is better, he still coughs up minimal yellow sputum.   on symbicort 160/spiriva> improved  vs anoro rx and Not limited by breathing from desired activities  ? Still overusing saba (it's my habit)  No obvious day to day or daytime variabilty or assoc  chest tightness, subjective wheeze overt reflux  symptoms. No unusual exp hx or h/o childhood pna/ asthma or knowledge of premature birth.   Also denies any obvious fluctuation of symptoms with weather or environmental changes or other aggravating or alleviating factors except as outlined above.  Current Medications, Allergies, Complete Past Medical History, Past Surgical History, Family History, and Social History were reviewed in [employer] record.  ROS  The following are not active complaints unless bolded sore throat, dysphagia, dental problems, itching, sneezing,  nasal congestion or excess/ purulent secretions, ear ache,   fever, chills, sweats, unintended wt loss, pleuritic or exertional cp, hemoptysis,  orthopnea pnd or leg swelling, presyncope, palpitations, heartburn, abdominal pain, anorexia, nausea, vomiting, diarrhea  or change in bowel or urinary habits, change in stools or urine, dysuria,hematuria,  rash, arthralgias, visual complaints, headache, numbness weakness or ataxia or problems with walking or coordination,  change in mood/affect or memory.             Objective:   Physical Exam  amb bm nad  10/15/2013         177 > 01/28/2014   179> 02/11/2014  181  Wt Readings from Last 3 Encounters:  09/05/13 178 lb (80.74 kg)  08/15/13 179 lb (81.194 kg)  05/15/13 176 lb (79.833 kg)      HEENT mild turbinate edema. No tenderness over over R max sinus. Oropharynx no thrush or excess pnd or cobblestoning.  No JVD or cervical adenopathy. Mild accessory muscle hypertrophy. Trachea midline, nl thryroid. Chest was hyperinflated by percussion with diminished breath sounds and moderate increased exp time with min end exp bilateral  wheeze. Hoover sign positive at end inspiration. Regular rate and rhythm  without murmur gallop or rub or increase P2 or edema.  Abd: no hsm, nl excursion. Ext warm without cyanosis or clubbing.        01/16/14 Lungs are adequately inflated without focal consolidation or effusion. Cardiomediastinal silhouette is within normal. There is mild degenerative change throughout the thoracic spinLungs are adequately inflated without focal consolidation or effusion. Cardiomediastinal silhouette is within normal. There is mild degenerative change throughout the thoracic spine     Assessment & Plan:

## 2014-02-11 NOTE — Patient Instructions (Signed)
Plan A = automatic = symbicort 160 Take 2 puffs first thing in am and then another 2 puffs about 12 hours later and dexilant before bfast and zantac at bedtime  Plan B = backup Only use your albuterol (proventil) as a rescue medication to be used if you can't catch your breath by resting or doing a relaxed purse lip breathing pattern.  - The less you use it, the better it will work when you need it. - Ok to use up to 2 puffs  every 4 hours if you must but call for immediate appointment if use goes up over your usual need - Don't leave home without it !!  (think of it like the spare tire for your car)   GERD (REFLUX)  is an extremely common cause of respiratory symptoms just like yours , many times with no obvious heartburn at all.    It can be treated with medication, but also with lifestyle changes including avoidance of late meals, excessive alcohol, smoking cessation, and avoid fatty foods, chocolate, peppermint, colas, red wine, and acidic juices such as orange juice.  NO MINT OR MENTHOL PRODUCTS SO NO COUGH DROPS  USE SUGARLESS CANDY INSTEAD (Jolley ranchers or Stover's or Life Savers) or even ice chips will also do - the key is to swallow to prevent all throat clearing. NO OIL BASED VITAMINS - use powdered substitutes.    Please schedule a follow up office visit in 2  weeks, sooner if needed with all medications in meds

## 2014-02-25 ENCOUNTER — Ambulatory Visit (INDEPENDENT_AMBULATORY_CARE_PROVIDER_SITE_OTHER): Payer: BLUE CROSS/BLUE SHIELD | Admitting: Internal Medicine

## 2014-02-25 ENCOUNTER — Encounter: Payer: Self-pay | Admitting: Internal Medicine

## 2014-02-25 VITALS — BP 142/90 | HR 82 | Ht 68.0 in | Wt 179.6 lb

## 2014-02-25 DIAGNOSIS — J449 Chronic obstructive pulmonary disease, unspecified: Secondary | ICD-10-CM

## 2014-02-25 DIAGNOSIS — J310 Chronic rhinitis: Secondary | ICD-10-CM

## 2014-02-25 MED ORDER — BUDESONIDE-FORMOTEROL FUMARATE 80-4.5 MCG/ACT IN AERO: INHALATION_SPRAY | RESPIRATORY_TRACT | Status: DC

## 2014-02-25 NOTE — Patient Instructions (Addendum)
Dexilant Take 30-60 min before first meal of the day and Zantac 150 mg at bedtime  Symbicort 80 Take 2 puffs first thing in am and then another 2 puffs about 12 hours later.   Only use your albuterol (proventil) as a rescue medication to be used if you can't catch your breath by resting or doing a relaxed purse lip breathing pattern.  - The less you use it, the better it will work when you need it. - Ok to use up to 2 puffs  every 4 hours if you must but call for immediate appointment if use goes up over your usual need - Don't leave home without it !!  (think of it like the spare tire for your car)   Heartburn gaviscon liquid as needed > if not better you may need to see a GI doctor  For stuffy nose > stop ipatropium and use nasal saline > let dr Brigitte Pulse refer you to ENT doctor if needed   Pulmonary follow up is as needed

## 2014-02-25 NOTE — Progress Notes (Addendum)
Subjective:    Patient ID: Elijah Marshall, male    DOB: October 02, 1950  MRN: [id number]   Brief patient profile:  64  yobm quit smoking 2013  and able work out at [employer] and did fine until hurt back 2011 then 2 back surgeries and knee surgery  and more noticeable doe  since then but also limited by back and referred to pulmonary clinic 09/05/2013 for ? Copd  With GOLD II criteria 10/29/13     History of Present Illness  09/05/2013 1st [facility] office visit/ Elijah Marshall  Chief Complaint  Patient presents with  . Pulmonary Consult    Referred per Dr. Norberto Sorenson.  Pt c/o SOB for the past 3 yrs. He states that he was dxed with COPD back in 2012 or 2013.  He states that that he sometimes has trouble with breathing when walking up stairs and lifting things.    rx spiriva first, alb, dulera not as effective  No problem at rest or supine Assoc nasal congestion much better p shot (?depomedrol) and bad again x sev years corresponds to worse doe  Ex = walking one mile slower pace than wife,trouble with hills, worse in heat  Bad hb even on dexilant  Min dry cough  rec Stop spiriva  Start anoro two puffs off one click each am Zantac 150 mg one at  Bedtime Prednisone 10 mg take  4 each am x 2 days,   2 each am x 2 days,  1 each am x 2 days and stop  GERD diet    10/15/2013 f/u ov/Elijah Marshall re: copd GOLD II better on anoro, new c/o sinus congestion  Chief Complaint  Patient presents with  . Follow-up    Breathing is some better since the last visit. He c/o sinus congestion and HA at bedtime for the past 2 wks. He also c/o PND.   acute onset variably severe assoc with nasal congestion / mucus yellowish/ bilateral temp HA worse as day goes on and better in am/ occurred p upper incisor infection and already on clindamycin per dentist  Breathing better on anoro, no need for saba  rec Depomedrol 120 mg today  schedule sinus CT > neg Ok take advil cold and sinus for sinus /nasal congestion    10/29/2013  f/u ov/Elijah Marshall re:  GOLD II, no need for saba on anoro daily  Chief Complaint  Patient presents with  . Follow-up    Breathing is some better- has not use rescue inhaler since the last visit. He states that he is coughing up minimal yellow sputum and still c/o sinus pressure.   cough worse x sev weeks with nasal congestion not using singulair consistently at all with neg sinus ct noted.  advil cold and sinus helps some. Not limited by breathing from desired activities   rec  singulair trial daily > x one month no better so d/c'd    01/28/2014 acute ov/ maint on anoro with aecopd assoc with fare of rhinitis sp trip to er for palpitations ? From over use of saba  Chief Complaint  Patient presents with  . Follow-up    pt c/o cough with yellow mucus, he has been taking mucinex. Patient reports cough worse at night. Patient denies chest tightness and wheeze. He has chest soreness   chest soreness gen anteriorly duing coughing fits, much worse since first week in Dec, indolent onset peristent daily some better with saba despite poor hfa  rec Prednisone 10 mg  take  4 each am x 2 days,   2 each am x 2 days,  1 each am x 2 days and stop  Augmentin 875 mg take one pill twice daily  X 10 days - take at breakfast and supper with large glass of water.  It would help reduce the usual side effects (diarrhea and yeast infections) if you ate cultured yogurt at lunch.  Stop anoro Symbicort 160 Take 2 puffs first thing in am and then another 2 puffs about 12 hours later.  Spiriva respimat 2 pffs each am    02/11/2014 f/u ov/Elijah Marshall re: ran out of both symb/ spiriva  Chief Complaint  Patient presents with  . Follow-up    Cough is some better since the last visit. He c/o ear pain, sinus pressure and HA for the past 2 days. He states that although the cough is better, he still coughs up minimal yellow sputum.   rec Plan A = automatic = symbicort 160 Take 2 puffs first thing in am and then another 2 puffs about 12  hours later and dexilant before bfast and zantac at bedtime Plan B = backup Only use your albuterol (proventil) as a rescue medication GERD diet   Please schedule a follow up office visit in 2  weeks, sooner if needed with all medications in meds    02/25/2014 f/u ov/Elijah Marshall re: GOLD II / symbicort 160 2bid / no need for rescue at all  Chief Complaint  Patient presents with  . Follow-up    Pt states that his cough is some better. He c/o hoarsness for the past wk. His breathing is unchanged.    Main issue is hoarsenss and throat tickle nasal stuffiness/ advil cold and sinus best, no purulent nasal secretions    No obvious day to day or daytime variabilty or assoc   chest tightness, subjective wheeze overt reflux  symptoms. No unusual exp hx or h/o childhood pna/ asthma or knowledge of premature birth.   Also denies any obvious fluctuation of symptoms with weather or environmental changes or other aggravating or alleviating factors except as outlined above.  Current Medications, Allergies, Complete Past Medical History, Past Surgical History, Family History, and Social History were reviewed in [employer] record.  ROS  The following are not active complaints unless bolded sore throat, dysphagia, dental problems, itching, sneezing,  nasal congestion or excess/ purulent secretions, ear ache,   fever, chills, sweats, unintended wt loss, pleuritic or exertional cp, hemoptysis,  orthopnea pnd or leg swelling, presyncope, palpitations, heartburn, abdominal pain, anorexia, nausea, vomiting, diarrhea  or change in bowel or urinary habits, change in stools or urine, dysuria,hematuria,  rash, arthralgias, visual complaints, headache, numbness weakness or ataxia or problems with walking or coordination,  change in mood/affect or memory.             Objective:   Physical Exam  amb bm nad  10/15/2013         177 > 01/28/2014   179> 02/11/2014  181 > 02/25/2014 180  Wt Readings from  Last 3 Encounters:  09/05/13 178 lb (80.74 kg)  08/15/13 179 lb (81.194 kg)  05/15/13 176 lb (79.833 kg)      HEENT mild turbinate edema. No tenderness over over R max sinus. Oropharynx no thrush or excess pnd or cobblestoning.  No JVD or cervical adenopathy. Mild accessory muscle hypertrophy. Trachea midline, nl thryroid. Chest was hyperinflated by percussion with diminished breath sounds and mild exp time with  trace end exp. Hoover sign positive at end inspiration. Regular rate and rhythm without murmur gallop or rub or increase P2 or edema.  Abd: no hsm, nl excursion. Ext warm without cyanosis or clubbing.        01/16/14 Lungs are adequately inflated without focal consolidation or effusion. Cardiomediastinal silhouette is within normal. There is mild degenerative change throughout the thoracic spinLungs are adequately inflated without focal consolidation or effusion. Cardiomediastinal silhouette is within normal. There is mild degenerative change throughout the thoracic spine     Assessment & Plan:   Outpatient Encounter Prescriptions as of 02/25/2014  Medication Sig  . albuterol (PROVENTIL HFA;VENTOLIN HFA) 108 (90 BASE) MCG/ACT inhaler Inhale 2 puffs into the lungs every 6 (six) hours as needed for wheezing or shortness of breath.  . budesonide-formoterol (SYMBICORT) 160-4.5 MCG/ACT inhaler Take 2 puffs first thing in am and then another 2 puffs about 12 hours later.  . cyclobenzaprine (FLEXERIL) 10 MG tablet Take 10 mg by mouth 3 (three) times daily as needed for muscle spasms.  Marland Kitchen dexlansoprazole (DEXILANT) 60 MG capsule Take 60 mg by mouth daily.  . diazepam (VALIUM) 5 MG tablet Take 5 mg by mouth every 6 (six) hours as needed for anxiety.  Marland Kitchen HYDROcodone-acetaminophen (NORCO/VICODIN) 5-325 MG per tablet Take 1 tablet by mouth as needed.  Marland Kitchen Phenylephrine-APAP-Guaifenesin (MUCINEX FAST-MAX COLD & SINUS PO) Take 1 tablet by mouth 2 (two) times daily.  . polyethylene glycol (MIRALAX  / GLYCOLAX) packet Take 17 g by mouth daily as needed for mild constipation.  . ranitidine (ZANTAC) 150 MG tablet Take 150 mg by mouth 2 (two) times daily as needed for heartburn.  . traMADol (ULTRAM) 50 MG tablet Take 50 mg by mouth every 6 (six) hours as needed for moderate pain.  . budesonide-formoterol (SYMBICORT) 80-4.5 MCG/ACT inhaler Take 2 puffs first thing in am and then another 2 puffs about 12 hours later.  . [DISCONTINUED] Tiotropium Bromide Monohydrate (SPIRIVA RESPIMAT) 2.5 MCG/ACT AERS Inhale 2 puffs into the lungs daily. (Patient not taking: Reported on 02/11/2014)

## 2014-02-25 NOTE — Assessment & Plan Note (Addendum)
-   Spirometry  03/01/12  FEV1  1.63 - Spirometry   09/05/13 FEV1  1.34 (46%) ratio 44  - PFTs   10/29/2013    FEV1    1.70 (62%) ratio 50% no better after ssaba and DLCO 48%  - 09/05/2013  Walked RA x 3 laps @ fast pace @  185 ft each stopped due to end of study, fast pace,sat 89% at end  - 01/06/14 gradual worsening on anoro plus increase need for saba  - 01/28/2014   > try spiriva respimat/ symbicort 160 2bid > changed to symbicort 160 2bid only on 01/28/14   I had an extended discussion with the patient reviewing all relevant studies completed to date and  lasting 15 to 20 minutes of a 25 minute visit on the following ongoing concerns: 1) his breathing is clearly better s need for lama and main c/o = hoarseness/ tickle so should try symbiocrt 80 2bid and see if breathing just as good s throat irritation 2) no need for spiriva/ lama 3) pulmonary f/u can be prn increase symptoms or need for saba

## 2014-02-25 NOTE — Assessment & Plan Note (Signed)
Sinus ct 10/22/13  Normal paranasal sinuses. - rechallenge with singulair 10/29/2013 > d/c 01/28/2014 as not better   Neg resp to dymista/fluticasone/ atrovent > f/u ent next, just use NS in meantime

## 2014-02-26 ENCOUNTER — Encounter: Payer: Self-pay | Admitting: Internal Medicine

## 2014-02-26 NOTE — Assessment & Plan Note (Signed)
Sinus ct 10/22/13  Normal paranasal sinuses. - rechallenge with singulair 10/29/2013 > d/c 01/28/2014 as not better    No better but so far no objective evidence of sinus dz >  ? Acid (or non-acid) GERD > always difficult to exclude as up to 75% of pts in some series report no assoc GI/ Heartburn symptoms> rec max (24h)  acid suppression and diet restrictions/ reviewed and instructions given in writing.

## 2014-02-26 NOTE — Assessment & Plan Note (Signed)
-   Spirometry  03/01/12  FEV1  1.63 - Spirometry   09/05/13 FEV1  1.34 (46%) ratio 44  - PFTs   10/29/2013    FEV1    1.70 (62%) ratio 50% no better after ssaba and DLCO 48%  - 09/05/2013  Walked RA x 3 laps @ fast pace @  185 ft each stopped due to end of study, fast pace,sat 89% at end  - 01/06/14 gradual worsening on anoro plus increase need for saba  - 01/28/2014   > try spiriva respimat/ symbicort 160 2bid    Clearly better and only moderate copd so should be able to tol just the symbicort 160 and prn saba if he understands how to use it   The proper method of use, as well as anticipated side effects, of a metered-dose inhaler are discussed and demonstrated to the patient. Improved effectiveness after extensive coaching during this visit to a level of approximately  90%     Each maintenance medication was reviewed in detail including most importantly the difference between maintenance and as needed and under what circumstances the prns are to be used.  Please see instructions for details which were reviewed in writing and the patient given a copy.

## 2014-03-10 ENCOUNTER — Telehealth: Payer: Self-pay | Admitting: Internal Medicine

## 2014-03-10 MED ORDER — PREDNISONE 10 MG PO TABS: ORAL_TABLET | ORAL | Status: DC

## 2014-03-10 MED ORDER — AZITHROMYCIN 250 MG PO TABS: ORAL_TABLET | ORAL | Status: DC

## 2014-03-10 NOTE — Addendum Note (Signed)
Addended by: Doroteo Glassman D on: 03/10/2014 10:19 AM   Modules accepted: Orders

## 2014-03-10 NOTE — Telephone Encounter (Signed)
Zpak/ Prednisone 10 mg take  4 each am x 2 days,   2 each am x 2 days,  1 each am x 2 days and stop  Ov in one week if not back to baseline

## 2014-03-10 NOTE — Telephone Encounter (Signed)
Message closed in error. 

## 2014-03-10 NOTE — Telephone Encounter (Signed)
Spoke with pt and advised of Dr Gustavus Bryant recommendations.  Rx sent to pharmacy.

## 2014-03-10 NOTE — Telephone Encounter (Signed)
Spoke with pt. States that for the last week he has been having chest congestion and coughing. Coughing up yellow mucus, this morning there was blood mixed in. Chest is sore from all the coughing. Would like something called in.  MW - please advise. Thanks.

## 2014-04-04 ENCOUNTER — Other Ambulatory Visit: Payer: Self-pay | Admitting: Family Medicine

## 2014-04-07 ENCOUNTER — Other Ambulatory Visit: Payer: Self-pay | Admitting: Family Medicine

## 2014-05-08 ENCOUNTER — Other Ambulatory Visit: Payer: Self-pay | Admitting: Family Medicine

## 2014-06-02 ENCOUNTER — Telehealth: Payer: Self-pay | Admitting: Internal Medicine

## 2014-06-02 MED ORDER — AZITHROMYCIN 250 MG PO TABS: ORAL_TABLET | ORAL | Status: DC

## 2014-06-02 MED ORDER — PREDNISONE 10 MG PO TABS: ORAL_TABLET | ORAL | Status: DC

## 2014-06-02 NOTE — Telephone Encounter (Signed)
zpak If sob/ wheeze > Prednisone 10 mg take  4 each am x 2 days,   2 each am x 2 days,  1 each am x 2 days and stop  F/u one week if not better

## 2014-06-02 NOTE — Telephone Encounter (Signed)
Pt c/o prod cough with yellow mucus, sinus congestion, eye pain X2 weeks.  Denies fever.   Has been taking advil cold and sinus.  [facility] [address]    MW please advise.  Thanks!

## 2014-06-02 NOTE — Telephone Encounter (Signed)
Pt aware of recs. RX sent in. Nothing further needed 

## 2014-06-08 ENCOUNTER — Ambulatory Visit (INDEPENDENT_AMBULATORY_CARE_PROVIDER_SITE_OTHER): Payer: BLUE CROSS/BLUE SHIELD | Admitting: Emergency Medicine

## 2014-06-08 VITALS — BP 150/94 | HR 99 | Temp 98.2°F | Resp 18 | Ht 68.0 in | Wt 176.2 lb

## 2014-06-08 DIAGNOSIS — K219 Gastro-esophageal reflux disease without esophagitis: Secondary | ICD-10-CM | POA: Insufficient documentation

## 2014-06-08 MED ORDER — SUCRALFATE 1 G PO TABS: ORAL_TABLET | ORAL | Status: DC

## 2014-06-08 MED ORDER — DEXLANSOPRAZOLE 60 MG PO CPDR: 60.0000 mg | DELAYED_RELEASE_CAPSULE | Freq: Every day | ORAL | Status: DC

## 2014-06-08 NOTE — Progress Notes (Signed)
[facility] [address], [place] [address] [phone]  Date:  06/08/2014   Name:  Elijah Marshall   DOB:  05-Jan-1951   MRN:  [id number]  PCP:  [name], MD    Chief Complaint: excessive burping; Abdominal Pain; Gastrophageal Reflux; and Medication Refill   History of Present Illness:  Elijah Marshall is a 64 y.o. very pleasant male patient who presents with the following:  History of GERD on dexilant Has been out of medication for 3-4 weeks  Now having increased heartburn and waterbrash No nausea or vomiting.  No stool change No cough, wheezing or shortness of breath. No fever or chills No improvement with over the counter medications or other home remedies.  Denies other complaint or health concern today.    Patient Active Problem List   Diagnosis Date Noted  . GERD (gastroesophageal reflux disease) 06/08/2014  . Chronic rhinitis 10/30/2012  . COPD GOLD II  09/26/2011    Past Medical History  Diagnosis Date  . Allergy   . Ulcer   . COPD (chronic obstructive pulmonary disease)     per patient's health survey - he put a ?  . GERD (gastroesophageal reflux disease)   . Arthritis   . Shortness of breath     Past Surgical History  Procedure Laterality Date  . Spine surgery  2012  . Colonoscopy  14    polyps rem  . Joint replacement Right 2010  . Anterior fusion lumbar spine  07/25/2012  . Anterior lat lumbar fusion Left 07/25/2012    Procedure: ANTERIOR LATERAL LUMBAR FUSION 1 LEVEL;  Surgeon: [name], MD;  Location: [facility];  Service: Orthopedics;  Laterality: Left;  Left sided lumbar 3-4 lateral interbody fusion with allograft and instrumentation    History  Substance Use Topics  . Smoking status: Former Smoker -- 0.50 packs/day for 20 years    Types: Cigarettes    Quit date: 09/24/2011  . Smokeless tobacco: Never Used     Comment: occ beer  . Alcohol Use: Yes     Comment: 2 beers a week    Family History  Problem  Relation Age of Onset  . Cancer Mother     colon  . Heart disease Father   . Diabetes Sister   . Hypertension Sister   . Hypertension Brother   . Heart disease Maternal Grandmother     heart attack    Allergies  Allergen Reactions  . Aspirin Nausea And Vomiting  . Morphine And Related Itching    Medication list has been reviewed and updated.  Current Outpatient Prescriptions on File Prior to Visit  Medication Sig Dispense Refill  . albuterol (PROVENTIL HFA;VENTOLIN HFA) 108 (90 BASE) MCG/ACT inhaler Inhale 2 puffs into the lungs every 6 (six) hours as needed for wheezing or shortness of breath.    . budesonide-formoterol (SYMBICORT) 80-4.5 MCG/ACT inhaler Take 2 puffs first thing in am and then another 2 puffs about 12 hours later. 1 Inhaler 11  . cyclobenzaprine (FLEXERIL) 10 MG tablet Take 10 mg by mouth 3 (three) times daily as needed for muscle spasms.    . diazepam (VALIUM) 5 MG tablet Take 5 mg by mouth every 6 (six) hours as needed for anxiety.    Marland Kitchen HYDROcodone-acetaminophen (NORCO/VICODIN) 5-325 MG per tablet Take 1 tablet by mouth as needed.  0  . polyethylene glycol (MIRALAX / GLYCOLAX) packet Take 17 g by mouth daily as needed for mild constipation.    Marland Kitchen  ranitidine (ZANTAC) 150 MG tablet Take 150 mg by mouth 2 (two) times daily as needed for heartburn.    . traMADol (ULTRAM) 50 MG tablet Take 50 mg by mouth every 6 (six) hours as needed for moderate pain.    . budesonide-formoterol (SYMBICORT) 160-4.5 MCG/ACT inhaler Take 2 puffs first thing in am and then another 2 puffs about 12 hours later. (Patient not taking: Reported on 06/08/2014)    . dexlansoprazole (DEXILANT) 60 MG capsule Take 60 mg by mouth daily.    Marland Kitchen Phenylephrine-APAP-Guaifenesin (MUCINEX FAST-MAX COLD & SINUS PO) Take 1 tablet by mouth 2 (two) times daily.     No current facility-administered medications on file prior to visit.    Review of Systems:  Review of Systems  Constitutional: Negative for  fever, chills and fatigue.  HENT: Negative for congestion, ear pain, hearing loss, postnasal drip, rhinorrhea and sinus pressure.   Eyes: Negative for discharge and redness.  Respiratory: Negative for cough, shortness of breath and wheezing.   Cardiovascular: Negative for chest pain and leg swelling.  Gastrointestinal: Negative for nausea, vomiting, abdominal pain, constipation and blood in stool.  Genitourinary: Negative for dysuria, urgency and frequency.  Musculoskeletal: Negative for neck stiffness.  Skin: Negative for rash.  Neurological: Negative for seizures, weakness and headaches.     Physical Examination: Filed Vitals:   06/08/14 2104  BP: 150/94  Pulse: 99  Temp: 98.2 F (36.8 C)  Resp: 18   Filed Vitals:   06/08/14 2104  Height: '5\' 8"'$  (1.727 m)  Weight: 176 lb 3.2 oz (79.924 kg)   Body mass index is 26.8 kg/(m^2). Ideal Body Weight: Weight in (lb) to have BMI = 25: 164.1   GEN: WDWN, NAD, Non-toxic, Alert & Oriented x 3 HEENT: Atraumatic, Normocephalic.  Ears and Nose: No external deformity. EXTR: No clubbing/cyanosis/edema NEURO: Normal gait.  PSYCH: Normally interactive. Conversant. Not depressed or anxious appearing.  Calm demeanor.  Chest;  Clear BS= ABD:  Soft, not tender  Assessment and Plan: GERD Refills carafate GI   Signed Ellison Carwin, MD

## 2014-07-02 ENCOUNTER — Telehealth: Payer: Self-pay | Admitting: Internal Medicine

## 2014-07-02 DIAGNOSIS — K219 Gastro-esophageal reflux disease without esophagitis: Secondary | ICD-10-CM

## 2014-07-02 NOTE — Telephone Encounter (Signed)
Fine with me to refer here but if has seen Collene Mares before should refer back to her

## 2014-07-02 NOTE — Telephone Encounter (Signed)
Pt saw his PCP 06/08/14 and requested a referral to GI Pt states that he was recommended a physician and was told an order/referral was placed but never heard from GI.  Per referral notes, GI spoke with the patient one time on 06/11/14 and that was the last documented communication, pt states that he didn't hear anything else from them about scheduling an appt. (Dr Mann/Dr Carlean Purl) Pt is wanting to know if there is someone that you would recommend him seeing? Pt wanting to know if Dr Melvyn Novas could place the referral? Please advise Dr Melvyn Novas. Thanks.

## 2014-07-02 NOTE — Telephone Encounter (Signed)
Pt aware and referral placed.

## 2014-07-02 NOTE — Telephone Encounter (Signed)
No chen > refer to our group

## 2014-07-02 NOTE — Telephone Encounter (Signed)
Spoke with pt, states that he does not remember ever seeing a Dr Collene Mares. Pt states that he thinks their name was Dr Bridgett Larsson?? I advised that I was having a hard time finding this MD. Please advise Dr Melvyn Novas if you are aware of a Dr Bridgett Larsson in GI locally. Thanks.  If not, then he said we can refer to anyone else.

## 2014-07-03 ENCOUNTER — Ambulatory Visit (INDEPENDENT_AMBULATORY_CARE_PROVIDER_SITE_OTHER): Payer: BLUE CROSS/BLUE SHIELD | Admitting: Family Medicine

## 2014-07-03 VITALS — BP 115/80 | HR 100 | Temp 99.4°F | Resp 17 | Ht 67.5 in | Wt 174.4 lb

## 2014-07-03 DIAGNOSIS — J3489 Other specified disorders of nose and nasal sinuses: Secondary | ICD-10-CM | POA: Diagnosis not present

## 2014-07-03 DIAGNOSIS — J069 Acute upper respiratory infection, unspecified: Secondary | ICD-10-CM

## 2014-07-03 DIAGNOSIS — I889 Nonspecific lymphadenitis, unspecified: Secondary | ICD-10-CM | POA: Diagnosis not present

## 2014-07-03 MED ORDER — DOXYCYCLINE HYCLATE 100 MG PO TABS: 100.0000 mg | ORAL_TABLET | Freq: Two times a day (BID) | ORAL | Status: DC

## 2014-07-03 MED ORDER — VALACYCLOVIR HCL 1 G PO TABS: 1000.0000 mg | ORAL_TABLET | Freq: Two times a day (BID) | ORAL | Status: DC

## 2014-07-03 NOTE — Patient Instructions (Signed)
Take the anabiotic, doxycycline, 1 pill twice daily. Be cautious in direct sunlight that you might sunburn easier, so wear a cap.  Take the anti-viral medication, valacyclovir, one pill 3 times daily also for the infection of the nose  You can take something like Claritin-D or Zyrtec-D over-the-counter if necessary for the nose congestion and sniffling  Return if worse

## 2014-07-03 NOTE — Progress Notes (Signed)
  Subjective:  Patient ID: CHRISTY FRIEDE, male    DOB: [birth year]  Age: 64 y.o. MRN: [id number]  64 year old man with history of waking up this morning and noting a swollen tender not just below his left ear on the side of his neck. He also has had some infection that does for several days and has had a little sinus congestion. His wife did have fever blisters. He has not had them before.   Objective:   TMs are normal. Throat clear. Nose congested with a lot of sniffling. The nose itself on the left side of the tip past erythema and some early blistering. Got a tender 1.5 cm node just at the angle of the jaw on the left. No other nodes noted.  Assessment & Plan:   Assessment: Nasal cellulitis and cervical lymphadenitis and URI  Plan: Patient Instructions  Take the anabiotic, doxycycline, 1 pill twice daily. Be cautious in direct sunlight that you might sunburn easier, so wear a cap.  Take the anti-viral medication, valacyclovir, one pill 3 times daily also for the infection of the nose  You can take something like Claritin-D or Zyrtec-D over-the-counter if necessary for the nose congestion and sniffling  Return if worse     HOPPER,DAVID, MD 07/03/2014

## 2014-07-10 ENCOUNTER — Telehealth: Payer: Self-pay

## 2014-07-10 NOTE — Telephone Encounter (Signed)
Pt wants to talk to Dr Linna Darner

## 2014-07-10 NOTE — Telephone Encounter (Signed)
Spoke with pt, he was here last week and he had blisters on his nose. He was given antibiotics but wants to know if he can put something else on the blisters. Please advise.

## 2014-07-11 NOTE — Telephone Encounter (Signed)
Patient returned call.  Please call again.   [phone]

## 2014-07-11 NOTE — Telephone Encounter (Signed)
lmom to cb. 

## 2014-07-11 NOTE — Telephone Encounter (Signed)
If the meds have not helped at all he should return for a recheck.  He can put on OTC polysporin ointment 1-2 times daily, but should recheck if not improving or if worse.

## 2014-07-12 NOTE — Telephone Encounter (Signed)
lmom to cb. 

## 2014-07-13 NOTE — Telephone Encounter (Signed)
Patient is going to try the polysporin for a couple of days  and if that doesn't work, he will return to the clinic.

## 2014-07-14 ENCOUNTER — Ambulatory Visit (INDEPENDENT_AMBULATORY_CARE_PROVIDER_SITE_OTHER): Payer: BLUE CROSS/BLUE SHIELD | Admitting: Family Medicine

## 2014-07-14 VITALS — BP 138/88 | HR 102 | Temp 98.3°F | Resp 17 | Ht 67.5 in | Wt 175.0 lb

## 2014-07-14 DIAGNOSIS — J3489 Other specified disorders of nose and nasal sinuses: Secondary | ICD-10-CM

## 2014-07-14 DIAGNOSIS — J309 Allergic rhinitis, unspecified: Secondary | ICD-10-CM | POA: Diagnosis not present

## 2014-07-14 MED ORDER — AMOXICILLIN-POT CLAVULANATE 875-125 MG PO TABS: 1.0000 | ORAL_TABLET | Freq: Two times a day (BID) | ORAL | Status: DC

## 2014-07-14 NOTE — Telephone Encounter (Signed)
noted 

## 2014-07-14 NOTE — Patient Instructions (Signed)
Continue your regular medications  Take Augmentin 875 mg 1 twice daily for infection (amoxicillin/clavulanate)  Use your Nasacort spray 2 sprays each nostril daily for the allergies  In addition to the Nasacort I would recommend taking some fexofenadine (Allegra) 1 daily. It does not interact with most of the medications and should be tolerated well.  If the lesion on your nose does not clear on up may need to send you on to a dermatologist to get it looked at, but it is not as bad as it was last visit

## 2014-07-14 NOTE — Progress Notes (Signed)
  Subjective:  Patient ID: Elijah Marshall, male    DOB: [birth year]  Age: 64 y.o. MRN: [id number]  64 year old man here for a recheck with regard to an infection on his nose. I saw him a couple of weeks ago and he had a almost blistered infection on the left side of the bridge of his nose which is very tender. He had cervical nodes that were tender. He took the course of doxycycline and of valacyclovir but the place on the nose is not completely gone away. He continues to have some discomfort there. No drainage. He has had allergic rhinitis and continues no flow a lot and wants something for that. He has a history of COPD that is stable. He is currently waiting to get everything else taking care of well enough so that he can have a total hip replacement. He has had a previous total knee. He also is a 2 back surgeries. He is not acutely ill with this place on his face, but it continues to not resolve. He wants to get everything else take care of before he has surgeries.   Objective:   No major distress. Slightly he hyper pigmented area on the left side of the bridge of the nose about 1 x 2 cm. This has a mild folliculitis appearance to it. I was able to squeeze a tiny bit of pus out of one of the pores, which was cultured. TMs are normal. Nose itself does not look particularly inflamed though he keeps sniffling. His throat is clear. Neck supple without significant nodes. The chest is fairly clear to auscultation, slightly coarse on the right compared to the left. Heart regular without murmurs.   Assessment & Plan:   Assessment: Infection of skin of nose, persistent Allergic rhinitis History of COPD  Plan:  Will treat with a round of Anaprox and if he doesn't improve almost completely this time will probably want to get a dermatologist look at this area. I do not see anything that needs biopsies at this time. I'm reluctant to do any I&D of the nose, and it is not fluctuant so should not be  necessary. Patient Instructions  Continue your regular medications  Take Augmentin 875 mg 1 twice daily for infection (amoxicillin/clavulanate)  Use your Nasacort spray 2 sprays each nostril daily for the allergies  In addition to the Nasacort I would recommend taking some fexofenadine (Allegra) 1 daily. It does not interact with most of the medications and should be tolerated well.  If the lesion on your nose does not clear on up may need to send you on to a dermatologist to get it looked at, but it is not as bad as it was last visit    HOPPER,DAVID, MD 07/14/2014

## 2014-07-17 LAB — WOUND CULTURE
Gram Stain: NONE SEEN
Gram Stain: NONE SEEN
Gram Stain: NONE SEEN
Organism ID, Bacteria: NO GROWTH

## 2014-07-27 ENCOUNTER — Telehealth: Payer: Self-pay

## 2014-07-27 NOTE — Telephone Encounter (Signed)
Patient called to inquire about his lab results.  He also wanted to speak to someone about his previous visit on 07/14/14.  He said his nose lesion has not improved; he said it has "turned black."  He would like a recommendation for further treatment.  He was wondering if he needed another antibiotic called in.  CB#: [phone] (mobile)

## 2014-08-03 NOTE — Telephone Encounter (Signed)
Spoke with pt, advised message from Dr. Linna Darner. Pt understood and states his nose is better.

## 2014-08-03 NOTE — Telephone Encounter (Signed)
Needs rechecked if still having problems.

## 2014-10-20 ENCOUNTER — Ambulatory Visit (INDEPENDENT_AMBULATORY_CARE_PROVIDER_SITE_OTHER): Payer: BLUE CROSS/BLUE SHIELD | Admitting: Family Medicine

## 2014-10-20 VITALS — BP 136/70 | HR 110 | Temp 98.1°F | Resp 20 | Ht 67.5 in | Wt 174.4 lb

## 2014-10-20 DIAGNOSIS — J0120 Acute ethmoidal sinusitis, unspecified: Secondary | ICD-10-CM

## 2014-10-20 MED ORDER — TRIAMCINOLONE ACETONIDE 55 MCG/ACT NA AERO: 2.0000 | INHALATION_SPRAY | Freq: Every day | NASAL | Status: DC

## 2014-10-20 MED ORDER — PREDNISONE 20 MG PO TABS: ORAL_TABLET | ORAL | Status: DC

## 2014-10-20 MED ORDER — ALBUTEROL SULFATE 108 (90 BASE) MCG/ACT IN AEPB: 2.0000 | INHALATION_SPRAY | RESPIRATORY_TRACT | Status: DC | PRN

## 2014-10-20 MED ORDER — CEFDINIR 300 MG PO CAPS: 600.0000 mg | ORAL_CAPSULE | Freq: Every day | ORAL | Status: DC

## 2014-10-20 MED ORDER — LEVOCETIRIZINE DIHYDROCHLORIDE 5 MG PO TABS: 5.0000 mg | ORAL_TABLET | Freq: Every evening | ORAL | Status: DC

## 2014-10-20 NOTE — Patient Instructions (Signed)
Take 2 tabs of omnicef tonight.  THen tomorrow Wed morning take 1 tab of the omnicef and 3 tabs of the prednisone and take 1 tab of the omnicef with dinner.  Then Thursday morning take 2 tabs of the omnicef and 3 tabs of the prednisone. Continue to take both all together every morning and they will both end on Fri 23.  Sinusitis Sinusitis is redness, soreness, and inflammation of the paranasal sinuses. Paranasal sinuses are air pockets within the bones of your face (beneath the eyes, the middle of the forehead, or above the eyes). In healthy paranasal sinuses, mucus is able to drain out, and air is able to circulate through them by way of your nose. However, when your paranasal sinuses are inflamed, mucus and air can become trapped. This can allow bacteria and other germs to grow and cause infection. Sinusitis can develop quickly and last only a short time (acute) or continue over a long period (chronic). Sinusitis that lasts for more than 12 weeks is considered chronic.  CAUSES  Causes of sinusitis include:  Allergies.  Structural abnormalities, such as displacement of the cartilage that separates your nostrils (deviated septum), which can decrease the air flow through your nose and sinuses and affect sinus drainage.  Functional abnormalities, such as when the small hairs (cilia) that line your sinuses and help remove mucus do not work properly or are not present. SIGNS AND SYMPTOMS  Symptoms of acute and chronic sinusitis are the same. The primary symptoms are pain and pressure around the affected sinuses. Other symptoms include:  Upper toothache.  Earache.  Headache.  Bad breath.  Decreased sense of smell and taste.  A cough, which worsens when you are lying flat.  Fatigue.  Fever.  Thick drainage from your nose, which often is green and may contain pus (purulent).  Swelling and warmth over the affected sinuses. DIAGNOSIS  Your health care provider will perform a physical exam.  During the exam, your health care provider may:  Look in your nose for signs of abnormal growths in your nostrils (nasal polyps).  Tap over the affected sinus to check for signs of infection.  View the inside of your sinuses (endoscopy) using an imaging device that has a light attached (endoscope). If your health care provider suspects that you have chronic sinusitis, one or more of the following tests may be recommended:  Allergy tests.  Nasal culture. A sample of mucus is taken from your nose, sent to a lab, and screened for bacteria.  Nasal cytology. A sample of mucus is taken from your nose and examined by your health care provider to determine if your sinusitis is related to an allergy. TREATMENT  Most cases of acute sinusitis are related to a viral infection and will resolve on their own within 10 days. Sometimes medicines are prescribed to help relieve symptoms (pain medicine, decongestants, nasal steroid sprays, or saline sprays).  However, for sinusitis related to a bacterial infection, your health care provider will prescribe antibiotic medicines. These are medicines that will help kill the bacteria causing the infection.  Rarely, sinusitis is caused by a fungal infection. In theses cases, your health care provider will prescribe antifungal medicine. For some cases of chronic sinusitis, surgery is needed. Generally, these are cases in which sinusitis recurs more than 3 times per year, despite other treatments. HOME CARE INSTRUCTIONS   Drink plenty of water. Water helps thin the mucus so your sinuses can drain more easily.  Use a humidifier.  Inhale steam 3 to 4 times a day (for example, sit in the bathroom with the shower running).  Apply a warm, moist washcloth to your face 3 to 4 times a day, or as directed by your health care provider.  Use saline nasal sprays to help moisten and clean your sinuses.  Take medicines only as directed by your health care provider.  If you  were prescribed either an antibiotic or antifungal medicine, finish it all even if you start to feel better. SEEK IMMEDIATE MEDICAL CARE IF:  You have increasing pain or severe headaches.  You have nausea, vomiting, or drowsiness.  You have swelling around your face.  You have vision problems.  You have a stiff neck.  You have difficulty breathing. MAKE SURE YOU:   Understand these instructions.  Will watch your condition.  Will get help right away if you are not doing well or get worse. Document Released: 01/23/2005 Document Revised: 06/09/2013 Document Reviewed: 02/07/2011 [facility] Patient Information 2015 [place], [place]. This information is not intended to replace advice given to you by your health care provider. Make sure you discuss any questions you have with your health care provider.

## 2014-10-20 NOTE — Progress Notes (Signed)
Subjective:  This chart was scribed for [name] MD, by Tamsen Roers, at [facility].  This patient was seen in room 12  and the patient's care was started at 3:19 PM.    Patient ID: Elijah Marshall, male    DOB: 10-19-1950, 64 y.o.   MRN: [id number] Chief Complaint  Patient presents with  . Sinusitis    sinus pressure, sinus pain, ears feel full, eyes feel heavy, night sweats  . Chest Pain    chest discomfort.  wants his heart checked today    HPI  HPI Comments: Elijah Marshall is a 64 y.o. male who presents to the [facility] complaining of sinus pain and pressure onset two weeks ago.  He has associated symptoms of night sweats(1-2 weeks), ear pressure/ringing, chest pain and is having difficulty getting the congestion out by coughing.  He has used mucin ex and sudafed but denies any relief. He uses Atrovent (which he started a couple weeks ago) and thinks it may have caused his symptoms.   Patient has been trying to lose weight and states he has not had any weight gain.  He stopped taking his over the counter antihistamines (Allegra) when he felt like they were not helping his symptoms. He is using 160 dose Symbicort.    -Patient would like a refill for his inhaler.    -He states that he is not having as many issues with digestion anymore and went to see a gastroenterologist who gave him Dexilant as well as a list of things he can/ cannot drink.  He states he has not been in any pain since.     -Patient notes that his nasal sore cleared up but he still has some discoloration.  He denies any tenderness to it. ----- Elijah Marshall was in several months ago with a nasal infection treated with doxycyline and valtrex.  He does have chronic allergic rhinitis and history of frequent sinus infection ans well as COPD followed by Dr. Melvyn Novas.  He is gearing up for a total hip replacement.    Past Medical History  Diagnosis Date  . Allergy   . Ulcer     . COPD (chronic obstructive pulmonary disease)     per patient's health survey - he put a ?  . GERD (gastroesophageal reflux disease)   . Arthritis   . Shortness of breath     Current Outpatient Prescriptions on File Prior to Visit  Medication Sig Dispense Refill  . albuterol (PROVENTIL HFA;VENTOLIN HFA) 108 (90 BASE) MCG/ACT inhaler Inhale 2 puffs into the lungs every 6 (six) hours as needed for wheezing or shortness of breath.    . budesonide-formoterol (SYMBICORT) 160-4.5 MCG/ACT inhaler Take 2 puffs first thing in am and then another 2 puffs about 12 hours later.    . cyclobenzaprine (FLEXERIL) 10 MG tablet Take 10 mg by mouth 3 (three) times daily as needed for muscle spasms.    Marland Kitchen dexlansoprazole (DEXILANT) 60 MG capsule Take 1 capsule (60 mg total) by mouth daily. 30 capsule 12  . diazepam (VALIUM) 5 MG tablet Take 5 mg by mouth every 6 (six) hours as needed for anxiety.    Marland Kitchen HYDROcodone-acetaminophen (NORCO/VICODIN) 5-325 MG per tablet Take 1 tablet by mouth as needed.  0  . Phenylephrine-APAP-Guaifenesin (MUCINEX FAST-MAX COLD & SINUS PO) Take 1 tablet by mouth 2 (two) times daily.    . polyethylene glycol (MIRALAX / GLYCOLAX) packet Take 17 g  by mouth daily as needed for mild constipation.    . ranitidine (ZANTAC) 150 MG tablet Take 150 mg by mouth 2 (two) times daily as needed for heartburn.    . traMADol (ULTRAM) 50 MG tablet Take 50 mg by mouth every 6 (six) hours as needed for moderate pain.    Marland Kitchen amoxicillin-clavulanate (AUGMENTIN) 875-125 MG per tablet Take 1 tablet by mouth 2 (two) times daily. (Patient not taking: Reported on 10/20/2014) 20 tablet 0  . budesonide-formoterol (SYMBICORT) 80-4.5 MCG/ACT inhaler Take 2 puffs first thing in am and then another 2 puffs about 12 hours later. (Patient not taking: Reported on 10/20/2014) 1 Inhaler 11  . doxycycline (VIBRA-TABS) 100 MG tablet Take 1 tablet (100 mg total) by mouth 2 (two) times daily. (Patient not taking: Reported on  07/14/2014) 20 tablet 0  . sucralfate (CARAFATE) 1 G tablet 1 tablet 1 hr ac and hs (Patient not taking: Reported on 10/20/2014) 120 tablet 0  . valACYclovir (VALTREX) 1000 MG tablet Take 1 tablet (1,000 mg total) by mouth 2 (two) times daily. (Patient not taking: Reported on 10/20/2014) 21 tablet 0   No current facility-administered medications on file prior to visit.    Allergies  Allergen Reactions  . Aspirin Nausea And Vomiting  . Morphine And Related Itching       Review of Systems  Constitutional: Negative for fever, chills and unexpected weight change.  HENT: Positive for congestion, ear pain, postnasal drip and sinus pressure.   Eyes: Negative for pain, discharge, redness and itching.  Respiratory: Positive for cough.   Cardiovascular: Positive for chest pain.  Gastrointestinal: Negative for nausea and vomiting.  Musculoskeletal: Negative for neck pain and neck stiffness.  Neurological: Negative for syncope and speech difficulty.       Objective:   Physical Exam  Constitutional: He appears well-developed and well-nourished. No distress.  HENT:  Head: Normocephalic and atraumatic.  Left TM - mid ear effusion, erythematous.   Right TM with mid ear effusion.   Nares pale and boggy, right worse than left.   oropharynx with a lot of erythema and postnasal drip.   Neck: Normal range of motion. Neck supple. No thyromegaly present.  No supraclavicular adenopathy.   Cardiovascular: Normal rate, regular rhythm, S1 normal, S2 normal and normal heart sounds.  Exam reveals no friction rub.   No murmur heard. Pulmonary/Chest: Breath sounds normal. No respiratory distress. He has no wheezes. He has no rales.  Good air movement. Lungs are clear.   Lymphadenopathy:    He has no cervical adenopathy.    Filed Vitals:   10/20/14 1447  BP: 136/70  Pulse: 110  Temp: 98.1 F (36.7 C)  TempSrc: Oral  Resp: 20  Height: 5' 7.5" (1.715 m)  Weight: 174 lb 6 oz (79.096 kg)  SpO2: 97%          Assessment & Plan:  He has failed allegra Atrovent nasal spray and zyrtec.  He is currently on Nasacort.    1. Acute ethmoidal sinusitis, recurrence not specified     Meds ordered this encounter  Medications  . DISCONTD: triamcinolone (NASACORT AQ) 55 MCG/ACT AERO nasal inhaler    Sig: Place 2 sprays into the nose daily.    Dispense:  1 Inhaler    Refill:  12  . levocetirizine (XYZAL) 5 MG tablet    Sig: Take 1 tablet (5 mg total) by mouth every evening.    Dispense:  30 tablet    Refill:  11  .  predniSONE (DELTASONE) 20 MG tablet    Sig: Take 3 tabs po qd x 3d, 2 tabs po qd x 3d,1 tab po qd x 3d    Dispense:  18 tablet    Refill:  0  . cefdinir (OMNICEF) 300 MG capsule    Sig: Take 2 capsules (600 mg total) by mouth daily.    Dispense:  20 capsule    Refill:  0  . Albuterol Sulfate (PROAIR RESPICLICK) 224 (90 BASE) MCG/ACT AEPB    Sig: Inhale 2 puffs into the lungs every 4 (four) hours as needed.    Dispense:  1 each    Refill:  11    I personally performed the services described in this documentation, which was scribed in my presence. The recorded information has been reviewed and considered, and addended by me as needed.  [name], MD MPH

## 2014-11-02 ENCOUNTER — Telehealth: Payer: Self-pay

## 2014-11-02 NOTE — Telephone Encounter (Signed)
Spoke with pt, advised to RTC to see Dr. Brigitte Pulse. He was last seen on 9/13 and needs follow up for additional medications. Pt understood.

## 2014-11-02 NOTE — Telephone Encounter (Signed)
Pt states his symptoms haven't completely cleared up and would like to have something else called in. Please call [phone] and his house number is [phone]     [facility]

## 2015-01-15 ENCOUNTER — Ambulatory Visit (INDEPENDENT_AMBULATORY_CARE_PROVIDER_SITE_OTHER): Payer: BLUE CROSS/BLUE SHIELD | Admitting: Family Medicine

## 2015-01-15 VITALS — BP 142/80 | HR 108 | Temp 98.3°F | Resp 16 | Ht 68.0 in | Wt 174.4 lb

## 2015-01-15 DIAGNOSIS — Z23 Encounter for immunization: Secondary | ICD-10-CM | POA: Diagnosis not present

## 2015-01-15 DIAGNOSIS — J449 Chronic obstructive pulmonary disease, unspecified: Secondary | ICD-10-CM | POA: Diagnosis not present

## 2015-01-15 DIAGNOSIS — J0101 Acute recurrent maxillary sinusitis: Secondary | ICD-10-CM

## 2015-01-15 MED ORDER — IPRATROPIUM BROMIDE 0.02 % IN SOLN
0.5000 mg | Freq: Once | RESPIRATORY_TRACT | Status: AC
  Administered 2015-01-15: 0.5 mg via RESPIRATORY_TRACT

## 2015-01-15 MED ORDER — HYDROCOD POLST-CPM POLST ER 10-8 MG/5ML PO SUER: 5.0000 mL | Freq: Two times a day (BID) | ORAL | Status: DC | PRN

## 2015-01-15 MED ORDER — LEVOFLOXACIN 500 MG PO TABS: 500.0000 mg | ORAL_TABLET | Freq: Every day | ORAL | Status: DC

## 2015-01-15 MED ORDER — PREDNISONE 20 MG PO TABS: ORAL_TABLET | ORAL | Status: DC

## 2015-01-15 MED ORDER — ALBUTEROL SULFATE (2.5 MG/3ML) 0.083% IN NEBU
2.5000 mg | INHALATION_SOLUTION | Freq: Once | RESPIRATORY_TRACT | Status: AC
  Administered 2015-01-15: 2.5 mg via RESPIRATORY_TRACT

## 2015-01-15 NOTE — Patient Instructions (Signed)

## 2015-01-15 NOTE — Progress Notes (Addendum)
Subjective:  This chart was scribed for Elijah Cheadle, MD by [name], Medical Scribe. This patient was seen in Room 5 and the patient's care was started 10:39 AM.   Patient ID: Elijah Marshall, male    DOB: 1950-08-27, 64 y.o.   MRN: [id number] Chief Complaint  Patient presents with  . Headache  . Nasal Congestion  . Cough    Patient states he can not get the phlegm up, History of copd  . Immunizations    flu vaccine    HPI Elijah Marshall is a 64 y.o. male who has h/o copd presents to [facility] complaining of gradual onset headache with nasal congestion and cough, started 5 days ago. He has reoccurring sinusitis, followed by Dr. Melvyn Novas. Saw patient last 3 months ago and was treated with omnicef and prednisone. He failed allegra, zyrtec and atrivent. He continues on nasal spray.   Illness He noticed some sinus pressure with teeth pain. He notes wheezing at night. He also mentions left chest soreness after the coughs. He denies chest soreness prior to the coughs. He's been using his ProAir when needed. He uses symbicort twice a day. He denies fever and chills. He denies appetite loss.   Immunizations He also requested to have a flu shot.   Past Medical History  Diagnosis Date  . Allergy   . Ulcer   . COPD (chronic obstructive pulmonary disease) ([place])     per patient's health survey - he put a ?  . GERD (gastroesophageal reflux disease)   . Arthritis   . Shortness of breath    Prior to Admission medications   Medication Sig Start Date End Date Taking? Authorizing Provider  albuterol (PROVENTIL HFA;VENTOLIN HFA) 108 (90 BASE) MCG/ACT inhaler Inhale 2 puffs into the lungs every 6 (six) hours as needed for wheezing or shortness of breath.   Yes Historical Provider, MD  Albuterol Sulfate (PROAIR RESPICLICK) 342 (90 BASE) MCG/ACT AEPB Inhale 2 puffs into the lungs every 4 (four) hours as needed. 10/20/14  Yes [name], MD  budesonide-formoterol [facility]) 160-4.5 MCG/ACT inhaler Take 2  puffs first thing in am and then another 2 puffs about 12 hours later. 01/28/14  Yes [name], MD  cyclobenzaprine (FLEXERIL) 10 MG tablet Take 10 mg by mouth 3 (three) times daily as needed for muscle spasms.   Yes Historical Provider, MD  dexlansoprazole (DEXILANT) 60 MG capsule Take 1 capsule (60 mg total) by mouth daily. 06/08/14  Yes Roselee Culver, MD  diazepam (VALIUM) 5 MG tablet Take 5 mg by mouth every 6 (six) hours as needed for anxiety.   Yes Historical Provider, MD  HYDROcodone-acetaminophen (NORCO/VICODIN) 5-325 MG per tablet Take 1 tablet by mouth as needed. 12/05/13  Yes Historical Provider, MD  levocetirizine (XYZAL) 5 MG tablet Take 1 tablet (5 mg total) by mouth every evening. 10/20/14  Yes [name], MD  polyethylene glycol [facility] / GLYCOLAX) packet Take 17 g by mouth daily as needed for mild constipation.   Yes Historical Provider, MD  budesonide-formoterol (SYMBICORT) 80-4.5 MCG/ACT inhaler Take 2 puffs first thing in am and then another 2 puffs about 12 hours later. Patient not taking: Reported on 10/20/2014 02/25/14   [name], MD  cefdinir (OMNICEF) 300 MG capsule Take 2 capsules (600 mg total) by mouth daily. Patient not taking: Reported on 01/15/2015 10/20/14   [name], MD  predniSONE (DELTASONE) 20 MG tablet Take 3 tabs po qd x 3d,  2 tabs po qd x 3d,1 tab po qd x 3d Patient not taking: Reported on 01/15/2015 10/20/14   [name], MD  traMADol (ULTRAM) 50 MG tablet Take 50 mg by mouth every 6 (six) hours as needed for moderate pain.    Historical Provider, MD   Allergies  Allergen Reactions  . Aspirin Nausea And Vomiting  . Morphine And Related Itching    Review of Systems  Constitutional: Negative for fever, chills and fatigue.  HENT: Positive for congestion, postnasal drip and sinus pressure. Negative for rhinorrhea and sore throat.   Respiratory: Positive for cough, chest tightness and wheezing. Negative for shortness of breath.   Cardiovascular:  Negative for chest pain.  Neurological: Positive for headaches.       Objective:   Physical Exam  Constitutional: He is oriented to person, place, and time. He appears well-developed and well-nourished. No distress.  HENT:  Head: Normocephalic and atraumatic.  Right Ear: Tympanic membrane normal.  Left Ear: Tympanic membrane is erythematous.  Nose: Nose normal.  Mouth/Throat: Posterior oropharyngeal erythema present.  Eyes: EOM are normal. Pupils are equal, round, and reactive to light.  Neck: Neck supple. No thyromegaly present.  Cardiovascular: Regular rhythm, S1 normal and S2 normal.  Tachycardia present.   Pulmonary/Chest: Effort normal. No respiratory distress. He has decreased breath sounds (in the bases).  Musculoskeletal: Normal range of motion.  Lymphadenopathy:       Head (right side): No submandibular adenopathy present.       Head (left side): No submandibular adenopathy present.       Right: No supraclavicular adenopathy present.       Left: No supraclavicular adenopathy present.  Neurological: He is alert and oriented to person, place, and time.  Skin: Skin is warm and dry.  Psychiatric: He has a normal mood and affect. His behavior is normal.  Nursing note and vitals reviewed.   BP 142/80 mmHg  Pulse 108  Temp(Src) 98.3 F (36.8 C) (Oral)  Resp 16  Ht '5\' 8"'$  (1.727 m)  Wt 174 lb 6.4 oz (79.107 kg)  BMI 26.52 kg/m2  SpO2 96%     Assessment & Plan:   1. COPD GOLD II    2. Acute recurrent maxillary sinusitis   3. Needs flu shot - pt was given flu vaccine today prior to my evaluation. As he needs a course of prednisone for his sinusitis/copd exac that will blunt his immune response and render vaccine likely less effective so recommend that he RTC in 1-2 wks after completely prednisone course for repeat flu shot at no charge to him.    Orders Placed This Encounter  Procedures  . Flu Vaccine QUAD 36+ mos IM    Meds ordered this encounter  Medications  .  predniSONE (DELTASONE) 20 MG tablet    Sig: Take 3 tabs po qd x 3d, 2 tabs po qd x 3d,1 tab po qd x 3d    Dispense:  18 tablet    Refill:  0  . albuterol (PROVENTIL) (2.5 MG/3ML) 0.083% nebulizer solution 2.5 mg    Sig:   . ipratropium (ATROVENT) nebulizer solution 0.5 mg    Sig:   . chlorpheniramine-HYDROcodone (TUSSIONEX PENNKINETIC ER) 10-8 MG/5ML SUER    Sig: Take 5 mLs by mouth every 12 (twelve) hours as needed for cough.    Dispense:  120 mL    Refill:  0  . levofloxacin (LEVAQUIN) 500 MG tablet    Sig: Take 1 tablet (500 mg  total) by mouth daily.    Dispense:  7 tablet    Refill:  0    I personally performed the services described in this documentation, which was scribed in my presence. The recorded information has been reviewed and considered, and addended by me as needed.  Elijah Cheadle, MD MPH    By signing my name below, I, [name], attest that this documentation has been prepared under the direction and in the presence of Elijah Cheadle, MD. Electronically Signed: [name], [place]. 01/15/2015 , 10:39 AM .

## 2015-01-24 ENCOUNTER — Telehealth: Payer: Self-pay

## 2015-01-24 DIAGNOSIS — R059 Cough, unspecified: Secondary | ICD-10-CM

## 2015-01-24 DIAGNOSIS — R05 Cough: Secondary | ICD-10-CM

## 2015-01-24 NOTE — Telephone Encounter (Signed)
Pt states he is still coughing up phlem/ would like rx (for cough) to be renew/extended since symptoms are improving but not eliminated.  [facility] [address]  [phone]

## 2015-01-25 NOTE — Telephone Encounter (Signed)
Pt is well known to me and does get recurrent bronchitis/rad exc over the winter so fine with me to refill -pt will need a paper rx so please see if another provider will fill on my behalf, thanks.

## 2015-01-25 NOTE — Telephone Encounter (Signed)
Dr. Brigitte Pulse please advise.   chlorpheniramine-HYDROcodone [facility]) 10-8 MG/5ML Latanya Presser [[phone]

## 2015-01-26 ENCOUNTER — Other Ambulatory Visit: Payer: Self-pay | Admitting: Urgent Care

## 2015-01-26 DIAGNOSIS — R059 Cough, unspecified: Secondary | ICD-10-CM

## 2015-01-26 DIAGNOSIS — R05 Cough: Secondary | ICD-10-CM

## 2015-01-26 MED ORDER — HYDROCOD POLST-CPM POLST ER 10-8 MG/5ML PO SUER: 5.0000 mL | Freq: Every evening | ORAL | Status: DC | PRN

## 2015-01-26 NOTE — Telephone Encounter (Signed)
Filling for tussionex for his behalf.  If he has fever, sob/dyspnea, and purulent cough, he needs to return here.  Please alert that he will need to come here for prescription for pick up

## 2015-01-26 NOTE — Telephone Encounter (Signed)
Pt.notified

## 2015-01-26 NOTE — Progress Notes (Signed)
Reprinted script as requested by Dr. Brigitte Pulse and CMA Jonelle Sidle.

## 2015-03-09 ENCOUNTER — Other Ambulatory Visit: Payer: Self-pay | Admitting: Internal Medicine

## 2015-03-13 ENCOUNTER — Other Ambulatory Visit: Payer: Self-pay | Admitting: Internal Medicine

## 2015-03-16 ENCOUNTER — Other Ambulatory Visit: Payer: Self-pay | Admitting: Internal Medicine

## 2015-03-20 ENCOUNTER — Other Ambulatory Visit: Payer: Self-pay | Admitting: Internal Medicine

## 2015-03-23 ENCOUNTER — Other Ambulatory Visit: Payer: Self-pay | Admitting: Internal Medicine

## 2015-03-24 ENCOUNTER — Telehealth: Payer: Self-pay

## 2015-03-24 ENCOUNTER — Telehealth: Payer: Self-pay | Admitting: Internal Medicine

## 2015-03-24 MED ORDER — BUDESONIDE-FORMOTEROL FUMARATE 80-4.5 MCG/ACT IN AERO: 2.0000 | INHALATION_SPRAY | Freq: Two times a day (BID) | RESPIRATORY_TRACT | Status: DC

## 2015-03-24 NOTE — Telephone Encounter (Signed)
Patient states that he has he still has a sinus infection and needs more medication sent

## 2015-03-24 NOTE — Telephone Encounter (Signed)
Per 02/25/14 OV: Patient Instructions       Dexilant Take 30-60 min before first meal of the day and Zantac 150 mg at bedtime Symbicort 80 Take 2 puffs first thing in am and then another 2 puffs about 12 hours later.  Only use your albuterol (proventil) as a rescue medication to be used if you can't catch your breath by resting or doing a relaxed purse lip breathing pattern.   - The less you use it, the better it will work when you need it. - Ok to use up to 2 puffs  every 4 hours if you must but call for immediate appointment if use goes up over your usual need - Don't leave home without it !!  (think of it like the spare tire for your car)  Heartburn gaviscon liquid as needed > if not better you may need to see a GI doctor For stuffy nose > stop ipatropium and use nasal saline > let dr Brigitte Pulse refer you to ENT doctor if needed  Pulmonary follow up is as needed    ---   Called spoke with pt. He reports his PCP advised him to follow up with [place] for his symbicort refills. He scheduled appt to see MW on 2/20. He is on symb 80 2 puffs BID. I have sent this in. Nothing further needed

## 2015-03-27 MED ORDER — BUDESONIDE-FORMOTEROL FUMARATE 80-4.5 MCG/ACT IN AERO: 2.0000 | INHALATION_SPRAY | Freq: Two times a day (BID) | RESPIRATORY_TRACT | Status: DC

## 2015-03-27 NOTE — Telephone Encounter (Signed)
Last saw pt 2 mos ago so would recommend being seen if sxs have recurred/persisted.   I do see in a prior phone call to pulm that he needed a refill on symbicort and states we told him to f/u with pulm for this but that was not my intent - I am happy to refill his chronic meds whenever needed so sent refill of symbicort to pharmacy.

## 2015-03-27 NOTE — Telephone Encounter (Signed)
Dr. Brigitte Pulse  Please see previous message

## 2015-03-29 ENCOUNTER — Encounter: Payer: Self-pay | Admitting: Internal Medicine

## 2015-03-29 ENCOUNTER — Ambulatory Visit (INDEPENDENT_AMBULATORY_CARE_PROVIDER_SITE_OTHER): Payer: Managed Care, Other (non HMO) | Admitting: Internal Medicine

## 2015-03-29 ENCOUNTER — Ambulatory Visit (INDEPENDENT_AMBULATORY_CARE_PROVIDER_SITE_OTHER)
Admission: RE | Admit: 2015-03-29 | Discharge: 2015-03-29 | Disposition: A | Discharge status: Home / Self Care | Payer: Managed Care, Other (non HMO) | Source: Ambulatory Visit | Attending: Internal Medicine | Admitting: Internal Medicine

## 2015-03-29 VITALS — BP 140/76 | HR 100 | Ht 68.0 in | Wt 175.0 lb

## 2015-03-29 DIAGNOSIS — R911 Solitary pulmonary nodule: Secondary | ICD-10-CM

## 2015-03-29 DIAGNOSIS — J449 Chronic obstructive pulmonary disease, unspecified: Secondary | ICD-10-CM

## 2015-03-29 DIAGNOSIS — J310 Chronic rhinitis: Secondary | ICD-10-CM

## 2015-03-29 MED ORDER — AMOXICILLIN-POT CLAVULANATE 875-125 MG PO TABS
1.0000 | ORAL_TABLET | Freq: Two times a day (BID) | ORAL | Status: DC
Start: 2015-03-29 — End: 2015-04-15

## 2015-03-29 MED ORDER — PREDNISONE 10 MG PO TABS: ORAL_TABLET | ORAL | Status: DC

## 2015-03-29 NOTE — Patient Instructions (Addendum)
Augmentin 875 mg take one pill twice daily  X 10 days - take at breakfast and supper with large glass of water.  It would help reduce the usual side effects (diarrhea and yeast infections) if you ate cultured yogurt at lunch.   Prednisone 10 mg take  4 each am x 2 days,   2 each am x 2 days,  1 each am x 2 days and stop   If not completely better call [facility] at [phone] and request a sinus CT  Avoid all perfume/cologne   I emphasized that nasal steroids(like nasonex)  have no immediate benefit in terms of improving symptoms.  To help them reached the target tissue, the patient should use Afrin two puffs every 12 hours applied one min before using the nasal steroids.  Afrin should be stopped after no more than 5 days.  If the symptoms worsen, Afrin can be restarted after 5 days off of therapy to prevent rebound congestion from overuse of Afrin.  I also emphasized that in no way are nasal steroids a concern in terms of "addiction".   Please remember to go to the  x-ray department downstairs for your tests - we will call you with the results when they are available. Late add : needs CT chest/sinus

## 2015-03-29 NOTE — Progress Notes (Signed)
Subjective:    Patient ID: Elijah Marshall, male    DOB: [birth year]  MRN: [id number]   Brief patient profile:  65  yobm quit smoking 2013  and able work out at [employer] and did fine until hurt back 2011 then 2 back surgeries and knee surgery  and more noticeable doe  since then but also limited by back and referred to pulmonary clinic 09/05/2013 for ? Copd  With GOLD II criteria 10/29/13     History of Present Illness  09/05/2013 1st [facility] office visit/ Caiden Arteaga  Chief Complaint  Patient presents with  . Pulmonary Consult    Referred per Dr. Norberto Sorenson.  Pt c/o SOB for the past 3 yrs. He states that he was dxed with COPD back in 2012 or 2013.  He states that that he sometimes has trouble with breathing when walking up stairs and lifting things.    rx spiriva first, alb, dulera not as effective  No problem at rest or supine Assoc nasal congestion much better p shot (?depomedrol) and bad again x sev years corresponds to worse doe  Ex = walking one mile slower pace than wife,trouble with hills, worse in heat  Bad hb even on dexilant  Min dry cough  rec Stop spiriva  Start anoro two puffs off one click each am Zantac 150 mg one at  Bedtime Prednisone 10 mg take  4 each am x 2 days,   2 each am x 2 days,  1 each am x 2 days and stop  GERD diet    10/15/2013 f/u ov/Matteus Mcnelly re: copd GOLD II better on anoro, new c/o sinus congestion  Chief Complaint  Patient presents with  . Follow-up    Breathing is some better since the last visit. He c/o sinus congestion and HA at bedtime for the past 2 wks. He also c/o PND.   acute onset variably severe assoc with nasal congestion / mucus yellowish/ bilateral temp HA worse as day goes on and better in am/ occurred p upper incisor infection and already on clindamycin per dentist  Breathing better on anoro, no need for saba  rec Depomedrol 120 mg today  schedule sinus CT > neg Ok take advil cold and sinus for sinus /nasal congestion    10/29/2013  f/u ov/Namiah Dunnavant re:  GOLD II, no need for saba on anoro daily  Chief Complaint  Patient presents with  . Follow-up    Breathing is some better- has not use rescue inhaler since the last visit. He states that he is coughing up minimal yellow sputum and still c/o sinus pressure.   cough worse x sev weeks with nasal congestion not using singulair consistently at all with neg sinus ct noted.  advil cold and sinus helps some. Not limited by breathing from desired activities   rec  singulair trial daily > x one month no better so d/c'd    01/28/2014 acute ov/ maint on anoro with aecopd assoc with fare of rhinitis sp trip to er for palpitations ? From over use of saba  Chief Complaint  Patient presents with  . Follow-up    pt c/o cough with yellow mucus, he has been taking mucinex. Patient reports cough worse at night. Patient denies chest tightness and wheeze. He has chest soreness   chest soreness gen anteriorly duing coughing fits, much worse since first week in Dec, indolent onset peristent daily some better with saba despite poor hfa  rec Prednisone 10 mg  take  4 each am x 2 days,   2 each am x 2 days,  1 each am x 2 days and stop  Augmentin 875 mg take one pill twice daily  X 10 days - take at breakfast and supper with large glass of water.  It would help reduce the usual side effects (diarrhea and yeast infections) if you ate cultured yogurt at lunch.  Stop anoro Symbicort 160 Take 2 puffs first thing in am and then another 2 puffs about 12 hours later.  Spiriva respimat 2 pffs each am    02/11/2014 f/u ov/Batina Dougan re: ran out of both symb/ spiriva  Chief Complaint  Patient presents with  . Follow-up    Cough is some better since the last visit. He c/o ear pain, sinus pressure and HA for the past 2 days. He states that although the cough is better, he still coughs up minimal yellow sputum.   rec Plan A = automatic = symbicort 160 Take 2 puffs first thing in am and then another 2 puffs about 12  hours later and dexilant before bfast and zantac at bedtime Plan B = backup Only use your albuterol (proventil) as a rescue medication GERD diet   Please schedule a follow up office visit in 2  weeks, sooner if needed with all medications in meds    02/25/2014 f/u ov/Baldwin Racicot re: GOLD II / symbicort 160 2bid / no need for rescue at all  Chief Complaint  Patient presents with  . Follow-up    Pt states that his cough is some better. He c/o hoarsness for the past wk. His breathing is unchanged.    Main issue is hoarsenss and throat tickle nasal stuffiness/ advil cold and sinus best, no purulent nasal secretions  rec Dexilant Take 30-60 min before first meal of the day and Zantac 150 mg at bedtime Symbicort 80 Take 2 puffs first thing in am and then another 2 puffs about 12 hours later.  Only use your albuterol (proventil) as a rescue medication  Heartburn gaviscon liquid as needed > if not better you may need to see a GI doctor For stuffy nose > stop ipatropium and use nasal saline > let dr Clelia Croft refer you to ENT doctor if needed    03/29/2015  f/u ov/Antoneo Ghrist re:  COPD II/ on symbicort 80 2bid   maint rx  Chief Complaint  Patient presents with  . Follow-up    1 year rov copd- pt c/o worsening SOB laying down and with exertion.  Also c/o teeth pain, HA, sinus pressure, pnd, prod cough with white/yellow mucus.  denies CP, chest tightness.    worse sob x sev weeks assoc with nasal congestion not responding to nasal steroids. Doe = MMRC2 = can't walk a nl pace on a flat grade s sob      No obvious day to day or daytime variabilty or assoc  chest tightness, subjective wheeze overt reflux  symptoms. No unusual exp hx or h/o childhood pna/ asthma or knowledge of premature birth.   Also denies any obvious fluctuation of symptoms with weather or environmental changes or other aggravating or alleviating factors except as outlined above.  Current Medications, Allergies, Complete Past Medical History, Past  Surgical History, Family History, and Social History were reviewed in [employer] record.  ROS  The following are not active complaints unless bolded sore throat, dysphagia, dental problems, itching, sneezing,  nasal congestion or excess/ purulent secretions, ear ache,  fever, chills, sweats, unintended wt loss, pleuritic or exertional cp, hemoptysis,  orthopnea pnd or leg swelling, presyncope, palpitations, heartburn, abdominal pain, anorexia, nausea, vomiting, diarrhea  or change in bowel or urinary habits, change in stools or urine, dysuria,hematuria,  rash, arthralgias, visual complaints, headache, numbness weakness or ataxia or problems with walking or coordination,  change in mood/affect or memory.             Objective:   Physical Exam  amb bm nad constant sniffling / smells very heavily of cologne   10/15/2013         177 > 01/28/2014   179> 02/11/2014  181 > 02/25/2014 180 > 03/29/2015  175     09/05/13 178 lb (80.74 kg)  08/15/13 179 lb (81.194 kg)  05/15/13 176 lb (79.833 kg)      HEENT mild turbinate edema. No tenderness over over R max sinus. Oropharynx no thrush or excess pnd or cobblestoning.  No JVD or cervical adenopathy. Mild accessory muscle hypertrophy. Trachea midline, nl thryroid. Chest was hyperinflated by percussion with diminished breath sounds and mild exp time with trace end exp. Hoover sign positive at end inspiration. Regular rate and rhythm without murmur gallop or rub or increase P2 or edema.  Abd: no hsm, nl excursion. Ext warm without cyanosis or clubbing.     CXR PA and Lateral:   03/29/2015 :    I personally reviewed images and agree with radiology impression as follows:    1. No acute cardiopulmonary disease. 2. COPD. 3. 13 mm nodular opacity projecting in the right mid lung just above the minor fissure. Recommend follow-up chest CT with contrast for further assessment.     Assessment & Plan:

## 2015-03-30 DIAGNOSIS — R911 Solitary pulmonary nodule: Secondary | ICD-10-CM | POA: Insufficient documentation

## 2015-03-30 NOTE — Telephone Encounter (Signed)
Patient went to see Dr. Melvyn Novas on 03/29/15.  He was treated for his sinus infection at that time.

## 2015-03-30 NOTE — Assessment & Plan Note (Signed)
Not present on prev cxr's and not viz on lateral view but he is only 4 years out from smoking so still in a relatively high risk window  rec CT s contrast   Discussed in detail all the  indications, usual  risks and alternatives  relative to the benefits with patient who agrees to proceed with conservative f/u as outlined    I had an extended discussion with the patient reviewing all relevant studies completed to date and  lasting 15 to 20 minutes of a 25 minute visit    Each maintenance medication was reviewed in detail including most importantly the difference between maintenance and prns and under what circumstances the prns are to be triggered using an action plan format that is not reflected in the computer generated alphabetically organized AVS.    Please see instructions for details which were reviewed in writing and the patient given a copy highlighting the part that I personally wrote and discussed at today's ov.

## 2015-03-30 NOTE — Assessment & Plan Note (Signed)
-   Spirometry  03/01/12  FEV1  1.63 - Spirometry   09/05/13 FEV1  1.34 (46%) ratio 44  - PFTs   10/29/2013    FEV1    1.70 (62%) ratio 50% no better after ssaba and DLCO 48%  - 09/05/2013  Walked RA x 3 laps @ fast pace @  185 ft each stopped due to end of study, fast pace,sat 89% at end  - 01/06/14 gradual worsening on anoro plus increase need for saba  - 01/28/2014   > try spiriva respimat/ symbicort 160 2bid > changed to symbicort 160 2bid only on 01/28/14  - 02/25/2014 p extensive coaching HFA effectiveness =  90%   > changed to symbicort 80 2bid since hoarse on 160 and clear on exam   Adequate control on present rx, reviewed > no change in rx needed  = symbicort 80 2bid  I believe the reason his breathing is worse at this point is related to his rhinitis (see separate a/p)

## 2015-03-30 NOTE — Assessment & Plan Note (Signed)
Sinus ct 10/22/13  Normal paranasal sinuses. - rechallenge with singulair 10/29/2013 > d/c 01/28/2014 as not better    I emphasized that nasal steroids have no immediate benefit in terms of improving symptoms.  To help them reached the target tissue, the patient should use Afrin two puffs every 12 hours applied one min before using the nasal steroids.  Afrin should be stopped after no more than 5 days.  If the symptoms worsen, Afrin can be restarted after 5 days off of therapy to prevent rebound congestion from overuse of Afrin.  I also emphasized that in no way are nasal steroids a concern in terms of "addiction".   Try augmentin x 10 days then repeat the sinus CT

## 2015-04-02 ENCOUNTER — Other Ambulatory Visit: Payer: Self-pay | Admitting: Internal Medicine

## 2015-04-02 DIAGNOSIS — R911 Solitary pulmonary nodule: Secondary | ICD-10-CM

## 2015-04-02 DIAGNOSIS — J310 Chronic rhinitis: Secondary | ICD-10-CM

## 2015-04-02 DIAGNOSIS — J449 Chronic obstructive pulmonary disease, unspecified: Secondary | ICD-10-CM

## 2015-04-02 NOTE — Progress Notes (Signed)
Quick Note:  Spoke with pt and notified of results per Dr. Wert. Pt verbalized understanding and denied any questions. Orders sent to [facility] ______ 

## 2015-04-05 ENCOUNTER — Other Ambulatory Visit (INDEPENDENT_AMBULATORY_CARE_PROVIDER_SITE_OTHER): Payer: Managed Care, Other (non HMO)

## 2015-04-05 DIAGNOSIS — J449 Chronic obstructive pulmonary disease, unspecified: Secondary | ICD-10-CM

## 2015-04-05 LAB — BASIC METABOLIC PANEL
BUN: 6 mg/dL (ref 6–23)
CO2: 32 mEq/L (ref 19–32)
Calcium: 9.6 mg/dL (ref 8.4–10.5)
Chloride: 101 mEq/L (ref 96–112)
Creatinine, Ser: 0.98 mg/dL (ref 0.40–1.50)
GFR: 98.99 mL/min (ref >60.00)
Glucose, Bld: 81 mg/dL (ref 70–99)
Potassium: 3.8 mEq/L (ref 3.5–5.1)
Sodium: 139 mEq/L (ref 135–145)

## 2015-04-05 NOTE — Progress Notes (Signed)
Quick Note:  Spoke with pt and notified of results per Dr. Wert. Pt verbalized understanding and denied any questions.  ______ 

## 2015-04-06 ENCOUNTER — Telehealth: Payer: Self-pay | Admitting: Internal Medicine

## 2015-04-06 NOTE — Telephone Encounter (Signed)
Printed order and faxed to [facility]

## 2015-04-06 NOTE — Telephone Encounter (Signed)
refaxed info to [facility]

## 2015-04-06 NOTE — Telephone Encounter (Signed)
Spoke with [facility] regarding this as she faxed the ct order earlier.  Forwarding message to [place] to follow up on.

## 2015-04-09 ENCOUNTER — Other Ambulatory Visit: Payer: Managed Care, Other (non HMO)

## 2015-04-09 ENCOUNTER — Inpatient Hospital Stay: Admission: RE | Admit: 2015-04-09 | Payer: Managed Care, Other (non HMO) | Source: Ambulatory Visit

## 2015-04-14 NOTE — Patient Instructions (Signed)
[facility] is now offering annual lung cancer screening by low-dose CT scan.  This is covered for qualifying patients and your insurance will be checked before the procedure.  Call the lung cancer screening nurse navigators at [phone] to learn more about this and get scheduled.

## 2015-04-14 NOTE — Progress Notes (Addendum)
Subjective:    Patient ID: Elijah Marshall, male    DOB: Jul 26, 1950, 65 y.o.   MRN: [id number] Chief Complaint  Patient presents with  . Annual Exam  . Depression    HPI  Mr. Hlavac is a delightful 65 yo man here today for a complete physical. He has not had a full CPE in Epic prior.  Health Maintanence: Immunization - flu shot UTD, TDaP done 2012, pneumovax 23 done 2014 Colonoscopy done 06/2012  COPD followed by Dr. Melvyn Novas. His last visit was 2 wks prior at which point CXR showed new 13 mm nodule in right mid lung so has been referred for CT for further characterization.  Had it down at [facility] last week which is on [employer].  He is using the symbicort regularly twice a day and uses the albuterol very seldomly  Has not been watching BP but does avoid salt.  Has a little left chest tenderness.  He has to have his hip replaced which is limiting his exercise.  Really slowing down his walking. Followed by Dr. Mayer Camel and hoping to get scheduled in the next several months. Has also seen Dr. Jacelyn Grip  He sees urology once a year in April and has his prostate monitored then.  He was seeing Dr. Kellie Simmering at [facility] urology but he just retired so has to establish with a new.  Is continuing to have problems with recurrent sinusitis and he had a follow up CT scan of this doen by Dr. Melvyn Novas last wk on 3/3 as well.  He has not seen ENT prior.  He was lat treated with a 10d course of Augmentin and prednisone and he felt the best he had in a while which continued for 4-5d after completion until sxs began to recur and now is back to normal h orrible sxs.   Having left ear pressure and popping, and a lot of chest congstion which he is having trouble getting up.  He was on mucinex DM but stopped after antibiotics.  Having hoarse voice.  He is using Afrin daily for the past sev wks upon Dr. Morrison Old instructions which he misunderstood.  GI is Dr. Benson Norway.  He is dexilant   Past Medical History  Diagnosis Date  .  Allergy   . Ulcer   . COPD (chronic obstructive pulmonary disease) ([place])     per patient's health survey - he put a ?  . GERD (gastroesophageal reflux disease)   . Arthritis   . Shortness of breath    Past Surgical History  Procedure Laterality Date  . Spine surgery  2012  . Colonoscopy  14    polyps rem  . Joint replacement Right 2010  . Anterior fusion lumbar spine  07/25/2012  . Anterior lat lumbar fusion Left 07/25/2012    Procedure: ANTERIOR LATERAL LUMBAR FUSION 1 LEVEL;  Surgeon: [name], MD;  Location: [place];  Service: Orthopedics;  Laterality: Left;  Left sided lumbar 3-4 lateral interbody fusion with allograft and instrumentation   Current Outpatient Prescriptions on File Prior to Visit  Medication Sig Dispense Refill  . Albuterol Sulfate (PROAIR RESPICLICK) 494 (90 BASE) MCG/ACT AEPB Inhale 2 puffs into the lungs every 4 (four) hours as needed. 1 each 11  . budesonide-formoterol (SYMBICORT) 80-4.5 MCG/ACT inhaler Inhale 2 puffs into the lungs 2 (two) times daily. 1 Inhaler 5  . dexlansoprazole (DEXILANT) 60 MG capsule Take 1 capsule (60 mg total) by mouth daily. 30 capsule 12   No  current facility-administered medications on file prior to visit.   Allergies  Allergen Reactions  . Aspirin Nausea And Vomiting  . Morphine And Related Itching   Family History  Problem Relation Age of Onset  . Cancer Mother     colon  . Heart disease Father   . Diabetes Sister   . Hypertension Sister   . Hypertension Brother   . Heart disease Maternal Grandmother     heart attack   Social History   Social History  . Marital Status: Married    Spouse Name: N/A  . Number of Children: N/A  . Years of Education: N/A   Occupational History  . truck driver-disability    Social History Main Topics  . Smoking status: Former Smoker -- 0.50 packs/day for 20 years    Types: Cigarettes    Quit date: 09/24/2011  . Smokeless tobacco: Never Used     Comment: occ beer  .  Alcohol Use: 0.0 oz/week    0 Standard drinks or equivalent per week     Comment: 2 beers a week  . Drug Use: No  . Sexual Activity: Yes    Birth Control/ Protection: None     Comment: number of sex partners in the last 14 months 1   Other Topics Concern  . None   Social History Narrative   Depression screen [facility] 2/9 04/15/2015 01/15/2015 10/20/2014 07/14/2014 07/03/2014  Decreased Interest 0 0 0 0 0  Down, Depressed, Hopeless 2 0 0 0 0  PHQ - 2 Score 2 0 0 0 0  Altered sleeping 2 - - - -  Tired, decreased energy 3 - - - -  Change in appetite 0 - - - -  Feeling bad or failure about yourself  3 - - - -  Trouble concentrating 0 - - - -  Moving slowly or fidgety/restless 1 - - - -  Suicidal thoughts 1 - - - -  PHQ-9 Score 12 - - - -      Review of Systems  Constitutional: Positive for activity change and fatigue. Negative for fever, chills, diaphoresis, appetite change and unexpected weight change.  HENT: Positive for congestion, ear pain, hearing loss, postnasal drip, rhinorrhea, sinus pressure, sneezing, sore throat and voice change. Negative for trouble swallowing.   Eyes: Negative for visual disturbance.  Respiratory: Positive for cough and shortness of breath. Negative for chest tightness and wheezing.   Cardiovascular: Positive for chest pain.  Musculoskeletal: Positive for back pain, arthralgias and gait problem. Negative for myalgias and joint swelling.  Skin: Negative for wound.  Psychiatric/Behavioral: Positive for suicidal ideas, confusion, sleep disturbance and dysphoric mood. Negative for hallucinations, behavioral problems, decreased concentration and agitation. The patient is nervous/anxious. The patient is not hyperactive.   All other systems reviewed and are negative.      Objective:  BP 151/95 mmHg  Pulse 96  Temp(Src) 97.8 F (36.6 C)  Resp 16  Ht '5\' 8"'$  (1.727 m)  Wt 174 lb (78.926 kg)  BMI 26.46 kg/m2  Physical Exam  Constitutional: He is oriented to  person, place, and time. He appears well-developed and well-nourished. No distress.  HENT:  Head: Normocephalic and atraumatic.  Right Ear: Tympanic membrane, external ear and ear canal normal.  Left Ear: Tympanic membrane, external ear and ear canal normal.  Nose: Mucosal edema and rhinorrhea present. Right sinus exhibits no maxillary sinus tenderness and no frontal sinus tenderness. Left sinus exhibits no maxillary sinus tenderness and no frontal sinus  tenderness.  Mouth/Throat: Uvula is midline, oropharynx is clear and moist and mucous membranes are normal. No oropharyngeal exudate.  freq sniffling throughout visit  Eyes: Conjunctivae are normal. Right eye exhibits no discharge. Left eye exhibits no discharge. No scleral icterus.  Neck: Normal range of motion. Neck supple. No thyromegaly present.  Cardiovascular: Normal rate, regular rhythm, normal heart sounds and intact distal pulses.   Pulmonary/Chest: Effort normal and breath sounds normal. No respiratory distress.  Abdominal: Soft. Bowel sounds are normal. He exhibits no distension and no mass. There is no tenderness. There is no rebound and no guarding.  Musculoskeletal: He exhibits no edema.  Lymphadenopathy:    He has no cervical adenopathy.  Neurological: He is alert and oriented to person, place, and time. He has normal reflexes. No cranial nerve deficit. He exhibits normal muscle tone.  Skin: Skin is warm and dry. No rash noted. He is not diaphoretic. No erythema.  Psychiatric: His speech is normal. Judgment and thought content normal. He is withdrawn. Cognition and memory are normal. He exhibits a depressed mood.      Assessment & Plan:    1. Annual physical exam - will need prevnar and AAA screen NEXT YR  2. Routine screening for STI (sexually transmitted infection)   3. Screening for cardiovascular, respiratory, and genitourinary diseases   4. Screening for colorectal cancer   5. Screening for deficiency anemia   6.  Screening for prostate cancer - sees urology annually for this  7. Screening for thyroid disorder   8. Encounter for screening for cancer of respiratory organs   9. History of tobacco abuse   10. Chronic sinusitis, unspecified location - needs ENT referral as rhinitis and cong sxs are very bothersome to him but just had second CT done that did not demonstrate any sinus pathology that would responsible (although during CT scan pt was on Augmentin and prednisone and sinus sxs were the best they had been in a while).  Holding off on ENT referral until after oncology w/u - cont xyzal, nasocort, singulair, and mucinex. Needs to stop Afrin - he was only supposed to have used this for 5d prior to nasocort in order to make the latter more effective fasting.  11. Solitary pulmonary nodule on lung CT - 32m spiculated mass newly seen on CXR [place] for [place] - fortunately no lymph nodes appear changed.  Stat referral to oncology and for PET scan per radiology recommendations on ct report.    12.    Hyperlipidemia - new diagnosis today, LDL 171. Will likely need to start on statin but holding off on any new medicine for now until after oncology c/s and work-up.  ADDENDUM: ASCVD risk 12.5% with baseline of 7% -> start pravastatin 40 13.      COPD - follows closely with Dr. WMelvyn Novas CT scan demonstrated progressive disease. 14.     Mood d/o - due to anx/depression over possible lung cancer diagnosis, has used valium prn prev- try klonopin instead while awaiting oncology w/u 15.     Chronic MSK pain - followed by ortho and expecting to get hip replaced this yr. Using rare prn hydrocodone which I refilled. Cautioned against taking bzd and opioid together - separate by at least 2 yrs.  Orders Placed This Encounter  Procedures  . NM PET Image Initial (PI) Whole Body    Standing Status: Future     Number of Occurrences:  Standing Expiration Date: 06/14/2016     Order Specific Question:  Reason for Exam (SYMPTOM  OR DIAGNOSIS REQUIRED)    Answer:  work up spiculated 51m mass since in upper part of right lower lobe [facility]    Order Specific Question:  Preferred imaging location?    Answer:  External    Order Specific Question:  If indicated for the ordered procedure, I authorize the administration of a radiopharmaceutical per Radiology protocol    Answer:  Yes  . HIV antibody  . Lipid panel    Order Specific Question:  Has the patient fasted?    Answer:  Yes  . TSH  . Comprehensive metabolic panel    Order Specific Question:  Has the patient fasted?    Answer:  Yes  . Hepatitis C Antibody  . Sedimentation Rate  . Ambulatory referral to Oncology    Referral Priority:  Urgent    Referral Type:  Consultation    Referral Reason:  Specialty Services Required    Number of Visits Requested:  1  . POCT urinalysis dipstick  . POCT CBC    Meds ordered this encounter  Medications  . DISCONTD: HYDROcodone-acetaminophen (NORCO/VICODIN) 5-325 MG tablet    Sig: Take 1 tablet by mouth every 6 (six) hours as needed for moderate pain.  .Marland Kitchenzoster vaccine live, PF, (ZOSTAVAX) 123536UNT/0.65ML injection    Sig: Inject 19,400 Units into the skin once.    Dispense:  1 each    Refill:  0  . Dextromethorphan-Guaifenesin (MUCINEX DM MAXIMUM STRENGTH) 60-1200 MG TB12    Sig: Take 1 tablet by mouth every 12 (twelve) hours.    Dispense:  14 each    Refill:  1  . montelukast (SINGULAIR) 10 MG tablet    Sig: Take 1 tablet (10 mg total) by mouth at bedtime.    Dispense:  30 tablet    Refill:  3  . levocetirizine (XYZAL) 5 MG tablet    Sig: Take 1 tablet (5 mg total) by mouth every evening.    Dispense:  30 tablet    Refill:  11  . triamcinolone (NASACORT ALLERGY 24HR) 55 MCG/ACT AERO nasal inhaler    Sig: Place 2 sprays into the nose at bedtime.    Dispense:  1 Inhaler    Refill:  2  . HYDROcodone-acetaminophen  (NORCO/VICODIN) 5-325 MG tablet    Sig: Take 1 tablet by mouth every 6 (six) hours as needed for moderate pain.    Dispense:  60 tablet    Refill:  0  . clonazePAM (KLONOPIN) 1 MG tablet    Sig: Take 1/2 tablet as needed twice a day for anxiety and 1-2 tablets at night as needed for sleep    Dispense:  60 tablet    Refill:  0    EDelman Cheadle MD MPH  Results for orders placed or performed in visit on 04/15/15  HIV antibody  Result Value Ref Range   HIV 1&2 Ab, 4th Generation NONREACTIVE NONREACTIVE  Lipid panel  Result Value Ref Range   Cholesterol 260 (H) 125 - 200 mg/dL   Triglycerides 108 <150 mg/dL   HDL 67 >=40 mg/dL   Total CHOL/HDL Ratio 3.9 <=5.0 Ratio   VLDL 22 <30 mg/dL   LDL Cholesterol 171 (H) <130 mg/dL  TSH  Result Value Ref Range   TSH 1.06 0.40 - 4.50 mIU/L  Comprehensive metabolic panel  Result Value Ref  Range   Sodium 142 135 - 146 mmol/L   Potassium 4.2 3.5 - 5.3 mmol/L   Chloride 101 98 - 110 mmol/L   CO2 29 20 - 31 mmol/L   Glucose, Bld 90 65 - 99 mg/dL   BUN 4 (L) 7 - 25 mg/dL   Creat 1.00 0.70 - 1.25 mg/dL   Total Bilirubin 0.6 0.2 - 1.2 mg/dL   Alkaline Phosphatase 97 40 - 115 U/L   AST 19 10 - 35 U/L   ALT 19 9 - 46 U/L   Total Protein 7.6 6.1 - 8.1 g/dL   Albumin 4.7 3.6 - 5.1 g/dL   Calcium 9.9 8.6 - 10.3 mg/dL  Hepatitis C Antibody  Result Value Ref Range   HCV Ab NEGATIVE NEGATIVE  Sedimentation Rate  Result Value Ref Range   Sed Rate 1 0 - 20 mm/hr  POCT urinalysis dipstick  Result Value Ref Range   Color, UA yellow yellow   Clarity, UA clear clear   Glucose, UA negative negative   Bilirubin, UA negative negative   Ketones, POC UA negative negative   Spec Grav, UA 1.020    Blood, UA negative negative   pH, UA 5.5    Protein Ur, POC negative negative   Urobilinogen, UA 0.2    Nitrite, UA Negative Negative   Leukocytes, UA Negative Negative  POCT CBC  Result Value Ref Range   WBC 7.0 4.6 - 10.2 K/uL   Lymph, poc 2.3 0.6 - 3.4     POC LYMPH PERCENT 32.7 10 - 50 %L   MID (cbc) 0.3 0 - 0.9   POC MID % 4.9 0 - 12 %M   POC Granulocyte 4.4 2 - 6.9   Granulocyte percent 62.4 37 - 80 %G   RBC 5.97 4.69 - 6.13 M/uL   Hemoglobin 17.7 14.1 - 18.1 g/dL   HCT, POC 52.0 43.5 - 53.7 %   MCV 87.0 80 - 97 fL   MCH, POC 29.7 27 - 31.2 pg   MCHC 34.1 31.8 - 35.4 g/dL   RDW, POC 13.0 %   Platelet Count, POC 195 142 - 424 K/uL   MPV 7.3 0 - 99.8 fL

## 2015-04-15 ENCOUNTER — Encounter: Payer: Self-pay | Admitting: Family Medicine

## 2015-04-15 ENCOUNTER — Ambulatory Visit (INDEPENDENT_AMBULATORY_CARE_PROVIDER_SITE_OTHER): Payer: Managed Care, Other (non HMO) | Admitting: Family Medicine

## 2015-04-15 VITALS — BP 151/95 | HR 96 | Temp 97.8°F | Resp 16 | Ht 68.0 in | Wt 174.0 lb

## 2015-04-15 DIAGNOSIS — Z1383 Encounter for screening for respiratory disorder NEC: Secondary | ICD-10-CM | POA: Diagnosis not present

## 2015-04-15 DIAGNOSIS — R911 Solitary pulmonary nodule: Secondary | ICD-10-CM | POA: Diagnosis not present

## 2015-04-15 DIAGNOSIS — Z113 Encounter for screening for infections with a predominantly sexual mode of transmission: Secondary | ICD-10-CM

## 2015-04-15 DIAGNOSIS — Z122 Encounter for screening for malignant neoplasm of respiratory organs: Secondary | ICD-10-CM

## 2015-04-15 DIAGNOSIS — J329 Chronic sinusitis, unspecified: Secondary | ICD-10-CM | POA: Diagnosis not present

## 2015-04-15 DIAGNOSIS — Z1329 Encounter for screening for other suspected endocrine disorder: Secondary | ICD-10-CM | POA: Diagnosis not present

## 2015-04-15 DIAGNOSIS — Z0000 Encounter for general adult medical examination without abnormal findings: Secondary | ICD-10-CM | POA: Diagnosis not present

## 2015-04-15 DIAGNOSIS — J449 Chronic obstructive pulmonary disease, unspecified: Secondary | ICD-10-CM

## 2015-04-15 DIAGNOSIS — Z1211 Encounter for screening for malignant neoplasm of colon: Secondary | ICD-10-CM

## 2015-04-15 DIAGNOSIS — Z130 Encounter for screening for diseases of the blood and blood-forming organs and certain disorders involving the immune mechanism: Secondary | ICD-10-CM | POA: Diagnosis not present

## 2015-04-15 DIAGNOSIS — E785 Hyperlipidemia, unspecified: Secondary | ICD-10-CM

## 2015-04-15 DIAGNOSIS — Z87891 Personal history of nicotine dependence: Secondary | ICD-10-CM

## 2015-04-15 DIAGNOSIS — Z1212 Encounter for screening for malignant neoplasm of rectum: Secondary | ICD-10-CM

## 2015-04-15 DIAGNOSIS — Z136 Encounter for screening for cardiovascular disorders: Secondary | ICD-10-CM

## 2015-04-15 DIAGNOSIS — Z125 Encounter for screening for malignant neoplasm of prostate: Secondary | ICD-10-CM | POA: Diagnosis not present

## 2015-04-15 DIAGNOSIS — Z1389 Encounter for screening for other disorder: Secondary | ICD-10-CM | POA: Diagnosis not present

## 2015-04-15 LAB — POCT CBC
Granulocyte percent: 62.4 %G (ref 37–80)
HCT, POC: 52 % (ref 43.5–53.7)
Hemoglobin: 17.7 g/dL (ref 14.1–18.1)
Lymph, poc: 2.3 (ref 0.6–3.4)
MCH, POC: 29.7 pg (ref 27–31.2)
MCHC: 34.1 g/dL (ref 31.8–35.4)
MCV: 87 fL (ref 80–97)
MID (cbc): 0.3 (ref 0–0.9)
MPV: 7.3 fL (ref 0–99.8)
POC Granulocyte: 4.4 (ref 2–6.9)
POC LYMPH PERCENT: 32.7 %L (ref 10–50)
POC MID %: 4.9 %M (ref 0–12)
Platelet Count, POC: 195 10*3/uL (ref 142–424)
RBC: 5.97 M/uL (ref 4.69–6.13)
RDW, POC: 13 %
WBC: 7 10*3/uL (ref 4.6–10.2)

## 2015-04-15 LAB — SEDIMENTATION RATE: Sed Rate: 1 mm/hr (ref 0–20)

## 2015-04-15 LAB — POCT URINALYSIS DIP (MANUAL ENTRY)
Bilirubin, UA: NEGATIVE
Blood, UA: NEGATIVE
Glucose, UA: NEGATIVE
Ketones, POC UA: NEGATIVE
Leukocytes, UA: NEGATIVE
Nitrite, UA: NEGATIVE
Protein Ur, POC: NEGATIVE
Spec Grav, UA: 1.02
Urobilinogen, UA: 0.2
pH, UA: 5.5

## 2015-04-15 LAB — LIPID PANEL
Cholesterol: 260 mg/dL — ABNORMAL HIGH (ref 125–200)
HDL: 67 mg/dL (ref >=40)
LDL Cholesterol: 171 mg/dL — ABNORMAL HIGH (ref <130)
Total CHOL/HDL Ratio: 3.9 Ratio (ref <=5.0)
Triglycerides: 108 mg/dL (ref <150)
VLDL: 22 mg/dL (ref <30)

## 2015-04-15 LAB — COMPREHENSIVE METABOLIC PANEL
ALT: 19 U/L (ref 9–46)
AST: 19 U/L (ref 10–35)
Albumin: 4.7 g/dL (ref 3.6–5.1)
Alkaline Phosphatase: 97 U/L (ref 40–115)
BUN: 4 mg/dL — ABNORMAL LOW (ref 7–25)
CO2: 29 mmol/L (ref 20–31)
Calcium: 9.9 mg/dL (ref 8.6–10.3)
Chloride: 101 mmol/L (ref 98–110)
Creat: 1 mg/dL (ref 0.70–1.25)
Glucose, Bld: 90 mg/dL (ref 65–99)
Potassium: 4.2 mmol/L (ref 3.5–5.3)
Sodium: 142 mmol/L (ref 135–146)
Total Bilirubin: 0.6 mg/dL (ref 0.2–1.2)
Total Protein: 7.6 g/dL (ref 6.1–8.1)

## 2015-04-15 LAB — HEPATITIS C ANTIBODY: HCV Ab: NEGATIVE

## 2015-04-15 LAB — HIV ANTIBODY (ROUTINE TESTING W REFLEX): HIV 1&2 Ab, 4th Generation: NONREACTIVE

## 2015-04-15 LAB — TSH: TSH: 1.06 mIU/L (ref 0.40–4.50)

## 2015-04-15 MED ORDER — HYDROCODONE-ACETAMINOPHEN 5-325 MG PO TABS: 1.0000 | ORAL_TABLET | Freq: Four times a day (QID) | ORAL | Status: DC | PRN

## 2015-04-15 MED ORDER — LEVOCETIRIZINE DIHYDROCHLORIDE 5 MG PO TABS: 5.0000 mg | ORAL_TABLET | Freq: Every evening | ORAL | Status: DC

## 2015-04-15 MED ORDER — MUCINEX DM MAXIMUM STRENGTH 60-1200 MG PO TB12: 1.0000 | ORAL_TABLET | Freq: Two times a day (BID) | ORAL | Status: DC

## 2015-04-15 MED ORDER — CLONAZEPAM 1 MG PO TABS: ORAL_TABLET | ORAL | Status: DC

## 2015-04-15 MED ORDER — MONTELUKAST SODIUM 10 MG PO TABS: 10.0000 mg | ORAL_TABLET | Freq: Every day | ORAL | Status: DC

## 2015-04-15 MED ORDER — ZOSTER VACCINE LIVE 19400 UNT/0.65ML ~~LOC~~ SOLR
0.6500 mL | Freq: Once | SUBCUTANEOUS | Status: DC
Start: 2015-04-15 — End: 2015-05-11

## 2015-04-15 MED ORDER — TRIAMCINOLONE ACETONIDE 55 MCG/ACT NA AERO: 2.0000 | INHALATION_SPRAY | Freq: Every day | NASAL | Status: DC

## 2015-04-19 ENCOUNTER — Telehealth: Payer: Self-pay | Admitting: Internal Medicine

## 2015-04-19 ENCOUNTER — Telehealth: Payer: Self-pay | Admitting: *Deleted

## 2015-04-19 DIAGNOSIS — R911 Solitary pulmonary nodule: Secondary | ICD-10-CM

## 2015-04-19 NOTE — Telephone Encounter (Signed)
-----   Message from [name], MD sent at 04/17/2015  6:35 AM EST ----- Needs PET eval RLL nodule

## 2015-04-19 NOTE — Telephone Encounter (Signed)
TC, received fast busy signal x 3 WCB

## 2015-04-20 ENCOUNTER — Encounter: Payer: Self-pay | Admitting: Family Medicine

## 2015-04-20 DIAGNOSIS — E785 Hyperlipidemia, unspecified: Secondary | ICD-10-CM | POA: Insufficient documentation

## 2015-04-20 DIAGNOSIS — J329 Chronic sinusitis, unspecified: Secondary | ICD-10-CM | POA: Insufficient documentation

## 2015-04-20 MED ORDER — PRAVASTATIN SODIUM 40 MG PO TABS: 40.0000 mg | ORAL_TABLET | Freq: Every day | ORAL | Status: DC

## 2015-04-20 NOTE — Telephone Encounter (Signed)
Spoke with pt. Explained to him what a PET scan is and what will happen. All of his questions were answered. Nothing further was needed.

## 2015-04-20 NOTE — Addendum Note (Signed)
Addended by: [name] on: 04/20/2015 10:20 AM   Modules accepted: Orders, SmartSet

## 2015-04-21 ENCOUNTER — Other Ambulatory Visit: Payer: Self-pay | Admitting: Internal Medicine

## 2015-04-23 NOTE — Addendum Note (Signed)
Addended by: Wyatt Haste on: 04/23/2015 07:56 AM   Modules accepted: Miquel Dunn

## 2015-04-27 ENCOUNTER — Ambulatory Visit (HOSPITAL_COMMUNITY): Payer: Managed Care, Other (non HMO)

## 2015-05-03 ENCOUNTER — Ambulatory Visit (HOSPITAL_COMMUNITY)
Admission: RE | Admit: 2015-05-03 | Discharge: 2015-05-03 | Disposition: A | Discharge status: Home / Self Care | Payer: Managed Care, Other (non HMO) | Source: Ambulatory Visit | Attending: Internal Medicine | Admitting: Internal Medicine

## 2015-05-03 DIAGNOSIS — R911 Solitary pulmonary nodule: Secondary | ICD-10-CM

## 2015-05-03 DIAGNOSIS — J439 Emphysema, unspecified: Secondary | ICD-10-CM | POA: Diagnosis not present

## 2015-05-03 DIAGNOSIS — R918 Other nonspecific abnormal finding of lung field: Secondary | ICD-10-CM | POA: Insufficient documentation

## 2015-05-03 LAB — GLUCOSE, CAPILLARY: Glucose-Capillary: 105 mg/dL — ABNORMAL HIGH (ref 65–99)

## 2015-05-03 MED ORDER — FLUDEOXYGLUCOSE F - 18 (FDG) INJECTION
10.3000 | Freq: Once | INTRAVENOUS | Status: AC | PRN
  Administered 2015-05-03: 10.3 via INTRAVENOUS

## 2015-05-04 ENCOUNTER — Other Ambulatory Visit: Payer: Self-pay | Admitting: Internal Medicine

## 2015-05-04 ENCOUNTER — Telehealth: Payer: Self-pay | Admitting: *Deleted

## 2015-05-04 DIAGNOSIS — R911 Solitary pulmonary nodule: Secondary | ICD-10-CM

## 2015-05-04 NOTE — Telephone Encounter (Signed)
Oncology Nurse Navigator Documentation  Oncology Nurse Navigator Flowsheets 05/04/2015  Navigator Encounter Type Introductory phone call  Treatment Phase Pre-Tx/Tx Discussion  Barriers/Navigation Needs Coordination of Care  Interventions Coordination of Care  Coordination of Care Appts  Acuity Level 1  Acuity Level 1 Initial guidance, education and coordination as needed  Time Spent with Patient 15   I received referral on [name].  He had his PET scan yesterday.  I called and gave him an appt to see T surgery on 05/13/15 arrive at 3:45.  He verbalized understanding of appt time and place.

## 2015-05-04 NOTE — Progress Notes (Signed)
Quick Note:  Referral made ______

## 2015-05-05 ENCOUNTER — Emergency Department (HOSPITAL_BASED_OUTPATIENT_CLINIC_OR_DEPARTMENT_OTHER)
Admission: EM | Admit: 2015-05-05 | Discharge: 2015-05-05 | Disposition: A | Discharge status: Home / Self Care | Payer: Managed Care, Other (non HMO) | Attending: Emergency Medicine | Admitting: Emergency Medicine

## 2015-05-05 ENCOUNTER — Encounter (HOSPITAL_BASED_OUTPATIENT_CLINIC_OR_DEPARTMENT_OTHER): Payer: Self-pay

## 2015-05-05 ENCOUNTER — Emergency Department (HOSPITAL_BASED_OUTPATIENT_CLINIC_OR_DEPARTMENT_OTHER): Payer: Managed Care, Other (non HMO)

## 2015-05-05 DIAGNOSIS — K219 Gastro-esophageal reflux disease without esophagitis: Secondary | ICD-10-CM | POA: Diagnosis not present

## 2015-05-05 DIAGNOSIS — C3490 Malignant neoplasm of unspecified part of unspecified bronchus or lung: Secondary | ICD-10-CM | POA: Diagnosis not present

## 2015-05-05 DIAGNOSIS — M1990 Unspecified osteoarthritis, unspecified site: Secondary | ICD-10-CM | POA: Insufficient documentation

## 2015-05-05 DIAGNOSIS — J040 Acute laryngitis: Secondary | ICD-10-CM | POA: Diagnosis not present

## 2015-05-05 DIAGNOSIS — D849 Immunodeficiency, unspecified: Secondary | ICD-10-CM | POA: Diagnosis not present

## 2015-05-05 DIAGNOSIS — Z79899 Other long term (current) drug therapy: Secondary | ICD-10-CM | POA: Diagnosis not present

## 2015-05-05 DIAGNOSIS — J449 Chronic obstructive pulmonary disease, unspecified: Secondary | ICD-10-CM | POA: Insufficient documentation

## 2015-05-05 DIAGNOSIS — Z7951 Long term (current) use of inhaled steroids: Secondary | ICD-10-CM | POA: Insufficient documentation

## 2015-05-05 DIAGNOSIS — Z87891 Personal history of nicotine dependence: Secondary | ICD-10-CM | POA: Diagnosis not present

## 2015-05-05 DIAGNOSIS — J029 Acute pharyngitis, unspecified: Secondary | ICD-10-CM | POA: Diagnosis present

## 2015-05-05 LAB — RAPID STREP SCREEN (MED CTR MEBANE ONLY): Streptococcus, Group A Screen (Direct): NEGATIVE

## 2015-05-05 NOTE — ED Notes (Signed)
EDP states CT and IV-saline lock orders cancelled

## 2015-05-05 NOTE — ED Notes (Signed)
C/o sore throat since Monday-NAD-steady gait

## 2015-05-05 NOTE — ED Provider Notes (Addendum)
CSN: [id number]     Arrival date & time 05/05/15  1401 History   First MD Initiated Contact with Patient 05/05/15 1459     Chief Complaint  Patient presents with  . Sore Throat     (Consider location/radiation/quality/duration/timing/severity/associated sxs/prior Treatment) HPI Plans of sore throat with pain on swallowing (points to laryngeal cartilage) onset 3 days ago the pain is mild. Denies fever. He treated himself with Advil flu medicine and salt water gargles, without relief. No fever no other associated symptoms discomfort is minimal prep at present. Other associated symptoms include mild hoarseness. Past Medical History  Diagnosis Date  . Allergy   . Ulcer   . COPD (chronic obstructive pulmonary disease) ([place])     per patient's health survey - he put a ?  . GERD (gastroesophageal reflux disease)   . Arthritis   . Shortness of breath    Past Surgical History  Procedure Laterality Date  . Spine surgery  2012  . Colonoscopy  14    polyps rem  . Joint replacement Right 2010  . Anterior fusion lumbar spine  07/25/2012  . Anterior lat lumbar fusion Left 07/25/2012    Procedure: ANTERIOR LATERAL LUMBAR FUSION 1 LEVEL;  Surgeon: [name], MD;  Location: [place];  Service: Orthopedics;  Laterality: Left;  Left sided lumbar 3-4 lateral interbody fusion with allograft and instrumentation   Family History  Problem Relation Age of Onset  . Cancer Mother     colon  . Heart disease Father   . Diabetes Sister   . Hypertension Sister   . Hypertension Brother   . Heart disease Maternal Grandmother     heart attack   Social History  Substance Use Topics  . Smoking status: Former Smoker -- 0.50 packs/day for 20 years    Types: Cigarettes    Quit date: 09/24/2011  . Smokeless tobacco: Never Used  . Alcohol Use: 0.0 oz/week    0 Standard drinks or equivalent per week     Comment: weekly    Review of Systems  HENT: Positive for sore throat and voice change.    Allergic/Immunologic: Positive for immunocompromised state.       Currently treated for lung cancer      Allergies  Aspirin and Morphine and related  Home Medications   Prior to Admission medications   Medication Sig Start Date End Date Taking? Authorizing Provider  Albuterol Sulfate (PROAIR RESPICLICK) 867 (90 BASE) MCG/ACT AEPB Inhale 2 puffs into the lungs every 4 (four) hours as needed. 10/20/14   [name], MD  budesonide-formoterol (SYMBICORT) 80-4.5 MCG/ACT inhaler Inhale 2 puffs into the lungs 2 (two) times daily. 03/27/15   [name], MD  clonazePAM Bobbye [place]) 1 MG tablet Take 1/2 tablet as needed twice a day for anxiety and 1-2 tablets at night as needed for sleep 04/15/15   [name], MD  dexlansoprazole (DEXILANT) 60 MG capsule Take 1 capsule (60 mg total) by mouth daily. 06/08/14   Roselee Culver, MD  Dextromethorphan-Guaifenesin (MUCINEX DM MAXIMUM STRENGTH) 60-1200 MG TB12 Take 1 tablet by mouth every 12 (twelve) hours. 04/15/15   [name], MD  HYDROcodone-acetaminophen (NORCO/VICODIN) 5-325 MG tablet Take 1 tablet by mouth every 6 (six) hours as needed for moderate pain. 04/15/15   [name], MD  levocetirizine (XYZAL) 5 MG tablet Take 1 tablet (5 mg total) by mouth every evening. 04/15/15   [name], MD  montelukast (SINGULAIR) 10 MG  tablet Take 1 tablet (10 mg total) by mouth at bedtime. 04/15/15   [name], MD  pravastatin (PRAVACHOL) 40 MG tablet Take 1 tablet (40 mg total) by mouth at bedtime. 04/20/15   [name], MD  SYMBICORT 80-4.5 MCG/ACT inhaler INHALE 2 PUFFS BY MOUTH TWICE A DAY 04/21/15   [name], MD  triamcinolone (NASACORT ALLERGY 24HR) 55 MCG/ACT AERO nasal inhaler Place 2 sprays into the nose at bedtime. 04/15/15   [name], MD  zoster vaccine live, PF, (ZOSTAVAX) 32355 UNT/0.65ML injection Inject 19,400 Units into the skin once. 04/15/15   [name], MD   BP 156/99 mmHg  Pulse 94  Temp(Src) 98.2 F (36.8 C) (Oral)  Resp 18  Ht '5\' 9"'$  (1.753 m)   Wt 174 lb (78.926 kg)  BMI 25.68 kg/m2  SpO2 96% Physical Exam  Constitutional: He is oriented to person, place, and time. He appears well-developed and well-nourished. No distress.  HENT:  Head: Normocephalic and atraumatic.  Right Ear: External ear normal.  Left Ear: External ear normal.  Oropharynx is minimally reddened  Eyes: Conjunctivae are normal. Pupils are equal, round, and reactive to light.  Neck: Neck supple. No tracheal deviation present. No thyromegaly present.  Cardiovascular: Normal rate and regular rhythm.   No murmur heard. Pulmonary/Chest: Effort normal and breath sounds normal.  Abdominal: Soft. Bowel sounds are normal. He exhibits no distension. There is no tenderness.  Musculoskeletal: Normal range of motion. He exhibits no edema or tenderness.  Lymphadenopathy:    He has no cervical adenopathy.  Neurological: He is alert and oriented to person, place, and time. Coordination normal.  Skin: Skin is warm and dry. No rash noted.  Psychiatric: He has a normal mood and affect.  Nursing note and vitals reviewed.   ED Course  Procedures (including critical care time) Labs Review Labs Reviewed  RAPID STREP SCREEN (NOT AT [facility])  CULTURE, GROUP A STREP [facility])    Imaging Review Nm Pet Image Initial (pi) Skull Base To Thigh  05/03/2015  CLINICAL DATA:  Initial treatment strategy for pulmonary nodule. EXAM: NUCLEAR MEDICINE PET SKULL BASE TO THIGH TECHNIQUE: 10.1 mCi F-18 FDG was injected intravenously. Full-ring PET imaging was performed from the skull base to thigh after the radiotracer. CT data was obtained and used for attenuation correction and anatomic localization. FASTING BLOOD GLUCOSE:  Value: 105 Mg/dl COMPARISON:  None. FINDINGS: NECK No hypermetabolic lymph nodes in the neck. Asymmetric increased uptake within the right base of tongue/tonsillar region is identified. The SUV max is equal to 6.99. CHEST Advanced smoking related changes are identified within both  lungs. Within the posterior aspect of the superior segment of right lower lobe there is a spiculated subpleural nodule measuring 11 mm, image 73 of series 4. The SUV max associated with this nodule is equal to 9.76. No hypermetabolic hilar or mediastinal lymph nodes identified. ABDOMEN/PELVIS No abnormal hypermetabolic activity within the liver, pancreas, adrenal glands, or spleen. No hypermetabolic lymph nodes in the abdomen or pelvis. SKELETON No focal hypermetabolic activity to suggest skeletal metastasis. IMPRESSION: 1. There is intense radiotracer uptake associated with the subpleural nodule in the superior segment of the right lower lobe. Findings are worrisome for primary bronchogenic carcinoma. No evidence for hypermetabolic hilar or mediastinal lymph node metastasis or distant metastatic disease. 2. Asymmetric increased uptake within the right tonsillar region is identified. Nonspecific in may be physiologic. In a patient that may have risk factors for head neck carcinoma correlation  with direct visualization is advised to exclude the possibility of a base of tongue or tonsillar lesion. 3. Emphysema. Electronically Signed   By: Kerby Moors M.D.   On: 05/03/2015 17:37   I have personally reviewed and evaluated these images and lab results as part of my medical decision-making.   EKG Interpretation None     Results for orders placed or performed during the hospital encounter of 05/05/15  Rapid strep screen  Result Value Ref Range   Streptococcus, Group A Screen (Direct) NEGATIVE NEGATIVE   Nm Pet Image Initial (pi) Skull Base To Thigh  05/03/2015  CLINICAL DATA:  Initial treatment strategy for pulmonary nodule. EXAM: NUCLEAR MEDICINE PET SKULL BASE TO THIGH TECHNIQUE: 10.1 mCi F-18 FDG was injected intravenously. Full-ring PET imaging was performed from the skull base to thigh after the radiotracer. CT data was obtained and used for attenuation correction and anatomic localization. FASTING  BLOOD GLUCOSE:  Value: 105 Mg/dl COMPARISON:  None. FINDINGS: NECK No hypermetabolic lymph nodes in the neck. Asymmetric increased uptake within the right base of tongue/tonsillar region is identified. The SUV max is equal to 6.99. CHEST Advanced smoking related changes are identified within both lungs. Within the posterior aspect of the superior segment of right lower lobe there is a spiculated subpleural nodule measuring 11 mm, image 73 of series 4. The SUV max associated with this nodule is equal to 9.76. No hypermetabolic hilar or mediastinal lymph nodes identified. ABDOMEN/PELVIS No abnormal hypermetabolic activity within the liver, pancreas, adrenal glands, or spleen. No hypermetabolic lymph nodes in the abdomen or pelvis. SKELETON No focal hypermetabolic activity to suggest skeletal metastasis. IMPRESSION: 1. There is intense radiotracer uptake associated with the subpleural nodule in the superior segment of the right lower lobe. Findings are worrisome for primary bronchogenic carcinoma. No evidence for hypermetabolic hilar or mediastinal lymph node metastasis or distant metastatic disease. 2. Asymmetric increased uptake within the right tonsillar region is identified. Nonspecific in may be physiologic. In a patient that may have risk factors for head neck carcinoma correlation with direct visualization is advised to exclude the possibility of a base of tongue or tonsillar lesion. 3. Emphysema. Electronically Signed   By: Kerby Moors M.D.   On: 05/03/2015 17:37   Results for orders placed or performed during the hospital encounter of 05/05/15  Rapid strep screen  Result Value Ref Range   Streptococcus, Group A Screen (Direct) NEGATIVE NEGATIVE   Nm Pet Image Initial (pi) Skull Base To Thigh  05/03/2015  CLINICAL DATA:  Initial treatment strategy for pulmonary nodule. EXAM: NUCLEAR MEDICINE PET SKULL BASE TO THIGH TECHNIQUE: 10.1 mCi F-18 FDG was injected intravenously. Full-ring PET imaging was  performed from the skull base to thigh after the radiotracer. CT data was obtained and used for attenuation correction and anatomic localization. FASTING BLOOD GLUCOSE:  Value: 105 Mg/dl COMPARISON:  None. FINDINGS: NECK No hypermetabolic lymph nodes in the neck. Asymmetric increased uptake within the right base of tongue/tonsillar region is identified. The SUV max is equal to 6.99. CHEST Advanced smoking related changes are identified within both lungs. Within the posterior aspect of the superior segment of right lower lobe there is a spiculated subpleural nodule measuring 11 mm, image 73 of series 4. The SUV max associated with this nodule is equal to 9.76. No hypermetabolic hilar or mediastinal lymph nodes identified. ABDOMEN/PELVIS No abnormal hypermetabolic activity within the liver, pancreas, adrenal glands, or spleen. No hypermetabolic lymph nodes in the abdomen or pelvis. SKELETON No  focal hypermetabolic activity to suggest skeletal metastasis. IMPRESSION: 1. There is intense radiotracer uptake associated with the subpleural nodule in the superior segment of the right lower lobe. Findings are worrisome for primary bronchogenic carcinoma. No evidence for hypermetabolic hilar or mediastinal lymph node metastasis or distant metastatic disease. 2. Asymmetric increased uptake within the right tonsillar region is identified. Nonspecific in may be physiologic. In a patient that may have risk factors for head neck carcinoma correlation with direct visualization is advised to exclude the possibility of a base of tongue or tonsillar lesion. 3. Emphysema. Electronically Signed   By: Kerby Moors M.D.   On: 05/03/2015 17:37     MDM  Doubt epiglottitis. No tenderness laryngeal cartilage. Symptoms most consistent with laryngitis. I discussed obtaining a CT scan of neck with patient. CT scan of soft tissue neck was offered to him. He declined. Rather he elects to wait a few days continue saltwater gargles return if  symptoms worsen or not improved in 3 days. Suggest blood pressure recheck 3 weeks Dx #1 laryngitis #2 elevated blood pressure Final diagnoses:  None        Orlie Dakin, MD 05/05/15 [place], MD 05/05/15 1527

## 2015-05-05 NOTE — Discharge Instructions (Signed)
Laryngitis Dissolve 1/4 teaspoon of salt in 8 ounces of warm water and gargle 4 times daily. If you're not improved in 3 days, return or see your primary care physician. Return sooner if you feel worse for any reason. Get your blood pressure recheck within the next 3 weeks. Today's was mildly elevated at 155/92. Laryngitis is inflammation of your vocal cords. This causes hoarseness, coughing, loss of voice, sore throat, or a dry throat. Your vocal cords are two bands of muscles that are found in your throat. When you speak, these cords come together and vibrate. These vibrations come out through your mouth as sound. When your vocal cords are inflamed, your voice sounds different. Laryngitis can be temporary (acute) or long-term (chronic). Most cases of acute laryngitis improve with time. Chronic laryngitis is laryngitis that lasts for more than three weeks. CAUSES Acute laryngitis may be caused by:  A viral infection.  Lots of talking, yelling, or singing. This is also called vocal strain.  Bacterial infections. Chronic laryngitis may be caused by:  Vocal strain.  Injury to your vocal cords.  Acid reflux (gastroesophageal reflux disease or GERD).  Allergies.  Sinus infection.  Smoking.  Alcohol abuse.  Breathing in chemicals or dust.  Growths on the vocal cords. RISK FACTORS Risk factors for laryngitis include:  Smoking.  Alcohol abuse.  Having allergies. SIGNS AND SYMPTOMS Symptoms of laryngitis may include:  Low, hoarse voice.  Loss of voice.  Dry cough.  Sore throat.  Stuffy nose. DIAGNOSIS Laryngitis may be diagnosed by:  Physical exam.  Throat culture.  Blood test.  Laryngoscopy. This procedure allows your health care provider to look at your vocal cords with a mirror or viewing tube. TREATMENT Treatment for laryngitis depends on what is causing it. Usually, treatment involves resting your voice and using medicines to soothe your throat. However, if  your laryngitis is caused by a bacterial infection, you may need to take antibiotic medicine. If your laryngitis is caused by a growth, you may need to have a procedure to remove it. HOME CARE INSTRUCTIONS  Drink enough fluid to keep your urine clear or pale yellow.  Breathe in moist air. Use a humidifier if you live in a dry climate.  Take medicines only as directed by your health care provider.  If you were prescribed an antibiotic medicine, finish it all even if you start to feel better.  Do not smoke cigarettes or electronic cigarettes. If you need help quitting, ask your health care provider.  Talk as little as possible. Also avoid whispering, which can cause vocal strain.  Write instead of talking. Do this until your voice is back to normal. SEEK MEDICAL CARE IF:  You have a fever.  You have increasing pain.  You have difficulty swallowing. SEEK IMMEDIATE MEDICAL CARE IF:  You cough up blood.  You have trouble breathing.   This information is not intended to replace advice given to you by your health care provider. Make sure you discuss any questions you have with your health care provider.   Document Released: 01/23/2005 Document Revised: 02/13/2014 Document Reviewed: 07/08/2013 Elsevier Interactive Patient Education [employer].

## 2015-05-07 ENCOUNTER — Telehealth: Payer: Self-pay | Admitting: Internal Medicine

## 2015-05-07 NOTE — Telephone Encounter (Signed)
Called and left message on voicemail for Elijah Marshall to call back with more information. We have a CT Maxillofacial from 04/08/15, not sure if this is what they are wanting. Awaiting call back from [facility] to confirm.

## 2015-05-08 LAB — CULTURE, GROUP A STREP (THRC)

## 2015-05-10 ENCOUNTER — Encounter: Payer: Managed Care, Other (non HMO) | Admitting: Thoracic Surgery (Cardiothoracic Vascular Surgery)

## 2015-05-11 ENCOUNTER — Institutional Professional Consult (permissible substitution) (INDEPENDENT_AMBULATORY_CARE_PROVIDER_SITE_OTHER): Payer: Managed Care, Other (non HMO) | Admitting: Thoracic Surgery (Cardiothoracic Vascular Surgery)

## 2015-05-11 ENCOUNTER — Encounter: Payer: Self-pay | Admitting: Thoracic Surgery (Cardiothoracic Vascular Surgery)

## 2015-05-11 VITALS — BP 150/100 | HR 100 | Resp 20 | Ht 69.0 in | Wt 175.0 lb

## 2015-05-11 DIAGNOSIS — R911 Solitary pulmonary nodule: Secondary | ICD-10-CM

## 2015-05-11 NOTE — Telephone Encounter (Signed)
Called and LM for [employer] x 1

## 2015-05-11 NOTE — Progress Notes (Deleted)
  HPI:  Patient returns for routine postoperative follow-up having undergone  The patient's early postoperative recovery while in the hospital was notable for Since hospital discharge the patient reports   Current Outpatient Prescriptions  Medication Sig Dispense Refill  . Albuterol Sulfate (PROAIR RESPICLICK) 599 (90 BASE) MCG/ACT AEPB Inhale 2 puffs into the lungs every 4 (four) hours as needed. 1 each 11  . budesonide-formoterol (SYMBICORT) 80-4.5 MCG/ACT inhaler Inhale 2 puffs into the lungs 2 (two) times daily. 1 Inhaler 5  . clonazePAM (KLONOPIN) 1 MG tablet Take 1/2 tablet as needed twice a day for anxiety and 1-2 tablets at night as needed for sleep 60 tablet 0  . dexlansoprazole (DEXILANT) 60 MG capsule Take 1 capsule (60 mg total) by mouth daily. 30 capsule 12  . Dextromethorphan-Guaifenesin (MUCINEX DM MAXIMUM STRENGTH) 60-1200 MG TB12 Take 1 tablet by mouth every 12 (twelve) hours. 14 each 1  . HYDROcodone-acetaminophen (NORCO/VICODIN) 5-325 MG tablet Take 1 tablet by mouth every 6 (six) hours as needed for moderate pain. 60 tablet 0  . levocetirizine (XYZAL) 5 MG tablet Take 1 tablet (5 mg total) by mouth every evening. 30 tablet 11  . montelukast (SINGULAIR) 10 MG tablet Take 1 tablet (10 mg total) by mouth at bedtime. 30 tablet 3  . pravastatin (PRAVACHOL) 40 MG tablet Take 1 tablet (40 mg total) by mouth at bedtime. 90 tablet 0  . triamcinolone (NASACORT ALLERGY 24HR) 55 MCG/ACT AERO nasal inhaler Place 2 sprays into the nose at bedtime. 1 Inhaler 2   No current facility-administered medications for this visit.    Physical Exam  Diagnostic Tests:   Impression:  Plan:   [name], MD [facility] [phone]

## 2015-05-11 NOTE — Progress Notes (Signed)
[address]       [place] [address]             [phone]      PCP is [name], MD Referring Provider is [name], MD  Chief Complaint  Patient presents with  . Lung Lesion    Surgical eval, PET Scan 05/03/15,Chest CT 04/08/15 '@[facility]'$  healthcare,  CXR 03/29/15    HPI: Elijah Marshall is sent for consultation regarding a right upper lobe nodule.  Elijah Marshall is a 65 year old man with a history of tobacco abuse, COPD, hyperlipidemia, and gastroesophageal reflux. He recently developed a dry cough and some wheezing. He was afraid that he was coming down with bronchitis and went to see Dr. Melvyn Novas. A chest x-ray showed a right lung nodule. A CT of the chest confirmed a 1.3 cm spiculated nodule in the superior segment of the right lower lobe. There was no suspicious hilar or mediastinal adenopathy. A PET CT showed the right upper lobe nodule was hypermetabolic with an SUV of 9.7. There was no hilar or mediastinal adenopathy. There was some activity in the right tonsillar area.  He had before we discussed the results of the CT and PET/CT that he had had a sore throat recently. That has resolved. He says he gets short of breath with heavy exertion such as walking up an incline or when he carries heavy objects. He walks about a mile and a half daily. 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 He has had a productive cough but denies hemoptysis. He does have occasional wheezing. He complains of frequent heartburn. He has not had any anginal-like chest pain pressure or tightness. His weight is stable and his appetite is good. He's not had any unusual headaches or visual changes. He is disabled due to multiple previous back surgeries and knee replacement.  Zubrod Score: At the time of surgery this patient's most appropriate activity status/level should be described as: '[x]'$     0    Normal activity, no  symptoms '[]'$     1    Restricted in physical strenuous activity but ambulatory, able to do out light work '[]'$     2    Ambulatory and capable of self care, unable to do work activities, up and about >50 % of waking hours                              '[]'$     3    Only limited self care, in bed greater than 50% of waking hours '[]'$     4    Completely disabled, no self care, confined to bed or chair '[]'$     5    Moribund   Past Medical History  Diagnosis Date  . Allergy   . Ulcer   . COPD (chronic obstructive pulmonary disease) ([facility])     per patient's health survey - he put a ?  . GERD (gastroesophageal reflux disease)   . Arthritis   . Shortness of breath     Past Surgical History  Procedure Laterality Date  . Spine surgery  2012  . Colonoscopy  14    polyps rem  . Joint replacement Right 2010  . Anterior fusion lumbar spine  07/25/2012  . Anterior lat lumbar fusion Left 07/25/2012  Procedure: ANTERIOR LATERAL LUMBAR FUSION 1 LEVEL;  Surgeon: [name], MD;  Location: [facility];  Service: Orthopedics;  Laterality: Left;  Left sided lumbar 3-4 lateral interbody fusion with allograft and instrumentation    Family History  Problem Relation Age of Onset  . Cancer Mother     colon  . Heart disease Father   . Diabetes Sister   . Hypertension Sister   . Hypertension Brother   . Heart disease Maternal Grandmother     heart attack    Social History Social History  Substance Use Topics  . Smoking status: Former Smoker -- 1.00 packs/day for 35 years    Types: Cigarettes    Quit date: 09/24/2011  . Smokeless tobacco: Never Used  . Alcohol Use: 0.0 oz/week    0 Standard drinks or equivalent per week     Comment: weekly    Current Outpatient Prescriptions  Medication Sig Dispense Refill  . Albuterol Sulfate (PROAIR RESPICLICK) 948 (90 BASE) MCG/ACT AEPB Inhale 2 puffs into the lungs every 4 (four) hours as needed. 1 each 11  . budesonide-formoterol (SYMBICORT) 80-4.5 MCG/ACT  inhaler Inhale 2 puffs into the lungs 2 (two) times daily. 1 Inhaler 5  . clonazePAM (KLONOPIN) 1 MG tablet Take 1/2 tablet as needed twice a day for anxiety and 1-2 tablets at night as needed for sleep 60 tablet 0  . dexlansoprazole (DEXILANT) 60 MG capsule Take 1 capsule (60 mg total) by mouth daily. 30 capsule 12  . Dextromethorphan-Guaifenesin (MUCINEX DM MAXIMUM STRENGTH) 60-1200 MG TB12 Take 1 tablet by mouth every 12 (twelve) hours. 14 each 1  . HYDROcodone-acetaminophen (NORCO/VICODIN) 5-325 MG tablet Take 1 tablet by mouth every 6 (six) hours as needed for moderate pain. 60 tablet 0  . levocetirizine (XYZAL) 5 MG tablet Take 1 tablet (5 mg total) by mouth every evening. 30 tablet 11  . montelukast (SINGULAIR) 10 MG tablet Take 1 tablet (10 mg total) by mouth at bedtime. 30 tablet 3  . pravastatin (PRAVACHOL) 40 MG tablet Take 1 tablet (40 mg total) by mouth at bedtime. 90 tablet 0  . triamcinolone (NASACORT ALLERGY 24HR) 55 MCG/ACT AERO nasal inhaler Place 2 sprays into the nose at bedtime. 1 Inhaler 2   No current facility-administered medications for this visit.    Allergies  Allergen Reactions  . Aspirin Nausea And Vomiting  . Morphine And Related Itching    Review of Systems  Constitutional: Positive for fever. Negative for activity change, appetite change, fatigue and unexpected weight change.  HENT: Positive for sinus pressure and sore throat. Negative for trouble swallowing and voice change.   Eyes: Negative for visual disturbance.  Respiratory: Positive for cough, shortness of breath and wheezing.   Cardiovascular: Positive for palpitations. Negative for chest pain and leg swelling.  Gastrointestinal: Positive for abdominal pain (reflux) and constipation.  Genitourinary: Negative for hematuria and difficulty urinating.  Musculoskeletal: Positive for joint swelling and arthralgias.  Neurological: Negative for dizziness, syncope and headaches.  Hematological: Negative for  adenopathy. Does not bruise/bleed easily.  Psychiatric/Behavioral: The patient is nervous/anxious.     BP 150/100 mmHg  Pulse 100  Resp 20  Ht '5\' 9"'$  (1.753 m)  Wt 175 lb (79.379 kg)  BMI 25.83 kg/m2  SpO2 94% Physical Exam  Constitutional: He is oriented to person, place, and time. He appears well-developed and well-nourished. No distress.  HENT:  Head: Normocephalic and atraumatic.  Eyes: Conjunctivae and EOM are normal. Pupils are equal, round, and reactive  to light. No scleral icterus.  Neck: Neck supple. No thyromegaly present.  Cardiovascular: Normal rate, regular rhythm, normal heart sounds and intact distal pulses.  Exam reveals no gallop and no friction rub.   No murmur heard. Pulmonary/Chest: Effort normal and breath sounds normal. No respiratory distress. He has no wheezes. He has no rales.  Abdominal: Soft. He exhibits no distension. There is no tenderness.  Musculoskeletal: He exhibits no edema or tenderness.  Lymphadenopathy:    He has no cervical adenopathy.  Neurological: He is alert and oriented to person, place, and time. No cranial nerve deficit. He exhibits normal muscle tone.  Skin: Skin is warm and dry.  Vitals reviewed.    Diagnostic Tests: NUCLEAR MEDICINE PET SKULL BASE TO THIGH  TECHNIQUE: 10.1 mCi F-18 FDG was injected intravenously. Full-ring PET imaging was performed from the skull base to thigh after the radiotracer. CT data was obtained and used for attenuation correction and anatomic localization.  FASTING BLOOD GLUCOSE: Value: 105 Mg/dl  COMPARISON: None.  FINDINGS: NECK  No hypermetabolic lymph nodes in the neck. Asymmetric increased uptake within the right base of tongue/tonsillar region is identified. The SUV max is equal to 6.99.  CHEST  Advanced smoking related changes are identified within both lungs. Within the posterior aspect of the superior segment of right lower lobe there is a spiculated subpleural nodule  measuring 11 mm, image 73 of series 4. The SUV max associated with this nodule is equal to 9.76. No hypermetabolic hilar or mediastinal lymph nodes identified.  ABDOMEN/PELVIS  No abnormal hypermetabolic activity within the liver, pancreas, adrenal glands, or spleen. No hypermetabolic lymph nodes in the abdomen or pelvis.  SKELETON  No focal hypermetabolic activity to suggest skeletal metastasis.  IMPRESSION: 1. There is intense radiotracer uptake associated with the subpleural nodule in the superior segment of the right lower lobe. Findings are worrisome for primary bronchogenic carcinoma. No evidence for hypermetabolic hilar or mediastinal lymph node metastasis or distant metastatic disease. 2. Asymmetric increased uptake within the right tonsillar region is identified. Nonspecific in may be physiologic. In a patient that may have risk factors for head neck carcinoma correlation with direct visualization is advised to exclude the possibility of a base of tongue or tonsillar lesion. 3. Emphysema.   Electronically Signed  By: Kerby Moors M.D.  On: 05/03/2015 17:37 I personally reviewed his outside CT scan and his PET/CT and concur with findings as noted above.  Impression:  66 year old man with a history of tobacco abuse who has a spiculated nodule in the superior segment of his right lower lobe that is markedly hypermetabolic on PET CT with SUV of 9.7. This is highly suspicious for new primary bronchogenic carcinoma.  Infectious or inflammatory nodules are also in the differential although with that degree of hypermetabolism in nodule that size I think those are exceedingly unlikely.  I recommended to him that we proceed straight with surgical resection. A needle biopsy would not change my recommendation is a positive biopsy would be an indication for surgery and a negative biopsy could not be reliably counted on to rule out cancer.  My recommendation was to do a  right VATS, wedge resection, followed by a superior segmentectomy of the nodule is positive for cancer on frozen section. That would incorporate the lymph node dissection as well. I discussed the proposed operation in detail with Mr. Vanbeek and his family. They understand the general nature of the procedure, the incisions to be used, the need for general anesthesia,  drainage tubes postoperatively, the expected hospital stay, and the overall recovery. They understand that this appears to be early stage disease, but staging can change with surgical findings. They also understand that no guarantee of cure can be given. I informed him of the indications, risks, benefits, and alternatives. They understand the risk include, but are not limited to death, MI, DVT, PE, bleeding, possible need for transfusion, infection, prolonged air leak, cardiac arrhythmias, as well as the possibility of other unforeseeable complications.  I think a segmentectomy would be sufficient in his case due to the small size and peripheral nature of the lesion. His FEV1 was 1.7 in 2015. His exercise tolerance is good. He has adequate pulmonary reserve to tolerate a segmentectomy and does not need repeat pulmonary function testing.  Plan:  He accepts the risks and wishes to proceed. Right VATS, wedge resection, possible superior segmentectomy on Monday, 05/24/2015    [name], MD [facility] [phone]

## 2015-05-12 ENCOUNTER — Other Ambulatory Visit: Payer: Self-pay | Admitting: *Deleted

## 2015-05-12 DIAGNOSIS — R911 Solitary pulmonary nodule: Secondary | ICD-10-CM

## 2015-05-12 NOTE — Telephone Encounter (Signed)
LMTCB for [employer]

## 2015-05-12 NOTE — Telephone Encounter (Signed)
Jolene from Dr. Leonarda Salon office called stating she no longer needs that report that you were discussing.

## 2015-05-12 NOTE — Telephone Encounter (Signed)
LVM for Elijah Marshall to return call.

## 2015-05-13 NOTE — Telephone Encounter (Signed)
Will close message as nothing further is needed.

## 2015-05-20 ENCOUNTER — Other Ambulatory Visit: Payer: Self-pay

## 2015-05-20 ENCOUNTER — Encounter (HOSPITAL_COMMUNITY): Payer: Self-pay

## 2015-05-20 ENCOUNTER — Encounter (HOSPITAL_COMMUNITY)
Admission: RE | Admit: 2015-05-20 | Discharge: 2015-05-20 | Disposition: A | Discharge status: Home / Self Care | Payer: Managed Care, Other (non HMO) | Source: Ambulatory Visit | Attending: Thoracic Surgery (Cardiothoracic Vascular Surgery) | Admitting: Thoracic Surgery (Cardiothoracic Vascular Surgery)

## 2015-05-20 VITALS — BP 156/97 | HR 97 | Temp 98.0°F | Resp 20 | Ht 69.0 in | Wt 179.2 lb

## 2015-05-20 DIAGNOSIS — Z01812 Encounter for preprocedural laboratory examination: Secondary | ICD-10-CM | POA: Diagnosis not present

## 2015-05-20 DIAGNOSIS — Z0183 Encounter for blood typing: Secondary | ICD-10-CM | POA: Insufficient documentation

## 2015-05-20 DIAGNOSIS — Z01818 Encounter for other preprocedural examination: Secondary | ICD-10-CM | POA: Insufficient documentation

## 2015-05-20 DIAGNOSIS — R911 Solitary pulmonary nodule: Secondary | ICD-10-CM | POA: Diagnosis not present

## 2015-05-20 HISTORY — DX: Malignant (primary) neoplasm, unspecified: C80.1

## 2015-05-20 HISTORY — DX: Essential (primary) hypertension: I10

## 2015-05-20 HISTORY — DX: Depression, unspecified: F32.A

## 2015-05-20 HISTORY — DX: Major depressive disorder, single episode, unspecified: F32.9

## 2015-05-20 LAB — APTT: aPTT: 31 seconds (ref 24–37)

## 2015-05-20 LAB — TYPE AND SCREEN
ABO/RH(D): A POS
Antibody Screen: NEGATIVE

## 2015-05-20 LAB — COMPREHENSIVE METABOLIC PANEL
ALT: 27 U/L (ref 17–63)
AST: 29 U/L (ref 15–41)
Albumin: 4.2 g/dL (ref 3.5–5.0)
Alkaline Phosphatase: 90 U/L (ref 38–126)
Anion gap: 13 (ref 5–15)
BUN: <5 mg/dL — ABNORMAL LOW (ref 6–20)
CO2: 21 mmol/L — ABNORMAL LOW (ref 22–32)
Calcium: 9.4 mg/dL (ref 8.9–10.3)
Chloride: 106 mmol/L (ref 101–111)
Creatinine, Ser: 0.98 mg/dL (ref 0.61–1.24)
GFR calc Af Amer: >60 mL/min (ref >60)
GFR calc non Af Amer: >60 mL/min (ref >60)
Glucose, Bld: 107 mg/dL — ABNORMAL HIGH (ref 65–99)
Potassium: 4 mmol/L (ref 3.5–5.1)
Sodium: 140 mmol/L (ref 135–145)
Total Bilirubin: 0.4 mg/dL (ref 0.3–1.2)
Total Protein: 6.8 g/dL (ref 6.5–8.1)

## 2015-05-20 LAB — URINALYSIS, ROUTINE W REFLEX MICROSCOPIC
Bilirubin Urine: NEGATIVE
Glucose, UA: NEGATIVE mg/dL
Hgb urine dipstick: NEGATIVE
Ketones, ur: NEGATIVE mg/dL
Leukocytes, UA: NEGATIVE
Nitrite: NEGATIVE
Protein, ur: NEGATIVE mg/dL
Specific Gravity, Urine: 1.007 (ref 1.005–1.030)
pH: 6 (ref 5.0–8.0)

## 2015-05-20 LAB — BLOOD GAS, ARTERIAL
Acid-Base Excess: 0.9 mmol/L (ref 0.0–2.0)
Bicarbonate: 25 mEq/L — ABNORMAL HIGH (ref 20.0–24.0)
FIO2: 0.21
O2 Saturation: 94.1 %
Patient temperature: 98.6
TCO2: 26.2 mmol/L (ref 0–100)
pCO2 arterial: 39.8 mmHg (ref 35.0–45.0)
pH, Arterial: 7.414 (ref 7.350–7.450)
pO2, Arterial: 72.4 mmHg — ABNORMAL LOW (ref 80.0–100.0)

## 2015-05-20 LAB — PROTIME-INR
INR: 1.05 (ref 0.00–1.49)
Prothrombin Time: 13.9 seconds (ref 11.6–15.2)

## 2015-05-20 LAB — CBC
HCT: 46.3 % (ref 39.0–52.0)
Hemoglobin: 15.4 g/dL (ref 13.0–17.0)
MCH: 28.9 pg (ref 26.0–34.0)
MCHC: 33.3 g/dL (ref 30.0–36.0)
MCV: 87 fL (ref 78.0–100.0)
Platelets: 202 10*3/uL (ref 150–400)
RBC: 5.32 MIL/uL (ref 4.22–5.81)
RDW: 13.2 % (ref 11.5–15.5)
WBC: 5.9 10*3/uL (ref 4.0–10.5)

## 2015-05-20 LAB — SURGICAL PCR SCREEN
MRSA, PCR: NEGATIVE
Staphylococcus aureus: NEGATIVE

## 2015-05-20 NOTE — Progress Notes (Deleted)
PCP is Dr Allyn Kenner Cardiologist is Dr Domenic Polite last saw 05-07-15 States he was told to stop coumadin 05-29-15

## 2015-05-20 NOTE — Pre-Procedure Instructions (Signed)
Elijah Marshall  05/20/2015      [id number] [address] [place] [address]Phone: [phone]: [phone]   Your procedure is scheduled on  April 17.  [facility]  Call this number if you have problems the morning of surgery:  [phone]   Remember:  Do not eat food or drink liquids after midnight.  Take these medicines the morning of surgery with A SIP OF WATER Albuterol (Proair) inhaler if needed, Symbicort inhaler, Bring your inhalers with you on the day of surgery, Clonazepam (Klonopin) if needed, Dexlansoprazole (Dexilant), Hydrocodone (norco) if needed  Stop taking aspirin, Ibuprofen, Advil, Motrin, Aleve, BC's, Goody's, Herbal medications, Fish Oil   Do not wear jewelry, make-up or nail polish.  Do not wear lotions, powders, or perfumes.  You may wear deodorant.  Do not shave 48 hours prior to surgery.  Men may shave face and neck.  Do not bring valuables to the hospital.  [facility] or [facility].  Contacts, dentures or bridgework may not be worn into surgery.  Leave your suitcase in the car.  After surgery it may be brought to your room.  For patients admitted to the hospital, discharge time will be determined by your treatment team.  Patients discharged the day of surgery will not be allowed to drive home.    Special instructions:  [facility] - Preparing for Surgery  Before surgery, you can play an important role.  Because skin is not sterile, your skin needs to be as free of germs as possible.  You can reduce the number of germs on you skin by washing with CHG (chlorahexidine gluconate) soap before surgery.  CHG is an antiseptic cleaner which kills germs and bonds with the skin to continue killing germs even after washing.  Please DO NOT use if you have an allergy to CHG or antibacterial soaps.  If your skin becomes  reddened/irritated stop using the CHG and inform your nurse when you arrive at Short Stay.  Do not shave (including legs and underarms) for at least 48 hours prior to the first CHG shower.  You may shave your face.  Please follow these instructions carefully:   1.  Shower with CHG Soap the night before surgery and the                                morning of Surgery.  2.  If you choose to wash your hair, wash your hair first as usual with your       normal shampoo.  3.  After you shampoo, rinse your hair and body thoroughly to remove the                      Shampoo.  4.  Use CHG as you would any other liquid soap.  You can apply chg directly       to the skin and wash gently with scrungie or a clean washcloth.  5.  Apply the CHG Soap to your body ONLY FROM THE NECK DOWN.        Do not use on open wounds or open sores.  Avoid contact with your eyes,       ears, mouth and genitals (private parts).  Wash genitals (private parts)  with your normal soap.  6.  Wash thoroughly, paying special attention to the area where your surgery        will be performed.  7.  Thoroughly rinse your body with warm water from the neck down.  8.  DO NOT shower/wash with your normal soap after using and rinsing off       the CHG Soap.  9.  Pat yourself dry with a clean towel.            10.  Wear clean pajamas.            11.  Place clean sheets on your bed the night of your first shower and do not        sleep with pets.  Day of Surgery  Do not apply any lotions/deoderants the morning of surgery.  Please wear clean clothes to the hospital/surgery center.     Please read over the following fact sheets that you were given. Pain Booklet, Coughing and Deep Breathing, Blood Transfusion Information, MRSA Information and Surgical Site Infection Prevention

## 2015-05-20 NOTE — Progress Notes (Signed)
PCP is Dr. [name] Pulmonologist- Dr Melvyn Novas Denies seeing a cardiologist. Denies ever having a card cath, Stress test, echo.

## 2015-05-21 MED ORDER — DEXTROSE 5 % IV SOLN
1.5000 g | INTRAVENOUS | Status: AC
  Administered 2015-05-24: 1.5 g via INTRAVENOUS
  Filled 2015-05-21: qty 1.5

## 2015-05-21 NOTE — Progress Notes (Signed)
Notified by the OR that this patietn's surgery time had been moved up to 0730 on Monday. Pt called and informed and asked to arrive at 0530.

## 2015-05-23 NOTE — Anesthesia Preprocedure Evaluation (Addendum)
Anesthesia Evaluation  Patient identified by MRN, date of birth, ID band Patient awake    Reviewed: Allergy & Precautions, H&P , NPO status , Patient's Chart, lab work & pertinent test results  History of Anesthesia Complications Negative for: history of anesthetic complications  Airway Mallampati: I  TM Distance: >3 FB Neck ROM: Full    Dental  (+) Missing, Dental Advisory Given, Partial Upper   Pulmonary shortness of breath, COPD,  COPD inhaler, former smoker,    Pulmonary exam normal breath sounds clear to auscultation       Cardiovascular hypertension, Normal cardiovascular exam Rhythm:Regular Rate:Normal     Neuro/Psych PSYCHIATRIC DISORDERS Depression Chronic back pain    GI/Hepatic Neg liver ROS, GERD  Medicated and Controlled,  Endo/Other  negative endocrine ROS  Renal/GU negative Renal ROS     Musculoskeletal  (+) Arthritis , Osteoarthritis,    Abdominal   Peds  Hematology negative hematology ROS (+)   Anesthesia Other Findings   Reproductive/Obstetrics                            Anesthesia Physical Anesthesia Plan  ASA: III  Anesthesia Plan: General   Post-op Pain Management:    Induction: Intravenous  Airway Management Planned: Oral ETT and Double Lumen EBT  Additional Equipment: Arterial line, CVP and Ultrasound Guidance Line Placement  Intra-op Plan:   Post-operative Plan: Extubation in OR and Possible Post-op intubation/ventilation  Informed Consent: I have reviewed the patients History and Physical, chart, labs and discussed the procedure including the risks, benefits and alternatives for the proposed anesthesia with the patient or authorized representative who has indicated his/her understanding and acceptance.   Dental advisory given  Plan Discussed with: CRNA  Anesthesia Plan Comments: (Risks/benefits of general anesthesia discussed with patient including  risk of damage to teeth, lips, gum, and tongue, nausea/vomiting, allergic reactions to medications, and the possibility of heart attack, stroke and death.  All patient questions answered.  Patient wishes to proceed.)        Anesthesia Quick Evaluation

## 2015-05-24 ENCOUNTER — Encounter (HOSPITAL_COMMUNITY): Payer: Self-pay | Admitting: Certified Registered"

## 2015-05-24 ENCOUNTER — Encounter (HOSPITAL_COMMUNITY)
Admission: RE | Disposition: A | Discharge status: Home / Self Care | Payer: Self-pay | Source: Ambulatory Visit | Attending: Thoracic Surgery (Cardiothoracic Vascular Surgery)

## 2015-05-24 ENCOUNTER — Inpatient Hospital Stay (HOSPITAL_COMMUNITY): Payer: Medicare Other

## 2015-05-24 ENCOUNTER — Inpatient Hospital Stay (HOSPITAL_COMMUNITY): Payer: Medicare Other | Admitting: Anesthesiology

## 2015-05-24 ENCOUNTER — Inpatient Hospital Stay (HOSPITAL_COMMUNITY)
Admission: RE | Admit: 2015-05-24 | Discharge: 2015-05-31 | DRG: 164 | Disposition: A | Discharge status: Home / Self Care | Payer: Medicare Other | Source: Ambulatory Visit | Attending: Thoracic Surgery (Cardiothoracic Vascular Surgery) | Admitting: Thoracic Surgery (Cardiothoracic Vascular Surgery)

## 2015-05-24 DIAGNOSIS — K219 Gastro-esophageal reflux disease without esophagitis: Secondary | ICD-10-CM | POA: Diagnosis present

## 2015-05-24 DIAGNOSIS — J449 Chronic obstructive pulmonary disease, unspecified: Secondary | ICD-10-CM | POA: Diagnosis present

## 2015-05-24 DIAGNOSIS — Z902 Acquired absence of lung [part of]: Secondary | ICD-10-CM

## 2015-05-24 DIAGNOSIS — Z800 Family history of malignant neoplasm of digestive organs: Secondary | ICD-10-CM

## 2015-05-24 DIAGNOSIS — T8182XA Emphysema (subcutaneous) resulting from a procedure, initial encounter: Secondary | ICD-10-CM | POA: Diagnosis not present

## 2015-05-24 DIAGNOSIS — R0600 Dyspnea, unspecified: Secondary | ICD-10-CM

## 2015-05-24 DIAGNOSIS — Z4682 Encounter for fitting and adjustment of non-vascular catheter: Secondary | ICD-10-CM

## 2015-05-24 DIAGNOSIS — J9382 Other air leak: Secondary | ICD-10-CM | POA: Diagnosis not present

## 2015-05-24 DIAGNOSIS — E785 Hyperlipidemia, unspecified: Secondary | ICD-10-CM | POA: Diagnosis present

## 2015-05-24 DIAGNOSIS — Z8249 Family history of ischemic heart disease and other diseases of the circulatory system: Secondary | ICD-10-CM | POA: Diagnosis not present

## 2015-05-24 DIAGNOSIS — J939 Pneumothorax, unspecified: Secondary | ICD-10-CM | POA: Diagnosis not present

## 2015-05-24 DIAGNOSIS — Z888 Allergy status to other drugs, medicaments and biological substances status: Secondary | ICD-10-CM

## 2015-05-24 DIAGNOSIS — K5900 Constipation, unspecified: Secondary | ICD-10-CM | POA: Diagnosis present

## 2015-05-24 DIAGNOSIS — R0902 Hypoxemia: Secondary | ICD-10-CM | POA: Diagnosis not present

## 2015-05-24 DIAGNOSIS — Z885 Allergy status to narcotic agent status: Secondary | ICD-10-CM

## 2015-05-24 DIAGNOSIS — I10 Essential (primary) hypertension: Secondary | ICD-10-CM | POA: Diagnosis not present

## 2015-05-24 DIAGNOSIS — J90 Pleural effusion, not elsewhere classified: Secondary | ICD-10-CM | POA: Diagnosis not present

## 2015-05-24 DIAGNOSIS — R079 Chest pain, unspecified: Secondary | ICD-10-CM

## 2015-05-24 DIAGNOSIS — C3431 Malignant neoplasm of lower lobe, right bronchus or lung: Secondary | ICD-10-CM | POA: Diagnosis present

## 2015-05-24 DIAGNOSIS — Z87891 Personal history of nicotine dependence: Secondary | ICD-10-CM | POA: Diagnosis not present

## 2015-05-24 DIAGNOSIS — R911 Solitary pulmonary nodule: Secondary | ICD-10-CM

## 2015-05-24 DIAGNOSIS — Z7951 Long term (current) use of inhaled steroids: Secondary | ICD-10-CM | POA: Diagnosis not present

## 2015-05-24 DIAGNOSIS — J9811 Atelectasis: Secondary | ICD-10-CM | POA: Diagnosis not present

## 2015-05-24 DIAGNOSIS — R918 Other nonspecific abnormal finding of lung field: Secondary | ICD-10-CM

## 2015-05-24 HISTORY — PX: VIDEO ASSISTED THORACOSCOPY (VATS)/WEDGE RESECTION: SHX6174

## 2015-05-24 HISTORY — PX: NODE DISSECTION: SHX5269

## 2015-05-24 HISTORY — PX: SEGMENTECOMY: SHX5076

## 2015-05-24 LAB — GLUCOSE, CAPILLARY
Glucose-Capillary: 98 mg/dL (ref 65–99)
Glucose-Capillary: 96 mg/dL (ref 65–99)

## 2015-05-24 SURGERY — VIDEO ASSISTED THORACOSCOPY (VATS)/WEDGE RESECTION
Anesthesia: General | Site: Chest | Laterality: Right

## 2015-05-24 MED ORDER — LACTATED RINGERS IV SOLN
INTRAVENOUS | Status: DC | PRN
  Administered 2015-05-24 (×3): via INTRAVENOUS

## 2015-05-24 MED ORDER — FENTANYL CITRATE (PF) 100 MCG/2ML IJ SOLN
25.0000 ug | INTRAMUSCULAR | Status: DC | PRN
  Administered 2015-05-24: 50 ug via INTRAVENOUS
  Administered 2015-05-24 (×2): 25 ug via INTRAVENOUS

## 2015-05-24 MED ORDER — FENTANYL CITRATE (PF) 100 MCG/2ML IJ SOLN
INTRAMUSCULAR | Status: AC
  Filled 2015-05-24: qty 2

## 2015-05-24 MED ORDER — KCL IN DEXTROSE-NACL 20-5-0.9 MEQ/L-%-% IV SOLN
INTRAVENOUS | Status: DC
  Administered 2015-05-24 – 2015-05-25 (×3): via INTRAVENOUS
  Filled 2015-05-24 (×6): qty 1000

## 2015-05-24 MED ORDER — PHENYLEPHRINE HCL 10 MG/ML IJ SOLN
INTRAMUSCULAR | Status: DC | PRN
  Administered 2015-05-24 (×4): 80 ug via INTRAVENOUS

## 2015-05-24 MED ORDER — SENNOSIDES-DOCUSATE SODIUM 8.6-50 MG PO TABS
1.0000 | ORAL_TABLET | Freq: Every day | ORAL | Status: DC
  Administered 2015-05-25 – 2015-05-27 (×3): 1 via ORAL
  Filled 2015-05-24 (×4): qty 1

## 2015-05-24 MED ORDER — DIPHENHYDRAMINE HCL 12.5 MG/5ML PO ELIX: 12.5000 mg | ORAL_SOLUTION | Freq: Four times a day (QID) | ORAL | Status: DC | PRN

## 2015-05-24 MED ORDER — KETOROLAC TROMETHAMINE 30 MG/ML IJ SOLN
INTRAMUSCULAR | Status: AC
  Filled 2015-05-24: qty 1

## 2015-05-24 MED ORDER — SODIUM CHLORIDE 0.9% FLUSH: 9.0000 mL | INTRAVENOUS | Status: DC | PRN

## 2015-05-24 MED ORDER — FENTANYL 40 MCG/ML IV SOLN
INTRAVENOUS | Status: AC
  Administered 2015-05-24: 50 ug via INTRAVENOUS
  Administered 2015-05-24: 13:00:00 via INTRAVENOUS
  Administered 2015-05-24: 30 ug via INTRAVENOUS
  Administered 2015-05-24: 40 ug via INTRAVENOUS
  Administered 2015-05-25: 35 ug via INTRAVENOUS
  Administered 2015-05-25: 60 ug via INTRAVENOUS
  Administered 2015-05-25: 100 ug via INTRAVENOUS
  Administered 2015-05-25: 30 ug via INTRAVENOUS
  Administered 2015-05-25: 60 ug via INTRAVENOUS
  Administered 2015-05-26: 90 ug via INTRAVENOUS
  Administered 2015-05-26: 20:00:00 via INTRAVENOUS
  Administered 2015-05-26: 50 ug via INTRAVENOUS
  Administered 2015-05-26: 100 ug via INTRAVENOUS
  Administered 2015-05-26: 75 ug via INTRAVENOUS
  Administered 2015-05-26: 160 ug via INTRAVENOUS
  Administered 2015-05-27: 50 ug via INTRAVENOUS
  Administered 2015-05-27: 120 ug via INTRAVENOUS
  Administered 2015-05-27: 90 ug via INTRAVENOUS
  Administered 2015-05-27: 10 ug via INTRAVENOUS
  Filled 2015-05-24: qty 25

## 2015-05-24 MED ORDER — PROPOFOL 10 MG/ML IV BOLUS
INTRAVENOUS | Status: DC | PRN
  Administered 2015-05-24: 50 mg via INTRAVENOUS
  Administered 2015-05-24: 150 mg via INTRAVENOUS

## 2015-05-24 MED ORDER — ONDANSETRON HCL 4 MG/2ML IJ SOLN
INTRAMUSCULAR | Status: AC
  Filled 2015-05-24: qty 2

## 2015-05-24 MED ORDER — ACETAMINOPHEN 160 MG/5ML PO SOLN: 1000.0000 mg | Freq: Four times a day (QID) | ORAL | Status: AC

## 2015-05-24 MED ORDER — DEXTROSE 5 % IV SOLN
10.0000 mg | INTRAVENOUS | Status: DC | PRN
  Administered 2015-05-24: 20 ug/min via INTRAVENOUS

## 2015-05-24 MED ORDER — ROCURONIUM BROMIDE 50 MG/5ML IV SOLN
INTRAVENOUS | Status: AC
  Filled 2015-05-24: qty 2

## 2015-05-24 MED ORDER — MIDAZOLAM HCL 5 MG/5ML IJ SOLN
INTRAMUSCULAR | Status: DC | PRN
  Administered 2015-05-24: 2 mg via INTRAVENOUS

## 2015-05-24 MED ORDER — POTASSIUM CHLORIDE 10 MEQ/50ML IV SOLN: 10.0000 meq | Freq: Every day | INTRAVENOUS | Status: DC | PRN

## 2015-05-24 MED ORDER — INSULIN ASPART 100 UNIT/ML ~~LOC~~ SOLN: 0.0000 [IU] | Freq: Four times a day (QID) | SUBCUTANEOUS | Status: DC

## 2015-05-24 MED ORDER — ACETAMINOPHEN 10 MG/ML IV SOLN
1000.0000 mg | INTRAVENOUS | Status: AC
  Administered 2015-05-24: 1000 mg via INTRAVENOUS
  Filled 2015-05-24: qty 100

## 2015-05-24 MED ORDER — ONDANSETRON HCL 4 MG/2ML IJ SOLN
INTRAMUSCULAR | Status: DC | PRN
  Administered 2015-05-24: 4 mg via INTRAVENOUS

## 2015-05-24 MED ORDER — LIDOCAINE HCL (CARDIAC) 20 MG/ML IV SOLN
INTRAVENOUS | Status: DC | PRN
  Administered 2015-05-24: 100 mg via INTRAVENOUS

## 2015-05-24 MED ORDER — TRAMADOL HCL 50 MG PO TABS
50.0000 mg | ORAL_TABLET | Freq: Four times a day (QID) | ORAL | Status: DC | PRN
  Administered 2015-05-25 – 2015-05-30 (×14): 100 mg via ORAL
  Filled 2015-05-24 (×14): qty 2

## 2015-05-24 MED ORDER — BUPIVACAINE 0.5 % ON-Q PUMP SINGLE CATH 400 ML
INJECTION | Status: DC | PRN
  Administered 2015-05-24: 400 mL

## 2015-05-24 MED ORDER — TRIAMCINOLONE ACETONIDE 55 MCG/ACT NA AERO
2.0000 | INHALATION_SPRAY | Freq: Every day | NASAL | Status: DC
  Administered 2015-05-24 – 2015-05-30 (×6): 2 via NASAL
  Filled 2015-05-24 (×2): qty 21.6

## 2015-05-24 MED ORDER — MIDAZOLAM HCL 2 MG/2ML IJ SOLN
INTRAMUSCULAR | Status: AC
  Filled 2015-05-24: qty 2

## 2015-05-24 MED ORDER — ONDANSETRON HCL 4 MG/2ML IJ SOLN: 4.0000 mg | Freq: Four times a day (QID) | INTRAMUSCULAR | Status: DC | PRN

## 2015-05-24 MED ORDER — KETOROLAC TROMETHAMINE 30 MG/ML IJ SOLN
INTRAMUSCULAR | Status: DC | PRN
  Administered 2015-05-24: 30 mg via INTRAVENOUS

## 2015-05-24 MED ORDER — ROCURONIUM BROMIDE 100 MG/10ML IV SOLN
INTRAVENOUS | Status: DC | PRN
  Administered 2015-05-24: 5 mg via INTRAVENOUS
  Administered 2015-05-24: 50 mg via INTRAVENOUS
  Administered 2015-05-24 (×3): 10 mg via INTRAVENOUS

## 2015-05-24 MED ORDER — SUCCINYLCHOLINE CHLORIDE 20 MG/ML IJ SOLN
INTRAMUSCULAR | Status: AC
  Filled 2015-05-24: qty 1

## 2015-05-24 MED ORDER — PROPOFOL 10 MG/ML IV BOLUS
INTRAVENOUS | Status: AC
  Filled 2015-05-24: qty 20

## 2015-05-24 MED ORDER — FENTANYL 40 MCG/ML IV SOLN
INTRAVENOUS | Status: AC
  Filled 2015-05-24: qty 25

## 2015-05-24 MED ORDER — BISACODYL 5 MG PO TBEC
10.0000 mg | DELAYED_RELEASE_TABLET | Freq: Every day | ORAL | Status: DC
  Administered 2015-05-24 – 2015-05-30 (×6): 10 mg via ORAL
  Filled 2015-05-24 (×7): qty 2

## 2015-05-24 MED ORDER — BUPIVACAINE 0.5 % ON-Q PUMP SINGLE CATH 400 ML
400.0000 mL | INJECTION | Status: DC
  Filled 2015-05-24 (×2): qty 400

## 2015-05-24 MED ORDER — SUGAMMADEX SODIUM 200 MG/2ML IV SOLN
INTRAVENOUS | Status: DC | PRN
  Administered 2015-05-24: 200 mg via INTRAVENOUS

## 2015-05-24 MED ORDER — BUPIVACAINE HCL (PF) 0.5 % IJ SOLN
INTRAMUSCULAR | Status: AC
  Filled 2015-05-24: qty 10

## 2015-05-24 MED ORDER — LIDOCAINE HCL (CARDIAC) 20 MG/ML IV SOLN
INTRAVENOUS | Status: AC
  Filled 2015-05-24: qty 5

## 2015-05-24 MED ORDER — DEXTROSE 5 % IV SOLN
1.5000 g | Freq: Two times a day (BID) | INTRAVENOUS | Status: AC
  Administered 2015-05-24 – 2015-05-25 (×2): 1.5 g via INTRAVENOUS
  Filled 2015-05-24 (×2): qty 1.5

## 2015-05-24 MED ORDER — PRAVASTATIN SODIUM 40 MG PO TABS
40.0000 mg | ORAL_TABLET | Freq: Every day | ORAL | Status: DC
  Administered 2015-05-24 – 2015-05-30 (×7): 40 mg via ORAL
  Filled 2015-05-24 (×7): qty 1

## 2015-05-24 MED ORDER — BUPIVACAINE HCL (PF) 0.5 % IJ SOLN
INTRAMUSCULAR | Status: DC | PRN
  Administered 2015-05-24: 10 mL

## 2015-05-24 MED ORDER — SUGAMMADEX SODIUM 200 MG/2ML IV SOLN
INTRAVENOUS | Status: AC
  Filled 2015-05-24: qty 2

## 2015-05-24 MED ORDER — LORATADINE 10 MG PO TABS
5.0000 mg | ORAL_TABLET | Freq: Every evening | ORAL | Status: DC
  Administered 2015-05-25 – 2015-05-30 (×6): 5 mg via ORAL
  Filled 2015-05-24 (×6): qty 1

## 2015-05-24 MED ORDER — PANTOPRAZOLE SODIUM 40 MG PO TBEC
40.0000 mg | DELAYED_RELEASE_TABLET | Freq: Every day | ORAL | Status: DC
  Administered 2015-05-25 – 2015-05-31 (×7): 40 mg via ORAL
  Filled 2015-05-24 (×8): qty 1

## 2015-05-24 MED ORDER — MONTELUKAST SODIUM 10 MG PO TABS
10.0000 mg | ORAL_TABLET | Freq: Every day | ORAL | Status: DC
  Administered 2015-05-24 – 2015-05-30 (×7): 10 mg via ORAL
  Filled 2015-05-24 (×8): qty 1

## 2015-05-24 MED ORDER — ACETAMINOPHEN 500 MG PO TABS
1000.0000 mg | ORAL_TABLET | Freq: Four times a day (QID) | ORAL | Status: AC
  Administered 2015-05-24 – 2015-05-29 (×19): 1000 mg via ORAL
  Filled 2015-05-24 (×19): qty 2

## 2015-05-24 MED ORDER — DIPHENHYDRAMINE HCL 50 MG/ML IJ SOLN: 12.5000 mg | Freq: Four times a day (QID) | INTRAMUSCULAR | Status: DC | PRN

## 2015-05-24 MED ORDER — MOMETASONE FURO-FORMOTEROL FUM 100-5 MCG/ACT IN AERO
2.0000 | INHALATION_SPRAY | Freq: Two times a day (BID) | RESPIRATORY_TRACT | Status: DC
  Administered 2015-05-24 – 2015-05-31 (×13): 2 via RESPIRATORY_TRACT
  Filled 2015-05-24 (×2): qty 8.8

## 2015-05-24 MED ORDER — NALOXONE HCL 0.4 MG/ML IJ SOLN: 0.4000 mg | INTRAMUSCULAR | Status: DC | PRN

## 2015-05-24 MED ORDER — CLONAZEPAM 0.5 MG PO TABS
0.5000 mg | ORAL_TABLET | Freq: Two times a day (BID) | ORAL | Status: DC | PRN
  Administered 2015-05-27: 0.5 mg via ORAL
  Filled 2015-05-24: qty 1

## 2015-05-24 MED ORDER — FENTANYL CITRATE (PF) 100 MCG/2ML IJ SOLN
INTRAMUSCULAR | Status: DC | PRN
  Administered 2015-05-24 (×2): 50 ug via INTRAVENOUS
  Administered 2015-05-24: 100 ug via INTRAVENOUS
  Administered 2015-05-24: 50 ug via INTRAVENOUS

## 2015-05-24 MED ORDER — FENTANYL CITRATE (PF) 250 MCG/5ML IJ SOLN
INTRAMUSCULAR | Status: AC
  Filled 2015-05-24: qty 5

## 2015-05-24 MED ORDER — PHENYLEPHRINE 40 MCG/ML (10ML) SYRINGE FOR IV PUSH (FOR BLOOD PRESSURE SUPPORT)
PREFILLED_SYRINGE | INTRAVENOUS | Status: AC
  Filled 2015-05-24: qty 10

## 2015-05-24 MED ORDER — ONDANSETRON HCL 4 MG/2ML IJ SOLN: 4.0000 mg | Freq: Once | INTRAMUSCULAR | Status: DC | PRN

## 2015-05-24 MED ORDER — 0.9 % SODIUM CHLORIDE (POUR BTL) OPTIME
TOPICAL | Status: DC | PRN
  Administered 2015-05-24: 2000 mL

## 2015-05-24 SURGICAL SUPPLY — 71 items
ADH SKN CLS APL DERMABOND .7 (GAUZE/BANDAGES/DRESSINGS) ×1
BAG SPEC RTRVL LRG 6X4 10 (ENDOMECHANICALS) ×1
CANISTER SUCTION 2500CC (MISCELLANEOUS) ×4 IMPLANT
CATH KIT ON-Q SILVERSOAK 5 (CATHETERS) IMPLANT
CATH KIT ON-Q SILVERSOAK 5IN (CATHETERS) ×3 IMPLANT
CATH THORACIC 28FR (CATHETERS) ×2 IMPLANT
CLIP TI MEDIUM 6 (CLIP) ×3 IMPLANT
CONN ST 1/4X3/8  BEN (MISCELLANEOUS) ×2
CONN ST 1/4X3/8 BEN (MISCELLANEOUS) IMPLANT
CONT SPEC 4OZ CLIKSEAL STRL BL (MISCELLANEOUS) ×22 IMPLANT
COVER SURGICAL LIGHT HANDLE (MISCELLANEOUS) ×3 IMPLANT
DERMABOND ADVANCED (GAUZE/BANDAGES/DRESSINGS) ×2
DERMABOND ADVANCED .7 DNX12 (GAUZE/BANDAGES/DRESSINGS) IMPLANT
DRAIN CHANNEL 28F RND 3/8 FF (WOUND CARE) ×2 IMPLANT
DRAPE LAPAROSCOPIC ABDOMINAL (DRAPES) ×3 IMPLANT
DRAPE WARM FLUID 44X44 (DRAPE) ×3 IMPLANT
ELECT BLADE 6.5 EXT (BLADE) ×3 IMPLANT
ELECT REM PT RETURN 9FT ADLT (ELECTROSURGICAL) ×3
ELECTRODE REM PT RTRN 9FT ADLT (ELECTROSURGICAL) ×1 IMPLANT
GAUZE SPONGE 4X4 12PLY STRL (GAUZE/BANDAGES/DRESSINGS) ×3 IMPLANT
GLOVE BIO SURGEON STRL SZ 6.5 (GLOVE) ×1 IMPLANT
GLOVE BIO SURGEONS STRL SZ 6.5 (GLOVE) ×1
GLOVE BIOGEL PI IND STRL 6.5 (GLOVE) IMPLANT
GLOVE BIOGEL PI IND STRL 7.0 (GLOVE) IMPLANT
GLOVE BIOGEL PI INDICATOR 6.5 (GLOVE) ×2
GLOVE BIOGEL PI INDICATOR 7.0 (GLOVE) ×2
GLOVE SURG SIGNA 7.5 PF LTX (GLOVE) ×6 IMPLANT
GLOVE SURG SS PI 6.5 STRL IVOR (GLOVE) ×2 IMPLANT
GOWN STRL REUS W/ TWL LRG LVL3 (GOWN DISPOSABLE) ×2 IMPLANT
GOWN STRL REUS W/ TWL XL LVL3 (GOWN DISPOSABLE) ×2 IMPLANT
GOWN STRL REUS W/TWL LRG LVL3 (GOWN DISPOSABLE) ×6
GOWN STRL REUS W/TWL XL LVL3 (GOWN DISPOSABLE) ×3
HEMOSTAT SURGICEL 2X14 (HEMOSTASIS) ×2 IMPLANT
KIT BASIN OR (CUSTOM PROCEDURE TRAY) ×3 IMPLANT
KIT ROOM TURNOVER OR (KITS) ×3 IMPLANT
NS IRRIG 1000ML POUR BTL (IV SOLUTION) ×7 IMPLANT
PACK CHEST (CUSTOM PROCEDURE TRAY) ×3 IMPLANT
PAD ARMBOARD 7.5X6 YLW CONV (MISCELLANEOUS) ×6 IMPLANT
POUCH SPECIMEN RETRIEVAL 10MM (ENDOMECHANICALS) ×2 IMPLANT
RELOAD STAPLE 35X2.5 WHT THIN (STAPLE) IMPLANT
RELOAD STAPLE 60 3.8 GOLD REG (STAPLE) IMPLANT
RELOAD STAPLE 60 4.1 GRN THCK (STAPLE) IMPLANT
RELOAD STAPLER GOLD 60MM (STAPLE) ×8 IMPLANT
RELOAD STAPLER GREEN 60MM (STAPLE) ×1 IMPLANT
SEALANT PROGEL (MISCELLANEOUS) ×2 IMPLANT
SOLUTION ANTI FOG 6CC (MISCELLANEOUS) ×3 IMPLANT
SPONGE GAUZE 4X4 12PLY STER LF (GAUZE/BANDAGES/DRESSINGS) ×2 IMPLANT
SPONGE INTESTINAL PEANUT (DISPOSABLE) ×2 IMPLANT
SPONGE TONSIL 1 RF SGL (DISPOSABLE) ×3 IMPLANT
STAPLE ECHEON FLEX 60 POW ENDO (STAPLE) ×2 IMPLANT
STAPLE RELOAD 2.5MM WHITE (STAPLE) ×3 IMPLANT
STAPLER RELOAD GOLD 60MM (STAPLE) ×24
STAPLER RELOAD GREEN 60MM (STAPLE) ×3
STAPLER VASCULAR ECHELON 35 (CUTTER) ×2 IMPLANT
SUT SILK  1 MH (SUTURE) ×4
SUT SILK 1 MH (SUTURE) ×1 IMPLANT
SUT SILK 1 TIES 10X30 (SUTURE) ×3 IMPLANT
SUT SILK 2 0 SH (SUTURE) ×2 IMPLANT
SUT SILK 2 0SH CR/8 30 (SUTURE) ×2 IMPLANT
SUT VIC AB 1 CTX 27 (SUTURE) ×3 IMPLANT
SUT VIC AB 2-0 CTX 36 (SUTURE) ×3 IMPLANT
SUT VIC AB 3-0 MH 27 (SUTURE) ×2 IMPLANT
SUT VIC AB 3-0 X1 27 (SUTURE) ×3 IMPLANT
SYR CONTROL 10ML LL (SYRINGE) ×2 IMPLANT
SYSTEM SAHARA CHEST DRAIN ATS (WOUND CARE) ×3 IMPLANT
TAPE CLOTH SURG 6X10 WHT LF (GAUZE/BANDAGES/DRESSINGS) ×2 IMPLANT
TIP APPLICATOR SPRAY EXTEND 16 (VASCULAR PRODUCTS) ×2 IMPLANT
TRAY FOLEY CATH 16FRSI W/METER (SET/KITS/TRAYS/PACK) ×3 IMPLANT
TROCAR XCEL BLADELESS 5X75MML (TROCAR) ×3 IMPLANT
TUNNELER SHEATH ON-Q 11GX8 DSP (PAIN MANAGEMENT) ×2 IMPLANT
WATER STERILE IRR 1000ML POUR (IV SOLUTION) ×3 IMPLANT

## 2015-05-24 NOTE — Transfer of Care (Signed)
Immediate Anesthesia Transfer of Care Note  Patient: Elijah Marshall  Procedure(s) Performed: Procedure(s): RIGHT VIDEO ASSISTED THORACOSCOPY (VATS)/ RIGHTR LOWER LOBE WEDGE RESECTION (Right) RIGHT LOWER LOBE SUPERIOR SEGMENTECTOMY (Right) NODE DISSECTION (Right)  Patient Location: PACU  Anesthesia Type:General  Level of Consciousness: awake, alert , oriented and patient cooperative  Airway & Oxygen Therapy: Patient Spontanous Breathing and Patient connected to nasal cannula oxygen  Post-op Assessment: Report given to RN, Post -op Vital signs reviewed and stable and Patient moving all extremities  Post vital signs: Reviewed and stable  Last Vitals:  Filed Vitals:   05/24/15 0600  BP: 171/97  Pulse: 90  Temp: 36.8 C  Resp: 18    Complications: No apparent anesthesia complications

## 2015-05-24 NOTE — Anesthesia Procedure Notes (Addendum)
Procedure Name: Intubation Date/Time: 05/24/2015 7:37 AM Performed by: Myna Bright Pre-anesthesia Checklist: Patient identified, Emergency Drugs available, Suction available and Patient being monitored Patient Re-evaluated:Patient Re-evaluated prior to inductionOxygen Delivery Method: Circle system utilized Preoxygenation: Pre-oxygenation with 100% oxygen Intubation Type: IV induction Ventilation: Mask ventilation without difficulty Laryngoscope Size: Mac and 4 Grade View: Grade I Tube type: Oral Endobronchial tube: Left, Double lumen EBT, EBT position confirmed by auscultation and EBT position confirmed by fiberoptic bronchoscope and 39 Fr Number of attempts: 1 Airway Equipment and Method: Stylet Placement Confirmation: ETT inserted through vocal cords under direct vision,  positive ETCO2 and breath sounds checked- equal and bilateral Secured at: 30 cm Tube secured with: Tape Dental Injury: Teeth and Oropharynx as per pre-operative assessment     Central Venous Catheter Insertion Performed by: anesthesiologist Patient location: Pre-op. Preanesthetic checklist: patient identified, IV checked, site marked, risks and benefits discussed, surgical consent, monitors and equipment checked, pre-op evaluation, timeout performed and anesthesia consent Lidocaine 1% used for infiltration Landmarks identified and Seldinger technique used Catheter size: 8 Fr Central line was placed.Double lumen Procedure performed using ultrasound guided technique. Attempts: 1 Following insertion, dressing applied, line sutured and Biopatch. Post procedure assessment: blood return through all ports, free fluid flow and no air. Patient tolerated the procedure well with no immediate complications.

## 2015-05-24 NOTE — Interval H&P Note (Signed)
History and Physical Interval Note:  05/24/2015 7:14 AM  [name]  has presented today for surgery, with the diagnosis of RLL NODULE  The various methods of treatment have been discussed with the patient and family. After consideration of risks, benefits and other options for treatment, the patient has consented to  Procedure(s): VIDEO ASSISTED THORACOSCOPY (VATS)/WEDGE RESECTION (Right) POSSIBLE SUPERIOR SEGMENTECTOMY (Right) as a surgical intervention .  The patient's history has been reviewed, patient examined, no change in status, stable for surgery.  I have reviewed the patient's chart and labs.  Questions were answered to the patient's satisfaction.     Elijah Marshall

## 2015-05-24 NOTE — Anesthesia Postprocedure Evaluation (Signed)
Anesthesia Post Note  Patient: CONNELLY [place]  Procedure(s) Performed: Procedure(s) (LRB): RIGHT VIDEO ASSISTED THORACOSCOPY (VATS)/ RIGHTR LOWER LOBE WEDGE RESECTION (Right) RIGHT LOWER LOBE SUPERIOR SEGMENTECTOMY (Right) NODE DISSECTION (Right)  Patient location during evaluation: PACU Anesthesia Type: General Level of consciousness: awake and alert, oriented, patient cooperative and awake Pain management: pain level controlled Vital Signs Assessment: post-procedure vital signs reviewed and stable Respiratory status: spontaneous breathing, nonlabored ventilation, respiratory function stable and patient connected to nasal cannula oxygen Cardiovascular status: blood pressure returned to baseline and stable Postop Assessment: no signs of nausea or vomiting Anesthetic complications: no Comments: CVL looks good on CXR. No pneumothorax.    Last Vitals:  Filed Vitals:   05/24/15 1145 05/24/15 1232  BP:    Pulse: 80   Temp:    Resp: 14 15    Last Pain:  Filed Vitals:   05/24/15 1237  PainSc: 0-No pain                 Catalina Gravel

## 2015-05-24 NOTE — Progress Notes (Signed)
CT surgery p.m. Rounds  Status post right VATS, right lower lobe superior segmentectomy Mild air leak with cough, minimal serosanguineous drainage Oxygen saturation 94% Pain fairly well controlled with PCA Doing well postop VATS

## 2015-05-24 NOTE — H&P (View-Only) (Signed)
Elijah Marshall       [place] [address]             [phone]      PCP is Elijah Cheadle, MD Referring Provider is Elijah Rockers, MD  Chief Complaint  Patient presents with  . Lung Lesion    Surgical eval, PET Scan 05/03/15,Chest CT 04/08/15 '@[facility]  healthcare,  CXR 03/29/15    HPI: Elijah Marshall is sent for consultation regarding a right upper lobe nodule.  Elijah Marshall is a 65 year old man with a history of tobacco abuse, COPD, hyperlipidemia, and gastroesophageal reflux. He recently developed a dry cough and some wheezing. He was afraid that he was coming down with bronchitis and went to see Elijah Marshall. A chest x-ray showed a right lung nodule. A CT of the chest confirmed a 1.3 cm spiculated nodule in the superior segment of the right lower lobe. There was no suspicious hilar or mediastinal adenopathy. A PET CT showed the right upper lobe nodule was hypermetabolic with an SUV of 9.7. There was no hilar or mediastinal adenopathy. There was some activity in the right tonsillar area.  He had before we discussed the results of the CT and PET/CT that he had had a sore throat recently. That has resolved. He says he gets short of breath with heavy exertion such as walking up an incline or when he carries heavy objects. He walks about a mile and a half daily. 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 He has had a productive cough but denies hemoptysis. He does have occasional wheezing. He complains of frequent heartburn. He has not had any anginal-like chest pain pressure or tightness. His weight is stable and his appetite is good. He's not had any unusual headaches or visual changes. He is disabled due to multiple previous back surgeries and knee replacement.  Zubrod Score: At the time of surgery this patient's most appropriate activity status/level should be described as: '[x]'$     0    Normal activity, no  symptoms '[]'$     1    Restricted in physical strenuous activity but ambulatory, able to do out light work '[]'$     2    Ambulatory and capable of self care, unable to do work activities, up and about >50 % of waking hours                              '[]'$     3    Only limited self care, in bed greater than 50% of waking hours '[]'$     4    Completely disabled, no self care, confined to bed or chair '[]'$     5    Moribund   Past Medical History  Diagnosis Date  . Allergy   . Ulcer   . COPD (chronic obstructive pulmonary disease) ([place])     per patient's health survey - he put a ?  . GERD (gastroesophageal reflux disease)   . Arthritis   . Shortness of breath     Past Surgical History  Procedure Laterality Date  . Spine surgery  2012  . Colonoscopy  14    polyps rem  . Joint replacement Right 2010  . Anterior fusion lumbar spine  07/25/2012  . Anterior lat lumbar fusion Left 07/25/2012  Procedure: ANTERIOR LATERAL LUMBAR FUSION 1 LEVEL;  Surgeon: Elijah Ship, MD;  Location: [facility];  Service: Orthopedics;  Laterality: Left;  Left sided lumbar 3-4 lateral interbody fusion with allograft and instrumentation    Family History  Problem Relation Age of Onset  . Cancer Mother     colon  . Heart disease Father   . Diabetes Sister   . Hypertension Sister   . Hypertension Brother   . Heart disease Maternal Grandmother     heart attack    Social History Social History  Substance Use Topics  . Smoking status: Former Smoker -- 1.00 packs/day for 35 years    Types: Cigarettes    Quit date: 09/24/2011  . Smokeless tobacco: Never Used  . Alcohol Use: 0.0 oz/week    0 Standard drinks or equivalent per week     Comment: weekly    Current Outpatient Prescriptions  Medication Sig Dispense Refill  . Albuterol Sulfate (PROAIR RESPICLICK) 527 (90 BASE) MCG/ACT AEPB Inhale 2 puffs into the lungs every 4 (four) hours as needed. 1 each 11  . budesonide-formoterol (SYMBICORT) 80-4.5 MCG/ACT  inhaler Inhale 2 puffs into the lungs 2 (two) times daily. 1 Inhaler 5  . clonazePAM (KLONOPIN) 1 MG tablet Take 1/2 tablet as needed twice a day for anxiety and 1-2 tablets at night as needed for sleep 60 tablet 0  . dexlansoprazole (DEXILANT) 60 MG capsule Take 1 capsule (60 mg total) by mouth daily. 30 capsule 12  . Dextromethorphan-Guaifenesin (MUCINEX DM MAXIMUM STRENGTH) 60-1200 MG TB12 Take 1 tablet by mouth every 12 (twelve) hours. 14 each 1  . HYDROcodone-acetaminophen (NORCO/VICODIN) 5-325 MG tablet Take 1 tablet by mouth every 6 (six) hours as needed for moderate pain. 60 tablet 0  . levocetirizine (XYZAL) 5 MG tablet Take 1 tablet (5 mg total) by mouth every evening. 30 tablet 11  . montelukast (SINGULAIR) 10 MG tablet Take 1 tablet (10 mg total) by mouth at bedtime. 30 tablet 3  . pravastatin (PRAVACHOL) 40 MG tablet Take 1 tablet (40 mg total) by mouth at bedtime. 90 tablet 0  . triamcinolone (NASACORT ALLERGY 24HR) 55 MCG/ACT AERO nasal inhaler Place 2 sprays into the nose at bedtime. 1 Inhaler 2   No current facility-administered medications for this visit.    Allergies  Allergen Reactions  . Aspirin Nausea And Vomiting  . Morphine And Related Itching    Review of Systems  Constitutional: Positive for fever. Negative for activity change, appetite change, fatigue and unexpected weight change.  HENT: Positive for sinus pressure and sore throat. Negative for trouble swallowing and voice change.   Eyes: Negative for visual disturbance.  Respiratory: Positive for cough, shortness of breath and wheezing.   Cardiovascular: Positive for palpitations. Negative for chest pain and leg swelling.  Gastrointestinal: Positive for abdominal pain (reflux) and constipation.  Genitourinary: Negative for hematuria and difficulty urinating.  Musculoskeletal: Positive for joint swelling and arthralgias.  Neurological: Negative for dizziness, syncope and headaches.  Hematological: Negative for  adenopathy. Does not bruise/bleed easily.  Psychiatric/Behavioral: The patient is nervous/anxious.     BP 150/100 mmHg  Pulse 100  Resp 20  Ht '5\' 9"'$  (1.753 m)  Wt 175 lb (79.379 kg)  BMI 25.83 kg/m2  SpO2 94% Physical Exam  Constitutional: He is oriented to person, place, and time. He appears well-developed and well-nourished. No distress.  HENT:  Head: Normocephalic and atraumatic.  Eyes: Conjunctivae and EOM are normal. Pupils are equal, round, and reactive  to light. No scleral icterus.  Neck: Neck supple. No thyromegaly present.  Cardiovascular: Normal rate, regular rhythm, normal heart sounds and intact distal pulses.  Exam reveals no gallop and no friction rub.   No murmur heard. Pulmonary/Chest: Effort normal and breath sounds normal. No respiratory distress. He has no wheezes. He has no rales.  Abdominal: Soft. He exhibits no distension. There is no tenderness.  Musculoskeletal: He exhibits no edema or tenderness.  Lymphadenopathy:    He has no cervical adenopathy.  Neurological: He is alert and oriented to person, place, and time. No cranial nerve deficit. He exhibits normal muscle tone.  Skin: Skin is warm and dry.  Vitals reviewed.    Diagnostic Tests: NUCLEAR MEDICINE PET SKULL BASE TO THIGH  TECHNIQUE: 10.1 mCi F-18 FDG was injected intravenously. Full-ring PET imaging was performed from the skull base to thigh after the radiotracer. CT data was obtained and used for attenuation correction and anatomic localization.  FASTING BLOOD GLUCOSE: Value: 105 Mg/dl  COMPARISON: None.  FINDINGS: NECK  No hypermetabolic lymph nodes in the neck. Asymmetric increased uptake within the right base of tongue/tonsillar region is identified. The SUV max is equal to 6.99.  CHEST  Advanced smoking related changes are identified within both lungs. Within the posterior aspect of the superior segment of right lower lobe there is a spiculated subpleural nodule  measuring 11 mm, image 73 of series 4. The SUV max associated with this nodule is equal to 9.76. No hypermetabolic hilar or mediastinal lymph nodes identified.  ABDOMEN/PELVIS  No abnormal hypermetabolic activity within the liver, pancreas, adrenal glands, or spleen. No hypermetabolic lymph nodes in the abdomen or pelvis.  SKELETON  No focal hypermetabolic activity to suggest skeletal metastasis.  IMPRESSION: 1. There is intense radiotracer uptake associated with the subpleural nodule in the superior segment of the right lower lobe. Findings are worrisome for primary bronchogenic carcinoma. No evidence for hypermetabolic hilar or mediastinal lymph node metastasis or distant metastatic disease. 2. Asymmetric increased uptake within the right tonsillar region is identified. Nonspecific in may be physiologic. In a patient that may have risk factors for head neck carcinoma correlation with direct visualization is advised to exclude the possibility of a base of tongue or tonsillar lesion. 3. Emphysema.   Electronically Signed  By: Elijah Marshall M.D.  On: 05/03/2015 17:37 I personally reviewed his outside CT scan and his PET/CT and concur with findings as noted above.  Impression:  65 year old man with a history of tobacco abuse who has a spiculated nodule in the superior segment of his right lower lobe that is markedly hypermetabolic on PET CT with SUV of 9.7. This is highly suspicious for new primary bronchogenic carcinoma.  Infectious or inflammatory nodules are also in the differential although with that degree of hypermetabolism in nodule that size I think those are exceedingly unlikely.  I recommended to him that we proceed straight with surgical resection. A needle biopsy would not change my recommendation is a positive biopsy would be an indication for surgery and a negative biopsy could not be reliably counted on to rule out cancer.  My recommendation was to do a  right VATS, wedge resection, followed by a superior segmentectomy of the nodule is positive for cancer on frozen section. That would incorporate the lymph node dissection as well. I discussed the proposed operation in detail with Elijah Marshall and his family. They understand the general nature of the procedure, the incisions to be used, the need for general anesthesia,  drainage tubes postoperatively, the expected hospital stay, and the overall recovery. They understand that this appears to be early stage disease, but staging can change with surgical findings. They also understand that no guarantee of cure can be given. I informed him of the indications, risks, benefits, and alternatives. They understand the risk include, but are not limited to death, MI, DVT, PE, bleeding, possible need for transfusion, infection, prolonged air leak, cardiac arrhythmias, as well as the possibility of other unforeseeable complications.  I think a segmentectomy would be sufficient in his case due to the small size and peripheral nature of the lesion. His FEV1 was 1.7 in 2015. His exercise tolerance is good. He has adequate pulmonary reserve to tolerate a segmentectomy and does not need repeat pulmonary function testing.  Plan:  He accepts the risks and wishes to proceed. Right VATS, wedge resection, possible superior segmentectomy on Monday, 05/24/2015    [name], MD [facility] [phone]

## 2015-05-24 NOTE — Brief Op Note (Addendum)
05/24/2015  10:35 AM  PATIENT:  Elijah Marshall  65 y.o. male  PRE-OPERATIVE DIAGNOSIS:  RLL NODULE  POST-OPERATIVE DIAGNOSIS:  SQUAMOUS CELL CARCINOMA RLL- Clinical stage IB  PROCEDURE:   RIGHT VIDEO ASSISTED THORACOSCOPY (VATS),  RIGHT LOWER LOBE WEDGE RESECTION, BIOPSY RML, THORACOSCOPIC RIGHT LOWER LOBE SUPERIOR SEGMENTECTOMY, MEDIASTINAL LYMPH NODE DISSECTION, On Q LOCAL ANESTHETIC CATHETER PLACEMENT  SURGEON:  Surgeon(s) and Role:    * [name], MD - Primary  PHYSICIAN ASSISTANT: Lars Pinks PA-C  ANESTHESIA:   general  EBL:  Total I/O In: 1800 [I.V.:1800] Out: 300 [Urine:250; Blood:50]  BLOOD ADMINISTERED:none  DRAINS: Bard drain and chest tube placed in the right pleural space   LOCAL MEDICATIONS USED:  MARCAINE     SPECIMEN:  Source of Specimen:  Wedge RLL, RLL superior segmentectomy, multiple lymph nodes  DISPOSITION OF SPECIMEN:  PATHOLOGY. Frozen section of wedge of RLL showed squamous cell carcinoma. RML biopsy was NOT cancer  COUNTS CORRECT:  YES   PLAN OF CARE: Admit to inpatient   PATIENT DISPOSITION:  PACU - hemodynamically stable.   Delay start of Pharmacological VTE agent (>24hrs) due to surgical blood loss or risk of bleeding: yes  FINDINGS: ~ 2 cm mass superior segment of RLL with involvement of visceral pleura- Frozen= squamous cell carcinoma.  Dictation # [id number]

## 2015-05-25 ENCOUNTER — Encounter (HOSPITAL_COMMUNITY): Payer: Self-pay | Admitting: Thoracic Surgery (Cardiothoracic Vascular Surgery)

## 2015-05-25 ENCOUNTER — Inpatient Hospital Stay (HOSPITAL_COMMUNITY): Payer: Medicare Other

## 2015-05-25 LAB — BASIC METABOLIC PANEL
Anion gap: 8 (ref 5–15)
BUN: <5 mg/dL — ABNORMAL LOW (ref 6–20)
CO2: 26 mmol/L (ref 22–32)
Calcium: 8.4 mg/dL — ABNORMAL LOW (ref 8.9–10.3)
Chloride: 107 mmol/L (ref 101–111)
Creatinine, Ser: 0.99 mg/dL (ref 0.61–1.24)
GFR calc Af Amer: >60 mL/min (ref >60)
GFR calc non Af Amer: >60 mL/min (ref >60)
Glucose, Bld: 107 mg/dL — ABNORMAL HIGH (ref 65–99)
Potassium: 4 mmol/L (ref 3.5–5.1)
Sodium: 141 mmol/L (ref 135–145)

## 2015-05-25 LAB — BLOOD GAS, ARTERIAL
Acid-base deficit: 1.3 mmol/L (ref 0.0–2.0)
Bicarbonate: 23 mEq/L (ref 20.0–24.0)
O2 Content: 2.5 L/min
O2 Saturation: 94.7 %
Patient temperature: 98.6
TCO2: 24.1 mmol/L (ref 0–100)
pCO2 arterial: 38.5 mmHg (ref 35.0–45.0)
pH, Arterial: 7.393 (ref 7.350–7.450)
pO2, Arterial: 73.6 mmHg — ABNORMAL LOW (ref 80.0–100.0)

## 2015-05-25 LAB — CBC
HCT: 42.3 % (ref 39.0–52.0)
Hemoglobin: 14 g/dL (ref 13.0–17.0)
MCH: 29.1 pg (ref 26.0–34.0)
MCHC: 33.1 g/dL (ref 30.0–36.0)
MCV: 87.9 fL (ref 78.0–100.0)
Platelets: 164 10*3/uL (ref 150–400)
RBC: 4.81 MIL/uL (ref 4.22–5.81)
RDW: 13.3 % (ref 11.5–15.5)
WBC: 10 10*3/uL (ref 4.0–10.5)

## 2015-05-25 LAB — GLUCOSE, CAPILLARY
Glucose-Capillary: 115 mg/dL — ABNORMAL HIGH (ref 65–99)
Glucose-Capillary: 76 mg/dL (ref 65–99)
Glucose-Capillary: 83 mg/dL (ref 65–99)

## 2015-05-25 LAB — ACID FAST SMEAR (AFB, MYCOBACTERIA): Acid Fast Smear: NEGATIVE

## 2015-05-25 MED ORDER — INSULIN ASPART 100 UNIT/ML ~~LOC~~ SOLN: 0.0000 [IU] | Freq: Three times a day (TID) | SUBCUTANEOUS | Status: DC

## 2015-05-25 MED ORDER — ENOXAPARIN SODIUM 40 MG/0.4ML ~~LOC~~ SOLN
40.0000 mg | SUBCUTANEOUS | Status: DC
  Administered 2015-05-25 – 2015-05-31 (×7): 40 mg via SUBCUTANEOUS
  Filled 2015-05-25 (×7): qty 0.4

## 2015-05-25 MED ORDER — CETYLPYRIDINIUM CHLORIDE 0.05 % MT LIQD
7.0000 mL | Freq: Two times a day (BID) | OROMUCOSAL | Status: DC
  Administered 2015-05-25 – 2015-05-31 (×11): 7 mL via OROMUCOSAL

## 2015-05-25 NOTE — Progress Notes (Signed)
1 Day Post-Op Procedure(s) (LRB): RIGHT VIDEO ASSISTED THORACOSCOPY (VATS)/ RIGHTR LOWER LOBE WEDGE RESECTION (Right) RIGHT LOWER LOBE SUPERIOR SEGMENTECTOMY (Right) NODE DISSECTION (Right) Subjective: Some incisional discomfort Had episode of diarrhea last night  Objective: Vital signs in last 24 hours: Temp:  [97.5 F (36.4 C)-98.6 F (37 C)] 97.9 F (36.6 C) (04/18 0424) Pulse Rate:  [72-100] 85 (04/18 0700) Cardiac Rhythm:  [-] Normal sinus rhythm (04/18 0400) Resp:  [7-27] 17 (04/18 0700) BP: (103-157)/(62-106) 133/85 mmHg (04/18 0700) SpO2:  [89 %-100 %] 95 % (04/18 0749) Arterial Line BP: (124-191)/(64-86) 176/84 mmHg (04/17 1900) Weight:  [179 lb 6.4 oz (81.375 kg)] 179 lb 6.4 oz (81.375 kg) (04/18 0700)  Hemodynamic parameters for last 24 hours:    Intake/Output from previous day: 04/17 0701 - 04/18 0700 In: 3600 [I.V.:3500; IV Piggyback:100] Out: 2280 [Urine:1830; Blood:50; Chest Tube:400] Intake/Output this shift:    General appearance: alert, cooperative and no distress Neurologic: intact Heart: regular rate and rhythm Lungs: diminished breath sounds bibasilar Abdomen: normal findings: soft, non-tender small air leak  Lab Results:  Recent Labs  05/25/15 0414  WBC 10.0  HGB 14.0  HCT 42.3  PLT 164   BMET:  Recent Labs  05/25/15 0414  NA 141  K 4.0  CL 107  CO2 26  GLUCOSE 107*  BUN <5*  CREATININE 0.99  CALCIUM 8.4*    PT/INR: No results for input(s): LABPROT, INR in the last 72 hours. ABG    Component Value Date/Time   PHART 7.393 05/25/2015 0425   HCO3 23.0 05/25/2015 0425   TCO2 24.1 05/25/2015 0425   ACIDBASEDEF 1.3 05/25/2015 0425   O2SAT 94.7 05/25/2015 0425   CBG (last 3)   Recent Labs  05/24/15 1405 05/24/15 1821 05/25/15 0015  GLUCAP 98 96 83    Assessment/Plan: S/P Procedure(s) (LRB): RIGHT VIDEO ASSISTED THORACOSCOPY (VATS)/ RIGHTR LOWER LOBE WEDGE RESECTION (Right) RIGHT LOWER LOBE SUPERIOR SEGMENTECTOMY  (Right) NODE DISSECTION (Right) -  POD # 1 Segmentectomy  CV- stable- dc a line  RESP- small air leak- place CT to water seal  IS, nebs PRN  RENAL- creatinine OK  ENDO- CBG normal  SCD + enoxaparin for DVT prophylaxis  Ambulate  Transfer to [facility]    LOS: 1 day    [name] 05/25/2015

## 2015-05-25 NOTE — Progress Notes (Signed)
Transferred to 3S 05 via WC, O2 and monitor. SCD's with pt. RN to receive in room. Pt placed in bed.

## 2015-05-25 NOTE — Plan of Care (Signed)
Problem: Phase I Progression Outcomes Goal: Initial discharge plan identified Outcome: Completed/Met Date Met:  05/25/15 wife

## 2015-05-25 NOTE — Progress Notes (Signed)
Patient's pain was under good control with Fentanyl PCA and had one large liquid stool. Dangled overnight and sat him up on a chair. Patient appears comfortable.

## 2015-05-25 NOTE — Op Note (Signed)
NAMETRAYTON, SZABO              ACCOUNT NO.:  [id number]  MEDICAL RECORD NO.:  [id number]  LOCATION:  2S04C                        FACILITY:  [facility]  PHYSICIAN:  Revonda Standard. Roxan Hockey, M.D.DATE OF BIRTH:  1950-06-06  DATE OF PROCEDURE:  05/24/2015 DATE OF DISCHARGE:                              OPERATIVE REPORT   PREOPERATIVE DIAGNOSIS:  Right lower lobe lung nodule.  POSTOPERATIVE DIAGNOSIS:  Squamous cell carcinoma, right lower lung, clinical stage IB.  PROCEDURES:   Right video-assisted thoracoscopy Right lower lobe wedge resection Biopsy of right middle lobe Thoracoscopic right lower lobe superior segmentectomy Mediastinal lymph node dissection On-Q local anesthetic catheter placement.  SURGEON:  Revonda Standard. Roxan Hockey, M.D.  ASSISTANT:  Lars Pinks, PA  ANESTHESIA:  General.  FINDINGS:  Approximately 2-cm mass in superior segment of right lower lobe. Frozen section revealed squamous cell carcinoma. Bronchial margin of superior segmentectomy- no tumor seen.  Multiple small white nodular areas on visceral pleura of right middle lobe.  Frozen section revealed fibrosis and chronic inflammation, no tumor was seen.  CLINICAL NOTE:  Mr. Worton is a 65 year old man with a history of tobacco abuse and COPD.  He recently had a chest x-ray that showed a right lung nodule.  A CT of the chest confirmed a 1.3-cm spiculated nodule in the superior segment of the right lower lobe.  PET-CT showed that nodule was hypermetabolic with an SUV of 9.7.  There was no hilar or mediastinal adenopathy.  There also was some activity in the right tonsillar area, but the patient had a sore throat recently associated with his cough.  Due to the highly suspicious nature of the lung nodule, the patient was advised to undergo right video-assisted thoracoscopy for wedge resection and possible superior segmentectomy along with lymph node dissection.  The indications, risks, benefits, and  alternatives were discussed in detail with the patient.  He understood and accepted the risks and agreed to proceed.  OPERATIVE NOTE:  Mr. Bankhead was brought to the preoperative holding area on May 24, 2015.  Anesthesia placed a central line and an arterial blood pressure monitoring line.  He was taken to the operating room, anesthetized, and intubated with a double-lumen endotracheal tube. Intravenous antibiotics were administered.  A Foley catheter was placed. Sequential compressive devices were placed on the calves for DVT prophylaxis.  He was placed in a left lateral decubitus position and the right chest was prepped and draped in usual sterile fashion.  Single lung ventilation of the right lung was initiated and was tolerated well throughout the procedure.  An incision was made in the seventh intercostal space in the anterior axillary line.  A 5-mm port was inserted into the chest.  The thoracoscope was advanced in the chest.  There was good isolation of the right lung.  There was a small puncture in the right lower lobe.  An incision was made in the fourth interspace anterolaterally.  No rib spreading was performed during the procedure.  The puncture in the midportion of the right lower lobe was oversewn with a 3-0 Vicryl figure- of-eight suture.  Inspection of the right lower lobe revealed an obvious spiculated mass with possible involvement of the visceral pleura  in the superior segment of the right lower lobe.  A wedge resection was performed with sequential firings of an endoscopic stapler. An Ethicon powered stapler with a gold cartridge was used.  The specimen was placed into an endoscopic retrieval bag and removed through the incision and was sent for frozen section.  While awaiting the results on that, the middle and upper lobes were inspected and there were multiple small white punctate nodular lesions on the visceral surface mostly of the lower lobe.  One of these  areas was biopsied. A small piece was sent for AFB and fungal cultures and the remainder sent to Pathology.  The frozen section on the lower lobe nodule revealed squamous cell carcinoma.  The middle lobe lesion turned out to be fibrosis and chronic inflammation.  The inferior ligament was divided. A level 9 lymph node was removed.  All lymph nodes that were encountered were benign in appearance.  The pleural reflection was divided at the hilum posteriorly up to the level of the right upper lobe bronchus.  A level 7 node was removed.  The fissure then was inspected. The basilar segmental pulmonary artery was visible and the pleura overlying this was incised.  The pleura then was divided with electrocautery exposing the remainder of the lower lobe artery including the superior segmental branch.  The superior segmental branch was dissected out, encircled and divided with an endoscopic vascular stapler.  Nodes that were encountered during the dissection were removed and sent for permanent pathology.  The superior segmental bronchus then was identified and encircled.  A stapler with a green cartridge was placed across the superior segmental bronchus and closed, but not fired.  A test inflation showed good aeration of the upper, middle and remainder of the basilar segments of the lower lobe.  The stapler then was fired transecting the superior segmental bronchus.  The superior segmentectomy was completed with sequential firings of the endoscopic stapler with gold cartridges.  The specimen was removed and sent for frozen section of the bronchial margin, which returned negative.  The right paratracheal area then was inspected and two lymph nodes were removed.  These were mildly enlarged, but otherwise benign in appearance.  The chest was copiously irrigated with warm saline.  A test inflation to 30 cm of water showed minimal air leakage from around the fissure, but no leakage from the bronchial  stump.  ProGEL was applied to the parenchymal leaks.  An On-Q local anesthetic catheter was placed through a separate stab incision posteriorly and tunneled into a subpleural location, and was primed with 10 mL of 0.5% Marcaine.  It was secured to the skin with a 3-0 silk suture.  A 28-French Blake drain was placed via the original port incision.  A second port incision was used for placement of a 28-French chest tube anteriorly.  These were secured to the skin with #1 silk sutures.  The right lung was reinflated.  The chest tubes were placed to suction.  The incision was closed in three layers in standard fashion.  The patient was placed back in supine position.  He was extubated in the operating room and taken to the postanesthetic care unit in good condition.     Revonda Standard Roxan Hockey, M.D.     SCH/MEDQ  D:  05/24/2015  T:  05/25/2015  Job:  [id number]

## 2015-05-26 ENCOUNTER — Inpatient Hospital Stay (HOSPITAL_COMMUNITY): Payer: Medicare Other

## 2015-05-26 LAB — COMPREHENSIVE METABOLIC PANEL WITH GFR
ALT: 25 U/L (ref 17–63)
AST: 33 U/L (ref 15–41)
Albumin: 3.1 g/dL — ABNORMAL LOW (ref 3.5–5.0)
Alkaline Phosphatase: 65 U/L (ref 38–126)
Anion gap: 7 (ref 5–15)
BUN: <5 mg/dL — ABNORMAL LOW (ref 6–20)
CO2: 27 mmol/L (ref 22–32)
Calcium: 8.7 mg/dL — ABNORMAL LOW (ref 8.9–10.3)
Chloride: 102 mmol/L (ref 101–111)
Creatinine, Ser: 0.93 mg/dL (ref 0.61–1.24)
GFR calc Af Amer: >60 mL/min
GFR calc non Af Amer: >60 mL/min
Glucose, Bld: 101 mg/dL — ABNORMAL HIGH (ref 65–99)
Potassium: 4.1 mmol/L (ref 3.5–5.1)
Sodium: 136 mmol/L (ref 135–145)
Total Bilirubin: 0.9 mg/dL (ref 0.3–1.2)
Total Protein: 5.5 g/dL — ABNORMAL LOW (ref 6.5–8.1)

## 2015-05-26 LAB — CBC
HCT: 42.1 % (ref 39.0–52.0)
Hemoglobin: 13.7 g/dL (ref 13.0–17.0)
MCH: 28.7 pg (ref 26.0–34.0)
MCHC: 32.5 g/dL (ref 30.0–36.0)
MCV: 88.3 fL (ref 78.0–100.0)
Platelets: 168 10*3/uL (ref 150–400)
RBC: 4.77 MIL/uL (ref 4.22–5.81)
RDW: 13.3 % (ref 11.5–15.5)
WBC: 10.5 10*3/uL (ref 4.0–10.5)

## 2015-05-26 NOTE — Progress Notes (Addendum)
      [address]       [place] [address]             [phone]       2 Days Post-Op Procedure(s) (LRB): RIGHT VIDEO ASSISTED THORACOSCOPY (VATS)/ RIGHTR LOWER LOBE WEDGE RESECTION (Right) RIGHT LOWER LOBE SUPERIOR SEGMENTECTOMY (Right) NODE DISSECTION (Right)  Subjective: No further diarrhea. Has incisional pain  Objective: Vital signs in last 24 hours: Temp:  [98.1 F (36.7 C)-98.5 F (36.9 C)] 98.1 F (36.7 C) (04/19 0818) Pulse Rate:  [84-96] 85 (04/19 0350) Cardiac Rhythm:  [-] Normal sinus rhythm (04/19 0350) Resp:  [15-25] 15 (04/19 0400) BP: (121-151)/(74-99) 126/82 mmHg (04/19 0350) SpO2:  [90 %-97 %] 97 % (04/19 0400) Arterial Line BP: (160)/(99) 160/99 mmHg (04/18 1000)     Intake/Output from previous day: 04/18 0701 - 04/19 0700 In: 2171.7 [P.O.:890; I.V.:1281.7] Out: 2245 [Urine:1925; Chest Tube:320]   Physical Exam:  Cardiovascular: RRR Pulmonary: Clear to auscultation on left and slightly diminished on right Abdomen: Soft, non tender, bowel sounds present. Wounds: Dressing is clean and dry.   Chest Tubes: to water seal and air leak with cough  Lab Results: CBC: Recent Labs  05/25/15 0414 05/26/15 0453  WBC 10.0 10.5  HGB 14.0 13.7  HCT 42.3 42.1  PLT 164 168   BMET:  Recent Labs  05/25/15 0414 05/26/15 0453  NA 141 136  K 4.0 4.1  CL 107 102  CO2 26 27  GLUCOSE 107* 101*  BUN <5* <5*  CREATININE 0.99 0.93  CALCIUM 8.4* 8.7*    PT/INR: No results for input(s): LABPROT, INR in the last 72 hours. ABG:  INR: Will add last result for INR, ABG once components are confirmed Will add last 4 CBG results once components are confirmed  Assessment/Plan:  1. CV - SR in the 80's. 2.  Pulmonary - Chest tube to water seal . There is an air leak with cough. Chest tube output 320 cc. CXR this am shows trace right apical pneumothorax, left base atelectasis, and subcutaneous emphysema right lateral chest wall. Encourage  incentive spirometer. Pathology results not available yet. Continue Dulera inhaler. 3. Remove foley 4. Decrease IVF   ZIMMERMAN,DONIELLE MPA-C 05/26/2015,8:19 AM  Patient seen and examined, agree with above Drainage trending down, has a very small air leak- dc posterior CT today  Remo Lipps C. Roxan Hockey, MD [facility] [phone]

## 2015-05-26 NOTE — Progress Notes (Signed)
Posterior chest tube removed per order. No adverse events noted during or after procedure. Patient tolerated procedure well.

## 2015-05-26 NOTE — Care Management Note (Signed)
Case Management Note  Patient Details  Name: Elijah Marshall MRN: [id number] Date of Birth: 09/08/1950  Subjective/Objective:    Patient is from home with spouse, pta indep, has PCP , Dr. [name].  S/p vats, has two chest tubes to water seal, one pulled today.  Patient has insurance for medications, his wife does not work so she will be with him at home 24/hrs.  He has a rolling walker at home but does not use it.  NCM will cont to follow for dc needs.                 Action/Plan:   Expected Discharge Date:                  Expected Discharge Plan:  [place]  In-House Referral:     Discharge planning Services  CM Consult  Post Acute Care Choice:    Choice offered to:     DME Arranged:    DME Agency:     HH Arranged:    [place] Agency:     Status of Service:  In process, will continue to follow  Medicare Important Message Given:    Date Medicare IM Given:    Medicare IM give by:    Date Additional Medicare IM Given:    Additional Medicare Important Message give by:     If discussed at [place] of Stay Meetings, dates discussed:    Additional Comments:  Zenon Mayo, RN 05/26/2015, 1:28 PM

## 2015-05-27 ENCOUNTER — Inpatient Hospital Stay (HOSPITAL_COMMUNITY): Payer: Medicare Other

## 2015-05-27 LAB — TISSUE CULTURE
Culture: NO GROWTH
Gram Stain: NONE SEEN

## 2015-05-27 MED ORDER — POLYETHYLENE GLYCOL 3350 17 G PO PACK
17.0000 g | PACK | Freq: Once | ORAL | Status: AC
  Administered 2015-05-27: 17 g via ORAL
  Filled 2015-05-27: qty 1

## 2015-05-27 MED ORDER — METOPROLOL TARTRATE 12.5 MG HALF TABLET
12.5000 mg | ORAL_TABLET | Freq: Two times a day (BID) | ORAL | Status: DC
  Administered 2015-05-27 (×2): 12.5 mg via ORAL
  Filled 2015-05-27 (×2): qty 1

## 2015-05-27 NOTE — Discharge Summary (Signed)
Physician Discharge Summary       [address]       [place] [address]             [phone]    Patient ID: Elijah Marshall MRN: [id number] DOB/AGE: [birth year] 64 y.o.  Admit date: 05/24/2015 Discharge date: 05/31/2015  Admission Diagnoses: Right lower lobe lung nodule  Discharge Diagnosis: Stage IA Squamous cell carcinoma of right lower lobe  Active Diagnoses:  1. Squamous cell carcinoma right lower lobe  2. COPD (chronic obstructive pulmonary disease) ([place]) 3. GERD (gastroesophageal reflux disease) 4. Tobacco abuse 5. Arthritis  Procedure (s):  Right video-assisted thoracoscopy, right lower lobe wedge resection, biopsy of right middle lobe, thoracoscopic right lower lobe superior segmentectomy, mediastinal lymph node dissection, On-Q local anesthetic catheter placement by Dr. Roxan Hockey on 05/24/2015.  Pathology: Diagnosis 1. Lung, wedge biopsy/resection, Right Lower Lobe - INVASIVE SQUAMOUS CELL CARCINOMA, MODERATELY DIFFERENTIATED, SPANNING 1.3 CM. - TUMOR IS LIMITED TO LUNG PARENCHYMA. - FINAL RESECTION MARGIN IS NEGATIVE. - SEE ONCOLOGY TABLE. 2. Lung, biopsy, Right Middle Lobe - FOCAL FIBROSIS WITH SURROUNDING - NO MALIGNANCY IDENTIFIED. 3. Lung, resection (segmental or lobe), Superior Segment Right Lower Lobe - BENIGN LUNG TISSUE. - BRONCHIAL AND VASCULAR MARGINS ARE NEGATIVE FOR MALIGNANCY. 4. Lymph node, biopsy, 9 node - ONE OF ONE LYMPH NODES NEGATIVE FOR CARCINOMA (0/1). 5. Lymph node, biopsy, 7 node - ONE OF ONE LYMPH NODES NEGATIVE FOR CARCINOMA (0/1). 6. Lymph node, biopsy, 12 node - ONE OF ONE LYMPH NODES NEGATIVE FOR CARCINOMA (0/1). 7. Lymph node, biopsy, 12 node #2 - ONE OF ONE LYMPH NODES NEGATIVE FOR CARCINOMA (0/1). 8. Lymph node, biopsy, 4R node - ONE OF ONE LYMPH NODES NEGATIVE FOR CARCINOMA (0/1). 9. Lymph node, biopsy, 4R #2 - ONE OF ONE LYMPH NODES NEGATIVE FOR CARCINOMA (0/1). TNM code: pT1a, pN0  History of  Presenting Illness: Elijah Marshall is a 65 year old man with a history of tobacco abuse, COPD, hyperlipidemia, and gastroesophageal reflux. He recently developed a dry cough and some wheezing. He was afraid that he was coming down with bronchitis and went to see Dr. Melvyn Novas. A chest x-ray showed a right lung nodule. A CT of the chest confirmed a 1.3 cm spiculated nodule in the superior segment of the right lower lobe. There was no suspicious hilar or mediastinal adenopathy. A PET CT showed the right upper lobe nodule was hypermetabolic with an SUV of 9.7. There was no hilar or mediastinal adenopathy. There was some activity in the right tonsillar area.  He had before we discussed the results of the CT and PET/CT that he had had a sore throat recently. That has resolved. He says he gets short of breath with heavy exertion such as walking up an incline or when he carries heavy objects. He walks about a mile and a half daily. 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 He has had a productive cough but denies hemoptysis. He does have occasional wheezing. He complains of frequent heartburn. He has not had any anginal-like chest pain pressure or tightness. His weight is stable and his appetite is good. He's not had any unusual headaches or visual changes. He is disabled due to multiple previous back surgeries and knee replacement  Dr. Roxan Hockey recommended  a right VATS, wedge resection, followed by a superior segmentectomy of the nodule is positive for cancer on frozen  section. That would incorporate the lymph node dissection as well. Dr. Roxan Hockey discussed the proposed operation in detail with Mr. Milner and his family. They understand the general nature of the procedure, the incisions to be used, the need for general anesthesia, drainage tubes postoperatively, the expected hospital stay, and the overall recovery. They understand that  this appears to be early stage disease, but staging can change with surgical findings. They also understand that no guarantee of cure can be given. I informed him of the indications, risks, benefits, and alternatives. They understand the risk include, but are not limited to death, MI, DVT, PE, bleeding, possible need for transfusion, infection, prolonged air leak, cardiac arrhythmias, as well as the possibility of other unforeseeable complications.  Dr. Roxan Hockey thought a segmentectomy would be sufficient in his case due to the small size and peripheral nature of the lesion. His FEV1 was 1.7 in 2015. His exercise tolerance is good. He has adequate pulmonary reserve to tolerate a segmentectomy and does not need repeat pulmonary function testing. He was admitted to [facility] on 05/24/2015 in order to undergo a right VATS, RLL wedge, thoracoscopic assisted RLL superior segmentectomy, LN dissection, and on Q.  Brief Hospital Course:  He remained afebrile and hemodynamically stable. His a line and foley were removed early in his post operative course. Daily chest x rays remained stable. Chest tube output gradually decreased. He did have a small air leak post op. Posterior chest tube was removed on 04/19 and remaining chest tube was removed on 04/20 (no air leak). On Q was removed on 04/20 as well. He required several liters of oxygen initially. He will need home oxygen at discharge. He is tolerating a diet but has not had a bowel movement yet. He will be given a laxative to help him move his bowels. His wound is clean and dry and continuing to heal.  Despite several attempts the patient was unable to wean down from 3L of oxygen.  His saturations were borderline running 87-93%.  His CXR look good and the patient was in no obvious distress.  It was felt CTA of the chest should be completed to assess for possible pulmonary embolism.  This was completed and was negative for PE.  He was given a dose of IV lasix without much  improvement in level of saturations.Per Dr. Roxan Hockey, patient is surgically stable for discharge today.   Latest Vital Signs: Blood pressure 164/97, pulse 108, temperature 97.7 F (36.5 C), temperature source Oral, resp. rate 19, height '5\' 9"'$  (1.753 m), weight 173 lb 11.6 oz (78.8 kg), SpO2 97 %.  Physical Exam: Cardiovascular: Tachycardic Pulmonary: Clear to auscultation on left and slightly diminished on right Abdomen: Soft, non tender, bowel sounds present. Wounds: Dressing is clean and dry.  Chest Tubes: to water seal and no obvious air leak  Discharge Condition:Stable and discharged to home  Recent laboratory studies:  Lab Results  Component Value Date   WBC 10.5 05/26/2015   HGB 13.7 05/26/2015   HCT 42.1 05/26/2015   MCV 88.3 05/26/2015   PLT 168 05/26/2015   Lab Results  Component Value Date   NA 136 05/26/2015   K 4.1 05/26/2015   CL 102 05/26/2015   CO2 27 05/26/2015   CREATININE 0.93 05/26/2015   GLUCOSE 101* 05/26/2015    Diagnostic Studies:  EXAM: CHEST 2 VIEW  COMPARISON: 05/28/2015  FINDINGS: Tiny right apical pneumothorax may be slightly smaller than it was on the prior day's study, although the change  may be due to changes in patient positioning only. It has not increased in size.  Persistent irregular interstitial and hazy airspace opacity noted in the right mid to lower lung. Mild linear atelectasis at the left lung base. Chronic interstitial thickening. These findings are stable. No new lung abnormalities. Small right pleural effusion.  IMPRESSION: 1. Tiny right apical pneumothorax is either stable or slightly decreased in size the previous day's study. 2. No other change. Persistent patchy interstitial airspace opacity in the right mid to lower lung. Small right effusion.   Electronically Signed  By: Lajean Manes M.D.  On: 05/29/2015 07:38  EXAM: CT ANGIOGRAPHY CHEST WITH CONTRAST  TECHNIQUE: Multidetector CT imaging of  the chest was performed using the standard protocol during bolus administration of intravenous contrast. Multiplanar CT image reconstructions and MIPs were obtained to evaluate the vascular anatomy.  CONTRAST: 100 cc Isovue  COMPARISON: PET-CT 05/03/2015  FINDINGS: Mediastinum/Nodes: No axillary or supraclavicular adenopathy. No mediastinal hilar adenopathy. No pericardial fluid.  No filling defects within pulmonary suggest acute pulmonary embolism. No acute findings aorta great vessels.  Lungs/Pleura:  There is new soft tissue in the azygos esophageal recess with scattered small pockets of gas (image 77, series 4). This soft tissue thickening is along the surgical suture line (image 80, series 8). There several gas locules in the pleural fluid in the RIGHT lower lobe. There is rounded atelectasis in the RIGHT lower lobe additionally (image 98, series 5).  No pneumothorax present. Centrilobular emphysema the upper lobes.  There is some gas along the subcutaneous and muscle layers of the RIGHT lateral chest wall consistent recent prior fluoroscopy.  Upper abdomen: Unremarkable  Musculoskeletal: Again gas along the tissue planes of the RIGHT chest wall consists with recent surgery. Sclerotic lesion in the lower thoracic spine was not hypermetabolic comparison PET-CT scan (image 81, series 9  Review of the MIP images confirms the above findings.  IMPRESSION: 1. Postsurgical change in the RIGHT lower lobe with some solid tissue and gas in the azygos esophageal recess medial to the surgical margin. No pneumothorax. 2. Gas along the RIGHT lateral chest wall consistent recent surgery. 3. No pulmonary embolism.   Electronically Signed  By: Suzy Bouchard M.D.  On: 05/30/2015 13:24   Nm Pet Image Initial (pi) Skull Base To Thigh  05/03/2015  CLINICAL DATA:  Initial treatment strategy for pulmonary nodule. EXAM: NUCLEAR MEDICINE PET SKULL BASE TO THIGH TECHNIQUE:  10.1 mCi F-18 FDG was injected intravenously. Full-ring PET imaging was performed from the skull base to thigh after the radiotracer. CT data was obtained and used for attenuation correction and anatomic localization. FASTING BLOOD GLUCOSE:  Value: 105 Mg/dl COMPARISON:  None. FINDINGS: NECK No hypermetabolic lymph nodes in the neck. Asymmetric increased uptake within the right base of tongue/tonsillar region is identified. The SUV max is equal to 6.99. CHEST Advanced smoking related changes are identified within both lungs. Within the posterior aspect of the superior segment of right lower lobe there is a spiculated subpleural nodule measuring 11 mm, image 73 of series 4. The SUV max associated with this nodule is equal to 9.76. No hypermetabolic hilar or mediastinal lymph nodes identified. ABDOMEN/PELVIS No abnormal hypermetabolic activity within the liver, pancreas, adrenal glands, or spleen. No hypermetabolic lymph nodes in the abdomen or pelvis. SKELETON No focal hypermetabolic activity to suggest skeletal metastasis. IMPRESSION: 1. There is intense radiotracer uptake associated with the subpleural nodule in the superior segment of the right lower lobe. Findings are worrisome for primary  bronchogenic carcinoma. No evidence for hypermetabolic hilar or mediastinal lymph node metastasis or distant metastatic disease. 2. Asymmetric increased uptake within the right tonsillar region is identified. Nonspecific in may be physiologic. In a patient that may have risk factors for head neck carcinoma correlation with direct visualization is advised to exclude the possibility of a base of tongue or tonsillar lesion. 3. Emphysema. Electronically Signed   By: Kerby Moors M.D.   On: 05/03/2015 17:37     Discharge Medications:   Medication List    STOP taking these medications        HYDROcodone-acetaminophen 5-325 MG tablet  Commonly known as:  NORCO/VICODIN      TAKE these medications        Albuterol  Sulfate 108 (90 Base) MCG/ACT Aepb  Commonly known as:  PROAIR RESPICLICK  Inhale 2 puffs into the lungs every 4 (four) hours as needed.     budesonide-formoterol 80-4.5 MCG/ACT inhaler  Commonly known as:  SYMBICORT  Inhale 2 puffs into the lungs 2 (two) times daily.     clonazePAM 1 MG tablet  Commonly known as:  KLONOPIN  Take 1/2 tablet as needed twice a day for anxiety and 1-2 tablets at night as needed for sleep     dexlansoprazole 60 MG capsule  Commonly known as:  DEXILANT  Take 1 capsule (60 mg total) by mouth daily.     levocetirizine 5 MG tablet  Commonly known as:  XYZAL  Take 1 tablet (5 mg total) by mouth every evening.     metoprolol tartrate 25 MG tablet  Commonly known as:  LOPRESSOR  Take 0.5 tablets (12.5 mg total) by mouth 2 (two) times daily.     montelukast 10 MG tablet  Commonly known as:  SINGULAIR  Take 1 tablet (10 mg total) by mouth at bedtime.     pravastatin 40 MG tablet  Commonly known as:  PRAVACHOL  Take 1 tablet (40 mg total) by mouth at bedtime.     traMADol 50 MG tablet  Commonly known as:  ULTRAM  Take 1-2 tablets (50-100 mg total) by mouth every 6 (six) hours as needed (mild pain).     triamcinolone 55 MCG/ACT Aero nasal inhaler  Commonly known as:  NASACORT ALLERGY 24HR  Place 2 sprays into the nose at bedtime.        Follow Up Appointments: Follow-up Information    Follow up with [name], MD On 06/15/2015.   Specialty:  Cardiothoracic Surgery   Why:  PA/LAT CXR to be taken (at [facility] which is in the same building as Dr. Leonarda Salon office) on 06/15/2015 at 9:15 am;Appointment time is at 10:00 am   Contact information:   [address] [place] [address] [phone]       Follow up with [facility].   Why:  home oxygen   Contact information:   [address] [place] [address] [phone]       Follow up with [name], MD. Go on 06/24/2015.    Specialty:  Family Medicine   Why:  A follow up appointment regarding hypertension (high blood pressure)/post op is scheduled for May 18th, 2017 at 11:30am.   Contact information:   [name] [address] [phone]       Signed: Lars Pinks MPA-C 05/31/2015, 12:19 PM

## 2015-05-27 NOTE — Discharge Instructions (Signed)
Thoracoscopy, Care After Refer to this sheet in the next few weeks. These instructions provide you with information about caring for yourself after your procedure. Your health care provider may also give you more specific instructions. Your treatment has been planned according to current medical practices, but problems sometimes occur. Call your health care provider if you have any problems or questions after your procedure. WHAT TO EXPECT AFTER THE PROCEDURE: After your procedure, it is common to feel sore for up to two weeks. HOME CARE INSTRUCTIONS  There are many different ways to close and cover an incision, including stitches (sutures), skin glue, and adhesive strips. Follow your health care provider's instructions about:  Incision care.  Bandage (dressing) changes and removal.  Incision closure removal.  Check your incision area every day for signs of infection. Watch for:  Redness, swelling, or pain.  Fluid, blood, or pus.  Take medicines only as directed by your health care provider.  Try to cough often. Coughing helps to protect against lung infection (pneumonia). It may hurt to cough. If this happens, hold a pillow against your chest when you cough.  Take deep breaths. This also helps to protect against pneumonia.  If you were given an incentive spirometer, use it as directed by your health care provider.  Do not take baths, swim, or use a hot tub until your health care provider approves. You may take showers.  Avoid lifting until your health care provider approves.  Avoid driving until your health care provider approves.  Do not travel by airplane after the chest tube is removed until your health care provider approves. SEEK MEDICAL CARE IF:  You have a fever.  Pain medicines do not ease your pain.  You have redness, swelling, or increasing pain in your incision area.  You develop a cough that does not go away, or you are coughing up mucus that is yellow or  green. SEEK IMMEDIATE MEDICAL CARE IF:  You have fluid, blood, or pus coming from your incision.  There is a bad smell coming from your incision or dressing.  You develop a rash.  You have difficulty breathing.  You cough up blood.  You develop light-headedness or you feel faint.  You develop chest pain.  Your heartbeat feels irregular or very fast.   This information is not intended to replace advice given to you by your health care provider. Make sure you discuss any questions you have with your health care provider.   Document Released: 08/12/2004 Document Revised: 02/13/2014 Document Reviewed: 10/08/2013 Elsevier Interactive Patient Education [employer].

## 2015-05-27 NOTE — Clinical Documentation Improvement (Addendum)
Cardiothoracic  The medical record includes the following  unclear abbreviation: LOC .......Marland Kitchen prog nt 05/27/15 Cardiothoracic  Please provide a greater specificity of the diagnosis.  Thank you   Please exercise your independent, professional judgment when responding. A specific answer is not anticipated or expected.   Thank You, [place] [phone]

## 2015-05-27 NOTE — Progress Notes (Addendum)
      [address]       ,[place] [address]             [phone]       3 Days Post-Op Procedure(s) (LRB): RIGHT VIDEO ASSISTED THORACOSCOPY (VATS)/ RIGHTR LOWER LOBE WEDGE RESECTION (Right) RIGHT LOWER LOBE SUPERIOR SEGMENTECTOMY (Right) NODE DISSECTION (Right)  Subjective: Patient with constipation  Objective: Vital signs in last 24 hours: Temp:  [98.1 F (36.7 C)-99 F (37.2 C)] 98.1 F (36.7 C) (04/20 0741) Pulse Rate:  [86-108] 99 (04/20 0335) Cardiac Rhythm:  [-] Normal sinus rhythm (04/20 0335) Resp:  [16-27] 21 (04/20 0749) BP: (122-143)/(79-95) 142/88 mmHg (04/20 0335) SpO2:  [90 %-97 %] 90 % (04/20 0749)     Intake/Output from previous day: 04/19 0701 - 04/20 0700 In: 1620 [P.O.:1320; I.V.:300] Out: 1990 [TGGYI:9485; Chest Tube:60]   Physical Exam:  Cardiovascular: Tachycardic Pulmonary: Clear to auscultation on left and slightly diminished on right Abdomen: Soft, non tender, bowel sounds present. Wounds: Dressing is clean and dry.   Chest Tubes: to water seal and no obvious air leak  Lab Results: CBC:  Recent Labs  05/25/15 0414 05/26/15 0453  WBC 10.0 10.5  HGB 14.0 13.7  HCT 42.3 42.1  PLT 164 168   BMET:   Recent Labs  05/25/15 0414 05/26/15 0453  NA 141 136  K 4.0 4.1  CL 107 102  CO2 26 27  GLUCOSE 107* 101*  BUN <5* <5*  CREATININE 0.99 0.93  CALCIUM 8.4* 8.7*    PT/INR: No results for input(s): LABPROT, INR in the last 72 hours. ABG:  INR: Will add last result for INR, ABG once components are confirmed Will add last 4 CBG results once components are confirmed  Assessment/Plan:  1. CV - ST in the 110's and SBP in the 150's. Will start Lopressor 12.5 mg bid. 2.  Pulmonary - Posterior chest tube removed yesterday. Chest tube to water seal and there is no air leak. Chest tube output 130 cc last 24 hours. CXR this am appears stable. Encourage incentive spirometer.Continue Dulera inhaler. Pathology (Dr.  Roxan Hockey will discuss with patient) 1. Lung, wedge biopsy/resection, Right Lower Lobe - INVASIVE SQUAMOUS CELL CARCINOMA, MODERATELY DIFFERENTIATED, SPANNING 1.3 CM. - TUMOR IS LIMITED TO LUNG PARENCHYMA. - FINAL RESECTION MARGIN IS NEGATIVE. - SEE ONCOLOGY TABLE. 2. Lung, biopsy, Right Middle Lobe - FOCAL FIBROSIS WITH SURROUNDING CHRONIC INFLAMMATION. - NO MALIGNANCY IDENTIFIED. 3. Lung, resection (segmental or lobe), Superior Segment Right Lower Lobe - BENIGN LUNG TISSUE. - BRONCHIAL AND VASCULAR MARGINS ARE NEGATIVE FOR MALIGNANCY. TNM code: pT1a, pN0 Once remaining chest tube removed, likely home following day 4.LOC  ZIMMERMAN,DONIELLE MPA-C 05/27/2015,7:50 AM  Patient seen and examined, agree with above PATH- T1a,N0- informed patient No air leak- dc CT, dc PCA Probably home tomorrow if he continues to progress  Remo Lipps C. Roxan Hockey, MD [facility] [phone]

## 2015-05-27 NOTE — Progress Notes (Signed)
Chest tube removed per order. PCA discontinued as per order, fentanyl wasted in pyxis, witnessed by Vertis Kelch, RN. Central line removed. Patient tolerated procedures well. No adverse events noted. Radiology called to complete follow up CXR post tube removal.

## 2015-05-28 ENCOUNTER — Inpatient Hospital Stay (HOSPITAL_COMMUNITY): Payer: Medicare Other

## 2015-05-28 MED ORDER — ALUM & MAG HYDROXIDE-SIMETH 200-200-20 MG/5ML PO SUSP
30.0000 mL | ORAL | Status: DC | PRN
  Administered 2015-05-28 – 2015-05-29 (×2): 30 mL via ORAL
  Filled 2015-05-28 (×2): qty 30

## 2015-05-28 MED ORDER — LACTULOSE 10 GM/15ML PO SOLN
20.0000 g | Freq: Once | ORAL | Status: AC
  Administered 2015-05-28: 20 g via ORAL
  Filled 2015-05-28: qty 30

## 2015-05-28 NOTE — Progress Notes (Addendum)
      [address]       [place] [address]             [phone]       4 Days Post-Op Procedure(s) (LRB): RIGHT VIDEO ASSISTED THORACOSCOPY (VATS)/ RIGHTR LOWER LOBE WEDGE RESECTION (Right) RIGHT LOWER LOBE SUPERIOR SEGMENTECTOMY (Right) NODE DISSECTION (Right)  Subjective: Patient without complaints.  Objective: Vital signs in last 24 hours: Temp:  [98.1 F (36.7 C)-99.8 F (37.7 C)] 98.4 F (36.9 C) (04/21 0440) Pulse Rate:  [77-119] 77 (04/21 0400) Cardiac Rhythm:  [-] Normal sinus rhythm (04/20 2041) Resp:  [10-25] 10 (04/21 0400) BP: (106-158)/(70-94) 106/70 mmHg (04/21 0400) SpO2:  [90 %-100 %] 100 % (04/21 0400)     Intake/Output from previous day: 04/20 0701 - 04/21 0700 In: 1260 [P.O.:1200; I.V.:60] Out: 3716 [Urine:1375; Chest Tube:80]   Physical Exam:  Cardiovascular: RRR Pulmonary: Clear to auscultation on left and slightly diminished on right Abdomen: Soft, non tender, bowel sounds present. Wounds: Dressing is clean and dry.     Lab Results: CBC:  Recent Labs  05/26/15 0453  WBC 10.5  HGB 13.7  HCT 42.1  PLT 168   BMET:   Recent Labs  05/26/15 0453  NA 136  K 4.1  CL 102  CO2 27  GLUCOSE 101*  BUN <5*  CREATININE 0.93  CALCIUM 8.7*    PT/INR: No results for input(s): LABPROT, INR in the last 72 hours. ABG:  INR: Will add last result for INR, ABG once components are confirmed Will add last 4 CBG results once components are confirmed  Assessment/Plan:  1. CV - SR in the 70's this am. Will stop Lopressor 2.  Pulmonary - On 3 liters of oxygen via [place]. Wean to room air as tolerates. CXR this am appears to show small right apical pneumothorax. Encourage incentive spirometer.Continue Dulera inhaler. 3. LOC constipation 4. Possible discharge later today vs am  ZIMMERMAN,DONIELLE MPA-C 05/28/2015,7:25 AM   Patient seen and examined, agree with above Still on some O2 and hasn't had a BM Home when those  issues resolved  Remo Lipps C. Roxan Hockey, MD [facility] [phone]

## 2015-05-28 NOTE — Care Management Note (Addendum)
Case Management Note  Patient Details  Name: ZACARI STIFF MRN: [id number] Date of Birth: 01/03/1951  Subjective/Objective:     Patient is for dc tomorrow, will need home oxygen, wife in room with patient, they state [facility] for oxygen, [facility] to [facility] , he will bring up to patient's room.  Patient has a walker at home and wife will be with him 24/7 .  No other needs.                Action/Plan:   Expected Discharge Date:                  Expected Discharge Plan:  [place]  In-House Referral:     Discharge planning Services  CM Consult  Post Acute Care Choice:  Durable Medical Equipment Choice offered to:  Patient, Spouse  DME Arranged:  Oxygen DME Agency:  [place]:    [place] Agency:     Status of Service:  Completed, signed off  Medicare Important Message Given:    Date Medicare IM Given:    Medicare IM give by:    Date Additional Medicare IM Given:    Additional Medicare Important Message give by:     If discussed at [place] of Stay Meetings, dates discussed:    Additional Comments:  Zenon Mayo, RN 05/28/2015, 11:05 AM

## 2015-05-28 NOTE — Progress Notes (Signed)
SATURATION QUALIFICATIONS: (This note is used to comply with regulatory documentation for home oxygen)  Patient Saturations on Room Air at Rest = 88%  Patient Saturations on Room Air while Ambulating = 83%  Patient Saturations on 4 Liters of oxygen while Ambulating = 91%  Please briefly explain why patient needs home oxygen: Patient required 4L oxygen via nasal cannula while ambulating.

## 2015-05-29 ENCOUNTER — Inpatient Hospital Stay (HOSPITAL_COMMUNITY): Payer: Medicare Other

## 2015-05-29 LAB — ANAEROBIC CULTURE: Gram Stain: NONE SEEN

## 2015-05-29 MED ORDER — FUROSEMIDE 10 MG/ML IJ SOLN
40.0000 mg | Freq: Once | INTRAMUSCULAR | Status: AC
  Administered 2015-05-29: 40 mg via INTRAVENOUS
  Filled 2015-05-29: qty 4

## 2015-05-29 MED ORDER — LEVALBUTEROL HCL 0.63 MG/3ML IN NEBU
0.6300 mg | INHALATION_SOLUTION | Freq: Three times a day (TID) | RESPIRATORY_TRACT | Status: DC
  Administered 2015-05-29 – 2015-05-31 (×5): 0.63 mg via RESPIRATORY_TRACT
  Filled 2015-05-29 (×5): qty 3

## 2015-05-29 NOTE — Progress Notes (Addendum)
      [address]       [place] [address]             [phone]      5 Days Post-Op Procedure(s) (LRB): RIGHT VIDEO ASSISTED THORACOSCOPY (VATS)/ RIGHTR LOWER LOBE WEDGE RESECTION (Right) RIGHT LOWER LOBE SUPERIOR SEGMENTECTOMY (Right) NODE DISSECTION (Right)   Subjective:  No new complaints.  Denies shortness of breath.  Currently on 3L of oxygen to maintain sats above 90.  Objective: Vital signs in last 24 hours: Temp:  [97.9 F (36.6 C)-99 F (37.2 C)] 98.2 F (36.8 C) (04/22 0800) Pulse Rate:  [89-100] 100 (04/22 0444) Cardiac Rhythm:  [-] Heart block (04/22 0700) Resp:  [17-26] 21 (04/22 0444) BP: (122-135)/(76-87) 130/87 mmHg (04/22 0444) SpO2:  [91 %-96 %] 96 % (04/22 1006)  Intake/Output from previous day: 04/21 0701 - 04/22 0700 In: 480 [P.O.:480] Out: 625 [Urine:625]  General appearance: alert, cooperative and no distress Heart: regular rate and rhythm Lungs: diminished breath sounds right base Abdomen: soft, non-tender; bowel sounds normal; no masses,  no organomegaly Extremities: none appreciated, no LE tenderness or calf pain Wound: clean and dry  Lab Results: No results for input(s): WBC, HGB, HCT, PLT in the last 72 hours. BMET: No results for input(s): NA, K, CL, CO2, GLUCOSE, BUN, CREATININE, CALCIUM in the last 72 hours.  PT/INR: No results for input(s): LABPROT, INR in the last 72 hours. ABG    Component Value Date/Time   PHART 7.393 05/25/2015 0425   HCO3 23.0 05/25/2015 0425   TCO2 24.1 05/25/2015 0425   ACIDBASEDEF 1.3 05/25/2015 0425   O2SAT 94.7 05/25/2015 0425   CBG (last 3)  No results for input(s): GLUCAP in the last 72 hours.  Assessment/Plan: S/P Procedure(s) (LRB): RIGHT VIDEO ASSISTED THORACOSCOPY (VATS)/ RIGHTR LOWER LOBE WEDGE RESECTION (Right) RIGHT LOWER LOBE SUPERIOR SEGMENTECTOMY (Right) NODE DISSECTION (Right)  1. Pulm- unable to wean oxygen, will need at discharge, but ideally would like patient  on 2L prior to leaving... Will add xopnex nebs, CXR with improvement in apical pneumothorax, small right sided pleural effusion 2. GI- constipation resolved 3. CV- NSR, remains hemodynamically stable off lopressor 4. Dispo- patient stable, work on pulm toilet, home oxygen has been arranged, will d/c home once able to maintain sats above 90% on 2L   LOS: 5 days    BARRETT, [place] 05/29/2015  I have seen and examined the patient and agree with the assessment and plan as outlined.  Rexene Alberts, MD 05/29/2015 11:14 AM

## 2015-05-30 ENCOUNTER — Encounter (HOSPITAL_COMMUNITY): Payer: Self-pay | Admitting: Radiology

## 2015-05-30 ENCOUNTER — Inpatient Hospital Stay (HOSPITAL_COMMUNITY): Payer: Medicare Other

## 2015-05-30 MED ORDER — METOPROLOL TARTRATE 12.5 MG HALF TABLET
12.5000 mg | ORAL_TABLET | Freq: Two times a day (BID) | ORAL | Status: DC
  Administered 2015-05-30 – 2015-05-31 (×3): 12.5 mg via ORAL
  Filled 2015-05-30 (×3): qty 1

## 2015-05-30 MED ORDER — IOPAMIDOL (ISOVUE-370) INJECTION 76%
INTRAVENOUS | Status: AC
  Administered 2015-05-30: 13:00:00
  Filled 2015-05-30: qty 100

## 2015-05-30 NOTE — Progress Notes (Addendum)
      [address]       [place] [address]             [phone]      6 Days Post-Op Procedure(s) (LRB): RIGHT VIDEO ASSISTED THORACOSCOPY (VATS)/ RIGHTR LOWER LOBE WEDGE RESECTION (Right) RIGHT LOWER LOBE SUPERIOR SEGMENTECTOMY (Right) NODE DISSECTION (Right)   Subjective:  [name] complains of chest discomfort and sweating this morning.  Denies dyspnea and nausea.  He states it starts at his incision and radiates across his chest.  He continues to have issues with oxygenation.  His sats are 87-92 on 3L at rest.  Objective: Vital signs in last 24 hours: Temp:  [98.1 F (36.7 C)-98.9 F (37.2 C)] 98.1 F (36.7 C) (04/23 0751) Pulse Rate:  [92-109] 92 (04/23 0751) Cardiac Rhythm:  [-] Sinus tachycardia (04/23 0700) Resp:  [17-25] 17 (04/23 0751) BP: (117-146)/(78-95) 146/81 mmHg (04/23 0751) SpO2:  [90 %-97 %] 92 % (04/23 1044)  Intake/Output from previous day: 04/22 0701 - 04/23 0700 In: -  Out: 300 [Urine:300] Intake/Output this shift: Total I/O In: -  Out: 150 [Urine:150]  General appearance: alert, cooperative and no distress Heart: regular rate and rhythm and tachy Lungs: clear to auscultation bilaterally Abdomen: soft, non-tender; bowel sounds normal; no masses,  no organomegaly Extremities: extremities normal, atraumatic, no cyanosis or edema Wound: clean and dry  Lab Results: No results for input(s): WBC, HGB, HCT, PLT in the last 72 hours. BMET: No results for input(s): NA, K, CL, CO2, GLUCOSE, BUN, CREATININE, CALCIUM in the last 72 hours.  PT/INR: No results for input(s): LABPROT, INR in the last 72 hours. ABG    Component Value Date/Time   PHART 7.393 05/25/2015 0425   HCO3 23.0 05/25/2015 0425   TCO2 24.1 05/25/2015 0425   ACIDBASEDEF 1.3 05/25/2015 0425   O2SAT 94.7 05/25/2015 0425   CBG (last 3)  No results for input(s): GLUCAP in the last 72 hours.  Assessment/Plan: S/P Procedure(s) (LRB): RIGHT VIDEO ASSISTED  THORACOSCOPY (VATS)/ RIGHTR LOWER LOBE WEDGE RESECTION (Right) RIGHT LOWER LOBE SUPERIOR SEGMENTECTOMY (Right) NODE DISSECTION (Right)  1. CV- Sinus Tach, HTN- will start low dose Beta Blocker....chest pain, got EKG which is stable in appearance from 05/20/2015... ? Enzymes will discuss with Dr. Roxy Manns 2. Pulm- hyoxia- continues to struggle to wean from 3L of oxygen recent CXR is free from significant effusion, pneumothorax.... Continue nebulizers, repeat CXR in AM 3. Dispo- patient stable, will discuss need for cardiac enzymes, patient doesn't fit his clinical picture, with tachycardia and decreased oxygen sats Pulmonary embolism may be the source however he is on full dose Lovenox, will discuss further management with Dr. Roxy Manns   LOS: 6 days    [place] Handler 05/30/2015  I have seen and examined the patient and agree with the assessment and plan as outlined.  Still w/ unusually high O2 requirement despite good looking CXR.  Will get CTA chest to r/o PE  Rexene Alberts, MD 05/30/2015 11:46 AM

## 2015-05-31 MED ORDER — METOPROLOL TARTRATE 25 MG PO TABS: 12.5000 mg | ORAL_TABLET | Freq: Two times a day (BID) | ORAL | Status: DC

## 2015-05-31 MED ORDER — TRAMADOL HCL 50 MG PO TABS: 50.0000 mg | ORAL_TABLET | Freq: Four times a day (QID) | ORAL | Status: DC | PRN

## 2015-05-31 NOTE — Progress Notes (Signed)
The 81% saturation in the 0959 note, was on ROOM AIR at rest.

## 2015-05-31 NOTE — Progress Notes (Signed)
Use of home oxygen cannister explained to patient and spouse.

## 2015-05-31 NOTE — Progress Notes (Signed)
Oxygen sats dropped to 81% at rest.  Oxygen replaced at 2 l/min nasal cannula and sats returned to 92%.

## 2015-05-31 NOTE — Progress Notes (Signed)
7 Days Post-Op Procedure(s) (LRB): RIGHT VIDEO ASSISTED THORACOSCOPY (VATS)/ RIGHTR LOWER LOBE WEDGE RESECTION (Right) RIGHT LOWER LOBE SUPERIOR SEGMENTECTOMY (Right) NODE DISSECTION (Right) Subjective: Feels better today   Objective: Vital signs in last 24 hours: Temp:  [97.7 F (36.5 C)-98.6 F (37 C)] 97.7 F (36.5 C) (04/24 0744) Pulse Rate:  [82-112] 82 (04/24 0356) Cardiac Rhythm:  [-] Normal sinus rhythm (04/24 0744) Resp:  [16-22] 19 (04/24 0356) BP: (131-152)/(87-96) 131/91 mmHg (04/24 0356) SpO2:  [90 %-98 %] 92 % (04/24 0730)  Hemodynamic parameters for last 24 hours:    Intake/Output from previous day: 04/23 0701 - 04/24 0700 In: 480 [P.O.:480] Out: 701 [Urine:700; Stool:1] Intake/Output this shift: Total I/O In: -  Out: 300 [Urine:300]  General appearance: alert, cooperative and no distress Neurologic: intact Heart: mildly tachy Lungs: diminished breath sounds bilaterally  Lab Results: No results for input(s): WBC, HGB, HCT, PLT in the last 72 hours. BMET: No results for input(s): NA, K, CL, CO2, GLUCOSE, BUN, CREATININE, CALCIUM in the last 72 hours.  PT/INR: No results for input(s): LABPROT, INR in the last 72 hours. ABG    Component Value Date/Time   PHART 7.393 05/25/2015 0425   HCO3 23.0 05/25/2015 0425   TCO2 24.1 05/25/2015 0425   ACIDBASEDEF 1.3 05/25/2015 0425   O2SAT 94.7 05/25/2015 0425   CBG (last 3)  No results for input(s): GLUCAP in the last 72 hours.  Assessment/Plan: S/P Procedure(s) (LRB): RIGHT VIDEO ASSISTED THORACOSCOPY (VATS)/ RIGHTR LOWER LOBE WEDGE RESECTION (Right) RIGHT LOWER LOBE SUPERIOR SEGMENTECTOMY (Right) NODE DISSECTION (Right) -  He is ready for DC but will need to go home on O2 short term CT chest showed no evidence of PE. There was some atelectasis in the RLL that will improve with time.  Revonda Standard Roxan Hockey, MD [facility] [phone]    LOS: 7 days    [name] 05/31/2015

## 2015-05-31 NOTE — Progress Notes (Signed)
Discharge instructions reviewed with patient and spouse.  All questions answered to their expressed satisfaction.

## 2015-05-31 NOTE — Care Management Note (Signed)
Case Management Note  Patient Details  Name: Elijah Marshall MRN: [id number] Date of Birth: [birth year]  Subjective/Objective:  Patient is for discharge today, home with  Oxygen with [facility]. Spouse will be with patient 24 hours per day, RN educating patient and spouse on dressing changes.                    Action/Plan:   Expected Discharge Date:                  Expected Discharge Plan:  Home/Self Care  In-House Referral:     Discharge planning Services  CM Consult  Post Acute Care Choice:  Durable Medical Equipment Choice offered to:  Patient, Spouse  DME Arranged:  Oxygen DME Agency:  [place]:    [place] Agency:     Status of Service:  Completed, signed off  Medicare Important Message Given:    Date Medicare IM Given:    Medicare IM give by:    Date Additional Medicare IM Given:    Additional Medicare Important Message give by:     If discussed at [place] of Stay Meetings, dates discussed:    Additional Comments:  Zenon Mayo, RN 05/31/2015, 11:13 AM

## 2015-06-03 ENCOUNTER — Encounter: Payer: Self-pay | Admitting: *Deleted

## 2015-06-03 ENCOUNTER — Other Ambulatory Visit: Payer: Self-pay | Admitting: *Deleted

## 2015-06-03 ENCOUNTER — Ambulatory Visit (INDEPENDENT_AMBULATORY_CARE_PROVIDER_SITE_OTHER): Payer: Self-pay | Admitting: *Deleted

## 2015-06-03 DIAGNOSIS — Z09 Encounter for follow-up examination after completed treatment for conditions other than malignant neoplasm: Secondary | ICD-10-CM

## 2015-06-03 DIAGNOSIS — C3431 Malignant neoplasm of lower lobe, right bronchus or lung: Secondary | ICD-10-CM

## 2015-06-03 DIAGNOSIS — L03313 Cellulitis of chest wall: Secondary | ICD-10-CM

## 2015-06-03 DIAGNOSIS — Z4889 Encounter for other specified surgical aftercare: Secondary | ICD-10-CM

## 2015-06-03 MED ORDER — CEPHALEXIN 500 MG PO CAPS: 500.0000 mg | ORAL_CAPSULE | Freq: Two times a day (BID) | ORAL | Status: DC

## 2015-06-03 NOTE — Progress Notes (Signed)
[name] or his wife has called the last two days with concerns with one of the two sutured chest tube sites. It has drained, slight amount, but bloody, and now there is pus coming fron an open area.  I asked them to come to the office. On exam a small amount of pus was expressed from the site.  The site of the previous chest tube is hard but without frank redness. He is afebrile, temp 97.7.  I consulted with Jadene Pierini, P.A.C. He will prescribed a 7 day course of Keflex and be seen on Monday for a recheck.  They agree with this plan.

## 2015-06-07 ENCOUNTER — Ambulatory Visit (INDEPENDENT_AMBULATORY_CARE_PROVIDER_SITE_OTHER): Payer: Self-pay | Admitting: Physician Assistant

## 2015-06-07 VITALS — BP 150/90 | HR 88 | Resp 20 | Ht 69.0 in | Wt 173.0 lb

## 2015-06-07 DIAGNOSIS — C3431 Malignant neoplasm of lower lobe, right bronchus or lung: Secondary | ICD-10-CM

## 2015-06-07 MED ORDER — TRAMADOL HCL 50 MG PO TABS: 50.0000 mg | ORAL_TABLET | Freq: Four times a day (QID) | ORAL | Status: DC | PRN

## 2015-06-07 NOTE — Progress Notes (Signed)
HPI: Patient returns for follow up of drainage and pus from anterior chest wound. He is s/p thoracoscopic right lower lobe lobe superior segmentectomy on 05/24/2015 by Dr. Roxan Hockey. The patient's early postoperative recovery while in the hospital was notable for desaturation with ambulation. There was no evidence of PE, but he had to have 2 liters of oxygen via [place]. Since hospital discharge, the patient reports slight bloody drainage and pus from right anterior chest tube site. He presented to the office on 06/03/2015 and our nurse examined him. After consultation with PA-C Jadene Pierini, he was given a prescription for Keflex and instructed to return today for follow up of this chest tube wound.   Current Outpatient Prescriptions  Medication Sig Dispense Refill  . Albuterol Sulfate (PROAIR RESPICLICK) 106 (90 BASE) MCG/ACT AEPB Inhale 2 puffs into the lungs every 4 (four) hours as needed. 1 each 11  . budesonide-formoterol (SYMBICORT) 80-4.5 MCG/ACT inhaler Inhale 2 puffs into the lungs 2 (two) times daily. 1 Inhaler 5  . cephALEXin (KEFLEX) 500 MG capsule Take 1 capsule (500 mg total) by mouth 2 (two) times daily. 14 capsule 0  . clonazePAM (KLONOPIN) 1 MG tablet Take 1/2 tablet as needed twice a day for anxiety and 1-2 tablets at night as needed for sleep 60 tablet 0  . dexlansoprazole (DEXILANT) 60 MG capsule Take 1 capsule (60 mg total) by mouth daily. 30 capsule 12  . levocetirizine (XYZAL) 5 MG tablet Take 1 tablet (5 mg total) by mouth every evening. 30 tablet 11  . metoprolol tartrate (LOPRESSOR) 25 MG tablet Take 0.5 tablets (12.5 mg total) by mouth 2 (two) times daily. 30 tablet 1  . montelukast (SINGULAIR) 10 MG tablet Take 1 tablet (10 mg total) by mouth at bedtime. 30 tablet 3  . pravastatin (PRAVACHOL) 40 MG tablet Take 1 tablet (40 mg total) by mouth at bedtime. 90 tablet 0  . traMADol (ULTRAM) 50 MG tablet Take 1-2 tablets (50-100 mg total) by mouth every 6 (six) hours as needed  for moderate pain (mild pain). 14 tablet 0  . triamcinolone (NASACORT ALLERGY 24HR) 55 MCG/ACT AERO nasal inhaler Place 2 sprays into the nose at bedtime. 1 Inhaler 2  Vital Signs: BP 150/90, HR 88, RR 20, Oxygen saturation 97% on 2 liters via [place]   Physical Exam: CV-RRR Pulmonary-Clear to auscultation bilaterally Wounds-Clean and dry. Anterior chest tube suture mostly pulled through so I cut it out. There was slight bloody drainage and a little purulence. Wound was cleaned with peroxide. Dry dressing and tape was applied. Posterior chest tube suture was removed as well.   Impression and Plan: Elijah Marshall was instructed to continue to cleanse anterior chest tube wound with peroxide daily. He is then to apply a dry gauze and tape. He is to change dressing daily. He is to finish Keflex. If he develops any more purulence, increased drainage, of fever, he is to call the office as soon as possible. He has a follow up appointment to see Dr. Roxan Hockey on 06/15/2015 at 10:00 am. Our nurse did walk him on room air, but he did desat to 88%. He still requires oxygen via [place] for now. Hopefully, he will be able to be weaned off of it soon. Barnetta Chapel was put on Lopressor 12.5 mg bid for tachycardia and hypertension post op.  I did inform his wife that he is still hypertensive. His wife states he has an appointment to see his medical doctor sometime this month.  Nani Skillern, PA-C [facility] [phone]

## 2015-06-14 ENCOUNTER — Other Ambulatory Visit: Payer: Self-pay | Admitting: *Deleted

## 2015-06-14 ENCOUNTER — Encounter: Payer: Self-pay | Admitting: *Deleted

## 2015-06-14 DIAGNOSIS — C3431 Malignant neoplasm of lower lobe, right bronchus or lung: Secondary | ICD-10-CM

## 2015-06-15 ENCOUNTER — Encounter: Payer: Self-pay | Admitting: Thoracic Surgery (Cardiothoracic Vascular Surgery)

## 2015-06-15 ENCOUNTER — Ambulatory Visit
Admission: RE | Admit: 2015-06-15 | Discharge: 2015-06-15 | Disposition: A | Discharge status: Home / Self Care | Payer: Managed Care, Other (non HMO) | Source: Ambulatory Visit | Attending: Thoracic Surgery (Cardiothoracic Vascular Surgery) | Admitting: Thoracic Surgery (Cardiothoracic Vascular Surgery)

## 2015-06-15 ENCOUNTER — Ambulatory Visit (INDEPENDENT_AMBULATORY_CARE_PROVIDER_SITE_OTHER): Payer: Self-pay | Admitting: Thoracic Surgery (Cardiothoracic Vascular Surgery)

## 2015-06-15 VITALS — BP 146/92 | HR 88 | Resp 16 | Ht 69.0 in | Wt 173.0 lb

## 2015-06-15 DIAGNOSIS — C3431 Malignant neoplasm of lower lobe, right bronchus or lung: Secondary | ICD-10-CM

## 2015-06-15 DIAGNOSIS — Z09 Encounter for follow-up examination after completed treatment for conditions other than malignant neoplasm: Secondary | ICD-10-CM

## 2015-06-15 MED ORDER — TRAMADOL HCL 50 MG PO TABS: 50.0000 mg | ORAL_TABLET | Freq: Four times a day (QID) | ORAL | Status: DC | PRN

## 2015-06-15 NOTE — Progress Notes (Signed)
[address]       [place] [address]             [phone]       HPI: Elijah Marshall returns for scheduled postoperative follow-up visit.  He is a 65 year old gentleman with a history of tobacco abuse and COPD had a right lower lobe superior segmentectomy on 05/25/2015. He had a T1a, N0, stage IA non-small cell carcinoma. His initial postoperative course was remarkable for inability to wean oxygen. He had a CT which showed no evidence of pulmonary embolus. He was discharged on home oxygen at 2 L nasal cannula.  He was seen in the office last week with some drainage from a chest tube site. He was treated with Keflex.  He feels better today. He still has some incisional pain. He has been taking tramadol for that. He also complains of some pain in his right upper abdominal wall. He is not having any more drainage from the chest tube site and the tenderness around that area has improved as well. He still is using 2 L nasal cannula oxygen.  Past Medical History  Diagnosis Date  . Allergy   . Ulcer   . COPD (chronic obstructive pulmonary disease) ([place])     per patient's health survey - he put a ?  . GERD (gastroesophageal reflux disease)   . Arthritis   . Shortness of breath   . Hypertension   . Depression   . Cancer [facility])     unsure at this time  . Cancer of lower lobe of right lung [facility])       Current Outpatient Prescriptions  Medication Sig Dispense Refill  . Albuterol Sulfate (PROAIR RESPICLICK) 035 (90 BASE) MCG/ACT AEPB Inhale 2 puffs into the lungs every 4 (four) hours as needed. 1 each 11  . budesonide-formoterol (SYMBICORT) 80-4.5 MCG/ACT inhaler Inhale 2 puffs into the lungs 2 (two) times daily. 1 Inhaler 5  . clonazePAM (KLONOPIN) 1 MG tablet Take 1/2 tablet as needed twice a day for anxiety and 1-2 tablets at night as needed for sleep 60 tablet 0  . dexlansoprazole (DEXILANT) 60 MG capsule Take 1 capsule (60 mg total) by mouth daily. 30 capsule 12    . levocetirizine (XYZAL) 5 MG tablet Take 1 tablet (5 mg total) by mouth every evening. 30 tablet 11  . metoprolol tartrate (LOPRESSOR) 25 MG tablet Take 0.5 tablets (12.5 mg total) by mouth 2 (two) times daily. 30 tablet 1  . montelukast (SINGULAIR) 10 MG tablet Take 1 tablet (10 mg total) by mouth at bedtime. 30 tablet 3  . pravastatin (PRAVACHOL) 40 MG tablet Take 1 tablet (40 mg total) by mouth at bedtime. 90 tablet 0  . traMADol (ULTRAM) 50 MG tablet Take 1-2 tablets (50-100 mg total) by mouth every 6 (six) hours as needed for moderate pain (mild pain). 40 tablet 0  . triamcinolone (NASACORT ALLERGY 24HR) 55 MCG/ACT AERO nasal inhaler Place 2 sprays into the nose at bedtime. 1 Inhaler 2   No current facility-administered medications for this visit.    Physical Exam BP 146/92 mmHg  Pulse 88  Resp 16  Ht '5\' 9"'$  (1.753 m)  Wt 173 lb (78.472 kg)  BMI 25.54 kg/m2  SpO2 98% Well-appearing 65 year old man in no acute distress. Nasal cannula oxygen in place Alert and oriented 3 with no focal deficits Lungs diminished bilaterally but clear, no wheezing Cardiac regular rate and rhythm normal S1 and S2  Diagnostic Tests: CHEST 2 VIEW  COMPARISON: 05/29/2015  FINDINGS: Normal heart size. Patchy right midlung density has improved. Vascular congestion has improved. No pleural effusion. Tiny right apical pneumothorax is stable.  IMPRESSION: Improved vascular congestion.  Improved patchy right mid lung airspace disease.  Stable tiny right apical pneumothorax.   Electronically Signed  By: Marybelle Killings M.D.  On: 06/15/2015 09:51   Impression: 65 year old man who is now about 3 weeks out from a thoracoscopic right lower lobe superior segmentectomy for stage IA non-small cell carcinoma.  His incisions are healing well. He does still have some incisional pain. It gave him another prescription for tramadol 50-100 mg every 6 hours as needed for pain, dispense 40 tablets,  no refills. He may also use acetaminophen or ibuprofen in addition to or instead of the tramadol.  There are no activities on his restrictions but he was advised to build and gradually.  We will check a sat on room air today and have home health see if they can wean his oxygen.  I did recommend he follow-up with Dr. Melvyn Novas in the near future.  I think he would benefit from pulmonary rehabilitation.  I'm going to set him up for an appointment in our multidisciplinary thoracic oncology clinic to see Dr. Julien Nordmann  Plan: Wean O2  Pulmonary rehabilitation  Appointment in [place]  Follow-up in 2 months with PA and lateral chest x-ray  [name], MD [facility] [phone]

## 2015-06-16 ENCOUNTER — Telehealth: Payer: Self-pay | Admitting: *Deleted

## 2015-06-16 DIAGNOSIS — R918 Other nonspecific abnormal finding of lung field: Secondary | ICD-10-CM

## 2015-06-16 NOTE — Telephone Encounter (Signed)
Oncology Nurse Navigator Documentation  Oncology Nurse Navigator Flowsheets 06/16/2015  Navigator Encounter Type Telephone  Telephone Outgoing Call  Treatment Phase Pre-Tx/Tx Discussion  Barriers/Navigation Needs Coordination of Care  Interventions Coordination of Care  Coordination of Care Appts  Acuity Level 1  Acuity Level 1 -  Time Spent with Patient 15   I received a referral on Elijah Marshall yesterday.  I called and scheduled him to be seen at [facility] on 06/24/15 arrive at 3:30.  Patient verbalized understanding of appt time and place.

## 2015-06-18 ENCOUNTER — Telehealth: Payer: Self-pay | Admitting: *Deleted

## 2015-06-18 NOTE — Telephone Encounter (Signed)
Mailed new pt letter w/ calendar to pt.

## 2015-06-22 ENCOUNTER — Encounter: Payer: Self-pay | Admitting: *Deleted

## 2015-06-22 LAB — FUNGUS CULTURE WITH STAIN

## 2015-06-22 LAB — FUNGUS CULTURE RESULT

## 2015-06-22 LAB — FUNGAL ORGANISM REFLEX

## 2015-06-22 NOTE — Progress Notes (Signed)
Patient ID: Elijah Marshall, male   DOB: 06/17/1950, 65 y.o.   MRN: [id number] A referral was faxed to [place] to contact Mr. Gaulin for the program.  Also, a request was faxed to [place] to perform an overnight oxygen study to assess his continued need for O2. These requests were ordered by Dr. Modesto Charon.

## 2015-06-23 ENCOUNTER — Telehealth: Payer: Self-pay | Admitting: *Deleted

## 2015-06-23 NOTE — Telephone Encounter (Signed)
Called and confirmed 06/24/15 clinic appt w/ pt.

## 2015-06-24 ENCOUNTER — Ambulatory Visit (HOSPITAL_BASED_OUTPATIENT_CLINIC_OR_DEPARTMENT_OTHER): Payer: Managed Care, Other (non HMO) | Admitting: Internal Medicine

## 2015-06-24 ENCOUNTER — Encounter: Payer: Self-pay | Admitting: *Deleted

## 2015-06-24 ENCOUNTER — Other Ambulatory Visit (HOSPITAL_BASED_OUTPATIENT_CLINIC_OR_DEPARTMENT_OTHER): Payer: Managed Care, Other (non HMO)

## 2015-06-24 ENCOUNTER — Ambulatory Visit (INDEPENDENT_AMBULATORY_CARE_PROVIDER_SITE_OTHER): Payer: Managed Care, Other (non HMO) | Admitting: Family Medicine

## 2015-06-24 ENCOUNTER — Other Ambulatory Visit: Payer: Managed Care, Other (non HMO)

## 2015-06-24 ENCOUNTER — Encounter: Payer: Self-pay | Admitting: Internal Medicine

## 2015-06-24 ENCOUNTER — Encounter: Payer: Self-pay | Admitting: Family Medicine

## 2015-06-24 ENCOUNTER — Telehealth: Payer: Self-pay | Admitting: Internal Medicine

## 2015-06-24 ENCOUNTER — Other Ambulatory Visit: Payer: Self-pay | Admitting: Medical Oncology

## 2015-06-24 VITALS — BP 158/84 | HR 82 | Temp 98.3°F | Ht 69.0 in | Wt 170.8 lb

## 2015-06-24 VITALS — BP 143/85 | HR 78 | Temp 98.6°F | Resp 16 | Ht 69.0 in | Wt 170.0 lb

## 2015-06-24 DIAGNOSIS — K5900 Constipation, unspecified: Secondary | ICD-10-CM | POA: Diagnosis not present

## 2015-06-24 DIAGNOSIS — F4323 Adjustment disorder with mixed anxiety and depressed mood: Secondary | ICD-10-CM | POA: Diagnosis not present

## 2015-06-24 DIAGNOSIS — Z902 Acquired absence of lung [part of]: Secondary | ICD-10-CM

## 2015-06-24 DIAGNOSIS — E785 Hyperlipidemia, unspecified: Secondary | ICD-10-CM | POA: Diagnosis not present

## 2015-06-24 DIAGNOSIS — J449 Chronic obstructive pulmonary disease, unspecified: Secondary | ICD-10-CM

## 2015-06-24 DIAGNOSIS — C3491 Malignant neoplasm of unspecified part of right bronchus or lung: Secondary | ICD-10-CM

## 2015-06-24 DIAGNOSIS — C3431 Malignant neoplasm of lower lobe, right bronchus or lung: Secondary | ICD-10-CM | POA: Diagnosis not present

## 2015-06-24 DIAGNOSIS — R5383 Other fatigue: Secondary | ICD-10-CM | POA: Diagnosis not present

## 2015-06-24 DIAGNOSIS — J329 Chronic sinusitis, unspecified: Secondary | ICD-10-CM

## 2015-06-24 DIAGNOSIS — J310 Chronic rhinitis: Secondary | ICD-10-CM | POA: Diagnosis not present

## 2015-06-24 DIAGNOSIS — R918 Other nonspecific abnormal finding of lung field: Secondary | ICD-10-CM

## 2015-06-24 DIAGNOSIS — I10 Essential (primary) hypertension: Secondary | ICD-10-CM

## 2015-06-24 HISTORY — DX: Malignant neoplasm of unspecified part of right bronchus or lung: C34.91

## 2015-06-24 LAB — CBC WITH DIFFERENTIAL/PLATELET
BASO%: 0.6 % (ref 0.0–2.0)
Basophils Absolute: 0 10*3/uL (ref 0.0–0.1)
EOS%: 3 % (ref 0.0–7.0)
Eosinophils Absolute: 0.2 10*3/uL (ref 0.0–0.5)
HCT: 46.2 % (ref 38.4–49.9)
HGB: 15.1 g/dL (ref 13.0–17.1)
LYMPH%: 33.4 % (ref 14.0–49.0)
MCH: 28.8 pg (ref 27.2–33.4)
MCHC: 32.7 g/dL (ref 32.0–36.0)
MCV: 87.9 fL (ref 79.3–98.0)
MONO#: 0.8 10*3/uL (ref 0.1–0.9)
MONO%: 11.7 % (ref 0.0–14.0)
NEUT#: 3.5 10*3/uL (ref 1.5–6.5)
NEUT%: 51.3 % (ref 39.0–75.0)
Platelets: 230 10*3/uL (ref 140–400)
RBC: 5.26 10*6/uL (ref 4.20–5.82)
RDW: 13.6 % (ref 11.0–14.6)
WBC: 6.9 10*3/uL (ref 4.0–10.3)
lymph#: 2.3 10*3/uL (ref 0.9–3.3)

## 2015-06-24 LAB — COMPREHENSIVE METABOLIC PANEL
ALT: 17 U/L (ref 0–55)
AST: 19 U/L (ref 5–34)
Albumin: 4.1 g/dL (ref 3.5–5.0)
Alkaline Phosphatase: 114 U/L (ref 40–150)
Anion Gap: 8 mEq/L (ref 3–11)
BUN: 5.5 mg/dL — ABNORMAL LOW (ref 7.0–26.0)
CO2: 28 mEq/L (ref 22–29)
Calcium: 9.6 mg/dL (ref 8.4–10.4)
Chloride: 104 mEq/L (ref 98–109)
Creatinine: 1.1 mg/dL (ref 0.7–1.3)
EGFR: 86 mL/min/{1.73_m2} — ABNORMAL LOW (ref >90)
Glucose: 95 mg/dl (ref 70–140)
Potassium: 4 mEq/L (ref 3.5–5.1)
Sodium: 140 mEq/L (ref 136–145)
Total Bilirubin: 0.37 mg/dL (ref 0.20–1.20)
Total Protein: 7.3 g/dL (ref 6.4–8.3)

## 2015-06-24 MED ORDER — HYDROCODONE-ACETAMINOPHEN 5-325 MG PO TABS: 1.0000 | ORAL_TABLET | Freq: Four times a day (QID) | ORAL | Status: DC | PRN

## 2015-06-24 MED ORDER — SENNA 8.6 MG PO TABS: 2.0000 | ORAL_TABLET | Freq: Every day | ORAL | Status: DC

## 2015-06-24 NOTE — Telephone Encounter (Signed)
per pof to sch pt appt-*gave pt copy of avs-adv central sch wikll call and sch scan

## 2015-06-24 NOTE — Progress Notes (Signed)
Oncology Nurse Navigator Documentation  Oncology Nurse Navigator Flowsheets 06/24/2015  Navigator Encounter Type Clinic/MDC  Abnormal Finding Date 03/29/2015  Confirmed Diagnosis Date 05/24/2015  Surgery Date 05/24/2015  Treatment Initiated Date 05/24/2015  Treatment Phase Post-Tx Follow-up  Barriers/Navigation Needs Coordination of Care;Education  Education Newly Diagnosed Cancer Education  Interventions Education Method;Coordination of Care;Referrals  Referrals Rehab  Coordination of Care Appts  Education Method Verbal;Written  Acuity Level 2  Acuity Level 2 Educational needs;Referrals such as genetics, survivorship  Time Spent with Patient 81   Spoke with patient and wife today at [facility].  Gave and explained information on disease, tx, staging, and resources.    Per Dr. Julien Nordmann he would like him to be referred to PT.  I made referral.

## 2015-06-24 NOTE — Patient Instructions (Signed)
     IF you received an x-ray today, you will receive an invoice from [facility]. Please contact [facility] at [phone] with questions or concerns regarding your invoice.   IF you received labwork today, you will receive an invoice from [facility]. Please contact [facility] at [phone] with questions or concerns regarding your invoice.   Our billing staff will not be able to assist you with questions regarding bills from these companies.  You will be contacted with the lab results as soon as they are available. The fastest way to get your results is to activate your My Chart account. Instructions are located on the last page of this paperwork. If you have not heard from us regarding the results in 2 weeks, please contact this office.      

## 2015-06-24 NOTE — Progress Notes (Signed)
[place] [phone]   [phone] Multidisciplinary thoracic oncology clinic  CONSULT NOTE  REFERRING PHYSICIAN: Dr. Modesto Charon  REASON FOR CONSULTATION:  65 years old African-American male recently diagnosed with lung cancer.  HPI Elijah Marshall is a 65 y.o. male with past medical history significant for COPD, chronic rhinitis, GERD, dyslipidemia, hypertension and chronic sinusitis. The patient was followed by Dr. Melvyn Novas for his COPD and chronic rhinitis. Chest x-ray was performed on 03/29/2015. It showed 1.3 cm nodular opacity projecting in the right midlung just above the minor fissure. A PET scan was performed on 05/03/2015 and it showed intense radiotracer uptake associated with the subpleural nodule in the superior segment of the right lower lobe. The findings are worrisome for primary bronchogenic carcinoma. There was no evidence for hypermetabolic hilar or mediastinal lymph node metastasis or distant metastatic disease. The patient was referred to Dr. Roxan Hockey and on 05/24/2015 he underwent right VATS with right lower lobe wedge resection and biopsy of the right middle lobe in addition to mediastinal lymph node dissection. The final pathology (Accession: [id number]) showed invasive squamous cell carcinoma, moderately differentiated, spanning 1.3 cm. The tumor was limited to the lung parenchyma and the final resection margin was negative. There was no evidence for pleural or lymphovascular invasion. And the dissected lymph nodes were negative for malignancy. The patient is recovering well from his surgery except for the soreness on the right side of the chest. He denied having any significant shortness breath and he is currently weaned off home oxygen. He denied having any significant cough or hemoptysis. He lost around 3 pounds. He denied having any headache or visual changes. Dr. Roxan Hockey kindly referred the patient to the  multidisciplinary thoracic oncology clinic today for evaluation and discussion of adjuvant therapy as needed. Family history significant for father died in his 69s with heart disease, mother had colon cancer at age 44 and sister had breast cancer. The patient is married and has 3 living children and one deceased. He was accompanied by his wife [name]. He used to work as a Corporate investment banker. He has a history of smoking 1 pack per day for around 24 years and quit in 2013. He also drinks alcohol socially was no history of drug abuse.  HPI  Past Medical History  Diagnosis Date  . Allergy   . Ulcer   . COPD (chronic obstructive pulmonary disease) ([place])     per patient's health survey - he put a ?  . GERD (gastroesophageal reflux disease)   . Arthritis   . Shortness of breath   . Hypertension   . Depression   . Cancer [facility])     unsure at this time  . Cancer of lower lobe of right lung [facility])     Past Surgical History  Procedure Laterality Date  . Spine surgery  2012  . Colonoscopy  14    polyps rem  . Joint replacement Right 2010  . Anterior fusion lumbar spine  07/25/2012  . Anterior lat lumbar fusion Left 07/25/2012    Procedure: ANTERIOR LATERAL LUMBAR FUSION 1 LEVEL;  Surgeon: [name], MD;  Location: [place];  Service: Orthopedics;  Laterality: Left;  Left sided lumbar 3-4 lateral interbody fusion with allograft and instrumentation  . Video assisted thoracoscopy (vats)/wedge resection Right 05/24/2015    Procedure: RIGHT VIDEO ASSISTED THORACOSCOPY (VATS)/ RIGHTR LOWER LOBE WEDGE RESECTION;  Surgeon: [name], MD;  Location: [place];  Service: Thoracic;  Laterality: Right;  . Segmentecomy Right 05/24/2015    Procedure: RIGHT LOWER LOBE SUPERIOR SEGMENTECTOMY;  Surgeon: [name], MD;  Location: [place];  Service: Thoracic;  Laterality: Right;  . Node dissection Right 05/24/2015    Procedure: NODE DISSECTION;  Surgeon: [name], MD;   Location: [place];  Service: Thoracic;  Laterality: Right;    Family History  Problem Relation Age of Onset  . Cancer Mother     colon  . Heart disease Father   . Diabetes Sister   . Hypertension Sister   . Hypertension Brother   . Heart disease Maternal Grandmother     heart attack    Social History Social History  Substance Use Topics  . Smoking status: Former Smoker -- 1.00 packs/day for 35 years    Types: Cigarettes    Quit date: 09/24/2011  . Smokeless tobacco: Never Used  . Alcohol Use: 0.0 oz/week    0 Standard drinks or equivalent per week     Comment: weekly    Allergies  Allergen Reactions  . Aspirin Nausea And Vomiting  . Morphine And Related Itching    Current Outpatient Prescriptions  Medication Sig Dispense Refill  . budesonide-formoterol (SYMBICORT) 80-4.5 MCG/ACT inhaler Inhale 2 puffs into the lungs 2 (two) times daily. 1 Inhaler 5  . clonazePAM (KLONOPIN) 1 MG tablet Take 1/2 tablet as needed twice a day for anxiety and 1-2 tablets at night as needed for sleep 60 tablet 0  . dexlansoprazole (DEXILANT) 60 MG capsule Take 1 capsule (60 mg total) by mouth daily. 30 capsule 12  . HYDROcodone-acetaminophen (NORCO/VICODIN) 5-325 MG tablet Take 1-2 tablets by mouth every 6 (six) hours as needed for moderate pain. 75 tablet 0  . levocetirizine (XYZAL) 5 MG tablet Take 1 tablet (5 mg total) by mouth every evening. 30 tablet 11  . metoprolol tartrate (LOPRESSOR) 25 MG tablet Take 0.5 tablets (12.5 mg total) by mouth 2 (two) times daily. 30 tablet 1  . montelukast (SINGULAIR) 10 MG tablet Take 1 tablet (10 mg total) by mouth at bedtime. 30 tablet 3  . pravastatin (PRAVACHOL) 40 MG tablet Take 1 tablet (40 mg total) by mouth at bedtime. 90 tablet 0  . senna (SENOKOT) 8.6 MG TABS tablet Take 2 tablets (17.2 mg total) by mouth at bedtime. 120 each 0  . Albuterol Sulfate (PROAIR RESPICLICK) 270 (90 BASE) MCG/ACT AEPB Inhale 2 puffs into the lungs every 4 (four) hours as  needed. (Patient not taking: Reported on 06/24/2015) 1 each 11  . traMADol (ULTRAM) 50 MG tablet Take 50 mg by mouth every 6 (six) hours as needed. Reported on 06/24/2015  0   No current facility-administered medications for this visit.    Review of Systems  Constitutional: negative Eyes: negative Ears, nose, mouth, throat, and face: negative Respiratory: positive for pleurisy/chest pain Cardiovascular: negative Gastrointestinal: negative Genitourinary:negative Integument/breast: negative Hematologic/lymphatic: negative Musculoskeletal:negative Neurological: negative Behavioral/Psych: negative Endocrine: negative Allergic/Immunologic: negative  Physical Exam  WCB:JSEGB, healthy, no distress, well nourished and well developed SKIN: skin color, texture, turgor are normal, no rashes or significant lesions HEAD: Normocephalic, No masses, lesions, tenderness or abnormalities EYES: normal, PERRLA, Conjunctiva are pink and non-injected EARS: External ears normal, Canals clear OROPHARYNX:no exudate, no erythema and lips, buccal mucosa, and tongue normal  NECK: supple, no adenopathy, no JVD LYMPH:  no palpable lymphadenopathy, no hepatosplenomegaly LUNGS: clear to auscultation , and palpation HEART: regular rate & rhythm, no murmurs and no gallops ABDOMEN:abdomen soft, non-tender,  normal bowel sounds and no masses or organomegaly BACK: Back symmetric, no curvature., No CVA tenderness EXTREMITIES:no joint deformities, effusion, or inflammation, no edema, no skin discoloration  NEURO: alert & oriented x 3 with fluent speech, no focal motor/sensory deficits  PERFORMANCE STATUS: ECOG 1  LABORATORY DATA: Lab Results  Component Value Date   WBC 6.9 06/24/2015   HGB 15.1 06/24/2015   HCT 46.2 06/24/2015   MCV 87.9 06/24/2015   PLT 230 06/24/2015      Chemistry      Component Value Date/Time   NA 140 06/24/2015 1534   NA 136 05/26/2015 0453   K 4.0 06/24/2015 1534   K 4.1  05/26/2015 0453   CL 102 05/26/2015 0453   CO2 28 06/24/2015 1534   CO2 27 05/26/2015 0453   BUN 5.5* 06/24/2015 1534   BUN <5* 05/26/2015 0453   CREATININE 1.1 06/24/2015 1534   CREATININE 0.93 05/26/2015 0453   CREATININE 1.00 04/15/2015 0829      Component Value Date/Time   CALCIUM 9.6 06/24/2015 1534   CALCIUM 8.7* 05/26/2015 0453   ALKPHOS 114 06/24/2015 1534   ALKPHOS 65 05/26/2015 0453   AST 19 06/24/2015 1534   AST 33 05/26/2015 0453   ALT 17 06/24/2015 1534   ALT 25 05/26/2015 0453   BILITOT 0.37 06/24/2015 1534   BILITOT 0.9 05/26/2015 0453       RADIOGRAPHIC STUDIES: Dg Chest 2 View  06/15/2015  CLINICAL DATA:  Squamous cell carcinoma EXAM: CHEST  2 VIEW COMPARISON:  05/29/2015 FINDINGS: Normal heart size. Patchy right midlung density has improved. Vascular congestion has improved. No pleural effusion. Tiny right apical pneumothorax is stable. IMPRESSION: Improved vascular congestion. Improved patchy right mid lung airspace disease. Stable tiny right apical pneumothorax. Electronically Signed   By: Marybelle Killings M.D.   On: 06/15/2015 09:51   Dg Chest 2 View  05/29/2015  CLINICAL DATA:  Followup pneumothorax. EXAM: CHEST  2 VIEW COMPARISON:  05/28/2015 FINDINGS: Tiny right apical pneumothorax may be slightly smaller than it was on the prior day's study, although the change may be due to changes in patient positioning only. It has not increased in size. Persistent irregular interstitial and hazy airspace opacity noted in the right mid to lower lung. Mild linear atelectasis at the left lung base. Chronic interstitial thickening. These findings are stable. No new lung abnormalities. Small right pleural effusion. IMPRESSION: 1. Tiny right apical pneumothorax is either stable or slightly decreased in size the previous day's study. 2. No other change. Persistent patchy interstitial airspace opacity in the right mid to lower lung. Small right effusion. Electronically Signed   By: Lajean Manes M.D.   On: 05/29/2015 07:38   Dg Chest 2 View  05/28/2015  CLINICAL DATA:  History of pneumothorax EXAM: CHEST  2 VIEW COMPARISON:  Yesterday FINDINGS: Normal heart size. Tiny right pneumothorax has developed. Airspace disease in the superior segment of the right lower lobe has increased. Linear atelectasis at the left base is stable. IMPRESSION: Tiny right apical pneumothorax has developed. Right lower lobe airspace disease has increased. Electronically Signed   By: Marybelle Killings M.D.   On: 05/28/2015 08:04   Ct Angio Chest Pe W/cm &/or Wo Cm  05/30/2015  CLINICAL DATA:  PT 4 DAYS POST OP RLL THORACOSCOPY NOW HAVING SHOB AND CP EXAM: CT ANGIOGRAPHY CHEST WITH CONTRAST TECHNIQUE: Multidetector CT imaging of the chest was performed using the standard protocol during bolus administration of intravenous contrast. Multiplanar CT  image reconstructions and MIPs were obtained to evaluate the vascular anatomy. CONTRAST:  100 cc Isovue COMPARISON:  PET-CT 05/03/2015 FINDINGS: Mediastinum/Nodes: No axillary or supraclavicular adenopathy. No mediastinal hilar adenopathy. No pericardial fluid. No filling defects within pulmonary suggest acute pulmonary embolism. No acute findings aorta great vessels. Lungs/Pleura: There is new soft tissue in the azygos esophageal recess with scattered small pockets of gas (image 77, series 4). This soft tissue thickening is along the surgical suture line (image 80, series 8). There several gas locules in the pleural fluid in the RIGHT lower lobe. There is rounded atelectasis in the RIGHT lower lobe additionally (image 98, series 5). No pneumothorax present.  Centrilobular emphysema the upper lobes. There is some gas along the subcutaneous and muscle layers of the RIGHT lateral chest wall consistent recent prior fluoroscopy. Upper abdomen: Unremarkable Musculoskeletal: Again gas along the tissue planes of the RIGHT chest wall consists with recent surgery. Sclerotic lesion in the  lower thoracic spine was not hypermetabolic comparison PET-CT scan (image 81, series 9 Review of the MIP images confirms the above findings. IMPRESSION: 1. Postsurgical change in the RIGHT lower lobe with some solid tissue and gas in the azygos esophageal recess medial to the surgical margin. No pneumothorax. 2. Gas along the RIGHT lateral chest wall consistent recent surgery. 3. No pulmonary embolism. Electronically Signed   By: Suzy Bouchard M.D.   On: 05/30/2015 13:24   Dg Chest Port 1 View  05/27/2015  CLINICAL DATA:  Status post removal of right chest tube today. History of resection of the superior segment of the right lower lobe 05/24/2015. EXAM: PORTABLE CHEST 1 VIEW COMPARISON:  Single view of the chest earlier today. FINDINGS: Right IJ catheter and right chest tube have been removed. No pneumothorax is identified. Bibasilar atelectasis appears improved. The lungs are emphysematous. Heart size is upper normal. IMPRESSION: Negative for pneumothorax after right chest tube removal. Emphysema. Improved basilar atelectasis. Electronically Signed   By: Inge Rise M.D.   On: 05/27/2015 16:09   Dg Chest Port 1 View  05/27/2015  CLINICAL DATA:  Follow-up pneumothorax status post video-assisted thoracoscopy and wedge resection of the right lower lobe mass. EXAM: PORTABLE CHEST 1 VIEW COMPARISON:  Portable chest x-ray of May 26, 2015 FINDINGS: A persistent approximately 5% right apical pneumothorax is demonstrated. The medial right chest tube has been removed. The upper right chest tube is stable with its tip projecting in the interspace between the posterior aspects of the right fifth and sixth ribs. There is persistent subsegmental atelectasis in the right mid and lower lung. The left lung is reasonably well inflated. There is persistent atelectasis at the left lung base. The heart is mildly enlarged. The pulmonary vascularity is not clearly engorged. The bony thorax exhibits no acute abnormality.  The right internal jugular venous catheter tip projects over the midportion of the SVC. IMPRESSION: 1. Stable 5% or less right apical pneumothorax. 2. Persistent bilateral subsegmental atelectasis. No significant pleural effusion. Electronically Signed   By: David  Martinique M.D.   On: 05/27/2015 08:01   Dg Chest Port 1 View  05/26/2015  CLINICAL DATA:  Right VATS, pain at chest tube site. EXAM: PORTABLE CHEST 1 VIEW COMPARISON:  05/25/2015 and CT chest 04/08/2015. FINDINGS: Right IJ central line tip projects over the SVC. 2 right chest tubes remain in place. Trachea is midline. Heart size within normal limits. Postoperative changes in the right hemithorax with a platelike density in the mid right hemi thorax. Left basilar subsegmental  atelectasis. Tiny right apical pneumothorax. No definite pleural fluid. Subcutaneous emphysema is seen along the lower lateral right chest wall. IMPRESSION: 1. Tiny right apical pneumothorax with 2 chest tubes in place. 2. Increasing platelike density in the mid right hemi thorax, likely atelectasis. 3. Left basilar subsegmental atelectasis. Electronically Signed   By: Lorin Picket M.D.   On: 05/26/2015 07:39    ASSESSMENT: This is a very pleasant 65 years old African-American male recently diagnosed with a stage IA (T1a, N0, M0) non-small cell lung cancer, moderately differentiated squamous cell carcinoma presented with right lower lobe lung nodule status post wedge resection of the right lower lobe in April 2014.  PLAN: I had a lengthy discussion with the patient and his wife today about his current disease stage, prognosis and treatment options. I explained to the patient that he has early stage disease and the median 5 years is around 80% for patient with his disease is stage. I also explained to the patient that there is no survival benefit for adjuvant systemic chemotherapy in his stage of the disease. I recommended for the patient to continue on observation with  close monitoring. I will see him back for follow-up visit in 6 months for evaluation and repeat CT scan of the chest. The patient his wife agreed to the current plan. He was seen during the multiple supra thoracic oncology clinic today by medical oncology, thoracic navigator and social worker. The patient was advised to call immediately if he has any concerning symptoms in the interval. The patient voices understanding of current disease status and treatment options and is in agreement with the current care plan.  All questions were answered. The patient knows to call the clinic with any problems, questions or concerns. We can certainly see the patient much sooner if necessary.  Thank you so much for allowing me to participate in the care of DELAND SLOCUMB. I will continue to follow up the patient with you and assist in his care.  I spent 40 minutes counseling the patient face to face. The total time spent in the appointment was 60 minutes.  Disclaimer: This note was dictated with voice recognition software. Similar sounding words can inadvertently be transcribed and may not be corrected upon review.   Kiaan Overholser K. Jun 24, 2015, 4:28 PM

## 2015-06-24 NOTE — Progress Notes (Signed)
Subjective:    Patient ID: Elijah Marshall, male    DOB: 06-Feb-1951, 65 y.o.   MRN: [id number] Chief Complaint  Patient presents with  . Follow-up  . Hypertension    HPI  Elijah Marshall is here today to follow-up since his RLLectomy for stage I non-small cell lung cancer.  He is doing quite well.  He is scheduled to start pulmonary rehab today and of course has some trepidation about this - not sure what it will entail. Surgery went well.  Bp medicaton was started before and after surgery. A little hoarse And sore in the mid abdomen -  Tol po well.  Nml GU, but is constipated.  Once a day miralax without much benefit. No orthostatic sxs. Nasacort stopped and his headache improved Tramadol  - takes 2 to get any sort of relief from the abdomina pain. Incision site healing well.  He does still having more pain after sitting up for a long time.  Does not have a home BP cuff.  Depression screen [facility] 2/9 06/24/2015 04/15/2015 01/15/2015 10/20/2014 07/14/2014  Decreased Interest 0 0 0 0 0  Down, Depressed, Hopeless 1 2 0 0 0  PHQ - 2 Score 1 2 0 0 0  Altered sleeping - 2 - - -  Tired, decreased energy - 3 - - -  Change in appetite - 0 - - -  Feeling bad or failure about yourself  - 3 - - -  Trouble concentrating - 0 - - -  Moving slowly or fidgety/restless - 1 - - -  Suicidal thoughts - 1 - - -  PHQ-9 Score - 12 - - -     Past Medical History  Diagnosis Date  . Allergy   . Ulcer   . COPD (chronic obstructive pulmonary disease) ([place])     per patient's health survey - he put a ?  . GERD (gastroesophageal reflux disease)   . Arthritis   . Shortness of breath   . Hypertension   . Depression   . Cancer [facility])     unsure at this time  . Cancer of lower lobe of right lung ([place])   . Non-small cell carcinoma of lung, right ([place]) 06/24/2015   Past Surgical History  Procedure Laterality Date  . Spine surgery  2012  . Colonoscopy  14    polyps rem  . Joint replacement Right 2010  .  Anterior fusion lumbar spine  07/25/2012  . Anterior lat lumbar fusion Left 07/25/2012    Procedure: ANTERIOR LATERAL LUMBAR FUSION 1 LEVEL;  Surgeon: [name], MD;  Location: [place];  Service: Orthopedics;  Laterality: Left;  Left sided lumbar 3-4 lateral interbody fusion with allograft and instrumentation  . Video assisted thoracoscopy (vats)/wedge resection Right 05/24/2015    Procedure: RIGHT VIDEO ASSISTED THORACOSCOPY (VATS)/ RIGHTR LOWER LOBE WEDGE RESECTION;  Surgeon: [name], MD;  Location: [place];  Service: Thoracic;  Laterality: Right;  . Segmentecomy Right 05/24/2015    Procedure: RIGHT LOWER LOBE SUPERIOR SEGMENTECTOMY;  Surgeon: [name], MD;  Location: [place];  Service: Thoracic;  Laterality: Right;  . Node dissection Right 05/24/2015    Procedure: NODE DISSECTION;  Surgeon: [name], MD;  Location: [place];  Service: Thoracic;  Laterality: Right;   Current Outpatient Prescriptions on File Prior to Visit  Medication Sig Dispense Refill  . Albuterol Sulfate (PROAIR RESPICLICK) 409 (90 BASE) MCG/ACT AEPB Inhale 2 puffs into the lungs every 4 (  four) hours as needed. (Patient not taking: Reported on 06/24/2015) 1 each 11  . budesonide-formoterol (SYMBICORT) 80-4.5 MCG/ACT inhaler Inhale 2 puffs into the lungs 2 (two) times daily. 1 Inhaler 5  . clonazePAM (KLONOPIN) 1 MG tablet Take 1/2 tablet as needed twice a day for anxiety and 1-2 tablets at night as needed for sleep 60 tablet 0  . dexlansoprazole (DEXILANT) 60 MG capsule Take 1 capsule (60 mg total) by mouth daily. 30 capsule 12  . levocetirizine (XYZAL) 5 MG tablet Take 1 tablet (5 mg total) by mouth every evening. 30 tablet 11  . metoprolol tartrate (LOPRESSOR) 25 MG tablet Take 0.5 tablets (12.5 mg total) by mouth 2 (two) times daily. 30 tablet 1  . montelukast (SINGULAIR) 10 MG tablet Take 1 tablet (10 mg total) by mouth at bedtime. 30 tablet 3  . pravastatin (PRAVACHOL) 40 MG tablet Take  1 tablet (40 mg total) by mouth at bedtime. 90 tablet 0   No current facility-administered medications on file prior to visit.   Allergies  Allergen Reactions  . Aspirin Nausea And Vomiting  . Morphine And Related Itching   Family History  Problem Relation Age of Onset  . Cancer Mother     colon  . Heart disease Father   . Diabetes Sister   . Hypertension Sister   . Hypertension Brother   . Heart disease Maternal Grandmother     heart attack   Social History   Social History  . Marital Status: Married    Spouse Name: N/A  . Number of Children: N/A  . Years of Education: N/A   Occupational History  . truck driver-disability    Social History Main Topics  . Smoking status: Former Smoker -- 1.00 packs/day for 35 years    Types: Cigarettes    Quit date: 09/24/2011  . Smokeless tobacco: Never Used  . Alcohol Use: 0.0 oz/week    0 Standard drinks or equivalent per week     Comment: weekly  . Drug Use: No  . Sexual Activity: Not Asked   Other Topics Concern  . None   Social History Narrative    Review of Systems  Constitutional: Positive for activity change and fatigue. Negative for fever, chills, appetite change and unexpected weight change.  HENT: Positive for congestion, postnasal drip, rhinorrhea and voice change. Negative for facial swelling, sinus pressure, sneezing, sore throat and trouble swallowing.   Eyes: Negative for visual disturbance.  Respiratory: Negative for cough, chest tightness, shortness of breath and wheezing.   Cardiovascular: Negative for chest pain, palpitations and leg swelling.  Gastrointestinal: Positive for abdominal pain and constipation. Negative for nausea, vomiting, diarrhea and abdominal distention.  Musculoskeletal: Positive for myalgias and arthralgias. Negative for back pain, joint swelling and gait problem.  Skin: Positive for wound. Negative for color change and rash.  Neurological: Negative for dizziness, syncope, facial  asymmetry, weakness, light-headedness and headaches.  Hematological: Negative for adenopathy.  Psychiatric/Behavioral: Positive for dysphoric mood. Negative for behavioral problems, confusion and agitation. The patient is nervous/anxious.        Objective:  BP 143/85 mmHg  Pulse 78  Temp(Src) 98.6 F (37 C)  Resp 16  Ht '5\' 9"'$  (1.753 m)  Wt 170 lb (77.111 kg)  BMI 25.09 kg/m2  Physical Exam  Constitutional: He is oriented to person, place, and time. He appears well-developed and well-nourished. No distress.  HENT:  Head: Normocephalic and atraumatic.  Right Ear: Tympanic membrane, external ear and ear canal normal.  Left Ear: Tympanic membrane, external ear and ear canal normal.  Nose: Nose normal.  Mouth/Throat: Oropharynx is clear and moist and mucous membranes are normal. No oropharyngeal exudate.  Eyes: Conjunctivae are normal. No scleral icterus.  Neck: Normal range of motion. Neck supple. No thyromegaly present.  Cardiovascular: Normal rate, regular rhythm, normal heart sounds and intact distal pulses.   Pulmonary/Chest: Effort normal and breath sounds normal. No respiratory distress.  Musculoskeletal: He exhibits no edema.  Lymphadenopathy:    He has no cervical adenopathy.  Neurological: He is alert and oriented to person, place, and time.  Skin: Skin is warm and dry. He is not diaphoretic. No erythema.  Psychiatric: His speech is normal and behavior is normal. Judgment and thought content normal. Cognition and memory are normal. He exhibits a depressed mood.   Recheck BP 118/70     Assessment & Plan:   1. Non-small cell carcinoma of lung, right ([place])   2. Status post lobectomy of lung - starting pulm rehab today. Is supposed to have an o/n pulse ox study through [place] but hasn't been set up yet.  Try hydrocodone rather than tramadol for post-op pain  3. COPD GOLD II    4. Chronic sinusitis, unspecified location - HAs improved off of nasacort  5. Chronic  rhinitis   6. Essential hypertension - try to start monitoring outside office  7. Hyperlipidemia   8. Constipation, unspecified constipation type - increase miralax and add in senna  9. Adjustment disorder with mixed anxiety and depressed mood - we tried klonopin last mo after he received the diagnosis of lung cancer and pt has had benefit from this - would like to cont prn as he is still having trouble adjusting, starting pulm rehab etc. Ok to refill klonopin prn  10. Other fatigue     Meds ordered this encounter  Medications  . HYDROcodone-acetaminophen (NORCO/VICODIN) 5-325 MG tablet    Sig: Take 1-2 tablets by mouth every 6 (six) hours as needed for moderate pain.    Dispense:  75 tablet    Refill:  0  . senna (SENOKOT) 8.6 MG TABS tablet    Sig: Take 2 tablets (17.2 mg total) by mouth at bedtime.    Dispense:  120 each    Refill:  0   Over 40 min spent in face-to-face evaluation of and consultation with patient and coordination of care.  Over 50% of this time was spent counseling this patient.  [name], MD MPH  F/u OV in 3 mos for review of chronic medical problems

## 2015-06-25 ENCOUNTER — Encounter: Payer: Self-pay | Admitting: Internal Medicine

## 2015-06-28 ENCOUNTER — Telehealth (HOSPITAL_COMMUNITY): Payer: Self-pay

## 2015-06-28 ENCOUNTER — Telehealth: Payer: Self-pay

## 2015-06-28 NOTE — Telephone Encounter (Signed)
Pt is needing to talk with dr Brigitte Pulse about how he is feeling today -he is showing signs of sinus issues and is recovering from major surgery  Best  Number [phone]

## 2015-06-28 NOTE — Telephone Encounter (Signed)
Patient interested in Pulmonary Rehab program. Will call and check coverage and eligibility.

## 2015-06-29 NOTE — Telephone Encounter (Signed)
Spoke with pt, he states he was advised to stop the nasacort and he was taking the levocertrizine. His voice is raspy and his nose is stopped up. He wants to know if he can get something else called in. He has a headache and thinks it is from the two antihistamines taken together. Please advise.

## 2015-07-02 ENCOUNTER — Telehealth (HOSPITAL_COMMUNITY): Payer: Self-pay

## 2015-07-02 NOTE — Telephone Encounter (Signed)
Checked patient's insurance. Patient is 90% covered. [place] with financial responsibility. Starting Cancer Rehab 07/13/15. Wants to do Pulmonary Rehab after. Will follow up.

## 2015-07-05 DIAGNOSIS — I10 Essential (primary) hypertension: Secondary | ICD-10-CM | POA: Insufficient documentation

## 2015-07-05 DIAGNOSIS — Z902 Acquired absence of lung [part of]: Secondary | ICD-10-CM | POA: Insufficient documentation

## 2015-07-06 LAB — ACID FAST CULTURE WITH REFLEXED SENSITIVITIES (MYCOBACTERIA): Acid Fast Culture: NEGATIVE

## 2015-07-07 ENCOUNTER — Telehealth: Payer: Self-pay

## 2015-07-07 NOTE — Telephone Encounter (Signed)
Pharmacy is calling to follow up on request for dexilant. Stated that it's been faxed twice.

## 2015-07-08 MED ORDER — DEXLANSOPRAZOLE 60 MG PO CPDR: 60.0000 mg | DELAYED_RELEASE_CAPSULE | Freq: Every day | ORAL | Status: DC

## 2015-07-08 NOTE — Telephone Encounter (Signed)
Rx sent 

## 2015-07-09 ENCOUNTER — Other Ambulatory Visit: Payer: Self-pay | Admitting: *Deleted

## 2015-07-09 DIAGNOSIS — J449 Chronic obstructive pulmonary disease, unspecified: Secondary | ICD-10-CM

## 2015-07-09 DIAGNOSIS — Z902 Acquired absence of lung [part of]: Secondary | ICD-10-CM

## 2015-07-13 ENCOUNTER — Ambulatory Visit: Payer: Managed Care, Other (non HMO) | Attending: Internal Medicine | Admitting: Physical Therapy

## 2015-07-13 DIAGNOSIS — R262 Difficulty in walking, not elsewhere classified: Secondary | ICD-10-CM | POA: Insufficient documentation

## 2015-07-13 DIAGNOSIS — Z483 Aftercare following surgery for neoplasm: Secondary | ICD-10-CM | POA: Insufficient documentation

## 2015-07-13 NOTE — Therapy (Addendum)
[facility] [address] [place], [place], [address] Phone: [phone]   Fax:  [phone]  Physical Therapy Evaluation  Patient Details  Name: Elijah Marshall MRN: [id number] Date of Birth: 12-27-1950 Referring Provider: Dr. Si Gaul  Encounter Date: 07/13/2015      PT End of Session - 07/13/15 1810    Visit Number 1   Number of Visits 9   Date for PT Re-Evaluation 08/13/15   PT Start Time 1605   PT Stop Time 1700   PT Time Calculation (min) 55 min   Activity Tolerance Patient tolerated treatment well   Behavior During Therapy [facility] for tasks assessed/performed      Past Medical History  Diagnosis Date  . Allergy   . Ulcer   . COPD (chronic obstructive pulmonary disease) ([facility])     per patient's health survey - he put a ?  . GERD (gastroesophageal reflux disease)   . Arthritis   . Shortness of breath   . Hypertension   . Depression   . Cancer [facility])     unsure at this time  . Cancer of lower lobe of right lung ([facility])   . Non-small cell carcinoma of lung, right ([facility]) 06/24/2015    Past Surgical History  Procedure Laterality Date  . Spine surgery  2012  . Colonoscopy  14    polyps rem  . Joint replacement Right 2010  . Anterior fusion lumbar spine  07/25/2012  . Anterior lat lumbar fusion Left 07/25/2012    Procedure: ANTERIOR LATERAL LUMBAR FUSION 1 LEVEL;  Surgeon: Emilee Hero, MD;  [facility];  Service: Orthopedics;  Laterality: Left;  Left sided lumbar 3-4 lateral interbody fusion with allograft and instrumentation  . Video assisted thoracoscopy (vats)/wedge resection Right 05/24/2015    Procedure: RIGHT VIDEO ASSISTED THORACOSCOPY (VATS)/ RIGHTR LOWER LOBE WEDGE RESECTION;  Surgeon: Loreli Slot, MD;  Location: [facility] OR;  Service: Thoracic;  Laterality: Right;  . Segmentecomy Right 05/24/2015    Procedure: RIGHT LOWER LOBE SUPERIOR SEGMENTECTOMY;  Surgeon: Loreli Slot, MD;   Location: [facility] OR;  Service: Thoracic;  Laterality: Right;  . Node dissection Right 05/24/2015    Procedure: NODE DISSECTION;  Surgeon: Loreli Slot, MD;  Location: [facility] OR;  Service: Thoracic;  Laterality: Right;    There were no vitals filed for this visit.       Subjective Assessment - 07/13/15 1616    Subjective Dr. Arbutus Ped recommended me for physical therapy.   Pertinent History past medical history significant for COPD, chronic rhinitis, GERD, dyslipidemia, hypertension and chronic sinusitis. The patient was followed by Dr. Sherene Sires for his COPD and chronic rhinitis. Chest x-ray was performed on 03/29/2015. It showed 1.3 cm nodular opacity projecting in the right midlung just above the minor fissure. A PET scan was performed on 05/03/2015 and it showed intense radiotracer uptake associated with the subpleural nodule in the superior segment of the right lower lobe. The findings are worrisome for primary bronchogenic carcinoma. There was no evidence for hypermetabolic hilar or mediastinal lymph node metastasis or distant metastatic disease.The patient was referred to Dr. Dorris Fetch and on 05/24/2015 he underwent right VATS with right lower lobe wedge resection and biopsy of the right middle lobe in addition to mediastinal lymph node dissection.  h/o two back surgeries including fusion; right total knee replacement; says he needs left hip replacement   Patient Stated Goals be healed, get well, get cancer free   Currently in  Pain? Yes   Pain Score 3    Pain Location Abdomen   Pain Orientation Right   Pain Descriptors / Indicators Sore   Aggravating Factors  after walking or sitting up for a while   Pain Relieving Factors lie down            [facility] PT Assessment - 07/13/15 0001    Assessment   Medical Diagnosis s/p VATS wedge resection of right lower lobe for squamous cell carcinoma   Referring Provider Dr. Curt Bears   Onset Date/Surgical Date 05/24/15   Precautions    Precautions Other (comment)   Precaution Comments cancer precautions  still has O2 prn   Restrictions   Weight Bearing Restrictions No   Balance Screen   Has the patient fallen in the past 6 months No   Has the patient had a decrease in activity level because of a fear of falling?  No   Is the patient reluctant to leave their home because of a fear of falling?  No   Home Environment   Living Environment Private residence   Living Arrangements --  someone   Type of [place] to enter   Entrance Stairs-Number of Steps 6   Home Layout One level   [place] Other (comment)  O2   Prior Function   Level of Independence Independent   Vocation On disability  2 back surgeries and a knee replacement   Leisure walks most days, at least a mile if he can; used to workout with weights 4-5 years ago before a back surgery   Cognition   Overall Cognitive Status Within Functional Limits for tasks assessed   Observation/Other Assessments   Skin Integrity 3 scope spots healing well along right flank   Functional Tests   Functional tests Sit to Stand   Sit to Stand   Comments 13 times in 30 seconds  moderate dyspnea after this; SpO2 91% after this   Posture/Postural Control   Posture/Postural Control No significant limitations   ROM / Strength   AROM / PROM / Strength AROM;Strength   AROM   Overall AROM Comments in standing, flexion minimally limited; extension 50% limited; sidebend slightly limited and appears stiff, and same with rotation bilat.   Ambulation/Gait   Ambulation/Gait Yes   Ambulation/Gait Assistance 7: Independent   6 Minute Walk- Baseline   6 Minute Walk- Baseline yes   HR (bpm) 91   02 Sat (%RA) 95 %   Perceived Rate of Exertion (Borg) --   6 Minute walk- Post Test   6 Minute Walk Post Test yes   HR (bpm) 104   02 Sat (%RA) 85 %  up to 90 within approx. 20 seconds   Modified Borg Scale for Dyspnea 2- Mild shortness of breath   Perceived  Rate of Exertion (Borg) 11- Fairly light   6 minute walk test results    Aerobic Endurance Distance Walked [facility] Goals - 07/13/15 1819    CC Long Term Goal  #1   Title Patient will walk 1120 feet or more in 6 minute walk test without SpO2 dropping below 90%   Time 4   Period Weeks   Status New   CC Long Term Goal  #2  Title Pt. will be independent with home exercise program with focus on pulmonary function improvement.   Time 4   Period Weeks   Status New            Plan - 07/13/15 1811    Clinical Impression Statement This is a very pleasant gentleman s/p VATS right lower lobe wedge resection for lung cancer; he had clear margins and no lymph node involvement so does not expect further treatment at this time.  He currently experiences shortness of breath with mild exertion.  His SpO2 dropped to 85 after 6 minute walk test, though it came up to 90 within about 20 seconds.  His dyspnea was moderate earlier in that walk and mild toward the end of it.  He also had moderate dyspnea with 30-second sit to stand.  His LE strength is overall quite normal.  Trunk ROM is mildly limited, but patient has a h/o two back surgeries (as well as total knee replacement).  He seems like a good candidate for the pulmonary rehab program, but could also benefit from physical therapy.  He does have some residual pain from the VATS procedure.  He appears highly motivated.   Rehab Potential Good   PT Frequency 2x / week   PT Duration 4 weeks   PT Treatment/Interventions ADLs/Self Care Home Management;Gait training;Stair training;Therapeutic exercise;Patient/family education   PT Next Visit Plan Therapist will check with pulmonary rehab about the possibility of his doing that program; if there is a delay in his starting there or if that program is not feasible for some reason, call to schedule patient and begin exercise program with  focus on endurance and breathing.   Consulted and Agree with Plan of Care Patient      Patient will benefit from skilled therapeutic intervention in order to improve the following deficits and impairments:  Cardiopulmonary status limiting activity, Decreased activity tolerance, Decreased endurance  Visit Diagnosis: Aftercare following surgery for neoplasm - Plan: PT plan of care cert/re-cert  Difficulty in walking, not elsewhere classified - Plan: PT plan of care cert/re-cert     Problem List Patient Active Problem List   Diagnosis Date Noted  . Status post lobectomy of lung 07/05/2015  . Hypertension 07/05/2015  . Non-small cell carcinoma of lung, right ([place]) 06/24/2015  . Hyperlipidemia 04/20/2015  . Chronic infection of sinus 04/20/2015  . GERD (gastroesophageal reflux disease) 06/08/2014  . Chronic rhinitis 10/30/2012  . COPD GOLD II  09/26/2011    Marshall,Elijah 07/13/2015, 6:22 PM  [place], [place], [address] Phone: [phone]   Fax:  [phone]  Name: Elijah Marshall MRN: [id number] Date of Birth: 06/26/1950   Serafina Royals, PT 07/13/2015 6:22 PM  .PHYSICAL THERAPY DISCHARGE SUMMARY  Visits from Start of Care: 1  Current functional level related to goals / functional outcomes: Patient did not do therapy with [place]; from his electronic record, it looks like he did do pulmonary rehab instead.   Remaining deficits: Unknown by PT.   Education / Equipment: Lung clinic education on walking, breathing exercise, posture. Plan: Patient agrees to discharge.  Patient goals were met. Patient is being discharged due to meeting the stated rehab goals.  ?????   Serafina Royals, PT 01/12/16 9:32 AM

## 2015-07-16 ENCOUNTER — Telehealth (HOSPITAL_COMMUNITY): Payer: Self-pay

## 2015-07-20 ENCOUNTER — Other Ambulatory Visit: Payer: Self-pay | Admitting: Family Medicine

## 2015-07-20 ENCOUNTER — Other Ambulatory Visit: Payer: Self-pay | Admitting: Physician Assistant

## 2015-08-02 ENCOUNTER — Encounter (HOSPITAL_COMMUNITY)
Admission: RE | Admit: 2015-08-02 | Discharge: 2015-08-02 | Disposition: A | Discharge status: Home / Self Care | Payer: Medicare Other | Source: Ambulatory Visit | Attending: Thoracic Surgery (Cardiothoracic Vascular Surgery) | Admitting: Thoracic Surgery (Cardiothoracic Vascular Surgery)

## 2015-08-02 ENCOUNTER — Encounter (HOSPITAL_COMMUNITY): Payer: Self-pay

## 2015-08-02 VITALS — BP 146/89 | HR 75 | Ht 68.0 in | Wt 173.5 lb

## 2015-08-02 DIAGNOSIS — Z029 Encounter for administrative examinations, unspecified: Secondary | ICD-10-CM | POA: Insufficient documentation

## 2015-08-02 DIAGNOSIS — C3431 Malignant neoplasm of lower lobe, right bronchus or lung: Secondary | ICD-10-CM

## 2015-08-02 NOTE — Progress Notes (Signed)
Elijah Marshall 65 y.o. male Pulmonary Rehab Orientation Note Patient arrived today in Cardiac and Pulmonary Rehab for orientation to Pulmonary Rehab. He was transported from [employer] via wheel chair. He does not carry portable oxygen. Per pt, he does not use oxygen now.  He had a sleep study done and is awaiting the results.  When he was in the hospital after his VATS surgery he was on oxygen. Color good, skin warm and dry. Patient is oriented to time and place. Patient's medical history, psychosocial health, and medications reviewed. Psychosocial assessment reveals pt lives with their spouse. Pt is currently disabled due to 2 back surgeries.  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 Pt hobbies include bowling, working out and and walking with his wife. Pt reports his stress level is moderate. Areas of stress/anxiety include his Health. He feels he is fortunate that he did not require radiation or chemotherapy after his lung resection.   Pt does not exhibit signs of depression. PHQ2/9 score 0/0. Pt shows good  coping skills with positive outlook .  Offered emotional support and reassurance. Will continue to monitor and evaluate progress toward psychosocial goal(s) of increase in strength and stamina to the level he was before his lung surgery. Physical assessment reveals heart rate is normal, breath sounds clear to auscultation, no wheezes, rales, or rhonchi. Grip strength equal, strong. Distal pulses 2+ bilateral posterior tibial pulses present. Patient reports he does take medications as prescribed. Patient states he follows a Regular diet. The patient reports no specific efforts to gain or lose weight.. Patient's weight will be monitored closely. Demonstration and practice of PLB using pulse oximeter. Patient able to return demonstration satisfactorily. Safety and hand hygiene in the exercise area reviewed with patient. Patient voices understanding of the information  reviewed. Department expectations discussed with patient and achievable goals were set. The patient shows enthusiasm about attending the program and we look forward to working with this nice gentleman. The patient is scheduled for a 6 min walk test on Thursday, August 05, 2015 @ 3:30 pm and to begin exercise on Thursday, August 12, 2015 in the 1:30 pm class. [phone].

## 2015-08-03 ENCOUNTER — Encounter (HOSPITAL_COMMUNITY): Payer: Self-pay | Admitting: *Deleted

## 2015-08-05 ENCOUNTER — Encounter (HOSPITAL_COMMUNITY)
Admission: RE | Admit: 2015-08-05 | Discharge: 2015-08-05 | Disposition: A | Discharge status: Home / Self Care | Payer: Medicare Other | Source: Ambulatory Visit | Attending: Thoracic Surgery (Cardiothoracic Vascular Surgery) | Admitting: Thoracic Surgery (Cardiothoracic Vascular Surgery)

## 2015-08-05 DIAGNOSIS — Z029 Encounter for administrative examinations, unspecified: Secondary | ICD-10-CM | POA: Diagnosis not present

## 2015-08-05 DIAGNOSIS — C3431 Malignant neoplasm of lower lobe, right bronchus or lung: Secondary | ICD-10-CM

## 2015-08-05 NOTE — Progress Notes (Signed)
Pulmonary Individual Treatment Plan  Patient Details  Name: Elijah Marshall MRN: [id number] Date of Birth: July 13, 1950 Referring Provider:        Pulmonary Rehab Walk Test from 08/05/2015 in [place]   Referring Provider  Dr. Roxan Hockey      Initial Encounter Date:       Pulmonary Rehab Walk Test from 08/05/2015 in [place]   Date  08/05/15   Referring Provider  Dr. Roxan Hockey      Visit Diagnosis: Malignant neoplasm of lower lobe of right lung [facility])  Patient's Home Medications on Admission:   Current outpatient prescriptions:  .  Albuterol Sulfate (PROAIR RESPICLICK) 443 (90 BASE) MCG/ACT AEPB, Inhale 2 puffs into the lungs every 4 (four) hours as needed., Disp: 1 each, Rfl: 11 .  budesonide-formoterol (SYMBICORT) 80-4.5 MCG/ACT inhaler, Inhale 2 puffs into the lungs 2 (two) times daily., Disp: 1 Inhaler, Rfl: 5 .  clonazePAM (KLONOPIN) 1 MG tablet, Take 1/2 tablet as needed twice a day for anxiety and 1-2 tablets at night as needed for sleep, Disp: 60 tablet, Rfl: 0 .  dexlansoprazole (DEXILANT) 60 MG capsule, Take 1 capsule (60 mg total) by mouth daily., Disp: 30 capsule, Rfl: 5 .  HYDROcodone-acetaminophen (NORCO/VICODIN) 5-325 MG tablet, Take 1-2 tablets by mouth every 6 (six) hours as needed for moderate pain., Disp: 75 tablet, Rfl: 0 .  levocetirizine (XYZAL) 5 MG tablet, Take 1 tablet (5 mg total) by mouth every evening., Disp: 30 tablet, Rfl: 11 .  metoprolol tartrate (LOPRESSOR) 25 MG tablet, TAKE 1/2 A TABLET BY MOUTH TWICE A DAY, Disp: 30 tablet, Rfl: 1 .  montelukast (SINGULAIR) 10 MG tablet, Take 1 tablet (10 mg total) by mouth at bedtime. (Patient not taking: Reported on 07/13/2015), Disp: 30 tablet, Rfl: 3 .  polyethylene glycol (MIRALAX) packet, Take 17 g by mouth daily., Disp: , Rfl:  .  pravastatin (PRAVACHOL) 40 MG tablet, TAKE 1 TABLET BY MOUTH AT BEDTIME, Disp: 90 tablet, Rfl: 0 .  ranitidine  (ZANTAC) 150 MG capsule, Take 150 mg by mouth every evening. Take 2 tablets HS, Disp: , Rfl:  .  senna (SENOKOT) 8.6 MG TABS tablet, Take 2 tablets (17.2 mg total) by mouth at bedtime., Disp: 120 each, Rfl: 0 .  traMADol (ULTRAM) 50 MG tablet, Take by mouth every 6 (six) hours as needed for moderate pain., Disp: , Rfl:  .  triamcinolone (NASACORT ALLERGY 24HR) 55 MCG/ACT AERO nasal inhaler, Place 2 sprays into the nose daily., Disp: , Rfl:   Past Medical History: Past Medical History  Diagnosis Date  . Allergy   . Ulcer   . COPD (chronic obstructive pulmonary disease) ([place])     per patient's health survey - he put a ?  . GERD (gastroesophageal reflux disease)   . Arthritis   . Shortness of breath   . Hypertension   . Depression   . Cancer [facility])     unsure at this time  . Cancer of lower lobe of right lung ([place])   . Non-small cell carcinoma of lung, right ([place]) 06/24/2015    Tobacco Use: History  Smoking status  . Former Smoker -- 1.00 packs/day for 35 years  . Types: Cigarettes  . Quit date: 09/24/2011  Smokeless tobacco  . Never Used    Labs: Recent Review Flowsheet Data    Labs for ITP Cardiac and Pulmonary Rehab Latest Ref Rng 04/15/2015 05/20/2015 05/25/2015   Cholestrol 125 -  200 mg/dL 260(H) - -   LDLCALC <130 mg/dL 171(H) - -   HDL >=40 mg/dL 67 - -   Trlycerides <150 mg/dL 108 - -   PHART 7.350 - 7.450 - 7.414 7.393   PCO2ART 35.0 - 45.0 mmHg - 39.8 38.5   HCO3 20.0 - 24.0 mEq/L - 25.0(H) 23.0   TCO2 0 - 100 mmol/L - 26.2 24.1   ACIDBASEDEF 0.0 - 2.0 mmol/L - - 1.3   O2SAT - - 94.1 94.7      Capillary Blood Glucose: Lab Results  Component Value Date   GLUCAP 76 05/25/2015   GLUCAP 115* 05/25/2015   GLUCAP 83 05/25/2015   GLUCAP 96 05/24/2015   GLUCAP 98 05/24/2015     ADL UCSD:     Pulmonary Assessment Scores      08/03/15 1504       ADL UCSD   ADL Phase Entry     SOB Score total 39        Pulmonary Function Assessment:     Pulmonary  Function Assessment - 08/02/15 1025    Breath   Bilateral Breath Sounds Clear   Shortness of Breath No      Exercise Target Goals: Date: 08/05/15  Exercise Program Goal: Individual exercise prescription set with THRR, safety & activity barriers. Participant demonstrates ability to understand and report RPE using BORG scale, to self-measure pulse accurately, and to acknowledge the importance of the exercise prescription.  Exercise Prescription Goal: Starting with aerobic activity 30 plus minutes a day, 3 days per week for initial exercise prescription. Provide home exercise prescription and guidelines that participant acknowledges understanding prior to discharge.  Activity Barriers & Risk Stratification:     Activity Barriers & Cardiac Risk Stratification - 08/02/15 1023    Activity Barriers & Cardiac Risk Stratification   Activity Barriers Arthritis;Back Problems;Deconditioning;Muscular Weakness;Shortness of Breath;Incisional Pain;Right Knee Replacement      6 Minute Walk:     6 Minute Walk      08/05/15 1646       6 Minute Walk   Phase Initial     Distance 1410 feet     Walk Time 6 minutes     # of Rest Breaks 0     MPH 2.67     METS 2.99     RPE 13     Perceived Dyspnea  1     Symptoms No     Resting HR 121 bpm     Resting BP 150/88 mmHg     Max Ex. HR 121 bpm     Max Ex. BP 178/84 mmHg     2 Minute Post BP 148/84 mmHg     Interval HR   Baseline HR 89     1 Minute HR 97     2 Minute HR 94     3 Minute HR 89     4 Minute HR 88     5 Minute HR 87     6 Minute HR 87     2 Minute Post HR 96     Interval Heart Rate? Yes     Interval Oxygen   Baseline Oxygen Saturation % 96 %     Baseline Liters of Oxygen 0 L     1 Minute Oxygen Saturation % 97 %     1 Minute Liters of Oxygen 0 L     2 Minute Oxygen Saturation % 94 %     2 Minute Liters of  Oxygen 0 L     3 Minute Oxygen Saturation % 89 %     3 Minute Liters of Oxygen 0 L     4 Minute Oxygen Saturation %  88 %     4 Minute Liters of Oxygen 0 L     5 Minute Oxygen Saturation % 87 %     5 Minute Liters of Oxygen 0 L     6 Minute Oxygen Saturation % 87 %     6 Minute Liters of Oxygen 0 L     2 Minute Post Oxygen Saturation % 96 %     2 Minute Post Liters of Oxygen 0 L        Initial Exercise Prescription:     Initial Exercise Prescription - 08/05/15 1600    Date of Initial Exercise RX and Referring Provider   Date 08/05/15   Referring Provider Dr. Roxan Hockey   Treadmill   MPH 1.7   Grade 0   Minutes 17   NuStep   Level 2   Minutes 17   METs 1.5   Elliptical   Level 2   Speed 1   Minutes 17   Prescription Details   Frequency (times per week) 2   Duration Progress to 45 minutes of aerobic exercise without signs/symptoms of physical distress   Intensity   THRR 40-80% of Max Heartrate 62-125   Ratings of Perceived Exertion 11-13   Perceived Dyspnea 0-4   Progression   Progression Continue progressive overload as per policy without signs/symptoms or physical distress.   Resistance Training   Training Prescription Yes   Weight orange bands   Reps 10-12      Perform Capillary Blood Glucose checks as needed.  Exercise Prescription Changes:   Exercise Comments:   Discharge Exercise Prescription (Final Exercise Prescription Changes):    Nutrition:  Target Goals: Understanding of nutrition guidelines, daily intake of sodium '1500mg'$ , cholesterol '200mg'$ , calories 30% from fat and 7% or less from saturated fats, daily to have 5 or more servings of fruits and vegetables.  Biometrics:     Pre Biometrics - 08/02/15 1032    Pre Biometrics   Grip Strength 50 kg       Nutrition Therapy Plan and Nutrition Goals:   Nutrition Discharge: Rate Your Plate Scores:   Psychosocial: Target Goals: Acknowledge presence or absence of depression, maximize coping skills, provide positive support system. Participant is able to verbalize types and ability to use techniques and  skills needed for reducing stress and depression.  Initial Review & Psychosocial Screening:     Initial Psych Review & Screening - 08/02/15 [place]? Yes   Barriers   Psychosocial barriers to participate in program There are no identifiable barriers or psychosocial needs.   Screening Interventions   Interventions Encouraged to exercise      Quality of Life Scores:     Quality of Life - 08/03/15 1504    Quality of Life Scores   Health/Function Pre 16.23 %   Socioeconomic Pre 17.44 %   Psych/Spiritual Pre 27.43 %   Family Pre 22.5 %   GLOBAL Pre 19.5 %      PHQ-9:     Recent Review Flowsheet Data    Depression screen [facility] 2/9 08/02/2015 06/24/2015 04/15/2015 01/15/2015 10/20/2014   Decreased Interest 0 0 0 0 0   Down, Depressed, Hopeless 0 1 2 0 0   PHQ - 2 Score  0 1 2 0 0   Altered sleeping - - 2 - -   Tired, decreased energy - - 3 - -   Change in appetite - - 0 - -   Feeling bad or failure about yourself  - - 3 - -   Trouble concentrating - - 0 - -   Moving slowly or fidgety/restless - - 1 - -   Suicidal thoughts - - 1 - -   PHQ-9 Score - - 12 - -      Psychosocial Evaluation and Intervention:     Psychosocial Evaluation - 08/02/15 1036    Psychosocial Evaluation & Interventions   Interventions Encouraged to exercise with the program and follow exercise prescription   Continued Psychosocial Services Needed No      Psychosocial Re-Evaluation:  Education: Education Goals: Education classes will be provided on a weekly basis, covering required topics. Participant will state understanding/return demonstration of topics presented.  Learning Barriers/Preferences:     Learning Barriers/Preferences - 08/02/15 1024    Learning Barriers/Preferences   Learning Barriers None   Learning Preferences Skilled Demonstration;Group Instruction      Education Topics: Risk Factor Reduction:  -Group instruction that is supported by a  PowerPoint presentation. Instructor discusses the definition of a risk factor, different risk factors for pulmonary disease, and how the heart and lungs work together.     Nutrition for Pulmonary Patient:  -Group instruction provided by PowerPoint slides, verbal discussion, and written materials to support subject matter. The instructor gives an explanation and review of healthy diet recommendations, which includes a discussion on weight management, recommendations for fruit and vegetable consumption, as well as protein, fluid, caffeine, fiber, sodium, sugar, and alcohol. Tips for eating when patients are short of breath are discussed.   Pursed Lip Breathing:  -Group instruction that is supported by demonstration and informational handouts. Instructor discusses the benefits of pursed lip and diaphragmatic breathing and detailed demonstration on how to preform both.     Oxygen Safety:  -Group instruction provided by PowerPoint, verbal discussion, and written material to support subject matter. There is an overview of "What is Oxygen" and "Why do we need it".  Instructor also reviews how to create a safe environment for oxygen use, the importance of using oxygen as prescribed, and the risks of noncompliance. There is a brief discussion on traveling with oxygen and resources the patient may utilize.   Oxygen Equipment:  -Group instruction provided by [facility] Staff utilizing handouts, written materials, and equipment demonstrations.   Signs and Symptoms:  -Group instruction provided by written material and verbal discussion to support subject matter. Warning signs and symptoms of infection, stroke, and heart attack are reviewed and when to call the physician/911 reinforced. Tips for preventing the spread of infection discussed.   Advanced Directives:  -Group instruction provided by verbal instruction and written material to support subject matter. Instructor reviews Advanced Directive laws  and proper instruction for filling out document.   Pulmonary Video:  -Group video education that reviews the importance of medication and oxygen compliance, exercise, good nutrition, pulmonary hygiene, and pursed lip and diaphragmatic breathing for the pulmonary patient.   Exercise for the Pulmonary Patient:  -Group instruction that is supported by a PowerPoint presentation. Instructor discusses benefits of exercise, core components of exercise, frequency, duration, and intensity of an exercise routine, importance of utilizing pulse oximetry during exercise, safety while exercising, and options of places to exercise outside of rehab.     Pulmonary Medications:  -  Verbally interactive group education provided by instructor with focus on inhaled medications and proper administration.   Anatomy and Physiology of the Respiratory System and Intimacy:  -Group instruction provided by PowerPoint, verbal discussion, and written material to support subject matter. Instructor reviews respiratory cycle and anatomical components of the respiratory system and their functions. Instructor also reviews differences in obstructive and restrictive respiratory diseases with examples of each. Intimacy, Sex, and Sexuality differences are reviewed with a discussion on how relationships can change when diagnosed with pulmonary disease. Common sexual concerns are reviewed.   Knowledge Questionnaire Score:     Knowledge Questionnaire Score - 08/03/15 1504    Knowledge Questionnaire Score   Pre Score 8/13      Core Components/Risk Factors/Patient Goals at Admission:     Personal Goals and Risk Factors at Admission - 08/02/15 1033    Core Components/Risk Factors/Patient Goals on Admission   Increase Strength and Stamina Yes   Intervention Provide advice, education, support and counseling about physical activity/exercise needs.;Develop an individualized exercise prescription for aerobic and resistive training  based on initial evaluation findings, risk stratification, comorbidities and participant's personal goals.   Expected Outcomes Achievement of increased cardiorespiratory fitness and enhanced flexibility, muscular endurance and strength shown through measurements of functional capacity and personal statement of participant.   Improve shortness of breath with ADL's Yes   Intervention Provide education, individualized exercise plan and daily activity instruction to help decrease symptoms of SOB with activities of daily living.   Expected Outcomes Short Term: Achieves a reduction of symptoms when performing activities of daily living.      Core Components/Risk Factors/Patient Goals Review:      Goals and Risk Factor Review      08/02/15 1034           Core Components/Risk Factors/Patient Goals Review   Personal Goals Review Increase Strength and Stamina;Improve shortness of breath with ADL's       Review Increase strength and stamina by increasing exercise       Expected Outcomes Improved strength and stamina as before lung surgery          Core Components/Risk Factors/Patient Goals at Discharge (Final Review):      Goals and Risk Factor Review - 08/02/15 1034    Core Components/Risk Factors/Patient Goals Review   Personal Goals Review Increase Strength and Stamina;Improve shortness of breath with ADL's   Review Increase strength and stamina by increasing exercise   Expected Outcomes Improved strength and stamina as before lung surgery      ITP Comments:   Comments:

## 2015-08-09 ENCOUNTER — Ambulatory Visit (INDEPENDENT_AMBULATORY_CARE_PROVIDER_SITE_OTHER): Payer: Managed Care, Other (non HMO) | Admitting: Adult Health

## 2015-08-09 ENCOUNTER — Encounter: Payer: Self-pay | Admitting: Adult Health

## 2015-08-09 ENCOUNTER — Ambulatory Visit (INDEPENDENT_AMBULATORY_CARE_PROVIDER_SITE_OTHER)
Admission: RE | Admit: 2015-08-09 | Discharge: 2015-08-09 | Disposition: A | Discharge status: Home / Self Care | Payer: Managed Care, Other (non HMO) | Source: Ambulatory Visit | Attending: Adult Health | Admitting: Adult Health

## 2015-08-09 VITALS — BP 134/92 | HR 94 | Temp 98.0°F | Ht 69.0 in | Wt 175.0 lb

## 2015-08-09 DIAGNOSIS — C3491 Malignant neoplasm of unspecified part of right bronchus or lung: Secondary | ICD-10-CM

## 2015-08-09 DIAGNOSIS — J329 Chronic sinusitis, unspecified: Secondary | ICD-10-CM | POA: Diagnosis not present

## 2015-08-09 DIAGNOSIS — J449 Chronic obstructive pulmonary disease, unspecified: Secondary | ICD-10-CM

## 2015-08-09 MED ORDER — AMOXICILLIN-POT CLAVULANATE 875-125 MG PO TABS: 1.0000 | ORAL_TABLET | Freq: Two times a day (BID) | ORAL | Status: DC

## 2015-08-09 NOTE — Addendum Note (Signed)
Addended by: Osa Craver on: 08/09/2015 03:04 PM   Modules accepted: Orders

## 2015-08-09 NOTE — Progress Notes (Signed)
Subjective:    Patient ID: Elijah Marshall, male    DOB: 1950-03-20  MRN: [id number]   Brief patient profile:  65  yobm quit smoking 2013  and able work out at [employer] and did fine until hurt back 2011 then 2 back surgeries and knee surgery  and more noticeable doe  since then but also limited by back and referred to pulmonary clinic 09/05/2013 for ? Copd  With GOLD II criteria 10/29/13     History of Present Illness  09/05/2013 1st [facility] office visit/ [facility]  Chief Complaint  Patient presents with  . Pulmonary Consult    Referred per Dr. Norberto Sorenson.  Pt c/o SOB for the past 3 yrs. He states that he was dxed with COPD back in 2012 or 2013.  He states that that he sometimes has trouble with breathing when walking up stairs and lifting things.    rx spiriva first, alb, dulera not as effective  No problem at rest or supine Assoc nasal congestion much better p shot (?depomedrol) and bad again x sev years corresponds to worse doe  Ex = walking one mile slower pace than wife,trouble with hills, worse in heat  Bad hb even on dexilant  Min dry cough  rec Stop spiriva  Start anoro two puffs off one click each am Zantac 150 mg one at  Bedtime Prednisone 10 mg take  4 each am x 2 days,   2 each am x 2 days,  1 each am x 2 days and stop  GERD diet    10/15/2013 f/u ov/Wert re: copd GOLD II better on anoro, new c/o sinus congestion  Chief Complaint  Patient presents with  . Follow-up    Breathing is some better since the last visit. He c/o sinus congestion and HA at bedtime for the past 2 wks. He also c/o PND.   acute onset variably severe assoc with nasal congestion / mucus yellowish/ bilateral temp HA worse as day goes on and better in am/ occurred p upper incisor infection and already on clindamycin per dentist  Breathing better on anoro, no need for saba  rec Depomedrol 120 mg today  schedule sinus CT > neg Ok take advil cold and sinus for sinus /nasal congestion    10/29/2013  f/u ov/Wert re:  GOLD II, no need for saba on anoro daily  Chief Complaint  Patient presents with  . Follow-up    Breathing is some better- has not use rescue inhaler since the last visit. He states that he is coughing up minimal yellow sputum and still c/o sinus pressure.   cough worse x sev weeks with nasal congestion not using singulair consistently at all with neg sinus ct noted.  advil cold and sinus helps some. Not limited by breathing from desired activities   rec  singulair trial daily > x one month no better so d/c'd    01/28/2014 acute ov/ maint on anoro with aecopd assoc with fare of rhinitis sp trip to er for palpitations ? From over use of saba  Chief Complaint  Patient presents with  . Follow-up    pt c/o cough with yellow mucus, he has been taking mucinex. Patient reports cough worse at night. Patient denies chest tightness and wheeze. He has chest soreness   chest soreness gen anteriorly duing coughing fits, much worse since first week in Dec, indolent onset peristent daily some better with saba despite poor hfa  rec Prednisone 10 mg  take  4 each am x 2 days,   2 each am x 2 days,  1 each am x 2 days and stop  Augmentin 875 mg take one pill twice daily  X 10 days - take at breakfast and supper with large glass of water.  It would help reduce the usual side effects (diarrhea and yeast infections) if you ate cultured yogurt at lunch.  Stop anoro Symbicort 160 Take 2 puffs first thing in am and then another 2 puffs about 12 hours later.  Spiriva respimat 2 pffs each am    02/11/2014 f/u ov/Wert re: ran out of both symb/ spiriva  Chief Complaint  Patient presents with  . Follow-up    Cough is some better since the last visit. He c/o ear pain, sinus pressure and HA for the past 2 days. He states that although the cough is better, he still coughs up minimal yellow sputum.   rec Plan A = automatic = symbicort 160 Take 2 puffs first thing in am and then another 2 puffs about 12  hours later and dexilant before bfast and zantac at bedtime Plan B = backup Only use your albuterol (proventil) as a rescue medication GERD diet   Please schedule a follow up office visit in 2  weeks, sooner if needed with all medications in meds    02/25/2014 f/u ov/Wert re: GOLD II / symbicort 160 2bid / no need for rescue at all  Chief Complaint  Patient presents with  . Follow-up    Pt states that his cough is some better. He c/o hoarsness for the past wk. His breathing is unchanged.    Main issue is hoarsenss and throat tickle nasal stuffiness/ advil cold and sinus best, no purulent nasal secretions  rec Dexilant Take 30-60 min before first meal of the day and Zantac 150 mg at bedtime Symbicort 80 Take 2 puffs first thing in am and then another 2 puffs about 12 hours later.  Only use your albuterol (proventil) as a rescue medication  Heartburn gaviscon liquid as needed > if not better you may need to see a GI doctor For stuffy nose > stop ipatropium and use nasal saline > let dr Clelia Croft refer you to ENT doctor if needed    03/29/2015  f/u ov/Wert re:  COPD II/ on symbicort 80 2bid   maint rx  Chief Complaint  Patient presents with  . Follow-up    1 year rov copd- pt c/o worsening SOB laying down and with exertion.  Also c/o teeth pain, HA, sinus pressure, pnd, prod cough with white/yellow mucus.  denies CP, chest tightness.    worse sob x sev weeks assoc with nasal congestion not responding to nasal steroids. Doe = MMRC2 = can't walk a nl pace on a flat grade s sob  >>augmentin and pred taper.   08/09/2015 Acute OV :COPD GOLD II /NSCLC Stage 1A s/p wedge resection 05/2015  Pt presents for an acute office visit. Complains of 2 weeks of prod cough with white mucus, sinus pressure/congestion x 1 week. Denies any SOB/wheezing, chest congestion/tightness, fever, nausesa or vomiting. Has sinus congestion and pressure, teeth pain on/off.   Recent dx of NSCLC s/p wedge resection in April 2017.    Has planned follow up serial CT chest with Dr. Arbutus Ped coming up soon.     Current Medications, Allergies, Complete Past Medical History, Past Surgical History, Family History, and Social History were reviewed in [employer] record.  ROS  The following are not active complaints unless bolded sore throat, dysphagia, dental problems, itching, sneezing,  nasal congestion or excess/ purulent secretions, ear ache,   fever, chills, sweats, unintended wt loss, pleuritic or exertional cp, hemoptysis,  orthopnea pnd or leg swelling, presyncope, palpitations, heartburn, abdominal pain, anorexia, nausea, vomiting, diarrhea  or change in bowel or urinary habits, change in stools or urine, dysuria,hematuria,  rash, arthralgias, visual complaints, headache, numbness weakness or ataxia or problems with walking or coordination,  change in mood/affect or memory.             Objective:   Physical Exam  amb bm nad    Filed Vitals:   08/09/15 1417  BP: 134/92  Pulse: 94  Temp: 98 F (36.7 C)  TempSrc: Oral  Height: 5\' 9"  (1.753 m)  Weight: 175 lb (79.379 kg)  SpO2: 98%        HEENT mild turbinate edema. +max tenderness  Oropharynx no thrush or excess pnd or cobblestoning.  No JVD or cervical adenopathy. Mild accessory muscle hypertrophy. Trachea midline, nl thryroid. Chest was hyperinflated by percussion with diminished breath sounds and mild exp time with trace end exp. Hoover sign positive at end inspiration. Regular rate and rhythm without murmur gallop or rub or increase P2 or edema.  Abd: no hsm, nl excursion. Ext warm without cyanosis or clubbing.     CT chest 05/30/15  IMPRESSION: 1. Postsurgical change in the RIGHT lower lobe with some solid tissue and gas in the azygos esophageal recess medial to the surgical margin. No pneumothorax. 2. Gas along the RIGHT lateral chest wall consistent recent surgery. 3. No pulmonary embolism.   Elijah Charnley NP-C  [place]  [facility]  08/09/2015

## 2015-08-09 NOTE — Assessment & Plan Note (Signed)
Mild flare with sinusitis   Plan  Augmentin '875mg'$  Twice daily  For 10 days -take with food.  Mucinex DM Twice daily  As needed  Cough/congestion  Saline nasal rinses As needed   Chest xray today .  Please contact office for sooner follow up if symptoms do not improve or worsen or seek emergency care  Follow up Dr. Melvyn Novas  3 months and As needed

## 2015-08-09 NOTE — Patient Instructions (Addendum)
Augmentin '875mg'$  Twice daily  For 10 days -take with food.  Mucinex DM Twice daily  As needed  Cough/congestion  Saline nasal rinses As needed   Chest xray today .  Please contact office for sooner follow up if symptoms do not improve or worsen or seek emergency care  Follow up Dr. Melvyn Novas  3 months and As needed

## 2015-08-09 NOTE — Progress Notes (Signed)
Chart and office note reviewed in detail along with available xrays/ labs > agree with a/p as outlined  

## 2015-08-09 NOTE — Assessment & Plan Note (Signed)
Augmentin '875mg'$  Twice daily  For 10 days -take with food.  Mucinex DM Twice daily  As needed  Cough/congestion  Saline nasal rinses As needed   Chest xray today .  Please contact office for sooner follow up if symptoms do not improve or worsen or seek emergency care  Follow up Dr. Melvyn Novas  3 months and As needed

## 2015-08-09 NOTE — Assessment & Plan Note (Signed)
Cont follow up with Dr. Julien Nordmann

## 2015-08-12 ENCOUNTER — Telehealth: Payer: Self-pay | Admitting: Adult Health

## 2015-08-12 ENCOUNTER — Encounter (HOSPITAL_COMMUNITY)
Admission: RE | Admit: 2015-08-12 | Discharge: 2015-08-12 | Disposition: A | Discharge status: Home / Self Care | Payer: Medicare Other | Source: Ambulatory Visit | Attending: Thoracic Surgery (Cardiothoracic Vascular Surgery) | Admitting: Thoracic Surgery (Cardiothoracic Vascular Surgery)

## 2015-08-12 VITALS — Wt 173.5 lb

## 2015-08-12 DIAGNOSIS — J449 Chronic obstructive pulmonary disease, unspecified: Secondary | ICD-10-CM | POA: Diagnosis present

## 2015-08-12 DIAGNOSIS — C3431 Malignant neoplasm of lower lobe, right bronchus or lung: Secondary | ICD-10-CM

## 2015-08-12 NOTE — Telephone Encounter (Signed)
Last ov with TP on 08/09/15 Patient Instructions       Augmentin '875mg'$  Twice daily  For 10 days -take with food.   Mucinex DM Twice daily  As needed  Cough/congestion   Saline nasal rinses As needed    Chest xray today .   Please contact office for sooner follow up if symptoms do not improve or worsen or seek emergency care   Follow up Dr. Melvyn Novas  3 months and As needed     Called spoke with pt. He c/o diarrhea since starting the Augmentin. He states the sinus infection has improved since starting but he wanted to get recs from [facility] for the diarrhea. He states he wants to make sure the Augmentin was not to strong of a dose. I explained to him that I would send a message to MW for his recs. He voiced understanding and had no further questions.   Mw please advise

## 2015-08-12 NOTE — Telephone Encounter (Signed)
Usually if take with a large glass of water that's not a problem but if already tried this then just stop the augmentin and wait to see if better and hold further abx for now

## 2015-08-12 NOTE — Progress Notes (Signed)
Daily Session Note  Patient Details  Name: Elijah Marshall MRN: [id number] Date of Birth: Apr 22, 1950 Referring Provider:        Pulmonary Rehab Walk Test from 08/05/2015 in [place]   Referring Provider  Dr. Roxan Hockey      Encounter Date: 08/12/2015  Check In:     Session Check In - 08/12/15 1412    Check-In   Location [facility]   Staff Present Rosebud Poles, RN, BSN;Whitleigh Garramone Ysidro Evert, Felipe Drone, RN, MHA;Molly diVincenzo, MS, ACSM RCEP, Exercise Physiologist   Supervising physician immediately available to respond to emergencies [facility] immediately available   Physician(s) Dr. Cruzita Lederer   Medication changes reported     No   Fall or balance concerns reported    No   Warm-up and Cool-down Performed as group-led instruction   Resistance Training Performed Yes   VAD Patient? No   Pain Assessment   Currently in Pain? No/denies   Multiple Pain Sites No      Capillary Blood Glucose: No results found for this or any previous visit (from the past 24 hour(s)).      Exercise Prescription Changes - 08/12/15 1500    Response to Exercise   Blood Pressure (Admit) 144/80 mmHg   Blood Pressure (Exercise) 144/80 mmHg   Blood Pressure (Exit) 116/60 mmHg   Heart Rate (Admit) 90 bpm   Heart Rate (Exercise) 110 bpm   Heart Rate (Exit) 95 bpm   Oxygen Saturation (Admit) 97 %   Oxygen Saturation (Exercise) 91 %   Oxygen Saturation (Exit) 95 %   Rating of Perceived Exertion (Exercise) 13   Perceived Dyspnea (Exercise) 1   Duration Progress to 45 minutes of aerobic exercise without signs/symptoms of physical distress   Intensity THRR unchanged   Progression   Progression Continue to progress workloads to maintain intensity without signs/symptoms of physical distress.   Resistance Training   Training Prescription Yes   Weight blue bands   Reps 10-12   Interval Training   Interval Training No   Treadmill   MPH 2   Grade 0   Minutes 17   NuStep   Level 2   Minutes 17   METs 2     Goals Met:  Exercise tolerated well No report of cardiac concerns or symptoms Strength training completed today  Goals Unmet:  Not Applicable  Comments: Service time is from 1330 to 1530    Dr. Rush Farmer is Medical Director for Pulmonary Rehab at [facility].

## 2015-08-12 NOTE — Telephone Encounter (Signed)
Called spoke with pt. Reviewed MW's recs. He voiced understanding and had no further questions. Nothing further needed.

## 2015-08-16 ENCOUNTER — Other Ambulatory Visit: Payer: Self-pay | Admitting: Thoracic Surgery (Cardiothoracic Vascular Surgery)

## 2015-08-16 DIAGNOSIS — C3490 Malignant neoplasm of unspecified part of unspecified bronchus or lung: Secondary | ICD-10-CM

## 2015-08-17 ENCOUNTER — Encounter: Payer: Self-pay | Admitting: Thoracic Surgery (Cardiothoracic Vascular Surgery)

## 2015-08-17 ENCOUNTER — Ambulatory Visit (INDEPENDENT_AMBULATORY_CARE_PROVIDER_SITE_OTHER): Payer: Self-pay | Admitting: Thoracic Surgery (Cardiothoracic Vascular Surgery)

## 2015-08-17 ENCOUNTER — Encounter (HOSPITAL_COMMUNITY)
Admission: RE | Admit: 2015-08-17 | Discharge: 2015-08-17 | Disposition: A | Discharge status: Home / Self Care | Payer: Medicare Other | Source: Ambulatory Visit | Attending: Thoracic Surgery (Cardiothoracic Vascular Surgery) | Admitting: Thoracic Surgery (Cardiothoracic Vascular Surgery)

## 2015-08-17 VITALS — Wt 174.6 lb

## 2015-08-17 VITALS — BP 158/96 | HR 84 | Resp 16 | Ht 69.0 in | Wt 173.0 lb

## 2015-08-17 DIAGNOSIS — C3431 Malignant neoplasm of lower lobe, right bronchus or lung: Secondary | ICD-10-CM

## 2015-08-17 DIAGNOSIS — Z09 Encounter for follow-up examination after completed treatment for conditions other than malignant neoplasm: Secondary | ICD-10-CM

## 2015-08-17 DIAGNOSIS — J449 Chronic obstructive pulmonary disease, unspecified: Secondary | ICD-10-CM | POA: Diagnosis not present

## 2015-08-17 DIAGNOSIS — C3491 Malignant neoplasm of unspecified part of right bronchus or lung: Secondary | ICD-10-CM

## 2015-08-17 NOTE — Progress Notes (Signed)
Daily Session Note  Patient Details  Name: Elijah Marshall MRN: [id number] Date of Birth: Mar 30, 1950 Referring Provider:        Pulmonary Rehab Walk Test from 08/05/2015 in [place]   Referring Provider  Dr. Roxan Hockey      Encounter Date: 08/17/2015  Check In:     Session Check In - 08/17/15 1343    Check-In   Location [facility]   Staff Present Rodney Langton, RN;Marlenne Ridge Leonia Reeves, RN, Luisa Hart, RN, BSN;Ramon Dredge, RN, [facility]   Supervising physician immediately available to respond to emergencies [facility] immediately available   Physician(s) Dr. Marily Memos   Medication changes reported     No   Fall or balance concerns reported    No   Warm-up and Cool-down Performed as group-led instruction   Resistance Training Performed Yes   VAD Patient? No   Pain Assessment   Currently in Pain? No/denies   Multiple Pain Sites No      Capillary Blood Glucose: No results found for this or any previous visit (from the past 24 hour(s)).      Exercise Prescription Changes - 08/17/15 1700    Response to Exercise   Blood Pressure (Admit) 132/64 mmHg   Blood Pressure (Exercise) 142/72 mmHg   Blood Pressure (Exit) 132/80 mmHg   Heart Rate (Admit) 97 bpm   Heart Rate (Exercise) 124 bpm   Heart Rate (Exit) 109 bpm   Oxygen Saturation (Admit) 96 %   Oxygen Saturation (Exercise) 94 %   Oxygen Saturation (Exit) 95 %   Rating of Perceived Exertion (Exercise) 13   Perceived Dyspnea (Exercise) 1   Duration Progress to 45 minutes of aerobic exercise without signs/symptoms of physical distress   Intensity THRR unchanged   Progression   Progression Continue to progress workloads to maintain intensity without signs/symptoms of physical distress.   Resistance Training   Training Prescription Yes   Weight blue bands   Reps 10-12   Interval Training   Interval Training No   Treadmill   MPH 2   Grade 0   Minutes 17   Bike   Level 0.5   Minutes 17   NuStep   Level 2   Minutes 17   METs 2.6     Goals Met:  Exercise tolerated well Strength training completed today  Goals Unmet:  Not Applicable  Comments: Service time is from 1330 to 1515    Dr. Rush Farmer is Medical Director for Pulmonary Rehab at [facility].

## 2015-08-17 NOTE — Progress Notes (Signed)
[address]       [place] [address]             [phone]       HPI: Mr. Lantzy returns for a scheduled follow-up visit.  He is a 65 year old gentleman with a history of tobacco abuse and COPD had a right lower lobe superior segmentectomy on 05/25/2015. He had a T1a, N0, stage IA non-small cell carcinoma. His initial postoperative course was remarkable for inability to wean oxygen. He had a CT which showed no evidence of pulmonary embolus. He was discharged on home oxygen at 2 L nasal cannula. He is now off the oxygen.  He was treated with Keflex for infection of the chest tube site.  He has seen Dr. Julien Nordmann. No adjuvant chemotherapy was recommended  Recently has been having trouble with sinus drainage. He was given a prescription for Nasacort, but stopped that due to headaches. He also was given a prescription for Augmentin, but he does not think that is helping.  Past Medical History  Diagnosis Date  . Allergy   . Ulcer   . COPD (chronic obstructive pulmonary disease) ([place])     per patient's health survey - he put a ?  . GERD (gastroesophageal reflux disease)   . Arthritis   . Shortness of breath   . Hypertension   . Depression   . Cancer [facility])     unsure at this time  . Cancer of lower lobe of right lung ([place])   . Non-small cell carcinoma of lung, right ([place]) 06/24/2015      Current Outpatient Prescriptions  Medication Sig Dispense Refill  . Albuterol Sulfate (PROAIR RESPICLICK) 277 (90 BASE) MCG/ACT AEPB Inhale 2 puffs into the lungs every 4 (four) hours as needed. 1 each 11  . amoxicillin-clavulanate (AUGMENTIN) 875-125 MG tablet Take 1 tablet by mouth 2 (two) times daily. 20 tablet 0  . budesonide-formoterol (SYMBICORT) 80-4.5 MCG/ACT inhaler Inhale 2 puffs into the lungs 2 (two) times daily. 1 Inhaler 5  . clonazePAM (KLONOPIN) 1 MG tablet Take 1/2 tablet as needed twice a day for anxiety and 1-2 tablets at night as needed for sleep 60  tablet 0  . dexlansoprazole (DEXILANT) 60 MG capsule Take 1 capsule (60 mg total) by mouth daily. 30 capsule 5  . levocetirizine (XYZAL) 5 MG tablet Take 1 tablet (5 mg total) by mouth every evening. 30 tablet 11  . metoprolol tartrate (LOPRESSOR) 25 MG tablet TAKE 1/2 A TABLET BY MOUTH TWICE A DAY 30 tablet 1  . montelukast (SINGULAIR) 10 MG tablet Take 1 tablet (10 mg total) by mouth at bedtime. 30 tablet 3  . polyethylene glycol (MIRALAX) packet Take 17 g by mouth daily.    . pravastatin (PRAVACHOL) 40 MG tablet TAKE 1 TABLET BY MOUTH AT BEDTIME 90 tablet 0  . ranitidine (ZANTAC) 150 MG capsule Take 150 mg by mouth every evening. Take 2 tablets HS    . senna (SENOKOT) 8.6 MG TABS tablet Take 2 tablets (17.2 mg total) by mouth at bedtime. 120 each 0  . traMADol (ULTRAM) 50 MG tablet Take by mouth every 6 (six) hours as needed for moderate pain.    Marland Kitchen HYDROcodone-acetaminophen (NORCO/VICODIN) 5-325 MG tablet Take 1-2 tablets by mouth every 6 (six) hours as needed for moderate pain. 75 tablet 0  . triamcinolone (NASACORT ALLERGY 24HR) 55 MCG/ACT AERO nasal inhaler Place 2 sprays into the nose daily. Reported on 08/17/2015  No current facility-administered medications for this visit.    Physical Exam BP 158/96 mmHg  Pulse 84  Resp 16  Ht '5\' 9"'$  (1.753 m)  Wt 173 lb (78.472 kg)  BMI 25.54 kg/m2  SpO37 73% 65 year old man in no acute distress Alert and oriented 3 with no focal deficits Lungs clear with equal breath is bilaterally, no rales or wheezing Incisions healing well  Diagnostic Tests: CHEST 2 VIEW  COMPARISON: 06/15/2015  FINDINGS: Stable postoperative changes involving the right lower lobe. No findings suspicious for recurrent tumor. No acute pulmonary abnormality. The bony thorax is intact.  IMPRESSION: Expected postoperative changes involving the right lower lobe. No findings for recurrent tumor or acute pulmonary abnormality.   Electronically Signed  By:  Marijo Sanes M.D.  On: 08/09/2015 15:16 I personally reviewed the chest x-ray confirmed the findings noted above.  Impression: 65 year old man who is now about 3 months out from a right lower lobe superior segmentectomy for stage IA non-small cell carcinoma.  Overall is doing well. He looks much better than he did at his last visit. He is having some upper respiratory complaints which are being treated.  He does still have some incisional discomfort, which is not unexpected this early after the operation and should improve over time.  Dr. Julien Nordmann will do his primary lung cancer follow-up.  Hypertension- his blood pressures been pretty consistently elevated over his past 3 office visits. He is on metoprolol 12.5 mg twice a day. He has an appointment with Dr. Brigitte Pulse coming up later on this month. I will defer on that issue.  His activities are unrestricted.  Plan: Return in 3 months with PA and lateral chest x-ray  [name], MD [facility] [phone]

## 2015-08-18 ENCOUNTER — Telehealth: Payer: Self-pay | Admitting: Adult Health

## 2015-08-18 DIAGNOSIS — J329 Chronic sinusitis, unspecified: Secondary | ICD-10-CM

## 2015-08-18 NOTE — Telephone Encounter (Signed)
Per 08/09/15 OV w/ TP: Patient Instructions       Augmentin '875mg'$  Twice daily  For 10 days -take with food.   Mucinex DM Twice daily  As needed  Cough/congestion   Saline nasal rinses As needed    Chest xray today .   Please contact office for sooner follow up if symptoms do not improve or worsen or seek emergency care   Follow up Dr. Melvyn Novas  3 months and As needed   --  Spoke with pt. He reports he has 1 day of ABX left. C/o having HA, some nasal cong, left nostril pain, blowing out white phlem from nose.  Denies any f/c/s/n/v/no cough. Requesting recs. Please advise MW thanks

## 2015-08-18 NOTE — Telephone Encounter (Signed)
Spoke with pt and is aware referral placed. Nothing further needed

## 2015-08-18 NOTE — Telephone Encounter (Signed)
Ref to ent Dr Victorio Palm group

## 2015-08-19 ENCOUNTER — Encounter (HOSPITAL_COMMUNITY)
Admission: RE | Admit: 2015-08-19 | Discharge: 2015-08-19 | Disposition: A | Discharge status: Home / Self Care | Payer: Medicare Other | Source: Ambulatory Visit | Attending: Thoracic Surgery (Cardiothoracic Vascular Surgery) | Admitting: Thoracic Surgery (Cardiothoracic Vascular Surgery)

## 2015-08-19 VITALS — Wt 174.6 lb

## 2015-08-19 DIAGNOSIS — C3431 Malignant neoplasm of lower lobe, right bronchus or lung: Secondary | ICD-10-CM

## 2015-08-19 DIAGNOSIS — J449 Chronic obstructive pulmonary disease, unspecified: Secondary | ICD-10-CM | POA: Diagnosis not present

## 2015-08-19 NOTE — Progress Notes (Signed)
Pulmonary Individual Treatment Plan  Patient Details  Name: Elijah Marshall MRN: [id number] Date of Birth: [birth year] Referring Provider:        Pulmonary Rehab Walk Test from 08/05/2015 in [place]   Referring Provider  Dr. Roxan Hockey      Initial Encounter Date:       Pulmonary Rehab Walk Test from 08/05/2015 in [place]   Date  08/05/15   Referring Provider  Dr. Roxan Hockey      Visit Diagnosis: No diagnosis found.  Patient's Home Medications on Admission:   Current outpatient prescriptions:  .  Albuterol Sulfate (PROAIR RESPICLICK) 702 (90 BASE) MCG/ACT AEPB, Inhale 2 puffs into the lungs every 4 (four) hours as needed., Disp: 1 each, Rfl: 11 .  amoxicillin-clavulanate (AUGMENTIN) 875-125 MG tablet, Take 1 tablet by mouth 2 (two) times daily., Disp: 20 tablet, Rfl: 0 .  budesonide-formoterol (SYMBICORT) 80-4.5 MCG/ACT inhaler, Inhale 2 puffs into the lungs 2 (two) times daily., Disp: 1 Inhaler, Rfl: 5 .  clonazePAM (KLONOPIN) 1 MG tablet, Take 1/2 tablet as needed twice a day for anxiety and 1-2 tablets at night as needed for sleep, Disp: 60 tablet, Rfl: 0 .  dexlansoprazole (DEXILANT) 60 MG capsule, Take 1 capsule (60 mg total) by mouth daily., Disp: 30 capsule, Rfl: 5 .  HYDROcodone-acetaminophen (NORCO/VICODIN) 5-325 MG tablet, Take 1-2 tablets by mouth every 6 (six) hours as needed for moderate pain., Disp: 75 tablet, Rfl: 0 .  levocetirizine (XYZAL) 5 MG tablet, Take 1 tablet (5 mg total) by mouth every evening., Disp: 30 tablet, Rfl: 11 .  metoprolol tartrate (LOPRESSOR) 25 MG tablet, TAKE 1/2 A TABLET BY MOUTH TWICE A DAY, Disp: 30 tablet, Rfl: 1 .  montelukast (SINGULAIR) 10 MG tablet, Take 1 tablet (10 mg total) by mouth at bedtime., Disp: 30 tablet, Rfl: 3 .  polyethylene glycol (MIRALAX) packet, Take 17 g by mouth daily., Disp: , Rfl:  .  pravastatin (PRAVACHOL) 40 MG tablet, TAKE 1 TABLET BY MOUTH AT  BEDTIME, Disp: 90 tablet, Rfl: 0 .  ranitidine (ZANTAC) 150 MG capsule, Take 150 mg by mouth every evening. Take 2 tablets HS, Disp: , Rfl:  .  senna (SENOKOT) 8.6 MG TABS tablet, Take 2 tablets (17.2 mg total) by mouth at bedtime., Disp: 120 each, Rfl: 0 .  traMADol (ULTRAM) 50 MG tablet, Take by mouth every 6 (six) hours as needed for moderate pain., Disp: , Rfl:  .  triamcinolone (NASACORT ALLERGY 24HR) 55 MCG/ACT AERO nasal inhaler, Place 2 sprays into the nose daily. Reported on 08/17/2015, Disp: , Rfl:   Past Medical History: Past Medical History  Diagnosis Date  . Allergy   . Ulcer   . COPD (chronic obstructive pulmonary disease) ([place])     per patient's health survey - he put a ?  . GERD (gastroesophageal reflux disease)   . Arthritis   . Shortness of breath   . Hypertension   . Depression   . Cancer [facility])     unsure at this time  . Cancer of lower lobe of right lung ([place])   . Non-small cell carcinoma of lung, right ([place]) 06/24/2015    Tobacco Use: History  Smoking status  . Former Smoker -- 1.00 packs/day for 35 years  . Types: Cigarettes  . Quit date: 09/24/2011  Smokeless tobacco  . Never Used    Labs: Recent Review Flowsheet Data    Labs for ITP Cardiac and  Pulmonary Rehab Latest Ref Rng 04/15/2015 05/20/2015 05/25/2015   Cholestrol 125 - 200 mg/dL 260(H) - -   LDLCALC <130 mg/dL 171(H) - -   HDL >=40 mg/dL 67 - -   Trlycerides <150 mg/dL 108 - -   PHART 7.350 - 7.450 - 7.414 7.393   PCO2ART 35.0 - 45.0 mmHg - 39.8 38.5   HCO3 20.0 - 24.0 mEq/L - 25.0(H) 23.0   TCO2 0 - 100 mmol/L - 26.2 24.1   ACIDBASEDEF 0.0 - 2.0 mmol/L - - 1.3   O2SAT - - 94.1 94.7      Capillary Blood Glucose: Lab Results  Component Value Date   GLUCAP 76 05/25/2015   GLUCAP 115* 05/25/2015   GLUCAP 83 05/25/2015   GLUCAP 96 05/24/2015   GLUCAP 98 05/24/2015     ADL UCSD:     Pulmonary Assessment Scores      08/03/15 1504       ADL UCSD   ADL Phase Entry     SOB Score  total 39        Pulmonary Function Assessment:     Pulmonary Function Assessment - 08/02/15 1025    Breath   Bilateral Breath Sounds Clear   Shortness of Breath No      Exercise Target Goals:    Exercise Program Goal: Individual exercise prescription set with THRR, safety & activity barriers. Participant demonstrates ability to understand and report RPE using BORG scale, to self-measure pulse accurately, and to acknowledge the importance of the exercise prescription.  Exercise Prescription Goal: Starting with aerobic activity 30 plus minutes a day, 3 days per week for initial exercise prescription. Provide home exercise prescription and guidelines that participant acknowledges understanding prior to discharge.  Activity Barriers & Risk Stratification:     Activity Barriers & Cardiac Risk Stratification - 08/02/15 1023    Activity Barriers & Cardiac Risk Stratification   Activity Barriers Arthritis;Back Problems;Deconditioning;Muscular Weakness;Shortness of Breath;Incisional Pain;Right Knee Replacement      6 Minute Walk:     6 Minute Walk      08/05/15 1646       6 Minute Walk   Phase Initial     Distance 1410 feet     Walk Time 6 minutes     # of Rest Breaks 0     MPH 2.67     METS 2.99     RPE 13     Perceived Dyspnea  1     Symptoms No     Resting HR 121 bpm     Resting BP 150/88 mmHg     Max Ex. HR 121 bpm     Max Ex. BP 178/84 mmHg     2 Minute Post BP 148/84 mmHg     Interval HR   Baseline HR 89     1 Minute HR 97     2 Minute HR 94     3 Minute HR 89     4 Minute HR 88     5 Minute HR 87     6 Minute HR 87     2 Minute Post HR 96     Interval Heart Rate? Yes     Interval Oxygen   Baseline Oxygen Saturation % 96 %     Baseline Liters of Oxygen 0 L     1 Minute Oxygen Saturation % 97 %     1 Minute Liters of Oxygen 0 L     2 Minute  Oxygen Saturation % 94 %     2 Minute Liters of Oxygen 0 L     3 Minute Oxygen Saturation % 89 %     3 Minute  Liters of Oxygen 0 L     4 Minute Oxygen Saturation % 88 %     4 Minute Liters of Oxygen 0 L     5 Minute Oxygen Saturation % 87 %     5 Minute Liters of Oxygen 0 L     6 Minute Oxygen Saturation % 87 %     6 Minute Liters of Oxygen 0 L     2 Minute Post Oxygen Saturation % 96 %     2 Minute Post Liters of Oxygen 0 L        Initial Exercise Prescription:     Initial Exercise Prescription - 08/05/15 1600    Date of Initial Exercise RX and Referring Provider   Date 08/05/15   Referring Provider Dr. Roxan Hockey   Treadmill   MPH 1.7   Grade 0   Minutes 17   NuStep   Level 2   Minutes 17   METs 1.5   Elliptical   Level 2   Speed 1   Minutes 17   Prescription Details   Frequency (times per week) 2   Duration Progress to 45 minutes of aerobic exercise without signs/symptoms of physical distress   Intensity   THRR 40-80% of Max Heartrate 62-125   Ratings of Perceived Exertion 11-13   Perceived Dyspnea 0-4   Progression   Progression Continue progressive overload as per policy without signs/symptoms or physical distress.   Resistance Training   Training Prescription Yes   Weight blue bands   Reps 10-12      Perform Capillary Blood Glucose checks as needed.  Exercise Prescription Changes:     Exercise Prescription Changes      08/12/15 1500 08/17/15 1700         Response to Exercise   Blood Pressure (Admit) 144/80 mmHg 132/64 mmHg      Blood Pressure (Exercise) 144/80 mmHg 142/72 mmHg      Blood Pressure (Exit) 116/60 mmHg 132/80 mmHg      Heart Rate (Admit) 90 bpm 97 bpm      Heart Rate (Exercise) 110 bpm 124 bpm      Heart Rate (Exit) 95 bpm 109 bpm      Oxygen Saturation (Admit) 97 % 96 %      Oxygen Saturation (Exercise) 91 % 94 %      Oxygen Saturation (Exit) 95 % 95 %      Rating of Perceived Exertion (Exercise) 13 13      Perceived Dyspnea (Exercise) 1 1      Duration Progress to 45 minutes of aerobic exercise without signs/symptoms of physical  distress Progress to 45 minutes of aerobic exercise without signs/symptoms of physical distress      Intensity THRR unchanged THRR unchanged      Progression   Progression Continue to progress workloads to maintain intensity without signs/symptoms of physical distress. Continue to progress workloads to maintain intensity without signs/symptoms of physical distress.      Resistance Training   Training Prescription Yes Yes      Weight blue bands blue bands      Reps 10-12 10-12      Interval Training   Interval Training No No      Treadmill   MPH 2 2  Grade 0 0      Minutes 17 17      Bike   Level  0.5      Minutes  17      NuStep   Level 2 2      Minutes 17 17      METs 2 2.6         Exercise Comments:     Exercise Comments      08/19/15 6213           Exercise Comments Just started program, has exercised 2 sessions, tolerating well          Discharge Exercise Prescription (Final Exercise Prescription Changes):     Exercise Prescription Changes - 08/17/15 1700    Response to Exercise   Blood Pressure (Admit) 132/64 mmHg   Blood Pressure (Exercise) 142/72 mmHg   Blood Pressure (Exit) 132/80 mmHg   Heart Rate (Admit) 97 bpm   Heart Rate (Exercise) 124 bpm   Heart Rate (Exit) 109 bpm   Oxygen Saturation (Admit) 96 %   Oxygen Saturation (Exercise) 94 %   Oxygen Saturation (Exit) 95 %   Rating of Perceived Exertion (Exercise) 13   Perceived Dyspnea (Exercise) 1   Duration Progress to 45 minutes of aerobic exercise without signs/symptoms of physical distress   Intensity THRR unchanged   Progression   Progression Continue to progress workloads to maintain intensity without signs/symptoms of physical distress.   Resistance Training   Training Prescription Yes   Weight blue bands   Reps 10-12   Interval Training   Interval Training No   Treadmill   MPH 2   Grade 0   Minutes 17   Bike   Level 0.5   Minutes 17   NuStep   Level 2   Minutes 17   METs 2.6        Nutrition:  Target Goals: Understanding of nutrition guidelines, daily intake of sodium '1500mg'$ , cholesterol '200mg'$ , calories 30% from fat and 7% or less from saturated fats, daily to have 5 or more servings of fruits and vegetables.  Biometrics:     Pre Biometrics - 08/02/15 1032    Pre Biometrics   Grip Strength 50 kg       Nutrition Therapy Plan and Nutrition Goals:   Nutrition Discharge: Rate Your Plate Scores:   Psychosocial: Target Goals: Acknowledge presence or absence of depression, maximize coping skills, provide positive support system. Participant is able to verbalize types and ability to use techniques and skills needed for reducing stress and depression.  Initial Review & Psychosocial Screening:     Initial Psych Review & Screening - 08/02/15 [place]? Yes   Barriers   Psychosocial barriers to participate in program There are no identifiable barriers or psychosocial needs.   Screening Interventions   Interventions Encouraged to exercise      Quality of Life Scores:     Quality of Life - 08/03/15 1504    Quality of Life Scores   Health/Function Pre 16.23 %   Socioeconomic Pre 17.44 %   Psych/Spiritual Pre 27.43 %   Family Pre 22.5 %   GLOBAL Pre 19.5 %      PHQ-9:     Recent Review Flowsheet Data    Depression screen [facility] 2/9 08/02/2015 06/24/2015 04/15/2015 01/15/2015 10/20/2014   Decreased Interest 0 0 0 0 0   Down, Depressed, Hopeless 0 1 2 0 0  PHQ - 2 Score 0 1 2 0 0   Altered sleeping - - 2 - -   Tired, decreased energy - - 3 - -   Change in appetite - - 0 - -   Feeling bad or failure about yourself  - - 3 - -   Trouble concentrating - - 0 - -   Moving slowly or fidgety/restless - - 1 - -   Suicidal thoughts - - 1 - -   PHQ-9 Score - - 12 - -      Psychosocial Evaluation and Intervention:     Psychosocial Evaluation - 08/02/15 1036    Psychosocial Evaluation & Interventions    Interventions Encouraged to exercise with the program and follow exercise prescription   Continued Psychosocial Services Needed No      Psychosocial Re-Evaluation:     Psychosocial Re-Evaluation      08/16/15 1357           Psychosocial Re-Evaluation   Interventions Encouraged to attend Pulmonary Rehabilitation for the exercise       Comments No psychosocial issues identified at this time.       Continued Psychosocial Services Needed No         Education: Education Goals: Education classes will be provided on a weekly basis, covering required topics. Participant will state understanding/return demonstration of topics presented.  Learning Barriers/Preferences:     Learning Barriers/Preferences - 08/02/15 1024    Learning Barriers/Preferences   Learning Barriers None   Learning Preferences Skilled Demonstration;Group Instruction      Education Topics: Risk Factor Reduction:  -Group instruction that is supported by a PowerPoint presentation. Instructor discusses the definition of a risk factor, different risk factors for pulmonary disease, and how the heart and lungs work together.     Nutrition for Pulmonary Patient:  -Group instruction provided by PowerPoint slides, verbal discussion, and written materials to support subject matter. The instructor gives an explanation and review of healthy diet recommendations, which includes a discussion on weight management, recommendations for fruit and vegetable consumption, as well as protein, fluid, caffeine, fiber, sodium, sugar, and alcohol. Tips for eating when patients are short of breath are discussed.   Pursed Lip Breathing:  -Group instruction that is supported by demonstration and informational handouts. Instructor discusses the benefits of pursed lip and diaphragmatic breathing and detailed demonstration on how to preform both.     Oxygen Safety:  -Group instruction provided by PowerPoint, verbal discussion, and written  material to support subject matter. There is an overview of "What is Oxygen" and "Why do we need it".  Instructor also reviews how to create a safe environment for oxygen use, the importance of using oxygen as prescribed, and the risks of noncompliance. There is a brief discussion on traveling with oxygen and resources the patient may utilize.   Oxygen Equipment:  -Group instruction provided by [facility] Staff utilizing handouts, written materials, and equipment demonstrations.   Signs and Symptoms:  -Group instruction provided by written material and verbal discussion to support subject matter. Warning signs and symptoms of infection, stroke, and heart attack are reviewed and when to call the physician/911 reinforced. Tips for preventing the spread of infection discussed.   Advanced Directives:  -Group instruction provided by verbal instruction and written material to support subject matter. Instructor reviews Advanced Directive laws and proper instruction for filling out document.   Pulmonary Video:  -Group video education that reviews the importance of medication and oxygen compliance, exercise, good  nutrition, pulmonary hygiene, and pursed lip and diaphragmatic breathing for the pulmonary patient.   Exercise for the Pulmonary Patient:  -Group instruction that is supported by a PowerPoint presentation. Instructor discusses benefits of exercise, core components of exercise, frequency, duration, and intensity of an exercise routine, importance of utilizing pulse oximetry during exercise, safety while exercising, and options of places to exercise outside of rehab.            PULMONARY REHAB OTHER RESPIRATORY from 08/12/2015 in [place]   Date  08/12/15   Educator  EP   Instruction Review Code  2- meets goals/outcomes      Pulmonary Medications:  -Verbally interactive group education provided by instructor with focus on inhaled medications and proper  administration.   Anatomy and Physiology of the Respiratory System and Intimacy:  -Group instruction provided by PowerPoint, verbal discussion, and written material to support subject matter. Instructor reviews respiratory cycle and anatomical components of the respiratory system and their functions. Instructor also reviews differences in obstructive and restrictive respiratory diseases with examples of each. Intimacy, Sex, and Sexuality differences are reviewed with a discussion on how relationships can change when diagnosed with pulmonary disease. Common sexual concerns are reviewed.   Knowledge Questionnaire Score:     Knowledge Questionnaire Score - 08/03/15 1504    Knowledge Questionnaire Score   Pre Score 8/13      Core Components/Risk Factors/Patient Goals at Admission:     Personal Goals and Risk Factors at Admission - 08/02/15 1033    Core Components/Risk Factors/Patient Goals on Admission   Increase Strength and Stamina Yes   Intervention Provide advice, education, support and counseling about physical activity/exercise needs.;Develop an individualized exercise prescription for aerobic and resistive training based on initial evaluation findings, risk stratification, comorbidities and participant's personal goals.   Expected Outcomes Achievement of increased cardiorespiratory fitness and enhanced flexibility, muscular endurance and strength shown through measurements of functional capacity and personal statement of participant.   Improve shortness of breath with ADL's Yes   Intervention Provide education, individualized exercise plan and daily activity instruction to help decrease symptoms of SOB with activities of daily living.   Expected Outcomes Short Term: Achieves a reduction of symptoms when performing activities of daily living.      Core Components/Risk Factors/Patient Goals Review:      Goals and Risk Factor Review      08/02/15 1034 08/16/15 1351         Core  Components/Risk Factors/Patient Goals Review   Personal Goals Review Increase Strength and Stamina;Improve shortness of breath with ADL's Increase Strength and Stamina;Improve shortness of breath with ADL's      Review Increase strength and stamina by increasing exercise has only exercised 3 sessions with [place], too early to see an improvement      Expected Outcomes Improved strength and stamina as before lung surgery Should see an increase in strength and stamina in the next 30 days         Core Components/Risk Factors/Patient Goals at Discharge (Final Review):      Goals and Risk Factor Review - 08/16/15 1351    Core Components/Risk Factors/Patient Goals Review   Personal Goals Review Increase Strength and Stamina;Improve shortness of breath with ADL's   Review has only exercised 3 sessions with [place], too early to see an improvement   Expected Outcomes Should see an increase in strength and stamina in the next 30 days      ITP Comments:  Comments: ITP REVIEW Pt is making expected progress toward personal goals after completing 2 sessions.   Recommend continued exercise, life style modification, education, and utilization of breathing techniques to increase stamina and strength and decrease shortness of breath with exertion.

## 2015-08-19 NOTE — Progress Notes (Signed)
Daily Session Note  Patient Details  Name: Elijah Marshall MRN: [id number] Date of Birth: 1950/10/04 Referring Provider:        Pulmonary Rehab Walk Test from 08/05/2015 in [place]   Referring Provider  Dr. Roxan Hockey      Encounter Date: 08/19/2015  Check In:     Session Check In - 08/19/15 1412    Check-In   Location [facility]   Staff Present Rosebud Poles, RN, BSN;Uchenna Seufert Ysidro Evert, RN;Portia Rollene Rotunda, RN, BSN;Ramon Dredge, RN, [facility]   Supervising physician immediately available to respond to emergencies [facility] immediately available   Physician(s) Dr. Marthenia Rolling   Medication changes reported     No   Fall or balance concerns reported    No   Warm-up and Cool-down Performed as group-led instruction   Resistance Training Performed Yes   VAD Patient? No   Pain Assessment   Currently in Pain? No/denies   Multiple Pain Sites No      Capillary Blood Glucose: No results found for this or any previous visit (from the past 24 hour(s)).      Exercise Prescription Changes - 08/19/15 1600    Exercise Review   Progression Yes   Response to Exercise   Blood Pressure (Admit) 122/76 mmHg   Blood Pressure (Exercise) 156/80 mmHg   Blood Pressure (Exit) 120/72 mmHg   Heart Rate (Admit) 87 bpm   Heart Rate (Exercise) 107 bpm   Heart Rate (Exit) 92 bpm   Oxygen Saturation (Admit) 95 %   Oxygen Saturation (Exercise) 91 %   Oxygen Saturation (Exit) 94 %   Rating of Perceived Exertion (Exercise) 11   Perceived Dyspnea (Exercise) 1   Duration Progress to 45 minutes of aerobic exercise without signs/symptoms of physical distress   Intensity THRR New   Progression   Progression Continue to progress workloads to maintain intensity without signs/symptoms of physical distress.   Resistance Training   Training Prescription Yes   Weight blue bands   Reps 10-12   Interval Training   Interval Training No   Treadmill   MPH 2   Grade 0   Minutes 17   NuStep   Level 3   Minutes 17   METs 2.5     Goals Met:  Exercise tolerated well No report of cardiac concerns or symptoms Strength training completed today  Goals Unmet:  Not Applicable  Comments: Service time is from 1330 to 1530    Dr. Rush Farmer is Medical Director for Pulmonary Rehab at [facility].

## 2015-08-24 ENCOUNTER — Encounter (HOSPITAL_COMMUNITY)
Admission: RE | Admit: 2015-08-24 | Discharge: 2015-08-24 | Disposition: A | Discharge status: Home / Self Care | Payer: Medicare Other | Source: Ambulatory Visit | Attending: Thoracic Surgery (Cardiothoracic Vascular Surgery) | Admitting: Thoracic Surgery (Cardiothoracic Vascular Surgery)

## 2015-08-24 DIAGNOSIS — C3431 Malignant neoplasm of lower lobe, right bronchus or lung: Secondary | ICD-10-CM

## 2015-08-24 DIAGNOSIS — J449 Chronic obstructive pulmonary disease, unspecified: Secondary | ICD-10-CM | POA: Diagnosis not present

## 2015-08-24 NOTE — Progress Notes (Signed)
Daily Session Note  Patient Details  Name: Elijah Marshall MRN: [id number] Date of Birth: 30-Jul-1950 Referring Provider:        Pulmonary Rehab Walk Test from 08/05/2015 in [place]   Referring Provider  Dr. Roxan Hockey      Encounter Date: 08/24/2015  Check In:     Session Check In - 08/24/15 1511    Check-In   Location [facility]   Staff Present Su Hilt, MS, ACSM RCEP, Exercise Physiologist;Annedrea Stackhouse, RN, MHA;Portia Rollene Rotunda, RN, BSN;Saryna Kneeland Ysidro Evert, RN;Joan Leonia Reeves, RN, BSN   Supervising physician immediately available to respond to emergencies [facility] immediately available   Physician(s) Dr. Marily Memos   Medication changes reported     No   Fall or balance concerns reported    No   Warm-up and Cool-down Performed as group-led instruction   Resistance Training Performed No   VAD Patient? No   Pain Assessment   Currently in Pain? No/denies   Multiple Pain Sites No      Capillary Blood Glucose: No results found for this or any previous visit (from the past 24 hour(s)).      Exercise Prescription Changes - 08/24/15 1500    Exercise Review   Progression Yes   Response to Exercise   Blood Pressure (Admit) 128/70 mmHg   Blood Pressure (Exercise) 142/76 mmHg   Blood Pressure (Exit) 118/84 mmHg   Heart Rate (Admit) 92 bpm   Heart Rate (Exercise) 103 bpm   Heart Rate (Exit) 98 bpm   Oxygen Saturation (Admit) 95 %   Oxygen Saturation (Exercise) 91 %   Oxygen Saturation (Exit) 95 %   Rating of Perceived Exertion (Exercise) 13   Perceived Dyspnea (Exercise) 2   Duration Progress to 45 minutes of aerobic exercise without signs/symptoms of physical distress   Intensity THRR unchanged   Progression   Progression Continue to progress workloads to maintain intensity without signs/symptoms of physical distress.   Resistance Training   Training Prescription Yes   Weight blue bands   Reps 10-12  10 minutes  of strength training   Interval Training   Interval Training No   Treadmill   MPH 2   Grade 0   Minutes 17   Bike   Level 1   Minutes 17   NuStep   Level 4   Minutes 17   METs 3     Goals Met:  Exercise tolerated well No report of cardiac concerns or symptoms Strength training completed today  Goals Unmet:  Not Applicable  Comments: Service time is from 1330 to 1500    Dr. Rush Farmer is Medical Director for Pulmonary Rehab at [facility].

## 2015-08-26 ENCOUNTER — Encounter: Payer: Self-pay | Admitting: Family Medicine

## 2015-08-26 ENCOUNTER — Encounter (HOSPITAL_COMMUNITY): Payer: Medicare Other

## 2015-08-26 ENCOUNTER — Ambulatory Visit (INDEPENDENT_AMBULATORY_CARE_PROVIDER_SITE_OTHER): Payer: Managed Care, Other (non HMO) | Admitting: Family Medicine

## 2015-08-26 VITALS — BP 124/80 | HR 90 | Temp 97.9°F | Resp 18 | Ht 69.0 in | Wt 175.0 lb

## 2015-08-26 DIAGNOSIS — E785 Hyperlipidemia, unspecified: Secondary | ICD-10-CM | POA: Diagnosis not present

## 2015-08-26 DIAGNOSIS — K219 Gastro-esophageal reflux disease without esophagitis: Secondary | ICD-10-CM | POA: Diagnosis not present

## 2015-08-26 DIAGNOSIS — C3491 Malignant neoplasm of unspecified part of right bronchus or lung: Secondary | ICD-10-CM

## 2015-08-26 DIAGNOSIS — J329 Chronic sinusitis, unspecified: Secondary | ICD-10-CM

## 2015-08-26 DIAGNOSIS — R490 Dysphonia: Secondary | ICD-10-CM | POA: Diagnosis not present

## 2015-08-26 DIAGNOSIS — I10 Essential (primary) hypertension: Secondary | ICD-10-CM

## 2015-08-26 DIAGNOSIS — J310 Chronic rhinitis: Secondary | ICD-10-CM

## 2015-08-26 DIAGNOSIS — J449 Chronic obstructive pulmonary disease, unspecified: Secondary | ICD-10-CM | POA: Diagnosis not present

## 2015-08-26 LAB — POCT CBC
Granulocyte percent: 61.9 %G (ref 37–80)
HCT, POC: 50.4 % (ref 43.5–53.7)
Hemoglobin: 17 g/dL (ref 14.1–18.1)
Lymph, poc: 1.9 (ref 0.6–3.4)
MCH, POC: 29.5 pg (ref 27–31.2)
MCHC: 33.8 g/dL (ref 31.8–35.4)
MCV: 87.3 fL (ref 80–97)
MID (cbc): 0.3 (ref 0–0.9)
MPV: 7 fL (ref 0–99.8)
POC Granulocyte: 3.5 (ref 2–6.9)
POC LYMPH PERCENT: 33.6 %L (ref 10–50)
POC MID %: 4.5 %M (ref 0–12)
Platelet Count, POC: 258 10*3/uL (ref 142–424)
RBC: 5.77 M/uL (ref 4.69–6.13)
RDW, POC: 13.7 %
WBC: 5.7 10*3/uL (ref 4.6–10.2)

## 2015-08-26 MED ORDER — MONTELUKAST SODIUM 10 MG PO TABS: 10.0000 mg | ORAL_TABLET | Freq: Every day | ORAL | Status: DC

## 2015-08-26 MED ORDER — PREDNISONE 20 MG PO TABS: ORAL_TABLET | ORAL | Status: DC

## 2015-08-26 MED ORDER — GUAIFENESIN ER 1200 MG PO TB12: 1.0000 | ORAL_TABLET | Freq: Two times a day (BID) | ORAL | Status: DC | PRN

## 2015-08-26 MED ORDER — LEVOCETIRIZINE DIHYDROCHLORIDE 5 MG PO TABS: 5.0000 mg | ORAL_TABLET | Freq: Every evening | ORAL | Status: DC

## 2015-08-26 NOTE — Patient Instructions (Addendum)
The most common causes of chronic cough in immunocompetent adults include the following: upper airway cough syndrome (UACS), previously referred to as postnasal drip syndrome (PNDS), which is caused by variety of rhinosinus conditions.  It's frequently impossible to sort out how much is chronic rhinitis/sinusitis with freq sniffing.  Restart the at night allergy medication xyzal and singulair. Go ahead and fill the prednisone taper but watch out to not catch any other illness as it can suppress your immune system. Suppress your cough and throat clearing with candy and delsym.  Use nasal saline spray every 1-2 hours during the day.  Start a cool-mist humidifier next to your bed at night, use vics vaporub or other mentholated cream to open up your nose and ensure everything is draining.  If you symptoms do not improve, we may want to consider repeat sinus imaging or a second opinion from a different ENT.   Come back some morning when you are fasting (nothing to eat or drink for a least 8 hours since midnight the evening prior   IF you received an x-ray today, you will receive an invoice from [facility]. Please contact [facility] at [phone] with questions or concerns regarding your invoice.   IF you received labwork today, you will receive an invoice from [employer]. Please contact [facility] at [phone] with questions or concerns regarding your invoice.   Our billing staff will not be able to assist you with questions regarding bills from these companies.  You will be contacted with the lab results as soon as they are available. The fastest way to get your results is to activate your My Chart account. Instructions are located on the last page of this paperwork. If you have not heard from [place] regarding the results in 2 weeks, please contact this office.    Sinusitis, Adult Sinusitis is redness, soreness, and inflammation of the paranasal sinuses.  Paranasal sinuses are air pockets within the bones of your face. They are located beneath your eyes, in the middle of your forehead, and above your eyes. In healthy paranasal sinuses, mucus is able to drain out, and air is able to circulate through them by way of your nose. However, when your paranasal sinuses are inflamed, mucus and air can become trapped. This can allow bacteria and other germs to grow and cause infection. Sinusitis can develop quickly and last only a short time (acute) or continue over a long period (chronic). Sinusitis that lasts for more than 12 weeks is considered chronic. CAUSES Causes of sinusitis include:  Allergies.  Structural abnormalities, such as displacement of the cartilage that separates your nostrils (deviated septum), which can decrease the air flow through your nose and sinuses and affect sinus drainage.  Functional abnormalities, such as when the small hairs (cilia) that line your sinuses and help remove mucus do not work properly or are not present. SIGNS AND SYMPTOMS Symptoms of acute and chronic sinusitis are the same. The primary symptoms are pain and pressure around the affected sinuses. Other symptoms include:  Upper toothache.  Earache.  Headache.  Bad breath.  Decreased sense of smell and taste.  A cough, which worsens when you are lying flat.  Fatigue.  Fever.  Thick drainage from your nose, which often is green and may contain pus (purulent).  Swelling and warmth over the affected sinuses. DIAGNOSIS Your health care provider will perform a physical exam. During your exam, your health care provider may perform any of the following to help  determine if you have acute sinusitis or chronic sinusitis:  Look in your nose for signs of abnormal growths in your nostrils (nasal polyps).  Tap over the affected sinus to check for signs of infection.  View the inside of your sinuses using an imaging device that has a light attached  (endoscope). If your health care provider suspects that you have chronic sinusitis, one or more of the following tests may be recommended:  Allergy tests.  Nasal culture. A sample of mucus is taken from your nose, sent to a lab, and screened for bacteria.  Nasal cytology. A sample of mucus is taken from your nose and examined by your health care provider to determine if your sinusitis is related to an allergy. TREATMENT Most cases of acute sinusitis are related to a viral infection and will resolve on their own within 10 days. Sometimes, medicines are prescribed to help relieve symptoms of both acute and chronic sinusitis. These may include pain medicines, decongestants, nasal steroid sprays, or saline sprays. However, for sinusitis related to a bacterial infection, your health care provider will prescribe antibiotic medicines. These are medicines that will help kill the bacteria causing the infection. Rarely, sinusitis is caused by a fungal infection. In these cases, your health care provider will prescribe antifungal medicine. For some cases of chronic sinusitis, surgery is needed. Generally, these are cases in which sinusitis recurs more than 3 times per year, despite other treatments. HOME CARE INSTRUCTIONS  Drink plenty of water. Water helps thin the mucus so your sinuses can drain more easily.  Use a humidifier.  Inhale steam 3-4 times a day (for example, sit in the bathroom with the shower running).  Apply a warm, moist washcloth to your face 3-4 times a day, or as directed by your health care provider.  Use saline nasal sprays to help moisten and clean your sinuses.  Take medicines only as directed by your health care provider.  If you were prescribed either an antibiotic or antifungal medicine, finish it all even if you start to feel better. SEEK IMMEDIATE MEDICAL CARE IF:  You have increasing pain or severe headaches.  You have nausea, vomiting, or drowsiness.  You have  swelling around your face.  You have vision problems.  You have a stiff neck.  You have difficulty breathing.   This information is not intended to replace advice given to you by your health care provider. Make sure you discuss any questions you have with your health care provider.   Document Released: 01/23/2005 Document Revised: 02/13/2014 Document Reviewed: 02/07/2011 Elsevier Interactive Patient Education 2016 [employer]. Hoarseness Hoarseness is any abnormal change in your voice.Hoarseness can make it difficult to speak. Your voice may sound raspy, breathy, or strained. Hoarseness is caused by a problem with the vocal cords. The vocal cords are two bands of tissue inside your voice box (larynx). When you speak, your vocal cords move back and forth to create sound. The surfaces of your vocal cords need to be smooth for your voice to sound clear. Swelling or lumps on the vocal cords can cause hoarseness. Common causes of vocal cord problems include:  Upper airway infection.  A long-term cough.  Straining or overusing your voice.  Smoking.  Allergies.  Vocal cord growths.  Stomach acids that flow up from your stomach and irritate your vocal cords (gastroesophageal reflux). HOME CARE INSTRUCTIONS Watch your condition for any changes. To ease any discomfort that you feel:  Rest your voice. Do not whisper. Whispering  can cause muscle strain.  Do not speak in a loud or harsh voice that makes your hoarseness worse.  Do not use any tobacco products, including cigarettes, chewing tobacco, or electronic cigarettes. If you need help quitting, ask your health care provider.  Avoid secondhand smoke.  Do not eat foods that give you heartburn. Heartburn can make gastroesophageal reflux worse.  Do not drink coffee.  Do not drink alcohol.  Drink enough fluids to keep your urine clear or pale yellow.  Use a humidifier if the air in your home is dry. SEEK MEDICAL CARE  IF:  You have hoarseness that lasts longer than 3 weeks.  You almost lose or completelylose your voice for longer than 3 days.  You have pain when you swallow or try to talk.  You feel a lump in your neck. SEEK IMMEDIATE MEDICAL CARE IF:  You have trouble swallowing.  You feel as though you are choking when you swallow.  You cough up blood or vomit blood.  You have trouble breathing.   This information is not intended to replace advice given to you by your health care provider. Make sure you discuss any questions you have with your health care provider.   Document Released: 01/06/2005 Document Revised: 06/09/2014 Document Reviewed: 01/14/2014 Elsevier Interactive Patient Education [employer].

## 2015-08-26 NOTE — Progress Notes (Signed)
Subjective:    Patient ID: Elijah Marshall, male    DOB: [birth year], 65 y.o.   MRN: [id number] Chief Complaint  Patient presents with  . Follow-up    blood pressure/labs/medication ?    HPI   Elijah Marshall is a delightful 65 yo male here to follow-up on his chronic medical condition.  Unfortunately, he was diagnosed with stage 1 non-small cell lung cancer earlier this year, is a former smoker with COPD but he has underwent a right lower lobectomy and started pulmonoary rehab. He is following closely with pulmonary.  I last saw pt 2 months prior.  He has been having some mood sxs and anxiety since the cancer diagnosis understandably so did try some prn klonopin which did help.  He was recommended to have an o/n pulse ox study through [facility].  Oncology is Dr. Julien Nordmann, pulmonology is Dr. Melvyn Novas, CT surg is Dr. Roxan Hockey  HTN: was started on lopressor 12.5 bid sev mos ago. Advised to start monitoring outside the office. HPL: On pravastatin 40 which we started 4 mos prior as total chol was 260 with LDL of 171. He did have nml LFTs after 2 mos Chronic sinusitis and rhinitis:  nasacort was causing HAs, on xyzal and singulair.  Dr. Melvyn Novas put Elijah Marshall on Augmentin x 10d which did not help and then referred Dr. Constance Holster with [facility] last wk who agreed with pt staing off the nasocort as was causing HA. Is having a lot of PND and hoarseness.  He does not feel ill but he does have congestion at night and in the morning, last night he felt it was hard to swallow due ot the PND.  He did have some sinus pressure/pain which the augmentin did help with.  He went to the dentist thinking the pain might be from his tooth but it wasn't.  He did have a normal CT scan 10/2013. Continued to have intermittent sinus sxs.  Is sniffing a lot. Tried some advil cold and sinus for sinus pressure.  Has been doing freq nasal saline but does not have a humidifier. Constipation: no benefit from miralax qd so started on  senokot S esp while on narcotics from post-op pain. GERD: On dexilant - seems better controlled with diet.  He occ has some chest tightness from gerd.   COPD: symbicort 2 puffs bid  He is scheduled for a colonoscopy  Past Medical History:  Diagnosis Date  . Allergy   . Arthritis   . Cancer [facility])    unsure at this time  . Cancer of lower lobe of right lung ([place])   . COPD (chronic obstructive pulmonary disease) ([place])    per patient's health survey - he put a ?  . Depression   . GERD (gastroesophageal reflux disease)   . Hypertension   . Non-small cell carcinoma of lung, right ([place]) 06/24/2015  . Shortness of breath   . Ulcer    Past Surgical History:  Procedure Laterality Date  . ANTERIOR FUSION LUMBAR SPINE  07/25/2012  . ANTERIOR LAT LUMBAR FUSION Left 07/25/2012   Procedure: ANTERIOR LATERAL LUMBAR FUSION 1 LEVEL;  Surgeon: [name], MD;  Location: [place];  Service: Orthopedics;  Laterality: Left;  Left sided lumbar 3-4 lateral interbody fusion with allograft and instrumentation  . COLONOSCOPY  14   polyps rem  . JOINT REPLACEMENT Right 2010  . NODE DISSECTION Right 05/24/2015   Procedure: NODE DISSECTION;  Surgeon: [name], MD;  Location[facility];  Service: Thoracic;  Laterality: Right;  . SEGMENTECOMY Right 05/24/2015   Procedure: RIGHT LOWER LOBE SUPERIOR SEGMENTECTOMY;  Surgeon: [name], MD;  Location: [place];  Service: Thoracic;  Laterality: Right;  . [place]  2012  . VIDEO ASSISTED THORACOSCOPY (VATS)/WEDGE RESECTION Right 05/24/2015   Procedure: RIGHT VIDEO ASSISTED THORACOSCOPY (VATS)/ RIGHTR LOWER LOBE WEDGE RESECTION;  Surgeon: [name], MD;  Location: [place];  Service: Thoracic;  Laterality: Right;   Current Outpatient Prescriptions on File Prior to Visit  Medication Sig Dispense Refill  . Albuterol Sulfate (PROAIR RESPICLICK) 939 (90 BASE) MCG/ACT AEPB Inhale 2 puffs into the lungs every 4 (four) hours as needed. 1  each 11  . budesonide-formoterol (SYMBICORT) 80-4.5 MCG/ACT inhaler Inhale 2 puffs into the lungs 2 (two) times daily. 1 Inhaler 5  . clonazePAM (KLONOPIN) 1 MG tablet Take 1/2 tablet as needed twice a day for anxiety and 1-2 tablets at night as needed for sleep 60 tablet 0  . dexlansoprazole (DEXILANT) 60 MG capsule Take 1 capsule (60 mg total) by mouth daily. 30 capsule 5  . HYDROcodone-acetaminophen (NORCO/VICODIN) 5-325 MG tablet Take 1-2 tablets by mouth every 6 (six) hours as needed for moderate pain. 75 tablet 0  . metoprolol tartrate (LOPRESSOR) 25 MG tablet TAKE 1/2 A TABLET BY MOUTH TWICE A DAY 30 tablet 1  . polyethylene glycol (MIRALAX) packet Take 17 g by mouth daily.    . pravastatin (PRAVACHOL) 40 MG tablet TAKE 1 TABLET BY MOUTH AT BEDTIME 90 tablet 0  . ranitidine (ZANTAC) 150 MG capsule Take 150 mg by mouth every evening. Take 2 tablets HS    . senna (SENOKOT) 8.6 MG TABS tablet Take 2 tablets (17.2 mg total) by mouth at bedtime. 120 each 0  . traMADol (ULTRAM) 50 MG tablet Take by mouth every 6 (six) hours as needed for moderate pain.     No current facility-administered medications on file prior to visit.    Allergies  Allergen Reactions  . Aspirin Nausea And Vomiting  . Morphine And Related Itching   Family History  Problem Relation Age of Onset  . Cancer Mother     colon  . Heart disease Father   . Diabetes Sister   . Hypertension Sister   . Cancer Sister   . Hypertension Brother   . Heart disease Maternal Grandmother     heart attack   Social History   Social History  . Marital status: Married    Spouse name: N/A  . Number of children: N/A  . Years of education: N/A   Occupational History  . truck driver-disability [place] History Main Topics  . Smoking status: Former Smoker    Packs/day: 1.00    Years: 35.00    Types: Cigarettes    Quit date: 09/24/2011  . Smokeless tobacco: Never Used  . Alcohol use No     Comment: weekly  .  Drug use: No  . Sexual activity: Not Asked   Other Topics Concern  . None   Social History Narrative  . None     Depression screen [facility] 2/9 08/02/2015 06/24/2015 04/15/2015 01/15/2015 10/20/2014  Decreased Interest 0 0 0 0 0  Down, Depressed, Hopeless 0 1 2 0 0  PHQ - 2 Score 0 1 2 0 0  Altered sleeping - - 2 - -  Tired, decreased energy - - 3 - -  Change in appetite - - 0 - -  Feeling bad or failure about yourself  - - 3 - -  Trouble concentrating - - 0 - -  Moving slowly or fidgety/restless - - 1 - -  Suicidal thoughts - - 1 - -  PHQ-9 Score - - 12 - -    Review of Systems  Constitutional: Positive for activity change and fatigue. Negative for fever.  HENT: Positive for congestion, postnasal drip, rhinorrhea, sinus pressure, trouble swallowing and voice change. Negative for dental problem, ear discharge, facial swelling, hearing loss, nosebleeds, sneezing, sore throat and tinnitus.   Eyes: Negative for pain, discharge, redness and itching.  Respiratory: Positive for chest tightness (from gerd). Negative for cough and shortness of breath.   Gastrointestinal: Positive for constipation. Negative for abdominal pain.  Neurological: Positive for headaches. Negative for facial asymmetry.  Psychiatric/Behavioral: Positive for sleep disturbance. Negative for dysphoric mood. The patient is not nervous/anxious.        Objective:   Physical Exam  Constitutional: He is oriented to person, place, and time. He appears well-developed and well-nourished. No distress.  HENT:  Head: Normocephalic and atraumatic.  Right Ear: External ear and ear canal normal. Tympanic membrane is retracted. A middle ear effusion is present.  Left Ear: External ear and ear canal normal. Tympanic membrane is retracted. A middle ear effusion is present.  Nose: Mucosal edema and rhinorrhea present. Right sinus exhibits maxillary sinus tenderness. Left sinus exhibits maxillary sinus tenderness.  Mouth/Throat: Uvula is  midline and mucous membranes are normal. Posterior oropharyngeal erythema present. No oropharyngeal exudate or posterior oropharyngeal edema.  Eyes: Conjunctivae are normal. Right eye exhibits no discharge. Left eye exhibits no discharge. No scleral icterus.  Neck: Normal range of motion. Neck supple. No thyromegaly present.  Cardiovascular: Normal rate, regular rhythm, normal heart sounds and intact distal pulses.   Pulmonary/Chest: Effort normal and breath sounds normal. No respiratory distress.  Lymphadenopathy:       Head (right side): Submandibular adenopathy present.       Head (left side): Submandibular adenopathy present.    He has no cervical adenopathy.       Right: No supraclavicular adenopathy present.       Left: No supraclavicular adenopathy present.  Neurological: He is alert and oriented to person, place, and time.  Skin: Skin is warm and dry. He is not diaphoretic. No erythema.  Psychiatric: He has a normal mood and affect. His behavior is normal.   Lipoma at right posterior neck directly below occiput. Well defined, soft, mobile, mildly tender     BP 124/80   Pulse 90   Temp 97.9 F (36.6 C)   Resp 18   Ht '5\' 9"'$  (1.753 m)   Wt 175 lb (79.4 kg)   SpO2 95%   BMI 25.84 kg/m    Assessment & Plan:   1. Essential hypertension   2. Hyperlipidemia   3. Chronic sinusitis, unspecified location   4. Chronic rhinitis   5. Gastroesophageal reflux disease, esophagitis presence not specified   6. COPD GOLD II    7. Non-small cell carcinoma of lung, right ([place])   8. Hoarseness of voice - saw ENT prior who thought that his nasal and throat irritation were due to dryness from the nasal steroid and antihistamines so instructed him to stop. Sxs have not improved and now in office today pt is constantly sniffing - it almost seems more like a nervous habit but pt feels like he his having constant rhinitis. Will have him stay off the  nasal steroid since ENT thought that was etiology  but as his sinus sxs are worsening, will restart xyzal and treat for sinus infection.  If sxs persist, consider return to ENT, repeating the sinus imaging, or second opinion.  Satart home cool mist humidifier and increase nasal saline use to q1-2 hrs rather than qd.    Orders Placed This Encounter  Procedures  . Lipid panel    Order Specific Question:   Has the patient fasted?    Answer:   Yes  . CBC  . Comprehensive metabolic panel    Order Specific Question:   Has the patient fasted?    Answer:   Yes  . Lipid panel    Standing Status:   Future    Standing Expiration Date:   08/25/2016    Order Specific Question:   Has the patient fasted?    Answer:   Yes  . POCT CBC    Meds ordered this encounter  Medications  . montelukast (SINGULAIR) 10 MG tablet    Sig: Take 1 tablet (10 mg total) by mouth at bedtime.    Dispense:  90 tablet    Refill:  3  . levocetirizine (XYZAL) 5 MG tablet    Sig: Take 1 tablet (5 mg total) by mouth every evening.    Dispense:  30 tablet    Refill:  11  . Guaifenesin (MUCINEX MAXIMUM STRENGTH) 1200 MG TB12    Sig: Take 1 tablet (1,200 mg total) by mouth every 12 (twelve) hours as needed.    Dispense:  14 tablet    Refill:  1  . predniSONE (DELTASONE) 20 MG tablet    Sig: Take 3 tabs qd x 3d, then 2 tabs qd x 3d then 1 tab qd x 3d.    Dispense:  18 tablet    Refill:  0    [name], M.D.  [facility] [address] [place], [place] [address] [phone] phone [phone] fax  08/30/15 11:59 AM

## 2015-08-27 ENCOUNTER — Encounter: Payer: Self-pay | Admitting: Family Medicine

## 2015-08-27 LAB — LIPID PANEL
Cholesterol: 215 mg/dL — ABNORMAL HIGH (ref 125–200)
HDL: 64 mg/dL (ref >=40)
LDL Cholesterol: 124 mg/dL (ref <130)
Total CHOL/HDL Ratio: 3.4 Ratio (ref <=5.0)
Triglycerides: 134 mg/dL (ref <150)
VLDL: 27 mg/dL (ref <30)

## 2015-08-27 LAB — COMPREHENSIVE METABOLIC PANEL
ALT: 39 U/L (ref 9–46)
AST: 28 U/L (ref 10–35)
Albumin: 5 g/dL (ref 3.6–5.1)
Alkaline Phosphatase: 94 U/L (ref 40–115)
BUN: 7 mg/dL (ref 7–25)
CO2: 24 mmol/L (ref 20–31)
Calcium: 10.4 mg/dL — ABNORMAL HIGH (ref 8.6–10.3)
Chloride: 102 mmol/L (ref 98–110)
Creat: 1.03 mg/dL (ref 0.70–1.25)
Glucose, Bld: 85 mg/dL (ref 65–99)
Potassium: 4.7 mmol/L (ref 3.5–5.3)
Sodium: 141 mmol/L (ref 135–146)
Total Bilirubin: 0.6 mg/dL (ref 0.2–1.2)
Total Protein: 8.1 g/dL (ref 6.1–8.1)

## 2015-08-27 LAB — CBC
HCT: 50.8 % — ABNORMAL HIGH (ref 38.5–50.0)
Hemoglobin: 17.3 g/dL — ABNORMAL HIGH (ref 13.2–17.1)
MCH: 29.7 pg (ref 27.0–33.0)
MCHC: 34.1 g/dL (ref 32.0–36.0)
MCV: 87.1 fL (ref 80.0–100.0)
MPV: 9.5 fL (ref 7.5–12.5)
Platelets: 292 10*3/uL (ref 140–400)
RBC: 5.83 MIL/uL — ABNORMAL HIGH (ref 4.20–5.80)
RDW: 14.3 % (ref 11.0–15.0)
WBC: 5.4 10*3/uL (ref 3.8–10.8)

## 2015-08-31 ENCOUNTER — Encounter (HOSPITAL_COMMUNITY)
Admission: RE | Admit: 2015-08-31 | Discharge: 2015-08-31 | Disposition: A | Discharge status: Home / Self Care | Payer: Medicare Other | Source: Ambulatory Visit | Attending: Thoracic Surgery (Cardiothoracic Vascular Surgery) | Admitting: Thoracic Surgery (Cardiothoracic Vascular Surgery)

## 2015-08-31 VITALS — Wt 177.7 lb

## 2015-08-31 DIAGNOSIS — J449 Chronic obstructive pulmonary disease, unspecified: Secondary | ICD-10-CM | POA: Diagnosis not present

## 2015-08-31 DIAGNOSIS — C3431 Malignant neoplasm of lower lobe, right bronchus or lung: Secondary | ICD-10-CM

## 2015-08-31 NOTE — Progress Notes (Signed)
Daily Session Note  Patient Details  Name: Elijah Marshall MRN: [id number] Date of Birth: 06/04/1950 Referring Provider:   April Manson Pulmonary Rehab Walk Test from 08/05/2015 in [place]  Referring Provider  Dr. Roxan Hockey      Encounter Date: 08/31/2015  Check In:     Session Check In - 08/31/15 1524      Check-In   Location [facility]   Staff Present Rosebud Poles, RN, BSN;Molly diVincenzo, MS, ACSM RCEP, Exercise Physiologist;Lisa Ysidro Evert, RN;Portia Rollene Rotunda, RN, BSN   Supervising physician immediately available to respond to emergencies [facility] immediately available   Physician(s) Dr. Marily Memos   Medication changes reported     No   Fall or balance concerns reported    No   Warm-up and Cool-down Performed as group-led instruction   Resistance Training Performed Yes   VAD Patient? No     Pain Assessment   Currently in Pain? No/denies   Multiple Pain Sites No      Capillary Blood Glucose: No results found for this or any previous visit (from the past 24 hour(s)).      Exercise Prescription Changes - 08/31/15 1500      Exercise Review   Progression Yes     Response to Exercise   Blood Pressure (Admit) 142/72   Blood Pressure (Exercise) 142/70   Blood Pressure (Exit) 110/70   Heart Rate (Admit) 104 bpm   Heart Rate (Exercise) 124 bpm   Heart Rate (Exit) 108 bpm   Oxygen Saturation (Admit) 96 %   Oxygen Saturation (Exercise) 90 %   Oxygen Saturation (Exit) 95 %   Rating of Perceived Exertion (Exercise) 13   Perceived Dyspnea (Exercise) 1   Duration Progress to 45 minutes of aerobic exercise without signs/symptoms of physical distress   Intensity THRR unchanged     Progression   Progression Continue to progress workloads to maintain intensity without signs/symptoms of physical distress.     Treadmill   MPH 2.5   Grade 3   Minutes 17     Bike   Level 1   Minutes 17     NuStep   Level 4   Minutes 17     Goals Met:  Exercise tolerated well Strength training completed today  Goals Unmet:  Not Applicable  Comments: Service time is from 1330 to 1510    Dr. Rush Farmer is Medical Director for Pulmonary Rehab at [facility].

## 2015-09-02 ENCOUNTER — Encounter (HOSPITAL_COMMUNITY)
Admission: RE | Admit: 2015-09-02 | Discharge: 2015-09-02 | Disposition: A | Discharge status: Home / Self Care | Payer: Medicare Other | Source: Ambulatory Visit | Attending: Thoracic Surgery (Cardiothoracic Vascular Surgery) | Admitting: Thoracic Surgery (Cardiothoracic Vascular Surgery)

## 2015-09-02 VITALS — Wt 179.5 lb

## 2015-09-02 DIAGNOSIS — C3431 Malignant neoplasm of lower lobe, right bronchus or lung: Secondary | ICD-10-CM

## 2015-09-02 DIAGNOSIS — J449 Chronic obstructive pulmonary disease, unspecified: Secondary | ICD-10-CM | POA: Diagnosis not present

## 2015-09-02 NOTE — Progress Notes (Signed)
Daily Session Note  Patient Details  Name: Elijah Marshall MRN: [id number] Date of Birth: 1950/10/31 Referring Provider:   April Manson Pulmonary Rehab Walk Test from 08/05/2015 in [place]  Referring Provider  Dr. Roxan Hockey      Encounter Date: 09/02/2015  Check In:     Session Check In - 09/02/15 1344      Check-In   Location [facility]   Staff Present Rosebud Poles, RN, BSN;Molly diVincenzo, MS, ACSM RCEP, Exercise Physiologist;Annedrea Rosezella [place], RN, MHA;Ahmani Daoud Rollene Rotunda, RN, BSN   Supervising physician immediately available to respond to emergencies [facility] immediately available   Physician(s) Dr. Waldron Labs   Medication changes reported     No   Fall or balance concerns reported    No   Warm-up and Cool-down Performed as group-led instruction   Resistance Training Performed Yes   VAD Patient? No     Pain Assessment   Currently in Pain? No/denies   Multiple Pain Sites No      Capillary Blood Glucose: No results found for this or any previous visit (from the past 24 hour(s)).      Exercise Prescription Changes - 09/02/15 1528      Response to Exercise   Blood Pressure (Admit) 154/60   Blood Pressure (Exercise) 174/80   Blood Pressure (Exit) 134/80   Heart Rate (Admit) 98 bpm   Heart Rate (Exercise) 120 bpm   Heart Rate (Exit) 108 bpm   Oxygen Saturation (Admit) 95 %   Oxygen Saturation (Exercise) 87 %   Oxygen Saturation (Exit) 93 %   Rating of Perceived Exertion (Exercise) 13   Perceived Dyspnea (Exercise) 1   Duration Progress to 45 minutes of aerobic exercise without signs/symptoms of physical distress   Intensity THRR unchanged     Progression   Progression Continue to progress workloads to maintain intensity without signs/symptoms of physical distress.     Resistance Training   Training Prescription Yes   Weight blue bands   Reps 10-12  10 min strength training     Bike   Level 1   Minutes 17     NuStep   Level 4   Minutes 17   METs 3.1     Goals Met:  Improved SOB with ADL's Using PLB without cueing & demonstrates good technique Exercise tolerated well No report of cardiac concerns or symptoms Strength training completed today  Goals Unmet:  Not Applicable  Comments: Service time is from 1330 to 1505   Dr. Rush Farmer is Medical Director for Pulmonary Rehab at [facility].

## 2015-09-07 ENCOUNTER — Encounter (HOSPITAL_COMMUNITY)
Admission: RE | Admit: 2015-09-07 | Discharge: 2015-09-07 | Disposition: A | Discharge status: Home / Self Care | Payer: Commercial Managed Care - HMO | Source: Ambulatory Visit | Attending: Thoracic Surgery (Cardiothoracic Vascular Surgery) | Admitting: Thoracic Surgery (Cardiothoracic Vascular Surgery)

## 2015-09-07 VITALS — Wt 179.7 lb

## 2015-09-07 DIAGNOSIS — Z79899 Other long term (current) drug therapy: Secondary | ICD-10-CM | POA: Diagnosis not present

## 2015-09-07 DIAGNOSIS — F329 Major depressive disorder, single episode, unspecified: Secondary | ICD-10-CM | POA: Diagnosis not present

## 2015-09-07 DIAGNOSIS — Z7951 Long term (current) use of inhaled steroids: Secondary | ICD-10-CM | POA: Insufficient documentation

## 2015-09-07 DIAGNOSIS — J449 Chronic obstructive pulmonary disease, unspecified: Secondary | ICD-10-CM | POA: Insufficient documentation

## 2015-09-07 DIAGNOSIS — C3431 Malignant neoplasm of lower lobe, right bronchus or lung: Secondary | ICD-10-CM | POA: Diagnosis not present

## 2015-09-07 DIAGNOSIS — Z87891 Personal history of nicotine dependence: Secondary | ICD-10-CM | POA: Diagnosis not present

## 2015-09-07 DIAGNOSIS — I10 Essential (primary) hypertension: Secondary | ICD-10-CM | POA: Insufficient documentation

## 2015-09-07 DIAGNOSIS — K219 Gastro-esophageal reflux disease without esophagitis: Secondary | ICD-10-CM | POA: Insufficient documentation

## 2015-09-07 DIAGNOSIS — Z7952 Long term (current) use of systemic steroids: Secondary | ICD-10-CM | POA: Insufficient documentation

## 2015-09-07 DIAGNOSIS — M1990 Unspecified osteoarthritis, unspecified site: Secondary | ICD-10-CM | POA: Diagnosis not present

## 2015-09-07 NOTE — Progress Notes (Signed)
Daily Session Note  Patient Details  Name: Elijah Marshall MRN: [id number] Date of Birth: [birth year] Referring Provider:   April Manson Pulmonary Rehab Walk Test from 08/05/2015 in [place]  Referring Provider  Dr. Roxan Hockey      Encounter Date: 09/07/2015  Check In:     Session Check In - 09/07/15 1334      Check-In   [facility]   Staff Present Su Hilt, MS, ACSM RCEP, Exercise Physiologist;Portia Rollene Rotunda, RN, Maxcine Ham, RN, Roque Cash, RN   Supervising physician immediately available to respond to emergencies [facility] immediately available   Physician(s) Dr. Marily Memos   Medication changes reported     No   Fall or balance concerns reported    No   Warm-up and Cool-down Performed as group-led instruction   Resistance Training Performed Yes   VAD Patient? No     Pain Assessment   Currently in Pain? No/denies   Multiple Pain Sites No      Capillary Blood Glucose: No results found for this or any previous visit (from the past 24 hour(s)).      Exercise Prescription Changes - 09/07/15 1500      Response to Exercise   Blood Pressure (Admit) 122/70   Blood Pressure (Exercise) 174/100   Blood Pressure (Exit) 122/70   Heart Rate (Admit) 90 bpm   Heart Rate (Exercise) 106 bpm   Heart Rate (Exit) 90 bpm   Oxygen Saturation (Admit) 94 %   Oxygen Saturation (Exercise) 87 %  desaturated to 86-87% on treadmill, decreased level, PLB   Oxygen Saturation (Exit) 97 %   Rating of Perceived Exertion (Exercise) 13   Perceived Dyspnea (Exercise) 1   Duration Progress to 45 minutes of aerobic exercise without signs/symptoms of physical distress   Intensity THRR unchanged     Progression   Progression Continue to progress workloads to maintain intensity without signs/symptoms of physical distress.     Resistance Training   Training Prescription Yes   Weight blue bands   Reps 10-12  10  minutes of strength training     Interval Training   Interval Training No     Treadmill   MPH 2   Grade 1   Minutes 17     NuStep   Level 4   Minutes 17   METs 2     Goals Met:  Strength training completed today  Goals Unmet:  O2 Sat  Comments: Service time is from 1330 to 1500    Dr. Rush Farmer is Medical Director for Pulmonary Rehab at [facility].

## 2015-09-07 NOTE — Progress Notes (Signed)
Elijah Marshall 65 y.o. male Nutrition Note Spoke with pt. Pt is overweight. There are some ways the pt can make his eating habits healthier.  Pt's Rate Your Plate results reviewed with pt. Pt does not avoid salty food; uses canned/ convenience food.  Pt does not add salt to food.  The role of sodium in lung disease reviewed with pt. Pt expressed understanding of the information reviewed via feedback method.  No results found for: HGBA1C  Nutrition Diagnosis ? Food-and nutrition-related knowledge deficit related to lack of exposure to information as related to diagnosis of pulmonary disease Nutrition Intervention ? Pt's individual nutrition plan and goals reviewed with pt. ? Benefits of adopting healthy eating habits discussed when pt's Rate Your Plate reviewed. ? Pt to attend the Nutrition and Lung Disease class ? Continual client-centered nutrition education by RD, as part of interdisciplinary care. Goal(s) 1. Describe the benefit of including fruits, vegetables, whole grains, and low-fat dairy products in a healthy meal plan. Monitor and Evaluate progress toward nutrition goal with team.   Derek Mound, M.Ed, RD, LDN, CDE 09/07/2015 3:18 PM

## 2015-09-07 NOTE — Progress Notes (Signed)
I have reviewed a Home Exercise Prescription with Elijah Marshall . [name] is currently exercising at home.  The patient was advised to walk 3 days a week for 45 minutes.  [name] and I discussed how to progress their exercise prescription.  The patient stated that their goals were to be less short of breath and be able to give back to the community.  The patient stated that they understand the exercise prescription.  We reviewed exercise guidelines, target heart rate during exercise, oxygen use, weather, home pulse oximeter, endpoints for exercise, and goals.  Patient is encouraged to come to me with any questions. I will continue to follow up with the patient to assist them with progression and safety.

## 2015-09-09 ENCOUNTER — Encounter (HOSPITAL_COMMUNITY)
Admission: RE | Admit: 2015-09-09 | Discharge: 2015-09-09 | Disposition: A | Discharge status: Home / Self Care | Payer: Commercial Managed Care - HMO | Source: Ambulatory Visit | Attending: Thoracic Surgery (Cardiothoracic Vascular Surgery) | Admitting: Thoracic Surgery (Cardiothoracic Vascular Surgery)

## 2015-09-09 VITALS — Wt 177.9 lb

## 2015-09-09 DIAGNOSIS — J449 Chronic obstructive pulmonary disease, unspecified: Secondary | ICD-10-CM | POA: Diagnosis not present

## 2015-09-09 DIAGNOSIS — C3431 Malignant neoplasm of lower lobe, right bronchus or lung: Secondary | ICD-10-CM

## 2015-09-09 NOTE — Progress Notes (Signed)
Daily Session Note  Patient Details  Name: Elijah Marshall MRN: [id number] Date of Birth: [birth year] Referring Provider:   April Manson Pulmonary Rehab Walk Test from 08/05/2015 in [place]  Referring Provider  Dr. Roxan Hockey      Encounter Date: 09/09/2015  Check In:     Session Check In - 09/09/15 1348      Check-In   Location [facility]   Staff Present Rosebud Poles, RN, BSN;Molly diVincenzo, MS, ACSM RCEP, Exercise Physiologist;Portia Rollene Rotunda, RN, BSN   Supervising physician immediately available to respond to emergencies [facility] immediately available   Physician(s) Dr. Marily Memos   Medication changes reported     No   Fall or balance concerns reported    No   Warm-up and Cool-down Performed as group-led instruction   Resistance Training Performed Yes   VAD Patient? No     Pain Assessment   Currently in Pain? No/denies   Multiple Pain Sites No      Capillary Blood Glucose: No results found for this or any previous visit (from the past 24 hour(s)).      Exercise Prescription Changes - 09/09/15 1500      Response to Exercise   Blood Pressure (Admit) 118/64   Blood Pressure (Exercise) 140/68   Blood Pressure (Exit) 124/76   Heart Rate (Admit) 97 bpm   Heart Rate (Exercise) 121 bpm   Heart Rate (Exit) 104 bpm   Oxygen Saturation (Admit) 95 %   Oxygen Saturation (Exercise) 90 %   Oxygen Saturation (Exit) 97 %   Rating of Perceived Exertion (Exercise) 13   Perceived Dyspnea (Exercise) 1   Duration Progress to 45 minutes of aerobic exercise without signs/symptoms of physical distress   Intensity THRR unchanged     Progression   Progression Continue to progress workloads to maintain intensity without signs/symptoms of physical distress.     Resistance Training   Training Prescription Yes   Weight blue bands   Reps 10-12  10 minutes of strength training     Interval Training   Interval Training No      Treadmill   MPH 2.5  tolerated speed better, did not desaturate today   Grade 3   Minutes 17     NuStep   Level 4   Minutes 17   METs 2.9     Goals Met:  Exercise tolerated well Strength training completed today  Goals Unmet:  Not Applicable  Comments: Service time is from 1330 to 1510. Attended the meditation class today.      Dr. Rush Farmer is Medical Director for Pulmonary Rehab at [facility].

## 2015-09-09 NOTE — Progress Notes (Signed)
.  qPt completed Quality of Life survey as a participant in Pulmonary Rehab. Scores 21.0 or below are considered low. Pt score very low in several areas Overall 19.50, Health and Function 16.23, physiological and spiritual 17.44, family 22.50.  Elijah Marshall is recovering from a VATS procedure, he is trying to build his strength and stamina back.  He is hopeful that when he can get back to his normal activities his QOL will improve.  His family is very supportive and he is a very postivie person.

## 2015-09-10 ENCOUNTER — Telehealth: Payer: Self-pay

## 2015-09-10 NOTE — Telephone Encounter (Signed)
Received records from [facility] on 09/10/15--3 pages total. Will label and place in Dr Raul Del box.

## 2015-09-14 ENCOUNTER — Encounter (HOSPITAL_COMMUNITY)
Admission: RE | Admit: 2015-09-14 | Discharge: 2015-09-14 | Disposition: A | Discharge status: Home / Self Care | Payer: Commercial Managed Care - HMO | Source: Ambulatory Visit | Attending: Thoracic Surgery (Cardiothoracic Vascular Surgery) | Admitting: Thoracic Surgery (Cardiothoracic Vascular Surgery)

## 2015-09-14 DIAGNOSIS — C3431 Malignant neoplasm of lower lobe, right bronchus or lung: Secondary | ICD-10-CM

## 2015-09-16 ENCOUNTER — Encounter (HOSPITAL_COMMUNITY)
Admission: RE | Admit: 2015-09-16 | Discharge: 2015-09-16 | Disposition: A | Discharge status: Home / Self Care | Payer: Commercial Managed Care - HMO | Source: Ambulatory Visit | Attending: Thoracic Surgery (Cardiothoracic Vascular Surgery) | Admitting: Thoracic Surgery (Cardiothoracic Vascular Surgery)

## 2015-09-16 VITALS — Wt 175.0 lb

## 2015-09-16 DIAGNOSIS — C3431 Malignant neoplasm of lower lobe, right bronchus or lung: Secondary | ICD-10-CM

## 2015-09-16 DIAGNOSIS — J449 Chronic obstructive pulmonary disease, unspecified: Secondary | ICD-10-CM | POA: Diagnosis not present

## 2015-09-16 NOTE — Progress Notes (Signed)
Daily Session Note  Patient Details  Name: Elijah Marshall MRN: [id number] Date of Birth: 05-Feb-1951 Referring Provider:   April Manson Pulmonary Rehab Walk Test from 08/05/2015 in [place]  Referring Provider  Dr. Roxan Hockey      Encounter Date: 09/16/2015  Check In:     Session Check In - 09/16/15 1407      Check-In   Location [facility]   Staff Present Rosebud Poles, RN, BSN;Molly diVincenzo, MS, ACSM RCEP, Exercise Physiologist;Lisa Ysidro Evert, RN   Supervising physician immediately available to respond to emergencies [facility] immediately available   Physician(s) Dr. Marthenia Rolling   Medication changes reported     No   Fall or balance concerns reported    No   Warm-up and Cool-down Performed as group-led instruction   Resistance Training Performed Yes   VAD Patient? No     Pain Assessment   Currently in Pain? No/denies   Multiple Pain Sites No      Capillary Blood Glucose: No results found for this or any previous visit (from the past 24 hour(s)).      Exercise Prescription Changes - 09/16/15 1600      Response to Exercise   Blood Pressure (Admit) 138/72   Blood Pressure (Exercise) 158/70   Blood Pressure (Exit) 132/76   Heart Rate (Admit) 100 bpm   Heart Rate (Exercise) 136 bpm   Heart Rate (Exit) 106 bpm   Oxygen Saturation (Admit) 96 %   Oxygen Saturation (Exercise) 88 %  returned to 90 with rest quickly, will place on 2l of oxygen   Oxygen Saturation (Exit) 97 %   Rating of Perceived Exertion (Exercise) 13   Perceived Dyspnea (Exercise) 1   Duration Progress to 45 minutes of aerobic exercise without signs/symptoms of physical distress   Intensity THRR unchanged     Progression   Progression Continue to progress workloads to maintain intensity without signs/symptoms of physical distress.     Resistance Training   Training Prescription Yes   Weight blue bands   Reps 10-12  10 minutes of strength  training     Interval Training   Interval Training No     Treadmill   MPH 2.5   Grade 3   Minutes 17     Bike   Level 1   Minutes 17     Goals Met:  Strength training completed today  Goals Unmet:  O2 Sat  Comments: Heart rate was 10 beats over THR on treadmill, will add 2l of oxygen next exercise session   Dr. Rush Farmer is Medical Director for Pulmonary Rehab at [facility].

## 2015-09-16 NOTE — Progress Notes (Signed)
Pulmonary Individual Treatment Plan  Patient Details  Name: VONTE ROSSIN MRN: [id number] Date of Birth: [birth year] Referring Provider:   April Manson Pulmonary Rehab Walk Test from 08/05/2015 in [place]  Referring Provider  Dr. Roxan Hockey      Initial Encounter Date:  Flowsheet Row Pulmonary Rehab Walk Test from 08/05/2015 in [place]  Date  08/05/15  Referring Provider  Dr. Roxan Hockey      Visit Diagnosis: Malignant neoplasm of lower lobe of right lung [facility])  Patient's Home Medications on Admission:   Current Outpatient Prescriptions:  .  Albuterol Sulfate (PROAIR RESPICLICK) 854 (90 BASE) MCG/ACT AEPB, Inhale 2 puffs into the lungs every 4 (four) hours as needed., Disp: 1 each, Rfl: 11 .  budesonide-formoterol (SYMBICORT) 80-4.5 MCG/ACT inhaler, Inhale 2 puffs into the lungs 2 (two) times daily., Disp: 1 Inhaler, Rfl: 5 .  clonazePAM (KLONOPIN) 1 MG tablet, Take 1/2 tablet as needed twice a day for anxiety and 1-2 tablets at night as needed for sleep, Disp: 60 tablet, Rfl: 0 .  dexlansoprazole (DEXILANT) 60 MG capsule, Take 1 capsule (60 mg total) by mouth daily., Disp: 30 capsule, Rfl: 5 .  Guaifenesin (MUCINEX MAXIMUM STRENGTH) 1200 MG TB12, Take 1 tablet (1,200 mg total) by mouth every 12 (twelve) hours as needed., Disp: 14 tablet, Rfl: 1 .  HYDROcodone-acetaminophen (NORCO/VICODIN) 5-325 MG tablet, Take 1-2 tablets by mouth every 6 (six) hours as needed for moderate pain., Disp: 75 tablet, Rfl: 0 .  levocetirizine (XYZAL) 5 MG tablet, Take 1 tablet (5 mg total) by mouth every evening., Disp: 30 tablet, Rfl: 11 .  metoprolol tartrate (LOPRESSOR) 25 MG tablet, TAKE 1/2 A TABLET BY MOUTH TWICE A DAY, Disp: 30 tablet, Rfl: 1 .  montelukast (SINGULAIR) 10 MG tablet, Take 1 tablet (10 mg total) by mouth at bedtime., Disp: 90 tablet, Rfl: 3 .  polyethylene glycol (MIRALAX) packet, Take 17 g by mouth daily., Disp: ,  Rfl:  .  pravastatin (PRAVACHOL) 40 MG tablet, TAKE 1 TABLET BY MOUTH AT BEDTIME, Disp: 90 tablet, Rfl: 0 .  predniSONE (DELTASONE) 20 MG tablet, Take 3 tabs qd x 3d, then 2 tabs qd x 3d then 1 tab qd x 3d., Disp: 18 tablet, Rfl: 0 .  ranitidine (ZANTAC) 150 MG capsule, Take 150 mg by mouth every evening. Take 2 tablets HS, Disp: , Rfl:  .  senna (SENOKOT) 8.6 MG TABS tablet, Take 2 tablets (17.2 mg total) by mouth at bedtime., Disp: 120 each, Rfl: 0 .  traMADol (ULTRAM) 50 MG tablet, Take by mouth every 6 (six) hours as needed for moderate pain., Disp: , Rfl:   Past Medical History: Past Medical History:  Diagnosis Date  . Allergy   . Arthritis   . Cancer [facility])    unsure at this time  . Cancer of lower lobe of right lung ([place])   . COPD (chronic obstructive pulmonary disease) ([place])    per patient's health survey - he put a ?  . Depression   . GERD (gastroesophageal reflux disease)   . Hypertension   . Non-small cell carcinoma of lung, right ([place]) 06/24/2015  . Shortness of breath   . Ulcer     Tobacco Use: History  Smoking Status  . Former Smoker  . Packs/day: 1.00  . Years: 35.00  . Types: Cigarettes  . Quit date: 09/24/2011  Smokeless Tobacco  . Never Used    Labs: Recent Review Flowsheet  Data    Labs for ITP Cardiac and Pulmonary Rehab Latest Ref Rng & Units 04/15/2015 05/20/2015 05/25/2015 08/26/2015   Cholestrol 125 - 200 mg/dL 260(H) - - 215(H)   LDLCALC <130 mg/dL 171(H) - - 124   HDL >=40 mg/dL 67 - - 64   Trlycerides <150 mg/dL 108 - - 134   PHART 7.350 - 7.450 - 7.414 7.393 -   PCO2ART 35.0 - 45.0 mmHg - 39.8 38.5 -   HCO3 20.0 - 24.0 mEq/L - 25.0(H) 23.0 -   TCO2 0 - 100 mmol/L - 26.2 24.1 -   ACIDBASEDEF 0.0 - 2.0 mmol/L - - 1.3 -   O2SAT % - 94.1 94.7 -      Capillary Blood Glucose: Lab Results  Component Value Date   GLUCAP 76 05/25/2015   GLUCAP 115 (H) 05/25/2015   GLUCAP 83 05/25/2015   GLUCAP 96 05/24/2015   GLUCAP 98 05/24/2015     ADL  UCSD:     Pulmonary Assessment Scores    Row Name 08/03/15 1504         ADL UCSD   ADL Phase Entry     SOB Score total 39        Pulmonary Function Assessment:     Pulmonary Function Assessment - 08/02/15 1025      Breath   Bilateral Breath Sounds Clear   Shortness of Breath No      Exercise Target Goals:    Exercise Program Goal: Individual exercise prescription set with THRR, safety & activity barriers. Participant demonstrates ability to understand and report RPE using BORG scale, to self-measure pulse accurately, and to acknowledge the importance of the exercise prescription.  Exercise Prescription Goal: Starting with aerobic activity 30 plus minutes a day, 3 days per week for initial exercise prescription. Provide home exercise prescription and guidelines that participant acknowledges understanding prior to discharge.  Activity Barriers & Risk Stratification:     Activity Barriers & Cardiac Risk Stratification - 08/02/15 1023      Activity Barriers & Cardiac Risk Stratification   Activity Barriers Arthritis;Back Problems;Deconditioning;Muscular Weakness;Shortness of Breath;Incisional Pain;Right Knee Replacement      6 Minute Walk:     6 Minute Walk    Row Name 08/05/15 1646         6 Minute Walk   Phase Initial     Distance 1410 feet     Walk Time 6 minutes     # of Rest Breaks 0     MPH 2.67     METS 2.99     RPE 13     Perceived Dyspnea  1     Symptoms No     Resting HR 121 bpm     Resting BP 150/88     Max Ex. HR 121 bpm     Max Ex. BP 178/84     2 Minute Post BP 148/84       Interval HR   Baseline HR 89     1 Minute HR 97     2 Minute HR 94     3 Minute HR 89     4 Minute HR 88     5 Minute HR 87     6 Minute HR 87     2 Minute Post HR 96     Interval Heart Rate? Yes       Interval Oxygen   Baseline Oxygen Saturation % 96 %     Baseline Liters  of Oxygen 0 L     1 Minute Oxygen Saturation % 97 %     1 Minute Liters of Oxygen 0  L     2 Minute Oxygen Saturation % 94 %     2 Minute Liters of Oxygen 0 L     3 Minute Oxygen Saturation % 89 %     3 Minute Liters of Oxygen 0 L     4 Minute Oxygen Saturation % 88 %     4 Minute Liters of Oxygen 0 L     5 Minute Oxygen Saturation % 87 %     5 Minute Liters of Oxygen 0 L     6 Minute Oxygen Saturation % 87 %     6 Minute Liters of Oxygen 0 L     2 Minute Post Oxygen Saturation % 96 %     2 Minute Post Liters of Oxygen 0 L        Initial Exercise Prescription:     Initial Exercise Prescription - 08/05/15 1600      Date of Initial Exercise RX and Referring Provider   Date 08/05/15   Referring Provider Dr. Roxan Hockey     Treadmill   MPH 1.7   Grade 0   Minutes 17     NuStep   Level 2   Minutes 17   METs 1.5     Elliptical   Level 2   Speed 1   Minutes 17     Prescription Details   Frequency (times per week) 2   Duration Progress to 45 minutes of aerobic exercise without signs/symptoms of physical distress     Intensity   THRR 40-80% of Max Heartrate 62-125   Ratings of Perceived Exertion 11-13   Perceived Dyspnea 0-4     Progression   Progression Continue progressive overload as per policy without signs/symptoms or physical distress.     Resistance Training   Training Prescription Yes   Weight blue bands   Reps 10-12      Perform Capillary Blood Glucose checks as needed.  Exercise Prescription Changes:     Exercise Prescription Changes    Row Name 08/12/15 1500 08/17/15 1700 08/19/15 1600 08/24/15 1500 08/31/15 1500     Exercise Review   Progression  -  - Yes Yes Yes     Response to Exercise   Blood Pressure (Admit) 144/80 132/64 122/76 128/70 142/72   Blood Pressure (Exercise) 144/80 142/72 156/80 142/76 142/70   Blood Pressure (Exit) 116/60 132/80 120/72 118/84 110/70   Heart Rate (Admit) 90 bpm 97 bpm 87 bpm 92 bpm 104 bpm   Heart Rate (Exercise) 110 bpm 124 bpm 107 bpm 103 bpm 124 bpm   Heart Rate (Exit) 95 bpm 109 bpm 92  bpm 98 bpm 108 bpm   Oxygen Saturation (Admit) 97 % 96 % 95 % 95 % 96 %   Oxygen Saturation (Exercise) 91 % 94 % 91 % 91 % 90 %   Oxygen Saturation (Exit) 95 % 95 % 94 % 95 % 95 %   Rating of Perceived Exertion (Exercise) '13 13 11 13 13   '$ Perceived Dyspnea (Exercise) '1 1 1 2 1   '$ Duration Progress to 45 minutes of aerobic exercise without signs/symptoms of physical distress Progress to 45 minutes of aerobic exercise without signs/symptoms of physical distress Progress to 45 minutes of aerobic exercise without signs/symptoms of physical distress Progress to 45 minutes of aerobic exercise without signs/symptoms of  physical distress Progress to 45 minutes of aerobic exercise without signs/symptoms of physical distress   Intensity THRR unchanged THRR unchanged THRR New THRR unchanged THRR unchanged     Progression   Progression Continue to progress workloads to maintain intensity without signs/symptoms of physical distress. Continue to progress workloads to maintain intensity without signs/symptoms of physical distress. Continue to progress workloads to maintain intensity without signs/symptoms of physical distress. Continue to progress workloads to maintain intensity without signs/symptoms of physical distress. Continue to progress workloads to maintain intensity without signs/symptoms of physical distress.     Resistance Training   Training Prescription Yes Yes Yes Yes  -   Weight blue bands blue bands blue bands blue bands  -   Reps 10-12 10-12 10-12 10-12  10 minutes of strength training  -     Interval Training   Interval Training No No No No  -     Treadmill   MPH '2 2 2 2 '$ 2.5   Grade 0 0 0 0 3   Minutes '17 17 17 17 17     '$ Bike   Level  - 0.5  - 1 1   Minutes  - 17  - 17 17     NuStep   Level '2 2 3 4 4   '$ Minutes '17 17 17 17 17   '$ METs 2 2.6 2.5 3  -   Row Name 09/02/15 1528 09/07/15 1500 09/09/15 1500         Response to Exercise   Blood Pressure (Admit) 154/60 122/70 118/64      Blood Pressure (Exercise) 174/80 174/100 140/68     Blood Pressure (Exit) 134/80 122/70 124/76     Heart Rate (Admit) 98 bpm 90 bpm 97 bpm     Heart Rate (Exercise) 120 bpm 106 bpm 121 bpm     Heart Rate (Exit) 108 bpm 90 bpm 104 bpm     Oxygen Saturation (Admit) 95 % 94 % 95 %     Oxygen Saturation (Exercise) 87 % 87 %  desaturated to 86-87% on treadmill, decreased level, PLB 90 %     Oxygen Saturation (Exit) 93 % 97 % 97 %     Rating of Perceived Exertion (Exercise) '13 13 13     '$ Perceived Dyspnea (Exercise) '1 1 1     '$ Duration Progress to 45 minutes of aerobic exercise without signs/symptoms of physical distress Progress to 45 minutes of aerobic exercise without signs/symptoms of physical distress Progress to 45 minutes of aerobic exercise without signs/symptoms of physical distress     Intensity THRR unchanged THRR unchanged THRR unchanged       Progression   Progression Continue to progress workloads to maintain intensity without signs/symptoms of physical distress. Continue to progress workloads to maintain intensity without signs/symptoms of physical distress. Continue to progress workloads to maintain intensity without signs/symptoms of physical distress.       Resistance Training   Training Prescription Yes Yes Yes     Weight blue bands blue bands blue bands     Reps 10-12  10 min strength training 10-12  10 minutes of strength training 10-12  10 minutes of strength training       Interval Training   Interval Training  - No No       Treadmill   MPH  - 2 2.5  tolerated speed better, did not desaturate today     Grade  - 1 3     Minutes  -  17 17       Bike   Level 1  -  -     Minutes 17  -  -       NuStep   Level '4 4 4     '$ Minutes '17 17 17     '$ METs 3.1 2 2.9       Home Exercise Plan   Plans to continue exercise at  - Home  -     Frequency  - Add 3 additional days to program exercise sessions.  -        Exercise Comments:     Exercise Comments    Row Name  08/19/15 1941 09/07/15 1538 09/16/15 0852       Exercise Comments Just started program, has exercised 2 sessions, tolerating well Completed home exercise prescription today Patient is progressing well in program. Oxygen saturations are sometimes borderline. Will continue to progress and monitor.         Discharge Exercise Prescription (Final Exercise Prescription Changes):     Exercise Prescription Changes - 09/09/15 1500      Response to Exercise   Blood Pressure (Admit) 118/64   Blood Pressure (Exercise) 140/68   Blood Pressure (Exit) 124/76   Heart Rate (Admit) 97 bpm   Heart Rate (Exercise) 121 bpm   Heart Rate (Exit) 104 bpm   Oxygen Saturation (Admit) 95 %   Oxygen Saturation (Exercise) 90 %   Oxygen Saturation (Exit) 97 %   Rating of Perceived Exertion (Exercise) 13   Perceived Dyspnea (Exercise) 1   Duration Progress to 45 minutes of aerobic exercise without signs/symptoms of physical distress   Intensity THRR unchanged     Progression   Progression Continue to progress workloads to maintain intensity without signs/symptoms of physical distress.     Resistance Training   Training Prescription Yes   Weight blue bands   Reps 10-12  10 minutes of strength training     Interval Training   Interval Training No     Treadmill   MPH 2.5  tolerated speed better, did not desaturate today   Grade 3   Minutes 17     NuStep   Level 4   Minutes 17   METs 2.9       Nutrition:  Target Goals: Understanding of nutrition guidelines, daily intake of sodium '1500mg'$ , cholesterol '200mg'$ , calories 30% from fat and 7% or less from saturated fats, daily to have 5 or more servings of fruits and vegetables.  Biometrics:     Pre Biometrics - 08/02/15 1032      Pre Biometrics   Grip Strength 50 kg       Nutrition Therapy Plan and Nutrition Goals:     Nutrition Therapy & Goals - 09/07/15 1523      Nutrition Therapy   Diet Therapeutic Lifestyle Changes     Personal  Nutrition Goals   Personal Goal #1 Maintain wt while in Pulmonary Rehab     Intervention Plan   Intervention Prescribe, educate and counsel regarding individualized specific dietary modifications aiming towards targeted core components such as weight, hypertension, lipid management, diabetes, heart failure and other comorbidities.   Expected Outcomes Short Term Goal: Understand basic principles of dietary content, such as calories, fat, sodium, cholesterol and nutrients.;Long Term Goal: Adherence to prescribed nutrition plan.      Nutrition Discharge: Rate Your Plate Scores:     Nutrition Assessments - 09/07/15 1523      Rate Your Plate Scores  Pre Score 50      Psychosocial: Target Goals: Acknowledge presence or absence of depression, maximize coping skills, provide positive support system. Participant is able to verbalize types and ability to use techniques and skills needed for reducing stress and depression.  Initial Review & Psychosocial Screening:     Initial Psych Review & Screening - 08/02/15 [place]? Yes     Barriers   Psychosocial barriers to participate in program There are no identifiable barriers or psychosocial needs.     Screening Interventions   Interventions Encouraged to exercise      Quality of Life Scores:     Quality of Life - 08/03/15 1504      Quality of Life Scores   Health/Function Pre 16.23 %   Socioeconomic Pre 17.44 %   Psych/Spiritual Pre 27.43 %   Family Pre 22.5 %   GLOBAL Pre 19.5 %      PHQ-9: Recent Review Flowsheet Data    Depression screen [facility] 2/9 08/02/2015 06/24/2015 04/15/2015 01/15/2015 10/20/2014   Decreased Interest 0 0 0 0 0   Down, Depressed, Hopeless 0 1 2 0 0   PHQ - 2 Score 0 1 2 0 0   Altered sleeping - - 2 - -   Tired, decreased energy - - 3 - -   Change in appetite - - 0 - -   Feeling bad or failure about yourself  - - 3 - -   Trouble concentrating - - 0 - -   Moving  slowly or fidgety/restless - - 1 - -   Suicidal thoughts - - 1 - -   PHQ-9 Score - - 12 - -      Psychosocial Evaluation and Intervention:     Psychosocial Evaluation - 08/02/15 1036      Psychosocial Evaluation & Interventions   Interventions Encouraged to exercise with the program and follow exercise prescription   Continued Psychosocial Services Needed No      Psychosocial Re-Evaluation:     Psychosocial Re-Evaluation    Row Name 08/16/15 1357 09/13/15 1551           Psychosocial Re-Evaluation   Interventions Encouraged to attend Pulmonary Rehabilitation for the exercise Encouraged to attend Pulmonary Rehabilitation for the exercise      Comments No psychosocial issues identified at this time. -  no concerns at this time      Continued Psychosocial Services Needed No No        Education: Education Goals: Education classes will be provided on a weekly basis, covering required topics. Participant will state understanding/return demonstration of topics presented.  Learning Barriers/Preferences:     Learning Barriers/Preferences - 08/02/15 1024      Learning Barriers/Preferences   Learning Barriers None   Learning Preferences Skilled Demonstration;Group Instruction      Education Topics: Risk Factor Reduction:  -Group instruction that is supported by a PowerPoint presentation. Instructor discusses the definition of a risk factor, different risk factors for pulmonary disease, and how the heart and lungs work together.     Nutrition for Pulmonary Patient:  -Group instruction provided by PowerPoint slides, verbal discussion, and written materials to support subject matter. The instructor gives an explanation and review of healthy diet recommendations, which includes a discussion on weight management, recommendations for fruit and vegetable consumption, as well as protein, fluid, caffeine, fiber, sodium, sugar, and alcohol. Tips for eating when patients are short of  breath are discussed.   Pursed Lip Breathing:  -Group instruction that is supported by demonstration and informational handouts. Instructor discusses the benefits of pursed lip and diaphragmatic breathing and detailed demonstration on how to preform both.   Flowsheet Row PULMONARY REHAB OTHER RESPIRATORY from 09/02/2015 in [place]  Date  09/02/15  Educator  EP  Instruction Review Code  2- meets goals/outcomes      Oxygen Safety:  -Group instruction provided by PowerPoint, verbal discussion, and written material to support subject matter. There is an overview of "What is Oxygen" and "Why do we need it".  Instructor also reviews how to create a safe environment for oxygen use, the importance of using oxygen as prescribed, and the risks of noncompliance. There is a brief discussion on traveling with oxygen and resources the patient may utilize.   Oxygen Equipment:  -Group instruction provided by [facility] Staff utilizing handouts, written materials, and equipment demonstrations.   Signs and Symptoms:  -Group instruction provided by written material and verbal discussion to support subject matter. Warning signs and symptoms of infection, stroke, and heart attack are reviewed and when to call the physician/911 reinforced. Tips for preventing the spread of infection discussed. Flowsheet Row PULMONARY REHAB OTHER RESPIRATORY from 09/02/2015 in [place]  Date  08/19/15  Educator  Remo Lipps, RN  Instruction Review Code  2- meets goals/outcomes      Advanced Directives:  -Group instruction provided by verbal instruction and written material to support subject matter. Instructor reviews Advanced Directive laws and proper instruction for filling out document.   Pulmonary Video:  -Group video education that reviews the importance of medication and oxygen compliance, exercise, good nutrition, pulmonary hygiene, and pursed lip and  diaphragmatic breathing for the pulmonary patient.   Exercise for the Pulmonary Patient:  -Group instruction that is supported by a PowerPoint presentation. Instructor discusses benefits of exercise, core components of exercise, frequency, duration, and intensity of an exercise routine, importance of utilizing pulse oximetry during exercise, safety while exercising, and options of places to exercise outside of rehab.   Flowsheet Row PULMONARY REHAB OTHER RESPIRATORY from 09/02/2015 in [place]  Date  08/12/15  Educator  EP  Instruction Review Code  2- meets goals/outcomes      Pulmonary Medications:  -Verbally interactive group education provided by instructor with focus on inhaled medications and proper administration.   Anatomy and Physiology of the Respiratory System and Intimacy:  -Group instruction provided by PowerPoint, verbal discussion, and written material to support subject matter. Instructor reviews respiratory cycle and anatomical components of the respiratory system and their functions. Instructor also reviews differences in obstructive and restrictive respiratory diseases with examples of each. Intimacy, Sex, and Sexuality differences are reviewed with a discussion on how relationships can change when diagnosed with pulmonary disease. Common sexual concerns are reviewed.   Knowledge Questionnaire Score:     Knowledge Questionnaire Score - 08/03/15 1504      Knowledge Questionnaire Score   Pre Score 8/13      Core Components/Risk Factors/Patient Goals at Admission:     Personal Goals and Risk Factors at Admission - 08/02/15 1033      Core Components/Risk Factors/Patient Goals on Admission   Increase Strength and Stamina Yes   Intervention Provide advice, education, support and counseling about physical activity/exercise needs.;Develop an individualized exercise prescription for aerobic and resistive training based on initial  evaluation findings, risk stratification, comorbidities and  participant's personal goals.   Expected Outcomes Achievement of increased cardiorespiratory fitness and enhanced flexibility, muscular endurance and strength shown through measurements of functional capacity and personal statement of participant.   Improve shortness of breath with ADL's Yes   Intervention Provide education, individualized exercise plan and daily activity instruction to help decrease symptoms of SOB with activities of daily living.   Expected Outcomes Short Term: Achieves a reduction of symptoms when performing activities of daily living.      Core Components/Risk Factors/Patient Goals Review:      Goals and Risk Factor Review    Row Name 08/02/15 1034 08/16/15 1351 09/13/15 1549         Core Components/Risk Factors/Patient Goals Review   Personal Goals Review Increase Strength and Stamina;Improve shortness of breath with ADL's Increase Strength and Stamina;Improve shortness of breath with ADL's  -     Review Increase strength and stamina by increasing exercise has only exercised 3 sessions with [place], too early to see an improvement attended 8 exercise sessions, increasing worloads and tolerating well     Expected Outcomes Improved strength and stamina as before lung surgery Should see an increase in strength and stamina in the next 30 days Improved strength and stamina        Core Components/Risk Factors/Patient Goals at Discharge (Final Review):      Goals and Risk Factor Review - 09/13/15 1549      Core Components/Risk Factors/Patient Goals Review   Review attended 8 exercise sessions, increasing worloads and tolerating well   Expected Outcomes Improved strength and stamina      ITP Comments:   Comments: ITP REVIEW Pt is making expected progress toward personal goals after completing 8 sessions.   Recommend continued exercise, life style modification, education, and utilization of breathing techniques  to increase stamina and strength and decrease shortness of breath with exertion.

## 2015-09-17 ENCOUNTER — Other Ambulatory Visit: Payer: Self-pay | Admitting: Physician Assistant

## 2015-09-21 ENCOUNTER — Encounter (HOSPITAL_COMMUNITY)
Admission: RE | Admit: 2015-09-21 | Discharge: 2015-09-21 | Disposition: A | Discharge status: Home / Self Care | Payer: Commercial Managed Care - HMO | Source: Ambulatory Visit | Attending: Thoracic Surgery (Cardiothoracic Vascular Surgery) | Admitting: Thoracic Surgery (Cardiothoracic Vascular Surgery)

## 2015-09-21 VITALS — Wt 175.7 lb

## 2015-09-21 DIAGNOSIS — C3431 Malignant neoplasm of lower lobe, right bronchus or lung: Secondary | ICD-10-CM

## 2015-09-21 DIAGNOSIS — J449 Chronic obstructive pulmonary disease, unspecified: Secondary | ICD-10-CM | POA: Diagnosis not present

## 2015-09-21 NOTE — Progress Notes (Signed)
Daily Session Note  Patient Details  Name: Elijah Marshall MRN: [id number] Date of Birth: 1950-12-05 Referring Provider:   April Manson Pulmonary Rehab Walk Test from 08/05/2015 in [place]  Referring Provider  Dr. Roxan Hockey      Encounter Date: 09/21/2015  Check In:     Session Check In - 09/21/15 1339      Check-In   Location [facility]   Staff Present Rosebud Poles, RN, BSN;Elyas Villamor Ysidro Evert, Felipe Drone, RN, MHA;Portia Rollene Rotunda, RN, BSN;Molly diVincenzo, MS, ACSM RCEP, Exercise Physiologist   Physician(s) Dr. Marily Memos   Medication changes reported     No   Fall or balance concerns reported    No   Warm-up and Cool-down Performed as group-led instruction   Resistance Training Performed Yes   VAD Patient? No     Pain Assessment   Currently in Pain? No/denies   Multiple Pain Sites No      Capillary Blood Glucose: No results found for this or any previous visit (from the past 24 hour(s)).      Exercise Prescription Changes - 09/21/15 1500      Response to Exercise   Blood Pressure (Admit) 138/80   Blood Pressure (Exercise) 144/80   Blood Pressure (Exit) 116/70   Heart Rate (Admit) 101 bpm   Heart Rate (Exercise) 131 bpm   Heart Rate (Exit) 112 bpm   Oxygen Saturation (Admit) 95 %   Oxygen Saturation (Exercise) 90 %   Oxygen Saturation (Exit) 97 %   Rating of Perceived Exertion (Exercise) 13   Perceived Dyspnea (Exercise) 1   Duration Progress to 45 minutes of aerobic exercise without signs/symptoms of physical distress   Intensity THRR unchanged     Progression   Progression Continue to progress workloads to maintain intensity without signs/symptoms of physical distress.     Resistance Training   Training Prescription Yes   Weight blue bands   Reps 10-12  10 minutes of strength training     Interval Training   Interval Training No     Treadmill   MPH 2.5   Grade 3   Minutes 17     Recumbant  Bike   Level 4   Minutes 17     NuStep   Level 4   Minutes 17   METs 3.2     Goals Met:  Exercise tolerated well No report of cardiac concerns or symptoms Strength training completed today  Goals Unmet:  Not Applicable  Comments: Service time is from 1330 to 1510    Dr. Rush Farmer is Medical Director for Pulmonary Rehab at [facility]

## 2015-09-22 DIAGNOSIS — M1990 Unspecified osteoarthritis, unspecified site: Secondary | ICD-10-CM | POA: Diagnosis not present

## 2015-09-22 DIAGNOSIS — Z79899 Other long term (current) drug therapy: Secondary | ICD-10-CM | POA: Diagnosis not present

## 2015-09-22 DIAGNOSIS — Z7952 Long term (current) use of systemic steroids: Secondary | ICD-10-CM | POA: Diagnosis not present

## 2015-09-22 DIAGNOSIS — F329 Major depressive disorder, single episode, unspecified: Secondary | ICD-10-CM | POA: Diagnosis not present

## 2015-09-22 DIAGNOSIS — C3431 Malignant neoplasm of lower lobe, right bronchus or lung: Secondary | ICD-10-CM | POA: Diagnosis not present

## 2015-09-22 DIAGNOSIS — K219 Gastro-esophageal reflux disease without esophagitis: Secondary | ICD-10-CM | POA: Diagnosis not present

## 2015-09-22 DIAGNOSIS — I10 Essential (primary) hypertension: Secondary | ICD-10-CM | POA: Diagnosis not present

## 2015-09-22 DIAGNOSIS — Z87891 Personal history of nicotine dependence: Secondary | ICD-10-CM | POA: Diagnosis not present

## 2015-09-22 DIAGNOSIS — J449 Chronic obstructive pulmonary disease, unspecified: Secondary | ICD-10-CM | POA: Diagnosis present

## 2015-09-22 DIAGNOSIS — Z7951 Long term (current) use of inhaled steroids: Secondary | ICD-10-CM | POA: Diagnosis not present

## 2015-09-23 ENCOUNTER — Encounter (HOSPITAL_COMMUNITY)
Admission: RE | Admit: 2015-09-23 | Discharge: 2015-09-23 | Disposition: A | Discharge status: Home / Self Care | Payer: Commercial Managed Care - HMO | Source: Ambulatory Visit | Attending: Thoracic Surgery (Cardiothoracic Vascular Surgery) | Admitting: Thoracic Surgery (Cardiothoracic Vascular Surgery)

## 2015-09-23 VITALS — Wt 177.7 lb

## 2015-09-23 DIAGNOSIS — C3431 Malignant neoplasm of lower lobe, right bronchus or lung: Secondary | ICD-10-CM

## 2015-09-23 DIAGNOSIS — J449 Chronic obstructive pulmonary disease, unspecified: Secondary | ICD-10-CM | POA: Diagnosis not present

## 2015-09-23 NOTE — Progress Notes (Signed)
Daily Session Note  Patient Details  Name: Elijah Marshall MRN: [id number] Date of Birth: 05/18/1950 Referring Provider:   April Manson Pulmonary Rehab Walk Test from 08/05/2015 in [place]  Referring Provider  Dr. Roxan Hockey      Encounter Date: 09/23/2015  Check In:     Session Check In - 09/23/15 1235      Check-In   Location [facility]   Staff Present Rosebud Poles, RN, BSN;Molly diVincenzo, MS, ACSM RCEP, Exercise Physiologist;Maddisen Vought Ysidro Evert, RN;Portia Rollene Rotunda, RN, BSN   Supervising physician immediately available to respond to emergencies [facility] immediately available   Physician(s) Dr. Marthenia Rolling   Medication changes reported     No   Fall or balance concerns reported    No   Warm-up and Cool-down Performed as group-led instruction   Resistance Training Performed Yes   VAD Patient? No     Pain Assessment   Currently in Pain? No/denies   Multiple Pain Sites No      Capillary Blood Glucose: No results found for this or any previous visit (from the past 24 hour(s)).      Exercise Prescription Changes - 09/23/15 1500      Response to Exercise   Blood Pressure (Admit) 144/74   Blood Pressure (Exercise) 154/86   Blood Pressure (Exit) 118/70   Heart Rate (Admit) 96 bpm   Heart Rate (Exercise) 115 bpm   Heart Rate (Exit) 93 bpm   Oxygen Saturation (Admit) 96 %   Oxygen Saturation (Exercise) 93 %   Oxygen Saturation (Exit) 97 %   Rating of Perceived Exertion (Exercise) 13   Perceived Dyspnea (Exercise) 1   Duration Progress to 45 minutes of aerobic exercise without signs/symptoms of physical distress   Intensity THRR unchanged     Progression   Progression Continue to progress workloads to maintain intensity without signs/symptoms of physical distress.     Resistance Training   Training Prescription Yes   Weight blue bands   Reps 10-12  10 minutes of strength training     Interval Training   Interval  Training No     Oxygen   Oxygen Continuous   Liters 2     Treadmill   MPH 2.5   Grade 3   Minutes 17     NuStep   Level 4   Minutes 17   METs 3.1     Goals Met:  Exercise tolerated well No report of cardiac concerns or symptoms Strength training completed today  Goals Unmet:  Not Applicable  Comments: Service time is from 1215 to 1445 Attended question and answer class with Dr. Nelda Marseille     Dr. Rush Farmer is Medical Director for Pulmonary Rehab at [facility].

## 2015-09-28 ENCOUNTER — Encounter (HOSPITAL_COMMUNITY)
Admission: RE | Admit: 2015-09-28 | Discharge: 2015-09-28 | Disposition: A | Discharge status: Home / Self Care | Payer: Commercial Managed Care - HMO | Source: Ambulatory Visit | Attending: Thoracic Surgery (Cardiothoracic Vascular Surgery) | Admitting: Thoracic Surgery (Cardiothoracic Vascular Surgery)

## 2015-09-28 DIAGNOSIS — J449 Chronic obstructive pulmonary disease, unspecified: Secondary | ICD-10-CM | POA: Diagnosis not present

## 2015-09-28 DIAGNOSIS — C3431 Malignant neoplasm of lower lobe, right bronchus or lung: Secondary | ICD-10-CM

## 2015-09-28 NOTE — Progress Notes (Signed)
Daily Session Note  Patient Details  Name: Elijah Marshall MRN: [id number] Date of Birth: 02/27/1950 Referring Provider:   Flowsheet Row Pulmonary Rehab Walk Test from 08/05/2015 in [facility]  Referring Provider  Dr. Hendrickson      Encounter Date: 09/28/2015  Check In:     Session Check In - 09/28/15 1524      Check-In   [facility]   Staff Present Molly diVincenzo, MS, ACSM RCEP, Exercise Physiologist; , RN;Portia Payne, RN, BSN   Supervising physician immediately available to respond to emergencies [facility] immediately available   Physician(s) r. Doutova   Medication changes reported     No   Fall or balance concerns reported    No   Warm-up and Cool-down Performed as group-led instruction   Resistance Training Performed Yes   VAD Patient? No     Pain Assessment   Currently in Pain? No/denies   Multiple Pain Sites No      Capillary Blood Glucose: No results found for this or any previous visit (from the past 24 hour(s)).      Exercise Prescription Changes - 09/28/15 1500      Response to Exercise   Blood Pressure (Admit) 100/80   Blood Pressure (Exercise) 130/74   Blood Pressure (Exit) 154/80  recheck BP 142/84 after more rest   Heart Rate (Admit) 105 bpm   Heart Rate (Exercise) 131 bpm   Heart Rate (Exit) 114 bpm   Oxygen Saturation (Admit) 97 %   Oxygen Saturation (Exercise) 91 %   Oxygen Saturation (Exit) 97 %   Rating of Perceived Exertion (Exercise) 11   Perceived Dyspnea (Exercise) 1   Duration Progress to 45 minutes of aerobic exercise without signs/symptoms of physical distress   Intensity THRR unchanged     Progression   Progression Continue to progress workloads to maintain intensity without signs/symptoms of physical distress.     Resistance Training   Training Prescription Yes   Weight blue bands   Reps 10-12  10 minutes of strength training     Interval  Training   Interval Training No     Oxygen   Oxygen Continuous   Liters 2     Treadmill   MPH 2.5   Grade 3   Minutes 17     Recumbant Bike   Level 2  reducted to 2 due to knee pain   Minutes 17     NuStep   Level 4   Minutes 17   METs 3     Goals Met:  Exercise tolerated well No report of cardiac concerns or symptoms Strength training completed today  Goals Unmet:  Not Applicable  Comments: Service time is from 1330 to 1500    Dr. Wesam G. Yacoub is Medical Director for Pulmonary Rehab at [facility]. 

## 2015-09-30 ENCOUNTER — Encounter (HOSPITAL_COMMUNITY)
Admission: RE | Admit: 2015-09-30 | Discharge: 2015-09-30 | Disposition: A | Discharge status: Home / Self Care | Payer: Commercial Managed Care - HMO | Source: Ambulatory Visit | Attending: Thoracic Surgery (Cardiothoracic Vascular Surgery) | Admitting: Thoracic Surgery (Cardiothoracic Vascular Surgery)

## 2015-09-30 ENCOUNTER — Ambulatory Visit: Payer: Self-pay | Admitting: Family Medicine

## 2015-09-30 VITALS — Wt 178.6 lb

## 2015-09-30 DIAGNOSIS — C3431 Malignant neoplasm of lower lobe, right bronchus or lung: Secondary | ICD-10-CM

## 2015-09-30 DIAGNOSIS — J449 Chronic obstructive pulmonary disease, unspecified: Secondary | ICD-10-CM | POA: Diagnosis not present

## 2015-09-30 NOTE — Progress Notes (Signed)
Daily Session Note  Patient Details  Name: Elijah Marshall MRN: [id number] Date of Birth: [birth year] Referring Provider:   April Manson Pulmonary Rehab Walk Test from 08/05/2015 in [place]  Referring Provider  Dr. Roxan Hockey      Encounter Date: 09/30/2015  Check In:     Session Check In - 09/30/15 1341      Check-In   [facility]   Staff Present Su Hilt, MS, ACSM RCEP, Exercise Physiologist;Darrel Gloss Ysidro Evert, RN;Portia Rollene Rotunda, RN, BSN   Supervising physician immediately available to respond to emergencies [facility] immediately available   Physician(s) Dr. Allyson Sabal   Medication changes reported     No   Fall or balance concerns reported    No   Warm-up and Cool-down Performed as group-led instruction   Resistance Training Performed Yes   VAD Patient? No     Pain Assessment   Currently in Pain? No/denies   Multiple Pain Sites No      Capillary Blood Glucose: No results found for this or any previous visit (from the past 24 hour(s)).      Exercise Prescription Changes - 09/30/15 1600      Response to Exercise   Blood Pressure (Admit) 136/74   Blood Pressure (Exercise) 124/80   Blood Pressure (Exit) 134/70   Heart Rate (Admit) 101 bpm   Heart Rate (Exercise) 116 bpm   Heart Rate (Exit) 110 bpm   Oxygen Saturation (Admit) 89 %   Oxygen Saturation (Exercise) 90 %   Oxygen Saturation (Exit) 97 %   Rating of Perceived Exertion (Exercise) 11   Perceived Dyspnea (Exercise) 1   Duration Progress to 45 minutes of aerobic exercise without signs/symptoms of physical distress   Intensity THRR unchanged     Progression   Progression Continue to progress workloads to maintain intensity without signs/symptoms of physical distress.     Resistance Training   Training Prescription Yes   Weight blue bands   Reps 10-12  10 minutes of strength  training     Interval Training   Interval Training No     Oxygen   Oxygen Continuous   Liters 2     Treadmill   MPH 2.5   Grade 3   Minutes 17     NuStep   Level 4   Minutes 17     Goals Met:  Exercise tolerated well No report of cardiac concerns or symptoms Strength training completed today  Goals Unmet:  Not Applicable  Comments: Service time is from 1330 to 1530    Dr. Rush Farmer is Medical Director for Pulmonary Rehab at [facility].

## 2015-10-05 ENCOUNTER — Encounter (HOSPITAL_COMMUNITY)
Admission: RE | Admit: 2015-10-05 | Discharge: 2015-10-05 | Disposition: A | Discharge status: Home / Self Care | Payer: Commercial Managed Care - HMO | Source: Ambulatory Visit | Attending: Thoracic Surgery (Cardiothoracic Vascular Surgery) | Admitting: Thoracic Surgery (Cardiothoracic Vascular Surgery)

## 2015-10-05 VITALS — Wt 178.4 lb

## 2015-10-05 DIAGNOSIS — J449 Chronic obstructive pulmonary disease, unspecified: Secondary | ICD-10-CM | POA: Diagnosis not present

## 2015-10-05 DIAGNOSIS — C3431 Malignant neoplasm of lower lobe, right bronchus or lung: Secondary | ICD-10-CM

## 2015-10-05 NOTE — Progress Notes (Signed)
Daily Session Note  Patient Details  Name: Elijah Marshall MRN: [id number] Date of Birth: 05/17/1950 Referring Provider:   Flowsheet Row Pulmonary Rehab Walk Test from 08/05/2015 in [facility]  Referring Provider  Dr. Hendrickson      Encounter Date: 10/05/2015  Check In:     Session Check In - 10/05/15 1329      Check-In   [facility]   Staff Present Joan Behrens, RN, BSN;Molly diVincenzo, MS, ACSM RCEP, Exercise Physiologist; , RN;Portia Payne, RN, BSN   Supervising physician immediately available to respond to emergencies [facility] immediately available   Physician(s) Dr. Merrell   Medication changes reported     No   Fall or balance concerns reported    No   Warm-up and Cool-down Performed as group-led instruction   Resistance Training Performed Yes   VAD Patient? No     Pain Assessment   Currently in Pain? No/denies   Multiple Pain Sites No      Capillary Blood Glucose: No results found for this or any previous visit (from the past 24 hour(s)).      Exercise Prescription Changes - 10/05/15 1500      Response to Exercise   Blood Pressure (Admit) 128/72   Blood Pressure (Exercise) 160/86   Blood Pressure (Exit) 114/62   Heart Rate (Admit) 98 bpm   Heart Rate (Exercise) 111 bpm   Heart Rate (Exit) 110 bpm   Oxygen Saturation (Admit) 95 %   Oxygen Saturation (Exercise) 89 %   Oxygen Saturation (Exit) 95 %   Rating of Perceived Exertion (Exercise) 13   Perceived Dyspnea (Exercise) 1   Duration Progress to 45 minutes of aerobic exercise without signs/symptoms of physical distress   Intensity THRR unchanged     Progression   Progression Continue to progress workloads to maintain intensity without signs/symptoms of physical distress.     Resistance Training   Training Prescription Yes   Weight blue bands   Reps 10-12  10 minutes of strength training     Interval Training   Interval  Training Yes     Oxygen   Oxygen Continuous   Liters 2     Treadmill   MPH 2.5   Grade 3   Minutes 17     Recumbant Bike   Level 2   Minutes 17     NuStep   Level 4   Minutes 17   METs 3.3     Goals Met:  Exercise tolerated well No report of cardiac concerns or symptoms Strength training completed today  Goals Unmet:  Not Applicable  Comments: Service time is from 1330 to 1500    Dr. Wesam G. Yacoub is Medical Director for Pulmonary Rehab at [facility]. 

## 2015-10-07 ENCOUNTER — Encounter (HOSPITAL_COMMUNITY)
Admission: RE | Admit: 2015-10-07 | Discharge: 2015-10-07 | Disposition: A | Discharge status: Home / Self Care | Payer: Commercial Managed Care - HMO | Source: Ambulatory Visit | Attending: Thoracic Surgery (Cardiothoracic Vascular Surgery) | Admitting: Thoracic Surgery (Cardiothoracic Vascular Surgery)

## 2015-10-07 VITALS — Wt 175.5 lb

## 2015-10-07 DIAGNOSIS — J449 Chronic obstructive pulmonary disease, unspecified: Secondary | ICD-10-CM | POA: Diagnosis not present

## 2015-10-07 DIAGNOSIS — C3431 Malignant neoplasm of lower lobe, right bronchus or lung: Secondary | ICD-10-CM

## 2015-10-07 NOTE — Progress Notes (Signed)
Daily Session Note  Patient Details  Name: Valin E Medley MRN: [id number] Date of Birth: 06/03/1950 Referring Provider:   Flowsheet Row Pulmonary Rehab Walk Test from 08/05/2015 in [facility]  Referring Provider  Dr. Hendrickson      Encounter Date: 10/07/2015  Check In:     Session Check In - 10/07/15 1341      Check-In   [facility]   Staff Present Joan Behrens, RN, BSN;Molly diVincenzo, MS, ACSM RCEP, Exercise Physiologist;Shearon Clonch, RN;Annedrea Stackhouse, RN, MHA;Portia Payne, RN, BSN   Supervising physician immediately available to respond to emergencies [facility] immediately available   Physician(s) Dr. Merrell   Medication changes reported     No   Fall or balance concerns reported    No   Warm-up and Cool-down Performed as group-led instruction   Resistance Training Performed Yes   VAD Patient? No     Pain Assessment   Currently in Pain? No/denies   Multiple Pain Sites No      Capillary Blood Glucose: No results found for this or any previous visit (from the past 24 hour(s)).      Exercise Prescription Changes - 10/07/15 1500      Exercise Review   Progression Yes     Response to Exercise   Blood Pressure (Admit) 144/80   Blood Pressure (Exercise) 126/90   Blood Pressure (Exit) 130/70   Heart Rate (Admit) 95 bpm   Heart Rate (Exercise) 116 bpm   Heart Rate (Exit) 108 bpm   Oxygen Saturation (Admit) 92 %   Oxygen Saturation (Exercise) 91 %   Oxygen Saturation (Exit) 98 %   Rating of Perceived Exertion (Exercise) 13   Perceived Dyspnea (Exercise) 1   Duration Progress to 45 minutes of aerobic exercise without signs/symptoms of physical distress   Intensity THRR unchanged     Progression   Progression Continue to progress workloads to maintain intensity without signs/symptoms of physical distress.     Resistance Training   Training Prescription Yes   Weight blue bands   Reps 10-12   10 minutes of strength training     Interval Training   Interval Training Yes     Oxygen   Oxygen Continuous   Liters 2     Treadmill   MPH 2.5   Grade 3   Minutes 17     NuStep   Level 5   Minutes 17   METs 2.6     Goals Met:  Exercise tolerated well No report of cardiac concerns or symptoms Strength training completed today  Goals Unmet:  Not Applicable  Comments: Service time is from 1330 to 1505    Dr. Wesam G. Yacoub is Medical Director for Pulmonary Rehab at [facility]. 

## 2015-10-12 ENCOUNTER — Encounter (HOSPITAL_COMMUNITY)
Admission: RE | Admit: 2015-10-12 | Discharge: 2015-10-12 | Disposition: A | Discharge status: Home / Self Care | Payer: Commercial Managed Care - HMO | Source: Ambulatory Visit | Attending: Thoracic Surgery (Cardiothoracic Vascular Surgery) | Admitting: Thoracic Surgery (Cardiothoracic Vascular Surgery)

## 2015-10-12 VITALS — Wt 178.8 lb

## 2015-10-12 DIAGNOSIS — I10 Essential (primary) hypertension: Secondary | ICD-10-CM | POA: Insufficient documentation

## 2015-10-12 DIAGNOSIS — K219 Gastro-esophageal reflux disease without esophagitis: Secondary | ICD-10-CM | POA: Diagnosis not present

## 2015-10-12 DIAGNOSIS — J449 Chronic obstructive pulmonary disease, unspecified: Secondary | ICD-10-CM | POA: Insufficient documentation

## 2015-10-12 DIAGNOSIS — M1990 Unspecified osteoarthritis, unspecified site: Secondary | ICD-10-CM | POA: Diagnosis not present

## 2015-10-12 DIAGNOSIS — Z87891 Personal history of nicotine dependence: Secondary | ICD-10-CM | POA: Diagnosis not present

## 2015-10-12 DIAGNOSIS — Z7951 Long term (current) use of inhaled steroids: Secondary | ICD-10-CM | POA: Insufficient documentation

## 2015-10-12 DIAGNOSIS — F329 Major depressive disorder, single episode, unspecified: Secondary | ICD-10-CM | POA: Insufficient documentation

## 2015-10-12 DIAGNOSIS — Z7952 Long term (current) use of systemic steroids: Secondary | ICD-10-CM | POA: Insufficient documentation

## 2015-10-12 DIAGNOSIS — Z79899 Other long term (current) drug therapy: Secondary | ICD-10-CM | POA: Insufficient documentation

## 2015-10-12 DIAGNOSIS — C3431 Malignant neoplasm of lower lobe, right bronchus or lung: Secondary | ICD-10-CM | POA: Diagnosis not present

## 2015-10-12 NOTE — Progress Notes (Signed)
Daily Session Note  Patient Details  Name: RHYSE LOUX MRN: [id number] Date of Birth: [birth year] Referring Provider:   April Manson Pulmonary Rehab Walk Test from 08/05/2015 in [place]  Referring Provider  Dr. Roxan Hockey      Encounter Date: 10/12/2015  Check In:     Session Check In - 10/12/15 1336      Check-In   Location [facility]   Staff Present Rosebud Poles, RN, BSN;Lisa Ysidro Evert, Felipe Drone, RN, MHA;Portia Rollene Rotunda, RN, BSN   Supervising physician immediately available to respond to emergencies [facility] immediately available   Physician(s) Dr. Marily Memos   Medication changes reported     No   Fall or balance concerns reported    No   Warm-up and Cool-down Performed as group-led instruction   Resistance Training Performed Yes   VAD Patient? No     Pain Assessment   Currently in Pain? No/denies   Multiple Pain Sites No      Capillary Blood Glucose: No results found for this or any previous visit (from the past 24 hour(s)).      Exercise Prescription Changes - 10/12/15 1500      Response to Exercise   Blood Pressure (Admit) 132/74   Blood Pressure (Exercise) 176/80   Blood Pressure (Exit) 120/74   Heart Rate (Admit) 93 bpm   Heart Rate (Exercise) 112 bpm   Heart Rate (Exit) 97 bpm   Oxygen Saturation (Admit) 97 %   Oxygen Saturation (Exercise) 95 %   Oxygen Saturation (Exit) 99 %   Rating of Perceived Exertion (Exercise) 13   Perceived Dyspnea (Exercise) 1   Duration Progress to 45 minutes of aerobic exercise without signs/symptoms of physical distress   Intensity THRR unchanged     Progression   Progression Continue to progress workloads to maintain intensity without signs/symptoms of physical distress.     Resistance Training   Training Prescription Yes   Weight blue bands   Reps 10-12  10 minutes of strength training     Interval Training   Interval Training Yes     Oxygen    Oxygen Continuous   Liters 2     Treadmill   MPH 2.5   Grade 3   Minutes 17     Recumbant Bike   Level 4   Minutes 17     NuStep   Level 5   Minutes 17   METs 3.6     Goals Met:  Exercise tolerated well Strength training completed today  Goals Unmet:  Not Applicable  Comments: Service time is from 1330 to 1515    Dr. Rush Farmer is Medical Director for Pulmonary Rehab at [facility].

## 2015-10-14 ENCOUNTER — Encounter (HOSPITAL_COMMUNITY)
Admission: RE | Admit: 2015-10-14 | Discharge: 2015-10-14 | Disposition: A | Discharge status: Home / Self Care | Payer: Commercial Managed Care - HMO | Source: Ambulatory Visit | Attending: Thoracic Surgery (Cardiothoracic Vascular Surgery) | Admitting: Thoracic Surgery (Cardiothoracic Vascular Surgery)

## 2015-10-14 DIAGNOSIS — J449 Chronic obstructive pulmonary disease, unspecified: Secondary | ICD-10-CM | POA: Diagnosis not present

## 2015-10-14 DIAGNOSIS — C3431 Malignant neoplasm of lower lobe, right bronchus or lung: Secondary | ICD-10-CM

## 2015-10-14 NOTE — Progress Notes (Signed)
Daily Session Note  Patient Details  Name: Elijah Marshall MRN: [id number] Date of Birth: 04-23-1950 Referring Provider:   April Manson Pulmonary Rehab Walk Test from 08/05/2015 in [place]  Referring Provider  Dr. Roxan Hockey      Encounter Date: 10/14/2015  Check In:     Session Check In - 10/14/15 1330      Check-In   Location [facility]   Staff Present Rosebud Poles, RN, BSN;Molly diVincenzo, MS, ACSM RCEP, Exercise Physiologist;Annedrea Rosezella [place], RN, MHA;Portia Rollene Rotunda, RN, BSN   Supervising physician immediately available to respond to emergencies [facility] immediately available   Physician(s) Dr. Allyson Sabal   Medication changes reported     No   Fall or balance concerns reported    No   Warm-up and Cool-down Performed as group-led instruction   Resistance Training Performed Yes   VAD Patient? No     Pain Assessment   Currently in Pain? No/denies   Multiple Pain Sites No      Capillary Blood Glucose: No results found for this or any previous visit (from the past 24 hour(s)).      Exercise Prescription Changes - 10/14/15 1500      Response to Exercise   Blood Pressure (Admit) 110/60   Blood Pressure (Exercise) 164/84   Blood Pressure (Exit) 124/70   Heart Rate (Admit) 86 bpm   Heart Rate (Exercise) 116 bpm   Heart Rate (Exit) 90 bpm   Oxygen Saturation (Admit) 96 %   Oxygen Saturation (Exercise) 94 %   Oxygen Saturation (Exit) 97 %   Rating of Perceived Exertion (Exercise) 13   Perceived Dyspnea (Exercise) 1   Duration Progress to 45 minutes of aerobic exercise without signs/symptoms of physical distress   Intensity THRR unchanged     Progression   Progression Continue to progress workloads to maintain intensity without signs/symptoms of physical distress.     Resistance Training   Training Prescription Yes   Weight blue bands   Reps 10-12  10 minutes of strength training     Interval Training   Interval Training Yes     Oxygen   Oxygen Continuous   Liters 2     Treadmill   MPH 2.5   Grade 3   Minutes 17     NuStep   Level 5   Minutes 17   METs 3.6     Goals Met:  Independence with exercise equipment Improved SOB with ADL's Exercise tolerated well Strength training completed today  Goals Unmet:  Not Applicable  Comments: Service time is from 1330 to 1515    Dr. Rush Farmer is Medical Director for Pulmonary Rehab at [facility].

## 2015-10-16 ENCOUNTER — Other Ambulatory Visit: Payer: Self-pay | Admitting: Family Medicine

## 2015-10-19 ENCOUNTER — Encounter (HOSPITAL_COMMUNITY)
Admission: RE | Admit: 2015-10-19 | Discharge: 2015-10-19 | Disposition: A | Discharge status: Home / Self Care | Payer: Commercial Managed Care - HMO | Source: Ambulatory Visit | Attending: Thoracic Surgery (Cardiothoracic Vascular Surgery) | Admitting: Thoracic Surgery (Cardiothoracic Vascular Surgery)

## 2015-10-19 VITALS — Wt 179.2 lb

## 2015-10-19 DIAGNOSIS — C3431 Malignant neoplasm of lower lobe, right bronchus or lung: Secondary | ICD-10-CM

## 2015-10-19 DIAGNOSIS — J449 Chronic obstructive pulmonary disease, unspecified: Secondary | ICD-10-CM | POA: Diagnosis not present

## 2015-10-19 NOTE — Progress Notes (Signed)
Daily Session Note  Patient Details  Name: Elijah Marshall MRN: [id number] Date of Birth: Nov 08, 1950 Referring Provider:   April Manson Pulmonary Rehab Walk Test from 08/05/2015 in [place]  Referring Provider  Dr. Roxan Hockey      Encounter Date: 10/19/2015  Check In:     Session Check In - 10/19/15 1344      Check-In   [facility]   Staff Present Su Hilt, MS, ACSM RCEP, Exercise Physiologist;Lisa Ysidro Evert, Felipe Drone, RN, MHA;Kimbley Sprague Rollene Rotunda, RN, BSN   Supervising physician immediately available to respond to emergencies [facility] immediately available   Physician(s) Dr. Marily Memos   Medication changes reported     No   Fall or balance concerns reported    No   Warm-up and Cool-down Performed as group-led instruction   Resistance Training Performed Yes     Pain Assessment   Currently in Pain? No/denies   Multiple Pain Sites No      Capillary Blood Glucose: No results found for this or any previous visit (from the past 24 hour(s)).      Exercise Prescription Changes - 10/19/15 1520      Response to Exercise   Blood Pressure (Admit) 138/84   Blood Pressure (Exercise) 160/82   Blood Pressure (Exit) 120/66   Heart Rate (Admit) 82 bpm   Heart Rate (Exercise) 103 bpm   Heart Rate (Exit) 95 bpm   Oxygen Saturation (Admit) 95 %   Oxygen Saturation (Exercise) 90 %   Oxygen Saturation (Exit) 98 %   Rating of Perceived Exertion (Exercise) 13   Perceived Dyspnea (Exercise) 2   Duration Progress to 45 minutes of aerobic exercise without signs/symptoms of physical distress   Intensity THRR unchanged     Progression   Progression Continue to progress workloads to maintain intensity without signs/symptoms of physical distress.     Resistance Training   Training Prescription Yes   Weight blue bands   Reps 10-12  10 minutes of strength training     Interval Training   Interval Training Yes      Oxygen   Oxygen Continuous   Liters 2     Treadmill   MPH 2.5   Grade 3   Minutes 17     Recumbant Bike   Level 4   Minutes 17     NuStep   Level 5   Minutes 17   METs 3.6     Goals Met:  Independence with exercise equipment Improved SOB with ADL's Using PLB without cueing & demonstrates good technique No report of cardiac concerns or symptoms Strength training completed today  Goals Unmet:  RPE. Complaints of muscular soreness. Patient encouraged to back off workloads but denied need.  Comments: Service time is from 1330 to 1500   Dr. Rush Farmer is Medical Director for Pulmonary Rehab at [facility].

## 2015-10-21 ENCOUNTER — Encounter (HOSPITAL_COMMUNITY)
Admission: RE | Admit: 2015-10-21 | Discharge: 2015-10-21 | Disposition: A | Discharge status: Home / Self Care | Payer: Commercial Managed Care - HMO | Source: Ambulatory Visit | Attending: Thoracic Surgery (Cardiothoracic Vascular Surgery) | Admitting: Thoracic Surgery (Cardiothoracic Vascular Surgery)

## 2015-10-21 VITALS — Wt 178.1 lb

## 2015-10-21 DIAGNOSIS — C3431 Malignant neoplasm of lower lobe, right bronchus or lung: Secondary | ICD-10-CM

## 2015-10-21 DIAGNOSIS — J449 Chronic obstructive pulmonary disease, unspecified: Secondary | ICD-10-CM | POA: Diagnosis not present

## 2015-10-21 NOTE — Progress Notes (Signed)
Daily Session Note  Patient Details  Name: Elijah Marshall MRN: [id number] Date of Birth: May 30, 1950 Referring Provider:   April Manson Pulmonary Rehab Walk Test from 08/05/2015 in [place]  Referring Provider  Dr. Roxan Hockey      Encounter Date: 10/21/2015  Check In:     Session Check In - 10/21/15 1325      Check-In   [facility]   Staff Present Su Hilt, MS, ACSM RCEP, Exercise Physiologist;Joan Leonia Reeves, RN, BSN;Elliette Seabolt, RN;Portia Rollene Rotunda, RN, BSN   Supervising physician immediately available to respond to emergencies [facility] immediately available   Physician(s) Dr. Waldron Labs   Medication changes reported     No   Fall or balance concerns reported    No   Warm-up and Cool-down Performed as group-led Location manager Performed Yes   VAD Patient? No     Pain Assessment   Currently in Pain? No/denies   Multiple Pain Sites No      Capillary Blood Glucose: No results found for this or any previous visit (from the past 24 hour(s)).      Exercise Prescription Changes - 10/21/15 1500      Response to Exercise   Blood Pressure (Admit) 114/60   Blood Pressure (Exercise) 160/90   Blood Pressure (Exit) 122/72   Heart Rate (Admit) 79 bpm   Heart Rate (Exercise) 97 bpm   Heart Rate (Exit) 90 bpm   Oxygen Saturation (Admit) 95 %   Oxygen Saturation (Exercise) 93 %   Oxygen Saturation (Exit) 95 %   Rating of Perceived Exertion (Exercise) 13   Perceived Dyspnea (Exercise) 2   Duration Progress to 45 minutes of aerobic exercise without signs/symptoms of physical distress   Intensity THRR unchanged     Progression   Progression Continue to progress workloads to maintain intensity without signs/symptoms of physical distress.     Resistance Training   Training Prescription Yes   Weight blue bands   Reps 10-12  10 minutes of strength training     Interval Training   Interval  Training Yes     Oxygen   Oxygen Continuous   Liters 2     Recumbant Bike   Level 4   Minutes 17     NuStep   Level 5   Minutes 17   METs 3.6     Goals Met:  Exercise tolerated well No report of cardiac concerns or symptoms Strength training completed today  Goals Unmet:  Not Applicable  Comments: Service time is from 1330 to 1500    Dr. Rush Farmer is Medical Director for Pulmonary Rehab at [facility].

## 2015-10-21 NOTE — Progress Notes (Signed)
Pulmonary Individual Treatment Plan  Patient Details  Name: ANIBAL QUINBY MRN: [id number] Date of Birth: 06-22-1950 Referring Provider:   April Manson Pulmonary Rehab Walk Test from 08/05/2015 in [place]  Referring Provider  Dr. Roxan Hockey      Initial Encounter Date:  Flowsheet Row Pulmonary Rehab Walk Test from 08/05/2015 in [place]  Date  08/05/15  Referring Provider  Dr. Roxan Hockey      Visit Diagnosis: Malignant neoplasm of lower lobe of right lung [facility])  Patient's Home Medications on Admission:   Current Outpatient Prescriptions:  .  Albuterol Sulfate (PROAIR RESPICLICK) 062 (90 BASE) MCG/ACT AEPB, Inhale 2 puffs into the lungs every 4 (four) hours as needed., Disp: 1 each, Rfl: 11 .  budesonide-formoterol (SYMBICORT) 80-4.5 MCG/ACT inhaler, Inhale 2 puffs into the lungs 2 (two) times daily., Disp: 1 Inhaler, Rfl: 5 .  clonazePAM (KLONOPIN) 1 MG tablet, Take 1/2 tablet as needed twice a day for anxiety and 1-2 tablets at night as needed for sleep, Disp: 60 tablet, Rfl: 0 .  dexlansoprazole (DEXILANT) 60 MG capsule, Take 1 capsule (60 mg total) by mouth daily., Disp: 30 capsule, Rfl: 5 .  Guaifenesin (MUCINEX MAXIMUM STRENGTH) 1200 MG TB12, Take 1 tablet (1,200 mg total) by mouth every 12 (twelve) hours as needed., Disp: 14 tablet, Rfl: 1 .  HYDROcodone-acetaminophen (NORCO/VICODIN) 5-325 MG tablet, Take 1-2 tablets by mouth every 6 (six) hours as needed for moderate pain., Disp: 75 tablet, Rfl: 0 .  levocetirizine (XYZAL) 5 MG tablet, Take 1 tablet (5 mg total) by mouth every evening., Disp: 30 tablet, Rfl: 11 .  metoprolol tartrate (LOPRESSOR) 25 MG tablet, TAKE 1/2 A TABLET BY MOUTH TWICE A DAY, Disp: 30 tablet, Rfl: 1 .  montelukast (SINGULAIR) 10 MG tablet, Take 1 tablet (10 mg total) by mouth at bedtime., Disp: 90 tablet, Rfl: 3 .  polyethylene glycol (MIRALAX) packet, Take 17 g by mouth daily., Disp: ,  Rfl:  .  pravastatin (PRAVACHOL) 40 MG tablet, TAKE 1 TABLET BY MOUTH AT BEDTIME, Disp: 90 tablet, Rfl: 0 .  predniSONE (DELTASONE) 20 MG tablet, Take 3 tabs qd x 3d, then 2 tabs qd x 3d then 1 tab qd x 3d., Disp: 18 tablet, Rfl: 0 .  ranitidine (ZANTAC) 150 MG capsule, Take 150 mg by mouth every evening. Take 2 tablets HS, Disp: , Rfl:  .  senna (SENOKOT) 8.6 MG TABS tablet, Take 2 tablets (17.2 mg total) by mouth at bedtime., Disp: 120 each, Rfl: 0 .  traMADol (ULTRAM) 50 MG tablet, Take by mouth every 6 (six) hours as needed for moderate pain., Disp: , Rfl:   Past Medical History: Past Medical History:  Diagnosis Date  . Allergy   . Arthritis   . Cancer [facility])    unsure at this time  . Cancer of lower lobe of right lung ([place])   . COPD (chronic obstructive pulmonary disease) ([place])    per patient's health survey - he put a ?  . Depression   . GERD (gastroesophageal reflux disease)   . Hypertension   . Non-small cell carcinoma of lung, right ([place]) 06/24/2015  . Shortness of breath   . Ulcer     Tobacco Use: History  Smoking Status  . Former Smoker  . Packs/day: 1.00  . Years: 35.00  . Types: Cigarettes  . Quit date: 09/24/2011  Smokeless Tobacco  . Never Used    Labs: Recent Review Flowsheet  Data    Labs for ITP Cardiac and Pulmonary Rehab Latest Ref Rng & Units 04/15/2015 05/20/2015 05/25/2015 08/26/2015   Cholestrol 125 - 200 mg/dL 260(H) - - 215(H)   LDLCALC <130 mg/dL 171(H) - - 124   HDL >=40 mg/dL 67 - - 64   Trlycerides <150 mg/dL 108 - - 134   PHART 7.350 - 7.450 - 7.414 7.393 -   PCO2ART 35.0 - 45.0 mmHg - 39.8 38.5 -   HCO3 20.0 - 24.0 mEq/L - 25.0(H) 23.0 -   TCO2 0 - 100 mmol/L - 26.2 24.1 -   ACIDBASEDEF 0.0 - 2.0 mmol/L - - 1.3 -   O2SAT % - 94.1 94.7 -      Capillary Blood Glucose: Lab Results  Component Value Date   GLUCAP 76 05/25/2015   GLUCAP 115 (H) 05/25/2015   GLUCAP 83 05/25/2015   GLUCAP 96 05/24/2015   GLUCAP 98 05/24/2015     ADL  UCSD:   Pulmonary Function Assessment:     Pulmonary Function Assessment - 08/02/15 1025      Breath   Bilateral Breath Sounds Clear   Shortness of Breath No      Exercise Target Goals:    Exercise Program Goal: Individual exercise prescription set with THRR, safety & activity barriers. Participant demonstrates ability to understand and report RPE using BORG scale, to self-measure pulse accurately, and to acknowledge the importance of the exercise prescription.  Exercise Prescription Goal: Starting with aerobic activity 30 plus minutes a day, 3 days per week for initial exercise prescription. Provide home exercise prescription and guidelines that participant acknowledges understanding prior to discharge.  Activity Barriers & Risk Stratification:     Activity Barriers & Cardiac Risk Stratification - 08/02/15 1023      Activity Barriers & Cardiac Risk Stratification   Activity Barriers Arthritis;Back Problems;Deconditioning;Muscular Weakness;Shortness of Breath;Incisional Pain;Right Knee Replacement      6 Minute Walk:     6 Minute Walk    Row Name 08/05/15 1646         6 Minute Walk   Phase Initial     Distance 1410 feet     Walk Time 6 minutes     # of Rest Breaks 0     MPH 2.67     METS 2.99     RPE 13     Perceived Dyspnea  1     Symptoms No     Resting HR 121 bpm     Resting BP 150/88     Max Ex. HR 121 bpm     Max Ex. BP 178/84     2 Minute Post BP 148/84       Interval HR   Baseline HR 89     1 Minute HR 97     2 Minute HR 94     3 Minute HR 89     4 Minute HR 88     5 Minute HR 87     6 Minute HR 87     2 Minute Post HR 96     Interval Heart Rate? Yes       Interval Oxygen   Baseline Oxygen Saturation % 96 %     Baseline Liters of Oxygen 0 L     1 Minute Oxygen Saturation % 97 %     1 Minute Liters of Oxygen 0 L     2 Minute Oxygen Saturation % 94 %     2  Minute Liters of Oxygen 0 L     3 Minute Oxygen Saturation % 89 %     3 Minute  Liters of Oxygen 0 L     4 Minute Oxygen Saturation % 88 %     4 Minute Liters of Oxygen 0 L     5 Minute Oxygen Saturation % 87 %     5 Minute Liters of Oxygen 0 L     6 Minute Oxygen Saturation % 87 %     6 Minute Liters of Oxygen 0 L     2 Minute Post Oxygen Saturation % 96 %     2 Minute Post Liters of Oxygen 0 L        Initial Exercise Prescription:     Initial Exercise Prescription - 08/05/15 1600      Date of Initial Exercise RX and Referring Provider   Date 08/05/15   Referring Provider Dr. Roxan Hockey     Treadmill   MPH 1.7   Grade 0   Minutes 17     NuStep   Level 2   Minutes 17   METs 1.5     Elliptical   Level 2   Speed 1   Minutes 17     Prescription Details   Frequency (times per week) 2   Duration Progress to 45 minutes of aerobic exercise without signs/symptoms of physical distress     Intensity   THRR 40-80% of Max Heartrate 62-125   Ratings of Perceived Exertion 11-13   Perceived Dyspnea 0-4     Progression   Progression Continue progressive overload as per policy without signs/symptoms or physical distress.     Resistance Training   Training Prescription Yes   Weight blue bands   Reps 10-12      Perform Capillary Blood Glucose checks as needed.  Exercise Prescription Changes:     Exercise Prescription Changes    Row Name 08/12/15 1500 08/17/15 1700 08/19/15 1600 08/24/15 1500 08/31/15 1500     Exercise Review   Progression  -  - Yes Yes Yes     Response to Exercise   Blood Pressure (Admit) 144/80 132/64 122/76 128/70 142/72   Blood Pressure (Exercise) 144/80 142/72 156/80 142/76 142/70   Blood Pressure (Exit) 116/60 132/80 120/72 118/84 110/70   Heart Rate (Admit) 90 bpm 97 bpm 87 bpm 92 bpm 104 bpm   Heart Rate (Exercise) 110 bpm 124 bpm 107 bpm 103 bpm 124 bpm   Heart Rate (Exit) 95 bpm 109 bpm 92 bpm 98 bpm 108 bpm   Oxygen Saturation (Admit) 97 % 96 % 95 % 95 % 96 %   Oxygen Saturation (Exercise) 91 % 94 % 91 % 91 % 90  %   Oxygen Saturation (Exit) 95 % 95 % 94 % 95 % 95 %   Rating of Perceived Exertion (Exercise) '13 13 11 13 13   '$ Perceived Dyspnea (Exercise) '1 1 1 2 1   '$ Duration Progress to 45 minutes of aerobic exercise without signs/symptoms of physical distress Progress to 45 minutes of aerobic exercise without signs/symptoms of physical distress Progress to 45 minutes of aerobic exercise without signs/symptoms of physical distress Progress to 45 minutes of aerobic exercise without signs/symptoms of physical distress Progress to 45 minutes of aerobic exercise without signs/symptoms of physical distress   Intensity THRR unchanged THRR unchanged THRR New THRR unchanged THRR unchanged     Progression   Progression Continue to progress workloads to maintain intensity  without signs/symptoms of physical distress. Continue to progress workloads to maintain intensity without signs/symptoms of physical distress. Continue to progress workloads to maintain intensity without signs/symptoms of physical distress. Continue to progress workloads to maintain intensity without signs/symptoms of physical distress. Continue to progress workloads to maintain intensity without signs/symptoms of physical distress.     Resistance Training   Training Prescription Yes Yes Yes Yes  -   Weight blue bands blue bands blue bands blue bands  -   Reps 10-12 10-12 10-12 10-12  10 minutes of strength training  -     Interval Training   Interval Training No No No No  -     Treadmill   MPH '2 2 2 2 '$ 2.5   Grade 0 0 0 0 3   Minutes '17 17 17 17 17     '$ Bike   Level  - 0.5  - 1 1   Minutes  - 17  - 17 17     NuStep   Level '2 2 3 4 4   '$ Minutes '17 17 17 17 17   '$ METs 2 2.6 2.5 3  -   Row Name 09/02/15 1528 09/07/15 1500 09/09/15 1500 09/16/15 1600 09/21/15 1500     Response to Exercise   Blood Pressure (Admit) 154/60 122/70 118/64 138/72 138/80   Blood Pressure (Exercise) 174/80 174/100 140/68 158/70 144/80   Blood Pressure (Exit)  134/80 122/70 124/76 132/76 116/70   Heart Rate (Admit) 98 bpm 90 bpm 97 bpm 100 bpm 101 bpm   Heart Rate (Exercise) 120 bpm 106 bpm 121 bpm 136 bpm 131 bpm   Heart Rate (Exit) 108 bpm 90 bpm 104 bpm 106 bpm 112 bpm   Oxygen Saturation (Admit) 95 % 94 % 95 % 96 % 95 %   Oxygen Saturation (Exercise) 87 % 87 %  desaturated to 86-87% on treadmill, decreased level, PLB 90 % 88 %  returned to 90 with rest quickly, will place on 2l of oxygen 90 %   Oxygen Saturation (Exit) 93 % 97 % 97 % 97 % 97 %   Rating of Perceived Exertion (Exercise) '13 13 13 13 13   '$ Perceived Dyspnea (Exercise) '1 1 1 1 1   '$ Duration Progress to 45 minutes of aerobic exercise without signs/symptoms of physical distress Progress to 45 minutes of aerobic exercise without signs/symptoms of physical distress Progress to 45 minutes of aerobic exercise without signs/symptoms of physical distress Progress to 45 minutes of aerobic exercise without signs/symptoms of physical distress Progress to 45 minutes of aerobic exercise without signs/symptoms of physical distress   Intensity THRR unchanged THRR unchanged THRR unchanged THRR unchanged THRR unchanged     Progression   Progression Continue to progress workloads to maintain intensity without signs/symptoms of physical distress. Continue to progress workloads to maintain intensity without signs/symptoms of physical distress. Continue to progress workloads to maintain intensity without signs/symptoms of physical distress. Continue to progress workloads to maintain intensity without signs/symptoms of physical distress. Continue to progress workloads to maintain intensity without signs/symptoms of physical distress.     Resistance Training   Training Prescription Yes Yes Yes Yes Yes   Weight blue bands blue bands blue bands blue bands blue bands   Reps 10-12  10 min strength training 10-12  10 minutes of strength training 10-12  10 minutes of strength training 10-12  10 minutes of  strength training 10-12  10 minutes of strength training     Interval Training  Interval Training  - No No No No     Oxygen   Oxygen  -  -  -  - Continuous   Liters  -  -  -  - 2     Treadmill   MPH  - 2 2.5  tolerated speed better, did not desaturate today 2.5 2.5   Grade  - '1 3 3 3   '$ Minutes  - '17 17 17 17     '$ Bike   Level 1  -  - 1  -   Minutes 17  -  - 17  -     Recumbant Bike   Level  -  -  -  - 4   Minutes  -  -  -  - 17     NuStep   Level '4 4 4  '$ - 4   Minutes '17 17 17  '$ - 17   METs 3.1 2 2.9  - 3.2     Home Exercise Plan   Plans to continue exercise at  - Home  -  -  -   Frequency  - Add 3 additional days to program exercise sessions.  -  -  -   Row Name 09/23/15 1500 09/28/15 1500 09/30/15 1600 10/05/15 1500 10/07/15 1500     Exercise Review   Progression  -  -  -  - Yes     Response to Exercise   Blood Pressure (Admit) 144/74 100/80 136/74 128/72 144/80   Blood Pressure (Exercise) 154/86 130/74 124/80 160/86 126/90   Blood Pressure (Exit) 118/70 154/80  recheck BP 142/84 after more rest 134/70 114/62 130/70   Heart Rate (Admit) 96 bpm 105 bpm 101 bpm 98 bpm 95 bpm   Heart Rate (Exercise) 115 bpm 131 bpm 116 bpm 111 bpm 116 bpm   Heart Rate (Exit) 93 bpm 114 bpm 110 bpm 110 bpm 108 bpm   Oxygen Saturation (Admit) 96 % 97 % 89 % 95 % 92 %   Oxygen Saturation (Exercise) 93 % 91 % 90 % 89 % 91 %   Oxygen Saturation (Exit) 97 % 97 % 97 % 95 % 98 %   Rating of Perceived Exertion (Exercise) '13 11 11 13 13   '$ Perceived Dyspnea (Exercise) '1 1 1 1 1   '$ Duration Progress to 45 minutes of aerobic exercise without signs/symptoms of physical distress Progress to 45 minutes of aerobic exercise without signs/symptoms of physical distress Progress to 45 minutes of aerobic exercise without signs/symptoms of physical distress Progress to 45 minutes of aerobic exercise without signs/symptoms of physical distress Progress to 45 minutes of aerobic exercise without signs/symptoms of  physical distress   Intensity THRR unchanged THRR unchanged THRR unchanged THRR unchanged THRR unchanged     Progression   Progression Continue to progress workloads to maintain intensity without signs/symptoms of physical distress. Continue to progress workloads to maintain intensity without signs/symptoms of physical distress. Continue to progress workloads to maintain intensity without signs/symptoms of physical distress. Continue to progress workloads to maintain intensity without signs/symptoms of physical distress. Continue to progress workloads to maintain intensity without signs/symptoms of physical distress.     Resistance Training   Training Prescription Yes Yes Yes Yes Yes   Weight blue bands blue bands blue bands blue bands blue bands   Reps 10-12  10 minutes of strength training 10-12  10 minutes of strength training 10-12  10 minutes of strength  training 10-12  10 minutes of strength  training 10-12  10 minutes of strength training     Interval Training   Interval Training No No No Yes Yes     Oxygen   Oxygen Continuous Continuous Continuous Continuous Continuous   Liters '2 2 2 2 2     '$ Treadmill   MPH 2.5 2.5 2.5 2.5 2.5   Grade '3 3 3 3 3   '$ Minutes '17 17 17 17 17     '$ Recumbant Bike   Level  - 2  reducted to 2 due to knee pain  - 2  -   Minutes  - 17  - 17  -     NuStep   Level '4 4 4 4 5   '$ Minutes '17 17 17 17 17   '$ METs 3.1 3  - 3.3 2.6   Row Name 10/12/15 1500 10/14/15 1500 10/19/15 1520         Response to Exercise   Blood Pressure (Admit) 132/74 110/60 138/84     Blood Pressure (Exercise) 176/80 164/84 160/82     Blood Pressure (Exit) 120/74 124/70 120/66     Heart Rate (Admit) 93 bpm 86 bpm 82 bpm     Heart Rate (Exercise) 112 bpm 116 bpm 103 bpm     Heart Rate (Exit) 97 bpm 90 bpm 95 bpm     Oxygen Saturation (Admit) 97 % 96 % 95 %     Oxygen Saturation (Exercise) 95 % 94 % 90 %     Oxygen Saturation (Exit) 99 % 97 % 98 %     Rating of Perceived  Exertion (Exercise) '13 13 13     '$ Perceived Dyspnea (Exercise) '1 1 2     '$ Duration Progress to 45 minutes of aerobic exercise without signs/symptoms of physical distress Progress to 45 minutes of aerobic exercise without signs/symptoms of physical distress Progress to 45 minutes of aerobic exercise without signs/symptoms of physical distress     Intensity THRR unchanged THRR unchanged THRR unchanged       Progression   Progression Continue to progress workloads to maintain intensity without signs/symptoms of physical distress. Continue to progress workloads to maintain intensity without signs/symptoms of physical distress. Continue to progress workloads to maintain intensity without signs/symptoms of physical distress.       Resistance Training   Training Prescription Yes Yes Yes     Weight blue bands blue bands blue bands     Reps 10-12  10 minutes of strength training 10-12  10 minutes of strength training 10-12  10 minutes of strength training       Interval Training   Interval Training Yes Yes Yes       Oxygen   Oxygen Continuous Continuous Continuous     Liters '2 2 2       '$ Treadmill   MPH 2.5 2.5 2.5     Grade '3 3 3     '$ Minutes '17 17 17       '$ Recumbant Bike   Level 4  - 4     Minutes 17  - 17       NuStep   Level '5 5 5     '$ Minutes '17 17 17     '$ METs 3.6 3.6 3.6        Exercise Comments:     Exercise Comments    Row Name 08/19/15 0923 09/07/15 1538 09/16/15 0852 10/18/15 1400     Exercise Comments Just started program, has exercised 2 sessions, tolerating well  Completed home exercise prescription today Patient is progressing well in program. Oxygen saturations are sometimes borderline. Will continue to progress and monitor.  Patient is progressing well in program. Is not doing HIIT on all three stations. Very motivated to make changes. Having some sciatica pain. Is going to schedule appointment with ortho.       Discharge Exercise Prescription (Final Exercise  Prescription Changes):     Exercise Prescription Changes - 10/19/15 1520      Response to Exercise   Blood Pressure (Admit) 138/84   Blood Pressure (Exercise) 160/82   Blood Pressure (Exit) 120/66   Heart Rate (Admit) 82 bpm   Heart Rate (Exercise) 103 bpm   Heart Rate (Exit) 95 bpm   Oxygen Saturation (Admit) 95 %   Oxygen Saturation (Exercise) 90 %   Oxygen Saturation (Exit) 98 %   Rating of Perceived Exertion (Exercise) 13   Perceived Dyspnea (Exercise) 2   Duration Progress to 45 minutes of aerobic exercise without signs/symptoms of physical distress   Intensity THRR unchanged     Progression   Progression Continue to progress workloads to maintain intensity without signs/symptoms of physical distress.     Resistance Training   Training Prescription Yes   Weight blue bands   Reps 10-12  10 minutes of strength training     Interval Training   Interval Training Yes     Oxygen   Oxygen Continuous   Liters 2     Treadmill   MPH 2.5   Grade 3   Minutes 17     Recumbant Bike   Level 4   Minutes 17     NuStep   Level 5   Minutes 17   METs 3.6       Nutrition:  Target Goals: Understanding of nutrition guidelines, daily intake of sodium '1500mg'$ , cholesterol '200mg'$ , calories 30% from fat and 7% or less from saturated fats, daily to have 5 or more servings of fruits and vegetables.  Biometrics:     Pre Biometrics - 08/02/15 1032      Pre Biometrics   Grip Strength 50 kg       Nutrition Therapy Plan and Nutrition Goals:     Nutrition Therapy & Goals - 09/07/15 1523      Nutrition Therapy   Diet Therapeutic Lifestyle Changes     Personal Nutrition Goals   Personal Goal #1 Maintain wt while in Pulmonary Rehab     Intervention Plan   Intervention Prescribe, educate and counsel regarding individualized specific dietary modifications aiming towards targeted core components such as weight, hypertension, lipid management, diabetes, heart failure and  other comorbidities.   Expected Outcomes Short Term Goal: Understand basic principles of dietary content, such as calories, fat, sodium, cholesterol and nutrients.;Long Term Goal: Adherence to prescribed nutrition plan.      Nutrition Discharge: Rate Your Plate Scores:     Nutrition Assessments - 09/07/15 1523      Rate Your Plate Scores   Pre Score 50      Psychosocial: Target Goals: Acknowledge presence or absence of depression, maximize coping skills, provide positive support system. Participant is able to verbalize types and ability to use techniques and skills needed for reducing stress and depression.  Initial Review & Psychosocial Screening:     Initial Psych Review & Screening - 08/02/15 [place]? Yes     Barriers   Psychosocial barriers to participate in program  There are no identifiable barriers or psychosocial needs.     Screening Interventions   Interventions Encouraged to exercise      Quality of Life Scores:   PHQ-9: Recent Review Flowsheet Data    Depression screen [facility] 2/9 08/02/2015 06/24/2015 04/15/2015 01/15/2015 10/20/2014   Decreased Interest 0 0 0 0 0   Down, Depressed, Hopeless 0 1 2 0 0   PHQ - 2 Score 0 1 2 0 0   Altered sleeping - - 2 - -   Tired, decreased energy - - 3 - -   Change in appetite - - 0 - -   Feeling bad or failure about yourself  - - 3 - -   Trouble concentrating - - 0 - -   Moving slowly or fidgety/restless - - 1 - -   Suicidal thoughts - - 1 - -   PHQ-9 Score - - 12 - -      Psychosocial Evaluation and Intervention:     Psychosocial Evaluation - 08/02/15 1036      Psychosocial Evaluation & Interventions   Interventions Encouraged to exercise with the program and follow exercise prescription   Continued Psychosocial Services Needed No      Psychosocial Re-Evaluation:     Psychosocial Re-Evaluation    Row Name 08/16/15 1357 09/13/15 1551 10/14/15 0847         Psychosocial  Re-Evaluation   Interventions Encouraged to attend Pulmonary Rehabilitation for the exercise Encouraged to attend Pulmonary Rehabilitation for the exercise Encouraged to attend Pulmonary Rehabilitation for the exercise     Comments No psychosocial issues identified at this time. -  no concerns at this time No psychosocial concerns identified     Continued Psychosocial Services Needed No No No       Education: Education Goals: Education classes will be provided on a weekly basis, covering required topics. Participant will state understanding/return demonstration of topics presented.  Learning Barriers/Preferences:     Learning Barriers/Preferences - 08/02/15 1024      Learning Barriers/Preferences   Learning Barriers None   Learning Preferences Skilled Demonstration;Group Instruction      Education Topics: Risk Factor Reduction:  -Group instruction that is supported by a PowerPoint presentation. Instructor discusses the definition of a risk factor, different risk factors for pulmonary disease, and how the heart and lungs work together.     Nutrition for Pulmonary Patient:  -Group instruction provided by PowerPoint slides, verbal discussion, and written materials to support subject matter. The instructor gives an explanation and review of healthy diet recommendations, which includes a discussion on weight management, recommendations for fruit and vegetable consumption, as well as protein, fluid, caffeine, fiber, sodium, sugar, and alcohol. Tips for eating when patients are short of breath are discussed. Flowsheet Row PULMONARY REHAB OTHER RESPIRATORY from 10/07/2015 in [place]  Date  09/16/15  Educator  RD  Instruction Review Code  2- meets goals/outcomes      Pursed Lip Breathing:  -Group instruction that is supported by demonstration and informational handouts. Instructor discusses the benefits of pursed lip and diaphragmatic breathing and  detailed demonstration on how to preform both.   Flowsheet Row PULMONARY REHAB OTHER RESPIRATORY from 10/07/2015 in [place]  Date  09/02/15  Educator  EP  Instruction Review Code  2- meets goals/outcomes      Oxygen Safety:  -Group instruction provided by PowerPoint, verbal discussion, and written material to support subject matter. There is  an overview of "What is Oxygen" and "Why do we need it".  Instructor also reviews how to create a safe environment for oxygen use, the importance of using oxygen as prescribed, and the risks of noncompliance. There is a brief discussion on traveling with oxygen and resources the patient may utilize. Flowsheet Row PULMONARY REHAB OTHER RESPIRATORY from 10/07/2015 in [place]  Date  09/30/15  Educator  rn  Instruction Review Code  2- meets goals/outcomes      Oxygen Equipment:  -Group instruction provided by [employer] Staff utilizing handouts, written materials, and equipment demonstrations.   Signs and Symptoms:  -Group instruction provided by written material and verbal discussion to support subject matter. Warning signs and symptoms of infection, stroke, and heart attack are reviewed and when to call the physician/911 reinforced. Tips for preventing the spread of infection discussed. Flowsheet Row PULMONARY REHAB OTHER RESPIRATORY from 10/07/2015 in [place]  Date  08/19/15  Educator  Remo Lipps, RN  Instruction Review Code  2- meets goals/outcomes      Advanced Directives:  -Group instruction provided by verbal instruction and written material to support subject matter. Instructor reviews Advanced Directive laws and proper instruction for filling out document.   Pulmonary Video:  -Group video education that reviews the importance of medication and oxygen compliance, exercise, good nutrition, pulmonary hygiene, and pursed lip and diaphragmatic  breathing for the pulmonary patient. Flowsheet Row PULMONARY REHAB OTHER RESPIRATORY from 10/07/2015 in [place]  Date  10/07/15  Instruction Review Code  2- meets goals/outcomes      Exercise for the Pulmonary Patient:  -Group instruction that is supported by a PowerPoint presentation. Instructor discusses benefits of exercise, core components of exercise, frequency, duration, and intensity of an exercise routine, importance of utilizing pulse oximetry during exercise, safety while exercising, and options of places to exercise outside of rehab.   Flowsheet Row PULMONARY REHAB OTHER RESPIRATORY from 10/07/2015 in [place]  Date  08/12/15  Educator  EP  Instruction Review Code  2- meets goals/outcomes      Pulmonary Medications:  -Verbally interactive group education provided by instructor with focus on inhaled medications and proper administration.   Anatomy and Physiology of the Respiratory System and Intimacy:  -Group instruction provided by PowerPoint, verbal discussion, and written material to support subject matter. Instructor reviews respiratory cycle and anatomical components of the respiratory system and their functions. Instructor also reviews differences in obstructive and restrictive respiratory diseases with examples of each. Intimacy, Sex, and Sexuality differences are reviewed with a discussion on how relationships can change when diagnosed with pulmonary disease. Common sexual concerns are reviewed.   Knowledge Questionnaire Score:   Core Components/Risk Factors/Patient Goals at Admission:     Personal Goals and Risk Factors at Admission - 08/02/15 1033      Core Components/Risk Factors/Patient Goals on Admission   Increase Strength and Stamina Yes   Intervention Provide advice, education, support and counseling about physical activity/exercise needs.;Develop an individualized exercise prescription for  aerobic and resistive training based on initial evaluation findings, risk stratification, comorbidities and participant's personal goals.   Expected Outcomes Achievement of increased cardiorespiratory fitness and enhanced flexibility, muscular endurance and strength shown through measurements of functional capacity and personal statement of participant.   Improve shortness of breath with ADL's Yes   Intervention Provide education, individualized exercise plan and daily activity instruction to help decrease symptoms of  SOB with activities of daily living.   Expected Outcomes Short Term: Achieves a reduction of symptoms when performing activities of daily living.      Core Components/Risk Factors/Patient Goals Review:      Goals and Risk Factor Review    Row Name 08/02/15 1034 08/16/15 1351 09/13/15 1549 10/14/15 0845       Core Components/Risk Factors/Patient Goals Review   Personal Goals Review Increase Strength and Stamina;Improve shortness of breath with ADL's Increase Strength and Stamina;Improve shortness of breath with ADL's  - Improve shortness of breath with ADL's;Increase Strength and Stamina    Review Increase strength and stamina by increasing exercise has only exercised 3 sessions with [place], too early to see an improvement attended 8 exercise sessions, increasing worloads and tolerating well Is participating in high intensity interval training and doing well    Expected Outcomes Improved strength and stamina as before lung surgery Should see an increase in strength and stamina in the next 30 days Improved strength and stamina Continue to increase workloads as tolerated       Core Components/Risk Factors/Patient Goals at Discharge (Final Review):      Goals and Risk Factor Review - 10/14/15 0845      Core Components/Risk Factors/Patient Goals Review   Personal Goals Review Improve shortness of breath with ADL's;Increase Strength and Stamina   Review Is participating in high  intensity interval training and doing well   Expected Outcomes Continue to increase workloads as tolerated      ITP Comments:   Comments: ITP REVIEW Pt is making expected progress toward pulmonary rehab goals after completing 18 sessions. Recommend continued exercise, life style modification, education, and utilization of breathing techniques to increase stamina and strength and decrease shortness of breath with exertion.  *

## 2015-10-26 ENCOUNTER — Encounter (HOSPITAL_COMMUNITY)
Admission: RE | Admit: 2015-10-26 | Discharge: 2015-10-26 | Disposition: A | Discharge status: Home / Self Care | Payer: Commercial Managed Care - HMO | Source: Ambulatory Visit | Attending: Thoracic Surgery (Cardiothoracic Vascular Surgery) | Admitting: Thoracic Surgery (Cardiothoracic Vascular Surgery)

## 2015-10-26 VITALS — Wt 180.8 lb

## 2015-10-26 DIAGNOSIS — C3431 Malignant neoplasm of lower lobe, right bronchus or lung: Secondary | ICD-10-CM

## 2015-10-26 DIAGNOSIS — J449 Chronic obstructive pulmonary disease, unspecified: Secondary | ICD-10-CM | POA: Diagnosis not present

## 2015-10-26 NOTE — Progress Notes (Signed)
Daily Session Note  Patient Details  Name: Elijah Marshall MRN: [id number] Date of Birth: [birth year] Referring Provider:   April Manson Pulmonary Rehab Walk Test from 08/05/2015 in [place]  Referring Provider  Dr. Roxan Hockey      Encounter Date: 10/26/2015  Check In:     Session Check In - 10/26/15 1347      Check-In   Location [facility]   Staff Present Su Hilt, MS, ACSM RCEP, Exercise Physiologist;Joan Leonia Reeves, RN, BSN;Zalaya Astarita, RN;Portia Rollene Rotunda, RN, BSN   Supervising physician immediately available to respond to emergencies [facility] immediately available   Physician(s) Dr. Landis Gandy   Medication changes reported     No   Fall or balance concerns reported    No   Warm-up and Cool-down Performed as group-led instruction   Resistance Training Performed Yes   VAD Patient? No     Pain Assessment   Currently in Pain? No/denies   Multiple Pain Sites No      Capillary Blood Glucose: No results found for this or any previous visit (from the past 24 hour(s)).      Exercise Prescription Changes - 10/26/15 1500      Response to Exercise   Blood Pressure (Admit) 140/72   Blood Pressure (Exercise) 142/70   Blood Pressure (Exit) 118/60   Heart Rate (Admit) 104 bpm   Heart Rate (Exercise) 113 bpm   Heart Rate (Exit) 101 bpm   Oxygen Saturation (Admit) 96 %   Oxygen Saturation (Exercise) 85 %  Slowly increased to 91 with rest   Oxygen Saturation (Exit) 94 %   Rating of Perceived Exertion (Exercise) 13   Perceived Dyspnea (Exercise) 1   Duration Progress to 45 minutes of aerobic exercise without signs/symptoms of physical distress   Intensity THRR unchanged     Progression   Progression Continue to progress workloads to maintain intensity without signs/symptoms of physical distress.     Resistance Training   Training Prescription Yes   Weight blue bands   Reps 10-12  10 minutes of strength  training     Interval Training   Interval Training No  Pt is having more sciatic pain     Oxygen   Oxygen Continuous   Liters 2     Treadmill   MPH 2.5   Grade 3   Minutes 17     Recumbant Bike   Level 4   Minutes 17     NuStep   Level 5   Minutes 17   METs 3.2     Goals Met:  Exercise tolerated well No report of cardiac concerns or symptoms Strength training completed today  Goals Unmet:  Not Applicable  Comments: Service time is from 1330 to 1500    Dr. Rush Farmer is Medical Director for Pulmonary Rehab at [facility].

## 2015-10-27 ENCOUNTER — Other Ambulatory Visit: Payer: Self-pay | Admitting: Internal Medicine

## 2015-10-28 ENCOUNTER — Encounter (HOSPITAL_COMMUNITY)
Admission: RE | Admit: 2015-10-28 | Discharge: 2015-10-28 | Disposition: A | Discharge status: Home / Self Care | Payer: Commercial Managed Care - HMO | Source: Ambulatory Visit | Attending: Thoracic Surgery (Cardiothoracic Vascular Surgery) | Admitting: Thoracic Surgery (Cardiothoracic Vascular Surgery)

## 2015-10-28 VITALS — Wt 181.4 lb

## 2015-10-28 DIAGNOSIS — C3431 Malignant neoplasm of lower lobe, right bronchus or lung: Secondary | ICD-10-CM

## 2015-10-28 DIAGNOSIS — J449 Chronic obstructive pulmonary disease, unspecified: Secondary | ICD-10-CM | POA: Diagnosis not present

## 2015-10-28 NOTE — Progress Notes (Signed)
Daily Session Note  Patient Details  Name: Elijah Marshall MRN: [id number] Date of Birth: 1950/03/01 Referring Provider:   April Manson Pulmonary Rehab Walk Test from 08/05/2015 in [place]  Referring Provider  Dr. Roxan Hockey      Encounter Date: 10/28/2015  Check In:     Session Check In - 10/28/15 1330      Check-In   Location [facility]   Staff Present Rosebud Poles, RN, BSN;Molly diVincenzo, MS, ACSM RCEP, Exercise Physiologist;Lisa Ysidro Evert, Felipe Drone, RN, MHA;Malia Corsi Rollene Rotunda, RN, BSN   Supervising physician immediately available to respond to emergencies [facility] immediately available   Physician(s) dR. [email or web address]   Medication changes reported     No   Fall or balance concerns reported    No   Warm-up and Cool-down Performed as group-led instruction   Resistance Training Performed Yes   VAD Patient? No     Pain Assessment   Currently in Pain? No/denies   Multiple Pain Sites No      Capillary Blood Glucose: No results found for this or any previous visit (from the past 24 hour(s)).      Exercise Prescription Changes - 10/28/15 1552      Response to Exercise   Blood Pressure (Exercise) 160/90   Blood Pressure (Exit) 114/68   Heart Rate (Admit) 93 bpm   Heart Rate (Exercise) 107 bpm   Heart Rate (Exit) 103 bpm   Oxygen Saturation (Admit) 94 %   Oxygen Saturation (Exercise) 92 %   Oxygen Saturation (Exit) 96 %   Rating of Perceived Exertion (Exercise) 13   Perceived Dyspnea (Exercise) 1   Duration Progress to 45 minutes of aerobic exercise without signs/symptoms of physical distress   Intensity THRR unchanged     Progression   Progression Continue to progress workloads to maintain intensity without signs/symptoms of physical distress.     Resistance Training   Training Prescription Yes   Weight blue bands   Reps 10-12  10 minutes of strength training     Interval Training   Interval  Training No  Pt is having more sciatic pain     Oxygen   Oxygen Continuous   Liters 2     Recumbant Bike   Level 4   Minutes 17     NuStep   Level 5   Minutes 17   METs 3.6     Goals Met:  Improved SOB with ADL's Using PLB without cueing & demonstrates good technique Exercise tolerated well No report of cardiac concerns or symptoms Strength training completed today  Goals Unmet:  Not Applicable  Comments: Service time is from 1330 to 1515   Dr. Rush Farmer is Medical Director for Pulmonary Rehab at [facility].

## 2015-11-02 ENCOUNTER — Encounter (HOSPITAL_COMMUNITY)
Admission: RE | Admit: 2015-11-02 | Discharge: 2015-11-02 | Disposition: A | Discharge status: Home / Self Care | Payer: Commercial Managed Care - HMO | Source: Ambulatory Visit | Attending: Thoracic Surgery (Cardiothoracic Vascular Surgery) | Admitting: Thoracic Surgery (Cardiothoracic Vascular Surgery)

## 2015-11-02 VITALS — Wt 183.6 lb

## 2015-11-02 DIAGNOSIS — C3431 Malignant neoplasm of lower lobe, right bronchus or lung: Secondary | ICD-10-CM

## 2015-11-02 DIAGNOSIS — J449 Chronic obstructive pulmonary disease, unspecified: Secondary | ICD-10-CM | POA: Diagnosis not present

## 2015-11-02 NOTE — Progress Notes (Signed)
Daily Session Note  Patient Details  Name: Elijah Marshall MRN: [id number] Date of Birth: Dec 01, 1950 Referring Provider:   April Manson Pulmonary Rehab Walk Test from 08/05/2015 in [place]  Referring Provider  Dr. Roxan Hockey      Encounter Date: 11/02/2015  Check In:     Session Check In - 11/02/15 1327      Check-In   [facility]   Staff Present Rosebud Poles, RN, BSN;Molly diVincenzo, MS, ACSM RCEP, Exercise Physiologist;Lisa Ysidro Evert, RN;Eloni Darius Rollene Rotunda, RN, BSN   Supervising physician immediately available to respond to emergencies [facility] immediately available   Physician(s) Dr. Dyann Kief   Medication changes reported     No   Fall or balance concerns reported    No   Warm-up and Cool-down Performed as group-led instruction   Resistance Training Performed Yes   VAD Patient? No     Pain Assessment   Currently in Pain? No/denies   Multiple Pain Sites No      Capillary Blood Glucose: No results found for this or any previous visit (from the past 24 hour(s)).      Exercise Prescription Changes - 11/02/15 1515      Response to Exercise   Blood Pressure (Admit) 126/72   Blood Pressure (Exercise) 164/66   Blood Pressure (Exit) 130/78   Heart Rate (Admit) 90 bpm   Heart Rate (Exercise) 112 bpm   Heart Rate (Exit) 98 bpm   Oxygen Saturation (Admit) 95 %   Oxygen Saturation (Exercise) 86 %   Oxygen Saturation (Exit) 93 %   Rating of Perceived Exertion (Exercise) 13   Perceived Dyspnea (Exercise) 3   Duration Progress to 45 minutes of aerobic exercise without signs/symptoms of physical distress   Intensity THRR unchanged     Progression   Progression Continue to progress workloads to maintain intensity without signs/symptoms of physical distress.     Resistance Training   Training Prescription Yes   Weight blue bands   Reps 10-12  10 minutes of strength training     Interval Training   Interval  Training No  Pt is having more sciatic pain     Oxygen   Oxygen Continuous   Liters 2     Treadmill   MPH 2.5   Grade 3   Minutes 17     Recumbant Bike   Level 5   Minutes 17     NuStep   Level 5   Minutes 17   METs 3.5     Goals Met:  Independence with exercise equipment Improved SOB with ADL's Using PLB without cueing & demonstrates good technique Exercise tolerated well No report of cardiac concerns or symptoms Strength training completed today  Goals Unmet:  Not Applicable  Comments: Service time is from 1330 to 1500   Dr. Rush Farmer is Medical Director for Pulmonary Rehab at [facility].

## 2015-11-04 ENCOUNTER — Encounter (HOSPITAL_COMMUNITY)
Admission: RE | Admit: 2015-11-04 | Discharge: 2015-11-04 | Disposition: A | Discharge status: Home / Self Care | Payer: Commercial Managed Care - HMO | Source: Ambulatory Visit | Attending: Thoracic Surgery (Cardiothoracic Vascular Surgery) | Admitting: Thoracic Surgery (Cardiothoracic Vascular Surgery)

## 2015-11-04 VITALS — Wt 181.2 lb

## 2015-11-04 DIAGNOSIS — C3431 Malignant neoplasm of lower lobe, right bronchus or lung: Secondary | ICD-10-CM

## 2015-11-04 DIAGNOSIS — J449 Chronic obstructive pulmonary disease, unspecified: Secondary | ICD-10-CM | POA: Diagnosis not present

## 2015-11-04 NOTE — Progress Notes (Signed)
Daily Session Note  Patient Details  Name: NIKE SOUTHWELL MRN: [id number] Date of Birth: 03/29/1950 Referring Provider:   April Manson Pulmonary Rehab Walk Test from 08/05/2015 in [place]  Referring Provider  Dr. Roxan Hockey      Encounter Date: 11/04/2015  Check In:     Session Check In - 11/04/15 1346      Check-In   Location [facility]   Staff Present Rosebud Poles, RN, BSN;Molly diVincenzo, MS, ACSM RCEP, Exercise Physiologist;Janson Lamar Ysidro Evert, RN;[place]ia Rollene Rotunda, RN, BSN   Supervising physician immediately available to respond to emergencies [facility] immediately available   Physician(s) Dr. Cathlean Sauer   Medication changes reported     No   Fall or balance concerns reported    No   Warm-up and Cool-down Performed as group-led instruction   Resistance Training Performed Yes   VAD Patient? No     Pain Assessment   Currently in Pain? No/denies   Multiple Pain Sites No      Capillary Blood Glucose: No results found for this or any previous visit (from the past 24 hour(s)).      Exercise Prescription Changes - 11/04/15 1500      Response to Exercise   Blood Pressure (Admit) 132/60   Blood Pressure (Exercise) 150/80   Blood Pressure (Exit) 138/70   Heart Rate (Admit) 78 bpm   Heart Rate (Exercise) 106 bpm   Heart Rate (Exit) 95 bpm   Oxygen Saturation (Admit) 95 %   Oxygen Saturation (Exercise) 84 %   Oxygen Saturation (Exit) 95 %   Rating of Perceived Exertion (Exercise) 13   Perceived Dyspnea (Exercise) 2   Duration Progress to 45 minutes of aerobic exercise without signs/symptoms of physical distress   Intensity THRR unchanged     Progression   Progression Continue to progress workloads to maintain intensity without signs/symptoms of physical distress.     Resistance Training   Training Prescription Yes   Weight blue bands   Reps 10-12  10 minutes of strength training     Interval Training   Interval  Training Yes     Oxygen   Oxygen Continuous   Liters 2     Treadmill   MPH 2.4   Grade 3   Minutes 17     NuStep   Level 5   Minutes 17   METs 3.6     Goals Met:  Exercise tolerated well No report of cardiac concerns or symptoms Strength training completed today  Goals Unmet:  Not Applicable  Comments: Service time is from 1330 to 1515    Dr. Rush Farmer is Medical Director for Pulmonary Rehab at [facility].

## 2015-11-05 ENCOUNTER — Other Ambulatory Visit: Payer: Self-pay

## 2015-11-05 NOTE — Telephone Encounter (Signed)
Pt would like a refill on his HYDROcodone-acetaminophen (NORCO/VICODIN) 5-325 MG tablet [[id number]]. He is having a lot of pain in his hip area. Please advise at [phone]

## 2015-11-08 NOTE — Telephone Encounter (Signed)
Last RF 5/18. Last OV in July. Pended.

## 2015-11-09 ENCOUNTER — Encounter (HOSPITAL_COMMUNITY)
Admission: RE | Admit: 2015-11-09 | Discharge: 2015-11-09 | Disposition: A | Discharge status: Home / Self Care | Payer: Commercial Managed Care - HMO | Source: Ambulatory Visit | Attending: Thoracic Surgery (Cardiothoracic Vascular Surgery) | Admitting: Thoracic Surgery (Cardiothoracic Vascular Surgery)

## 2015-11-09 ENCOUNTER — Ambulatory Visit: Payer: Managed Care, Other (non HMO) | Admitting: Internal Medicine

## 2015-11-09 DIAGNOSIS — Z7952 Long term (current) use of systemic steroids: Secondary | ICD-10-CM | POA: Insufficient documentation

## 2015-11-09 DIAGNOSIS — Z7951 Long term (current) use of inhaled steroids: Secondary | ICD-10-CM | POA: Insufficient documentation

## 2015-11-09 DIAGNOSIS — Z87891 Personal history of nicotine dependence: Secondary | ICD-10-CM | POA: Diagnosis not present

## 2015-11-09 DIAGNOSIS — Z79899 Other long term (current) drug therapy: Secondary | ICD-10-CM | POA: Diagnosis not present

## 2015-11-09 DIAGNOSIS — I10 Essential (primary) hypertension: Secondary | ICD-10-CM | POA: Diagnosis not present

## 2015-11-09 DIAGNOSIS — J449 Chronic obstructive pulmonary disease, unspecified: Secondary | ICD-10-CM | POA: Insufficient documentation

## 2015-11-09 DIAGNOSIS — M1990 Unspecified osteoarthritis, unspecified site: Secondary | ICD-10-CM | POA: Diagnosis not present

## 2015-11-09 DIAGNOSIS — C3431 Malignant neoplasm of lower lobe, right bronchus or lung: Secondary | ICD-10-CM | POA: Insufficient documentation

## 2015-11-09 DIAGNOSIS — F329 Major depressive disorder, single episode, unspecified: Secondary | ICD-10-CM | POA: Insufficient documentation

## 2015-11-09 DIAGNOSIS — K219 Gastro-esophageal reflux disease without esophagitis: Secondary | ICD-10-CM | POA: Insufficient documentation

## 2015-11-11 ENCOUNTER — Telehealth: Payer: Self-pay | Admitting: Internal Medicine

## 2015-11-11 ENCOUNTER — Encounter: Payer: Self-pay | Admitting: Family Medicine

## 2015-11-11 ENCOUNTER — Encounter (HOSPITAL_COMMUNITY): Admission: RE | Admit: 2015-11-11 | Payer: Commercial Managed Care - HMO | Source: Ambulatory Visit

## 2015-11-11 ENCOUNTER — Ambulatory Visit (INDEPENDENT_AMBULATORY_CARE_PROVIDER_SITE_OTHER): Payer: Commercial Managed Care - HMO | Admitting: Family Medicine

## 2015-11-11 VITALS — BP 154/102 | HR 92 | Temp 98.6°F | Resp 16 | Ht 67.0 in | Wt 180.0 lb

## 2015-11-11 DIAGNOSIS — J449 Chronic obstructive pulmonary disease, unspecified: Secondary | ICD-10-CM | POA: Diagnosis not present

## 2015-11-11 DIAGNOSIS — C3491 Malignant neoplasm of unspecified part of right bronchus or lung: Secondary | ICD-10-CM | POA: Diagnosis not present

## 2015-11-11 DIAGNOSIS — I10 Essential (primary) hypertension: Secondary | ICD-10-CM | POA: Diagnosis not present

## 2015-11-11 DIAGNOSIS — M1990 Unspecified osteoarthritis, unspecified site: Secondary | ICD-10-CM | POA: Diagnosis not present

## 2015-11-11 DIAGNOSIS — F329 Major depressive disorder, single episode, unspecified: Secondary | ICD-10-CM

## 2015-11-11 DIAGNOSIS — E785 Hyperlipidemia, unspecified: Secondary | ICD-10-CM

## 2015-11-11 DIAGNOSIS — J310 Chronic rhinitis: Secondary | ICD-10-CM | POA: Diagnosis not present

## 2015-11-11 DIAGNOSIS — M1612 Unilateral primary osteoarthritis, left hip: Secondary | ICD-10-CM

## 2015-11-11 DIAGNOSIS — F32A Depression, unspecified: Secondary | ICD-10-CM | POA: Insufficient documentation

## 2015-11-11 DIAGNOSIS — K219 Gastro-esophageal reflux disease without esophagitis: Secondary | ICD-10-CM

## 2015-11-11 DIAGNOSIS — J329 Chronic sinusitis, unspecified: Secondary | ICD-10-CM | POA: Diagnosis not present

## 2015-11-11 MED ORDER — LANSOPRAZOLE 30 MG PO CPDR: 30.0000 mg | DELAYED_RELEASE_CAPSULE | Freq: Two times a day (BID) | ORAL | 3 refills | Status: DC

## 2015-11-11 MED ORDER — HYDROCODONE-ACETAMINOPHEN 5-325 MG PO TABS: 1.0000 | ORAL_TABLET | Freq: Four times a day (QID) | ORAL | 0 refills | Status: DC | PRN

## 2015-11-11 MED ORDER — CLONAZEPAM 1 MG PO TABS: ORAL_TABLET | ORAL | 0 refills | Status: DC

## 2015-11-11 NOTE — Progress Notes (Signed)
Subjective:    Patient ID: Elijah Marshall, male    DOB: Jun 29, 1950, 65 y.o.   MRN: [id number] Chief Complaint  Patient presents with  . Medication Refill    Hydrocodone-Ace 5-'325mg'$ , (pt would like to discuss Dexilant 60 mg), depression scale, score 2    HPI  Elijah Marshall is a delightful 65 yo male here for a 3 mo follow-up on his chronic medical conditions. He is now 6 months out from the VATS/wedge resection of the RLL superior segmentectomy for stage 1 non-small cell lung cancer.  HTN: On lopressor 12.5 bid. Checking outside office - has been 130-135 at home, being great at pulmonary rehab as well.  HPL: On pravastatin 40 which we started 7 mos prior as total chol was 260 with LDL of 171. He did have nml LFTs after 2 mos and after 4 months his cholesterol was sig improved to LDL 124, hdl 64, non-hdl 151  COPD and S/p lower lobectomy of right stage 1 non-small cell lung ca in 05/2015, quit smoking around 2014. In pulm rehab and following closely with pulmonary Dr. Melvyn Novas, oncology Dr. Julien Nordmann, and CT surgeon Dr. Roxan Hockey. Had o/n pulse ox study though [facility]. On symbicort 2 puffs bid. He has an appt with pulm Dr. Melvyn Novas in 1 wk and with Dr. Roxan Hockey in 2 wks. Has oncology f/u in 6 wk with Dr. Julien Nordmann with a CT scan the week prior.  Finished pulm rehab and hoping to   Allergic rhinitis and chronic recurrent sinusitis: Seen by Dr. Constance Holster at [facility] who thought that the nasal irritation was caused by to much dry-ness from the nasal steroid and antihistamine which weren't seeming to provide any relef so rec stop both and just use nasal saline spray. nasacort caused HAs.  On xyzal at night  with frequent nasal saline spray and started home cool mist humidifier - has not gotten it ??  It hasn't been as bad as it was.  Did try nasal saline throughout the day.     GERD: On dexilant and watches diet. Doing great but the dexilant is ridiculously expensive - would like to switch to prevacid  as recommend by his pharmacist. Using the zantac as needed.   Arthritis:  Depression/Anxiety: worsened during cancer diagnosis. Responded well to prn klonopin.  Is having pain from left hip radiating down to right ankle.  Is having some pain in your left back and has been told he would eventually need a hip replacement. Sometimes burns sometimes just hurts in his thigh muscle.  Worse when he internally rotates his hip.  Not sure if it coming from his back. He has been taking otc meds for his hip.  Uses the klonopin for sleep about 2x/wk.  Past Medical History:  Diagnosis Date  . Allergy   . Arthritis   . Cancer [facility])    unsure at this time  . Cancer of lower lobe of right lung ([place])   . COPD (chronic obstructive pulmonary disease) ([place])    per patient's health survey - he put a ?  . Depression   . GERD (gastroesophageal reflux disease)   . Hypertension   . Non-small cell carcinoma of lung, right ([place]) 06/24/2015  . Shortness of breath   . Ulcer [facility])    Past Surgical History:  Procedure Laterality Date  . ANTERIOR FUSION LUMBAR SPINE  07/25/2012  . ANTERIOR LAT LUMBAR FUSION Left 07/25/2012   Procedure: ANTERIOR LATERAL LUMBAR FUSION 1 LEVEL;  Surgeon: [name], MD;  Location: [place];  Service: Orthopedics;  Laterality: Left;  Left sided lumbar 3-4 lateral interbody fusion with allograft and instrumentation  . COLONOSCOPY  14   polyps rem  . JOINT REPLACEMENT Right 2010  . NODE DISSECTION Right 05/24/2015   Procedure: NODE DISSECTION;  Surgeon: [name], MD;  Location: [place];  Service: Thoracic;  Laterality: Right;  . SEGMENTECOMY Right 05/24/2015   Procedure: RIGHT LOWER LOBE SUPERIOR SEGMENTECTOMY;  Surgeon: [name], MD;  Location: [place];  Service: Thoracic;  Laterality: Right;  . [place]  2012  . VIDEO ASSISTED THORACOSCOPY (VATS)/WEDGE RESECTION Right 05/24/2015   Procedure: RIGHT VIDEO ASSISTED THORACOSCOPY (VATS)/ RIGHTR LOWER LOBE  WEDGE RESECTION;  Surgeon: [name], MD;  Location: [place];  Service: Thoracic;  Laterality: Right;   Current Outpatient Prescriptions on File Prior to Visit  Medication Sig Dispense Refill  . Albuterol Sulfate (PROAIR RESPICLICK) 527 (90 BASE) MCG/ACT AEPB Inhale 2 puffs into the lungs every 4 (four) hours as needed. 1 each 11  . budesonide-formoterol (SYMBICORT) 80-4.5 MCG/ACT inhaler Inhale 2 puffs into the lungs 2 (two) times daily. 1 Inhaler 5  . Guaifenesin (MUCINEX MAXIMUM STRENGTH) 1200 MG TB12 Take 1 tablet (1,200 mg total) by mouth every 12 (twelve) hours as needed. 14 tablet 1  . levocetirizine (XYZAL) 5 MG tablet Take 1 tablet (5 mg total) by mouth every evening. 30 tablet 11  . metoprolol tartrate (LOPRESSOR) 25 MG tablet TAKE 1/2 A TABLET BY MOUTH TWICE A DAY 30 tablet 1  . polyethylene glycol (MIRALAX) packet Take 17 g by mouth daily.    . pravastatin (PRAVACHOL) 40 MG tablet TAKE 1 TABLET BY MOUTH AT BEDTIME 90 tablet 0  . ranitidine (ZANTAC) 150 MG capsule Take 150 mg by mouth every evening. Take 2 tablets HS    . senna (SENOKOT) 8.6 MG TABS tablet Take 2 tablets (17.2 mg total) by mouth at bedtime. 120 each 0  . SYMBICORT 80-4.5 MCG/ACT inhaler INHALE 2 PUFFS BY MOUTH TWICE A DAY 10.2 Inhaler 5   No current facility-administered medications on file prior to visit.    Allergies  Allergen Reactions  . Aspirin Nausea And Vomiting  . Morphine And Related Itching   Family History  Problem Relation Age of Onset  . Cancer Mother     colon  . Heart disease Father   . Diabetes Sister   . Hypertension Sister   . Cancer Sister   . Hypertension Brother   . Heart disease Maternal Grandmother     heart attack   Social History   Social History  . Marital status: Married    Spouse name: N/A  . Number of children: N/A  . Years of education: N/A   Occupational History  . truck driver-disability [place] History Main Topics  . Smoking status:  Former Smoker    Packs/day: 1.00    Years: 35.00    Types: Cigarettes    Quit date: 09/24/2011  . Smokeless tobacco: Never Used  . Alcohol use No     Comment: weekly  . Drug use: No  . Sexual activity: Not Asked   Other Topics Concern  . None   Social History Narrative  . None     Review of Systems  Constitutional: Positive for fatigue. Negative for activity change, appetite change, chills, diaphoresis, fever and unexpected weight change.  Respiratory: Positive for shortness of breath. Negative for cough  and chest tightness.   Cardiovascular: Negative for chest pain, palpitations and leg swelling.  Musculoskeletal: Positive for arthralgias. Negative for gait problem and joint swelling.  Psychiatric/Behavioral: Positive for sleep disturbance.   See HPI    Objective:   Physical Exam  Constitutional: He is oriented to person, place, and time. He appears well-developed and well-nourished. No distress.  HENT:  Head: Normocephalic and atraumatic.  Eyes: No scleral icterus.  Pulmonary/Chest: Effort normal.  Musculoskeletal:       Left hip: He exhibits decreased range of motion. He exhibits no tenderness and no bony tenderness.       Lumbar back: He exhibits decreased range of motion, pain and spasm. He exhibits no tenderness and no bony tenderness.  Most tender to palpation over left SI joint.  Neurological: He is alert and oriented to person, place, and time. Gait normal.  Reflex Scores:      Patellar reflexes are 2+ on the right side and 2+ on the left side. Skin: Skin is warm and dry. He is not diaphoretic.  Psychiatric: He has a normal mood and affect. His behavior is normal.   BP (!) 154/102 (BP Location: Left Arm, Cuff Size: Normal)   Pulse 92   Temp 98.6 F (37 C) (Oral)   Resp 16   Ht '5\' 7"'$  (1.702 m)   Wt 180 lb (81.6 kg)   SpO2 93%   BMI 28.19 kg/m   Assessment & Plan:   Insurance doesn't cover proair respiclick so would like to try a different type of  inhaler.  1. Essential hypertension   2. Hyperlipidemia, unspecified hyperlipidemia type   3. COPD GOLD II  - has appt with Dr. Melvyn Novas next wk  4. Non-small cell carcinoma of lung, right ([place])   5. Chronic rhinitis, unspecified type - pt saw Dr. Constance Holster, ENT sev mos prior who noted that pt had not had any improvement in sxs while on nasal steroid and antihistamine. He suspected that pt's sxs were coming from the nasal mucosa being to dry which these meds exacerbate.  However, pt did notice his sxs worsen when stopped and so restarted xyzal with a little improvement but still bothering him - sniffs a lot.  He has not been using the nasal saline as freq as recommended so increase and try cool mist humidifier at night. If sxs do not improve, rec repeat f/u with Dr. Constance Holster and/or cons sinus CT.  6. Chronic sinusitis, unspecified location   7. Gastroesophageal reflux disease, esophagitis presence not specified - insurance does not cover dexilant so try prevacid instead, cont prn zantac.  8. Arthritis   9. Reactive depression - mainly triggered by his anxiety about recent cancer treatment  10. Arthritis of left hip  - significantly worsening. pt has been told he needs a hip replacement a while ago - he would like to see Dr. Maureen Ralphs about this as several people have recommended him. Has had both knees replaced prior.  If sxs worsen prior to ortho appt, call for prednisone taper.    Orders Placed This Encounter  Procedures  . Ambulatory referral to Orthopedic Surgery    Referral Priority:   Routine    Referral Type:   Surgical    Referral Reason:   Second Opinion    Referred to Provider:   Gaynelle Arabian, MD    Requested Specialty:   Orthopedic Surgery    Number of Visits Requested:   1    Meds ordered this encounter  Medications  .  lansoprazole (PREVACID) 30 MG capsule    Sig: Take 1 capsule (30 mg total) by mouth 2 (two) times daily before a meal.    Dispense:  180 capsule    Refill:  3  .  clonazePAM (KLONOPIN) 1 MG tablet    Sig: Take 1/2 tablet as needed twice a day for anxiety and 1-2 tablets at night as needed for sleep    Dispense:  60 tablet    Refill:  0  . HYDROcodone-acetaminophen (NORCO/VICODIN) 5-325 MG tablet    Sig: Take 1-2 tablets by mouth every 6 (six) hours as needed for moderate pain.    Dispense:  120 tablet    Refill:  0     [name], M.D.  [facility] [address] [place], [place] [address] [phone] phone [phone] fax  11/15/15 10:44 PM

## 2015-11-11 NOTE — Telephone Encounter (Signed)
Left message with Thayer Headings to have Cloyde Reams return call.

## 2015-11-11 NOTE — Patient Instructions (Addendum)
  If your hip is hurting worse and you want to do a burst of prednisone to calm it down after your appointment with Dr. Melvyn Novas next week, just call and I will send it in.  Lets try the prevacid rather than the dexilant. Continue using the zantac as needed.  Start a cool mist humidifier over night - I really think this right next to your bed might help significantly. Continue the xyzal. Continue using nasal saline throughout the day.  If this isn't working, lets get you back to Dr. Constance Holster.     IF you received an x-ray today, you will receive an invoice from [facility]. Please contact [facility] at [phone] with questions or concerns regarding your invoice.   IF you received labwork today, you will receive an invoice from [employer]. Please contact [facility] at [phone] with questions or concerns regarding your invoice.   Our billing staff will not be able to assist you with questions regarding bills from these companies.  You will be contacted with the lab results as soon as they are available. The fastest way to get your results is to activate your My Chart account. Instructions are located on the last page of this paperwork. If you have not heard from [place] regarding the results in 2 weeks, please contact this office.

## 2015-11-12 ENCOUNTER — Telehealth: Payer: Self-pay | Admitting: *Deleted

## 2015-11-12 NOTE — Telephone Encounter (Signed)
Spoke with Cloyde Reams, aware that MW has okayed the order for O2.  Order placed to [facility] for POC eval up to 4 Liters O2.  Nothing further needed.

## 2015-11-12 NOTE — Telephone Encounter (Signed)
Left message w/ Parke Simmers to have [facility] call the office back, she was with a patient.

## 2015-11-12 NOTE — Telephone Encounter (Signed)
Faxed signed medical release form, confirmation page received at 10:16 am.

## 2015-11-12 NOTE — Telephone Encounter (Signed)
Fine with me to order it like that

## 2015-11-12 NOTE — Telephone Encounter (Signed)
Elijah Marshall w/ Pulm Rehab returned call He's been in the program since 6.29 - they started him on O2 because he was desatting On RA he got down to 82-86% at his graduation 6MW.  Because of this, he will need O2 to use at home.  They were doing high-intensity interval training with him because he was able to and if he is to continue this, he will need home O2 to use w/ exertion.  During the walk test, he needed to 4lpm to stay above 90%.  She has spoken with patient about this, and while he does not want the O2 he is aware that it is necessary.  MW please advise, thank you. No need to call Elijah Marshall back.  Routing message as High Priority since it's regarding new O2 start

## 2015-11-15 ENCOUNTER — Telehealth: Payer: Self-pay | Admitting: *Deleted

## 2015-11-15 NOTE — Telephone Encounter (Signed)
Called patient this morning to come to [place] clinic to sign correct medical release form. Patient will come today to sign form.

## 2015-11-15 NOTE — Telephone Encounter (Signed)
Faxed signed medical records form to [facility], per Dr Brigitte Pulse. Confirmation page received.

## 2015-11-16 ENCOUNTER — Encounter: Payer: Managed Care, Other (non HMO) | Admitting: Thoracic Surgery (Cardiothoracic Vascular Surgery)

## 2015-11-16 ENCOUNTER — Encounter (HOSPITAL_COMMUNITY): Payer: Commercial Managed Care - HMO

## 2015-11-18 ENCOUNTER — Encounter (HOSPITAL_COMMUNITY): Payer: Commercial Managed Care - HMO

## 2015-11-18 ENCOUNTER — Other Ambulatory Visit: Payer: Self-pay | Admitting: Thoracic Surgery (Cardiothoracic Vascular Surgery)

## 2015-11-19 ENCOUNTER — Encounter: Payer: Self-pay | Admitting: Internal Medicine

## 2015-11-19 ENCOUNTER — Telehealth: Payer: Self-pay

## 2015-11-19 ENCOUNTER — Ambulatory Visit (INDEPENDENT_AMBULATORY_CARE_PROVIDER_SITE_OTHER): Payer: Commercial Managed Care - HMO | Admitting: Internal Medicine

## 2015-11-19 VITALS — BP 156/90 | HR 82 | Ht 67.0 in | Wt 182.0 lb

## 2015-11-19 DIAGNOSIS — J9611 Chronic respiratory failure with hypoxia: Secondary | ICD-10-CM | POA: Insufficient documentation

## 2015-11-19 DIAGNOSIS — J449 Chronic obstructive pulmonary disease, unspecified: Secondary | ICD-10-CM | POA: Diagnosis not present

## 2015-11-19 MED ORDER — PREDNISONE 10 MG PO TABS: ORAL_TABLET | ORAL | 0 refills | Status: DC

## 2015-11-19 MED ORDER — BUDESONIDE-FORMOTEROL FUMARATE 160-4.5 MCG/ACT IN AERO: INHALATION_SPRAY | RESPIRATORY_TRACT | 11 refills | Status: DC

## 2015-11-19 MED ORDER — ALBUTEROL SULFATE HFA 108 (90 BASE) MCG/ACT IN AERS: INHALATION_SPRAY | RESPIRATORY_TRACT | 1 refills | Status: DC

## 2015-11-19 NOTE — Patient Instructions (Addendum)
Prednisone 10 mg take  4 each am x 2 days,   2 each am x 2 days,  1 each am x 2 days and stop   Increase symbicort back up to 160 Take 2 puffs first thing in am and then another 2 puffs about 12 hours later.   Only use your albuterol(proair hfa) as a rescue medication to be used if you can't catch your breath by resting or doing a relaxed purse lip breathing pattern.  - The less you use it, the better it will work when you need it. - Ok to use up to 2 puffs  every 4 hours if you must but call for immediate appointment if use goes up over your usual need - Don't leave home without it !!  (think of it like the spare tire for your car)   Please schedule a follow up office visit in 6 weeks, call sooner if needed with pfts on return

## 2015-11-19 NOTE — Progress Notes (Signed)
Subjective:    Patient ID: Elijah Marshall, male    DOB: 01-01-1951  MRN: [id number]   Brief patient profile:  65  yobm quit smoking 2013  and able work out at [employer] and did fine until hurt back 2011 then 2 back surgeries and knee surgery  and more noticeable doe  since then but also limited by back and referred to pulmonary clinic 09/05/2013 for ? Copd  With GOLD II criteria 10/29/13     History of Present Illness  09/05/2013 1st [facility] office visit/ Elijah Marshall  Chief Complaint  Patient presents with  . Pulmonary Consult    Referred per Dr. Norberto Sorenson.  Pt c/o SOB for the past 3 yrs. He states that he was dxed with COPD back in 2012 or 2013.  He states that that he sometimes has trouble with breathing when walking up stairs and lifting things.    rx spiriva first, alb, dulera not as effective  No problem at rest or supine Assoc nasal congestion much better p shot (?depomedrol) and bad again x sev years corresponds to worse doe  Ex = walking one mile slower pace than wife,trouble with hills, worse in heat  Bad hb even on dexilant  Min dry cough  rec Stop spiriva  Start anoro two puffs off one click each am Zantac 150 mg one at  Bedtime Prednisone 10 mg take  4 each am x 2 days,   2 each am x 2 days,  1 each am x 2 days and stop  GERD diet        10/29/2013 f/u ov/Elijah Marshall re:  GOLD II, no need for saba on anoro daily  Chief Complaint  Patient presents with  . Follow-up    Breathing is some better- has not use rescue inhaler since the last visit. He states that he is coughing up minimal yellow sputum and still c/o sinus pressure.   cough worse x sev weeks with nasal congestion not using singulair consistently at all with neg sinus ct noted.  advil cold and sinus helps some. Not limited by breathing from desired activities   rec  singulair trial daily > x one month no better so d/c'd     08/09/2015 NP Acute OV :COPD GOLD II /NSCLC Stage 1A s/p wedge resection 05/2015  Pt  presents for an acute office visit. Complains of 2 weeks of prod cough with white mucus, sinus pressure/congestion x 1 week. Denies any SOB/wheezing, chest congestion/tightness, fever, nausesa or vomiting. Has sinus congestion and pressure, teeth pain on/off.   dx of NSCLC s/p wedge resection in April 2017.  Has planned follow up serial CT chest with Dr. Arbutus Ped coming up soon. rec Augmentin 875mg  Twice daily  For 10 days -take with food.  Mucinex DM Twice daily  As needed  Cough/congestion  Saline nasal rinses As needed        11/19/2015  f/u ov/Elijah Marshall re:  GOLD II / maint rx symb80/ Chief Complaint  Patient presents with  . Follow-up    pt c/o stuffy nose, PND, prod cough with white/yellow mucus X1-2 weeks.     says needed 02 at highest level of exercise for rehab and wants to have it to continue 02 on maint rx   Worse nasal congestion x sev weeks assoc with worse cough but no wheeze  No obvious day to day or daytime variability or assoc mucus plugs or hemoptysis or cp or chest tightness, subjective wheeze or overt  hb symptoms. No unusual exp hx or h/o childhood pna/ asthma or knowledge of premature birth.  Sleeping ok without nocturnal  or early am exacerbation  of respiratory  c/o's or need for noct saba. Also denies any obvious fluctuation of symptoms with weather or environmental changes or other aggravating or alleviating factors except as outlined above   Current Medications, Allergies, Complete Past Medical History, Past Surgical History, Family History, and Social History were reviewed in [employer] record.  ROS  The following are not active complaints unless bolded sore throat, dysphagia, dental problems, itching, sneezing,  nasal congestion or excess/ purulent secretions, ear ache,   fever, chills, sweats, unintended wt loss, classically pleuritic or exertional cp,  orthopnea pnd or leg swelling, presyncope, palpitations, abdominal pain, anorexia, nausea,  vomiting, diarrhea  or change in bowel or bladder habits, change in stools or urine, dysuria,hematuria,  rash, arthralgias, visual complaints, headache, numbness, weakness or ataxia or problems with walking or coordination,  change in mood/affect or memory.              Objective:   Physical Exam  amb bm nad   Wt Readings from Last 3 Encounters:  11/19/15 182 lb (82.6 kg)  11/11/15 180 lb (81.6 kg)  11/04/15 181 lb 3.5 oz (82.2 kg)    Vital signs reviewed  - Note on arrival 02 sats  99% on RA    HEENT mild turbinate edema nonspecific bilaterally. Oropharynx no thrush or excess pnd or cobblestoning.  No JVD or cervical adenopathy. Mild accessory muscle hypertrophy. Trachea midline, nl thryroid. Chest was hyperinflated by percussion with diminished breath sounds and mild exp time with minimal end exp. Hoover sign positive at end inspiration. Regular rate and rhythm without murmur gallop or rub or increase P2 or edema.  Abd: no hsm, nl excursion. Ext warm without cyanosis or clubbing.          I personally reviewed images and agree with radiology impression as follows:  CXR:  /08/09/15 Expected postoperative changes involving the right lower lobe. No findings for recurrent tumor or acute pulmonary abnormality.

## 2015-11-19 NOTE — Telephone Encounter (Signed)
Received 139 pages from [place]

## 2015-11-21 NOTE — Assessment & Plan Note (Addendum)
-   Spirometry  03/01/12  FEV1  1.63 - Spirometry   09/05/13 FEV1  1.34 (46%) ratio 44  - PFTs   10/29/2013    FEV1    1.70 (62%) ratio 50% no better after ssaba and DLCO 48%  - 09/05/2013  Walked RA x 3 laps @ fast pace @  185 ft each stopped due to end of study, fast pace,sat 89% at end  - 01/06/14 gradual worsening on anoro plus increase need for saba  - 01/28/2014   > try spiriva respimat/ symbicort 160 2bid > changed to symbicort 160 2bid only on 01/28/14  - 02/25/2014   90%   > changed to symbicort 80 2bid since hoarse on 160 and clear on exam    - 11/19/2015 rechallenge with symbicort 160 Take 2 puffs first thing in am and then another 2 puffs about 12 hours later.  - mild flare presently related to rhinitis > Prednisone 10 mg take  4 each am x 2 days,   2 each am x 2 days,  1 each am x 2 days and stop   He has more of a tendency to AB than eCOPD so symb appropriate here if can tol the 160 dose s flare of upper airway symptoms. If not, rec add lama next ov p baseline pfts s/p wedge resection pending in meantime  I had an extended discussion with the patient reviewing all relevant studies completed to date and  lasting 15 to 20 minutes of a 25 minute visit    Each maintenance medication was reviewed in detail including most importantly the difference between maintenance and prns and under what circumstances the prns are to be triggered using an action plan format that is not reflected in the computer generated alphabetically organized AVS.    Please see instructions for details which were reviewed in writing and the patient given a copy highlighting the part that I personally wrote and discussed at today's ov.

## 2015-11-21 NOTE — Assessment & Plan Note (Signed)
SATURATION QUALIFICATIONS:  11/19/2015  Patient Saturations on Room Air at Rest = 96% Patient Saturations on Room Air while Ambulating = 86% Patient Saturations on 2  Liters of oxygen while Ambulating = 96%  rec as of 11/19/2015 Only needs 02 2lpm with exertion, not at rest / sleeping or just adls around the house  Or even walking < 200 ft

## 2015-11-23 ENCOUNTER — Ambulatory Visit
Admission: RE | Admit: 2015-11-23 | Discharge: 2015-11-23 | Disposition: A | Discharge status: Home / Self Care | Payer: Commercial Managed Care - HMO | Source: Ambulatory Visit | Attending: Thoracic Surgery (Cardiothoracic Vascular Surgery) | Admitting: Thoracic Surgery (Cardiothoracic Vascular Surgery)

## 2015-11-23 ENCOUNTER — Encounter (HOSPITAL_COMMUNITY): Payer: Commercial Managed Care - HMO

## 2015-11-23 ENCOUNTER — Ambulatory Visit (INDEPENDENT_AMBULATORY_CARE_PROVIDER_SITE_OTHER): Payer: Commercial Managed Care - HMO | Admitting: Thoracic Surgery (Cardiothoracic Vascular Surgery)

## 2015-11-23 ENCOUNTER — Encounter: Payer: Self-pay | Admitting: Thoracic Surgery (Cardiothoracic Vascular Surgery)

## 2015-11-23 VITALS — BP 156/95 | HR 85 | Resp 16 | Ht 67.0 in | Wt 182.0 lb

## 2015-11-23 DIAGNOSIS — C3490 Malignant neoplasm of unspecified part of unspecified bronchus or lung: Secondary | ICD-10-CM

## 2015-11-23 DIAGNOSIS — C3431 Malignant neoplasm of lower lobe, right bronchus or lung: Secondary | ICD-10-CM

## 2015-11-23 DIAGNOSIS — Z09 Encounter for follow-up examination after completed treatment for conditions other than malignant neoplasm: Secondary | ICD-10-CM | POA: Diagnosis not present

## 2015-11-23 NOTE — Progress Notes (Signed)
[address]       [place] [address]             [phone]       HPI: Mr. Oshields returns today for a scheduled 6 month follow-up visit.  He is a 65 year old former smoker who had a right lower lobe superior segmentectomy for stage IA non-small cell carcinoma in April 2017. He had difficulty weaning from oxygen initially and went home on 2 L nasal cannula. He recently saw Dr. Melvyn Novas and is going to be on oxygen during heavy exertion. He does not needed routinely.  He had a T1a, N0 stage IA non-small cell carcinoma. He saw Dr. Julien Nordmann. He did not require adjuvant therapy.  He recently has been on prednisone for nasal congestion and cough.  He has minimal incisional pain. He is not taking anything for that.  Past Medical History:  Diagnosis Date  . Allergy   . Arthritis   . Cancer [facility])    unsure at this time  . Cancer of lower lobe of right lung ([place])   . COPD (chronic obstructive pulmonary disease) ([place])    per patient's health survey - he put a ?  . Depression   . GERD (gastroesophageal reflux disease)   . Hypertension   . Non-small cell carcinoma of lung, right ([place]) 06/24/2015  . Shortness of breath   . Ulcer [facility])     Current Outpatient Prescriptions  Medication Sig Dispense Refill  . albuterol (PROAIR HFA) 108 (90 Base) MCG/ACT inhaler 2 puffs every 4 hours as needed only  if your can't catch your breath 1 Inhaler 1  . Albuterol Sulfate (PROAIR RESPICLICK) 767 (90 BASE) MCG/ACT AEPB Inhale 2 puffs into the lungs every 4 (four) hours as needed. 1 each 11  . budesonide-formoterol (SYMBICORT) 160-4.5 MCG/ACT inhaler Take 2 puffs first thing in am and then another 2 puffs about 12 hours later. 1 Inhaler 11  . clonazePAM (KLONOPIN) 1 MG tablet Take 1/2 tablet as needed twice a day for anxiety and 1-2 tablets at night as needed for sleep 60 tablet 0  . Guaifenesin (MUCINEX MAXIMUM STRENGTH) 1200 MG TB12 Take 1 tablet (1,200 mg total) by mouth every 12  (twelve) hours as needed. 14 tablet 1  . HYDROcodone-acetaminophen (NORCO/VICODIN) 5-325 MG tablet Take 1-2 tablets by mouth every 6 (six) hours as needed for moderate pain. 120 tablet 0  . lansoprazole (PREVACID) 30 MG capsule Take 1 capsule (30 mg total) by mouth 2 (two) times daily before a meal. 180 capsule 3  . levocetirizine (XYZAL) 5 MG tablet Take 1 tablet (5 mg total) by mouth every evening. 30 tablet 11  . metoprolol tartrate (LOPRESSOR) 25 MG tablet TAKE 1/2 A TABLET BY MOUTH TWICE A DAY 30 tablet 1  . polyethylene glycol (MIRALAX) packet Take 17 g by mouth daily.    . pravastatin (PRAVACHOL) 40 MG tablet TAKE 1 TABLET BY MOUTH AT BEDTIME 90 tablet 0  . predniSONE (DELTASONE) 10 MG tablet Take  4 each am x 2 days,   2 each am x 2 days,  1 each am x 2 days and stop 14 tablet 0  . ranitidine (ZANTAC) 150 MG capsule Take 150 mg by mouth every evening. Take 2 tablets HS    . senna (SENOKOT) 8.6 MG TABS tablet Take 2 tablets (17.2 mg total) by mouth at bedtime. 120 each 0   No current facility-administered medications for this visit.  Physical Exam BP (!) 156/95   Pulse 85   Resp 16   Ht '5\' 7"'$  (1.702 m)   Wt 182 lb (82.6 kg)   SpO2 95% Comment: ON RA  BMI 28.60 kg/m  65 year old male in no acute distress Alert and oriented 3 with no focal deficits No cervical or supraclavicular adenopathy Lungs clear with equal breath sounds bilaterally Cardiac regular rate and rhythm normal S1 and S2 Incisions well healed  Diagnostic Tests: CHEST  2 VIEW  COMPARISON:  08/09/2015  FINDINGS: Surgical staple line right lower lobe. Blunting of the right costophrenic angle unchanged compatible with scarring. Mild postop scarring right lung base.  Left lung remains clear. Negative for heart failure. No mass or adenopathy.  IMPRESSION: Postop changes on the right with interval improvement. No superimposed acute abnormality.   Electronically Signed   By: Franchot Gallo M.D.    On: 11/23/2015 12:22 I personally reviewed the chest x-ray and concur with the findings noted above.  Impression: 65 year old man who is now 6 months out from a right lower lobe superior segmentectomy for stage IA non-small cell carcinoma. He is doing well from a surgical standpoint with no evidence of recurrent disease. His chest x-ray today looks great.  COPD- is followed by Dr. Christinia Gully for that. It is stable symptomatically. He is going to use oxygen for heavy exercise.  He has an appointment with Dr. Julien Nordmann will have a CT of the chest next month.  Plan: Return in 6 months for one year follow-up visit.  [name], MD [facility] [phone]

## 2015-11-25 ENCOUNTER — Other Ambulatory Visit: Payer: Self-pay | Admitting: Thoracic Surgery (Cardiothoracic Vascular Surgery)

## 2015-12-07 NOTE — Addendum Note (Signed)
Encounter addended by: Jewel Baize, RD on: 12/07/2015 10:22 AM<BR>    Actions taken: Flowsheet data copied forward, Visit Navigator Flowsheet section accepted

## 2015-12-09 NOTE — Progress Notes (Signed)
Discharge Summary  Patient Details  Name: Elijah Marshall MRN: [id number] Date of Birth: October 03, 1950 Referring Provider:   April Manson [facility] Walk Test from 08/05/2015 in [place]  Referring Provider  Dr. Roxan Hockey       Number of Visits: 23  Reason for Discharge:  Patient independent in their exercise.  Smoking History:  History  Smoking Status  . Former Smoker  . Packs/day: 1.00  . Years: 35.00  . Types: Cigarettes  . Quit date: 09/24/2011  Smokeless Tobacco  . Never Used    Diagnosis:  Malignant neoplasm of lower lobe of right lung ([place])  ADL [facility]:   Initial Exercise Prescription:     Initial Exercise Prescription - 08/05/15 1600      Date of Initial Exercise RX and Referring Provider   Date 08/05/15   Referring Provider Dr. Roxan Hockey     Treadmill   MPH 1.7   Grade 0   Minutes 17     NuStep   Level 2   Minutes 17   METs 1.5     Elliptical   Level 2   Speed 1   Minutes 17     Prescription Details   Frequency (times per week) 2   Duration Progress to 45 minutes of aerobic exercise without signs/symptoms of physical distress     Intensity   THRR 40-80% of Max Heartrate 62-125   Ratings of Perceived Exertion 11-13   Perceived Dyspnea 0-4     Progression   Progression Continue progressive overload as per policy without signs/symptoms or physical distress.     Resistance Training   Training Prescription Yes   Weight blue bands   Reps 10-12      Discharge Exercise Prescription (Final Exercise Prescription Changes):     Exercise Prescription Changes - 11/04/15 1500      Response to Exercise   Blood Pressure (Admit) 132/60   Blood Pressure (Exercise) 150/80   Blood Pressure (Exit) 138/70   Heart Rate (Admit) 78 bpm   Heart Rate (Exercise) 106 bpm   Heart Rate (Exit) 95 bpm   Oxygen Saturation (Admit) 95 %   Oxygen Saturation (Exercise) 84 %   Oxygen Saturation (Exit) 95 %   Rating of  Perceived Exertion (Exercise) 13   Perceived Dyspnea (Exercise) 2   Duration Progress to 45 minutes of aerobic exercise without signs/symptoms of physical distress   Intensity THRR unchanged     Progression   Progression Continue to progress workloads to maintain intensity without signs/symptoms of physical distress.     Resistance Training   Training Prescription Yes   Weight blue bands   Reps 10-12  10 minutes of strength training     Interval Training   Interval Training Yes     Oxygen   Oxygen Continuous   Liters 2     Treadmill   MPH 2.4   Grade 3   Minutes 17     NuStep   Level 5   Minutes 17   METs 3.6      Functional Capacity:     6 Minute Walk    Row Name 08/05/15 1646 11/09/15 1643       6 Minute Walk   Phase Initial Discharge    Distance 1410 feet 1480 feet    Walk Time 6 minutes 6 minutes    # of Rest Breaks 0 0    MPH 2.67 2.8    METS 2.99 3.14  RPE 13 13    Perceived Dyspnea  1 2    Symptoms No No    Resting HR 121 bpm 90 bpm    Resting BP 150/88 108/60    Max Ex. HR 121 bpm 124 bpm    Max Ex. BP 178/84 160/84    2 Minute Post BP 148/84 140/84      Interval HR   Baseline HR 89 90    1 Minute HR 97 101    2 Minute HR 94 101    3 Minute HR 89 100    4 Minute HR 88 111    5 Minute HR 87 124    6 Minute HR 87 115    2 Minute Post HR 96 102    Interval Heart Rate? Yes Yes      Interval Oxygen   Interval Oxygen?  - Yes    Baseline Oxygen Saturation % 96 % 97 %    Baseline Liters of Oxygen 0 L 0 L    1 Minute Oxygen Saturation % 97 % 97 %    1 Minute Liters of Oxygen 0 L 0 L    2 Minute Oxygen Saturation % 94 % 93 %    2 Minute Liters of Oxygen 0 L 0 L    3 Minute Oxygen Saturation % 89 % 88 %    3 Minute Liters of Oxygen 0 L 0 L    4 Minute Oxygen Saturation % 88 % 86 %    4 Minute Liters of Oxygen 0 L 0 L    5 Minute Oxygen Saturation % 87 % 82 %    5 Minute Liters of Oxygen 0 L 2 L    6 Minute Oxygen Saturation % 87 % 84 %     6 Minute Liters of Oxygen 0 L 4 L    2 Minute Post Oxygen Saturation % 96 % 95 %    2 Minute Post Liters of Oxygen 0 L 4 L       Psychological, QOL, Others - Outcomes: PHQ 2/9: Depression screen [facility] 2/9 11/11/2015 11/09/2015 08/02/2015 06/24/2015 04/15/2015  Decreased Interest 0 1 0 0 0  Down, Depressed, Hopeless 1 1 0 1 2  PHQ - 2 Score 1 2 0 1 2  Altered sleeping 1 1 - - 2  Tired, decreased energy 0 0 - - 3  Change in appetite 0 3 - - 0  Feeling bad or failure about yourself  0 0 - - 3  Trouble concentrating 0 0 - - 0  Moving slowly or fidgety/restless 0 0 - - 1  Suicidal thoughts 0 0 - - 1  PHQ-9 Score 2 6 - - 12  Difficult doing work/chores Not difficult at all Somewhat difficult - - -    Quality of Life:     Quality of Life - 11/04/15 1520      Quality of Life Scores   Health/Function Post 19.6 %   Socioeconomic Post 17.31 %   Psych/Spiritual Post 24 %   Family Post 21 %   GLOBAL Post 20.13 %      Personal Goals: Goals established at orientation with interventions provided to work toward goal.     Personal Goals and Risk Factors at Admission - 08/02/15 1033      Core Components/Risk Factors/Patient Goals on Admission   Increase Strength and Stamina Yes   Intervention Provide advice, education, support and counseling about physical activity/exercise needs.;Develop an individualized  exercise prescription for aerobic and resistive training based on initial evaluation findings, risk stratification, comorbidities and participant's personal goals.   Expected Outcomes Achievement of increased cardiorespiratory fitness and enhanced flexibility, muscular endurance and strength shown through measurements of functional capacity and personal statement of participant.   Improve shortness of breath with ADL's Yes   Intervention Provide education, individualized exercise plan and daily activity instruction to help decrease symptoms of SOB with activities of daily living.    Expected Outcomes Short Term: Achieves a reduction of symptoms when performing activities of daily living.       Personal Goals Discharge:     Goals and Risk Factor Review    Row Name 08/02/15 1034 08/16/15 1351 09/13/15 1549 10/14/15 0845 11/09/15 1004     Core Components/Risk Factors/Patient Goals Review   Personal Goals Review Increase Strength and Stamina;Improve shortness of breath with ADL's Increase Strength and Stamina;Improve shortness of breath with ADL's  - Improve shortness of breath with ADL's;Increase Strength and Stamina Improve shortness of breath with ADL's;Increase Strength and Stamina   Review Increase strength and stamina by increasing exercise has only exercised 3 sessions with [place], too early to see an improvement attended 8 exercise sessions, increasing worloads and tolerating well Is participating in high intensity interval training and doing well [place] 11/09/2015  has made great progress and requires oxygen while exercising.   Expected Outcomes Improved strength and stamina as before lung surgery Should see an increase in strength and stamina in the next 30 days Improved strength and stamina Continue to increase workloads as tolerated  -      Nutrition & Weight - Outcomes:     Pre Biometrics - 08/02/15 1032      Pre Biometrics   Grip Strength 50 kg       Nutrition:     Nutrition Therapy & Goals - 09/07/15 1523      Nutrition Therapy   Diet Therapeutic Lifestyle Changes     Personal Nutrition Goals   Personal Goal #1 Maintain wt while in Pulmonary Rehab     Intervention Plan   Intervention Prescribe, educate and counsel regarding individualized specific dietary modifications aiming towards targeted core components such as weight, hypertension, lipid management, diabetes, heart failure and other comorbidities.   Expected Outcomes Short Term Goal: Understand basic principles of dietary content, such as calories, fat, sodium, cholesterol and  nutrients.;Long Term Goal: Adherence to prescribed nutrition plan.      Nutrition Discharge:     Nutrition Assessments - 11/09/15 1425      Rate Your Plate Scores   Pre Score 50   Post Score 49      Education Questionnaire Score:     Knowledge Questionnaire Score - 11/04/15 1520      Knowledge Questionnaire Score   Post Score 11/13      Goals reviewed with patient; copy given to patient.

## 2015-12-09 NOTE — Addendum Note (Signed)
Encounter addended by: Lance Morin, RN on: 12/09/2015  9:54 AM<BR>    Actions taken: Sign clinical note, Episode resolved

## 2015-12-21 ENCOUNTER — Telehealth: Payer: Self-pay | Admitting: *Deleted

## 2015-12-21 NOTE — Telephone Encounter (Signed)
I have two different appointment times for tomorrow and need clarification."  Advised to come to [facility] first then go to the [facility] radiology Department for CT scan.  No further questions.

## 2015-12-22 ENCOUNTER — Other Ambulatory Visit (HOSPITAL_BASED_OUTPATIENT_CLINIC_OR_DEPARTMENT_OTHER): Payer: Commercial Managed Care - HMO

## 2015-12-22 ENCOUNTER — Encounter (HOSPITAL_COMMUNITY): Payer: Self-pay

## 2015-12-22 ENCOUNTER — Ambulatory Visit (HOSPITAL_COMMUNITY)
Admission: RE | Admit: 2015-12-22 | Discharge: 2015-12-22 | Disposition: A | Discharge status: Home / Self Care | Payer: Commercial Managed Care - HMO | Source: Ambulatory Visit | Attending: Internal Medicine | Admitting: Internal Medicine

## 2015-12-22 DIAGNOSIS — I2510 Atherosclerotic heart disease of native coronary artery without angina pectoris: Secondary | ICD-10-CM | POA: Insufficient documentation

## 2015-12-22 DIAGNOSIS — C3491 Malignant neoplasm of unspecified part of right bronchus or lung: Secondary | ICD-10-CM | POA: Diagnosis not present

## 2015-12-22 DIAGNOSIS — C3431 Malignant neoplasm of lower lobe, right bronchus or lung: Secondary | ICD-10-CM | POA: Diagnosis not present

## 2015-12-22 DIAGNOSIS — J432 Centrilobular emphysema: Secondary | ICD-10-CM | POA: Diagnosis not present

## 2015-12-22 DIAGNOSIS — I700 Atherosclerosis of aorta: Secondary | ICD-10-CM | POA: Insufficient documentation

## 2015-12-22 DIAGNOSIS — J438 Other emphysema: Secondary | ICD-10-CM | POA: Insufficient documentation

## 2015-12-22 DIAGNOSIS — Z9889 Other specified postprocedural states: Secondary | ICD-10-CM | POA: Insufficient documentation

## 2015-12-22 LAB — COMPREHENSIVE METABOLIC PANEL
ALT: 16 U/L (ref 0–55)
AST: 18 U/L (ref 5–34)
Albumin: 4 g/dL (ref 3.5–5.0)
Alkaline Phosphatase: 155 U/L — ABNORMAL HIGH (ref 40–150)
Anion Gap: 12 mEq/L — ABNORMAL HIGH (ref 3–11)
BUN: 5.8 mg/dL — ABNORMAL LOW (ref 7.0–26.0)
CO2: 26 mEq/L (ref 22–29)
Calcium: 10.8 mg/dL — ABNORMAL HIGH (ref 8.4–10.4)
Chloride: 103 mEq/L (ref 98–109)
Creatinine: 1.2 mg/dL (ref 0.7–1.3)
EGFR: 76 mL/min/{1.73_m2} — ABNORMAL LOW (ref >90)
Glucose: 95 mg/dl (ref 70–140)
Potassium: 4.7 mEq/L (ref 3.5–5.1)
Sodium: 141 mEq/L (ref 136–145)
Total Bilirubin: 0.47 mg/dL (ref 0.20–1.20)
Total Protein: 8.3 g/dL (ref 6.4–8.3)

## 2015-12-22 LAB — CBC WITH DIFFERENTIAL/PLATELET
BASO%: 0.3 % (ref 0.0–2.0)
Basophils Absolute: 0 10*3/uL (ref 0.0–0.1)
EOS%: 1.4 % (ref 0.0–7.0)
Eosinophils Absolute: 0.1 10*3/uL (ref 0.0–0.5)
HCT: 49 % (ref 38.4–49.9)
HGB: 16.6 g/dL (ref 13.0–17.1)
LYMPH%: 29.4 % (ref 14.0–49.0)
MCH: 28.9 pg (ref 27.2–33.4)
MCHC: 33.9 g/dL (ref 32.0–36.0)
MCV: 85.4 fL (ref 79.3–98.0)
MONO#: 0.8 10*3/uL (ref 0.1–0.9)
MONO%: 10.8 % (ref 0.0–14.0)
NEUT#: 4.5 10*3/uL (ref 1.5–6.5)
NEUT%: 58.1 % (ref 39.0–75.0)
Platelets: 278 10*3/uL (ref 140–400)
RBC: 5.74 10*6/uL (ref 4.20–5.82)
RDW: 13.3 % (ref 11.0–14.6)
WBC: 7.8 10*3/uL (ref 4.0–10.3)
lymph#: 2.3 10*3/uL (ref 0.9–3.3)

## 2015-12-22 MED ORDER — IOPAMIDOL (ISOVUE-300) INJECTION 61%
75.0000 mL | Freq: Once | INTRAVENOUS | Status: AC | PRN
  Administered 2015-12-22: 75 mL via INTRAVENOUS

## 2015-12-27 ENCOUNTER — Ambulatory Visit (HOSPITAL_BASED_OUTPATIENT_CLINIC_OR_DEPARTMENT_OTHER): Payer: Commercial Managed Care - HMO | Admitting: Internal Medicine

## 2015-12-27 ENCOUNTER — Encounter: Payer: Self-pay | Admitting: Internal Medicine

## 2015-12-27 ENCOUNTER — Telehealth: Payer: Self-pay | Admitting: Internal Medicine

## 2015-12-27 VITALS — BP 148/95 | HR 100 | Temp 98.2°F | Resp 17 | Ht 67.0 in | Wt 180.2 lb

## 2015-12-27 DIAGNOSIS — I10 Essential (primary) hypertension: Secondary | ICD-10-CM

## 2015-12-27 DIAGNOSIS — Z85118 Personal history of other malignant neoplasm of bronchus and lung: Secondary | ICD-10-CM

## 2015-12-27 DIAGNOSIS — C3491 Malignant neoplasm of unspecified part of right bronchus or lung: Secondary | ICD-10-CM

## 2015-12-27 MED ORDER — AZITHROMYCIN 250 MG PO TABS: ORAL_TABLET | ORAL | 0 refills | Status: DC

## 2015-12-27 NOTE — Progress Notes (Signed)
[place] [phone]   Fax:([phone] [place], [place] [address]  DIAGNOSIS: Stage IA (T1a, N0, M0) non-small cell lung cancer, moderately differentiated squamous cell carcinoma presented with right lower lobe lung nodule  PRIOR THERAPY: Status post wedge resection of the right lower lobe in April 2014 under the care of Dr. Roxan Hockey.  CURRENT THERAPY: Observation.  INTERVAL HISTORY: Elijah Marshall 64 y.o. male returns to the clinic today for follow-up visit accompanied by his wife. The patient is feeling fine today with no specific complaints except for shortness breath with exertion and mild cough. He denied having any fever or chills. He has no nausea or vomiting. He denied having any significant chest pain or hemoptysis. He has no weight loss or night sweats. He had repeat CT scan of the chest performed recently and he is here for evaluation and discussion of his scan results.  MEDICAL HISTORY: Past Medical History:  Diagnosis Date  . Allergy   . Arthritis   . Cancer [facility])    unsure at this time  . Cancer of lower lobe of right lung ([place])   . COPD (chronic obstructive pulmonary disease) ([place])    per patient's health survey - he put a ?  . Depression   . GERD (gastroesophageal reflux disease)   . Hypertension   . Non-small cell carcinoma of lung, right ([place]) 06/24/2015  . Shortness of breath   . Ulcer ([place])     ALLERGIES:  is allergic to aspirin and morphine and related.  MEDICATIONS:  Current Outpatient Prescriptions  Medication Sig Dispense Refill  . albuterol (PROAIR HFA) 108 (90 Base) MCG/ACT inhaler 2 puffs every 4 hours as needed only  if your can't catch your breath 1 Inhaler 1  . Albuterol Sulfate (PROAIR RESPICLICK) 384 (90 BASE) MCG/ACT AEPB Inhale 2 puffs into the lungs every 4 (four) hours as needed. 1 each 11  . budesonide-formoterol (SYMBICORT) 160-4.5 MCG/ACT inhaler  Take 2 puffs first thing in am and then another 2 puffs about 12 hours later. 1 Inhaler 11  . clonazePAM (KLONOPIN) 1 MG tablet Take 1/2 tablet as needed twice a day for anxiety and 1-2 tablets at night as needed for sleep 60 tablet 0  . Guaifenesin (MUCINEX MAXIMUM STRENGTH) 1200 MG TB12 Take 1 tablet (1,200 mg total) by mouth every 12 (twelve) hours as needed. 14 tablet 1  . HYDROcodone-acetaminophen (NORCO/VICODIN) 5-325 MG tablet Take 1-2 tablets by mouth every 6 (six) hours as needed for moderate pain. 120 tablet 0  . lansoprazole (PREVACID) 30 MG capsule Take 1 capsule (30 mg total) by mouth 2 (two) times daily before a meal. 180 capsule 3  . levocetirizine (XYZAL) 5 MG tablet Take 1 tablet (5 mg total) by mouth every evening. 30 tablet 11  . metoprolol tartrate (LOPRESSOR) 25 MG tablet TAKE 1/2 A TABLET BY MOUTH TWICE A DAY 30 tablet 1  . polyethylene glycol (MIRALAX) packet Take 17 g by mouth daily.    . pravastatin (PRAVACHOL) 40 MG tablet TAKE 1 TABLET BY MOUTH AT BEDTIME 90 tablet 0  . predniSONE (DELTASONE) 10 MG tablet Take  4 each am x 2 days,   2 each am x 2 days,  1 each am x 2 days and stop 14 tablet 0  . ranitidine (ZANTAC) 150 MG capsule Take 150 mg by mouth every evening. Take 2 tablets HS    . senna (  SENOKOT) 8.6 MG TABS tablet Take 2 tablets (17.2 mg total) by mouth at bedtime. 120 each 0   No current facility-administered medications for this visit.     SURGICAL HISTORY:  Past Surgical History:  Procedure Laterality Date  . ANTERIOR FUSION LUMBAR SPINE  07/25/2012  . ANTERIOR LAT LUMBAR FUSION Left 07/25/2012   Procedure: ANTERIOR LATERAL LUMBAR FUSION 1 LEVEL;  Surgeon: [name], MD;  Location: [place];  Service: Orthopedics;  Laterality: Left;  Left sided lumbar 3-4 lateral interbody fusion with allograft and instrumentation  . COLONOSCOPY  14   polyps rem  . JOINT REPLACEMENT Right 2010  . NODE DISSECTION Right 05/24/2015   Procedure: NODE DISSECTION;   Surgeon: [name], MD;  Location: [facility];  Service: Thoracic;  Laterality: Right;  . SEGMENTECOMY Right 05/24/2015   Procedure: RIGHT LOWER LOBE SUPERIOR SEGMENTECTOMY;  Surgeon: [name], MD;  Location: [facility];  Service: Thoracic;  Laterality: Right;  . [place]  2012  . VIDEO ASSISTED THORACOSCOPY (VATS)/WEDGE RESECTION Right 05/24/2015   Procedure: RIGHT VIDEO ASSISTED THORACOSCOPY (VATS)/ RIGHTR LOWER LOBE WEDGE RESECTION;  Surgeon: [name], MD;  Location: [place];  Service: Thoracic;  Laterality: Right;    REVIEW OF SYSTEMS:  A comprehensive review of systems was negative except for: Respiratory: positive for cough and dyspnea on exertion   PHYSICAL EXAMINATION: General appearance: alert, cooperative and no distress Head: Normocephalic, without obvious abnormality, atraumatic Neck: no adenopathy, no JVD, supple, symmetrical, trachea midline and thyroid not enlarged, symmetric, no tenderness/mass/nodules Lymph nodes: Cervical, supraclavicular, and axillary nodes normal. Resp: clear to auscultation bilaterally Back: symmetric, no curvature. ROM normal. No CVA tenderness. Cardio: regular rate and rhythm, S1, S2 normal, no murmur, click, rub or gallop GI: soft, non-tender; bowel sounds normal; no masses,  no organomegaly Extremities: extremities normal, atraumatic, no cyanosis or edema  ECOG PERFORMANCE STATUS: 1 - Symptomatic but completely ambulatory  Blood pressure (!) 155/90, pulse 100, temperature 98.2 F (36.8 C), temperature source Oral, resp. rate 17, height '5\' 7"'$  (1.702 m), weight 180 lb 3.2 oz (81.7 kg), SpO2 97 %.  LABORATORY DATA: Lab Results  Component Value Date   WBC 7.8 12/22/2015   HGB 16.6 12/22/2015   HCT 49.0 12/22/2015   MCV 85.4 12/22/2015   PLT 278 12/22/2015      Chemistry      Component Value Date/Time   NA 141 12/22/2015 1321   K 4.7 12/22/2015 1321   CL 102 08/26/2015 1139   CO2 26 12/22/2015 1321   BUN 5.8 (L)  12/22/2015 1321   CREATININE 1.2 12/22/2015 1321      Component Value Date/Time   CALCIUM 10.8 (H) 12/22/2015 1321   ALKPHOS 155 (H) 12/22/2015 1321   AST 18 12/22/2015 1321   ALT 16 12/22/2015 1321   BILITOT 0.47 12/22/2015 1321       RADIOGRAPHIC STUDIES: Ct Chest W Contrast  Result Date: 12/22/2015 CLINICAL DATA:  65 year old male with history of right-sided lung cancer diagnosed in February 2017. Restaging examination. EXAM: CT CHEST WITH CONTRAST TECHNIQUE: Multidetector CT imaging of the chest was performed during intravenous contrast administration. CONTRAST:  18m ISOVUE-300 IOPAMIDOL (ISOVUE-300) INJECTION 61% COMPARISON:  Multiple priors, most recently chest CT 05/10/2015 and PET-CT 05/03/2015. FINDINGS: Cardiovascular: Heart size is normal. There is no significant pericardial fluid, thickening or pericardial calcification. There is aortic atherosclerosis, as well as atherosclerosis of the great vessels of the mediastinum and the coronary arteries, including calcified atherosclerotic  plaque in the left anterior descending coronary artery. Mediastinum/Nodes: 9 mm short axis low left paratracheal lymph node is borderline enlarged, but nonspecific and similar to prior studies. No definite pathologically enlarged mediastinal or hilar lymph nodes are noted on today's examination. Esophagus is unremarkable in appearance. No axillary lymphadenopathy. Lungs/Pleura: There is again a band-like area of architectural distortion throughout the right mid lung involving portions of the lateral segment of the right middle lobe and superior segment of the right lower lobe. In these areas there are also postoperative changes of prior wedge resection. The originally diagnosed right lower lobe nodule is not identified, and there is no new nodule in this region to suggest local recurrence of disease. There is a small amount of ill-defined architectural distortion and nodular appearing airspace disease in the  medial aspect of the left upper lobe, best appreciated on image 35 of series 7, favored to reflect a small area of active infection/inflammation. No other larger areas of airspace consolidation are otherwise noted. No pleural effusions. Mild diffuse bronchial wall thickening with moderate centrilobular and paraseptal emphysema. Upper Abdomen: Unremarkable. Musculoskeletal: There are no aggressive appearing lytic or blastic lesions noted in the visualized portions of the skeleton. IMPRESSION: 1. Stable postoperative and posttreatment related changes in the right lung, without findings to suggest local recurrence of disease or definite metastatic disease in the thorax. 2. New nodular area of apparent airspace consolidation and architectural distortion in the medial aspect of the left upper lobe, strongly favored to be infectious or inflammatory in etiology. Close attention at time of routine follow-up is recommended to ensure the resolution of this finding. 3. Diffuse bronchial wall thickening with moderate centrilobular and paraseptal emphysema; imaging findings suggestive of underlying COPD. 4. Aortic atherosclerosis, in addition to left anterior descending coronary artery disease. Please note that although the presence of coronary artery calcium documents the presence of coronary artery disease, the severity of this disease and any potential stenosis cannot be assessed on this non-gated CT examination. Assessment for potential risk factor modification, dietary therapy or pharmacologic therapy may be warranted, if clinically indicated. Electronically Signed   By: Vinnie Langton M.D.   On: 12/22/2015 16:58    ASSESSMENT AND PLAN: This is a very pleasant 65 years old African-American male with a stage IA non-small cell lung cancer status post wedge resection of the right lower lobe. The patient is currently on observation and feeling fine. The recent CT scan of the chest showed no clear evidence for disease  progression but there was inflammatory process in the left upper lobe. I discussed the scan results with the patient and his wife. I recommended for him to continue on observation with repeat CT scan of the chest in 6 months. For the right upper lobe inflammation, I will start the patient on Z-Pak and if no improvement of his condition he would contact his primary care physician for further evaluation. For hypertension, he was advised to take his blood pressure medication as prescribed and to reconsult with his primary care physician for adjustment of his medication if needed. The patient was advised to call immediately if she has any concerning symptoms in the interval. The patient voices understanding of current disease status and treatment options and is in agreement with the current care plan.  All questions were answered. The patient knows to call the clinic with any problems, questions or concerns. We can certainly see the patient much sooner if necessary.  Disclaimer: This note was dictated with voice recognition  software. Similar sounding words can inadvertently be transcribed and may not be corrected upon review.

## 2015-12-27 NOTE — Telephone Encounter (Signed)
Appointments scheduled per 12/27/15 los. A copy of the AVS report & appointment schedule was given to patient,per 12/27/15 los.

## 2015-12-29 ENCOUNTER — Other Ambulatory Visit: Payer: Self-pay

## 2015-12-29 DIAGNOSIS — J449 Chronic obstructive pulmonary disease, unspecified: Secondary | ICD-10-CM

## 2015-12-29 MED ORDER — BUDESONIDE-FORMOTEROL FUMARATE 160-4.5 MCG/ACT IN AERO: INHALATION_SPRAY | RESPIRATORY_TRACT | 3 refills | Status: DC

## 2016-01-05 ENCOUNTER — Telehealth: Payer: Self-pay | Admitting: Family Medicine

## 2016-01-05 NOTE — Telephone Encounter (Signed)
Pt would like a refill on Norco please respond to patient dr Brigitte Pulse will be out until Monday

## 2016-01-06 NOTE — Telephone Encounter (Signed)
Last OV 10/5 which is when he got his last Rx.

## 2016-01-06 NOTE — Telephone Encounter (Signed)
I will be back on Monday 12/4 in the afternoon so he can pick up rx then.

## 2016-01-08 ENCOUNTER — Telehealth: Payer: Self-pay

## 2016-01-08 NOTE — Telephone Encounter (Signed)
[place] fax requests new prescriptions for 90 day supply be faxed for: Levocetirizine '5mg'$  Lansoprazole '30mg'$  Pravastatin '40mg'$  Metoprolol Tart '25mg'$  (rx by Dr. Roxan Hockey) Clonazepam '1mg'$   THanks.

## 2016-01-09 NOTE — Telephone Encounter (Signed)
Ok to refill all for 1 year supply in the metoprolol other than the clonazepam - no refills on that.

## 2016-01-10 ENCOUNTER — Ambulatory Visit (INDEPENDENT_AMBULATORY_CARE_PROVIDER_SITE_OTHER): Payer: Commercial Managed Care - HMO | Admitting: Internal Medicine

## 2016-01-10 ENCOUNTER — Encounter: Payer: Self-pay | Admitting: Internal Medicine

## 2016-01-10 VITALS — BP 140/90 | HR 84 | Ht 67.0 in | Wt 179.0 lb

## 2016-01-10 DIAGNOSIS — J449 Chronic obstructive pulmonary disease, unspecified: Secondary | ICD-10-CM

## 2016-01-10 LAB — PULMONARY FUNCTION TEST
DL/VA % pred: 47 %
DL/VA: 2.1 ml/min/mmHg/L
DLCO cor % pred: 41 %
DLCO cor: 11.69 ml/min/mmHg
DLCO unc % pred: 43 %
DLCO unc: 12.41 ml/min/mmHg
FEF 25-75 Post: 0.58 L/sec
FEF 25-75 Pre: 0.55 L/sec
FEF2575-%Change-Post: 5 %
FEF2575-%Pred-Post: 23 %
FEF2575-%Pred-Pre: 22 %
FEV1-%Change-Post: 1 %
FEV1-%Pred-Post: 51 %
FEV1-%Pred-Pre: 51 %
FEV1-Post: 1.38 L
FEV1-Pre: 1.36 L
FEV1FVC-%Change-Post: 1 %
FEV1FVC-%Pred-Pre: 60 %
FEV6-%Change-Post: 1 %
FEV6-%Pred-Post: 85 %
FEV6-%Pred-Pre: 84 %
FEV6-Post: 2.85 L
FEV6-Pre: 2.8 L
FEV6FVC-%Change-Post: 1 %
FEV6FVC-%Pred-Post: 103 %
FEV6FVC-%Pred-Pre: 102 %
FVC-%Change-Post: 0 %
FVC-%Pred-Post: 83 %
FVC-%Pred-Pre: 83 %
FVC-Post: 2.91 L
FVC-Pre: 2.9 L
Post FEV1/FVC ratio: 47 %
Post FEV6/FVC ratio: 98 %
Pre FEV1/FVC ratio: 47 %
Pre FEV6/FVC Ratio: 97 %
RV % pred: 152 %
RV: 3.33 L
TLC % pred: 107 %
TLC: 6.87 L

## 2016-01-10 MED ORDER — HYDROCODONE-ACETAMINOPHEN 5-325 MG PO TABS: 1.0000 | ORAL_TABLET | Freq: Four times a day (QID) | ORAL | 0 refills | Status: DC | PRN

## 2016-01-10 MED ORDER — ALBUTEROL SULFATE HFA 108 (90 BASE) MCG/ACT IN AERS: INHALATION_SPRAY | RESPIRATORY_TRACT | 2 refills | Status: DC

## 2016-01-10 NOTE — Telephone Encounter (Signed)
Called the pt and informed him that prescription will be ready today and to come after 1330

## 2016-01-10 NOTE — Patient Instructions (Addendum)
Plan A = Automatic = Symbicort 160 Take 2 puffs first thing in am and then another 2 puffs about 12 hours later.    Work on Doctor, hospital technique:  relax and gently blow all the way out then take a nice smooth deep breath back in, triggering the inhaler at same time you start breathing in.  Hold for up to 5 seconds if you can. Blow out thru nose. Rinse and gargle with water when done     Plan B = Backup Only use your albuterol (proair) as a rescue medication to be used if you can't catch your breath by resting or doing a relaxed purse lip breathing pattern.  - The less you use it, the better it will work when you need it. - Ok to use the inhaler up to 2 puffs  every 4 hours if you must but call for appointment if use goes up over your usual need - Don't leave home without it !!  (think of it like the spare tire for your car)   Please schedule a follow up visit in 3 months but call sooner if needed  Consider laba/lama or adding lama next ov

## 2016-01-10 NOTE — Progress Notes (Signed)
Subjective:    Patient ID: Elijah Marshall, male    DOB: 1950-12-18  MRN: [id number]   Brief patient profile:  45  yobm quit smoking 2013  and able work out at [employer] and did fine until hurt back 2011 then 2 back surgeries and knee surgery  and more noticeable doe  since then but also limited by back and referred to pulmonary clinic 09/05/2013 for ? Copd  With GOLD II criteria 10/29/13 and confirmed 01/10/2016     History of Present Illness  09/05/2013 1st [facility] office visit/ [facility]  Chief Complaint  Patient presents with  . Pulmonary Consult    Referred per Dr. [name].  Pt c/o SOB for the past 3 yrs. He states that he was dxed with COPD back in 2012 or 2013.  He states that that he sometimes has trouble with breathing when walking up stairs and lifting things.    rx spiriva first, alb, dulera not as effective  No problem at rest or supine Assoc nasal congestion much better p shot (?depomedrol) and bad again x sev years corresponds to worse doe  Ex = walking one mile slower pace than wife,trouble with hills, worse in heat  Bad hb even on dexilant  Min dry cough  rec Stop spiriva  Start anoro two puffs off one click each am Zantac 124 mg one at  Bedtime Prednisone 10 mg take  4 each am x 2 days,   2 each am x 2 days,  1 each am x 2 days and stop  GERD diet        10/29/2013 f/u ov/[facility] re:  GOLD II, no need for saba on anoro daily  Chief Complaint  Patient presents with  . Follow-up    Breathing is some better- has not use rescue inhaler since the last visit. He states that he is coughing up minimal yellow sputum and still c/o sinus pressure.   cough worse x sev weeks with nasal congestion not using singulair consistently at all with neg sinus ct noted.  advil cold and sinus helps some. Not limited by breathing from desired activities   rec  singulair trial daily > x one month no better so d/c'd     08/09/2015 NP Acute OV :COPD GOLD II /NSCLC Stage 1A s/p wedge  resection 05/2015  Pt presents for an acute office visit. Complains of 2 weeks of prod cough with white mucus, sinus pressure/congestion x 1 week. Denies any SOB/wheezing, chest congestion/tightness, fever, nausesa or vomiting. Has sinus congestion and pressure, teeth pain on/off.   dx of NSCLC s/p wedge resection in April 2017.  Has planned follow up serial CT chest with Dr. Julien Nordmann coming up soon. rec Augmentin '875mg'$  Twice daily  For 10 days -take with food.  Mucinex DM Twice daily  As needed  Cough/congestion  Saline nasal rinses As needed        11/19/2015  f/u ov/[facility] re:  GOLD II / maint rx symb80/ Chief Complaint  Patient presents with  . Follow-up    pt c/o stuffy nose, PND, prod cough with white/yellow mucus X1-2 weeks.     says needed 02 at highest level of exercise for rehab and wants to have it to continue 02 on maint rx  rec Prednisone 10 mg take  4 each am x 2 days,   2 each am x 2 days,  1 each am x 2 days and stop  Increase symbicort back up to  160 Take 2 puffs first thing in am and then another 2 puffs about 12 hours later.  Only use your albuterol(proair hfa) as a rescue medication    01/10/2016  f/u ov/[facility] re: GOLD II/ symb 160 2bid  Chief Complaint  Patient presents with  . Follow-up    PFT today. Pt states his ears and nose are stuffy. He states his breathing is overall doing well and he is not coughing much.  He is using albuterol inhaler 2 x per wk on average.   MMRC2 = can't walk a nl pace on a flat grade s sob but does fine slow and flat eg shopping / can't do step     No obvious day to day or daytime variability or assoc mucus plugs or hemoptysis or cp or chest tightness, subjective wheeze or overt  hb symptoms. No unusual exp hx or h/o childhood pna/ asthma or knowledge of premature birth.  Sleeping ok without nocturnal  or early am exacerbation  of respiratory  c/o's or need for noct saba. Also denies any obvious fluctuation of symptoms with weather or  environmental changes or other aggravating or alleviating factors except as outlined above   Current Medications, Allergies, Complete Past Medical History, Past Surgical History, Family History, and Social History were reviewed in [employer] record.  ROS  The following are not active complaints unless bolded sore throat, dysphagia, dental problems, itching, sneezing,  nasal congestion or excess/ purulent secretions, ear ache,   fever, chills, sweats, unintended wt loss, classically pleuritic or exertional cp,  orthopnea pnd or leg swelling, presyncope, palpitations, abdominal pain, anorexia, nausea, vomiting, diarrhea  or change in bowel or bladder habits, change in stools or urine, dysuria,hematuria,  rash, arthralgias, visual complaints, headache, numbness, weakness or ataxia or problems with walking or coordination,  change in mood/affect or memory.              Objective:   Physical Exam  amb bm nad   01/10/2016        179    11/19/15 182 lb (82.6 kg)  11/11/15 180 lb (81.6 kg)  11/04/15 181 lb 3.5 oz (82.2 kg)    Vital signs reviewed  - Note on arrival 02 sats  93% on RA    HEENT mild turbinate edema nonspecific bilaterally. Oropharynx no thrush or excess pnd or cobblestoning.  No JVD or cervical adenopathy. Mild accessory muscle hypertrophy. Trachea midline, nl thryroid. Chest was hyperinflated by percussion with diminished breath sounds and mild exp time with minimal end exp wheezes bilaterally . Hoover sign positive at end inspiration. Regular rate and rhythm without murmur gallop or rub or increase P2 or edema.  Abd: no hsm, nl excursion. Ext warm without cyanosis or clubbing.          I personally reviewed images and agree with radiology impression as follows:  CXR:  08/09/15 Expected postoperative changes involving the right lower lobe. No findings for recurrent tumor or acute pulmonary abnormality.

## 2016-01-11 NOTE — Telephone Encounter (Signed)
All on list including metoprolol x 1 yr, no clonazepam refill

## 2016-01-11 NOTE — Telephone Encounter (Signed)
So ok for a year on all meds listed but clonazepam or just metoprolol?

## 2016-01-11 NOTE — Assessment & Plan Note (Addendum)
-   Spirometry  03/01/12  FEV1  1.63 - Spirometry   09/05/13 FEV1  1.34 (46%) ratio 44  - PFTs   10/29/2013    FEV1    1.70 (62%) ratio 50% no better after ssaba and DLCO 48%  - 09/05/2013  Walked RA x 3 laps @ fast pace @  185 ft each stopped due to end of study, fast pace,sat 89% at end  - 01/06/14 gradual worsening on anoro plus increase need for saba  - 01/28/2014   > try spiriva respimat/ symbicort 160 2bid > changed to symbicort 160 2bid only on 01/28/14  - 02/25/2014   90%   > changed to symbicort 80 2bid since hoarse on 160 and clear on exam  - 11/19/2015 rechallenge with symbicort 160 Take 2 puffs first thing in am and then another 2 puffs about 12 hours later.  - PFT's  01/10/2016  FEV1 1.38 (51 % ) ratio 47  p 1 % improvement from saba p symb 160  prior to study with DLCO  43/41  % corrects to 47  % for alv volume   - 01/10/2016  After extensive coaching HFA effectiveness =    75% (Ti too short)   Still gold II but barely and next step would be to either add a lama or consider just lama/laba depending on whether has tendency to flares of copd and on insurance  I had an extended discussion with the patient reviewing all relevant studies completed to date and  lasting 15 to 20 minutes of a 25 minute visit on the following ongoing concerns:  Formulary restrictions will be an ongoing challenge for the forseable future and I would be happy to pick an alternative if the pt will first  provide me a list of them but pt  will need to return here for training for any new device that is required eg dpi vs hfa vs respimat.    In meantime we can always provide samples so the patient never runs out of any needed respiratory medications.   Each maintenance medication was reviewed in detail including most importantly the difference between maintenance and as needed and under what circumstances the prns are to be used.  Please see AVS for  customized  Instructions which were reviewed in detail in writing with the  patient and a copy provided.

## 2016-01-12 MED ORDER — METOPROLOL TARTRATE 25 MG PO TABS: ORAL_TABLET | ORAL | 3 refills | Status: DC

## 2016-01-12 MED ORDER — LEVOCETIRIZINE DIHYDROCHLORIDE 5 MG PO TABS: 5.0000 mg | ORAL_TABLET | Freq: Every evening | ORAL | 3 refills | Status: DC

## 2016-01-12 MED ORDER — LANSOPRAZOLE 30 MG PO CPDR: 30.0000 mg | DELAYED_RELEASE_CAPSULE | Freq: Two times a day (BID) | ORAL | 3 refills | Status: DC

## 2016-01-12 MED ORDER — PRAVASTATIN SODIUM 40 MG PO TABS: 40.0000 mg | ORAL_TABLET | Freq: Every day | ORAL | 3 refills | Status: DC

## 2016-01-12 NOTE — Telephone Encounter (Addendum)
Sent in all Rxs other thatn clonazepam as OKd.

## 2016-01-12 NOTE — Addendum Note (Signed)
Addended by: Elwyn Reach A on: 01/12/2016 01:20 PM   Modules accepted: Orders

## 2016-01-21 ENCOUNTER — Telehealth: Payer: Self-pay

## 2016-01-21 NOTE — Telephone Encounter (Signed)
[facility] had sent notice that PA was needed for lansoprazole. Started on covermymeds and called pt to ask him is he has ever had an endoscopy for GERD Dx. He answered "yes", but stated that he was able to get this med filled at local pharm, and that [facility] mail order has denied 90 day supplies of all of his meds. No PA is needed.

## 2016-01-23 ENCOUNTER — Other Ambulatory Visit: Payer: Self-pay | Admitting: Thoracic Surgery (Cardiothoracic Vascular Surgery)

## 2016-01-28 ENCOUNTER — Other Ambulatory Visit: Payer: Self-pay | Admitting: Family Medicine

## 2016-01-28 NOTE — Telephone Encounter (Signed)
Prevastatin sent 12/6 #90.

## 2016-01-30 MED ORDER — PREDNISONE 20 MG PO TABS: ORAL_TABLET | ORAL | 0 refills | Status: DC

## 2016-01-30 MED ORDER — AMOXICILLIN-POT CLAVULANATE 875-125 MG PO TABS: 1.0000 | ORAL_TABLET | Freq: Two times a day (BID) | ORAL | 0 refills | Status: DC

## 2016-01-30 NOTE — Telephone Encounter (Signed)
Has had 4-5d of worsening sinus pain and pressure - at both cheeks and upper gums. He is completely congested and could not breathe through his nose at all He is not able to blow anything out but is having some pnd causing cough productive of yellow mucus. No epistaxis or hemoptysis. Denies any fevers or chills but is having some nightly diaphoresis. Completed a Z-Pak approximately 5 weeks ago from his oncologist Dr. Earlie Server but prior to that last antibiotic use was 5 months. Patient with well established history of recurrent bacterial sinusitis and COPD exacerbations along with recent history of lung cancer treated with lobectomy so I think it is reasonable to proceed with treatment considering our office is closed for the next 2 days. Start Augmentin and prednisone but if no significant improvement in 2-3 days then recheck in office visit.  Meds ordered this encounter  Medications  .               . amoxicillin-clavulanate (AUGMENTIN) 875-125 MG tablet    Sig: Take 1 tablet by mouth 2 (two) times daily.    Dispense:  28 tablet    Refill:  0  . predniSONE (DELTASONE) 20 MG tablet    Sig: Take 3 tabs qd x 3d, then 2 tabs qd x 3d then 1 tab qd x 3d, then 1/2 tab po qd x 4d.    Dispense:  20 tablet    Refill:  0

## 2016-03-02 DIAGNOSIS — M1612 Unilateral primary osteoarthritis, left hip: Secondary | ICD-10-CM | POA: Diagnosis not present

## 2016-04-10 ENCOUNTER — Ambulatory Visit: Payer: Commercial Managed Care - HMO | Admitting: Internal Medicine

## 2016-04-28 ENCOUNTER — Ambulatory Visit (INDEPENDENT_AMBULATORY_CARE_PROVIDER_SITE_OTHER)
Admission: RE | Admit: 2016-04-28 | Discharge: 2016-04-28 | Disposition: A | Discharge status: Home / Self Care | Payer: Self-pay | Source: Ambulatory Visit | Attending: Internal Medicine | Admitting: Internal Medicine

## 2016-04-28 ENCOUNTER — Telehealth: Payer: Self-pay | Admitting: Family Medicine

## 2016-04-28 ENCOUNTER — Other Ambulatory Visit: Payer: Self-pay | Admitting: Thoracic Surgery (Cardiothoracic Vascular Surgery)

## 2016-04-28 ENCOUNTER — Encounter: Payer: Self-pay | Admitting: Internal Medicine

## 2016-04-28 ENCOUNTER — Ambulatory Visit (INDEPENDENT_AMBULATORY_CARE_PROVIDER_SITE_OTHER): Payer: Self-pay | Admitting: Internal Medicine

## 2016-04-28 VITALS — BP 148/98 | HR 91 | Ht 69.0 in | Wt 185.6 lb

## 2016-04-28 DIAGNOSIS — J449 Chronic obstructive pulmonary disease, unspecified: Secondary | ICD-10-CM

## 2016-04-28 DIAGNOSIS — I10 Essential (primary) hypertension: Secondary | ICD-10-CM

## 2016-04-28 DIAGNOSIS — J9611 Chronic respiratory failure with hypoxia: Secondary | ICD-10-CM

## 2016-04-28 DIAGNOSIS — R61 Generalized hyperhidrosis: Secondary | ICD-10-CM

## 2016-04-28 MED ORDER — METOPROLOL TARTRATE 25 MG PO TABS: ORAL_TABLET | ORAL | 0 refills | Status: DC

## 2016-04-28 NOTE — Patient Instructions (Signed)
No change in medications  Take whole pill of lopressor twice daily when your blood pressure is up  Please remember to go to the  x-ray department downstairs in the basement  for your tests - we will call you with the results when they are available.     Please schedule a follow up visit in 3 months but call sooner if needed

## 2016-04-28 NOTE — Progress Notes (Signed)
Subjective:    Patient ID: Elijah Marshall, male    DOB: 03/01/1950  MRN: [id number]   Brief patient profile:  66  yobm quit smoking 2013  and able work out at [employer] and did fine until hurt back 2011 then 2 back surgeries and knee surgery  and more noticeable doe  since then but also limited by back and referred to pulmonary clinic 09/05/2013 for ? Copd  With GOLD II criteria 10/29/13 and confirmed 01/10/2016     History of Present Illness  09/05/2013 1st [facility] office visit/ Elijah Marshall  Chief Complaint  Patient presents with  . Pulmonary Consult    Referred per Dr. Norberto Sorenson.  Pt c/o SOB for the past 3 yrs. He states that he was dxed with COPD back in 2012 or 2013.  He states that that he sometimes has trouble with breathing when walking up stairs and lifting things.    rx spiriva first, alb, dulera not as effective  No problem at rest or supine Assoc nasal congestion much better p shot (?depomedrol) and bad again x sev years corresponds to worse doe  Ex = walking one mile slower pace than wife,trouble with hills, worse in heat  Bad hb even on dexilant  Min dry cough  rec Stop spiriva  Start anoro two puffs off one click each am Zantac 150 mg one at  Bedtime Prednisone 10 mg take  4 each am x 2 days,   2 each am x 2 days,  1 each am x 2 days and stop  GERD diet   10/29/2013 f/u ov/Elijah Marshall re:  GOLD II, no need for saba on anoro daily  Chief Complaint  Patient presents with  . Follow-up    Breathing is some better- has not use rescue inhaler since the last visit. He states that he is coughing up minimal yellow sputum and still c/o sinus pressure.   cough worse x sev weeks with nasal congestion not using singulair consistently at all with neg sinus ct noted.  advil cold and sinus helps some. Not limited by breathing from desired activities   rec  singulair trial daily > x one month no better so d/c'd      01/10/2016  f/u ov/Elijah Marshall re: GOLD II/ symb 160 2bid  Chief Complaint    Patient presents with  . Follow-up    PFT today. Pt states his ears and nose are stuffy. He states his breathing is overall doing well and he is not coughing much.  He is using albuterol inhaler 2 x per wk on average.   MMRC2 = can't walk a nl pace on a flat grade s sob but does fine slow and flat eg shopping / can't do step  rec Plan A = Automatic = Symbicort 160 Take 2 puffs first thing in am and then another 2 puffs about 12 hours later.  Work on Archivist:  Plan B = Backup Only use your albuterol (proair) as a rescue medication     04/28/2016  f/u ov/Elijah Marshall re:  GOLD II/ symb 160 2bid / has 02 concentrator but not using  Chief Complaint  Patient presents with  . Follow-up    Pt c/o night sweats x 2 weeks. Breathing overall has been okay since last OV. Some days he has increased SOB.    2lpm with walking more than room to room  Doe still = MMRC2 = can't walk a nl pace on a flat grade s  sob but does fine slow and flat   No need for extra saba over baseline     No obvious day to day or daytime variability or assoc excess/ purulent sputum or mucus plugs or hemoptysis or cp or chest tightness, subjective wheeze or overt sinus or hb symptoms. No unusual exp hx or h/o childhood pna/ asthma or knowledge of premature birth.  Sleeping ok without nocturnal  or early am exacerbation  of respiratory  c/o's or need for noct saba. Also denies any obvious fluctuation of symptoms with weather or environmental changes or other aggravating or alleviating factors except as outlined above   Current Medications, Allergies, Complete Past Medical History, Past Surgical History, Family History, and Social History were reviewed in [employer] record.  ROS  The following are not active complaints unless bolded sore throat, dysphagia, dental problems, itching, sneezing,  nasal congestion or excess/ purulent secretions, ear ache,   fever, chills, sweats, unintended wt  loss, classically pleuritic or exertional cp,  orthopnea pnd or leg swelling, presyncope, palpitations, abdominal pain, anorexia, nausea, vomiting, diarrhea  or change in bowel or bladder habits, change in stools or urine, dysuria,hematuria,  rash, arthralgias, visual complaints, headache, numbness, weakness or ataxia or problems with walking or coordination,  change in mood/affect or memory.         Objective:   Physical Exam  amb bm nad   04/28/2016        185  01/10/2016        179    11/19/15 182 lb (82.6 kg)  11/11/15 180 lb (81.6 kg)  11/04/15 181 lb 3.5 oz (82.2 kg)    Vital signs reviewed  - Note on arrival 02 sats  95% on RA and BP 148/98      HEENT: nl dentition, turbinates bilaterally, and oropharynx. Nl external ear canals without cough reflex   NECK :  without JVD/Nodes/TM/ nl carotid upstrokes bilaterally   LUNGS: no acc muscle use,  Nl contour chest with slt distant bs/ no wheeze    CV:  RRR  no s3 or murmur or increase in P2, and no edema   ABD:  soft and nontender with nl inspiratory excursion in the supine position. No bruits or organomegaly appreciated, bowel sounds nl  MS:  Nl gait/ ext warm without deformities, calf tenderness, cyanosis or clubbing No obvious joint restrictions   SKIN: warm and dry without lesions    NEURO:  alert, approp, nl sensorium with  no motor or cerebellar deficits apparent.     CXR PA and Lateral:   04/28/2016 :    I personally reviewed images and agree with radiology impression as follows:   Chronic interstitial lung disease COPD. No acute abnormality identified.

## 2016-04-28 NOTE — Progress Notes (Signed)
Spoke with pt and notified of results per Dr. Wert. Pt verbalized understanding and denied any questions. 

## 2016-04-28 NOTE — Telephone Encounter (Signed)
Pt called needing a refill on metoprolol tartrate (LOPRESSOR) 25 MG tablet [[id number]] until his comes in the mail from mail order he is completely out.  Please advise: [phone]

## 2016-04-30 DIAGNOSIS — R61 Generalized hyperhidrosis: Secondary | ICD-10-CM | POA: Insufficient documentation

## 2016-04-30 NOTE — Assessment & Plan Note (Signed)
SATURATION QUALIFICATIONS:  11/19/2015  Patient Saturations on Room Air at Rest = 96% Patient Saturations on Room Air while Ambulating = 86% Patient Saturations on 2  Liters of oxygen while Ambulating = 96%  Adequate control on present rx, reviewed in detail with pt > no change in rx needed  = 2lpm with walking outside of confines of house

## 2016-04-30 NOTE — Assessment & Plan Note (Signed)
-   Spirometry  03/01/12  FEV1  1.63 - Spirometry   09/05/13 FEV1  1.34 (46%) ratio 44  - PFTs   10/29/2013    FEV1    1.70 (62%) ratio 50% no better after ssaba and DLCO 48%  - 09/05/2013  Walked RA x 3 laps @ fast pace @  185 ft each stopped due to end of study, fast pace,sat 89% at end  - 01/06/14 gradual worsening on anoro plus increase need for saba  - 01/28/2014   > try spiriva respimat/ symbicort 160 2bid > changed to symbicort 160 2bid only on 01/28/14  - 02/25/2014   90%   > changed to symbicort 80 2bid since hoarse on 160 and clear on exam  - 11/19/2015 rechallenge with symbicort 160 Take 2 puffs first thing in am and then another 2 puffs about 12 hours later.  - PFT's  01/10/2016  FEV1 1.38 (51 % ) ratio 47  p 1 % improvement from saba p symb 160  prior to study with DLCO  43/41  % corrects to 47  % for alv volume   - 01/10/2016  After extensive coaching HFA effectiveness =    75% (Ti too short)    Adequate control on present rx, reviewed in detail with pt > no change in rx needed    Each maintenance medication was reviewed in detail including most importantly the difference between maintenance and as needed and under what circumstances the prns are to be used.  Please see AVS for specific  Instructions which are unique to this visit and I personally typed out  which were reviewed in detail in writing with the patient and a copy provided.

## 2016-04-30 NOTE — Assessment & Plan Note (Signed)
Self monitors, advised to take 50 mg bid when over 135/85   Should not be a problem with Beta agonists but if so   prefer in this setting: Bystolic, the most beta -1  selective Beta blocker available in sample form, with bisoprolol the most selective generic choice  on the market > Follow up per Primary Care planned

## 2016-04-30 NOTE — Assessment & Plan Note (Addendum)
No pulmonary cause identified, no assoc wt loss is good prognostic sign  > Follow up per Primary Care planned

## 2016-05-11 ENCOUNTER — Telehealth: Payer: Self-pay | Admitting: Internal Medicine

## 2016-05-11 NOTE — Telephone Encounter (Signed)
Will forward to [place] to complete. Pt aware that if we need any additional information we will let him know.

## 2016-05-12 NOTE — Telephone Encounter (Signed)
Spoke with Elijah Marshall at [facility]  He states pt needing a whole new o2 start since he has had ins change  Spoke with the pt and scheduled appt  Nothing further needed

## 2016-05-16 ENCOUNTER — Ambulatory Visit (INDEPENDENT_AMBULATORY_CARE_PROVIDER_SITE_OTHER): Payer: Medicare Other | Admitting: Internal Medicine

## 2016-05-16 ENCOUNTER — Encounter: Payer: Self-pay | Admitting: Internal Medicine

## 2016-05-16 ENCOUNTER — Other Ambulatory Visit (INDEPENDENT_AMBULATORY_CARE_PROVIDER_SITE_OTHER): Payer: Medicare Other

## 2016-05-16 VITALS — BP 128/84 | HR 86 | Ht 69.0 in | Wt 181.0 lb

## 2016-05-16 DIAGNOSIS — J302 Other seasonal allergic rhinitis: Secondary | ICD-10-CM

## 2016-05-16 DIAGNOSIS — J449 Chronic obstructive pulmonary disease, unspecified: Secondary | ICD-10-CM | POA: Diagnosis not present

## 2016-05-16 DIAGNOSIS — J9611 Chronic respiratory failure with hypoxia: Secondary | ICD-10-CM

## 2016-05-16 LAB — CBC WITH DIFFERENTIAL/PLATELET
Basophils Absolute: 0 10*3/uL (ref 0.0–0.1)
Basophils Relative: 0.4 % (ref 0.0–3.0)
Eosinophils Absolute: 0.1 10*3/uL (ref 0.0–0.7)
Eosinophils Relative: 1.8 % (ref 0.0–5.0)
HCT: 55.2 % — ABNORMAL HIGH (ref 39.0–52.0)
Hemoglobin: 18.2 g/dL (ref 13.0–17.0)
Lymphocytes Relative: 29.6 % (ref 12.0–46.0)
Lymphs Abs: 1.9 10*3/uL (ref 0.7–4.0)
MCHC: 33 g/dL (ref 30.0–36.0)
MCV: 88.1 fl (ref 78.0–100.0)
Monocytes Absolute: 0.7 10*3/uL (ref 0.1–1.0)
Monocytes Relative: 11.7 % (ref 3.0–12.0)
Neutro Abs: 3.6 10*3/uL (ref 1.4–7.7)
Neutrophils Relative %: 56.5 % (ref 43.0–77.0)
Platelets: 257 10*3/uL (ref 150.0–400.0)
RBC: 6.26 Mil/uL — ABNORMAL HIGH (ref 4.22–5.81)
RDW: 14.6 % (ref 11.5–15.5)
WBC: 6.3 10*3/uL (ref 4.0–10.5)

## 2016-05-16 MED ORDER — PREDNISONE 10 MG PO TABS: ORAL_TABLET | ORAL | 0 refills | Status: DC

## 2016-05-16 NOTE — Patient Instructions (Addendum)
Prednisone 10 mg take 4 each am x 2 days,   2 each am x 2 days,  1 each am x 2 days and stop   02 should be 2lpm with activity and as needed at rest - we will have you checked also for nocturnal 02 needs   As needed for itching / sneezing / runny nose > xyzal as neeed   If not effective then stop xyzal >> For drainage / throat tickle try take CHLORPHENIRAMINE  4 mg - take one every 4 hours as needed - available over the counter- may cause drowsiness so start with just a bedtime dose or two and see how you tolerate it before trying in daytime    Please remember to go to the lab department downstairs in the basement  for your tests - we will call you with the results when they are available.  Change your appt :  Please schedule a follow up visit in 3 months but call sooner if needed

## 2016-05-16 NOTE — Progress Notes (Signed)
Subjective:    Patient ID: Elijah Marshall, male    DOB: 06-Feb-1951  MRN: [id number]   Brief patient profile:  66  yobm quit smoking 2013  and able work out at [employer] and did fine until hurt back 2011 then 2 back surgeries and knee surgery  and more noticeable doe  since then but also limited by back and referred to pulmonary clinic 09/05/2013 for ? Copd  With GOLD II criteria 10/29/13 and confirmed 01/10/2016     History of Present Illness  09/05/2013 1st [facility] office visit/ Elijah Marshall  Chief Complaint  Patient presents with  . Pulmonary Consult    Referred per Dr. Norberto Marshall.  Pt c/o SOB for the past 3 yrs. He states that he was dxed with COPD back in 2012 or 2013.  He states that that he sometimes has trouble with breathing when walking up stairs and lifting things.    rx spiriva first, alb, dulera not as effective  No problem at rest or supine Assoc nasal congestion much better p shot (?depomedrol) and bad again x sev years corresponds to worse doe  Ex = walking one mile slower pace than wife,trouble with hills, worse in heat  Bad hb even on dexilant  Min dry cough  rec Stop spiriva  Start anoro two puffs off one click each am Zantac 150 mg one at  Bedtime Prednisone 10 mg take  4 each am x 2 days,   2 each am x 2 days,  1 each am x 2 days and stop  GERD diet   10/29/2013 f/u ov/Elijah Marshall re:  GOLD II, no need for saba on anoro daily  Chief Complaint  Patient presents with  . Follow-up    Breathing is some better- has not use rescue inhaler since the last visit. He states that he is coughing up minimal yellow sputum and still c/o sinus pressure.   cough worse x sev weeks with nasal congestion not using singulair consistently at all with neg sinus ct noted.  advil cold and sinus helps some. Not limited by breathing from desired activities   rec  singulair trial daily > x one month no better so d/c'd      01/10/2016  f/u ov/Elijah Marshall re: GOLD II/ symb 160 2bid  Chief Complaint    Patient presents with  . Follow-up    PFT today. Pt states his ears and nose are stuffy. He states his breathing is overall doing well and he is not coughing much.  He is using albuterol inhaler 2 x per wk on average.   MMRC2 = can't walk a nl pace on a flat grade s sob but does fine slow and flat eg shopping / can't do step  rec Plan A = Automatic = Symbicort 160 Take 2 puffs first thing in am and then another 2 puffs about 12 hours later.  Work on Archivist:  Plan B = Backup Only use your albuterol (proair) as a rescue medication     05/16/2016  f/u ov/Elijah Marshall re:  GOLD II/ symb 160 2bid / has 02  conc for prn use  Chief Complaint  Patient presents with  . Follow-up    Pt needs o2 recert due to insurance change. He c/o raspy voice and watery eyes- relates to allergies. He also c/o night sweats recently.   maint on xyzal year round  Only using saba maybe once a week Doe still = MMRC2 = can't walk a  nl pace on a flat grade s sob but does fine slow and flat   No obvious day to day or daytime variability or assoc excess/ purulent sputum or mucus plugs or hemoptysis or cp or chest tightness, subjective wheeze or overt sinus or hb symptoms. No unusual exp hx or h/o childhood pna/ asthma or knowledge of premature birth.  Sleeping ok without nocturnal  or early am exacerbation  of respiratory  c/o's or need for noct saba. Also denies any obvious fluctuation of symptoms with weather or environmental changes or other aggravating or alleviating factors except as outlined above   Current Medications, Allergies, Complete Past Medical History, Past Surgical History, Family History, and Social History were reviewed in [employer] record.  ROS  The following are not active complaints unless bolded sore throat, dysphagia, dental problems, itching, sneezing,  nasal congestion or excess/ purulent secretions, ear ache,   fever, chills, sweats, unintended wt loss,  classically pleuritic or exertional cp,  orthopnea pnd or leg swelling, presyncope, palpitations, abdominal pain, anorexia, nausea, vomiting, diarrhea  or change in bowel or bladder habits, change in stools or urine, dysuria,hematuria,  rash, arthralgias, visual complaints, headache, numbness, weakness or ataxia or problems with walking or coordination,  change in mood/affect or memory.                 Objective:   Physical Exam  amb bm nad    05/16/2016        181  04/28/2016        185  01/10/2016        179    11/19/15 182 lb (82.6 kg)  11/11/15 180 lb (81.6 kg)  11/04/15 181 lb 3.5 oz (82.2 kg)    Vital signs reviewed  - Note on arrival 02 sats  88% on RA     HEENT: nl dentition and oropharynx. Nl external ear canals without cough reflex  - moderate bilateral non-specific turbinate edema     NECK :  without JVD/Nodes/TM/ nl carotid upstrokes bilaterally   LUNGS: no acc muscle use,  Nl contour chest with slt distant bs/ no wheeze    CV:  RRR  no s3 or murmur or increase in P2, and no edema   ABD:  soft and nontender with nl inspiratory excursion in the supine position. No bruits or organomegaly appreciated, bowel sounds nl  MS:  Nl gait/ ext warm without deformities, calf tenderness, cyanosis or clubbing No obvious joint restrictions   SKIN: warm and dry without lesions    NEURO:  alert, approp, nl sensorium with  no motor or cerebellar deficits apparent.     CXR PA and Lateral:   04/28/2016 :    I personally reviewed images and agree with radiology impression as follows:   Chronic interstitial lung disease COPD. No acute abnormality identified.       Labs ordered 05/16/2016    Allergy profile

## 2016-05-17 LAB — RESPIRATORY ALLERGY PROFILE REGION II ~~LOC~~
Allergen, A. alternata, m6: <0.1 kU/L
Allergen, C. Herbarum, M2: <0.1 kU/L
Allergen, Cedar tree, t12: <0.1 kU/L
Allergen, Comm Silver Birch, t9: <0.1 kU/L
Allergen, Cottonwood, t14: <0.1 kU/L
Allergen, D pternoyssinus,d7: <0.1 kU/L
Allergen, Mouse Urine Protein, e78: <0.1 kU/L
Allergen, Mulberry, t76: <0.1 kU/L
Allergen, Oak,t7: <0.1 kU/L
Allergen, P. notatum, m1: <0.1 kU/L
Aspergillus fumigatus, m3: <0.1 kU/L
Bermuda Grass: <0.1 kU/L
Box Elder IgE: <0.1 kU/L
Cat Dander: <0.1 kU/L
Cockroach: <0.1 kU/L
Common Ragweed: <0.1 kU/L
D. farinae: <0.1 kU/L
Dog Dander: <0.1 kU/L
Elm IgE: <0.1 kU/L
IgE (Immunoglobulin E), Serum: 14 kU/L (ref <115)
Johnson Grass: <0.1 kU/L
Pecan/Hickory Tree IgE: <0.1 kU/L
Rough Pigweed  IgE: <0.1 kU/L
Sheep Sorrel IgE: <0.1 kU/L
Timothy Grass: <0.1 kU/L

## 2016-05-18 ENCOUNTER — Telehealth: Payer: Self-pay | Admitting: Internal Medicine

## 2016-05-18 NOTE — Telephone Encounter (Signed)
Notes recorded by Rosana Berger, CMA on 05/18/2016 at 10:15 AM EDT [facility] ------  Notes recorded by [name], MD on 05/17/2016 at 7:45 PM EDT Call patient : Studies are unremarkable for allergies, show blood is thicker than nl which is typically from lack of 02 so wear it as much as possible until next ov and we'll recheck it then ( 3 m if no f/u already planned in meantime   Pt is aware of results. No further questions or concerns at this time.

## 2016-05-18 NOTE — Progress Notes (Signed)
LMTCB

## 2016-05-19 NOTE — Progress Notes (Signed)
Spoke with pt and notified of results per Dr. Wert. Pt verbalized understanding and denied any questions. 

## 2016-05-21 NOTE — Assessment & Plan Note (Addendum)
SATURATION QUALIFICATIONS:  11/19/2015  Patient Saturations on Room Air at Rest = 96% Patient Saturations on Room Air while Ambulating = 86% Patient Saturations on 2  Liters of oxygen while Ambulating = 96% - 05/16/2016 :  Patient Saturations on Room Air at Rest = 88%--- increased to 99% on 2lpm continuous o2//lmr  Developing borderline polycythemia so encourage to use 02 as much as possible and need to also check to make sure has adequate sats nocturnally > ordered

## 2016-05-21 NOTE — Assessment & Plan Note (Addendum)
Sinus ct 10/22/13  Normal paranasal sinuses. - rechallenge with singulair 10/29/2013 > d/c 01/28/2014 as not better  - repeat sinus CT 04/08/15 > neg  - Allergy profile 05/16/2016 >  Eos 0. 1/  IgE  14 neg RAST  Reviewed use of antihistamines is and always has been prn, not as maint rx unless truly does have daily symptoms   For mild flare rec Prednisone 10 mg take  4 each am x 2 days,   2 each am x 2 days,  1 each am x 2 days and stop / no specific allergy identified   I had an extended discussion with the patient reviewing all relevant studies completed to date and  lasting 15 to 20 minutes of a 25 minute visit    Each maintenance medication was reviewed in detail including most importantly the difference between maintenance and prns and under what circumstances the prns are to be triggered using an action plan format that is not reflected in the computer generated alphabetically organized AVS.    Please see AVS for specific instructions unique to this visit that I personally wrote and verbalized to the the pt in detail and then reviewed with pt  by my nurse highlighting any  changes in therapy recommended at today's visit to their plan of care.

## 2016-05-21 NOTE — Assessment & Plan Note (Signed)
-   Spirometry  03/01/12  FEV1  1.63 - Spirometry   09/05/13 FEV1  1.34 (46%) ratio 44  - PFTs   10/29/2013    FEV1    1.70 (62%) ratio 50% no better after ssaba and DLCO 48%  - 09/05/2013  Walked RA x 3 laps @ fast pace @  185 ft each stopped due to end of study, fast pace,sat 89% at end  - 01/06/14 gradual worsening on anoro plus increase need for saba  - 01/28/2014   > try spiriva respimat/ symbicort 160 2bid > changed to symbicort 160 2bid only on 01/28/14  - 02/25/2014   90%   > changed to symbicort 80 2bid since hoarse on 160 and clear on exam  - 11/19/2015 rechallenge with symbicort 160 Take 2 puffs first thing in am and then another 2 puffs about 12 hours later.  - PFT's  01/10/2016  FEV1 1.38 (51 % ) ratio 47  p 1 % improvement from saba p symb 160  prior to study with DLCO  43/41  % corrects to 47  % for alv volume    - 05/16/2016  After extensive coaching HFA effectiveness =    75%    Adequate control on present rx, reviewed in detail with pt > no change in rx needed

## 2016-05-22 ENCOUNTER — Other Ambulatory Visit: Payer: Self-pay | Admitting: Thoracic Surgery (Cardiothoracic Vascular Surgery)

## 2016-05-22 DIAGNOSIS — C3490 Malignant neoplasm of unspecified part of unspecified bronchus or lung: Secondary | ICD-10-CM

## 2016-05-23 ENCOUNTER — Ambulatory Visit (INDEPENDENT_AMBULATORY_CARE_PROVIDER_SITE_OTHER): Payer: Medicare Other | Admitting: Thoracic Surgery (Cardiothoracic Vascular Surgery)

## 2016-05-23 ENCOUNTER — Encounter: Payer: Self-pay | Admitting: Thoracic Surgery (Cardiothoracic Vascular Surgery)

## 2016-05-23 ENCOUNTER — Ambulatory Visit
Admission: RE | Admit: 2016-05-23 | Discharge: 2016-05-23 | Disposition: A | Discharge status: Home / Self Care | Payer: Medicare Other | Source: Ambulatory Visit | Attending: Thoracic Surgery (Cardiothoracic Vascular Surgery) | Admitting: Thoracic Surgery (Cardiothoracic Vascular Surgery)

## 2016-05-23 VITALS — BP 156/97 | Resp 16 | Ht 69.0 in | Wt 181.0 lb

## 2016-05-23 DIAGNOSIS — C3490 Malignant neoplasm of unspecified part of unspecified bronchus or lung: Secondary | ICD-10-CM

## 2016-05-23 DIAGNOSIS — Z09 Encounter for follow-up examination after completed treatment for conditions other than malignant neoplasm: Secondary | ICD-10-CM

## 2016-05-23 DIAGNOSIS — C3491 Malignant neoplasm of unspecified part of right bronchus or lung: Secondary | ICD-10-CM

## 2016-05-23 DIAGNOSIS — R0602 Shortness of breath: Secondary | ICD-10-CM | POA: Diagnosis not present

## 2016-05-23 NOTE — Progress Notes (Signed)
[address]       [place] [address]             [phone]    HPI: Elijah Marshall returns for a scheduled 1 year follow-up visit. Mr. Lagrand is a 66 year old gentleman who has a past history significant for tobacco abuse, COPD, gastroesophageal reflux, hypertension, and arthritis. I did a thoracoscopic right lower lobe superior segmentectomy in April 2017 for stage IA non-small cell carcinoma. He went home on 2 L nasal cannula afterwards. He has been using oxygen with heavy exertion ever since.  He has occasional pains related to his incision does not take any medication for that. He has been taking tramadol for hip pain. He continues to use oxygen during exertion. He was recently advised by Dr. Melvyn Novas to use it frequently as his hematocrit had increased. His appetite is good. He has not had any significant weight loss. Denies any unusual headaches or visual changes.  Past Medical History:  Diagnosis Date  . Allergy   . Arthritis   . Cancer [facility])    unsure at this time  . Cancer of lower lobe of right lung ([place])   . COPD (chronic obstructive pulmonary disease) ([place])    per patient's health survey - he put a ?  . Depression   . GERD (gastroesophageal reflux disease)   . Hypertension   . Non-small cell carcinoma of lung, right ([place]) 06/24/2015  . Shortness of breath   . Ulcer     Current Outpatient Prescriptions  Medication Sig Dispense Refill  . albuterol (PROAIR HFA) 108 (90 Base) MCG/ACT inhaler 2 puffs every 4 hours as needed only  if your can't catch your breath 1 Inhaler 2  . budesonide-formoterol (SYMBICORT) 160-4.5 MCG/ACT inhaler Take 2 puffs first thing in am and then another 2 puffs about 12 hours later. 3 Inhaler 3  . clonazePAM (KLONOPIN) 1 MG tablet Take 1/2 tablet as needed twice a day for anxiety and 1-2 tablets at night as needed for sleep 60 tablet 0  . Guaifenesin (MUCINEX MAXIMUM STRENGTH) 1200 MG TB12 Take 1 tablet (1,200 mg total) by mouth  every 12 (twelve) hours as needed. 14 tablet 1  . lansoprazole (PREVACID) 30 MG capsule Take 1 capsule (30 mg total) by mouth 2 (two) times daily before a meal. 180 capsule 3  . levocetirizine (XYZAL) 5 MG tablet Take 1 tablet (5 mg total) by mouth every evening. 90 tablet 3  . metoprolol tartrate (LOPRESSOR) 25 MG tablet TAKE 1/2 A TABLET BY MOUTH TWICE A DAY 10 tablet 0  . polyethylene glycol (MIRALAX) packet Take 17 g by mouth daily.    . pravastatin (PRAVACHOL) 40 MG tablet Take 1 tablet (40 mg total) by mouth at bedtime. 90 tablet 3  . ranitidine (ZANTAC) 150 MG capsule Take 150 mg by mouth every evening. Take 2 tablets HS    . senna (SENOKOT) 8.6 MG TABS tablet Take 2 tablets (17.2 mg total) by mouth at bedtime. 120 each 0   No current facility-administered medications for this visit.     Physical Exam BP (!) 156/97 (BP Location: Right Arm, Patient Position: Sitting, Cuff Size: Large)   Resp 16   Ht '5\' 9"'$  (1.753 m)   Wt 181 lb (82.1 kg)   SpO2 94% Comment: RA  BMI 26.8 kg/m  66 year old man in no acute distress Alert and oriented 3 with no focal deficits No cervical or supraclavicular adenopathy Lungs clear with  equal breath sounds bilaterally Cardiac regular rate and rhythm normal S1 and S2  Diagnostic Tests: CHEST  2 VIEW  COMPARISON:  04/28/2016  FINDINGS: Cardiac shadow is stable. The lungs are again hyperinflated consistent with COPD. Postsurgical changes are noted on the right. No acute bony abnormality is noted.  IMPRESSION: Postoperative scarring on the right.  No acute abnormality noted.   Electronically Signed   By: Inez Catalina M.D.   On: 05/23/2016 13:08 I personally reviewed the CXR and concur with the findings noted above  Impression: Elijah Marshall is a 66 year old gentleman who is now a year out from a right lower lobe superior segmentectomy for stage IA non-small cell carcinoma. Physical exam and chest x-ray today revealed no evidence  recurrent disease. He has an appointment with Dr. Julien Nordmann to have a CT scan done next month.  COPD- I again encouraged him is Dr. Melvyn Novas has to uses oxygen more frequently.  Hypertension- he is only on 12.5 mg of Lopressor twice daily. I recommend he follow-up with Dr. Brigitte Pulse regarding his blood pressure.  Plan: Follow-up is scheduled with Dr. Julien Nordmann next month I will plan to see him back in a year with a PA and lateral chest x-ray  [name], MD [facility] [phone]

## 2016-05-25 DIAGNOSIS — M1612 Unilateral primary osteoarthritis, left hip: Secondary | ICD-10-CM | POA: Diagnosis not present

## 2016-05-29 DIAGNOSIS — N5201 Erectile dysfunction due to arterial insufficiency: Secondary | ICD-10-CM | POA: Diagnosis not present

## 2016-05-29 DIAGNOSIS — N400 Enlarged prostate without lower urinary tract symptoms: Secondary | ICD-10-CM | POA: Diagnosis not present

## 2016-06-21 DIAGNOSIS — M7062 Trochanteric bursitis, left hip: Secondary | ICD-10-CM | POA: Diagnosis not present

## 2016-06-21 DIAGNOSIS — M1612 Unilateral primary osteoarthritis, left hip: Secondary | ICD-10-CM | POA: Diagnosis not present

## 2016-06-23 ENCOUNTER — Other Ambulatory Visit (HOSPITAL_BASED_OUTPATIENT_CLINIC_OR_DEPARTMENT_OTHER): Payer: Medicare Other

## 2016-06-23 DIAGNOSIS — Z85118 Personal history of other malignant neoplasm of bronchus and lung: Secondary | ICD-10-CM | POA: Diagnosis present

## 2016-06-23 DIAGNOSIS — C3491 Malignant neoplasm of unspecified part of right bronchus or lung: Secondary | ICD-10-CM

## 2016-06-23 LAB — CBC WITH DIFFERENTIAL/PLATELET
BASO%: 0.6 % (ref 0.0–2.0)
Basophils Absolute: 0 10*3/uL (ref 0.0–0.1)
EOS%: 1.3 % (ref 0.0–7.0)
Eosinophils Absolute: 0.1 10*3/uL (ref 0.0–0.5)
HCT: 47.8 % (ref 38.4–49.9)
HGB: 15.6 g/dL (ref 13.0–17.1)
LYMPH%: 31 % (ref 14.0–49.0)
MCH: 29.1 pg (ref 27.2–33.4)
MCHC: 32.7 g/dL (ref 32.0–36.0)
MCV: 88.9 fL (ref 79.3–98.0)
MONO#: 0.7 10*3/uL (ref 0.1–0.9)
MONO%: 10.3 % (ref 0.0–14.0)
NEUT#: 4.1 10*3/uL (ref 1.5–6.5)
NEUT%: 56.8 % (ref 39.0–75.0)
Platelets: 225 10*3/uL (ref 140–400)
RBC: 5.37 10*6/uL (ref 4.20–5.82)
RDW: 13.7 % (ref 11.0–14.6)
WBC: 7.2 10*3/uL (ref 4.0–10.3)
lymph#: 2.2 10*3/uL (ref 0.9–3.3)

## 2016-06-23 LAB — COMPREHENSIVE METABOLIC PANEL
ALT: 19 U/L (ref 0–55)
AST: 21 U/L (ref 5–34)
Albumin: 4.2 g/dL (ref 3.5–5.0)
Alkaline Phosphatase: 103 U/L (ref 40–150)
Anion Gap: 8 mEq/L (ref 3–11)
BUN: 4.5 mg/dL — ABNORMAL LOW (ref 7.0–26.0)
CO2: 28 mEq/L (ref 22–29)
Calcium: 9.7 mg/dL (ref 8.4–10.4)
Chloride: 106 mEq/L (ref 98–109)
Creatinine: 1 mg/dL (ref 0.7–1.3)
EGFR: 87 mL/min/{1.73_m2} — ABNORMAL LOW (ref >90)
Glucose: 83 mg/dl (ref 70–140)
Potassium: 3.8 mEq/L (ref 3.5–5.1)
Sodium: 143 mEq/L (ref 136–145)
Total Bilirubin: 0.5 mg/dL (ref 0.20–1.20)
Total Protein: 7.2 g/dL (ref 6.4–8.3)

## 2016-06-26 ENCOUNTER — Ambulatory Visit (HOSPITAL_BASED_OUTPATIENT_CLINIC_OR_DEPARTMENT_OTHER): Payer: Medicare Other | Admitting: Internal Medicine

## 2016-06-26 ENCOUNTER — Encounter: Payer: Self-pay | Admitting: Internal Medicine

## 2016-06-26 ENCOUNTER — Telehealth: Payer: Self-pay | Admitting: Internal Medicine

## 2016-06-26 VITALS — BP 156/95 | HR 93 | Temp 97.8°F | Resp 18 | Ht 69.0 in | Wt 185.4 lb

## 2016-06-26 DIAGNOSIS — C3491 Malignant neoplasm of unspecified part of right bronchus or lung: Secondary | ICD-10-CM

## 2016-06-26 DIAGNOSIS — Z85118 Personal history of other malignant neoplasm of bronchus and lung: Secondary | ICD-10-CM | POA: Diagnosis not present

## 2016-06-26 NOTE — Telephone Encounter (Signed)
Scheduled appt per 5/21 los - patient aware and reminder letter sent in the mail.

## 2016-06-26 NOTE — Progress Notes (Signed)
[place] [phone]   [phone]  OFFICE PROGRESS NOTE  [name], MD [address] [place] [address]  DIAGNOSIS: Stage IA (T1a, N0, M0) non-small cell lung cancer, moderately differentiated squamous cell carcinoma presented with right lower lobe lung nodule.   PRIOR THERAPY: Status post wedge resection of the right lower lobe in April 2014 under the care of Dr. Roxan Hockey.  CURRENT THERAPY: Observation.  INTERVAL HISTORY: Elijah Marshall 66 y.o. male returns to the clinic today for six-month follow-up visit. The patient is feeling fine with no specific complaints. He denied having any chest pain, shortness of breath, cough or hemoptysis. He has no weight loss or night sweats. He has no nausea or vomiting. He was supposed to have repeat CT scan of the chest for this visit but unfortunately he did not receive a call to schedule the scan. He is here today for evaluation and recommendation regarding his condition.   MEDICAL HISTORY: Past Medical History:  Diagnosis Date  . Allergy   . Arthritis   . Cancer [facility])    unsure at this time  . Cancer of lower lobe of right lung ([place])   . COPD (chronic obstructive pulmonary disease) ([place])    per patient's health survey - he put a ?  . Depression   . GERD (gastroesophageal reflux disease)   . Hypertension   . Non-small cell carcinoma of lung, right ([place]) 06/24/2015  . Shortness of breath   . Ulcer     ALLERGIES:  is allergic to aspirin and morphine and related.  MEDICATIONS:  Current Outpatient Prescriptions  Medication Sig Dispense Refill  . albuterol (PROAIR HFA) 108 (90 Base) MCG/ACT inhaler 2 puffs every 4 hours as needed only  if your can't catch your breath 1 Inhaler 2  . budesonide-formoterol (SYMBICORT) 160-4.5 MCG/ACT inhaler Take 2 puffs first thing in am and then another 2 puffs about 12 hours later. 3 Inhaler 3  . clonazePAM (KLONOPIN) 1 MG tablet Take 1/2 tablet as  needed twice a day for anxiety and 1-2 tablets at night as needed for sleep 60 tablet 0  . Guaifenesin (MUCINEX MAXIMUM STRENGTH) 1200 MG TB12 Take 1 tablet (1,200 mg total) by mouth every 12 (twelve) hours as needed. 14 tablet 1  . lansoprazole (PREVACID) 30 MG capsule Take 1 capsule (30 mg total) by mouth 2 (two) times daily before a meal. 180 capsule 3  . levocetirizine (XYZAL) 5 MG tablet Take 1 tablet (5 mg total) by mouth every evening. 90 tablet 3  . metoprolol tartrate (LOPRESSOR) 25 MG tablet TAKE 1/2 A TABLET BY MOUTH TWICE A DAY 10 tablet 0  . polyethylene glycol (MIRALAX) packet Take 17 g by mouth daily.    . pravastatin (PRAVACHOL) 40 MG tablet Take 1 tablet (40 mg total) by mouth at bedtime. 90 tablet 3  . ranitidine (ZANTAC) 150 MG capsule Take 150 mg by mouth every evening. Take 2 tablets HS    . senna (SENOKOT) 8.6 MG TABS tablet Take 2 tablets (17.2 mg total) by mouth at bedtime. 120 each 0   No current facility-administered medications for this visit.     SURGICAL HISTORY:  Past Surgical History:  Procedure Laterality Date  . ANTERIOR FUSION LUMBAR SPINE  07/25/2012  . ANTERIOR LAT LUMBAR FUSION Left 07/25/2012   Procedure: ANTERIOR LATERAL LUMBAR FUSION 1 LEVEL;  Surgeon: [name], MD;  Location: [place];  Service: Orthopedics;  Laterality:  Left;  Left sided lumbar 3-4 lateral interbody fusion with allograft and instrumentation  . COLONOSCOPY  14   polyps rem  . JOINT REPLACEMENT Right 2010  . NODE DISSECTION Right 05/24/2015   Procedure: NODE DISSECTION;  Surgeon: [name], MD;  Location: [place];  Service: Thoracic;  Laterality: Right;  . SEGMENTECOMY Right 05/24/2015   Procedure: RIGHT LOWER LOBE SUPERIOR SEGMENTECTOMY;  Surgeon: [name], MD;  Location: [place];  Service: Thoracic;  Laterality: Right;  . [place]  2012  . VIDEO ASSISTED THORACOSCOPY (VATS)/WEDGE RESECTION Right 05/24/2015   Procedure: RIGHT VIDEO ASSISTED THORACOSCOPY  (VATS)/ RIGHTR LOWER LOBE WEDGE RESECTION;  Surgeon: [name], MD;  Location: [facility];  Service: Thoracic;  Laterality: Right;    REVIEW OF SYSTEMS:  A comprehensive review of systems was negative.   PHYSICAL EXAMINATION: General appearance: alert, cooperative and no distress Head: Normocephalic, without obvious abnormality, atraumatic Neck: no adenopathy, no JVD, supple, symmetrical, trachea midline and thyroid not enlarged, symmetric, no tenderness/mass/nodules Lymph nodes: Cervical, supraclavicular, and axillary nodes normal. Resp: clear to auscultation bilaterally Back: symmetric, no curvature. ROM normal. No CVA tenderness. Cardio: regular rate and rhythm, S1, S2 normal, no murmur, click, rub or gallop GI: soft, non-tender; bowel sounds normal; no masses,  no organomegaly Extremities: extremities normal, atraumatic, no cyanosis or edema  ECOG PERFORMANCE STATUS: 1 - Symptomatic but completely ambulatory  Blood pressure (!) 156/95, pulse 93, temperature 97.8 F (36.6 C), temperature source Oral, resp. rate 18, height '5\' 9"'$  (1.753 m), weight 185 lb 6.4 oz (84.1 kg), SpO2 98 %.  LABORATORY DATA: Lab Results  Component Value Date   WBC 7.2 06/23/2016   HGB 15.6 06/23/2016   HCT 47.8 06/23/2016   MCV 88.9 06/23/2016   PLT 225 06/23/2016      Chemistry      Component Value Date/Time   NA 143 06/23/2016 1127   K 3.8 06/23/2016 1127   CL 102 08/26/2015 1139   CO2 28 06/23/2016 1127   BUN 4.5 (L) 06/23/2016 1127   CREATININE 1.0 06/23/2016 1127      Component Value Date/Time   CALCIUM 9.7 06/23/2016 1127   ALKPHOS 103 06/23/2016 1127   AST 21 06/23/2016 1127   ALT 19 06/23/2016 1127   BILITOT 0.50 06/23/2016 1127       RADIOGRAPHIC STUDIES: No results found.  ASSESSMENT AND PLAN:  This is a very pleasant 66 years old African-American male with a stage IA non-small cell lung cancer status post wedge resection of the right lower lobe in 2014 and has been on  observation since that time. He had an x-ray in April 2018 that showed no evidence for disease recurrence but the patient was supposed to have repeat CT scan of the chest before this visit and it was not scheduled. I will arrange for the patient to have CT scan of the chest in a few days and if no evidence for disease recurrence, I would see him back for follow-up visit in 6 months with repeat CT scan of the chest. He was advised to call immediately if he has any concerning symptoms in the interval. The patient voices understanding of current disease status and treatment options and is in agreement with the current care plan. All questions were answered. The patient knows to call the clinic with any problems, questions or concerns. We can certainly see the patient much sooner if necessary. I spent 10 minutes counseling the patient face to face.  The total time spent in the appointment was 15 minutes.  Disclaimer: This note was dictated with voice recognition software. Similar sounding words can inadvertently be transcribed and may not be corrected upon review.

## 2016-06-28 ENCOUNTER — Encounter (HOSPITAL_COMMUNITY): Payer: Self-pay

## 2016-06-28 ENCOUNTER — Ambulatory Visit (HOSPITAL_COMMUNITY)
Admission: RE | Admit: 2016-06-28 | Discharge: 2016-06-28 | Disposition: A | Discharge status: Home / Self Care | Payer: Medicare Other | Source: Ambulatory Visit | Attending: Internal Medicine | Admitting: Internal Medicine

## 2016-06-28 DIAGNOSIS — C3491 Malignant neoplasm of unspecified part of right bronchus or lung: Secondary | ICD-10-CM | POA: Insufficient documentation

## 2016-06-28 DIAGNOSIS — Z9889 Other specified postprocedural states: Secondary | ICD-10-CM | POA: Diagnosis not present

## 2016-06-28 DIAGNOSIS — C3490 Malignant neoplasm of unspecified part of unspecified bronchus or lung: Secondary | ICD-10-CM | POA: Diagnosis not present

## 2016-06-28 DIAGNOSIS — J439 Emphysema, unspecified: Secondary | ICD-10-CM | POA: Diagnosis not present

## 2016-06-28 DIAGNOSIS — J432 Centrilobular emphysema: Secondary | ICD-10-CM | POA: Diagnosis not present

## 2016-06-28 MED ORDER — IOPAMIDOL (ISOVUE-300) INJECTION 61%
INTRAVENOUS | Status: AC
  Administered 2016-06-28: 75 mL
  Filled 2016-06-28: qty 75

## 2016-06-30 ENCOUNTER — Telehealth: Payer: Self-pay | Admitting: Medical Oncology

## 2016-06-30 NOTE — Telephone Encounter (Signed)
Scan was good. Will see him back in 6 months with repeat CT chest

## 2016-07-10 DIAGNOSIS — M1612 Unilateral primary osteoarthritis, left hip: Secondary | ICD-10-CM | POA: Diagnosis not present

## 2016-07-12 DIAGNOSIS — M1612 Unilateral primary osteoarthritis, left hip: Secondary | ICD-10-CM | POA: Diagnosis not present

## 2016-07-17 DIAGNOSIS — M1612 Unilateral primary osteoarthritis, left hip: Secondary | ICD-10-CM | POA: Diagnosis not present

## 2016-07-19 DIAGNOSIS — M1612 Unilateral primary osteoarthritis, left hip: Secondary | ICD-10-CM | POA: Diagnosis not present

## 2016-07-20 DIAGNOSIS — M7062 Trochanteric bursitis, left hip: Secondary | ICD-10-CM | POA: Diagnosis not present

## 2016-07-20 DIAGNOSIS — M1612 Unilateral primary osteoarthritis, left hip: Secondary | ICD-10-CM | POA: Diagnosis not present

## 2016-07-31 ENCOUNTER — Encounter: Payer: Self-pay | Admitting: Internal Medicine

## 2016-07-31 ENCOUNTER — Ambulatory Visit (INDEPENDENT_AMBULATORY_CARE_PROVIDER_SITE_OTHER): Payer: Medicare Other | Admitting: Internal Medicine

## 2016-07-31 VITALS — BP 134/86 | HR 100 | Ht 69.0 in | Wt 182.0 lb

## 2016-07-31 DIAGNOSIS — J449 Chronic obstructive pulmonary disease, unspecified: Secondary | ICD-10-CM | POA: Diagnosis not present

## 2016-07-31 DIAGNOSIS — J310 Chronic rhinitis: Secondary | ICD-10-CM | POA: Diagnosis not present

## 2016-07-31 DIAGNOSIS — J9611 Chronic respiratory failure with hypoxia: Secondary | ICD-10-CM

## 2016-07-31 MED ORDER — PREDNISONE 10 MG PO TABS: ORAL_TABLET | ORAL | 0 refills | Status: DC

## 2016-07-31 NOTE — Patient Instructions (Addendum)
Prednisone 10 mg take 4 each am x 2 days,   2 each am x 2 days,  1 each am x 2 days and stop   02 should be 2lpm with activity and as needed at rest - we will have you checked also for nocturnal 02 needs   As needed for itching / sneezing / runny nose > xyzal as neeed   If xyzal not effective then stop xyzal >> For drainage / throat tickle/itching/sneezing/  try take CHLORPHENIRAMINE  4 mg - take one every 4 hours as needed - available over the counter- may cause drowsiness so start with just a bedtime dose or two and see how you tolerate it before trying in daytime      Change your appt :  Please schedule a follow up visit in 3 months but call sooner if needed

## 2016-07-31 NOTE — Progress Notes (Signed)
Subjective:    Patient ID: Elijah Marshall, male    DOB: 25-Jan-1951  MRN: [id number]   Brief patient profile:  66  yobm quit smoking 2013  and able work out at [employer] and did fine until hurt back 2011 then 2 back surgeries and knee surgery  and more noticeable doe  since then but also limited by back and referred to pulmonary clinic 09/05/2013 for ? Copd  With GOLD II criteria 10/29/13 and confirmed 01/10/2016     History of Present Illness  09/05/2013 1st [facility] office visit/ Elijah Marshall  Chief Complaint  Patient presents with  . Pulmonary Consult    Referred per Dr. Norberto Marshall.  Pt c/o SOB for the past 3 yrs. He states that he was dxed with COPD back in 2012 or 2013.  He states that that he sometimes has trouble with breathing when walking up stairs and lifting things.    rx spiriva first, alb, dulera not as effective  No problem at rest or supine Assoc nasal congestion much better p shot (?depomedrol) and bad again x sev years corresponds to worse doe  Ex = walking one mile slower pace than wife,trouble with hills, worse in heat  Bad hb even on dexilant  Min dry cough  rec Stop spiriva  Start anoro two puffs off one click each am Zantac 150 mg one at  Bedtime Prednisone 10 mg take  4 each am x 2 days,   2 each am x 2 days,  1 each am x 2 days and stop  GERD diet   10/29/2013 f/u ov/Elijah Marshall re:  GOLD II, no need for saba on anoro daily  Chief Complaint  Patient presents with  . Follow-up    Breathing is some better- has not use rescue inhaler since the last visit. He states that he is coughing up minimal yellow sputum and still c/o sinus pressure.   cough worse x sev weeks with nasal congestion not using singulair consistently at all with neg sinus ct noted.  advil cold and sinus helps some. Not limited by breathing from desired activities   rec  singulair trial daily > x one month no better so d/c'd      01/10/2016  f/u ov/Elijah Marshall re: GOLD II/ symb 160 2bid  Chief Complaint    Patient presents with  . Follow-up    PFT today. Pt states his ears and nose are stuffy. He states his breathing is overall doing well and he is not coughing much.  He is using albuterol inhaler 2 x per wk on average.   MMRC2 = can't walk a nl pace on a flat Marshall s sob but does fine slow and flat eg shopping / can't do step  rec Plan A = Automatic = Symbicort 160 Take 2 puffs first thing in am and then another 2 puffs about 12 hours later.  Work on [employer]:  Plan B = Backup Only use your albuterol (proair) as a rescue medication     05/16/2016  f/u ov/Elijah Marshall re:  GOLD II/ symb 160 2bid / has 02  conc for prn use  Chief Complaint  Patient presents with  . Follow-up    Pt needs o2 recert due to insurance change. He c/o raspy voice and watery eyes- relates to allergies. He also c/o night sweats recently.   maint on xyzal year round  Only using saba maybe once a week Doe still = MMRC2 = can't walk a  nl pace on a flat Marshall s sob but does fine slow and flat  rec Prednisone 10 mg take 4 each am x 2 days,   2 each am x 2 days,  1 each am x 2 days and stop  02 should be 2lpm with activity and as needed at rest - we will have you checked also for nocturnal 02 needs  As needed for itching / sneezing / runny nose > xyzal as neeed  If not effective then stop xyzal >> For drainage / throat tickle try take CHLORPHENIRAMINE  4 mg - take one every 4 hours as needed - available over the counter- may cause drowsiness so start with just a bedtime dose or two and see how you tolerate it before trying in daytime     07/31/2016  f/u ov/Elijah Marshall re:  symb 160 2bid / ppi bid ac / 0 2 2lpm just with ex  Chief Complaint  Patient presents with  . Follow-up    Pt c/o nasal congestion and "feels like water is in my ears"- for the past 4 wks. He uses proair 1 x per wk on average.   doe = MMRC2 = can't walk a nl pace on a flat Marshall s sob but does fine slow and flat eg walking up to a mile on 02    Easily confused between maintenance and prn medicines especially related to his nasal symptoms which have been flaring all spring despite neg allergy panel.  No obvious day to day or daytime variability or assoc excess/ purulent sputum or mucus plugs or hemoptysis or cp or chest tightness, subjective wheeze or overt sinus or hb symptoms. No unusual exp hx or h/o childhood pna/ asthma or knowledge of premature birth.  Sleeping ok without nocturnal  or early am exacerbation  of respiratory  c/o's or need for noct saba. Also denies any obvious fluctuation of symptoms with weather or environmental changes or other aggravating or alleviating factors except as outlined above   Current Medications, Allergies, Complete Past Medical History, Past Surgical History, Family History, and Social History were reviewed in [employer] record.  ROS  The following are not active complaints unless bolded sore throat, dysphagia, dental problems, itching, sneezing,  nasal congestion or excess/ purulent secretions, ear ache,   fever, chills, sweats, unintended wt loss, classically pleuritic or exertional cp,  orthopnea pnd or leg swelling, presyncope, palpitations, abdominal pain, anorexia, nausea, vomiting, diarrhea  or change in bowel or bladder habits, change in stools or urine, dysuria,hematuria,  rash, arthralgias, visual complaints, headache, numbness, weakness or ataxia or problems with walking or coordination,  change in mood/affect or memory.             Objective:   Physical Exam  amb bm nad   07/31/2016        182  05/16/2016        181  04/28/2016        185  01/10/2016        179    11/19/15 182 lb (82.6 kg)  11/11/15 180 lb (81.6 kg)  11/04/15 181 lb 3.5 oz (82.2 kg)    Vital signs reviewed  - Note on arrival 02 sats  99% on RA     HEENT: nl dentition and oropharynx. Nl external ear canals without cough reflex  - mild  bilateral non-specific turbinate edema     NECK :   without JVD/Nodes/TM/ nl carotid upstrokes bilaterally   LUNGS: no  acc muscle use,  Nl contour chest with slt distant bs/ no wheeze at all    CV:  RRR  no s3 or murmur or increase in P2, and no edema   ABD:  soft and nontender with nl inspiratory excursion in the supine position. No bruits or organomegaly appreciated, bowel sounds nl  MS:  Nl gait/ ext warm without deformities, calf tenderness, cyanosis or clubbing No obvious joint restrictions   SKIN: warm and dry without lesions    NEURO:  alert, approp, nl sensorium with  no motor or cerebellar deficits apparent.

## 2016-08-02 NOTE — Assessment & Plan Note (Signed)
Sinus ct 10/22/13  : Normal paranasal sinuses. - rechallenge with singulair 10/29/2013 > d/c 01/28/2014 as not better  - repeat sinus CT 04/08/15 > neg  - Allergy profile 05/16/2016 >  Eos 0. 1/  IgE  14 neg RAST   Rec:  Reviewed prn antihistamine options. Prednisone 10 mg take  4 each am x 2 days,   2 each am x 2 days,  1 each am x 2 days and stop   see avs for instructions unique to this ov

## 2016-08-02 NOTE — Assessment & Plan Note (Signed)
-   Spirometry  03/01/12  FEV1  1.63 - Spirometry   09/05/13 FEV1  1.34 (46%) ratio 44  - PFTs   10/29/2013    FEV1    1.70 (62%) ratio 50% no better after ssaba and DLCO 48%  - 09/05/2013  Walked RA x 3 laps @ fast pace @  185 ft each stopped due to end of study, fast pace,sat 89% at end  - 01/06/14 gradual worsening on anoro plus increase need for saba  - 01/28/2014   > try spiriva respimat/ symbicort 160 2bid > changed to symbicort 160 2bid only on 01/28/14  - 02/25/2014   90%   > changed to symbicort 80 2bid since hoarse on 160 and clear on exam  - 11/19/2015 rechallenge with symbicort 160 Take 2 puffs first thing in am and then another 2 puffs about 12 hours later.  - PFT's  01/10/2016  FEV1 1.38 (51 % ) ratio 47  p 1 % improvement from saba p symb 160  prior to study with DLCO  43/41  % corrects to 47  % for alv volume    -  07/31/2016   After extensive coaching HFA effectiveness =    75%  (ti short)   He continues to have COPD with an asthmatic component that may be partially driven by rhinitis. I recommended a short course of prednisone for this symptom and continued working on optimal HFA technique.  I had an extended discussion with the patient reviewing all relevant studies completed to date and  lasting 15 to 20 minutes of a 25 minute visit    Each maintenance medication was reviewed in detail including most importantly the difference between maintenance and prns and under what circumstances the prns are to be triggered using an action plan format that is not reflected in the computer generated alphabetically organized AVS.    Please see AVS for specific instructions unique to this visit that I personally wrote and verbalized to the the pt in detail and then reviewed with pt  by my nurse highlighting any  changes in therapy recommended at today's visit to their plan of care.

## 2016-08-02 NOTE — Assessment & Plan Note (Signed)
SATURATION QUALIFICATIONS:  11/19/2015  Patient Saturations on Room Air at Rest = 96% Patient Saturations on Room Air while Ambulating = 86% Patient Saturations on 2  Liters of oxygen while Ambulating = 96% - 05/16/2016 :  Patient Saturations on Room Air at Rest = 88%--- increased to 99% on 2lpm continuous o2//lmr  He is using oxygen appropriately at bedtime and then prn daytime at 2 L/m. No changes needed.

## 2016-08-15 ENCOUNTER — Ambulatory Visit: Payer: Medicare Other | Admitting: Internal Medicine

## 2016-09-14 DIAGNOSIS — M1612 Unilateral primary osteoarthritis, left hip: Secondary | ICD-10-CM | POA: Diagnosis not present

## 2016-09-14 DIAGNOSIS — M7062 Trochanteric bursitis, left hip: Secondary | ICD-10-CM | POA: Diagnosis not present

## 2016-10-30 ENCOUNTER — Ambulatory Visit: Payer: Commercial Managed Care - HMO | Admitting: Family Medicine

## 2016-10-31 ENCOUNTER — Ambulatory Visit: Payer: Medicare Other | Admitting: Internal Medicine

## 2016-11-19 ENCOUNTER — Other Ambulatory Visit: Payer: Self-pay | Admitting: Family Medicine

## 2016-11-21 ENCOUNTER — Ambulatory Visit (INDEPENDENT_AMBULATORY_CARE_PROVIDER_SITE_OTHER): Payer: Medicare Other | Admitting: Internal Medicine

## 2016-11-21 ENCOUNTER — Encounter: Payer: Self-pay | Admitting: Internal Medicine

## 2016-11-21 VITALS — BP 152/90 | HR 88 | Ht 69.0 in | Wt 183.0 lb

## 2016-11-21 DIAGNOSIS — Z23 Encounter for immunization: Secondary | ICD-10-CM

## 2016-11-21 DIAGNOSIS — J449 Chronic obstructive pulmonary disease, unspecified: Secondary | ICD-10-CM | POA: Diagnosis not present

## 2016-11-21 DIAGNOSIS — J310 Chronic rhinitis: Secondary | ICD-10-CM

## 2016-11-21 DIAGNOSIS — J9611 Chronic respiratory failure with hypoxia: Secondary | ICD-10-CM

## 2016-11-21 MED ORDER — PREDNISONE 10 MG PO TABS: ORAL_TABLET | ORAL | 0 refills | Status: DC

## 2016-11-21 NOTE — Progress Notes (Signed)
Subjective:    Patient ID: Elijah Marshall, male    DOB: 05-11-1950  MRN: [id number]   Brief patient profile:  63  yobm quit smoking 2013  and able work out at [employer] and did fine until hurt back 2011 then 2 back surgeries and knee surgery  and more noticeable doe  since then but also limited by back and s/p wedge resection of [facility] [place] lung ca Ia 05/2015  referred to pulmonary clinic 09/05/2013 for ? Copd  With GOLD II criteria 10/29/13 and confirmed 01/10/2016     History of Present Illness  09/05/2013 1st [place] [facility] office visit/ W[facility]  Chief Complaint  Patient presents with  . Pulmonary Consult    Referred per Dr. [name].  Pt c/o SOB for the past 3 yrs. He states that he was dxed with COPD back in 2012 or 2013.  He states that that he sometimes has trouble with breathing when walking up stairs and lifting things.    rx spiriva first, alb, dulera not as effective  No problem at rest or supine Assoc nasal congestion much better p shot (?depomedrol) and bad again x sev years corresponds to worse doe  Ex = walking one mile slower pace than wife,trouble with hills, worse in heat  Bad hb even on dexilant  Min dry cough  rec Stop spiriva  Start anoro two puffs off one click each am Zantac 762 mg one at  Bedtime Prednisone 10 mg take  4 each am x 2 days,   2 each am x 2 days,  1 each am x 2 days and stop  GERD diet        01/10/2016  f/u ov/Wert re: GOLD II/ symb 160 2bid  Chief Complaint  Patient presents with  . Follow-up    PFT today. Pt states his ears and nose are stuffy. He states his breathing is overall doing well and he is not coughing much.  He is using albuterol inhaler 2 x per wk on average.   MMRC2 = can't walk a nl pace on a flat grade s sob but does fine slow and flat eg shopping / can't do step  rec Plan A = Automatic = Symbicort 160 Take 2 puffs first thing in am and then another 2 puffs about 12 hours later.  Work on G[employer]er:  Plan B =  Backup Only use your albuterol (proair) as a rescue medication     05/16/2016  f/u ov/Wert re:  GOLD II/ symb 160 2bid / has 02  conc for prn use  Chief Complaint  Patient presents with  . Follow-up    Pt needs o2 recert due to insurance change. He c/o raspy voice and watery eyes- relates to allergies. He also c/o night sweats recently.   maint on xyzal year round  Only using saba maybe once a week Doe still = MMRC2 = can't walk a nl pace on a flat grade s sob but does fine slow and flat  rec Prednisone 10 mg take 4 each am x 2 days,   2 each am x 2 days,  1 each am x 2 days and stop  02 should be 2lpm with activity and as needed at rest - we will have you checked also for nocturnal 02 needs  As needed for itching / sneezing / runny nose > xyzal as neeed  If not effective then stop xyzal >> For drainage / throat tickle try take  CHLORPHENIRAMINE  4 mg - take one every 4 hours as needed - available over the counter- may cause drowsiness so start with just a bedtime dose or two and see how you tolerate it before trying in daytime           11/21/2016  f/u ov/Wert re:  GOLD II/ symb 160 2bid/ 2lpm exertion only  Chief Complaint  Patient presents with  . Follow-up    Increased cough and sneezing x 3-4 days- hard to produce sputum, but it's yellow when he does. He is using albuterol inhaler 2-3 x per wk on average.    poor insight into saba use / never tried 1st gen H1 blockers per guidelines  For rhinitis   Baseline no change doe = mmrc2 then gradually worse x 3 days assoc with watery rhinitis/ sneezing but did not increase saba as rec assoc with congested cough slt discolored mucus but nothing frankly purulent    No obvious day to day or daytime variability or assoc  mucus plugs or hemoptysis or cp or chest tightness, subjective wheeze or overt sinus or hb symptoms. No unusual exp hx or h/o childhood pna/ asthma or knowledge of premature birth.  Sleeping ok flat without nocturnal  or  early am exacerbation  of respiratory  c/o's or need for noct saba. Also denies any obvious fluctuation of symptoms with weather or environmental changes or other aggravating or alleviating factors except as outlined above   Current Allergies, Complete Past Medical History, Past Surgical History, Family History, and Social History were reviewed in [employer] record.  ROS  The following are not active complaints unless bolded Hoarseness, sore throat, dysphagia, dental problems, itching, sneezing,  nasal congestion or discharge of excess mucus or purulent secretions, ear ache,   fever, chills, sweats, unintended wt loss or wt gain, classically pleuritic or exertional cp,  orthopnea pnd or leg swelling, presyncope, palpitations, abdominal pain, anorexia, nausea, vomiting, diarrhea  or change in bowel habits or change in bladder habits, change in stools or change in urine, dysuria, hematuria,  rash, arthralgias, visual complaints, headache, numbness, weakness or ataxia or problems with walking or coordination,  change in mood/affect or memory.        Current Meds  Medication Sig  . albuterol (PROAIR HFA) 108 (90 Base) MCG/ACT inhaler 2 puffs every 4 hours as needed only  if your can't catch your breath  . budesonide-formoterol (SYMBICORT) 160-4.5 MCG/ACT inhaler Take 2 puffs first thing in am and then another 2 puffs about 12 hours later.  . clonazePAM (KLONOPIN) 1 MG tablet Take 1/2 tablet as needed twice a day for anxiety and 1-2 tablets at night as needed for sleep  . Guaifenesin (MUCINEX MAXIMUM STRENGTH) 1200 MG TB12 Take 1 tablet (1,200 mg total) by mouth every 12 (twelve) hours as needed.  . lansoprazole (PREVACID) 30 MG capsule Take 1 capsule (30 mg total) by mouth 2 (two) times daily before a meal.  . levocetirizine (XYZAL) 5 MG tablet Take 1 tablet (5 mg total) by mouth every evening.  . metoprolol tartrate (LOPRESSOR) 25 MG tablet TAKE 1/2 A TABLET BY MOUTH TWICE A DAY    . OXYGEN 2lpm with exertion only  . polyethylene glycol (MIRALAX) packet Take 17 g by mouth daily.  . pravastatin (PRAVACHOL) 40 MG tablet Take 1 tablet (40 mg total) by mouth at bedtime.  . ranitidine (ZANTAC) 150 MG capsule Take 150 mg by mouth every evening. As needed  . senna (  SENOKOT) 8.6 MG TABS tablet Take 2 tablets (17.2 mg total) by mouth at bedtime.              Objective:   Physical Exam  amb bm nad   07/31/2016        182  05/16/2016        181  04/28/2016        185  01/10/2016        179    11/19/15 182 lb (82.6 kg)  11/11/15 180 lb (81.6 kg)  11/04/15 181 lb 3.5 oz (82.2 kg)    Vital signs reviewed  - Note on arrival 02 sats  97% on RA     HEENT: nl dentition and oropharynx. Nl external ear canals without cough reflex  - mild /moderate  bilateral non-specific turbinate edema  s purulent secretions   NECK :  without JVD/Nodes/TM/ nl carotid upstrokes bilaterally   LUNGS: no acc muscle use,  Nl contour chest with slt distant bs with faint late exp wheezes bilaterally   CV:  RRR  no s3 or murmur or increase in P2, and no edema   ABD:  soft and nontender with nl inspiratory excursion in the supine position. No bruits or organomegaly appreciated, bowel sounds nl  MS:  Nl gait/ ext warm without deformities, calf tenderness, cyanosis or clubbing No obvious joint restrictions   SKIN: warm and dry without lesions    NEURO:  alert, approp, nl sensorium with  no motor or cerebellar deficits apparent.

## 2016-11-21 NOTE — Assessment & Plan Note (Addendum)
-   Spirometry  03/01/12  FEV1  1.63 - Spirometry   09/05/13 FEV1  1.34 (46%) ratio 44  - PFTs   10/29/2013    FEV1    1.70 (62%) ratio 50% no better after ssaba and DLCO 48%  - 09/05/2013  Walked RA x 3 laps @ fast pace @  185 ft each stopped due to end of study, fast pace,sat 89% at end  - 01/06/14 gradual worsening on anoro plus increase need for saba  - 01/28/2014   > try spiriva respimat/ symbicort 160 2bid > changed to symbicort 160 2bid only on 01/28/14  - 02/25/2014   90%   > changed to symbicort 80 2bid since hoarse on 160 and clear on exam  - 11/19/2015 rechallenge with symbicort 160 Take 2 puffs first thing in am and then another 2 puffs about 12 hours later.  - PFT's  01/10/2016  FEV1 1.38 (51 % ) ratio 47  p 1 % improvement from saba p symb 160  prior to study with DLCO  43/41  % corrects to 47  % for alv volume    - 11/21/2016  After extensive coaching HFA effectiveness =   90%    Mild flare assoc with watery rhinitis > rec Prednisone 10 mg take  4 each am x 2 days,   2 each am x 2 days,  1 each am x 2 days and stop   I had an extended discussion with the patient reviewing all relevant studies completed to date and  lasting 15 to 20 minutes of a 25 minute visit    Each maintenance medication was reviewed in detail including most importantly the difference between maintenance and prns and under what circumstances the prns are to be triggered using an action plan format that is not reflected in the computer generated alphabetically organized AVS.    Please see AVS for specific instructions unique to this visit that I personally wrote and verbalized to the the pt in detail and then reviewed with pt  by my nurse highlighting any  changes in therapy recommended at today's visit to their plan of care.

## 2016-11-21 NOTE — Patient Instructions (Addendum)
Plan A = Automatic = symbicort 160 Take 2 puffs first thing in am and then another 2 puffs about 12 hours later.   Work on maintaining perfect  inhaler technique:  relax and gently blow all the way out then take a nice smooth deep breath back in, triggering the inhaler at same time you start breathing in.  Hold for up to 5 seconds if you can. Blow out thru nose. Rinse and gargle with water when done  Prednisone 10 mg take  4 each am x 2 days,   2 each am x 2 days,  1 each am x 2 days and stop       Plan B = Backup Only use your albuterol(ventolin)  as a rescue medication to be used if you can't catch your breath by resting or doing a relaxed purse lip breathing pattern.  - The less you use it, the better it will work when you need it. - Ok to use the inhaler up to 2 puffs  every 4 hours if you must but call for appointment if use goes up over your usual need - Don't leave home without it !!  (think of it like the spare tire for your car)    Please schedule a follow up visit in 3 months but call sooner if needed

## 2016-11-21 NOTE — Assessment & Plan Note (Signed)
Sinus ct 10/22/13  : Normal paranasal sinuses. - rechallenge with singulair 10/29/2013 > d/c 01/28/2014 as not better  - repeat sinus CT 04/08/15 > neg  - Allergy profile 05/16/2016 >  Eos 0. 1/  IgE  14 neg RAST   Intranasal steroids and intranasal antihistamines are effective for symptoms associated with non-allergic rhinitis, whereas second generation antihistamines such as cetirizine, loratadine and fexofenadine have been found to be ineffective for this condition so rec 1st gen H1 blockers per guidelines

## 2016-11-21 NOTE — Assessment & Plan Note (Signed)
SATURATION QUALIFICATIONS:  11/19/2015  Patient Saturations on Room Air at Rest = 96% Patient Saturations on Room Air while Ambulating = 86% Patient Saturations on 2  Liters of oxygen while Ambulating = 96% - 05/16/2016 :  Patient Saturations on Room Air at Rest = 88%--- increased to 99% on 2lpm continuous o2//lmr  sats ok on RA at rest even during flare so rec 2lpm just with exertion / reviewed with pt

## 2016-11-27 ENCOUNTER — Other Ambulatory Visit: Payer: Self-pay | Admitting: Family Medicine

## 2016-12-12 ENCOUNTER — Telehealth: Payer: Self-pay | Admitting: Internal Medicine

## 2016-12-12 NOTE — Telephone Encounter (Signed)
Spoke with pt, who states he is having trouble falling and staying asleep.  Pt is requesting an sleep aid.  I have suggested that pt contact PCP in regards to this matter.  Pt voiced his understanding and had no further questions. Nothing further needed.

## 2016-12-16 ENCOUNTER — Other Ambulatory Visit: Payer: Self-pay | Admitting: Internal Medicine

## 2016-12-16 DIAGNOSIS — J449 Chronic obstructive pulmonary disease, unspecified: Secondary | ICD-10-CM

## 2016-12-25 ENCOUNTER — Other Ambulatory Visit (HOSPITAL_BASED_OUTPATIENT_CLINIC_OR_DEPARTMENT_OTHER): Payer: Medicare Other

## 2016-12-25 ENCOUNTER — Ambulatory Visit (HOSPITAL_COMMUNITY)
Admission: RE | Admit: 2016-12-25 | Discharge: 2016-12-25 | Disposition: A | Discharge status: Home / Self Care | Payer: Medicare Other | Source: Ambulatory Visit | Attending: Internal Medicine | Admitting: Internal Medicine

## 2016-12-25 DIAGNOSIS — I700 Atherosclerosis of aorta: Secondary | ICD-10-CM | POA: Insufficient documentation

## 2016-12-25 DIAGNOSIS — J439 Emphysema, unspecified: Secondary | ICD-10-CM | POA: Diagnosis not present

## 2016-12-25 DIAGNOSIS — C3491 Malignant neoplasm of unspecified part of right bronchus or lung: Secondary | ICD-10-CM | POA: Insufficient documentation

## 2016-12-25 DIAGNOSIS — Z85118 Personal history of other malignant neoplasm of bronchus and lung: Secondary | ICD-10-CM

## 2016-12-25 DIAGNOSIS — C3490 Malignant neoplasm of unspecified part of unspecified bronchus or lung: Secondary | ICD-10-CM | POA: Diagnosis not present

## 2016-12-25 LAB — COMPREHENSIVE METABOLIC PANEL
ALT: 15 U/L (ref 0–55)
AST: 19 U/L (ref 5–34)
Albumin: 4 g/dL (ref 3.5–5.0)
Alkaline Phosphatase: 107 U/L (ref 40–150)
Anion Gap: 9 mEq/L (ref 3–11)
BUN: 6.1 mg/dL — ABNORMAL LOW (ref 7.0–26.0)
CO2: 28 mEq/L (ref 22–29)
Calcium: 9.6 mg/dL (ref 8.4–10.4)
Chloride: 104 mEq/L (ref 98–109)
Creatinine: 1.1 mg/dL (ref 0.7–1.3)
EGFR: >60 mL/min/{1.73_m2} (ref >60)
Glucose: 81 mg/dl (ref 70–140)
Potassium: 3.6 mEq/L (ref 3.5–5.1)
Sodium: 141 mEq/L (ref 136–145)
Total Bilirubin: 0.56 mg/dL (ref 0.20–1.20)
Total Protein: 7.3 g/dL (ref 6.4–8.3)

## 2016-12-25 LAB — CBC WITH DIFFERENTIAL/PLATELET
BASO%: 0.2 % (ref 0.0–2.0)
Basophils Absolute: 0 10*3/uL (ref 0.0–0.1)
EOS%: 1.8 % (ref 0.0–7.0)
Eosinophils Absolute: 0.1 10*3/uL (ref 0.0–0.5)
HCT: 48.6 % (ref 38.4–49.9)
HGB: 16.2 g/dL (ref 13.0–17.1)
LYMPH%: 31.7 % (ref 14.0–49.0)
MCH: 29.4 pg (ref 27.2–33.4)
MCHC: 33.3 g/dL (ref 32.0–36.0)
MCV: 88.2 fL (ref 79.3–98.0)
MONO#: 0.7 10*3/uL (ref 0.1–0.9)
MONO%: 10.3 % (ref 0.0–14.0)
NEUT#: 3.6 10*3/uL (ref 1.5–6.5)
NEUT%: 56 % (ref 39.0–75.0)
Platelets: 224 10*3/uL (ref 140–400)
RBC: 5.51 10*6/uL (ref 4.20–5.82)
RDW: 13.6 % (ref 11.0–14.6)
WBC: 6.5 10*3/uL (ref 4.0–10.3)
lymph#: 2.1 10*3/uL (ref 0.9–3.3)

## 2016-12-25 MED ORDER — IOPAMIDOL (ISOVUE-300) INJECTION 61%
INTRAVENOUS | Status: AC
  Administered 2016-12-25: 75 mL
  Filled 2016-12-25: qty 75

## 2016-12-27 ENCOUNTER — Telehealth: Payer: Self-pay | Admitting: Internal Medicine

## 2016-12-27 ENCOUNTER — Encounter: Payer: Self-pay | Admitting: Internal Medicine

## 2016-12-27 ENCOUNTER — Ambulatory Visit (HOSPITAL_BASED_OUTPATIENT_CLINIC_OR_DEPARTMENT_OTHER): Payer: Medicare Other | Admitting: Internal Medicine

## 2016-12-27 DIAGNOSIS — Z85118 Personal history of other malignant neoplasm of bronchus and lung: Secondary | ICD-10-CM

## 2016-12-27 DIAGNOSIS — C3490 Malignant neoplasm of unspecified part of unspecified bronchus or lung: Secondary | ICD-10-CM

## 2016-12-27 NOTE — Progress Notes (Signed)
[name]   [phone]  OFFICE PROGRESS NOTE  [name], MD [address] [name] [id number]  DIAGNOSIS: Stage IA (T1a, N0, M0) non-small cell lung cancer, moderately differentiated squamous cell carcinoma presented with right lower lobe lung nodule.   PRIOR THERAPY: Status post wedge resection of the right lower lobe in April 2014 under the care of Dr. Roxan Hockey.  CURRENT THERAPY: Observation.  INTERVAL HISTORY: Elijah Marshall 66 y.o. male returns to the clinic today for follow-up visit.  The patient is feeling fine today with no specific complaints except for mild fatigue.  The patient denied having any significant chest pain, shortness of breath, cough or hemoptysis.  He denied having any fever or chills.  He has no nausea, vomiting, diarrhea or constipation.  He has recent chest congestion from cold symptoms.  The patient had a repeat CT scan of the chest performed recently and is here for evaluation and discussion of his discuss results.   MEDICAL HISTORY: Past Medical History:  Diagnosis Date  . Allergy   . Arthritis   . Cancer [facility])    unsure at this time  . Cancer of lower lobe of right lung ([place])   . COPD (chronic obstructive pulmonary disease) ([place])    per patient's health survey - he put a ?  . Depression   . GERD (gastroesophageal reflux disease)   . Hypertension   . Non-small cell carcinoma of lung, right ([place]) 06/24/2015  . Shortness of breath   . Ulcer     ALLERGIES:  is allergic to aspirin and morphine and related.  MEDICATIONS:  Current Outpatient Medications  Medication Sig Dispense Refill  . albuterol (PROAIR HFA) 108 (90 Base) MCG/ACT inhaler 2 puffs every 4 hours as needed only  if your can't catch your breath 1 Inhaler 2  . budesonide-formoterol (SYMBICORT) 160-4.5 MCG/ACT inhaler Take 2 puffs first thing in am and then another 2 puffs about 12 hours later. 3 Inhaler 3  .  budesonide-formoterol (SYMBICORT) 160-4.5 MCG/ACT inhaler INHALE 2 PUFFS INTO THE LUNGS IN THE MORNING AND THEN 2 PUFFS 12 HOURS LATER 10.2 Inhaler 11  . clonazePAM (KLONOPIN) 1 MG tablet Take 1/2 tablet as needed twice a day for anxiety and 1-2 tablets at night as needed for sleep 60 tablet 0  . Guaifenesin (MUCINEX MAXIMUM STRENGTH) 1200 MG TB12 Take 1 tablet (1,200 mg total) by mouth every 12 (twelve) hours as needed. 14 tablet 1  . lansoprazole (PREVACID) 30 MG capsule Take 1 capsule (30 mg total) by mouth 2 (two) times daily before a meal. 180 capsule 3  . levocetirizine (XYZAL) 5 MG tablet Take 1 tablet (5 mg total) by mouth every evening. 90 tablet 3  . metoprolol tartrate (LOPRESSOR) 25 MG tablet TAKE 1/2 A TABLET BY MOUTH TWICE A DAY 10 tablet 0  . OXYGEN 2lpm with exertion only    . polyethylene glycol (MIRALAX) packet Take 17 g by mouth daily.    . pravastatin (PRAVACHOL) 40 MG tablet Take 1 tablet (40 mg total) by mouth at bedtime. 90 tablet 3  . predniSONE (DELTASONE) 10 MG tablet Take  4 each am x 2 days,   2 each am x 2 days,  1 each am x 2 days and stop 14 tablet 0  . ranitidine (ZANTAC) 150 MG capsule Take 150 mg by mouth every evening. As needed    . senna (SENOKOT) 8.6 MG TABS tablet Take  2 tablets (17.2 mg total) by mouth at bedtime. 120 each 0  . traMADol (ULTRAM) 50 MG tablet Take 50 mg by mouth every 8 (eight) hours as needed. for pain  0   No current facility-administered medications for this visit.     SURGICAL HISTORY:  Past Surgical History:  Procedure Laterality Date  . ANTERIOR FUSION LUMBAR SPINE  07/25/2012  . ANTERIOR LAT LUMBAR FUSION Left 07/25/2012   Procedure: ANTERIOR LATERAL LUMBAR FUSION 1 LEVEL;  Surgeon: [name], MD;  Location: [facility];  Service: Orthopedics;  Laterality: Left;  Left sided lumbar 3-4 lateral interbody fusion with allograft and instrumentation  . COLONOSCOPY  14   polyps rem  . JOINT REPLACEMENT Right 2010  . NODE DISSECTION  Right 05/24/2015   Procedure: NODE DISSECTION;  Surgeon: [name], MD;  Location: [facility];  Service: Thoracic;  Laterality: Right;  . SEGMENTECOMY Right 05/24/2015   Procedure: RIGHT LOWER LOBE SUPERIOR SEGMENTECTOMY;  Surgeon: [name], MD;  Location: [facility];  Service: Thoracic;  Laterality: Right;  . [place]  2012  . VIDEO ASSISTED THORACOSCOPY (VATS)/WEDGE RESECTION Right 05/24/2015   Procedure: RIGHT VIDEO ASSISTED THORACOSCOPY (VATS)/ RIGHTR LOWER LOBE WEDGE RESECTION;  Surgeon: [name], MD;  Location: [facility];  Service: Thoracic;  Laterality: Right;    REVIEW OF SYSTEMS:  A comprehensive review of systems was negative except for: Constitutional: positive for fatigue   PHYSICAL EXAMINATION: General appearance: alert, cooperative and no distress Head: Normocephalic, without obvious abnormality, atraumatic Neck: no adenopathy, no JVD, supple, symmetrical, trachea midline and thyroid not enlarged, symmetric, no tenderness/mass/nodules Lymph nodes: Cervical, supraclavicular, and axillary nodes normal. Resp: clear to auscultation bilaterally Back: symmetric, no curvature. ROM normal. No CVA tenderness. Cardio: regular rate and rhythm, S1, S2 normal, no murmur, click, rub or gallop GI: soft, non-tender; bowel sounds normal; no masses,  no organomegaly Extremities: extremities normal, atraumatic, no cyanosis or edema  ECOG PERFORMANCE STATUS: 1 - Symptomatic but completely ambulatory  Blood pressure (!) 145/109, pulse 91, temperature 98.4 F (36.9 C), temperature source Oral, resp. rate 18, height 5\' 9"  (1.753 m), weight 183 lb 1.6 oz (83.1 kg), SpO2 93 %.  LABORATORY DATA: Lab Results  Component Value Date   WBC 6.5 12/25/2016   HGB 16.2 12/25/2016   HCT 48.6 12/25/2016   MCV 88.2 12/25/2016   PLT 224 12/25/2016      Chemistry      Component Value Date/Time   NA 141 12/25/2016 1010   K 3.6 12/25/2016 1010   CL 102 08/26/2015 1139   CO2 28  12/25/2016 1010   BUN 6.1 (L) 12/25/2016 1010   CREATININE 1.1 12/25/2016 1010      Component Value Date/Time   CALCIUM 9.6 12/25/2016 1010   ALKPHOS 107 12/25/2016 1010   AST 19 12/25/2016 1010   ALT 15 12/25/2016 1010   BILITOT 0.56 12/25/2016 1010       RADIOGRAPHIC STUDIES: Ct Chest W Contrast  Result Date: 12/25/2016 CLINICAL DATA:  Lung cancer. EXAM: CT CHEST WITH CONTRAST TECHNIQUE: Multidetector CT imaging of the chest was performed during intravenous contrast administration. CONTRAST:  78mL ISOVUE-300 IOPAMIDOL (ISOVUE-300) INJECTION 61% COMPARISON:  06/28/2016. FINDINGS: Cardiovascular: Atherosclerotic calcification of the arterial vasculature, including coronary arteries. Heart size normal. No pericardial effusion. Mediastinum/Nodes: Mediastinal lymph nodes are not enlarged by CT size criteria. No hilar or axillary adenopathy. Esophagus is grossly unremarkable. Lungs/Pleura: Moderate emphysema with scattered bullous changes. Postoperative and post treatment changes  in the lower right hemithorax, stable. Lungs are otherwise clear. Probable adherent debris in the trachea. Airway is otherwise unremarkable. Upper Abdomen: Visualized portions of the liver, gallbladder, adrenal glands, kidneys, spleen, pancreas, stomach and bowel are grossly unremarkable. No upper abdominal adenopathy. Musculoskeletal: Degenerative changes in the spine. No worrisome lytic or sclerotic lesions. IMPRESSION: 1. Postsurgical and post treatment changes in the right hemithorax without evidence of recurrent or metastatic disease. 2.  Aortic atherosclerosis (ICD10-170.0). 3.  Emphysema (ICD10-J43.9). Electronically Signed   By: Lorin Picket M.D.   On: 12/25/2016 13:50    ASSESSMENT AND PLAN:  This is a very pleasant 66 years old African-American male with a stage IA non-small cell lung cancer status post wedge resection of the right lower lobe in 2014. The patient is current on observation.  He is feeling fine  with no specific complaints. Repeat CT scan of the chest showed no evidence for disease recurrence or metastasis. I discussed the scan results with the patient today and recommended for him to continue on observation with a repeat CT scan of the chest in 1 year. He was advised to call immediately if he has any concerning symptoms in the interval. The patient voices understanding of current disease status and treatment options and is in agreement with the current care plan. All questions were answered. The patient knows to call the clinic with any problems, questions or concerns. We can certainly see the patient much sooner if necessary. I spent 10 minutes counseling the patient face to face. The total time spent in the appointment was 15 minutes.  Disclaimer: This note was dictated with voice recognition software. Similar sounding words can inadvertently be transcribed and may not be corrected upon review.

## 2016-12-27 NOTE — Telephone Encounter (Signed)
Scheduled appt per 11/21 los - Gave patient aVS and calender per los. Central radiology to contact patient with ct schedule.

## 2017-01-01 ENCOUNTER — Telehealth: Payer: Self-pay | Admitting: Internal Medicine

## 2017-01-01 NOTE — Telephone Encounter (Signed)
Spoke with pt, advised samples up front to pick up. Nothing further is needed.

## 2017-01-31 ENCOUNTER — Other Ambulatory Visit: Payer: Self-pay | Admitting: Family Medicine

## 2017-01-31 NOTE — Telephone Encounter (Signed)
Did not approve refill on Prevacid.   Last OV 11/11/15.     Last refilled on 01/12/16.

## 2017-02-15 ENCOUNTER — Telehealth: Payer: Self-pay

## 2017-02-15 ENCOUNTER — Telehealth: Payer: Self-pay | Admitting: Family Medicine

## 2017-02-15 NOTE — Telephone Encounter (Signed)
Copied from [place] [phone]. Topic: Quick Communication - Rx Refill/Question >> Feb 15, 2017 11:37 AM Malena Catholic I, NT wrote: Medication:Klonopin 1 mg Metoprolol  25 mg  Has the patient contacted their pharmacy yes   (Agent: If no, request that the patient contact the pharmacy for the refill.yes   Preferred Pharmacy (with phone number or street name [facility] [phone]  Agent: Please be advised that RX refills may take up to 3 business days. We ask that you follow-up with your pharmacy.

## 2017-02-15 NOTE — Telephone Encounter (Signed)
Copied from [place] [phone]. Topic: Quick Communication - Rx Refill/Question >> Feb 15, 2017 11:43 AM Malena Catholic I, NT wrote: Medication: Metoprolol 25 Mg Klonopin 1 mg    Has the patient contacted their pharmacy ye   (Agent: If no, request that the patient contact the pharmacy for the refill. Yes    Preferred Pharmacy (with phone number or street name [facility] [place] [phone]   Agent: Please be advised that RX refills may take up to 3 business days. We ask that you follow-up with your pharmacy.

## 2017-02-15 NOTE — Telephone Encounter (Signed)
Pt needs office visit for both meds. Please schedule.   Copied from [place] [phone]. Topic: Quick Communication - Rx Refill/Question >> Feb 15, 2017 11:43 AM Malena Catholic I, NT wrote: Medication: Metoprolol 25 Mg Klonopin 1 mg    Has the patient contacted their pharmacy ye   (Agent: If no, request that the patient contact the pharmacy for the refill. Yes    Preferred Pharmacy (with phone number or street name [facility] [place] [phone]   Agent: Please be advised that RX refills may take up to 3 business days. We ask that you follow-up with your pharmacy.

## 2017-02-16 NOTE — Telephone Encounter (Signed)
Pt has appt 02/19/2017

## 2017-02-16 NOTE — Telephone Encounter (Signed)
Called pt to make an appt with [facility] for med refills. He is out of his  Metoprolol 25 Mg completely. I was only able to make him an appt "same day" as [facility] does not have anything for a at least a week for regular OV.   Patient is worried about being able to last until Monday at noon being out of his metoprolol.  Please advise pt by calling best number [phone]

## 2017-02-19 ENCOUNTER — Encounter: Payer: Self-pay | Admitting: Family Medicine

## 2017-02-19 ENCOUNTER — Other Ambulatory Visit: Payer: Self-pay

## 2017-02-19 ENCOUNTER — Ambulatory Visit (INDEPENDENT_AMBULATORY_CARE_PROVIDER_SITE_OTHER): Payer: Medicare Other | Admitting: Family Medicine

## 2017-02-19 DIAGNOSIS — M25512 Pain in left shoulder: Secondary | ICD-10-CM | POA: Diagnosis not present

## 2017-02-19 DIAGNOSIS — M25511 Pain in right shoulder: Secondary | ICD-10-CM | POA: Diagnosis not present

## 2017-02-19 DIAGNOSIS — G8929 Other chronic pain: Secondary | ICD-10-CM | POA: Diagnosis not present

## 2017-02-19 DIAGNOSIS — I10 Essential (primary) hypertension: Secondary | ICD-10-CM

## 2017-02-19 DIAGNOSIS — F5101 Primary insomnia: Secondary | ICD-10-CM | POA: Diagnosis not present

## 2017-02-19 MED ORDER — METOPROLOL TARTRATE 25 MG PO TABS: ORAL_TABLET | ORAL | 0 refills | Status: DC

## 2017-02-19 MED ORDER — NEBIVOLOL HCL 5 MG PO TABS: 5.0000 mg | ORAL_TABLET | Freq: Every day | ORAL | 2 refills | Status: DC

## 2017-02-19 MED ORDER — CLONAZEPAM 1 MG PO TABS: ORAL_TABLET | ORAL | 1 refills | Status: DC

## 2017-02-19 MED ORDER — TRAMADOL HCL 50 MG PO TABS: 50.0000 mg | ORAL_TABLET | Freq: Three times a day (TID) | ORAL | 1 refills | Status: DC | PRN

## 2017-02-19 NOTE — Telephone Encounter (Signed)
Pt not been seen in office since 11/2015 - needs OV for any refills.  [facility]d [facility] to [facility] time to have appt within that time

## 2017-02-19 NOTE — Telephone Encounter (Signed)
Last OV here 11/2015 - pt not been seen in office 15 months - needs OV for any refills.

## 2017-02-19 NOTE — Addendum Note (Signed)
Addended by: [name] on: 02/19/2017 07:56 AM   Modules accepted: Orders

## 2017-02-19 NOTE — Patient Instructions (Addendum)
     IF you received an x-ray today, you will receive an invoice from [facility]. Please contact [facility] at [phone] with questions or concerns regarding your invoice.   IF you received labwork today, you will receive an invoice from [facility]. Please contact [facility] at [phone] with questions or concerns regarding your invoice.   Our billing staff will not be able to assist you with questions regarding bills from these companies.  You will be contacted with the lab results as soon as they are available. The fastest way to get your results is to activate your My Chart account. Instructions are located on the last page of this paperwork. If you have not heard from us regarding the results in 2 weeks, please contact this office.     

## 2017-02-19 NOTE — Progress Notes (Signed)
Subjective:  By signing my name below, I, Elijah Marshall, attest that this documentation has been prepared under the direction and in the presence of Elijah Cheadle, Marshall. Electronically Signed: Moises Marshall, Merrill. 02/19/2017 , 12:49 PM .  Patient was seen in Room 1 .   Patient ID: Elijah Marshall, male    DOB: Aug 25, 1950, 67 y.o.   MRN: [id number] Chief Complaint  Patient presents with  . Medication Refill    lopressor, lonopin   HPI Elijah Marshall is a 67 y.o. male who presents to Primary Care at [facility] for medication refill.   Patient states he's doing generally well. He notes that he still has good and bad days. He's been taking metoprolol 25mg  half tablet QD (12.5mg  dose). His BP at Elijah Marshall office in June 2018 was 134/86. He checks his BP at home, runs about [id number] systolic.   He takes klonopin PRN to help with sleep. In the last 3-4 weeks, he hasn't been sleeping well.   He also mentions bilateral shoulder pain that started about 5 months ago. He describes it as heavy soreness. He informs having tendonitis in his shoulders many years ago. He takes tramadol, prescribed by Dr. Wynelle Marshall, for hip and back pain. He denies any weakness in his shoulders.   Past Medical History:  Diagnosis Date  . Allergy   . Arthritis   . Cancer [facility])    unsure at this time  . Cancer of lower lobe of right lung ([place])   . COPD (chronic obstructive pulmonary disease) ([place])    per patient's health survey - he put a ?  . Depression   . GERD (gastroesophageal reflux disease)   . Hypertension   . Non-small cell carcinoma of lung, right ([place]) 06/24/2015  . Shortness of breath   . Ulcer    Past Surgical History:  Procedure Laterality Date  . ANTERIOR FUSION LUMBAR SPINE  07/25/2012  . ANTERIOR LAT LUMBAR FUSION Left 07/25/2012   Procedure: ANTERIOR LATERAL LUMBAR FUSION 1 LEVEL;  Surgeon: Elijah Marshall;  Location: [place];  Service: Orthopedics;  Laterality: Left;  Left sided lumbar 3-4  lateral interbody fusion with allograft and instrumentation  . COLONOSCOPY  14   polyps rem  . JOINT REPLACEMENT Right 2010  . NODE DISSECTION Right 05/24/2015   Procedure: NODE DISSECTION;  Surgeon: Elijah Marshall;  Location: [place];  Service: Thoracic;  Laterality: Right;  . SEGMENTECOMY Right 05/24/2015   Procedure: RIGHT LOWER LOBE SUPERIOR SEGMENTECTOMY;  Surgeon: Elijah Marshall;  Location: [place];  Service: Thoracic;  Laterality: Right;  . [place]  2012  . VIDEO ASSISTED THORACOSCOPY (VATS)/WEDGE RESECTION Right 05/24/2015   Procedure: RIGHT VIDEO ASSISTED THORACOSCOPY (VATS)/ RIGHTR LOWER LOBE WEDGE RESECTION;  Surgeon: Elijah Marshall;  Location: [place];  Service: Thoracic;  Laterality: Right;   Prior to Admission medications   Medication Sig Start Date End Date Taking? Authorizing Provider  albuterol (PROAIR HFA) 108 (90 Base) MCG/ACT inhaler 2 puffs every 4 hours as needed only  if your can't catch your breath 01/10/16   Elijah Marshall  budesonide-formoterol [facility]) 160-4.5 MCG/ACT inhaler Take 2 puffs first thing in am and then another 2 puffs about 12 hours later. Patient not taking: Reported on 02/19/2017 12/29/15   Elijah Marshall  budesonide-formoterol (SYMBICORT) 160-4.5 MCG/ACT inhaler INHALE 2 PUFFS INTO THE LUNGS IN THE MORNING AND THEN 2 PUFFS 12 HOURS LATER 12/18/16   Wert,  Elijah Deem, Marshall  clonazePAM [facility]) 1 MG tablet Take 1/2 tablet as needed twice a day for anxiety and 1-2 tablets at night as needed for sleep 11/11/15   Elijah Marshall  Guaifenesin [facility] MAXIMUM STRENGTH) 1200 MG TB12 Take 1 tablet (1,200 mg total) by mouth every 12 (twelve) hours as needed. 08/26/15   Elijah Marshall  lansoprazole (PREVACID) 30 MG capsule Take 1 capsule (30 mg total) by mouth 2 (two) times daily before a meal. 01/12/16   Elijah Marshall  levocetirizine (XYZAL) 5 MG tablet Take 1 tablet (5 mg total) by mouth every evening. 01/12/16   Elijah Knapp,  Marshall  metoprolol tartrate (LOPRESSOR) 25 MG tablet TAKE 1/2 A TABLET BY MOUTH TWICE A DAY 02/19/17   Elijah Marshall  OXYGEN 2lpm with exertion only    Provider, Historical, Marshall  polyethylene glycol (MIRALAX) packet Take 17 g by mouth daily.    Provider, Historical, Marshall  pravastatin (PRAVACHOL) 40 MG tablet Take 1 tablet (40 mg total) by mouth at bedtime. 01/12/16   Elijah Marshall  predniSONE (DELTASONE) 10 MG tablet Take  4 each am x 2 days,   2 each am x 2 days,  1 each am x 2 days and stop Patient not taking: Reported on 02/19/2017 11/21/16   Elijah Marshall  ranitidine (ZANTAC) 150 MG capsule Take 150 mg by mouth every evening. As needed    Provider, Historical, Marshall  senna (SENOKOT) 8.6 MG TABS tablet Take 2 tablets (17.2 mg total) by mouth at bedtime. 06/24/15   Elijah Marshall  traMADol (ULTRAM) 50 MG tablet Take 50 mg by mouth every 8 (eight) hours as needed. for pain 10/31/16   Provider, Historical, Marshall   Allergies  Allergen Reactions  . Aspirin Nausea And Vomiting  . Morphine And Related Itching   Family History  Problem Relation Age of Onset  . Cancer Mother        colon  . Heart disease Father   . Diabetes Sister   . Hypertension Sister   . Cancer Sister   . Hypertension Brother   . Heart disease Maternal Grandmother        heart attack   Social History   Socioeconomic History  . Marital status: Married    Spouse name: None  . Number of children: None  . Years of education: None  . Highest education level: None  Social Needs  . Financial resource strain: None  . Food insecurity - worry: None  . Food insecurity - inability: None  . Transportation needs - medical: None  . Transportation needs - non-medical: None  Occupational History  . Occupation: truck driver-disability    Employer: [employer]  Tobacco Use  . Smoking status: Former Smoker    Packs/day: 1.00    Years: 35.00    Pack years: 35.00    Types: Cigarettes    Last attempt to quit: 09/24/2011     Years since quitting: 5.4  . Smokeless tobacco: Never Used  Substance and Sexual Activity  . Alcohol use: No    Alcohol/week: 0.0 oz    Comment: weekly  . Drug use: No  . Sexual activity: None  Other Topics Concern  . None  Social History Narrative  . None   Depression screen [facility] 2/9 02/19/2017 11/11/2015 11/09/2015 08/02/2015 06/24/2015  Decreased Interest 0 0 1 0 0  Down, Depressed, Hopeless 1 1 1  0 1  PHQ - 2 Score 1 1 2  0 1  Altered sleeping 2 1 1  - -  Tired, decreased energy 0 0 0 - -  Change in appetite 0 0 3 - -  Feeling bad or failure about yourself  1 0 0 - -  Trouble concentrating - 0 0 - -  Moving slowly or fidgety/restless 0 0 0 - -  Suicidal thoughts 0 0 0 - -  PHQ-9 Score 4 2 6  - -  Difficult doing work/chores Not difficult at all Not difficult at all Somewhat difficult - -    Review of Systems  Constitutional: Negative for fatigue and unexpected weight change.  Eyes: Negative for visual disturbance.  Respiratory: Negative for cough, chest tightness and shortness of breath.   Cardiovascular: Negative for chest pain, palpitations and leg swelling.  Gastrointestinal: Negative for abdominal pain and Marshall in stool.  Musculoskeletal: Positive for arthralgias.  Neurological: Negative for dizziness, weakness, light-headedness and headaches.  Psychiatric/Behavioral: Positive for sleep disturbance.       Objective:   Physical Exam  Constitutional: He is oriented to person, place, and time. He appears well-developed and well-nourished. No distress.  HENT:  Head: Normocephalic and atraumatic.  Eyes: EOM are normal. Pupils are equal, round, and reactive to light.  Neck: Neck supple.  Cardiovascular: Normal rate, regular rhythm and normal heart sounds.  No murmur heard. Pulmonary/Chest: Effort normal and breath sounds normal. No respiratory distress.  Musculoskeletal: Normal range of motion.  Neurological: He is alert and oriented to person, place, and time.  Skin:  Skin is warm and dry.  Psychiatric: He has a normal mood and affect. His behavior is normal.  Nursing note and vitals reviewed.   BP (!) 150/85   Pulse (!) 106   Temp 98 F (36.7 C) (Oral)   Resp 16   Ht 5\' 9"  (1.753 m)   Wt 180 lb (81.6 kg)   SpO2 94%   BMI 26.58 kg/m     EKG: NSR, no acute ischemic changes noted. No significant change noted when compared to prior EKG done 04/07/2013.   I have personally reviewed the EKG tracing and agree with the computer interpretation.  Assessment & Plan:   1. Chronic pain of both shoulders - on tramadol 50 bid from Dr. Maureen Ralphs for his hip but no longer seeing Dr. Maureen Ralphs since he has decided not to proceed with surgery  So I will start rxing - needs f/u OV for refill of the controlled meds  2. Essential hypertension - change metoprolol to bystolic since copd and s/p lung surgery on beta-agonist inhalers and BP poorly controlled - recheck in 2 mos  3. Primary insomnia - using klonopin 1mg  qhs sparingly    Orders Placed This Encounter  Procedures  . EKG 12-Lead    Meds ordered this encounter  Medications  . nebivolol (BYSTOLIC) 5 MG tablet    Sig: Take 1 tablet (5 mg total) by mouth daily.    Dispense:  30 tablet    Refill:  2  . clonazePAM (KLONOPIN) 1 MG tablet    Sig: Take 1/2 tablet as needed twice a day for anxiety and 1-2 tablets at night as needed for sleep    Dispense:  60 tablet    Refill:  1  . traMADol (ULTRAM) 50 MG tablet    Sig: Take 1 tablet (50 mg total) by mouth every 8 (eight) hours as needed. for pain    Dispense:  90 tablet  Refill:  1    I personally performed the services described in this documentation, which was scribed in my presence. The recorded information has been reviewed and considered, and addended by me as needed.   Elijah Marshall, M.D.  Primary Care at [facility] [address] [place], [place] [address] [phone] phone [phone] fax  02/22/17 8:11 AM

## 2017-02-21 ENCOUNTER — Encounter: Payer: Self-pay | Admitting: Internal Medicine

## 2017-02-21 ENCOUNTER — Ambulatory Visit (INDEPENDENT_AMBULATORY_CARE_PROVIDER_SITE_OTHER): Payer: Medicare Other | Admitting: Internal Medicine

## 2017-02-21 VITALS — BP 124/80 | HR 85 | Ht 69.0 in | Wt 180.4 lb

## 2017-02-21 DIAGNOSIS — J9611 Chronic respiratory failure with hypoxia: Secondary | ICD-10-CM

## 2017-02-21 DIAGNOSIS — J449 Chronic obstructive pulmonary disease, unspecified: Secondary | ICD-10-CM | POA: Diagnosis not present

## 2017-02-21 MED ORDER — GLYCOPYRROLATE-FORMOTEROL 9-4.8 MCG/ACT IN AERO: 2.0000 | INHALATION_SPRAY | Freq: Two times a day (BID) | RESPIRATORY_TRACT | 11 refills | Status: DC

## 2017-02-21 MED ORDER — GLYCOPYRROLATE-FORMOTEROL 9-4.8 MCG/ACT IN AERO: 2.0000 | INHALATION_SPRAY | Freq: Two times a day (BID) | RESPIRATORY_TRACT | 0 refills | Status: DC

## 2017-02-21 NOTE — Progress Notes (Signed)
Subjective:    Patient ID: Elijah Marshall, male    DOB: 01/23/1951  MRN: [id number]   Brief patient profile:  67  yobm quit smoking 2013  and able work out at [employer] and did fine until hurt back 2011 then 2 back surgeries and knee surgery  and more noticeable doe  since then but also limited by back and s/p wedge resection of RLL NSC lung ca Ia 05/2015  referred to pulmonary clinic 09/05/2013 for ? Copd  With GOLD II criteria 10/29/13 and confirmed 01/10/2016     History of Present Illness  09/05/2013 1st [facility] office visit/ Julita Ozbun  Chief Complaint  Patient presents with  . Pulmonary Consult    Referred per Dr. Norberto Sorenson.  Pt c/o SOB for the past 3 yrs. He states that he was dxed with COPD back in 2012 or 2013.  He states that that he sometimes has trouble with breathing when walking up stairs and lifting things.    rx spiriva first, alb, dulera not as effective  No problem at rest or supine Assoc nasal congestion much better p shot (?depomedrol) and bad again x sev years corresponds to worse doe  Ex = walking one mile slower pace than wife,trouble with hills, worse in heat  Bad hb even on dexilant  Min dry cough  rec Stop spiriva  Start anoro two puffs off one click each am Zantac 150 mg one at  Bedtime Prednisone 10 mg take  4 each am x 2 days,   2 each am x 2 days,  1 each am x 2 days and stop  GERD diet        01/10/2016  f/u ov/Amazing Cowman re: GOLD II/ symb 160 2bid  Chief Complaint  Patient presents with  . Follow-up    PFT today. Pt states his ears and nose are stuffy. He states his breathing is overall doing well and he is not coughing much.  He is using albuterol inhaler 2 x per wk on average.   MMRC2 = can't walk a nl pace on a flat grade s sob but does fine slow and flat eg shopping / can't do step  rec Plan A = Automatic = Symbicort 160 Take 2 puffs first thing in am and then another 2 puffs about 12 hours later.  Work on Archivist:  Plan B =  Backup Only use your albuterol (proair) as a rescue medication     05/16/2016  f/u ov/Tinea Nobile re:  GOLD II/ symb 160 2bid / has 02  conc for prn use  Chief Complaint  Patient presents with  . Follow-up    Pt needs o2 recert due to insurance change. He c/o raspy voice and watery eyes- relates to allergies. He also c/o night sweats recently.   maint on xyzal year round  Only using saba maybe once a week Doe still = MMRC2 = can't walk a nl pace on a flat grade s sob but does fine slow and flat  rec Prednisone 10 mg take 4 each am x 2 days,   2 each am x 2 days,  1 each am x 2 days and stop  02 should be 2lpm with activity and as needed at rest - we will have you checked also for nocturnal 02 needs  As needed for itching / sneezing / runny nose > xyzal as neeed  If not effective then stop xyzal >> For drainage / throat tickle try take  CHLORPHENIRAMINE  4 mg - take one every 4 hours as needed           11/21/2016  f/u ov/Kirbi Farrugia re:  GOLD II/ symb 160 2bid/ 2lpm exertion only  Chief Complaint  Patient presents with  . Follow-up    Increased cough and sneezing x 3-4 days- hard to produce sputum, but it's yellow when he does. He is using albuterol inhaler 2-3 x per wk on average.   poor insight into saba use / never tried 1st gen H1 blockers per guidelines  For rhinitis  Baseline no change doe = mmrc2 then gradually worse x 3 days assoc with watery rhinitis/ sneezing but did not increase saba as rec assoc with congested cough slt discolored mucus but nothing frankly purulent  rec Plan A = Automatic = symbicort 160 Take 2 puffs first thing in am and then another 2 puffs about 12 hours later.  Work on maintaining perfect  inhaler technique: Prednisone 10 mg take  4 each am x 2 days,   2 each am x 2 days,  1 each am x 2 days and stop  Plan B = Backup Only use your albuterol(ventolin)  as a rescue medication    02/21/2017  f/u ov/Mazi Schuff re:   GOLD II copd  Chief Complaint  Patient presents with    . Follow-up    He is noticing more DOE with exertion such as walking up stairs. He is having trouble producing sputum, and when he does it is yellow. He is using his albuterol inhaler 2 x per wk on average.   x one month more sob than usual with activity with baseline = MMRC2 = can't walk a nl pace on a flat grade s sob but does fine slow and flat with sats as low as 89%/ never tries albuterol to see if improves/only using 02 with exercise and not with exertion (very limited insight despite already completing rehab  Worse x one month Sleeping fine s am flare of cough /congestion  Normally able to walk 25 -30 min 3 x week with 02 2lpm  And last week had to stop p 15 min  No obvious day to day or daytime variability or assoc   mucus plugs or hemoptysis or cp or chest tightness, subjective wheeze or overt sinus or hb symptoms. No unusual exposure hx or h/o childhood pna/ asthma or knowledge of premature birth.  Sleeping ok flat without nocturnal  or early am exacerbation  of respiratory  c/o's or need for noct saba. Also denies any obvious fluctuation of symptoms with weather or environmental changes or other aggravating or alleviating factors except as outlined above   Current Allergies, Complete Past Medical History, Past Surgical History, Family History, and Social History were reviewed in [employer] record.  ROS  The following are not active complaints unless bolded Hoarseness, sore throat, dysphagia, dental problems, itching, sneezing,  nasal congestion or discharge of excess mucus or purulent secretions, ear ache,   fever, chills, sweats, unintended wt loss or wt gain, classically pleuritic or exertional cp,  orthopnea pnd or leg swelling, presyncope, palpitations, abdominal pain, anorexia, nausea, vomiting, diarrhea  or change in bowel habits or change in bladder habits, change in stools or change in urine, dysuria, hematuria,  rash, arthralgias, visual complaints,  headache, numbness, weakness or ataxia or problems with walking or coordination,  change in mood/affect or memory.        Current Meds  Medication Sig  .  albuterol (PROAIR HFA) 108 (90 Base) MCG/ACT inhaler 2 puffs every 4 hours as needed only  if your can't catch your breath  . clonazePAM (KLONOPIN) 1 MG tablet Take 1/2 tablet as needed twice a day for anxiety and 1-2 tablets at night as needed for sleep  . Guaifenesin (MUCINEX MAXIMUM STRENGTH) 1200 MG TB12 Take 1 tablet (1,200 mg total) by mouth every 12 (twelve) hours as needed.  . lansoprazole (PREVACID) 30 MG capsule Take 1 capsule (30 mg total) by mouth 2 (two) times daily before a meal.  . levocetirizine (XYZAL) 5 MG tablet Take 1 tablet (5 mg total) by mouth every evening.  . nebivolol (BYSTOLIC) 5 MG tablet Take 1 tablet (5 mg total) by mouth daily.  . OXYGEN 2lpm with exertion only  . polyethylene glycol (MIRALAX) packet Take 17 g by mouth daily.  . pravastatin (PRAVACHOL) 40 MG tablet Take 1 tablet (40 mg total) by mouth at bedtime.  . ranitidine (ZANTAC) 150 MG capsule Take 150 mg by mouth every evening. As needed  . senna (SENOKOT) 8.6 MG TABS tablet Take 2 tablets (17.2 mg total) by mouth at bedtime.  . traMADol (ULTRAM) 50 MG tablet Take 1 tablet (50 mg total) by mouth every 8 (eight) hours as needed. for pain  . [DISCONTINUED] budesonide-formoterol (SYMBICORT) 160-4.5 MCG/ACT inhaler Take 2 puffs first thing in am and then another 2 puffs about 12 hours later.                    Objective:   Physical Exam  amb bm nad    02/21/2017          07/31/2016        182  05/16/2016        181  04/28/2016        185  01/10/2016        179    11/19/15 182 lb (82.6 kg)  11/11/15 180 lb (81.6 kg)  11/04/15 181 lb 3.5 oz (82.2 kg)    Vital signs reviewed - Note on arrival 02 sats  91% on RA        HEENT: nl dentition,  and oropharynx. Nl external ear canals without cough reflex - mild bilateral non-specific turbinate  edema    NECK :  without JVD/Nodes/TM/ nl carotid upstrokes bilaterally   LUNGS: no acc muscle use,  slt barrel chest with distant bs assoc with minimal bilateral exp wheeze / better with plm    CV:  RRR  no s3 or murmur or increase in P2, and no edema   ABD:  soft and nontender with Pos mid inspiratory Hoover's sign  in the supine position. No bruits or organomegaly appreciated, bowel sounds nl  MS:  Nl gait/ ext warm without deformities, calf tenderness, cyanosis or clubbing No obvious joint restrictions   SKIN: warm and dry without lesions    NEURO:  alert, approp, nl sensorium with  no motor or cerebellar deficits apparent.      I personally reviewed images and agree with radiology impression as follows:   Chest CT  12/21/16 1. Postsurgical and post treatment changes in the right hemithorax without evidence of recurrent or metastatic disease. 2.  Aortic atherosclerosis (ICD10-170.0). 3.  Emphysema (ICD10-J43.9).

## 2017-02-21 NOTE — Assessment & Plan Note (Signed)
SATURATION QUALIFICATIONS:  11/19/2015  Patient Saturations on Room Air at Rest = 96% Patient Saturations on Room Air while Ambulating = 86% Patient Saturations on 2  Liters of oxygen while Ambulating = 96% - 05/16/2016 :  Patient Saturations on Room Air at Rest = 88%--- increased to 99% on 2lpm continuous o2//lmr  rec 2lpm with exertion but if not going to where 02 with activity should check sats occasionally to sort out whether lack of 02 is the limiting problem and if so be more consistent with 02 use with goals as above

## 2017-02-21 NOTE — Patient Instructions (Addendum)
Goal is to keep your 02 sats above 90% while walking   Try Plan A = Automatic = Bevespi Take 2 puffs first thing in am and then another 2 puffs about 12 hours later.     Plan B = Backup Only use your albuterol (Ventolin) as a rescue medication to be used if you can't catch your breath by resting or doing a relaxed purse lip breathing pattern.  - The less you use it, the better it will work when you need it. - Ok to use the inhaler up to 2 puffs  every 4 hours if you must but call for appointment if use goes up over your usual need - Don't leave home without it !!  (think of it like the spare tire for your car)     Work on inhaler technique:  relax and gently blow all the way out then take a nice smooth deep breath back in, triggering the inhaler at same time you start breathing in.  Hold for up to 5 seconds if you can. Blow out thru nose. Rinse and gargle with water when done      Please schedule a follow up visit in 3 months but call sooner if needed with pfts on return

## 2017-02-21 NOTE — Assessment & Plan Note (Signed)
-   Spirometry  03/01/12  FEV1  1.63 - Spirometry   09/05/13 FEV1  1.34 (46%) ratio 44  - PFTs   10/29/2013    FEV1    1.70 (62%) ratio 50% no better after ssaba and DLCO 48%  - 09/05/2013  Walked RA x 3 laps @ fast pace @  185 ft each stopped due to end of study, fast pace,sat 89% at end  - 01/06/14 gradual worsening on anoro plus increase need for saba  - 01/28/2014   > try spiriva respimat/ symbicort 160 2bid > changed to symbicort 160 2bid only on 01/28/14  - 02/25/2014   90%   > changed to symbicort 80 2bid since hoarse on 160 and clear on exam  - 11/19/2015 rechallenge with symbicort 160 Take 2 puffs first thing in am and then another 2 puffs about 12 hours later.  - PFT's  01/10/2016  FEV1 1.38 (51 % ) ratio 47  p 1 % improvement from saba p symb 160  prior to study with DLCO  43/41  % corrects to 47  % for alv volume    - 02/21/2017  After extensive coaching inhaler device  effectiveness =    90% vs baseline < 25%  - 02/21/2017 changed to bevespi 2 bid    Clearly Pt is Group B in terms of symptom/risk and laba/lama therefore appropriate rx at this point s enough AB/ exac pattern to warrant ICS at this point   I had an extended discussion with the patient reviewing all relevant studies completed to date and  lasting 15 to 20 minutes of a 25 minute visit    Each maintenance medication was reviewed in detail including most importantly the difference between maintenance and prns and under what circumstances the prns are to be triggered using an action plan format that is not reflected in the computer generated alphabetically organized AVS.    Please see AVS for specific instructions unique to this visit that I personally wrote and verbalized to the the pt in detail and then reviewed with pt  by my nurse highlighting any  changes in therapy recommended at today's visit to their plan of care.

## 2017-02-22 ENCOUNTER — Telehealth: Payer: Self-pay | Admitting: Family Medicine

## 2017-02-22 NOTE — Telephone Encounter (Signed)
Copied from [place] [phone]. Topic: Quick Communication - See Telephone Encounter >> Feb 22, 2017 11:33 AM Boyd Kerbs wrote: CRM for notification. See Telephone encounter for:   The medication for high blood pressure doctor prescribed is pricey and wanting to see if can prescribed something less costly. Pharmacy told him Atenolol or Bisoprolol would be less.   [id number] - [place], [place] [address] Phone: [phone] Fax: [phone]    02/22/17.

## 2017-02-26 MED ORDER — BISOPROLOL-HYDROCHLOROTHIAZIDE 2.5-6.25 MG PO TABS: 1.0000 | ORAL_TABLET | Freq: Every day | ORAL | 1 refills | Status: DC

## 2017-02-26 NOTE — Telephone Encounter (Signed)
Pt last seen 02/19/17 Pls advise

## 2017-02-26 NOTE — Telephone Encounter (Signed)
Pt notified and verbalized understanding.

## 2017-02-26 NOTE — Telephone Encounter (Signed)
bystolic that was rx'd would allow his inhalers to work better so was hoping that the change to bystolic from metoprolol 14.4 bid would help his shortness of breath. However, if the bystolic is to expensive, I have sent in bisoprolol-hctz 2.5-6.25 instead but this will likely have the same effect in blocking some of the effectivness of his inhalers that metoprolol does but hopefully it will be a little more effective for his BP. However, if the bisoprolol is more expensive than the metoprolol, fine to just stay on the metoprolol and we can recheck BP at his next visit to discuss other options.  For instance, it might be best to get him off of this type of BP medication all together and use something in a different class - will discuss at next OV.

## 2017-03-06 ENCOUNTER — Telehealth: Payer: Self-pay | Admitting: Internal Medicine

## 2017-03-06 MED ORDER — AZITHROMYCIN 250 MG PO TABS: 250.0000 mg | ORAL_TABLET | Freq: Every day | ORAL | 0 refills | Status: DC

## 2017-03-06 MED ORDER — PREDNISONE 10 MG PO TABS: ORAL_TABLET | ORAL | 0 refills | Status: DC

## 2017-03-06 NOTE — Telephone Encounter (Signed)
Called and spoke to pt who stated he has been coughing having white to yellow mucus, scratchy throat, and tightness in chest tightness that all began Sunday, 03/04/17. Pt denies any fever.  Dr. Melvyn Novas, please advise on recs for pt.  Thanks!

## 2017-03-06 NOTE — Telephone Encounter (Signed)
Called pt letting him know MW wanted [place] to send in two Rx to his pharmacy and if he was not back to baseline by next week for him to come in for an ov.  Pt expressed understanding. Rx sent to preferred pharmacy. Nothing further needed at this current time.

## 2017-03-06 NOTE — Telephone Encounter (Signed)
Prednisone 10 mg take  4 each am x 2 days,   2 each am x 2 days,  1 each am x 2 days and stop   zpak  F/u one week if not 100% back to baseline

## 2017-03-13 ENCOUNTER — Telehealth: Payer: Self-pay | Admitting: Internal Medicine

## 2017-03-13 MED ORDER — PREDNISONE 10 MG PO TABS: ORAL_TABLET | ORAL | 0 refills | Status: DC

## 2017-03-13 NOTE — Telephone Encounter (Signed)
Pt is aware of below message and voiced his understanding. Rx for Prednisone has been sent to preferred pharmacy. Nothing further is needed.

## 2017-03-13 NOTE — Telephone Encounter (Signed)
Prednisone 10 mg take  4 each am x 2 days,   2 each am x 2 days,  1 each am x 2 days and stop  

## 2017-03-13 NOTE — Telephone Encounter (Signed)
Called and spoke with pt. Pt states he completed zpak on Sunday that was prescribed on 03/06/17. Pt states symptoms did improve but did not completely subside. Pt reports of prod cough with white mucus & nasal drainage white in color x1w Pt taken mucinex BID with mild improvement.   MW please advise.   AVS 02/21/17  Patient Instructions    Goal is to keep your 02 sats above 90% while walking   Try Plan A = Automatic = Bevespi Take 2 puffs first thing in am and then another 2 puffs about 12 hours later.     Plan B = Backup Only use your albuterol (Ventolin) as a rescue medication to be used if you can't catch your breath by resting or doing a relaxed purse lip breathing pattern.  - The less you use it, the better it will work when you need it. - Ok to use the inhaler up to 2 puffs  every 4 hours if you must but call for appointment if use goes up over your usual need - Don't leave home without it !!  (think of it like the spare tire for your car)     Work on inhaler technique:  relax and gently blow all the way out then take a nice smooth deep breath back in, triggering the inhaler at same time you start breathing in.  Hold for up to 5 seconds if you can. Blow out thru nose. Rinse and gargle with water when done      Please schedule a follow up visit in 3 months but call sooner if needed with pfts on return            After Visit Summary (Printed 02/21/2017)

## 2017-03-28 ENCOUNTER — Telehealth: Payer: Self-pay | Admitting: Internal Medicine

## 2017-03-28 MED ORDER — GLYCOPYRROLATE-FORMOTEROL 9-4.8 MCG/ACT IN AERO: 2.0000 | INHALATION_SPRAY | Freq: Two times a day (BID) | RESPIRATORY_TRACT | 0 refills | Status: DC

## 2017-03-28 NOTE — Telephone Encounter (Signed)
Received letter from [place] is not covered by ins  Per MW -provide him with samples and schedule ov with drug formulary  Spoke with the pt and notified of these recs and he verbalized understanding  Samples up front and he is aware to bring formulary to ov

## 2017-04-12 ENCOUNTER — Other Ambulatory Visit: Payer: Self-pay | Admitting: Family Medicine

## 2017-04-12 NOTE — Telephone Encounter (Signed)
LOV over a year ago.  Dr. Brigitte Pulse  Prevacid  [id number] - [place], [place]

## 2017-04-17 ENCOUNTER — Ambulatory Visit (INDEPENDENT_AMBULATORY_CARE_PROVIDER_SITE_OTHER): Payer: Medicare Other

## 2017-04-17 ENCOUNTER — Other Ambulatory Visit: Payer: Self-pay

## 2017-04-17 ENCOUNTER — Ambulatory Visit (INDEPENDENT_AMBULATORY_CARE_PROVIDER_SITE_OTHER): Payer: Medicare Other | Admitting: Family Medicine

## 2017-04-17 ENCOUNTER — Encounter: Payer: Self-pay | Admitting: Family Medicine

## 2017-04-17 VITALS — BP 130/78 | HR 98 | Temp 97.6°F | Resp 18 | Ht 69.0 in | Wt 179.4 lb

## 2017-04-17 VITALS — BP 132/90 | HR 98 | Ht 69.0 in | Wt 179.2 lb

## 2017-04-17 DIAGNOSIS — M25551 Pain in right hip: Secondary | ICD-10-CM | POA: Diagnosis not present

## 2017-04-17 DIAGNOSIS — G8929 Other chronic pain: Secondary | ICD-10-CM

## 2017-04-17 DIAGNOSIS — R6889 Other general symptoms and signs: Secondary | ICD-10-CM | POA: Diagnosis not present

## 2017-04-17 DIAGNOSIS — M25561 Pain in right knee: Secondary | ICD-10-CM

## 2017-04-17 DIAGNOSIS — M25511 Pain in right shoulder: Secondary | ICD-10-CM | POA: Diagnosis not present

## 2017-04-17 DIAGNOSIS — Z1211 Encounter for screening for malignant neoplasm of colon: Secondary | ICD-10-CM | POA: Diagnosis not present

## 2017-04-17 DIAGNOSIS — G629 Polyneuropathy, unspecified: Secondary | ICD-10-CM

## 2017-04-17 DIAGNOSIS — I10 Essential (primary) hypertension: Secondary | ICD-10-CM | POA: Diagnosis not present

## 2017-04-17 DIAGNOSIS — M25552 Pain in left hip: Secondary | ICD-10-CM | POA: Diagnosis not present

## 2017-04-17 DIAGNOSIS — G894 Chronic pain syndrome: Secondary | ICD-10-CM | POA: Diagnosis not present

## 2017-04-17 DIAGNOSIS — E7800 Pure hypercholesterolemia, unspecified: Secondary | ICD-10-CM

## 2017-04-17 DIAGNOSIS — M25512 Pain in left shoulder: Secondary | ICD-10-CM

## 2017-04-17 DIAGNOSIS — Z23 Encounter for immunization: Secondary | ICD-10-CM

## 2017-04-17 DIAGNOSIS — Z0000 Encounter for general adult medical examination without abnormal findings: Secondary | ICD-10-CM | POA: Diagnosis not present

## 2017-04-17 DIAGNOSIS — K219 Gastro-esophageal reflux disease without esophagitis: Secondary | ICD-10-CM

## 2017-04-17 DIAGNOSIS — M25562 Pain in left knee: Secondary | ICD-10-CM

## 2017-04-17 DIAGNOSIS — R21 Rash and other nonspecific skin eruption: Secondary | ICD-10-CM

## 2017-04-17 DIAGNOSIS — M1990 Unspecified osteoarthritis, unspecified site: Secondary | ICD-10-CM

## 2017-04-17 NOTE — Patient Instructions (Addendum)
[name] , Thank you for taking time to come for your Medicare Wellness Visit. I appreciate your ongoing commitment to your health goals. Please review the following plan we discussed and let me know if I can assist you in the future.   Screening recommendations/referrals: Colonoscopy: [facility] will contact you to set up the appointment for this.  Recommended yearly ophthalmology/optometry visit for glaucoma screening and checkup  Recommended yearly dental visit for hygiene and checkup  Vaccinations: Influenza vaccine: up to date Pneumococcal vaccine: administered today  Tdap vaccine: up to date, next due 02/07/2020 Shingles vaccine: Check with your pharmacy about receiving the Shingrix vaccine     Advanced directives: Advance directive discussed with you today. Even though you declined this today please call our office should you change your mind and we can give you the proper paperwork for you to fill out.   Conditions/risks identified:  Try to lose some weight and get to around 170 lbs in the future.    Next appointment: today @ 4:40 pm with Dr. Brigitte Pulse, next AWV in 1 year   Preventive Care 66 Years and Older, Male Preventive care refers to lifestyle choices and visits with your health care provider that can promote health and wellness. What does preventive care include?  A yearly physical exam. This is also called an annual well check.  Dental exams once or twice a year.  Routine eye exams. Ask your health care provider how often you should have your eyes checked.  Personal lifestyle choices, including:  Daily care of your teeth and gums.  Regular physical activity.  Eating a healthy diet.  Avoiding tobacco and drug use.  Limiting alcohol use.  Practicing safe sex.  Taking low doses of aspirin every day.  Taking vitamin and mineral supplements as recommended by your health care provider. What happens during an annual well check? The services and screenings done  by your health care provider during your annual well check will depend on your age, overall health, lifestyle risk factors, and family history of disease. Counseling  Your health care provider may ask you questions about your:  Alcohol use.  Tobacco use.  Drug use.  Emotional well-being.  Home and relationship well-being.  Sexual activity.  Eating habits.  History of falls.  Memory and ability to understand (cognition).  Work and work Statistician. Screening  You may have the following tests or measurements:  Height, weight, and BMI.  Blood pressure.  Lipid and cholesterol levels. These may be checked every 5 years, or more frequently if you are over 29 years old.  Skin check.  Lung cancer screening. You may have this screening every year starting at age 66 if you have a 30-pack-year history of smoking and currently smoke or have quit within the past 15 years.  Fecal occult blood test (FOBT) of the stool. You may have this test every year starting at age 66.  Flexible sigmoidoscopy or colonoscopy. You may have a sigmoidoscopy every 5 years or a colonoscopy every 10 years starting at age 66.  Prostate cancer screening. Recommendations will vary depending on your family history and other risks.  Hepatitis C blood test.  Hepatitis B blood test.  Sexually transmitted disease (STD) testing.  Diabetes screening. This is done by checking your blood sugar (glucose) after you have not eaten for a while (fasting). You may have this done every 1-3 years.  Abdominal aortic aneurysm (AAA) screening. You may need this if you are a current or former  smoker.  Osteoporosis. You may be screened starting at age 66 if you are at high risk. Talk with your health care provider about your test results, treatment options, and if necessary, the need for more tests. Vaccines  Your health care provider may recommend certain vaccines, such as:  Influenza vaccine. This is recommended every  year.  Tetanus, diphtheria, and acellular pertussis (Tdap, Td) vaccine. You may need a Td booster every 10 years.  Zoster vaccine. You may need this after age 66.  Pneumococcal 13-valent conjugate (PCV13) vaccine. One dose is recommended after age 66.  Pneumococcal polysaccharide (PPSV23) vaccine. One dose is recommended after age 66. Talk to your health care provider about which screenings and vaccines you need and how often you need them. This information is not intended to replace advice given to you by your health care provider. Make sure you discuss any questions you have with your health care provider. Document Released: 02/19/2015 Document Revised: 10/13/2015 Document Reviewed: 11/24/2014 Elsevier Interactive Patient Education  2017 [place] Prevention in the Home Falls can cause injuries. They can happen to people of all ages. There are many things you can do to make your home safe and to help prevent falls. What can I do on the outside of my home?  Regularly fix the edges of walkways and driveways and fix any cracks.  Remove anything that might make you trip as you walk through a door, such as a raised step or threshold.  Trim any bushes or trees on the path to your home.  Use bright outdoor lighting.  Clear any walking paths of anything that might make someone trip, such as rocks or tools.  Regularly check to see if handrails are loose or broken. Make sure that both sides of any steps have handrails.  Any raised decks and porches should have guardrails on the edges.  Have any leaves, snow, or ice cleared regularly.  Use sand or salt on walking paths during winter.  Clean up any spills in your garage right away. This includes oil or grease spills. What can I do in the bathroom?  Use night lights.  Install grab bars by the toilet and in the tub and shower. Do not use towel bars as grab bars.  Use non-skid mats or decals in the tub or shower.  If you  need to sit down in the shower, use a plastic, non-slip stool.  Keep the floor dry. Clean up any water that spills on the floor as soon as it happens.  Remove soap buildup in the tub or shower regularly.  Attach bath mats securely with double-sided non-slip rug tape.  Do not have throw rugs and other things on the floor that can make you trip. What can I do in the bedroom?  Use night lights.  Make sure that you have a light by your bed that is easy to reach.  Do not use any sheets or blankets that are too big for your bed. They should not hang down onto the floor.  Have a firm chair that has side arms. You can use this for support while you get dressed.  Do not have throw rugs and other things on the floor that can make you trip. What can I do in the kitchen?  Clean up any spills right away.  Avoid walking on wet floors.  Keep items that you use a lot in easy-to-reach places.  If you need to reach something above you, use a  strong step stool that has a grab bar.  Keep electrical cords out of the way.  Do not use floor polish or wax that makes floors slippery. If you must use wax, use non-skid floor wax.  Do not have throw rugs and other things on the floor that can make you trip. What can I do with my stairs?  Do not leave any items on the stairs.  Make sure that there are handrails on both sides of the stairs and use them. Fix handrails that are broken or loose. Make sure that handrails are as long as the stairways.  Check any carpeting to make sure that it is firmly attached to the stairs. Fix any carpet that is loose or worn.  Avoid having throw rugs at the top or bottom of the stairs. If you do have throw rugs, attach them to the floor with carpet tape.  Make sure that you have a light switch at the top of the stairs and the bottom of the stairs. If you do not have them, ask someone to add them for you. What else can I do to help prevent falls?  Wear shoes  that:  Do not have high heels.  Have rubber bottoms.  Are comfortable and fit you well.  Are closed at the toe. Do not wear sandals.  If you use a stepladder:  Make sure that it is fully opened. Do not climb a closed stepladder.  Make sure that both sides of the stepladder are locked into place.  Ask someone to hold it for you, if possible.  Clearly mark and make sure that you can see:  Any grab bars or handrails.  First and last steps.  Where the edge of each step is.  Use tools that help you move around (mobility aids) if they are needed. These include:  Canes.  Walkers.  Scooters.  Crutches.  Turn on the lights when you go into a dark area. Replace any light bulbs as soon as they burn out.  Set up your furniture so you have a clear path. Avoid moving your furniture around.  If any of your floors are uneven, fix them.  If there are any pets around you, be aware of where they are.  Review your medicines with your doctor. Some medicines can make you feel dizzy. This can increase your chance of falling. Ask your doctor what other things that you can do to help prevent falls. This information is not intended to replace advice given to you by your health care provider. Make sure you discuss any questions you have with your health care provider. Document Released: 11/19/2008 Document Revised: 07/01/2015 Document Reviewed: 02/27/2014 Elsevier Interactive Patient Education  2017 [employer].

## 2017-04-17 NOTE — Progress Notes (Addendum)
Subjective:  By signing my name below, I, Elijah Marshall, attest that this documentation has been prepared under the direction and in the presence of [name], MD. Electronically Signed: Moises Marshall, [place]. 04/17/2017 , 5:36 PM .  Patient was seen in Room 1 .   Patient ID: Elijah Marshall, male    DOB: 06-21-1950, 67 y.o.   MRN: [id number] Chief Complaint  Patient presents with  . Hypertension    Pt states he is here to follow up on change of BP med.   . Follow-up   HPI Elijah Marshall is a 67 y.o. male who presents to Primary Care at [facility] for follow up. He had his Medicare annual wellness visit with Andrez Grime today, and received pneumonia shot.   Chronic Pain Syndrome Patient takes tramadol 2-3 times a day for chronic back/hip pain. He also has a history of shoulder tendonitis, and has had over 6 months of shoulder heaviness and soreness. His medication was prescribed by his orthopedist Dr. Jeffie Pollock, but at his last visit 2 months ago, pt requested that he transfer the rxing/mngmnt of this med to me since he is seen here on a more routine basis.  When I saw him previously at last visit 2 months ago, I had prescribed him #90 tramadol with a refill; however, patient was only dispensed 21 tablets by the pharmacist without any refills avail on the bottle and there was notification let along explanation to pt or myself that quantity dispensed was different from ordered and refills voided. Therefore, he has since received 2 scripts for #60 from Dr. Wynelle Link, last filled 04/10/17. He reports pain is still present, and is still deciding if he should have surgery.  Occ Insomnia/Anxiety  He also takes klonopin 1mg  for sleep occasionally.   COPD Sees Dr. Melvyn Novas regularly. He was told to start on 2L of oxygen for exertion and consider checking his O2 sats with exertion to see if whether low oxygen is his limiting problem. He was changed from Symbicort to [place]. He has an appointment with Dr.  Melvyn Novas in 2 days.   Urology Patient saw urologist, Dr. Pilar Jarvis, 1 year prior 05/29/16 for erectile dysfunction due to arterial insufficiency (started on prn sildenafil 20-100 mg qd prn, BPH w/ LUTS (nocturia) and screening prostate exam with PSA done at that time; pt plans to cont to been seen at [facility] annually.   HTN He mentions bisoprolol by itself was about $40, and now, the bisoprolol-HCTZ combo pill is only about $5.   Rash He noticed a rash recently about 2-3 weeks with red spots over his chest and skin peeling over his forearms. He isn't sure if it's due to medication, or if his wife switched laundry detergent from [place] to [place]. He's been applying moisturizing lotion. He mentions previously using Sulfate-8 shampoo for dandruff problem and now he's stopped using it.     Past Medical History:  Diagnosis Date  . Allergy   . Arthritis   . Cancer [facility])    unsure at this time  . Cancer of lower lobe of right lung ([place])   . COPD (chronic obstructive pulmonary disease) ([place])    per patient's health survey - he put a ?  . Depression   . GERD (gastroesophageal reflux disease)   . Hypertension   . Non-small cell carcinoma of lung, right ([place]) 06/24/2015  . Shortness of breath   . Ulcer    Past Surgical History:  Procedure Laterality Date  .  ANTERIOR FUSION LUMBAR SPINE  07/25/2012  . ANTERIOR LAT LUMBAR FUSION Left 07/25/2012   Procedure: ANTERIOR LATERAL LUMBAR FUSION 1 LEVEL;  Surgeon: [name], MD;  Location: [place];  Service: Orthopedics;  Laterality: Left;  Left sided lumbar 3-4 lateral interbody fusion with allograft and instrumentation  . COLONOSCOPY  14   polyps rem  . JOINT REPLACEMENT Right 2010  . NODE DISSECTION Right 05/24/2015   Procedure: NODE DISSECTION;  Surgeon: [name], MD;  Location: [place];  Service: Thoracic;  Laterality: Right;  . SEGMENTECOMY Right 05/24/2015   Procedure: RIGHT LOWER LOBE SUPERIOR SEGMENTECTOMY;  Surgeon: [name], MD;  Location: [place];  Service: Thoracic;  Laterality: Right;  . [place]  2012  . VIDEO ASSISTED THORACOSCOPY (VATS)/WEDGE RESECTION Right 05/24/2015   Procedure: RIGHT VIDEO ASSISTED THORACOSCOPY (VATS)/ RIGHTR LOWER LOBE WEDGE RESECTION;  Surgeon: [name], MD;  Location: [place];  Service: Thoracic;  Laterality: Right;   Prior to Admission medications   Medication Sig Start Date End Date Taking? Authorizing Provider  albuterol (PROAIR HFA) 108 (90 Base) MCG/ACT inhaler 2 puffs every 4 hours as needed only  if your can't catch your breath 01/10/16   [name], MD  bisoprolol-hydrochlorothiazide [facility]) 2.5-6.25 MG tablet Take 1 tablet by mouth daily. 02/26/17   [name], MD  clonazePAM [facility]) 1 MG tablet Take 1/2 tablet as needed twice a day for anxiety and 1-2 tablets at night as needed for sleep 02/19/17   [name], MD  Glycopyrrolate-Formoterol (BEVESPI AEROSPHERE) 9-4.8 MCG/ACT AERO Inhale 2 puffs into the lungs 2 (two) times daily. 03/28/17   [name], MD  Guaifenesin [facility] MAXIMUM STRENGTH) 1200 MG TB12 Take 1 tablet (1,200 mg total) by mouth every 12 (twelve) hours as needed. 08/26/15   [name], MD  lansoprazole (PREVACID) 30 MG capsule Take 1 capsule (30 mg total) by mouth 2 (two) times daily before a meal. 01/12/16   [name], MD  levocetirizine (XYZAL) 5 MG tablet Take 1 tablet (5 mg total) by mouth every evening. 01/12/16   [name], MD  OXYGEN 2lpm with exertion only    [provider]  polyethylene glycol (MIRALAX) packet Take 17 g by mouth daily.    [provider]  pravastatin (PRAVACHOL) 40 MG tablet Take 1 tablet (40 mg total) by mouth at bedtime. 01/12/16   [name], MD  predniSONE (DELTASONE) 10 MG tablet Take 4x2days,2x2days,1x2days,thenstop 03/06/17   [name], MD  ranitidine (ZANTAC) 150 MG capsule Take 150 mg by mouth every evening. As needed    [provider]  senna (SENOKOT) 8.6  MG TABS tablet Take 2 tablets (17.2 mg total) by mouth at bedtime. 06/24/15   [name], MD  traMADol (ULTRAM) 50 MG tablet Take 1 tablet (50 mg total) by mouth every 8 (eight) hours as needed. for pain 02/19/17   [name], MD   Allergies  Allergen Reactions  . Aspirin Nausea And Vomiting  . Morphine And Related Itching   Family History  Problem Relation Age of Onset  . Cancer Mother        colon  . Heart disease Father   . Diabetes Sister   . Hypertension Sister   . Cancer Sister   . Hypertension Brother   . Heart disease Maternal Grandmother        heart attack   Social History   Socioeconomic  History  . Marital status: Married    Spouse name: None  . Number of children: None  . Years of education: None  . Highest education level: High school graduate  Social Needs  . Financial resource strain: Not hard at all  . Food insecurity - worry: Never true  . Food insecurity - inability: Never true  . Transportation needs - medical: No  . Transportation needs - non-medical: No  Occupational History  . Occupation: truck driver-disability    Employer: [employer]  Tobacco Use  . Smoking status: Former Smoker    Packs/day: 1.00    Years: 35.00    Pack years: 35.00    Types: Cigarettes    Last attempt to quit: 09/24/2011    Years since quitting: 5.5  . Smokeless tobacco: Never Used  Substance and Sexual Activity  . Alcohol use: No    Alcohol/week: 0.0 oz    Comment: weekly  . Drug use: No  . Sexual activity: None  Other Topics Concern  . None  Social History Narrative  . None   Depression screen [facility] 2/9 04/17/2017 02/19/2017 11/11/2015 11/09/2015 08/02/2015  Decreased Interest 0 0 0 1 0  Down, Depressed, Hopeless 2 1 1 1  0  PHQ - 2 Score 2 1 1 2  0  Altered sleeping 0 2 1 1  -  Tired, decreased energy 0 0 0 0 -  Change in appetite 0 0 0 3 -  Feeling bad or failure about yourself  2 1 0 0 -  Trouble concentrating 0 - 0 0 -  Moving slowly or fidgety/restless 0 0 0  0 -  Suicidal thoughts 0 0 0 0 -  PHQ-9 Score 4 4 2 6  -  Difficult doing work/chores Somewhat difficult Not difficult at all Not difficult at all Somewhat difficult -    Review of Systems  Constitutional: Negative for fatigue and unexpected weight change.  Eyes: Negative for visual disturbance.  Respiratory: Negative for cough, chest tightness and shortness of breath.   Cardiovascular: Negative for chest pain, palpitations and leg swelling.  Gastrointestinal: Negative for abdominal pain and Marshall in stool.  Musculoskeletal: Positive for arthralgias (chronic) and back pain (chronic).  Skin: Positive for rash.  Neurological: Negative for dizziness, light-headedness and headaches.       Objective:   Physical Exam  Constitutional: He is oriented to person, place, and time. He appears well-developed and well-nourished. No distress.  HENT:  Head: Normocephalic and atraumatic.  Eyes: EOM are normal. Pupils are equal, round, and reactive to light.  Neck: Neck supple.  Cardiovascular: Normal rate.  Pulmonary/Chest: Effort normal. No respiratory distress.  Musculoskeletal: Normal range of motion.  Neurological: He is alert and oriented to person, place, and time.  Skin: Skin is warm and dry.  Psychiatric: He has a normal mood and affect. His behavior is normal.  Nursing note and vitals reviewed.   BP 132/90   Pulse 98   Temp 97.6 F (36.4 C) (Oral)   Resp 18   Ht 5\' 9"  (1.753 m)   Wt 179 lb 6.4 oz (81.4 kg)   SpO2 93%   BMI 26.49 kg/m    [5:24PM] BP recheck, left arm, manual: 130/78     Assessment & Plan:   1. Essential hypertension - poss fixed drug reaction rash to bisoprolol-hctz 2.5-6.25 so try stopping it x 1-2 weeks - use metoprolol (was on prev and still has some left at home) in its place. Resume bisoprolol-hctz if rash does  not improve during short trial off.    2. Chronic pain syndrome - on tramadol bid from ortho but no longer needs to f/u w/ them since not  proceeding w/ surgery so I will rx now - ok to increase to tid due to steadily worsening arthralgias.  3. Chronic pain of both shoulders   4. Chronic arthralgias of knees and hips   5. Acquired polyneuropathy - check tsh and b12 - need to check hgba1c with next labs  6. Multiple complaints - occ difficulty sleeping which he then uses klonopin for - very effective, uses sparingly, is aware of potential interaction with tramadol and respiratory suppression would be sig more harmful to pt due to COPD and h/o hypoxia - always separate use by >4 hrs.  7. Gastroesophageal reflux disease, esophagitis presence not specified - was prev on lansoprazole 30 bid but his insurance will over cover it qd. Fortunately, not currently needed as sxs controlled on prn ranitidine  8. Pure hypercholesterolemia -on pravastatin 40 - started 2 yrs prior when LDL 171. rtc for fasting lab only visit  9. Arthritis   10.    Papular rash, localized - poss fixed drug reaction rash to bisoprolol-hctz 2.5-6.25 so try stopping it x 1-2 weeks - use metoprolol (was on prev and still has some left at home) in its place. Resume bisoprolol-hctz if rash does not improve during short trial off.   Wonder if could be poss fixed drug rxn to [employer] - pt can discuss w/ pulm prn. Change to hypoallergenic detergent.  Orders Placed This Encounter  Procedures  . Lipid panel    Standing Status:   Future    Standing Expiration Date:   04/18/2018    Order Specific Question:   Has the patient fasted?    Answer:   Yes  . Vitamin B12    Standing Status:   Future    Standing Expiration Date:   04/18/2018  . TSH    Standing Status:   Future    Standing Expiration Date:   04/18/2018     I personally performed the services described in this documentation, which was scribed in my presence. The recorded information has been reviewed and considered, and addended by me as needed.   [name], M.D.  Primary Care at [facility] [address] [place], [place] [address] [phone] phone [phone] fax  04/19/17 8:54 AM

## 2017-04-17 NOTE — Patient Instructions (Addendum)
Try switching off the bisoprolol-hctz back onto the metoprolol for a week or two. If the rash doesn't get better, then go back onto the bisoprolol-hctz.  Ask Dr. Melvyn Novas about the possibility of it being the [facility].  Consider changing to a "free and clear" detergent with no dyes or perfumes.  Please come by the office to have your FASTING labs checked this week. (You can walk-in to the [place] same-day appointment clinic for this and let the front desk that you are there for a lab-only visit and you do not need to see a provider.)   IF you received an x-ray today, you will receive an invoice from [facility]. Please contact [facility] at [phone] with questions or concerns regarding your invoice.   IF you received labwork today, you will receive an invoice from [facility]. Please contact [facility] at [phone] with questions or concerns regarding your invoice.   Our billing staff will not be able to assist you with questions regarding bills from these companies.  You will be contacted with the lab results as soon as they are available. The fastest way to get your results is to activate your My Chart account. Instructions are located on the last page of this paperwork. If you have not heard from [place] regarding the results in 2 weeks, please contact this office.     Atopic Dermatitis Atopic dermatitis is a skin disorder that causes inflammation of the skin. This is the most common type of eczema. Eczema is a group of skin conditions that cause the skin to be itchy, red, and swollen. This condition is generally worse during the cooler winter months and often improves during the warm summer months. Symptoms can vary from person to person. Atopic dermatitis usually starts showing signs in infancy and can last through adulthood. This condition cannot be passed from one person to another (non-contagious), but it is more common in families. Atopic dermatitis may not always be  present. When it is present, it is called a flare-up. What are the causes? The exact cause of this condition is not known. Flare-ups of the condition may be triggered by:  Contact with something that you are sensitive or allergic to.  Stress.  Certain foods.  Extremely hot or cold weather.  Harsh chemicals and soaps.  Dry air.  Chlorine.  What increases the risk? This condition is more likely to develop in people who have a personal history or family history of eczema, allergies, asthma, or hay fever. What are the signs or symptoms? Symptoms of this condition include:  Dry, scaly skin.  Red, itchy rash.  Itchiness, which can be severe. This may occur before the skin rash. This can make sleeping difficult.  Skin thickening and cracking that can occur over time.  How is this diagnosed? This condition is diagnosed based on your symptoms, a medical history, and a physical exam. How is this treated? There is no cure for this condition, but symptoms can usually be controlled. Treatment focuses on:  Controlling the itchiness and scratching. You may be given medicines, such as antihistamines or steroid creams.  Limiting exposure to things that you are sensitive or allergic to (allergens).  Recognizing situations that cause stress and developing a plan to manage stress.  If your atopic dermatitis does not get better with medicines, or if it is all over your body (widespread), a treatment using a specific type of light (phototherapy) may be used. Follow these instructions at home: Skin care  Keep your  skin well-moisturized. Doing this seals in moisture and helps to prevent dryness. ? Use unscented lotions that have petroleum in them. ? Avoid lotions that contain alcohol or water. They can dry the skin.  Keep baths or showers short (less than 5 minutes) in warm water. Do not use hot water. ? Use mild, unscented cleansers for bathing. Avoid soap and bubble bath. ? Apply a  moisturizer to your skin right after a bath or shower.  Do not apply anything to your skin without checking with your health care provider. General instructions  Dress in clothes made of cotton or cotton blends. Dress lightly because heat increases itchiness.  When washing your clothes, rinse your clothes twice so all of the soap is removed.  Avoid any triggers that can cause a flare-up.  Try to manage your stress.  Keep your fingernails cut short.  Avoid scratching. Scratching makes the rash and itchiness worse. It may also result in a skin infection (impetigo) due to a break in the skin caused by scratching.  Take or apply over-the-counter and prescription medicines only as told by your health care provider.  Keep all follow-up visits as told by your health care provider. This is important.  Do not be around people who have cold sores or fever blisters. If you get the infection, it may cause your atopic dermatitis to worsen. Contact a health care provider if:  Your itchiness interferes with sleep.  Your rash gets worse or it is not better within one week of starting treatment.  You have a fever.  You have a rash flare-up after having contact with someone who has cold sores or fever blisters. Get help right away if:  You develop pus or soft yellow scabs in the rash area. Summary  This condition causes a red rash and itchy, dry, scaly skin.  Treatment focuses on controlling the itchiness and scratching, limiting exposure to things that you are sensitive or allergic to (allergens), recognizing situations that cause stress, and developing a plan to manage stress.  Keep your skin well-moisturized.  Keep baths or showers shorter than 5 minutes and use warm water. Do not use hot water. This information is not intended to replace advice given to you by your health care provider. Make sure you discuss any questions you have with your health care provider. Document Released:  01/21/2000 Document Revised: 02/25/2016 Document Reviewed: 02/25/2016 Elsevier Interactive Patient Education  [employer].

## 2017-04-17 NOTE — Progress Notes (Signed)
Subjective:   Elijah Marshall is a 67 y.o. male who presents for an Initial Medicare Annual Wellness Visit.  Review of Systems   N/A Cardiac Risk Factors include: advanced age (>10men, >7 women);dyslipidemia;hypertension;male gender    Objective:    Today's Vitals   04/17/17 1541 04/17/17 1548  BP: 132/90   Pulse: 98   SpO2: 93%   Weight: 179 lb 4 oz (81.3 kg)   Height: 5\' 9"  (1.753 m)   PainSc:  6    Body mass index is 26.47 kg/m.  Advanced Directives 04/17/2017 06/26/2016 12/27/2015 08/02/2015 07/13/2015 06/24/2015 06/24/2015  Does Patient Have a Medical Advance Directive? No No No No No No No  Would patient like information on creating a medical advance directive? No - Patient declined - - Yes - Scientist, clinical (histocompatibility and immunogenetics) given Yes - Scientist, clinical (histocompatibility and immunogenetics) given No - patient declined information -    Current Medications (verified) Outpatient Encounter Medications as of 04/17/2017  Medication Sig  . albuterol (PROAIR HFA) 108 (90 Base) MCG/ACT inhaler 2 puffs every 4 hours as needed only  if your can't catch your breath  . bisoprolol-hydrochlorothiazide (ZIAC) 2.5-6.25 MG tablet Take 1 tablet by mouth daily.  . clonazePAM (KLONOPIN) 1 MG tablet Take 1/2 tablet as needed twice a day for anxiety and 1-2 tablets at night as needed for sleep  . Glycopyrrolate-Formoterol (BEVESPI AEROSPHERE) 9-4.8 MCG/ACT AERO Inhale 2 puffs into the lungs 2 (two) times daily.  . Guaifenesin (MUCINEX MAXIMUM STRENGTH) 1200 MG TB12 Take 1 tablet (1,200 mg total) by mouth every 12 (twelve) hours as needed.  . lansoprazole (PREVACID) 30 MG capsule Take 1 capsule (30 mg total) by mouth 2 (two) times daily before a meal.  . levocetirizine (XYZAL) 5 MG tablet Take 1 tablet (5 mg total) by mouth every evening.  . OXYGEN 2lpm with exertion only  . polyethylene glycol (MIRALAX) packet Take 17 g by mouth daily.  . pravastatin (PRAVACHOL) 40 MG tablet Take 1 tablet (40 mg total) by mouth at bedtime.  . predniSONE  (DELTASONE) 10 MG tablet Take 4x2days,2x2days,1x2days,thenstop  . ranitidine (ZANTAC) 150 MG capsule Take 150 mg by mouth every evening. As needed  . senna (SENOKOT) 8.6 MG TABS tablet Take 2 tablets (17.2 mg total) by mouth at bedtime.  . traMADol (ULTRAM) 50 MG tablet Take 1 tablet (50 mg total) by mouth every 8 (eight) hours as needed. for pain  . [DISCONTINUED] azithromycin (ZITHROMAX) 250 MG tablet Take 1 tablet (250 mg total) by mouth daily.  . [DISCONTINUED] lansoprazole (PREVACID) 30 MG capsule TAKE 1 CAPSULE (30 MG TOTAL) BY MOUTH 2 (TWO) TIMES DAILY BEFORE A MEAL (*MAX 117/90DAYS PER INS*)  . [DISCONTINUED] predniSONE (DELTASONE) 10 MG tablet 4 tab x 2 days, 2 tab x 2 days, 1 tab x 2 days then stop   No facility-administered encounter medications on file as of 04/17/2017.     Allergies (verified) Aspirin and Morphine and related   History: Past Medical History:  Diagnosis Date  . Allergy   . Arthritis   . Cancer [facility])    unsure at this time  . Cancer of lower lobe of right lung ([place])   . COPD (chronic obstructive pulmonary disease) ([place])    per patient's health survey - he put a ?  . Depression   . GERD (gastroesophageal reflux disease)   . Hypertension   . Non-small cell carcinoma of lung, right ([place]) 06/24/2015  . Shortness of breath   . Ulcer  Past Surgical History:  Procedure Laterality Date  . ANTERIOR FUSION LUMBAR SPINE  07/25/2012  . ANTERIOR LAT LUMBAR FUSION Left 07/25/2012   Procedure: ANTERIOR LATERAL LUMBAR FUSION 1 LEVEL;  Surgeon: [name], MD;  Location: [place];  Service: Orthopedics;  Laterality: Left;  Left sided lumbar 3-4 lateral interbody fusion with allograft and instrumentation  . COLONOSCOPY  14   polyps rem  . JOINT REPLACEMENT Right 2010  . NODE DISSECTION Right 05/24/2015   Procedure: NODE DISSECTION;  Surgeon: [name], MD;  Location: [place];  Service: Thoracic;  Laterality: Right;  . SEGMENTECOMY Right 05/24/2015    Procedure: RIGHT LOWER LOBE SUPERIOR SEGMENTECTOMY;  Surgeon: [name], MD;  Location: [place];  Service: Thoracic;  Laterality: Right;  . [place]  2012  . VIDEO ASSISTED THORACOSCOPY (VATS)/WEDGE RESECTION Right 05/24/2015   Procedure: RIGHT VIDEO ASSISTED THORACOSCOPY (VATS)/ RIGHTR LOWER LOBE WEDGE RESECTION;  Surgeon: [name], MD;  Location: [place];  Service: Thoracic;  Laterality: Right;   Family History  Problem Relation Age of Onset  . Cancer Mother        colon  . Heart disease Father   . Diabetes Sister   . Hypertension Sister   . Cancer Sister   . Hypertension Brother   . Heart disease Maternal Grandmother        heart attack   Social History   Socioeconomic History  . Marital status: Married    Spouse name: None  . Number of children: None  . Years of education: None  . Highest education level: High school graduate  Social Needs  . Financial resource strain: Not hard at all  . Food insecurity - worry: Never true  . Food insecurity - inability: Never true  . Transportation needs - medical: No  . Transportation needs - non-medical: No  Occupational History  . Occupation: truck driver-disability    Employer: [employer]  Tobacco Use  . Smoking status: Former Smoker    Packs/day: 1.00    Years: 35.00    Pack years: 35.00    Types: Cigarettes    Last attempt to quit: 09/24/2011    Years since quitting: 5.5  . Smokeless tobacco: Never Used  Substance and Sexual Activity  . Alcohol use: No    Alcohol/week: 0.0 oz    Comment: weekly  . Drug use: No  . Sexual activity: None  Other Topics Concern  . None  Social History Narrative  . None   Tobacco Counseling Counseling given: Not Answered   Clinical Intake:  Pre-visit preparation completed: Yes  Pain : 0-10 Pain Score: 6  Pain Type: Chronic pain Pain Location: Hip(left) Pain Orientation: Left Pain Descriptors / Indicators: Aching Pain Onset: More than a month ago Pain  Frequency: Constant     Nutritional Status: BMI 25 -29 Overweight Nutritional Risks: None Diabetes: No  How often do you need to have someone help you when you read instructions, pamphlets, or other written materials from your doctor or pharmacy?: 1 - Never What is the last grade level you completed in school?: 12th grade  Interpreter Needed?: No  Information entered by :: Andrez Grime, LPN  Activities of Daily Living In your present state of health, do you have any difficulty performing the following activities: 04/17/2017  Hearing? N  Vision? N  Difficulty concentrating or making decisions? N  Walking or climbing stairs? Y  Comment Patient has back pain and hip pain. Patient also has  COPD  Dressing or bathing? N  Doing errands, shopping? N  Preparing Food and eating ? N  Using the Toilet? N  In the past six months, have you accidently leaked urine? N  Do you have problems with loss of bowel control? N  Managing your Medications? N  Managing your Finances? N  Housekeeping or managing your Housekeeping? N  Some recent data might be hidden     Immunizations and Health Maintenance Immunization History  Administered Date(s) Administered  . Influenza, High Dose Seasonal PF 11/21/2016  . Influenza,inj,Quad PF,6+ Mos 01/15/2015  . Influenza-Unspecified 11/02/2015  . Pneumococcal Conjugate-13 04/17/2017  . Pneumococcal Polysaccharide-23 07/26/2012   Health Maintenance Due  Topic Date Due  . COLONOSCOPY  06/21/2015    Patient Care Team: [name], MD as PCP - General (Family Medicine) Phylliss Bob, MD as Attending Physician (Orthopedic Surgery) Carol Ada, MD as Consulting Physician (Gastroenterology) [name], MD as Consulting Physician (Pulmonary Disease) Curt Bears, MD as Consulting Physician (Oncology) [name], MD as Consulting Physician (Cardiothoracic Surgery) Gaynelle Arabian, MD as Consulting Physician (Orthopedic  Surgery)  Indicate any recent Medical Services you may have received from other than [facility] providers in the past year (date may be approximate).    Assessment:   This is a routine wellness examination for Kristoff.  Hearing/Vision screen Hearing Screening Comments: Patient is unsure if he had a hearing test completed in the past Vision Screening Comments: Patient has not seen an eye doctor recently. Plans to schedule appointment soon  Dietary issues and exercise activities discussed: Current Exercise Habits: Home exercise routine, Type of exercise: walking, Time (Minutes): 30, Frequency (Times/Week): 4, Weekly Exercise (Minutes/Week): 120, Exercise limited by: None identified  Goals    . Weight (lb) < 170 lb (77.1 kg)     Patient states that he wants to try to lose some weight and get to around 170 lbs in the future.       Depression Screen PHQ 2/9 Scores 04/17/2017 02/19/2017 11/11/2015 11/09/2015  PHQ - 2 Score 2 1 1 2   PHQ- 9 Score 4 4 2 6     Fall Risk Fall Risk  04/17/2017 02/19/2017 11/11/2015 08/02/2015 06/24/2015  Falls in the past year? No No No No No    Is the patient's home free of loose throw rugs in walkways, pet beds, electrical cords, etc?   yes      Grab bars in the bathroom? no      Handrails on the stairs?   yes      Adequate lighting?   no  Timed Get Up and Go performed: yes, completed within 30 seconds   Cognitive Function:     6CIT Screen 04/17/2017  What Year? 0 points  What month? 0 points  What time? 0 points  Count back from 20 0 points  Months in reverse 0 points  Repeat phrase 0 points  Total Score 0    Screening Tests Health Maintenance  Topic Date Due  . COLONOSCOPY  06/21/2015  . PNA vac Low Risk Adult (2 of 2 - PPSV23) 04/18/2018  . TETANUS/TDAP  02/07/2020  . INFLUENZA VACCINE  Completed  . Hepatitis C Screening  Completed    Qualifies for Shingles Vaccine? Advised patient to check with his pharmacy about receiving the Shingrix  vaccine  Cancer Screenings: Lung: Low Dose CT Chest recommended if Age 27-80 years, 30 pack-year currently smoking OR have quit w/in 15years. Patient does not qualify. Colorectal: referral sent to  [facility] for screening colonoscopy  Additional Screenings:  Hepatitis B/HIV/Syphillis: not indicated Hepatitis C Screening: completed 04/15/2015      Plan:   I have personally reviewed and noted the following in the patient's chart:   . Medical and social history . Use of alcohol, tobacco or illicit drugs  . Current medications and supplements . Functional ability and status . Nutritional status . Physical activity . Advanced directives . List of other physicians . Hospitalizations, surgeries, and ER visits in previous 12 months . Vitals . Screenings to include cognitive, depression, and falls . Referrals and appointments  In addition, I have reviewed and discussed with patient certain preventive protocols, quality metrics, and best practice recommendations. A written personalized care plan for preventive services as well as general preventive health recommendations were provided to patient.   1. Special screening for malignant neoplasms, colon - Ambulatory referral to Gastroenterology (for Colonoscopy)  2. Encounter for Medicare annual wellness exam  3. Need for vaccination with 13-polyvalent pneumococcal conjugate vaccine - PCV 13 (Prevnar)   Andrez Grime, [place]   5/85/2778

## 2017-04-19 ENCOUNTER — Ambulatory Visit (INDEPENDENT_AMBULATORY_CARE_PROVIDER_SITE_OTHER): Payer: Medicare Other | Admitting: Family Medicine

## 2017-04-19 ENCOUNTER — Ambulatory Visit (INDEPENDENT_AMBULATORY_CARE_PROVIDER_SITE_OTHER): Payer: Medicare Other | Admitting: Internal Medicine

## 2017-04-19 ENCOUNTER — Ambulatory Visit: Payer: Medicare Other | Admitting: Family Medicine

## 2017-04-19 VITALS — BP 122/78 | HR 86 | Ht 69.0 in | Wt 179.0 lb

## 2017-04-19 DIAGNOSIS — M25551 Pain in right hip: Secondary | ICD-10-CM | POA: Diagnosis not present

## 2017-04-19 DIAGNOSIS — M25512 Pain in left shoulder: Secondary | ICD-10-CM | POA: Diagnosis not present

## 2017-04-19 DIAGNOSIS — J449 Chronic obstructive pulmonary disease, unspecified: Secondary | ICD-10-CM

## 2017-04-19 DIAGNOSIS — R6889 Other general symptoms and signs: Secondary | ICD-10-CM | POA: Diagnosis not present

## 2017-04-19 DIAGNOSIS — M25562 Pain in left knee: Secondary | ICD-10-CM | POA: Diagnosis not present

## 2017-04-19 DIAGNOSIS — I10 Essential (primary) hypertension: Secondary | ICD-10-CM | POA: Diagnosis not present

## 2017-04-19 DIAGNOSIS — G8929 Other chronic pain: Secondary | ICD-10-CM

## 2017-04-19 DIAGNOSIS — M25552 Pain in left hip: Secondary | ICD-10-CM

## 2017-04-19 DIAGNOSIS — G894 Chronic pain syndrome: Secondary | ICD-10-CM

## 2017-04-19 DIAGNOSIS — J9611 Chronic respiratory failure with hypoxia: Secondary | ICD-10-CM

## 2017-04-19 DIAGNOSIS — M25511 Pain in right shoulder: Secondary | ICD-10-CM

## 2017-04-19 DIAGNOSIS — G629 Polyneuropathy, unspecified: Secondary | ICD-10-CM | POA: Diagnosis not present

## 2017-04-19 DIAGNOSIS — M25561 Pain in right knee: Secondary | ICD-10-CM

## 2017-04-19 MED ORDER — UMECLIDINIUM-VILANTEROL 62.5-25 MCG/INH IN AEPB: 1.0000 | INHALATION_SPRAY | Freq: Every day | RESPIRATORY_TRACT | 0 refills | Status: DC

## 2017-04-19 MED ORDER — UMECLIDINIUM-VILANTEROL 62.5-25 MCG/INH IN AEPB: 1.0000 | INHALATION_SPRAY | Freq: Every day | RESPIRATORY_TRACT | 11 refills | Status: DC

## 2017-04-19 NOTE — Progress Notes (Signed)
Subjective:    Patient ID: Elijah Marshall, male    DOB: 1950-04-18  MRN: [id number]   Brief patient profile:  67  yobm quit smoking 2013  and able work out at [employer] and did fine until hurt back 2011 then 2 back surgeries and knee surgery  and more noticeable doe  since then but also limited by back and s/p wedge resection of RLL NSC lung ca Ia 05/2015  referred to pulmonary clinic 09/05/2013 for ? Copd  With GOLD II criteria 10/29/13 and confirmed 01/10/2016     History of Present Illness  09/05/2013 1st [facility] office visit/ Elijah Marshall  Chief Complaint  Patient presents with  . Pulmonary Consult    Referred per Dr. Norberto Sorenson.  Pt c/o SOB for the past 3 yrs. He states that he was dxed with COPD back in 2012 or 2013.  He states that that he sometimes has trouble with breathing when walking up stairs and lifting things.    rx spiriva first, alb, dulera not as effective  No problem at rest or supine Assoc nasal congestion much better p shot (?depomedrol) and bad again x sev years corresponds to worse doe  Ex = walking one mile slower pace than wife,trouble with hills, worse in heat  Bad hb even on dexilant  Min dry cough  rec Stop spiriva  Start anoro two puffs off one click each am Zantac 150 mg one at  Bedtime Prednisone 10 mg take  4 each am x 2 days,   2 each am x 2 days,  1 each am x 2 days and stop  GERD diet        01/10/2016  f/u ov/Elijah Marshall re: GOLD II/ symb 160 2bid  Chief Complaint  Patient presents with  . Follow-up    PFT today. Pt states his ears and nose are stuffy. He states his breathing is overall doing well and he is not coughing much.  He is using albuterol inhaler 2 x per wk on average.   MMRC2 = can't walk a nl pace on a flat grade s sob but does fine slow and flat eg shopping / can't do step  rec Plan A = Automatic = Symbicort 160 Take 2 puffs first thing in am and then another 2 puffs about 12 hours later.  Work on Chemical engineer technique:  Plan B =  Backup Only use your albuterol (proair) as a rescue medication          02/21/2017  f/u ov/Elijah Marshall re:   GOLD II copd  Chief Complaint  Patient presents with  . Follow-up    He is noticing more DOE with exertion such as walking up stairs. He is having trouble producing sputum, and when he does it is yellow. He is using his albuterol inhaler 2 x per wk on average.   x one month more sob than usual with activity with baseline = MMRC2 = can't walk a nl pace on a flat grade s sob but does fine slow and flat with sats as low as 89%/ never tries albuterol to see if improves/only using 02 with exercise and not with exertion (very limited insight despite already completing rehab  Worse x one month Sleeping fine s am flare of cough /congestion  Normally able to walk outdoors 25 -30 min 3 x week with 02 2lpm  And last week had to stop p 15 min rec Change to bevespi    04/19/2017  f/u  ov/Elijah Marshall re:  GOLD II copd /  Chief Complaint  Patient presents with  . Follow-up    Patient was told to follow up with formulary in hand to discuss medications.   Dyspnea:   improved with walking back up  to 30 min on 2lpm  Cough: none Sleep: no resp problems SABA use:  No change on bevespi vs symbicort / concerned with cost issues   No obvious day to day or daytime variability or assoc excess/ purulent sputum or mucus plugs or hemoptysis or cp or chest tightness, subjective wheeze or overt sinus or hb symptoms. No unusual exposure hx or h/o childhood pna/ asthma or knowledge of premature birth.  Sleeping ok flat without nocturnal  or early am exacerbation  of respiratory  c/o's or need for noct saba. Also denies any obvious fluctuation of symptoms with weather or environmental changes or other aggravating or alleviating factors except as outlined above   Current Allergies, Complete Past Medical History, Past Surgical History, Family History, and Social History were reviewed in [employer]  record.  ROS  The following are not active complaints unless bolded Hoarseness, sore throat, dysphagia, dental problems, itching, sneezing,  nasal congestion or discharge of excess mucus or purulent secretions, ear ache,   fever, chills, sweats, unintended wt loss or wt gain, classically pleuritic or exertional cp,  orthopnea pnd or leg swelling, presyncope, palpitations, abdominal pain, anorexia, nausea, vomiting, diarrhea  or change in bowel habits or change in bladder habits, change in stools or change in urine, dysuria, hematuria,  rash, arthralgias, visual complaints, headache, numbness, weakness or ataxia or problems with walking or coordination,  change in mood/affect or memory.        Current Meds  Medication Sig  . albuterol (PROAIR HFA) 108 (90 Base) MCG/ACT inhaler 2 puffs every 4 hours as needed only  if your can't catch your breath  . bisoprolol-hydrochlorothiazide (ZIAC) 2.5-6.25 MG tablet Take 1 tablet by mouth daily.  . clonazePAM (KLONOPIN) 1 MG tablet Take 1/2 tablet as needed twice a day for anxiety and 1-2 tablets at night as needed for sleep  . Glycopyrrolate-Formoterol (BEVESPI AEROSPHERE) 9-4.8 MCG/ACT AERO Inhale 2 puffs into the lungs 2 (two) times daily.  . Guaifenesin (MUCINEX MAXIMUM STRENGTH) 1200 MG TB12 Take 1 tablet (1,200 mg total) by mouth every 12 (twelve) hours as needed.  Marland Kitchen levocetirizine (XYZAL) 5 MG tablet Take 1 tablet (5 mg total) by mouth every evening.  . OXYGEN 2lpm with exertion only  . polyethylene glycol (MIRALAX) packet Take 17 g by mouth daily.  . pravastatin (PRAVACHOL) 40 MG tablet Take 1 tablet (40 mg total) by mouth at bedtime.  . ranitidine (ZANTAC) 150 MG capsule Take 150 mg by mouth every evening. As needed  . senna (SENOKOT) 8.6 MG TABS tablet Take 2 tablets (17.2 mg total) by mouth at bedtime.  . traMADol (ULTRAM) 50 MG tablet Take 1 tablet (50 mg total) by mouth every 8 (eight) hours as needed. for pain               Objective:    Physical Exam  amb bm nad    04/19/2017        179      07/31/2016        182  05/16/2016        181  04/28/2016        185  01/10/2016        179    11/19/15  182 lb (82.6 kg)  11/11/15 180 lb (81.6 kg)  11/04/15 181 lb 3.5 oz (82.2 kg)      Vital signs reviewed - Note on arrival 02 sats  97% on RA           HEENT: nl dentition, turbinates bilaterally, and oropharynx. Nl external ear canals without cough reflex   NECK :  without JVD/Nodes/TM/ nl carotid upstrokes bilaterally   LUNGS: no acc muscle use,  Slt barrel  contour chest with distant bs  bilaterally without cough on insp or exp maneuvers   CV:  RRR  no s3 or murmur or increase in P2, and no edema   ABD:  soft and nontender with Pos mid inspiratory Hoover's sign  in the supine position. No bruits or organomegaly appreciated, bowel sounds nl  MS:  Nl gait/ ext warm without deformities, calf tenderness, cyanosis or clubbing No obvious joint restrictions   SKIN: warm and dry without lesions    NEURO:  alert, approp, nl sensorium with  no motor or cerebellar deficits apparent.       I personally reviewed images and agree with radiology impression as follows:   Chest CT  12/21/16 1. Postsurgical and post treatment changes in the right hemithorax without evidence of recurrent or metastatic disease. 2.  Aortic atherosclerosis (ICD10-170.0). 3.  Emphysema (ICD10-J43.9).

## 2017-04-19 NOTE — Patient Instructions (Addendum)
Plan A = Automatic = Bevespi 2 every 12 hours or ANORO one click each am  But pick the one that works the best for you and if they are both the same then chose the less costly     Plan B = Backup Only use your albuterol as a rescue medication to be used if you can't catch your breath by resting or doing a relaxed purse lip breathing pattern.  - The less you use it, the better it will work when you need it. - Ok to use the inhaler up to 2 puffs  every 4 hours if you must but call for appointment if use goes up over your usual need - Don't leave home without it !!  (think of it like the spare tire for your car)   Keep appt already scheduled - call sooner if needed

## 2017-04-20 ENCOUNTER — Ambulatory Visit: Payer: Medicare Other | Admitting: Internal Medicine

## 2017-04-21 LAB — LIPID PANEL
Chol/HDL Ratio: 3.7 ratio (ref 0.0–5.0)
Cholesterol, Total: 186 mg/dL (ref 100–199)
HDL: 50 mg/dL (ref >39)
LDL Calculated: 115 mg/dL — ABNORMAL HIGH (ref 0–99)
Triglycerides: 104 mg/dL (ref 0–149)
VLDL Cholesterol Cal: 21 mg/dL (ref 5–40)

## 2017-04-21 LAB — TSH: TSH: 1.48 u[IU]/mL (ref 0.450–4.500)

## 2017-04-21 LAB — VITAMIN B12

## 2017-04-22 ENCOUNTER — Encounter: Payer: Self-pay | Admitting: Internal Medicine

## 2017-04-22 NOTE — Assessment & Plan Note (Signed)
SATURATION QUALIFICATIONS:  11/19/2015  Patient Saturations on Room Air at Rest = 96% Patient Saturations on Room Air while Ambulating = 86% Patient Saturations on 2  Liters of oxygen while Ambulating = 96% - 05/16/2016 :  Patient Saturations on Room Air at Rest = 88%--- increased to 99% on 2lpm continuous o2//lmr   04/19/2017  rec 2lpm with activity - he is using it and helps with ex tol

## 2017-04-22 NOTE — Progress Notes (Signed)
Lab only. Not seen  By provider.

## 2017-04-22 NOTE — Assessment & Plan Note (Signed)
-   Spirometry  03/01/12  FEV1  1.63 - Spirometry   09/05/13 FEV1  1.34 (46%) ratio 44  - PFTs   10/29/2013    FEV1    1.70 (62%) ratio 50% no better after ssaba and DLCO 48%  - 09/05/2013  Walked RA x 3 laps @ fast pace @  185 ft each stopped due to end of study, fast pace,sat 89% at end  - 01/06/14 gradual worsening on anoro plus increase need for saba  - 01/28/2014   > try spiriva respimat/ symbicort 160 2bid > changed to symbicort 160 2bid only on 01/28/14  - 02/25/2014   90%   > changed to symbicort 80 2bid since hoarse on 160 and clear on exam  - 11/19/2015 rechallenge with symbicort 160 Take 2 puffs first thing in am and then another 2 puffs about 12 hours later.  - PFT's  01/10/2016  FEV1 1.38 (51 % ) ratio 47  p 1 % improvement from saba p symb 160  prior to study with DLCO  43/41  % corrects to 47  % for alv volume   - 02/21/2017  After extensive coaching inhaler device  effectiveness =    90% vs baseline < 25%  - 02/21/2017 changed to bevespi 2 bid  - 04/19/2017  Demonstrated elipta > try anoro to see if cost less/ works as well    Pt is Group B in terms of symptom/risk and laba/lama therefore appropriate rx at this point.    Formulary restrictions will be an ongoing challenge for the forseable future and I would be happy to pick an alternative if the pt will first  provide me a list of them but pt  will need to return here for training for any new device that is required eg dpi vs hfa vs respimat.    In meantime we can always provide samples so the patient never runs out of any needed respiratory medications.    If can't afford either bevesopi or anoro on his plan can try stiolto but best to return with formulary first to review other options as I'm not convinced he wasn't doing just as well on laba/ics   I had an extended discussion with the patient reviewing all relevant studies completed to date and  lasting 15 to 20 minutes of a 25 minute visit    Each maintenance medication was  reviewed in detail including most importantly the difference between maintenance and prns and under what circumstances the prns are to be triggered using an action plan format that is not reflected in the computer generated alphabetically organized AVS.    Please see AVS for specific instructions unique to this visit that I personally wrote and verbalized to the the pt in detail and then reviewed with pt  by my nurse highlighting any  changes in therapy recommended at today's visit to their plan of care.

## 2017-04-23 DIAGNOSIS — G629 Polyneuropathy, unspecified: Secondary | ICD-10-CM | POA: Diagnosis not present

## 2017-04-23 DIAGNOSIS — M25512 Pain in left shoulder: Secondary | ICD-10-CM | POA: Diagnosis not present

## 2017-04-23 DIAGNOSIS — M25551 Pain in right hip: Secondary | ICD-10-CM | POA: Diagnosis not present

## 2017-04-23 DIAGNOSIS — G8929 Other chronic pain: Secondary | ICD-10-CM | POA: Diagnosis not present

## 2017-04-23 DIAGNOSIS — M25561 Pain in right knee: Secondary | ICD-10-CM | POA: Diagnosis not present

## 2017-04-23 DIAGNOSIS — M25511 Pain in right shoulder: Secondary | ICD-10-CM | POA: Diagnosis not present

## 2017-04-23 DIAGNOSIS — I10 Essential (primary) hypertension: Secondary | ICD-10-CM | POA: Diagnosis not present

## 2017-04-23 DIAGNOSIS — M25552 Pain in left hip: Secondary | ICD-10-CM | POA: Diagnosis not present

## 2017-04-23 DIAGNOSIS — M25562 Pain in left knee: Secondary | ICD-10-CM | POA: Diagnosis not present

## 2017-04-23 DIAGNOSIS — R6889 Other general symptoms and signs: Secondary | ICD-10-CM | POA: Diagnosis not present

## 2017-04-23 DIAGNOSIS — G894 Chronic pain syndrome: Secondary | ICD-10-CM | POA: Diagnosis not present

## 2017-04-23 NOTE — Addendum Note (Signed)
Addended by: Gari Crown D on: 04/23/2017 09:30 AM   Modules accepted: Orders

## 2017-04-24 LAB — VITAMIN B12: Vitamin B-12: 446 pg/mL (ref 232–1245)

## 2017-05-07 ENCOUNTER — Encounter: Payer: Self-pay | Admitting: *Deleted

## 2017-05-07 ENCOUNTER — Encounter: Payer: Self-pay | Admitting: Family Medicine

## 2017-05-07 MED ORDER — PRAVASTATIN SODIUM 40 MG PO TABS: 40.0000 mg | ORAL_TABLET | Freq: Every day | ORAL | 3 refills | Status: DC

## 2017-05-07 NOTE — Addendum Note (Signed)
Addended by: [name] on: 05/07/2017 03:23 AM   Modules accepted: Orders

## 2017-05-16 ENCOUNTER — Other Ambulatory Visit: Payer: Self-pay

## 2017-05-16 NOTE — Telephone Encounter (Signed)
[facility] fax req for refill Tramadol - sent to Dr. Brigitte Pulse

## 2017-05-18 ENCOUNTER — Other Ambulatory Visit: Payer: Self-pay | Admitting: Internal Medicine

## 2017-05-20 MED ORDER — TRAMADOL HCL 50 MG PO TABS: 50.0000 mg | ORAL_TABLET | Freq: Three times a day (TID) | ORAL | 2 refills | Status: DC | PRN

## 2017-05-20 NOTE — Telephone Encounter (Signed)
Today I have utilized the  Controlled Substance Registry's online query to confirm compliance regarding the patient's controlled medications. My review reveals appropriate prescription fills and that Dr. Wynelle Link has been sole provider except for rx from me in Jan where pharm only filled #21 for acute pain. Rechecks will occur regularly and the patient is aware of our use of the system. Is transfering rx of tramadol from Dr. Wynelle Link to myself since will stop following with A for now if he decides not to proceed with surgery

## 2017-05-21 ENCOUNTER — Other Ambulatory Visit: Payer: Self-pay | Admitting: Family Medicine

## 2017-05-21 ENCOUNTER — Encounter: Payer: Self-pay | Admitting: Family Medicine

## 2017-05-22 ENCOUNTER — Encounter: Payer: Self-pay | Admitting: Thoracic Surgery (Cardiothoracic Vascular Surgery)

## 2017-05-22 ENCOUNTER — Ambulatory Visit (INDEPENDENT_AMBULATORY_CARE_PROVIDER_SITE_OTHER): Payer: Medicare Other | Admitting: Thoracic Surgery (Cardiothoracic Vascular Surgery)

## 2017-05-22 ENCOUNTER — Other Ambulatory Visit: Payer: Self-pay | Admitting: *Deleted

## 2017-05-22 ENCOUNTER — Encounter: Payer: Self-pay | Admitting: Internal Medicine

## 2017-05-22 ENCOUNTER — Ambulatory Visit (INDEPENDENT_AMBULATORY_CARE_PROVIDER_SITE_OTHER): Payer: Medicare Other | Admitting: Internal Medicine

## 2017-05-22 ENCOUNTER — Other Ambulatory Visit: Payer: Self-pay

## 2017-05-22 ENCOUNTER — Ambulatory Visit
Admission: RE | Admit: 2017-05-22 | Discharge: 2017-05-22 | Disposition: A | Discharge status: Home / Self Care | Payer: Medicare Other | Source: Ambulatory Visit | Attending: Thoracic Surgery (Cardiothoracic Vascular Surgery) | Admitting: Thoracic Surgery (Cardiothoracic Vascular Surgery)

## 2017-05-22 VITALS — BP 132/85 | HR 85 | Resp 16 | Ht 69.0 in | Wt 179.0 lb

## 2017-05-22 VITALS — BP 122/80 | HR 97 | Ht 67.0 in | Wt 183.0 lb

## 2017-05-22 DIAGNOSIS — Z902 Acquired absence of lung [part of]: Secondary | ICD-10-CM

## 2017-05-22 DIAGNOSIS — J449 Chronic obstructive pulmonary disease, unspecified: Secondary | ICD-10-CM | POA: Diagnosis not present

## 2017-05-22 DIAGNOSIS — C3491 Malignant neoplasm of unspecified part of right bronchus or lung: Secondary | ICD-10-CM

## 2017-05-22 DIAGNOSIS — J9611 Chronic respiratory failure with hypoxia: Secondary | ICD-10-CM | POA: Diagnosis not present

## 2017-05-22 DIAGNOSIS — Z09 Encounter for follow-up examination after completed treatment for conditions other than malignant neoplasm: Secondary | ICD-10-CM

## 2017-05-22 LAB — PULMONARY FUNCTION TEST
DL/VA % pred: 49 %
DL/VA: 2.19 ml/min/mmHg/L
DLCO unc % pred: 38 %
DLCO unc: 10.88 ml/min/mmHg
FEF 25-75 Post: 0.52 L/sec
FEF 25-75 Pre: 0.5 L/sec
FEF2575-%Change-Post: 4 %
FEF2575-%Pred-Post: 22 %
FEF2575-%Pred-Pre: 21 %
FEV1-%Change-Post: 1 %
FEV1-%Pred-Post: 46 %
FEV1-%Pred-Pre: 45 %
FEV1-Post: 1.21 L
FEV1-Pre: 1.19 L
FEV1FVC-%Change-Post: -1 %
FEV1FVC-%Pred-Pre: 60 %
FEV6-%Change-Post: 1 %
FEV6-%Pred-Post: 77 %
FEV6-%Pred-Pre: 76 %
FEV6-Post: 2.56 L
FEV6-Pre: 2.51 L
FEV6FVC-%Change-Post: -1 %
FEV6FVC-%Pred-Post: 101 %
FEV6FVC-%Pred-Pre: 102 %
FVC-%Change-Post: 3 %
FVC-%Pred-Post: 76 %
FVC-%Pred-Pre: 74 %
FVC-Post: 2.66 L
FVC-Pre: 2.57 L
Post FEV1/FVC ratio: 46 %
Post FEV6/FVC ratio: 96 %
Pre FEV1/FVC ratio: 46 %
Pre FEV6/FVC Ratio: 98 %
RV % pred: 162 %
RV: 3.6 L
TLC % pred: 106 %
TLC: 6.83 L

## 2017-05-22 NOTE — Patient Instructions (Addendum)
Wear [id number] with activity that makes you short of breath  Only use your albuterol as a rescue medication to be used if you can't catch your breath by resting or doing a relaxed purse lip breathing pattern.  - The less you use it, the better it will work when you need it. - Ok to use up to 2 puffs  every 4 hours if you must but call for immediate appointment if use goes up over your usual need - Don't leave home without it !!  (think of it like the spare tire for your car)    Please schedule a follow up visit in 3 months but call sooner if needed

## 2017-05-22 NOTE — Progress Notes (Signed)
PFT completed today.  

## 2017-05-22 NOTE — Progress Notes (Signed)
Subjective:    Patient ID: Elijah Marshall, male    DOB: 1950/06/26  MRN: [id number]   Brief patient profile:  67  yobm quit smoking 2013  and able work out at [employer] and did fine until hurt back 2011 then 2 back surgeries and knee surgery  and more noticeable doe  since then but also limited by back and s/p sup segmentectomy RLL NSC lung ca Ia 05/2015  referred to pulmonary clinic 09/05/2013 for ? Copd  With GOLD II criteria 10/29/13 and confirmed 01/10/2016     History of Present Illness  09/05/2013 1st [facility] office visit/ Elijah Marshall  Chief Complaint  Patient presents with  . Pulmonary Consult    Referred per Dr. Norberto Sorenson.  Pt c/o SOB for the past 3 yrs. He states that he was dxed with COPD back in 2012 or 2013.  He states that that he sometimes has trouble with breathing when walking up stairs and lifting things.    rx spiriva first, alb, dulera not as effective  No problem at rest or supine Assoc nasal congestion much better p shot (?depomedrol) and bad again x sev years corresponds to worse doe  Ex = walking one mile slower pace than wife,trouble with hills, worse in heat  Bad hb even on dexilant  Min dry cough  rec Stop spiriva  Start anoro two puffs off one click each am Zantac 150 mg one at  Bedtime Prednisone 10 mg take  4 each am x 2 days,   2 each am x 2 days,  1 each am x 2 days and stop  GERD diet    04/19/2017  f/u ov/Elijah Marshall re:  GOLD II copd /  Chief Complaint  Patient presents with  . Follow-up    Patient was told to follow up with formulary in hand to discuss medications.   Dyspnea:   improved with walking back up  to 30 min on 2lpm  Cough: none Sleep: no resp problems SABA use:  No change on bevespi vs symbicort / concerned with cost issues rec Plan A = Automatic = Bevespi 2 every 12 hours or ANORO one click each am  But pick the one that works the best for you and if they are both the same then chose the less costly  Plan B = Backup Only use your albuterol  as a rescue medication    05/22/2017  f/u ov/Elijah Marshall re: copd gold III/ not using concentrator /  Has portable and uses 2lpm  Chief Complaint  Patient presents with  . Follow-up    PFT done today, reports SOB on exertion.    Dyspnea:  Still walking up 30 min s stopping while on 2lpm  Cough: no Sleep: RA flat  SABA use:  avg rarely but used one hour prior to arrival against advice for pfts today   No obvious day to day or daytime variability or assoc excess/ purulent sputum or mucus plugs or hemoptysis or cp or chest tightness, subjective wheeze or overt sinus or hb symptoms. No unusual exposure hx or h/o childhood pna/ asthma or knowledge of premature birth.  Sleeping  RA flat   without nocturnal  or early am exacerbation  of respiratory  c/o's or need for noct saba. Also denies any obvious fluctuation of symptoms with weather or environmental changes or other aggravating or alleviating factors except as outlined above   Current Allergies, Complete Past Medical History, Past Surgical History, Family History, and Social  History were reviewed in [facility] electronic medical record.  ROS  The following are not active complaints unless bolded Hoarseness, sore throat, dysphagia, dental problems, itching, sneezing,  nasal congestion or discharge of excess mucus or purulent secretions, ear ache,   fever, chills, sweats, unintended wt loss or wt gain, classically pleuritic or exertional cp,  orthopnea pnd or arm/hand swelling  or leg swelling, presyncope, palpitations, abdominal pain, anorexia, nausea, vomiting, diarrhea  or change in bowel habits or change in bladder habits, change in stools or change in urine, dysuria, hematuria,  rash, arthralgias, visual complaints, headache, numbness, weakness or ataxia or problems with walking or coordination,  change in mood or  memory.        Current Meds  Medication Sig  . albuterol (VENTOLIN HFA) 108 (90 Base) MCG/ACT inhaler INHALE USING 2 PUFFS EVERY  4 HOURS AS NEEDED ONLY IF YOUR CAN'T CATCH YOUR BREATH  . bisoprolol-hydrochlorothiazide (ZIAC) 2.5-6.25 MG tablet TAKE 1 TABLET BY MOUTH EVERY DAY  . clonazePAM (KLONOPIN) 1 MG tablet Take 1/2 tablet as needed twice a day for anxiety and 1-2 tablets at night as needed for sleep  . Guaifenesin (MUCINEX MAXIMUM STRENGTH) 1200 MG TB12 Take 1 tablet (1,200 mg total) by mouth every 12 (twelve) hours as needed.  Marland Kitchen levocetirizine (XYZAL) 5 MG tablet Take 1 tablet (5 mg total) by mouth every evening.  . OXYGEN 2lpm with exertion only  . polyethylene glycol (MIRALAX) packet Take 17 g by mouth daily.  . pravastatin (PRAVACHOL) 40 MG tablet Take 1 tablet (40 mg total) by mouth at bedtime.  . ranitidine (ZANTAC) 150 MG capsule Take 150 mg by mouth every evening. As needed  . senna (SENOKOT) 8.6 MG TABS tablet Take 2 tablets (17.2 mg total) by mouth at bedtime.  . traMADol (ULTRAM) 50 MG tablet Take 1-2 tablets (50-100 mg total) by mouth every 8 (eight) hours as needed. for pain  . umeclidinium-vilanterol (ANORO ELLIPTA) 62.5-25 MCG/INH AEPB Inhale 1 puff into the lungs daily.                    Objective:   Physical Exam    05/22/2017        183  04/19/2017        179      07/31/2016        182  05/16/2016        181  04/28/2016        185  01/10/2016        179    11/19/15 182 lb (82.6 kg)  11/11/15 180 lb (81.6 kg)  11/04/15 181 lb 3.5 oz (82.2 kg)    Vital signs reviewed - Note on arrival 02 sats  92% on RA        HEENT: nl dentition / oropharynx. Nl external ear canals without cough reflex - moderate bilateral non-specific turbinate edema     NECK :  without JVD/Nodes/TM/ nl carotid upstrokes bilaterally   LUNGS: no acc muscle use,  Mod barrel  contour chest wall with bilateral  Distant bs s audible wheeze and  without cough on insp or exp maneuver and mod   Hyperresonant  to  percussion bilaterally     CV:  RRR  no s3 or murmur or increase in P2, and no edema   ABD:  soft and  nontender with pos mid insp Hoover's  in the supine position. No bruits or organomegaly appreciated, bowel sounds nl  MS:  Nl gait/  ext warm without deformities, calf tenderness, cyanosis or clubbing No obvious joint restrictions   SKIN: warm and dry without lesions    NEURO:  alert, approp, nl sensorium with  no motor or cerebellar deficits apparent.         I personally reviewed images and agree with radiology impression as follows:   Chest CT  12/21/16 1. Postsurgical and post treatment changes in the right hemithorax without evidence of recurrent or metastatic disease. 2.  Aortic atherosclerosis (ICD10-170.0). 3.  Emphysema (ICD10-J43.9).

## 2017-05-22 NOTE — Assessment & Plan Note (Signed)
-   Spirometry  03/01/12  FEV1  1.63 - Spirometry   09/05/13 FEV1  1.34 (46%) ratio 44  - PFTs   10/29/2013    FEV1    1.70 (62%) ratio 50% no better after ssaba and DLCO 48%  - 09/05/2013  Walked RA x 3 laps @ fast pace @  185 ft each stopped due to end of study, fast pace,sat 89% at end  - 01/06/14 gradual worsening on anoro plus increase need for saba  - 01/28/2014   > try spiriva respimat/ symbicort 160 2bid > changed to symbicort 160 2bid only on 01/28/14  - 02/25/2014   90%   > changed to symbicort 80 2bid since hoarse on 160 and clear on exam  - 11/19/2015 rechallenge with symbicort 160 Take 2 puffs first thing in am and then another 2 puffs about 12 hours later.  - PFT's  01/10/2016  FEV1 1.38 (51 % ) ratio 47  p 1 % improvement from saba p symb 160  prior to study with DLCO  43/41  % corrects to 47  % for alv volume   - 02/21/2017  After extensive coaching inhaler device  effectiveness =    90% vs baseline < 25%  - 02/21/2017 changed to bevespi 2 bid  - 04/19/2017  Demonstrated elipta > try anoro to see if cost less/ works as well   - PFT's  05/22/2017  FEV1 1.21  (46 % ) ratio 46  p 1 % improvement from saba p anoro and albuterol 1 h prior to study with DLCO  38 % corrects to 49 % for alv volume     Pt is Group B in terms of symptom/risk and laba/lama therefore appropriate rx at this point  eg Anoro so ok to continue at this point and work on conditioning wearing 02 appropriate with exertion instead of over relying on saba which he did prior to today's pfts   I had an extended discussion with the patient reviewing all relevant studies completed to date and  lasting 15 to 20 minutes of a 25 minute visit    Each maintenance medication was reviewed in detail including most importantly the difference between maintenance and prns and under what circumstances the prns are to be triggered using an action plan format that is not reflected in the computer generated alphabetically organized AVS.    Please  see AVS for specific instructions unique to this visit that I personally wrote and verbalized to the the pt in detail and then reviewed with pt  by my nurse highlighting any  changes in therapy recommended at today's visit to their plan of care.

## 2017-05-22 NOTE — Assessment & Plan Note (Signed)
SATURATION QUALIFICATIONS:  11/19/2015  Patient Saturations on Room Air at Rest = 96% Patient Saturations on Room Air while Ambulating = 86% Patient Saturations on 2  Liters of oxygen while Ambulating = 96% - 05/16/2016 :  Patient Saturations on Room Air at Rest = 88%--- increased to 99% on 2lpm continuous o2//lmr   On 02 2lpm able to walk up to 30 min but needs to wear it with other times with exertion since feels it relieves his sob so well.

## 2017-05-22 NOTE — Progress Notes (Signed)
[address]       [place] [address]             [phone]     HPI: Mr. Shake returns for a scheduled follow-up visit.  Mr Tierce is a 67 year old with a past history of tobacco abuse, COPD, reflux, hypertension, and arthritis.  I did a thoracoscopic right lower lobe superior segmentectomy for a stage IA non-small cell carcinoma in April 2017.  He went home on 2 L oxygen nasal cannula.  He currently is only using oxygen when he is exerting himself.  He has been followed by Dr. Julien Nordmann.  Is scheduled to see him again in November.  Dr. Melvyn Novas follows him from a pulmonary perspective.  He is scheduled to have a 6-minute walk test in the near future.  He feels well.  He does not have any residual pain.  His respiratory status is stable.  Appetite is good.  He has not had any significant weight loss.  Past Medical History:  Diagnosis Date  . Allergy   . Arthritis   . BPH with obstruction/lower urinary tract symptoms 2016   Nocturia  . Cancer [facility])    unsure at this time  . Cancer of lower lobe of right lung ([place])   . COPD (chronic obstructive pulmonary disease) ([place])    per patient's health survey - he put a ?  . Depression   . Erectile dysfunction due to arterial insufficiency   . GERD (gastroesophageal reflux disease)   . Hyperlipidemia   . Hypertension   . Non-small cell carcinoma of lung, right ([place]) 06/24/2015  . Shortness of breath   . Ulcer     Current Outpatient Medications  Medication Sig Dispense Refill  . albuterol (VENTOLIN HFA) 108 (90 Base) MCG/ACT inhaler INHALE USING 2 PUFFS EVERY 4 HOURS AS NEEDED ONLY IF YOUR CAN'T CATCH YOUR BREATH 18 Inhaler 0  . bisoprolol-hydrochlorothiazide (ZIAC) 2.5-6.25 MG tablet TAKE 1 TABLET BY MOUTH EVERY DAY 30 tablet 1  . clonazePAM (KLONOPIN) 1 MG tablet Take 1/2 tablet as needed twice a day for anxiety and 1-2 tablets at night as needed for sleep 60 tablet 1  . Glycopyrrolate-Formoterol (BEVESPI  AEROSPHERE) 9-4.8 MCG/ACT AERO Inhale 2 puffs into the lungs 2 (two) times daily. 1 Inhaler 0  . Guaifenesin (MUCINEX MAXIMUM STRENGTH) 1200 MG TB12 Take 1 tablet (1,200 mg total) by mouth every 12 (twelve) hours as needed. 14 tablet 1  . levocetirizine (XYZAL) 5 MG tablet Take 1 tablet (5 mg total) by mouth every evening. 90 tablet 3  . OXYGEN 2lpm with exertion only    . polyethylene glycol (MIRALAX) packet Take 17 g by mouth daily.    . pravastatin (PRAVACHOL) 40 MG tablet Take 1 tablet (40 mg total) by mouth at bedtime. 90 tablet 3  . ranitidine (ZANTAC) 150 MG capsule Take 150 mg by mouth every evening. As needed    . senna (SENOKOT) 8.6 MG TABS tablet Take 2 tablets (17.2 mg total) by mouth at bedtime. 120 each 0  . traMADol (ULTRAM) 50 MG tablet Take 1-2 tablets (50-100 mg total) by mouth every 8 (eight) hours as needed. for pain 90 tablet 2  . umeclidinium-vilanterol (ANORO ELLIPTA) 62.5-25 MCG/INH AEPB Inhale 1 puff into the lungs daily. 30 each 11   No current facility-administered medications for this visit.     Physical Exam BP 132/85 (BP Location: Right Arm, Patient Position: Sitting, Cuff Size: Large)  Pulse 85   Resp 16   Ht 5\' 9"  (1.753 m)   Wt 179 lb (81.2 kg)   SpO2 94% Comment: ON RA  BMI 26.43 kg/m ' 67 year old male no acute distress Well-developed well-nourished Alert and oriented x3 with no focal neurologic deficits Cervical or subclavicular adenopathy Neck regular rate and rhythm Lungs equal breath sounds bilaterally, no rales or wheezing  Diagnostic Tests: CHEST - 2 VIEW  COMPARISON:  12/25/2016  FINDINGS: There is hyperinflation of the lungs compatible with COPD. Postoperative scarring in the right lower lung. No confluent opacities or effusions. No acute bony abnormality.  IMPRESSION: COPD. Postoperative scarring in the right lower lung. No active disease.   Electronically Signed   By: Rolm Baptise M.D.   On: 05/22/2017 09:52 I  personally reviewed the chest x-ray images and concur with the findings noted above  Impression: Mr. Maring is a 67 year old with a history of tobacco abuse (quit 3 years ago) who had a right lower lobe superior segmentectomy for stage IA non-small cell carcinoma 2 years ago.  He had borderline pulmonary function prior to surgery.  COPD-followed by Dr. Melvyn Novas.  Symptoms fluctuate to some degree but relatively stable at present  Stage IA non-small cell carcinoma-followed by Dr. Julien Nordmann.  Now 2 years out with no evidence of recurrent disease.  I did inform him that most recurrences do occur within the first 2 years but he needs continued follow-up after 5 years.  He sees Dr. Earlie Server again in November.  Since he continues to see both Dr. Melvyn Novas and Dr. Julien Nordmann there is relatively little need for him to see me.  I am happy to see him again anytime if I can be of any further assistance with his care    [name], MD [facility] [phone]

## 2017-06-04 ENCOUNTER — Emergency Department (HOSPITAL_COMMUNITY): Payer: Medicare Other

## 2017-06-04 ENCOUNTER — Encounter (HOSPITAL_COMMUNITY): Payer: Self-pay | Admitting: Emergency Medicine

## 2017-06-04 ENCOUNTER — Other Ambulatory Visit: Payer: Self-pay

## 2017-06-04 ENCOUNTER — Emergency Department (HOSPITAL_COMMUNITY)
Admission: EM | Admit: 2017-06-04 | Discharge: 2017-06-04 | Disposition: A | Discharge status: Home / Self Care | Payer: Medicare Other | Attending: Emergency Medicine | Admitting: Emergency Medicine

## 2017-06-04 DIAGNOSIS — J449 Chronic obstructive pulmonary disease, unspecified: Secondary | ICD-10-CM | POA: Insufficient documentation

## 2017-06-04 DIAGNOSIS — R251 Tremor, unspecified: Secondary | ICD-10-CM | POA: Diagnosis present

## 2017-06-04 DIAGNOSIS — M62838 Other muscle spasm: Secondary | ICD-10-CM | POA: Diagnosis not present

## 2017-06-04 DIAGNOSIS — I10 Essential (primary) hypertension: Secondary | ICD-10-CM | POA: Insufficient documentation

## 2017-06-04 DIAGNOSIS — Z87891 Personal history of nicotine dependence: Secondary | ICD-10-CM | POA: Insufficient documentation

## 2017-06-04 DIAGNOSIS — R531 Weakness: Secondary | ICD-10-CM | POA: Diagnosis not present

## 2017-06-04 DIAGNOSIS — S0990XA Unspecified injury of head, initial encounter: Secondary | ICD-10-CM | POA: Diagnosis not present

## 2017-06-04 LAB — CBC WITH DIFFERENTIAL/PLATELET
Basophils Absolute: 0 10*3/uL (ref 0.0–0.1)
Basophils Relative: 0 %
Eosinophils Absolute: 0.1 10*3/uL (ref 0.0–0.7)
Eosinophils Relative: 1 %
HCT: 51.8 % (ref 39.0–52.0)
Hemoglobin: 17.3 g/dL — ABNORMAL HIGH (ref 13.0–17.0)
Lymphocytes Relative: 36 %
Lymphs Abs: 2 10*3/uL (ref 0.7–4.0)
MCH: 29.2 pg (ref 26.0–34.0)
MCHC: 33.4 g/dL (ref 30.0–36.0)
MCV: 87.4 fL (ref 78.0–100.0)
Monocytes Absolute: 0.5 10*3/uL (ref 0.1–1.0)
Monocytes Relative: 9 %
Neutro Abs: 3 10*3/uL (ref 1.7–7.7)
Neutrophils Relative %: 54 %
Platelets: 255 10*3/uL (ref 150–400)
RBC: 5.93 MIL/uL — ABNORMAL HIGH (ref 4.22–5.81)
RDW: 13.6 % (ref 11.5–15.5)
WBC: 5.6 10*3/uL (ref 4.0–10.5)

## 2017-06-04 LAB — BASIC METABOLIC PANEL
Anion gap: 11 (ref 5–15)
BUN: 5 mg/dL — ABNORMAL LOW (ref 6–20)
CO2: 25 mmol/L (ref 22–32)
Calcium: 9.7 mg/dL (ref 8.9–10.3)
Chloride: 103 mmol/L (ref 101–111)
Creatinine, Ser: 1.08 mg/dL (ref 0.61–1.24)
GFR calc Af Amer: >60 mL/min (ref >60)
GFR calc non Af Amer: >60 mL/min (ref >60)
Glucose, Bld: 99 mg/dL (ref 65–99)
Potassium: 4.2 mmol/L (ref 3.5–5.1)
Sodium: 139 mmol/L (ref 135–145)

## 2017-06-04 LAB — MAGNESIUM: Magnesium: 2 mg/dL (ref 1.7–2.4)

## 2017-06-04 MED ORDER — METHOCARBAMOL 500 MG PO TABS: 500.0000 mg | ORAL_TABLET | Freq: Two times a day (BID) | ORAL | 0 refills | Status: DC

## 2017-06-04 NOTE — ED Notes (Signed)
Pt is alert and oriented x 4 and is verbally responsive. Pt is escorted with spouse. Pt denies neck pain at this time.

## 2017-06-04 NOTE — ED Triage Notes (Signed)
Patient here from home with complaints of involuntary muscle twitching. Also reports neck pain radiating down into arms and hands. States that during these episodes he cannot pick anything up with hands.

## 2017-06-04 NOTE — Discharge Instructions (Signed)
Return to the ED with any concerns including weakness of arms or legs, changes in vision or speech, fainting, chest pain, difficulty breathing, decreased level of alertness/lethargy, or any other alarming symptoms

## 2017-06-04 NOTE — ED Notes (Signed)
Patient transported to CT 

## 2017-06-04 NOTE — ED Notes (Signed)
Bed: WA09 Expected date:  Expected time:  Means of arrival:  Comments: Pt still in room

## 2017-06-04 NOTE — ED Provider Notes (Signed)
[name] Provider Note   CSN: [id number] Arrival date & time: 06/04/17  1307     History   Chief Complaint Chief Complaint  Patient presents with  . Neck Pain  . Weakness    HPI [facility] is a 67 y.o. male.  HPI  6 patient with history of COPD, hypertension, status post resection of lung cancer presents with complaint of shaking in his upper extremities.  He states that yesterday he began to have what he describes as nervous twitching of his face arms and hands bilaterally.  He felt a sensation of skin crawling on his right neck and into his right shoulder.  He states if he lies down and tries to rest the symptoms return but as long as he sits up and is busy with other things he does not have the symptoms.  He has no weakness or numbness of his extremities.  No headache.  No trauma to his head.  No dehydration or vomiting or diarrhea.  No Elijah changes in medications.  He had another episode of shaking today when he checked his blood pressure and it was elevated and the monitor noted an irregular heart rate.  He is not unconscious during these episodes.  He has no chest pain or shortness of breath.  He does state he has been under a lot of stress.  There are no other associated systemic symptoms, there are no other alleviating or modifying factors.   Past Medical History:  Diagnosis Date  . Allergy   . Arthritis   . BPH with obstruction/lower urinary tract symptoms 2016   Nocturia  . Cancer [facility])    unsure at this time  . Cancer of lower lobe of right lung ([facility])   . COPD (chronic obstructive pulmonary disease) ([facility])    per patient's health survey - he put a ?  . Depression   . Erectile dysfunction due to arterial insufficiency   . GERD (gastroesophageal reflux disease)   . Hyperlipidemia   . Hypertension   . Non-small cell carcinoma of lung, right ([facility]) 06/24/2015  . Shortness of breath   . Ulcer     Patient Active Problem List   Diagnosis Date Noted  . Unexplained night sweats 04/30/2016  . Chronic respiratory failure with hypoxia ([facility]) 11/19/2015  . Arthritis 11/11/2015  . Depression 11/11/2015  . Status post lobectomy of lung 07/05/2015  . Essential hypertension 07/05/2015  . Non-small cell carcinoma of lung, right ([facility]) 06/24/2015  . Hyperlipidemia 04/20/2015  . Chronic infection of sinus 04/20/2015  . GERD (gastroesophageal reflux disease) 06/08/2014  . Chronic rhinitis 10/30/2012  . COPD GOLD III 09/26/2011    Past Surgical History:  Procedure Laterality Date  . ANTERIOR FUSION LUMBAR SPINE  07/25/2012  . ANTERIOR LAT LUMBAR FUSION Left 07/25/2012   Procedure: ANTERIOR LATERAL LUMBAR FUSION 1 LEVEL;  Surgeon: [name], MD;  Location: [facility];  Service: Orthopedics;  Laterality: Left;  Left sided lumbar 3-4 lateral interbody fusion with allograft and instrumentation  . COLONOSCOPY  14   polyps rem  . JOINT REPLACEMENT Right 2010  . NODE DISSECTION Right 05/24/2015   Procedure: NODE DISSECTION;  Surgeon: [name], MD;  Location: [facility];  Service: Thoracic;  Laterality: Right;  . SEGMENTECOMY Right 05/24/2015   Procedure: RIGHT LOWER LOBE SUPERIOR SEGMENTECTOMY;  Surgeon: [name], MD;  Location: [facility];  Service: Thoracic;  Laterality: Right;  . [place]  2012  .  VIDEO ASSISTED THORACOSCOPY (VATS)/WEDGE RESECTION Right 05/24/2015   Procedure: RIGHT VIDEO ASSISTED THORACOSCOPY (VATS)/ RIGHTR LOWER LOBE WEDGE RESECTION;  Surgeon: [name], MD;  Location: [facility];  Service: Thoracic;  Laterality: Right;        Home Medications    Prior to Admission medications   Medication Sig Start Date End Date Taking? Authorizing Provider  albuterol (VENTOLIN HFA) 108 (90 Base) MCG/ACT inhaler INHALE USING 2 PUFFS EVERY 4 HOURS AS NEEDED ONLY IF YOUR CAN'T CATCH YOUR BREATH 05/18/17  Yes [name], MD  bisoprolol-hydrochlorothiazide [facility]) 2.5-6.25 MG tablet TAKE 1  TABLET BY MOUTH EVERY DAY 05/21/17  Yes [name], MD  clonazePAM [facility]) 1 MG tablet Take 1/2 tablet as needed twice a day for anxiety and 1-2 tablets at night as needed for sleep 02/19/17  Yes [name], MD  Guaifenesin [facility] MAXIMUM STRENGTH) 1200 MG TB12 Take 1 tablet (1,200 mg total) by mouth every 12 (twelve) hours as needed. 08/26/15  Yes [name], MD  lansoprazole (PREVACID) 30 MG capsule Take 30 mg by mouth 2 (two) times daily. 06/04/17  Yes [provider]  levocetirizine (XYZAL) 5 MG tablet Take 1 tablet (5 mg total) by mouth every evening. 01/12/16  Yes [name], MD  polyethylene glycol [facility]) packet Take 17 g by mouth daily.   Yes [provider]  pravastatin (PRAVACHOL) 40 MG tablet Take 1 tablet (40 mg total) by mouth at bedtime. 05/07/17  Yes [name], MD  ranitidine (ZANTAC) 150 MG capsule Take 150 mg by mouth every evening. As needed   Yes [provider]  senna (SENOKOT) 8.6 MG TABS tablet Take 2 tablets (17.2 mg total) by mouth at bedtime. 06/24/15  Yes [name], MD  traMADol (ULTRAM) 50 MG tablet Take 1-2 tablets (50-100 mg total) by mouth every 8 (eight) hours as needed. for pain 05/19/17  Yes [name], MD  umeclidinium-vilanterol [facility] ELLIPTA) 62.5-25 MCG/INH AEPB Inhale 1 puff into the lungs daily. 04/19/17  Yes [name], MD  amoxicillin (AMOXIL) 500 MG capsule Take 2,000 mg by mouth once. Take 2000mg  by mouth prior to dental procedure. 05/09/17   [provider]  methocarbamol (ROBAXIN) 500 MG tablet Take 1 tablet (500 mg total) by mouth 2 (two) times daily. 06/04/17   [name], Forbes Cellar, MD  OXYGEN 2lpm with exertion only    [provider]    Family History Family History  Problem Relation Age of Onset  . Cancer Mother        colon  . Heart disease Father   . Diabetes Sister   . Hypertension Sister   . Cancer Sister   . Hypertension Brother   . Heart disease Maternal Grandmother        heart attack      Social History Social History   Tobacco Use  . Smoking status: Former Smoker    Packs/day: 1.00    Years: 35.00    Pack years: 35.00    Types: Cigarettes    Last attempt to quit: 09/24/2011    Years since quitting: 5.6  . Smokeless tobacco: Never Used  Substance Use Topics  . Alcohol use: No    Alcohol/week: 0.0 oz    Comment: weekly  . Drug use: No     Allergies   Aspirin and Morphine and related   Review of Systems Review of Systems  ROS reviewed and all otherwise negative except for  mentioned in HPI   Physical Exam Updated Vital Signs BP 137/88 (BP Location: Right Arm)   Pulse 79   Temp 98 F (36.7 C) (Oral)   Resp 16   Ht 5\' 7"  (1.702 m)   Wt 81.2 kg (179 lb)   SpO2 98%   BMI 28.04 kg/m  Vitals reviewed Physical Exam  Physical Examination: General appearance - alert, well appearing, and in no distress Mental status - alert, oriented to person, place, and time Eyes - pupils equal and reactive, extraocular eye movements intact Mouth - mucous membranes moist, pharynx normal without lesions Neck - supple, no significant adenopathy Chest - clear to auscultation, no wheezes, rales or rhonchi, symmetric air entry Heart - normal rate, regular rhythm, normal S1, S2, no murmurs, rubs, clicks or gallops Abdomen - soft, nontender, nondistended, no masses or organomegaly Neurological - alert, orientedx 3, cranial nerves 2-12 tested and intact, strength 5/5 in extremities x 4, sensation intact Extremities - peripheral pulses normal, no pedal edema, no clubbing or cyanosis Skin - normal coloration and turgor, no rashes   ED Treatments / Results  Labs (all labs ordered are listed, but only abnormal results are displayed) Labs Reviewed  CBC WITH DIFFERENTIAL/PLATELET - Abnormal; Notable for the following components:      Result Value   RBC 5.93 (*)    Hemoglobin 17.3 (*)    All other components within normal limits  BASIC METABOLIC PANEL - Abnormal; Notable  for the following components:   BUN 5 (*)    All other components within normal limits  MAGNESIUM    EKG EKG Interpretation  Date/Time:  Monday June 04 2017 15:41:36 EDT Ventricular Rate:  69 PR Interval:    QRS Duration: 88 QT Interval:  414 QTC Calculation: 444 R Axis:   10 Text Interpretation:  Sinus rhythm Prolonged PR interval Anteroseptal infarct, age indeterminate No significant change since last tracing Confirmed by Alfonzo Beers [phone]) on 06/04/2017 4:17:07 PM   Radiology Ct Head Wo Contrast  Result Date: 06/04/2017 CLINICAL DATA:  Involuntary muscle twitching. Neck pain radiating to upper extremities. History of lung cancer, hypertension, hyperlipidemia. EXAM: CT HEAD WITHOUT CONTRAST TECHNIQUE: Contiguous axial images were obtained from the base of the skull through the vertex without intravenous contrast. COMPARISON:  CT HEAD December 06, 2013 FINDINGS: BRAIN: No intraparenchymal hemorrhage, mass effect nor midline shift. Mild parenchymal brain volume loss progressed from prior CT. No acute large vascular territory infarcts. No abnormal extra-axial fluid collections. Basal cisterns are patent. VASCULAR: Mild calcific atherosclerosis of the carotid siphons. SKULL: No skull fracture. Partially empty sella. No significant scalp soft tissue swelling. SINUSES/ORBITS: The mastoid air-cells and included paranasal sinuses are well-aerated.The included ocular globes and orbital contents are non-suspicious. OTHER: None. IMPRESSION: 1. No acute intracranial process. 2. Mild parenchymal brain volume loss progressed from 2015. Electronically Signed   By: Elon Alas M.D.   On: 06/04/2017 16:27    Procedures Procedures (including critical care time)  Medications Ordered in ED Medications - No data to display   Initial Impression / Assessment and Plan / ED Course  I have reviewed the triage vital signs and the nursing notes.  Pertinent labs & imaging results that were available  during my care of the patient were reviewed by me and considered in my medical decision making (see chart for details).    Patient presents with complaint of muscle spasms.  Symptoms began yesterday and are intermittent.  He states that they are improving.  He  feels twitching in his upper extremities and upper chest.  On my reevaluation the muscle spasms were present.  He has no weakness on exam and cranial nerves are intact.  His electrolytes are normal.  His head CT has no acute findings.  Discussed with patient that he should arrange for follow-up with his primary care doctor and also for outpatient follow-up with neurology.  Will give muscle relaxer to see if this will help his symptoms.  Discharged with strict return precautions.  Pt agreeable with plan.  Final Clinical Impressions(s) / ED Diagnoses   Final diagnoses:  Muscle spasm    ED Discharge Orders        Ordered    methocarbamol (ROBAXIN) 500 MG tablet  2 times daily     06/04/17 1655       Chaun Uemura, Forbes Cellar, MD 06/04/17 2229

## 2017-06-05 ENCOUNTER — Ambulatory Visit: Payer: Self-pay | Admitting: *Deleted

## 2017-06-05 ENCOUNTER — Telehealth: Payer: Self-pay | Admitting: Internal Medicine

## 2017-06-05 MED ORDER — UMECLIDINIUM BROMIDE 62.5 MCG/INH IN AEPB: 1.0000 | INHALATION_SPRAY | Freq: Every day | RESPIRATORY_TRACT | 3 refills | Status: DC

## 2017-06-05 NOTE — Telephone Encounter (Signed)
I returned his call.   He was seen in the ED 06/04/17 for jerking in his arms and upper chest.   They could not find anything wrong.  Did CT scan of head which was normal.   He was instructed to follow up with his PCP.  He was wondering if his medications could be causing his symptoms.   He wants to go over all his medications to see if this is a possibility.  Dr. Raul Del next availability is Jun 26, 2017 at 8:00am.    I have put him on the waiting list in case someone cancels with Dr. Brigitte Pulse.   He refuses to see any other provider.    I instructed him to return to the ED if his symptoms became worse or his condition changed between now and his appt.   He verbalized understanding.    I have routed a note to Dr. Brigitte Pulse making her aware of his situation.  Reason for Disposition . Caller has URGENT medication question about med that PCP prescribed and triager unable to answer question  Answer Assessment - Initial Assessment Questions 1. SYMPTOMS: "Do you have any symptoms?"     I am wondering if my medications are causing my muscle spasms or jerking.   2. SEVERITY: If symptoms are present, ask "Are they mild, moderate or severe?"     I'm still having the jerking and muscle spasms today.   (See ED notes).    I took the Tramadol this morning and these symptoms started again.  Protocols used: MEDICATION QUESTION CALL-A-AH

## 2017-06-05 NOTE — Telephone Encounter (Signed)
Very unlikely unless also using lots of saba which should not be doing if following instructions  If not overusing saba then ok to try incruse one click q am and stop anoro (ok to give sample x 2 weeks or full month  rx whichever we can) but see me w/in 2 weeks with all active meds in hand to regroup

## 2017-06-05 NOTE — Telephone Encounter (Signed)
Pt c/o muscle tremors in hands, arms,chest X2 days.  Pt went to ED yesterday, was put on Methocarbomol- started taking this today.  Pt thinks that Anoro could be causing it; pt's reasoning is that his tremors worsened this morning after taking Anoro.  Pt has not previously had this or any problems with Anoro.  Requesting MW's recs.    I also advised pt to contact PCP per ED recommendations.    MW please advise on recs.  Thanks.   05/22/17 AVS: Instructions   Wear 02 with activity that makes you short of breath   Only use your albuterol as a rescue medication to be used if you can't catch your breath by resting or doing a relaxed purse lip breathing pattern.  - The less you use it, the better it will work when you need it. - Ok to use up to 2 puffs  every 4 hours if you must but call for immediate appointment if use goes up over your usual need - Don't leave home without it !!  (think of it like the spare tire for your car)      Please schedule a follow up visit in 3 months but call sooner if needed

## 2017-06-05 NOTE — Telephone Encounter (Signed)
anoro has been discontinued and incruse ordered. Patient is aware. Nothing further needed. Patient has been scheduled.

## 2017-06-10 ENCOUNTER — Other Ambulatory Visit: Payer: Self-pay | Admitting: Internal Medicine

## 2017-06-12 DIAGNOSIS — Z125 Encounter for screening for malignant neoplasm of prostate: Secondary | ICD-10-CM | POA: Diagnosis not present

## 2017-06-12 DIAGNOSIS — N400 Enlarged prostate without lower urinary tract symptoms: Secondary | ICD-10-CM | POA: Diagnosis not present

## 2017-06-12 DIAGNOSIS — N5201 Erectile dysfunction due to arterial insufficiency: Secondary | ICD-10-CM | POA: Diagnosis not present

## 2017-06-12 DIAGNOSIS — R972 Elevated prostate specific antigen [PSA]: Secondary | ICD-10-CM | POA: Diagnosis not present

## 2017-06-22 ENCOUNTER — Ambulatory Visit (INDEPENDENT_AMBULATORY_CARE_PROVIDER_SITE_OTHER): Payer: Medicare Other | Admitting: Internal Medicine

## 2017-06-22 ENCOUNTER — Encounter: Payer: Self-pay | Admitting: Internal Medicine

## 2017-06-22 VITALS — BP 118/76 | HR 85 | Ht 67.0 in | Wt 177.0 lb

## 2017-06-22 DIAGNOSIS — J449 Chronic obstructive pulmonary disease, unspecified: Secondary | ICD-10-CM | POA: Diagnosis not present

## 2017-06-22 DIAGNOSIS — J9611 Chronic respiratory failure with hypoxia: Secondary | ICD-10-CM | POA: Diagnosis not present

## 2017-06-22 NOTE — Assessment & Plan Note (Signed)
-   Spirometry  03/01/12  FEV1  1.63 - Spirometry   09/05/13 FEV1  1.34 (46%) ratio 44  - PFTs   10/29/2013    FEV1    1.70 (62%) ratio 50% no better after ssaba and DLCO 48%  - 09/05/2013  Walked RA x 3 laps @ fast pace @  185 ft each stopped due to end of study, fast pace,sat 89% at end  - 01/06/14 gradual worsening on anoro plus increase need for saba  - 01/28/2014   > try spiriva respimat/ symbicort 160 2bid > changed to symbicort 160 2bid only on 01/28/14  - 02/25/2014   90%   > changed to symbicort 80 2bid since hoarse on 160 and clear on exam  - 11/19/2015 rechallenge with symbicort 160 Take 2 puffs first thing in am and then another 2 puffs about 12 hours later.  - PFT's  01/10/2016  FEV1 1.38 (51 % ) ratio 47  p 1 % improvement from saba p symb 160  prior to study with DLCO  43/41  % corrects to 47  % for alv volume   - 02/21/2017  After extensive coaching inhaler device  effectiveness =    90% vs baseline < 25%  - 02/21/2017 changed to bevespi 2 bid  - 04/19/2017  Demonstrated elipta > try anoro to see if cost less/ works as well  - PFT's  05/22/2017  FEV1 1.21  (46 % ) ratio 46  p 1 % improvement from saba p anoro and albuterol 1 h prior to study with DLCO  38 % corrects to 49 % for alv volume     - changed to Incruse 06/04/17 due to tremors > resolved   Not clear the laba caused a problem but off the laba and on just lama has not noted any change in ex tol or need for saba so overall adequate rx  He asked about copd stage/ prognosis > overall severe but relatively well compensated on present rx so no changes needed  - The proper method of use, as well as anticipated side effects, of a metered-dose inhaler are discussed and demonstrated to the patient.    I had an extended discussion with the patient reviewing all relevant studies completed to date and  lasting 15 to 20 minutes of a 25 minute visit    See device teaching which extended face to face time for this visit.  Each maintenance  medication was reviewed in detail including emphasizing most importantly the difference between maintenance and prns and under what circumstances the prns are to be triggered using an action plan format that is not reflected in the computer generated alphabetically organized AVS which I have not found useful in most complex patients, especially with respiratory illnesses  Please see AVS for specific instructions unique to this visit that I personally wrote and verbalized to the the pt in detail and then reviewed with pt  by my nurse highlighting any  changes in therapy recommended at today's visit to their plan of care.

## 2017-06-22 NOTE — Assessment & Plan Note (Signed)
SATURATION QUALIFICATIONS:  11/19/2015  Patient Saturations on Room Air at Rest = 96% Patient Saturations on Room Air while Ambulating = 86% Patient Saturations on 2  Liters of oxygen while Ambulating = 96% - 05/16/2016 :  Patient Saturations on Room Air at Rest = 88%--- increased to 99% on 2lpm continuous o2//lmr  Reviewed the recently published data that suggests 02 supplementation with isolated ex related desats is optional but my rec is def use it if it helps him be more active

## 2017-06-22 NOTE — Patient Instructions (Signed)
No change in your medications is needed   Please schedule a follow up visit in 6  months but call sooner if needed  with all medications /inhalers/ solutions in hand so we can verify exactly what you are taking. This includes all medications from all doctors and over the counters

## 2017-06-22 NOTE — Progress Notes (Signed)
Subjective:    Patient ID: Elijah Marshall, male    DOB: 1950/09/01  MRN: [id number]   Brief patient profile:  67  yobm quit smoking 2013  and able work out at [employer] and did fine until hurt back 2011 then 2 back surgeries and knee surgery  and more noticeable doe  since then but also limited by back and s/p sup segmentectomy RLL NSC lung ca Ia 05/2015  referred to pulmonary clinic 09/05/2013 for ? Copd  With GOLD II criteria 10/29/13 and confirmed 01/10/2016     History of Present Illness  09/05/2013 1st [facility] office visit/ Elijah Marshall  Chief Complaint  Patient presents with  . Pulmonary Consult    Referred per Dr. Norberto Sorenson.  Pt c/o SOB for the past 3 yrs. He states that he was dxed with COPD back in 2012 or 2013.  He states that that he sometimes has trouble with breathing when walking up stairs and lifting things.    rx spiriva first, alb, dulera not as effective  No problem at rest or supine Assoc nasal congestion much better p shot (?depomedrol) and bad again x sev years corresponds to worse doe  Ex = walking one mile slower pace than wife,trouble with hills, worse in heat  Bad hb even on dexilant  Min dry cough  rec Stop spiriva  Start anoro two puffs off one click each am Zantac 150 mg one at  Bedtime Prednisone 10 mg take  4 each am x 2 days,   2 each am x 2 days,  1 each am x 2 days and stop  GERD diet    04/19/2017  f/u ov/Elijah Marshall re:  GOLD II copd /  Chief Complaint  Patient presents with  . Follow-up    Patient was told to follow up with formulary in hand to discuss medications.   Dyspnea:   improved with walking back up  to 30 min on 2lpm  Cough: none Sleep: no resp problems SABA use:  No change on bevespi vs symbicort / concerned with cost issues rec Plan A = Automatic = Bevespi 2 every 12 hours or ANORO one click each am  But pick the one that works the best for you and if they are both the same then chose the less costly  Plan B = Backup Only use your albuterol  as a rescue medication    05/22/2017  f/u ov/Elijah Marshall re: copd gold III/ not using concentrator /  Has portable and uses 2lpm  Chief Complaint  Patient presents with  . Follow-up    PFT done today, reports SOB on exertion.   Dyspnea:  Still walking up 30 min s stopping while on 2lpm  Cough: no Sleep: RA flat  SABA use:  avg rarely but used one hour prior to arrival against advice for pfts today  rec Wear 02 with activity that makes you short of breath Only use your albuterol as a rescue medication Please schedule a follow up visit in 3 months but call sooner if needed     06/22/2017  f/u ov/Elijah Marshall re: GOLD III/ on incruse since tremors reported on 06/04/17  Chief Complaint  Patient presents with  . Follow-up    He has been coughing up mininal yellow sputum. He rarely uses his albuterol inhaler.  He states that he is no longer having the muscle spasms that he had been having.   Dyspnea:  Up to 30 min on 02 slow pace  Cough: no Sleep: flat/ no 02 SABA use: no rescue  Muscle spasms occurred over 48 h and have resolved off LABA > ER w/u 06/04/17 neg   No obvious day to day or daytime variability or assoc excess/ purulent sputum or mucus plugs or hemoptysis or cp or chest tightness, subjective wheeze or overt sinus or hb symptoms. No unusual exposure hx or h/o childhood pna/ asthma or knowledge of premature birth.  Sleeping  Ok flat   without nocturnal  or early am exacerbation  of respiratory  c/o's or need for noct saba. Also denies any obvious fluctuation of symptoms with weather or environmental changes or other aggravating or alleviating factors except as outlined above   Current Allergies, Complete Past Medical History, Past Surgical History, Family History, and Social History were reviewed in [employer] record.  ROS  The following are not active complaints unless bolded Hoarseness, sore throat, dysphagia, dental problems, itching, sneezing,  nasal congestion or  discharge of excess mucus or purulent secretions, ear ache,   fever, chills, sweats, unintended wt loss or wt gain, classically pleuritic or exertional cp,  orthopnea pnd or arm/hand swelling  or leg swelling, presyncope, palpitations, abdominal pain, anorexia, nausea, vomiting, diarrhea  or change in bowel habits or change in bladder habits, change in stools or change in urine, dysuria, hematuria,  rash, arthralgias, visual complaints, headache, numbness, weakness or ataxia or problems with walking or coordination,  change in mood or  memory.        Current Meds  Medication Sig  . albuterol (VENTOLIN HFA) 108 (90 Base) MCG/ACT inhaler INHALE USING 2 PUFFS EVERY 4 HOURS AS NEEDED ONLY IF YOUR CAN'T CATCH YOUR BREATH  . bisoprolol-hydrochlorothiazide (ZIAC) 2.5-6.25 MG tablet TAKE 1 TABLET BY MOUTH EVERY DAY  . clonazePAM (KLONOPIN) 1 MG tablet Take 1/2 tablet as needed twice a day for anxiety and 1-2 tablets at night as needed for sleep  . Guaifenesin (MUCINEX MAXIMUM STRENGTH) 1200 MG TB12 Take 1 tablet (1,200 mg total) by mouth every 12 (twelve) hours as needed.  . lansoprazole (PREVACID) 30 MG capsule Take 30 mg by mouth 2 (two) times daily.  Marland Kitchen levocetirizine (XYZAL) 5 MG tablet Take 1 tablet (5 mg total) by mouth every evening.  . methocarbamol (ROBAXIN) 500 MG tablet Take 1 tablet (500 mg total) by mouth 2 (two) times daily.  . OXYGEN 2lpm with exertion only  . polyethylene glycol (MIRALAX) packet Take 17 g by mouth daily.  . pravastatin (PRAVACHOL) 40 MG tablet Take 1 tablet (40 mg total) by mouth at bedtime.  . ranitidine (ZANTAC) 150 MG capsule Take 150 mg by mouth every evening. As needed  . senna (SENOKOT) 8.6 MG TABS tablet Take 2 tablets (17.2 mg total) by mouth at bedtime.  . traMADol (ULTRAM) 50 MG tablet Take 1-2 tablets (50-100 mg total) by mouth every 8 (eight) hours as needed. for pain  . umeclidinium bromide (INCRUSE ELLIPTA) 62.5 MCG/INH AEPB Inhale 1 puff into the lungs daily.              Objective:   Physical Exam   amb bm nad   06/22/2017        177  05/22/2017        183  04/19/2017        179      07/31/2016        182  05/16/2016        181  04/28/2016  185  01/10/2016        179    11/19/15 182 lb (82.6 kg)  11/11/15 180 lb (81.6 kg)  11/04/15 181 lb 3.5 oz (82.2 kg)      Vital signs reviewed - Note on arrival 02 sats  91% on RA   06/22/2017  HEENT: nl   oropharynx. Nl external ear canals without cough reflex - top partial plate/ moderate bilateral non-specific turbinate edema     NECK :  without JVD/Nodes/TM/ nl carotid upstrokes bilaterally   LUNGS: no acc muscle use,  Mod barrel  contour chest wall with bilateral  Distant bs s audible wheeze and  without cough on insp or exp maneuver and mod   Hyperresonant  to  percussion bilaterally     CV:  RRR  no s3 or murmur or increase in P2, and no edema   ABD:  soft and nontender with pos mid insp Hoover's  in the supine position. No bruits or organomegaly appreciated, bowel sounds nl  MS:   Nl gait/  ext warm without deformities, calf tenderness, cyanosis or clubbing No obvious joint restrictions   SKIN: warm and dry without lesions    NEURO:  alert, approp, nl sensorium with  no motor or cerebellar deficits apparent.

## 2017-06-25 ENCOUNTER — Encounter

## 2017-06-26 ENCOUNTER — Ambulatory Visit: Payer: Self-pay | Admitting: Family Medicine

## 2017-07-17 ENCOUNTER — Other Ambulatory Visit: Payer: Self-pay | Admitting: Family Medicine

## 2017-07-19 ENCOUNTER — Ambulatory Visit (INDEPENDENT_AMBULATORY_CARE_PROVIDER_SITE_OTHER): Payer: Medicare Other | Admitting: Family Medicine

## 2017-07-19 ENCOUNTER — Other Ambulatory Visit: Payer: Self-pay

## 2017-07-19 ENCOUNTER — Encounter: Payer: Self-pay | Admitting: Family Medicine

## 2017-07-19 VITALS — BP 124/86 | HR 86 | Temp 97.9°F | Resp 16 | Ht 67.0 in | Wt 172.8 lb

## 2017-07-19 DIAGNOSIS — K625 Hemorrhage of anus and rectum: Secondary | ICD-10-CM | POA: Diagnosis not present

## 2017-07-19 DIAGNOSIS — M5416 Radiculopathy, lumbar region: Secondary | ICD-10-CM | POA: Diagnosis not present

## 2017-07-19 DIAGNOSIS — I10 Essential (primary) hypertension: Secondary | ICD-10-CM

## 2017-07-19 DIAGNOSIS — R933 Abnormal findings on diagnostic imaging of other parts of digestive tract: Secondary | ICD-10-CM | POA: Diagnosis not present

## 2017-07-19 NOTE — Progress Notes (Addendum)
Subjective:  By signing my name below, I, Elijah Marshall, attest that this documentation has been prepared under the direction and in the presence of [name], MD. Electronically Signed: Moises Marshall, [place]. 07/19/2017 , 2:28 PM .  Patient was seen in Room 3 .   Patient ID: Elijah Marshall, male    DOB: 04/06/1950, 67 y.o.   MRN: [id number] Chief Complaint  Patient presents with  . Hypertension    3 months   . Medication Refill   HPI Elijah Marshall is a 67 y.o. male who presents to Primary Care at [facility] for follow up on HTN and medication refill.   HTN + rash There was concern he was having fixed drug reaction to bisoprolol-hctz, so tried stopping it and taking previously prescribed metoprolol in its place to see if rash will resolve. Lipids were at goal so continue pravastatin unchanged. Patient states rash had resolved and has been taking bisoprolol.   Hospitalization of muscle spasms Patient was seen in the ER 6 weeks prior for tremulous upper extremities and spasms for facial muscles. He notes symptoms of skin crawling only occurred when he is laying down resting; subsides with sitting up and distraction. He also mentioned elevated BP and irregular heart rate but noted that he had been under a lot of stress. He had normal electrolytes and normal head CT. He was started on a trial of methocarbamol. Concerned caused by his Anoro inhaler as it was worse in the morning after taking it so was changed to Incruse; after which the spasms resolved.   Back pain He reports flare of back pain with pain radiating down his right leg that started about 3 days ago. He has a history of left hip problems. He hasn't had right hip xray done in a while. He usually takes tramadol tid when in more pain. He hasn't seen [name] in years; last seen after 2nd back surgery.   Colonoscopy referral He requests referral for colonoscopy to [facility]. He had prostate exam done 1-2 months ago, and noted his  rectum was narrowing. In the last few days, he has noticed bright red Marshall on the toilet paper when he's wiping after a bowel movement. He informs straining recently; he has miralax and senokot but not usually taking it recently. 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 He believes he's had some constipation caused by tramadol. He was also recently prescribed amoxicillin by his dentist.   Past Medical History:  Diagnosis Date  . Allergy   . Arthritis   . BPH with obstruction/lower urinary tract symptoms 2016   Nocturia  . Cancer [facility])    unsure at this time  . Cancer of lower lobe of right lung ([name])   . COPD (chronic obstructive pulmonary disease) ([place])    per patient's health survey - he put a ?  . Depression   . Erectile dysfunction due to arterial insufficiency   . GERD (gastroesophageal reflux disease)   . Hyperlipidemia   . Hypertension   . Non-small cell carcinoma of lung, right ([place]) 06/24/2015  . Shortness of breath   . Ulcer    Past Surgical History:  Procedure Laterality Date  . ANTERIOR FUSION LUMBAR SPINE  07/25/2012  . ANTERIOR LAT LUMBAR FUSION Left 07/25/2012   Procedure: ANTERIOR LATERAL LUMBAR FUSION 1 LEVEL;  Surgeon: [name], MD;  Location: [name];  Service: Orthopedics;  Laterality: Left;  Left sided  lumbar 3-4 lateral interbody fusion with allograft and instrumentation  . COLONOSCOPY  14   polyps rem  . JOINT REPLACEMENT Right 2010  . NODE DISSECTION Right 05/24/2015   Procedure: NODE DISSECTION;  Surgeon: [name], MD;  Location: [name];  Service: Thoracic;  Laterality: Right;  . SEGMENTECOMY Right 05/24/2015   Procedure: RIGHT LOWER LOBE SUPERIOR SEGMENTECTOMY;  Surgeon: [name], MD;  Location: [name];  Service: Thoracic;  Laterality: Right;  . [place]  2012  . VIDEO ASSISTED THORACOSCOPY (VATS)/WEDGE RESECTION Right 05/24/2015   Procedure: RIGHT VIDEO ASSISTED THORACOSCOPY (VATS)/  RIGHTR LOWER LOBE WEDGE RESECTION;  Surgeon: [name], MD;  Location: [name];  Service: Thoracic;  Laterality: Right;   Prior to Admission medications   Medication Sig Start Date End Date Taking? Authorizing Provider  albuterol (VENTOLIN HFA) 108 (90 Base) MCG/ACT inhaler INHALE USING 2 PUFFS EVERY 4 HOURS AS NEEDED ONLY IF YOUR CAN'T CATCH YOUR BREATH 06/11/17   [name], MD  amoxicillin (AMOXIL) 500 MG capsule Take 2,000 mg by mouth once. Take 2000mg  by mouth prior to dental procedure. 05/09/17   [provider]  bisoprolol-hydrochlorothiazide [facility]) 2.5-6.25 MG tablet TAKE 1 TABLET BY MOUTH EVERY DAY 07/17/17   [name], MD  clonazePAM [facility]) 1 MG tablet Take 1/2 tablet as needed twice a day for anxiety and 1-2 tablets at night as needed for sleep 02/19/17   [name], MD  Guaifenesin [facility] MAXIMUM STRENGTH) 1200 MG TB12 Take 1 tablet (1,200 mg total) by mouth every 12 (twelve) hours as needed. 08/26/15   [name], MD  lansoprazole (PREVACID) 30 MG capsule Take 30 mg by mouth 2 (two) times daily. 06/04/17   [provider]  levocetirizine (XYZAL) 5 MG tablet Take 1 tablet (5 mg total) by mouth every evening. 01/12/16   [name], MD  methocarbamol (ROBAXIN) 500 MG tablet Take 1 tablet (500 mg total) by mouth 2 (two) times daily. 06/04/17   Mabe, Forbes Cellar, MD  OXYGEN 2lpm with exertion only    [provider]  polyethylene glycol (MIRALAX) packet Take 17 g by mouth daily.    [provider]  pravastatin (PRAVACHOL) 40 MG tablet Take 1 tablet (40 mg total) by mouth at bedtime. 05/07/17   [name], MD  ranitidine (ZANTAC) 150 MG capsule Take 150 mg by mouth every evening. As needed    [provider]  senna (SENOKOT) 8.6 MG TABS tablet Take 2 tablets (17.2 mg total) by mouth at bedtime. 06/24/15   [name], MD  traMADol (ULTRAM) 50 MG tablet Take 1-2 tablets (50-100 mg total) by mouth every 8 (eight) hours as needed. for pain  05/19/17   [name], MD  umeclidinium bromide (INCRUSE ELLIPTA) 62.5 MCG/INH AEPB Inhale 1 puff into the lungs daily. 06/05/17   [name], MD   Allergies  Allergen Reactions  . Aspirin Nausea And Vomiting  . Morphine And Related Itching   Family History  Problem Relation Age of Onset  . Cancer Mother        colon  . Heart disease Father   . Diabetes Sister   . Hypertension Sister   . Cancer Sister   . Hypertension Brother   . Heart disease Maternal Grandmother        heart attack   Social History   Socioeconomic History  . Marital status: Married    Spouse name: Not on  file  . Number of children: Not on file  . Years of education: Not on file  . Highest education level: High school graduate  Occupational History  . Occupation: truck Government social research officer: [employer]  . Financial resource strain: Not hard at all  . Food insecurity:    Worry: Never true    Inability: Never true  . Transportation needs:    Medical: No    Non-medical: No  Tobacco Use  . Smoking status: Former Smoker    Packs/day: 1.00    Years: 35.00    Pack years: 35.00    Types: Cigarettes    Last attempt to quit: 09/24/2011    Years since quitting: 5.8  . Smokeless tobacco: Never Used  Substance and Sexual Activity  . Alcohol use: No    Alcohol/week: 0.0 oz    Comment: weekly  . Drug use: No  . Sexual activity: Not on file  Lifestyle  . Physical activity:    Days per week: 4 days    Minutes per session: 30 min  . Stress: To some extent  Relationships  . Social connections:    Talks on phone: More than three times a week    Gets together: More than three times a week    Attends religious service: More than 4 times per year    Active member of club or organization: No    Attends meetings of clubs or organizations: Never    Relationship status: Married  Other Topics Concern  . Not on file  Social History Narrative  . Not on file   Depression screen  [facility] 2/9 07/19/2017 04/17/2017 02/19/2017 11/11/2015 11/09/2015  Decreased Interest 1 0 0 0 1  Down, Depressed, Hopeless 0 2 1 1 1   PHQ - 2 Score 1 2 1 1 2   Altered sleeping - 0 2 1 1   Tired, decreased energy - 0 0 0 0  Change in appetite - 0 0 0 3  Feeling bad or failure about yourself  - 2 1 0 0  Trouble concentrating - 0 - 0 0  Moving slowly or fidgety/restless - 0 0 0 0  Suicidal thoughts - 0 0 0 0  PHQ-9 Score - 4 4 2 6   Difficult doing work/chores - Somewhat difficult Not difficult at all Not difficult at all Somewhat difficult    Review of Systems  Constitutional: Negative for fatigue and unexpected weight change.  Eyes: Negative for visual disturbance.  Respiratory: Negative for cough, chest tightness and shortness of breath.   Cardiovascular: Negative for chest pain, palpitations and leg swelling.  Gastrointestinal: Positive for anal bleeding. Negative for abdominal pain and Marshall in stool.  Musculoskeletal: Positive for back pain.  Skin: Negative for rash.  Neurological: Negative for dizziness, light-headedness and headaches.       Objective:   Physical Exam  Constitutional: He is oriented to person, place, and time. He appears well-developed and well-nourished. No distress.  HENT:  Head: Normocephalic and atraumatic.  Eyes: Pupils are equal, round, and reactive to light. EOM are normal.  Neck: Neck supple.  Cardiovascular: Normal rate, regular rhythm, S1 normal, S2 normal and normal heart sounds.  Pulmonary/Chest: Effort normal and breath sounds normal. No respiratory distress.  Musculoskeletal: Normal range of motion.  Tenderness over the low lumbar spine, tenderness to palpation over right SI joint, significant pain with flexion, internal and external rotation of right hip; possible straight leg raise on right  Neurological: He  is alert and oriented to person, place, and time.  Reflex Scores:      Patellar reflexes are 2+ on the right side and 2+ on the left side.       Achilles reflexes are 2+ on the right side and 2+ on the left side. Skin: Skin is warm and dry.  Psychiatric: He has a normal mood and affect. His behavior is normal.  Nursing note and vitals reviewed.   BP 124/86 (BP Location: Left Arm)   Pulse 86   Temp 97.9 F (36.6 C) (Oral)   Resp 16   Ht 5\' 7"  (1.702 m)   Wt 172 lb 12.8 oz (78.4 kg)   SpO2 95%   BMI 27.06 kg/m      Assessment & Plan:   annual wellness visit done 04/17/17   1. Abnormal colonoscopy - refer to [place] for repeat colonoscopy  2. Rectal bleeding - question of rectum stenosis versus fissures, constipation likely secondary to medication - be more proactive w/ stool softeners - take reg like other meds to avoid firm stools - not wait to treat until there is a problem;  Continue prevacid 30 bid  3. Lumbar back pain with radiculopathy affecting right lower extremity - okay to call in refill of methocarbamol if higher dose is helping (500 mg bid ineffective to pt instructed to try 1000 q8hrs). If methocarbamol ineffective at higher dose, ok to increase to zanaflex and call that in instead; okay to increase tramadol as needed, but watch for constipation; will be due for tramadol refill ~ 08/15/17 in a month likely but ok to refill earlier if needed due to exacerbation of pain. Rec f/u w/ [name] If pain worsening, consider trial of po prednisone taper  4.      Essential hypertension - well-controlled - ok to refill bisoprolol-hctz 2.5-6.25 qd x 90d supply x 1 refill when requested in 2 mos - ~early August. nml bmp 6 wks ago  Orders Placed This Encounter  Procedures  . Ambulatory referral to Gastroenterology    Referral Priority:   Routine    Referral Type:   Consultation    Referral Reason:   Specialty Services Required    Number of Visits Requested:   1  . Ambulatory referral to Orthopedic Surgery    Referral Priority:   Routine    Referral Type:   Surgical    Referral Reason:   Specialty Services Required     Referred to Provider:   Phylliss Bob, MD    Requested Specialty:   Orthopedic Surgery    Number of Visits Requested:   1    No orders of the defined types were placed in this encounter.   I personally performed the services described in this documentation, which was scribed in my presence. The recorded information has been reviewed and considered, and addended by me as needed.   [name], M.D.  Primary Care at [facility] [address] [place], [place] [address] [phone] phone [phone] fax  08/01/17 12:39 AM

## 2017-07-19 NOTE — Patient Instructions (Addendum)
You will get a call from [facility] to schedule an appointment within the next few weeks. Also, call Dr. Vita Erm office to sched an appointment. Try 2 tabs of the methocarbamol and let me know if it is effective and refill is needed or ineffective and would like to try other med.  Ok to increase the tramadol is needed temporarily. Let me know if the pain is worsening and you would like to try a prednisone taper to treat. IT is essential that you are proactive with the stool softeners and takes these regularly like all of your other medicaitons so that you keep your stools soft.    IF you received an x-ray today, you will receive an invoice from [facility]. Please contact [facility] at [phone] with questions or concerns regarding your invoice.   IF you received labwork today, you will receive an invoice from [facility]. Please contact [facility] at [phone] with questions or concerns regarding your invoice.   Our billing staff will not be able to assist you with questions regarding bills from these companies.  You will be contacted with the lab results as soon as they are available. The fastest way to get your results is to activate your My Chart account. Instructions are located on the last page of this paperwork. If you have not heard from [place] regarding the results in 2 weeks, please contact this office.     Piriformis Syndrome Piriformis syndrome is a condition that can cause pain and numbness in your buttocks and down the back of your leg. Piriformis syndrome happens when the small muscle that connects the base of your spine to your hip (piriformis muscle) presses on the nerve that runs down the back of your leg (sciatic nerve). The piriformis muscle helps your hip rotate and helps to bring your leg back and out. It also helps shift your weight while you are walking to keep you stable. The sciatic nerve runs under or through the piriformis. Damage to the piriformis  muscle can cause spasms that put pressure on the nerve below. This causes pain and discomfort while sitting and moving. The pain may feel as if it begins in the buttock and spreads (radiates) down your hip and thigh. What are the causes? This condition is caused by pressure on the sciatic nerve from the piriformis muscle. The piriformis muscle can get irritated with overuse, especially if other hip muscles are weak and the piriformis has to do extra work. Piriformis syndrome can also occur after an injury, like a fall onto your buttocks. What increases the risk? This condition is more likely to develop in:  Women.  People who sit for long periods of time.  Cyclists.  People who have weak buttocks muscles (gluteal muscles).  What are the signs or symptoms? Pain, tingling, or numbness that starts in the buttock and runs down the back of your leg (sciatica) is the most common symptom of this condition. Your symptoms may:  Get worse the longer you sit.  Get worse when you walk, run, or go up on stairs.  How is this diagnosed? This condition is diagnosed based on your symptoms, medical history, and physical exam. During this exam, your health care provider may move your leg into different positions to check for pain. He or she will also press on the muscles of your hip and buttock to see if that increases your symptoms. You may also have an X-ray or MRI. How is this treated? Treatment for this condition  may include:  Stopping all activities that cause pain or make your condition worse.  Using heat or ice to relieve pain as told by your health care provider.  Taking medicines to reduce pain and swelling.  Taking a muscle relaxer to release the piriformis muscle.  Doing range-of-motion and strengthening exercises (physical therapy) as told by your health care provider.  Massaging the affected area.  Getting an injection of an anti-inflammatory medicine or muscle relaxer to reduce  inflammation and muscle tension.  In rare cases, you may need surgery to cut the muscle and release pressure on the nerve if other treatments do not work. Follow these instructions at home:  Take over-the-counter and prescription medicines only as told by your health care provider.  Do not sit for long periods. Get up and walk around every 20 minutes or as often as told by your health care provider.  If directed, apply heat to the affected area as often as told by your health care provider. Use the heat source that your health care provider recommends, such as a moist heat pack or a heating pad. ? Place a towel between your skin and the heat source. ? Leave the heat on for 20-30 minutes. ? Remove the heat if your skin turns bright red. This is especially important if you are unable to feel pain, heat, or cold. You may have a greater risk of getting burned.  If directed, apply ice to the injured area. ? Put ice in a plastic bag. ? Place a towel between your skin and the bag. ? Leave the ice on for 20 minutes, 2-3 times a day.  Do exercises as told by your health care provider.  Return to your normal activities as told by your health care provider. Ask your health care provider what activities are safe for you.  Keep all follow-up visits as told by your health care provider. This is important. How is this prevented?  Do not sit for longer than 20 minutes at a time. When you sit, choose padded surfaces.  Warm up and stretch before being active.  Cool down and stretch after being active.  Give your body time to rest between periods of activity.  Make sure to use equipment that fits you.  Maintain physical fitness, including: ? Strength. ? Flexibility. Contact a health care provider if:  Your pain and stiffness continue or get worse.  Your leg or hip becomes weak.  You have changes in your bowel function or bladder function. This information is not intended to replace advice  given to you by your health care provider. Make sure you discuss any questions you have with your health care provider. Document Released: 01/23/2005 Document Revised: 09/28/2015 Document Reviewed: 01/05/2015 Elsevier Interactive Patient Education  2018 [employer].  Piriformis Syndrome Rehab Ask your health care provider which exercises are safe for you. Do exercises exactly as told by your health care provider and adjust them as directed. It is normal to feel mild stretching, pulling, tightness, or discomfort as you do these exercises, but you should stop right away if you feel sudden pain or your pain gets worse.Do not begin these exercises until told by your health care provider. Stretching and range of motion exercises These exercises warm up your muscles and joints and improve the movement and flexibility of your hip and pelvis. These exercises also help to relieve pain, numbness, and tingling. Exercise A: Hip rotators  1. Lie on your back on a firm surface. 2.  Pull your left / right knee toward your same shoulder with your left / right hand until your knee is pointing toward the ceiling. Hold your left / right ankle with your other hand. 3. Keeping your knee steady, gently pull your left / right ankle toward your other shoulder until you feel a stretch in your buttocks. 4. Hold this position for __________ seconds. Repeat __________ times. Complete this stretch __________ times a day. Exercise B: Hip extensors 1. Lie on your back on a firm surface. Both of your legs should be straight. 2. Pull your left / right knee to your chest. Hold your leg in this position by holding onto the back of your thigh or the front of your knee. 3. Hold this position for __________ seconds. 4. Slowly return to the starting position. Repeat __________ times. Complete this stretch __________ times a day. Strengthening exercises These exercises build strength and endurance in your hip and thigh muscles.  Endurance is the ability to use your muscles for a long time, even after they get tired. Exercise C: Straight leg raises ( hip abductors) 1. Lie on your side with your left / right leg in the top position. Lie so your head, shoulder, knee, and hip line up. Bend your bottom knee to help you balance. 2. Lift your top leg up 4-6 inches (10-15 cm), keeping your toes pointed straight ahead. 3. Hold this position for __________ seconds. 4. Slowly lower your leg to the starting position. Let your muscles relax completely. Repeat __________ times. Complete this exercise__________ times a day. Exercise D: Hip abductors and rotators, quadruped  1. Get on your hands and knees on a firm, lightly padded surface. Your hands should be directly below your shoulders, and your knees should be directly below your hips. 2. Lift your left / right knee out to the side. Keep your knee bent. Do not twist your body. 3. Hold this position for __________ seconds. 4. Slowly lower your leg. Repeat __________ times. Complete this exercise__________ times a day. Exercise E: Straight leg raises ( hip extensors) 1. Lie on your abdomen on a bed or a firm surface with a pillow under your hips. 2. Squeeze your buttock muscles and lift your left / right thigh off the bed. Do not let your back arch. 3. Hold this position for __________ seconds. 4. Slowly return to the starting position. Let your muscles relax completely before doing another repetition. Repeat __________ times. Complete this exercise__________ times a day. This information is not intended to replace advice given to you by your health care provider. Make sure you discuss any questions you have with your health care provider. Document Released: 01/23/2005 Document Revised: 09/28/2015 Document Reviewed: 01/05/2015 Elsevier Interactive Patient Education  [employer].

## 2017-07-23 ENCOUNTER — Encounter: Payer: Self-pay | Admitting: Gastroenterology

## 2017-08-16 ENCOUNTER — Other Ambulatory Visit: Payer: Self-pay | Admitting: Family Medicine

## 2017-08-16 NOTE — Telephone Encounter (Signed)
Bisoprolol-HCTZ refill Last Refill:07/17/17 # 30 1 RF Last OV: 07/19/17 PCP: Dr Brigitte Pulse [facility]

## 2017-08-22 ENCOUNTER — Ambulatory Visit: Payer: Medicare Other | Admitting: Internal Medicine

## 2017-08-27 ENCOUNTER — Encounter: Payer: Self-pay | Admitting: Internal Medicine

## 2017-08-27 ENCOUNTER — Ambulatory Visit (INDEPENDENT_AMBULATORY_CARE_PROVIDER_SITE_OTHER): Payer: Medicare Other | Admitting: Internal Medicine

## 2017-08-27 VITALS — BP 128/70 | HR 101 | Ht 67.0 in | Wt 176.6 lb

## 2017-08-27 DIAGNOSIS — J069 Acute upper respiratory infection, unspecified: Secondary | ICD-10-CM | POA: Diagnosis not present

## 2017-08-27 DIAGNOSIS — J449 Chronic obstructive pulmonary disease, unspecified: Secondary | ICD-10-CM | POA: Diagnosis not present

## 2017-08-27 DIAGNOSIS — J9611 Chronic respiratory failure with hypoxia: Secondary | ICD-10-CM

## 2017-08-27 MED ORDER — AZITHROMYCIN 250 MG PO TABS: ORAL_TABLET | ORAL | 0 refills | Status: DC

## 2017-08-27 MED ORDER — PREDNISONE 10 MG PO TABS: ORAL_TABLET | ORAL | 0 refills | Status: DC

## 2017-08-27 NOTE — Assessment & Plan Note (Signed)
SATURATION QUALIFICATIONS:  11/19/2015  Patient Saturations on Room Air at Rest = 96% Patient Saturations on Room Air while Ambulating = 86% Patient Saturations on 2  Liters of oxygen while Ambulating = 96% - 05/16/2016 :  Patient Saturations on Room Air at Rest = 88%--- increased to 99% on 2lpm continuous with activity     Adequate control on present rx, reviewed in detail with pt > no change in rx needed  = 2lpm with activity or sob at rest prn

## 2017-08-27 NOTE — Assessment & Plan Note (Signed)
rx zpak empirically as has severe underlying copd and Prednisone 10 mg take  4 each am x 2 days,   2 each am x 2 days,  1 each am x 2 days and stop for the inflammatory component   See copd re approp use of contingency meds

## 2017-08-27 NOTE — Patient Instructions (Addendum)
Prevacid 30 mg Take 30- 60 min before your first and last meals of the day  And continue Zantac 150 mg at bedtime - when throat is all better and cough is gone then ok to skip the pm dose of prevacid.   For cough > mucinex dm up  To 1200 mg every 12 hours as needed / ok to supplement with ultram up to every 4 hours if needed    Prednisone 10 mg take  4 each am x 2 days,   2 each am x 2 days,  1 each am x 2 days and stop   Zpak   See Tammy NP in 4  weeks with all your medications, even over the counter meds, separated in two separate bags, the ones you take no matter what vs the ones you stop once you feel better and take only as needed when you feel you need them.   Tammy  will generate for you a new user friendly medication calendar that will put [place] all on the same page re: your medication use.     Without this process, it simply isn't possible to assure that we are providing  your outpatient care  with  the attention to detail we feel you deserve.   If we cannot assure that you're getting that kind of care,  then we cannot manage your problem effectively from this clinic.  Once you have seen Tammy and we are sure that we're all on the same page with your medication use she will arrange follow up with me.

## 2017-08-27 NOTE — Progress Notes (Signed)
Subjective:    Patient ID: Elijah Marshall, male    DOB: [birth year]  MRN: [id number]   Brief patient profile:  67  yobm quit smoking 2013  and able work out at [employer] and did fine until hurt back 2011 then 2 back surgeries and knee surgery  and more noticeable doe  since then but also limited by back and s/p sup segmentectomy [facility] lung ca Ia 05/2015  referred to pulmonary clinic 09/05/2013 for ? Copd  With GOLD II criteria 10/29/13 and confirmed 01/10/2016     History of Present Illness  09/05/2013 1st [facility] office visit/ Zan Triska  Chief Complaint  Patient presents with  . Pulmonary Consult    Referred per Dr. Norberto Sorenson.  Pt c/o SOB for the past 3 yrs. He states that he was dxed with COPD back in 2012 or 2013.  He states that that he sometimes has trouble with breathing when walking up stairs and lifting things.    rx spiriva first, alb, dulera not as effective  No problem at rest or supine Assoc nasal congestion much better p shot (?depomedrol) and bad again x sev years corresponds to worse doe  Ex = walking one mile slower pace than wife,trouble with hills, worse in heat  Bad hb even on dexilant  Min dry cough  rec Stop spiriva  Start anoro two puffs off one click each am Zantac 150 mg one at  Bedtime Prednisone 10 mg take  4 each am x 2 days,   2 each am x 2 days,  1 each am x 2 days and stop  GERD diet    04/19/2017  f/u ov/Alyviah Crandle re:  GOLD II copd /  Chief Complaint  Patient presents with  . Follow-up    Patient was told to follow up with formulary in hand to discuss medications.   Dyspnea:   improved with walking back up  to 30 min on 2lpm  Cough: none Sleep: no resp problems SABA use:  No change on bevespi vs symbicort / concerned with cost issues rec Plan A = Automatic = Bevespi 2 every 12 hours or ANORO one click each am  But pick the one that works the best for you and if they are both the same then chose the less costly  Plan B = Backup Only use your albuterol  as a rescue medication    08/27/2017 acute extended ov/Tadeusz Stahl re:  GOLD  III / incruse and 02 2lpm with ex only  Chief Complaint  Patient presents with  . Acute Visit    pt reports breathing is ok but he thinks he has a sinus infection, chest pain and coughing up yellow phlegm  acutely worse x 24 h with ST, generalized upper ant cp with coughing only producing thick yellow mucus /hoarseness  - poor insight into maint vs prns and not using ppi bid as rec   No obvious day to day or daytime variability or assoc  mucus plugs or hemoptysis or   chest tightness, subjective wheeze or overt sinus or hb symptoms.    Also denies any obvious fluctuation of symptoms with weather or environmental changes or other aggravating or alleviating factors except as outlined above   No unusual exposure hx or h/o childhood pna/ asthma or knowledge of premature birth.  Current Allergies, Complete Past Medical History, Past Surgical History, Family History, and Social History were reviewed in [employer] record.  ROS  The following  are not active complaints unless bolded Hoarseness, sore throat, dysphagia, dental problems, itching, sneezing,  nasal congestion or discharge of excess mucus or purulent secretions, ear ache,   fever, chills, sweats, unintended wt loss or wt gain, classically pleuritic or exertional cp,  orthopnea pnd or arm/hand swelling  or leg swelling, presyncope, palpitations, abdominal pain, anorexia, nausea, vomiting, diarrhea  or change in bowel habits or change in bladder habits, change in stools or change in urine, dysuria, hematuria,  rash, arthralgias, visual complaints, headache, numbness, weakness or ataxia or problems with walking or coordination,  change in mood or  memory.        Current Meds  Medication Sig  . albuterol (VENTOLIN HFA) 108 (90 Base) MCG/ACT inhaler INHALE USING 2 PUFFS EVERY 4 HOURS AS NEEDED ONLY IF YOUR CAN'T CATCH YOUR BREATH  .  bisoprolol-hydrochlorothiazide (ZIAC) 2.5-6.25 MG tablet TAKE 1 TABLET BY MOUTH EVERY DAY  . clonazePAM (KLONOPIN) 1 MG tablet Take 1/2 tablet as needed twice a day for anxiety and 1-2 tablets at night as needed for sleep  . Guaifenesin (MUCINEX MAXIMUM STRENGTH) 1200 MG TB12 Take 1 tablet (1,200 mg total) by mouth every 12 (twelve) hours as needed.  . lansoprazole (PREVACID) 30 MG capsule Take 30 mg by mouth 2 (two) times daily.  Marland Kitchen levocetirizine (XYZAL) 5 MG tablet Take 1 tablet (5 mg total) by mouth every evening.  . methocarbamol (ROBAXIN) 500 MG tablet Take 1 tablet (500 mg total) by mouth 2 (two) times daily.  . OXYGEN 2lpm with exertion only  . polyethylene glycol (MIRALAX) packet Take 17 g by mouth daily.  . pravastatin (PRAVACHOL) 40 MG tablet Take 1 tablet (40 mg total) by mouth at bedtime.  . ranitidine (ZANTAC) 150 MG capsule Take 150 mg by mouth every evening. As needed  . senna (SENOKOT) 8.6 MG TABS tablet Take 2 tablets (17.2 mg total) by mouth at bedtime.  . traMADol (ULTRAM) 50 MG tablet Take 1-2 tablets (50-100 mg total) by mouth every 8 (eight) hours as needed. for pain  . umeclidinium bromide (INCRUSE ELLIPTA) 62.5 MCG/INH AEPB Inhale 1 puff into the lungs daily.             Objective:   Physical Exam   aamb mod hoarse bm nad   08/27/2017        176  06/22/2017        177  05/22/2017        183  04/19/2017        179      07/31/2016        182  05/16/2016        181  04/28/2016        185  01/10/2016        179    11/19/15 182 lb (82.6 kg)  11/11/15 180 lb (81.6 kg)  11/04/15 181 lb 3.5 oz (82.2 kg)      Vital signs reviewed - Note on arrival 02 sats  91% on RA    HEENT: nl dentition / oropharynx slt injected/ no purulent secretions. Nl external ear canals without cough reflex - mild bilateral non-specific turbinate edema     NECK :  without JVD/Nodes/TM/ nl carotid upstrokes bilaterally   LUNGS: no acc muscle use,  Mod barrel  contour chest wall with  bilateral  Distant bs s audible wheeze and  without cough on insp or exp maneuver and mod   Hyperresonant  to  percussion bilaterally  CV:  RRR  no s3 or murmur or increase in P2, and no edema   ABD:  soft and nontender with pos mid insp Hoover's  in the supine position. No bruits or organomegaly appreciated, bowel sounds nl  MS:   Nl gait/  ext warm without deformities, calf tenderness, cyanosis or clubbing No obvious joint restrictions   SKIN: warm and dry without lesions    NEURO:  alert, approp, nl sensorium with  no motor or cerebellar deficits apparent.

## 2017-08-27 NOTE — Assessment & Plan Note (Signed)
-   Spirometry  03/01/12  FEV1  1.63 - Spirometry   09/05/13 FEV1  1.34 (46%) ratio 44  - PFTs   10/29/2013    FEV1    1.70 (62%) ratio 50% no better after ssaba and DLCO 48%  - 09/05/2013  Walked RA x 3 laps @ fast pace @  185 ft each stopped due to end of study, fast pace,sat 89% at end  - 01/06/14 gradual worsening on anoro plus increase need for saba  - 01/28/2014   > try spiriva respimat/ symbicort 160 2bid > changed to symbicort 160 2bid only on 01/28/14  - 02/25/2014   90%   > changed to symbicort 80 2bid since hoarse on 160 and clear on exam  - 11/19/2015 rechallenge with symbicort 160 Take 2 puffs first thing in am and then another 2 puffs about 12 hours later.  - PFT's  01/10/2016  FEV1 1.38 (51 % ) ratio 47  p 1 % improvement from saba p symb 160  prior to study with DLCO  43/41  % corrects to 47  % for alv volume   - 02/21/2017  After extensive coaching inhaler device  effectiveness =    90% vs baseline < 25%  - 02/21/2017 changed to bevespi 2 bid  - 04/19/2017  Demonstrated elipta > try anoro to see if cost less/ works as well  - PFT's  05/22/2017  FEV1 1.21  (46 % ) ratio 46  p 1 % improvement from saba p anoro and albuterol 1 h prior to study with DLCO  38 % corrects to 49 % for alv volume   - changed to Incruse 06/04/17 due to tremors > resolved   - 08/27/2017  After extensive coaching inhaler device  effectiveness =    90%   Poor insight into maint vs prn's but relatively well compensated on lama/laba and despite uri really not having atypica aecopd on at present despite typical trigger so no change rx needed   However, needs better action plans and understanding of maint vs prns    To keep things simple, I have asked the patient to first separate medicines that are perceived as maintenance, that is to be taken daily "no matter what", from those medicines that are taken on only on an as-needed basis and I have given the patient examples of both, and then return to see our NP to generate a   detailed  medication calendar which should be followed until the next physician sees the patient and updates it.    I had an extended discussion with the patient reviewing all relevant studies completed to date and  lasting 15 to 20 minutes of a 25 minute visit    See device teaching which extended face to face time for this acute office visit.  Each maintenance medication was reviewed in detail including emphasizing most importantly the difference between maintenance and prns and under what circumstances the prns are to be triggered using an action plan format that is not reflected in the computer generated alphabetically organized AVS which I have not found useful in most complex patients, especially with respiratory illnesses  Please see AVS for specific instructions unique to this visit that I personally wrote and verbalized to the the pt in detail and then reviewed with pt  by my nurse highlighting any  changes in therapy recommended at today's visit to their plan of care.

## 2017-09-07 ENCOUNTER — Telehealth: Payer: Self-pay | Admitting: Internal Medicine

## 2017-09-07 ENCOUNTER — Ambulatory Visit (INDEPENDENT_AMBULATORY_CARE_PROVIDER_SITE_OTHER): Payer: Medicare Other | Admitting: Primary Care

## 2017-09-07 ENCOUNTER — Encounter: Payer: Self-pay | Admitting: Primary Care

## 2017-09-07 ENCOUNTER — Ambulatory Visit (INDEPENDENT_AMBULATORY_CARE_PROVIDER_SITE_OTHER)
Admission: RE | Admit: 2017-09-07 | Discharge: 2017-09-07 | Disposition: A | Discharge status: Home / Self Care | Payer: Medicare Other | Source: Ambulatory Visit | Attending: Primary Care | Admitting: Primary Care

## 2017-09-07 ENCOUNTER — Other Ambulatory Visit: Payer: Self-pay

## 2017-09-07 ENCOUNTER — Other Ambulatory Visit (INDEPENDENT_AMBULATORY_CARE_PROVIDER_SITE_OTHER): Payer: Medicare Other

## 2017-09-07 VITALS — BP 140/80 | HR 91 | Ht 68.0 in | Wt 176.4 lb

## 2017-09-07 DIAGNOSIS — R042 Hemoptysis: Secondary | ICD-10-CM | POA: Diagnosis not present

## 2017-09-07 DIAGNOSIS — C3491 Malignant neoplasm of unspecified part of right bronchus or lung: Secondary | ICD-10-CM | POA: Diagnosis not present

## 2017-09-07 DIAGNOSIS — J439 Emphysema, unspecified: Secondary | ICD-10-CM | POA: Diagnosis not present

## 2017-09-07 LAB — BASIC METABOLIC PANEL
BUN: 7 mg/dL (ref 6–23)
CO2: 31 mEq/L (ref 19–32)
Calcium: 9.7 mg/dL (ref 8.4–10.5)
Chloride: 99 mEq/L (ref 96–112)
Creatinine, Ser: 1.15 mg/dL (ref 0.40–1.50)
GFR: 81.68 mL/min (ref >60.00)
Glucose, Bld: 95 mg/dL (ref 70–99)
Potassium: 4 mEq/L (ref 3.5–5.1)
Sodium: 137 mEq/L (ref 135–145)

## 2017-09-07 LAB — PROTIME-INR
INR: 1 ratio (ref 0.8–1.0)
Prothrombin Time: 12.1 s (ref 9.6–13.1)

## 2017-09-07 LAB — CBC
HCT: 52.3 % — ABNORMAL HIGH (ref 39.0–52.0)
Hemoglobin: 17.2 g/dL — ABNORMAL HIGH (ref 13.0–17.0)
MCHC: 33 g/dL (ref 30.0–36.0)
MCV: 86.5 fl (ref 78.0–100.0)
Platelets: 265 10*3/uL (ref 150.0–400.0)
RBC: 6.04 Mil/uL — ABNORMAL HIGH (ref 4.22–5.81)
RDW: 14.5 % (ref 11.5–15.5)
WBC: 6.8 10*3/uL (ref 4.0–10.5)

## 2017-09-07 MED ORDER — BENZONATATE 200 MG PO CAPS: 200.0000 mg | ORAL_CAPSULE | Freq: Three times a day (TID) | ORAL | 1 refills | Status: DC | PRN

## 2017-09-07 NOTE — Patient Instructions (Signed)
CXR today looked fine Checking labs- will call if anything is concerning   Follow up in 1-2 weeks Monitor for further bleeding, fever or sob  Delsym cough syrup (over the counter) - take twice a day Tessalon perles - take as needed for cough three times a day Continue Prevacid twice a day Add zantac at night x 1 week

## 2017-09-07 NOTE — Progress Notes (Signed)
@Patient  ID: Elijah Marshall, male    DOB: 1950-12-30, 67 y.o.   MRN: [id number]  Chief Complaint  Patient presents with  . Acute Visit    this AM coughed up bright [id number]    Referring provider: [name], MD  HPI: 67 year old male, former smoker quit in 2013. Hx COPD GOLD III, stage 1A non-small cell carcinoma of right lung. Patient of Dr. Melvyn Novas, last seen on 08/27/17. Treated for URI with zpack and prednisone.  - Spirometry  03/01/12  FEV1  1.63 - Spirometry   09/05/13 FEV1  1.34 (46%) ratio 44  - PFTs   10/29/2013    FEV1    1.70 (62%) ratio 50% no better after ssaba and DLCO 48%  - 09/05/2013  Walked RA x 3 laps @ fast pace @  185 ft each stopped due to end of study, fast pace,sat 89% at end  - 01/06/14 gradual worsening on anoro plus increase need for saba  - 01/28/2014   > try spiriva respimat/ symbicort 160 2bid > changed to symbicort 160 2bid only on 01/28/14  - 02/25/2014   90%   > changed to symbicort 80 2bid since hoarse on 160 and clear on exam  - 11/19/2015 rechallenge with symbicort 160 Take 2 puffs first thing in am and then another 2 puffs about 12 hours later.  - PFT's  01/10/2016  FEV1 1.38 (51 % ) ratio 47  p 1 % improvement from saba p symb 160  prior to study with DLCO  43/41  % corrects to 47  % for alv volume   - 02/21/2017  After extensive coaching inhaler device  effectiveness =    90% vs baseline < 25%  - 02/21/2017 changed to bevespi 2 bid  - 04/19/2017  Demonstrated elipta > try anoro to see if cost less/ works as well  - PFT's  05/22/2017  FEV1 1.21  (46 % ) ratio 46  p 1 % improvement from saba p anoro and albuterol 1 h prior to study with DLCO  38 % corrects to 49 % for alv volume   - changed to Incruse 06/04/17 due to tremors > resolved  - 08/27/2017  After extensive coaching inhaler device  effectiveness =    90%   09/08/2017 Presents today with complaints of 2 episodes of hemoptysis this morning. Described as bright red blood. Somewhat forceful cough this morning  trying to get mucus up. Cleared as the day went on. No further episodes. He did recently have a URI. Denies fever, weight loss.   Allergies  Allergen Reactions  . Aspirin Nausea And Vomiting  . Morphine And Related Itching    Immunization History  Administered Date(s) Administered  . Influenza, High Dose Seasonal PF 11/21/2016  . Influenza,inj,Quad PF,6+ Mos 01/15/2015  . Influenza-Unspecified 11/02/2015  . Pneumococcal Conjugate-13 04/17/2017  . Pneumococcal Polysaccharide-23 07/26/2012    Past Medical History:  Diagnosis Date  . Allergy   . Arthritis   . BPH with obstruction/lower urinary tract symptoms 2016   Nocturia  . Cancer [facility])    unsure at this time  . Cancer of lower lobe of right lung ([place])   . COPD (chronic obstructive pulmonary disease) ([place])    per patient's health survey - he put a ?  . Depression   . Erectile dysfunction due to arterial insufficiency   . GERD (gastroesophageal reflux disease)   . Hyperlipidemia   . Hypertension   . Non-small cell carcinoma  of lung, right ([place]) 06/24/2015  . Shortness of breath   . Ulcer     Tobacco History: Social History   Tobacco Use  Smoking Status Former Smoker  . Packs/day: 1.00  . Years: 35.00  . Pack years: 35.00  . Types: Cigarettes  . Last attempt to quit: 09/24/2011  . Years since quitting: 5.9  Smokeless Tobacco Never Used   Counseling given: Not Answered   Outpatient Medications Prior to Visit  Medication Sig Dispense Refill  . albuterol (VENTOLIN HFA) 108 (90 Base) MCG/ACT inhaler INHALE USING 2 PUFFS EVERY 4 HOURS AS NEEDED ONLY IF YOUR CAN'T CATCH YOUR BREATH 18 Inhaler 0  . bisoprolol-hydrochlorothiazide (ZIAC) 2.5-6.25 MG tablet TAKE 1 TABLET BY MOUTH EVERY DAY 30 tablet 1  . clonazePAM (KLONOPIN) 1 MG tablet Take 1/2 tablet as needed twice a day for anxiety and 1-2 tablets at night as needed for sleep 60 tablet 1  . Guaifenesin (MUCINEX MAXIMUM STRENGTH) 1200 MG TB12 Take 1 tablet (1,200 mg  total) by mouth every 12 (twelve) hours as needed. 14 tablet 1  . lansoprazole (PREVACID) 30 MG capsule Take 30 mg by mouth 2 (two) times daily.    Marland Kitchen levocetirizine (XYZAL) 5 MG tablet Take 1 tablet (5 mg total) by mouth every evening. 90 tablet 3  . OXYGEN 2lpm with exertion only    . polyethylene glycol (MIRALAX) packet Take 17 g by mouth daily.    . pravastatin (PRAVACHOL) 40 MG tablet Take 1 tablet (40 mg total) by mouth at bedtime. 90 tablet 3  . ranitidine (ZANTAC) 150 MG capsule Take 150 mg by mouth every evening. As needed    . traMADol (ULTRAM) 50 MG tablet Take 1-2 tablets (50-100 mg total) by mouth every 8 (eight) hours as needed. for pain 90 tablet 2  . umeclidinium bromide (INCRUSE ELLIPTA) 62.5 MCG/INH AEPB Inhale 1 puff into the lungs daily. 1 each 3  . methocarbamol (ROBAXIN) 500 MG tablet Take 1 tablet (500 mg total) by mouth 2 (two) times daily. (Patient not taking: Reported on 09/07/2017) 20 tablet 0  . senna (SENOKOT) 8.6 MG TABS tablet Take 2 tablets (17.2 mg total) by mouth at bedtime. (Patient not taking: Reported on 09/07/2017) 120 each 0  . azithromycin (ZITHROMAX) 250 MG tablet Take 2 on day one then 1 daily x 4 days 6 tablet 0  . predniSONE (DELTASONE) 10 MG tablet Take  4 each am x 2 days,   2 each am x 2 days,  1 each am x 2 days and stop 14 tablet 0   No facility-administered medications prior to visit.     Review of Systems  Review of Systems  Constitutional: Negative.   HENT: Negative.   Respiratory: Positive for cough and chest tightness. Negative for shortness of breath and wheezing.   Cardiovascular: Negative.   Skin: Negative.   Hematological: Negative.    Physical Exam  BP 140/80 (BP Location: Left Arm, Cuff Size: Normal)   Pulse 91   Ht 5\' 8"  (1.727 m)   Wt 176 lb 6.4 oz (80 kg)   SpO2 91%   BMI 26.82 kg/m  Physical Exam  Constitutional: He is oriented to person, place, and time. He appears well-developed and well-nourished.  HENT:  Head:  Normocephalic and atraumatic.  Eyes: Pupils are equal, round, and reactive to light. EOM are normal.  Neck: Normal range of motion. Neck supple.  Cardiovascular: Normal rate, regular rhythm, normal heart sounds and intact distal pulses.  Pulmonary/Chest: Effort normal and breath sounds normal. No respiratory distress. He has no wheezes.  Abdominal: Soft. Bowel sounds are normal. There is no tenderness.  Neurological: He is alert and oriented to person, place, and time.  Skin: Skin is warm and dry. No rash noted. No erythema.  Psychiatric: He has a normal mood and affect. His behavior is normal. Judgment normal.     Lab Results:  CBC    Component Value Date/Time   WBC 6.8 09/07/2017 1632   RBC 6.04 (H) 09/07/2017 1632   HGB 17.2 (H) 09/07/2017 1632   HGB 16.2 12/25/2016 1010   HCT 52.3 (H) 09/07/2017 1632   HCT 48.6 12/25/2016 1010   PLT 265.0 09/07/2017 1632   PLT 224 12/25/2016 1010   MCV 86.5 09/07/2017 1632   MCV 88.2 12/25/2016 1010   MCH 29.2 06/04/2017 1545   MCHC 33.0 09/07/2017 1632   RDW 14.5 09/07/2017 1632   RDW 13.6 12/25/2016 1010   LYMPHSABS 2.0 06/04/2017 1545   LYMPHSABS 2.1 12/25/2016 1010   MONOABS 0.5 06/04/2017 1545   MONOABS 0.7 12/25/2016 1010   EOSABS 0.1 06/04/2017 1545   EOSABS 0.1 12/25/2016 1010   BASOSABS 0.0 06/04/2017 1545   BASOSABS 0.0 12/25/2016 1010    BMET    Component Value Date/Time   NA 137 09/07/2017 1632   NA 141 12/25/2016 1010   K 4.0 09/07/2017 1632   K 3.6 12/25/2016 1010   CL 99 09/07/2017 1632   CO2 31 09/07/2017 1632   CO2 28 12/25/2016 1010   GLUCOSE 95 09/07/2017 1632   GLUCOSE 81 12/25/2016 1010   BUN 7 09/07/2017 1632   BUN 6.1 (L) 12/25/2016 1010   CREATININE 1.15 09/07/2017 1632   CREATININE 1.1 12/25/2016 1010   CALCIUM 9.7 09/07/2017 1632   CALCIUM 9.6 12/25/2016 1010   GFRNONAA >60 06/04/2017 1545   GFRAA >60 06/04/2017 1545    BNP No results found for: BNP  ProBNP No results found for:  PROBNP  Imaging: Dg Chest 2 View  Result Date: 09/07/2017 CLINICAL DATA:  Hemoptysis EXAM: CHEST - 2 VIEW COMPARISON:  05/22/2017, CT chest 12/25/2016 FINDINGS: Hyperinflation with emphysematous disease. Pleural thickening, no change. Postsurgical changes in the right lower lung. No acute airspace disease. Normal heart size. No pneumothorax. IMPRESSION: No active cardiopulmonary disease. Hyperinflation with emphysematous disease Electronically Signed   By: Donavan Foil M.D.   On: 09/07/2017 15:52     Assessment & Plan:   Cough with hemoptysis - Hemoptysis x2 this morning, no further episodes  - Recent URI last week  - CXR today showing no active cardiopulmonary disease - Labs unremarkable, INR wnl  - Treat cough with delsym, tessalon perles - FU in 1-2 weeks to ensure no further episodes   Non-small cell carcinoma of lung, right ([place]) - Follows with Dr. Julien Nordmann  - CT chest 12/2016 showing postsurgical and post treatment changes in the right hemithorax without evidence of recurrent or metastatic disease. - Plan repeat in 1 year, scheduled for 12/2017      Martyn Ehrich, NP 09/08/2017

## 2017-09-07 NOTE — Telephone Encounter (Signed)
Spoke with the pt  He states when he coughed this morning he produced some bright red blood  He is asking if he should be concerned  I advised he should come in for eval and possible cxr based on what Beth thinks  Appt scheduled at 3:45 pm today

## 2017-09-08 ENCOUNTER — Encounter: Payer: Self-pay | Admitting: Primary Care

## 2017-09-08 DIAGNOSIS — R042 Hemoptysis: Secondary | ICD-10-CM | POA: Insufficient documentation

## 2017-09-08 NOTE — Assessment & Plan Note (Signed)
-   Follows with Dr. Julien Nordmann  - CT chest 12/2016 showing postsurgical and post treatment changes in the right hemithorax without evidence of recurrent or metastatic disease. - Plan repeat in 1 year, scheduled for 12/2017

## 2017-09-08 NOTE — Assessment & Plan Note (Signed)
-   Hemoptysis x2 this morning, no further episodes  - Recent URI last week  - CXR today showing no active cardiopulmonary disease - Labs unremarkable, INR wnl  - Treat cough with delsym, tessalon perles - FU in 1-2 weeks to ensure no further episodes

## 2017-09-16 ENCOUNTER — Other Ambulatory Visit: Payer: Self-pay | Admitting: Family Medicine

## 2017-09-18 ENCOUNTER — Ambulatory Visit: Payer: Medicare Other | Admitting: Gastroenterology

## 2017-09-18 ENCOUNTER — Other Ambulatory Visit: Payer: Self-pay | Admitting: Family Medicine

## 2017-09-18 NOTE — Telephone Encounter (Signed)
tramadol refill Last Refill:05/20/17 # 90 2 RF Last OV: 05/20/17 PCP: Dr Brigitte Pulse Pharmacy:[facility]

## 2017-09-20 ENCOUNTER — Other Ambulatory Visit: Payer: Self-pay | Admitting: Family Medicine

## 2017-09-20 NOTE — Telephone Encounter (Signed)
tramadol refill Last Refill:05/20/17 # 90 tab 2 RF Last OV: 07/19/17 PCP: Dr. Brigitte Pulse Pharmacy:[facility].

## 2017-09-27 ENCOUNTER — Encounter: Payer: Self-pay | Admitting: Adult Health

## 2017-09-27 ENCOUNTER — Ambulatory Visit (INDEPENDENT_AMBULATORY_CARE_PROVIDER_SITE_OTHER): Payer: Medicare Other | Admitting: Adult Health

## 2017-09-27 DIAGNOSIS — J449 Chronic obstructive pulmonary disease, unspecified: Secondary | ICD-10-CM

## 2017-09-27 DIAGNOSIS — J9611 Chronic respiratory failure with hypoxia: Secondary | ICD-10-CM

## 2017-09-27 DIAGNOSIS — C3491 Malignant neoplasm of unspecified part of right bronchus or lung: Secondary | ICD-10-CM | POA: Diagnosis not present

## 2017-09-27 MED ORDER — LANSOPRAZOLE 30 MG PO CPDR: 30.0000 mg | DELAYED_RELEASE_CAPSULE | Freq: Two times a day (BID) | ORAL | 1 refills | Status: DC

## 2017-09-27 NOTE — Progress Notes (Signed)
@Patient  ID: Elijah Marshall, male    DOB: Dec 07, 1950, 67 y.o.   MRN: [id number]  Chief Complaint  Patient presents with  . Follow-up    COPD     Referring provider: [name], MD  HPI: 67 year old male former smoker, quit in 2013, followed for gold 3 COPD.  History of stage Ia non-small cell carcinoma of the right lung status post resection  TEST /Events  Spirometry  03/01/12  FEV1  1.63 - Spirometry   09/05/13 FEV1  1.34 (46%) ratio 44  - PFTs   10/29/2013    FEV1    1.70 (62%) ratio 50% no better after ssaba and DLCO 48%  - 09/05/2013  Walked RA x 3 laps @ fast pace @  185 ft each stopped due to end of study, fast pace,sat 89% at end  - PFT's  01/10/2016  FEV1 1.38 (51 % ) ratio 47  p 1            % improvement from saba p symb 160  prior to study with DLCO  43/41  % corrects to 47  % for alv volume   - PFT's  05/22/2017  FEV1 1.21  (46 % ) ratio 46  p 1 % improvement from saba p anoro and albuterol 1 h prior to study with DLCO  38 % corrects to 49 % for alv volume   - changed (from [facility] )  to Incruse 06/04/17 due to tremors > resolved   09/27/2017 Follow up : COPD , O2 RF , Med Review  Patient presents for a one-month follow-up.  For COPD and medication review.  Patient has underlying Gold 3 COPD with an FEV1 at 46% ratio 46.  He remains on INCRUSE daily.  Previously was on Anoro but had to stop due to tremors.  Feels his breathing is doing okay.  He does get winded with heavy activity.  He does not exercise on a regular basis.  Does try to walk on occasion.  He denies any flare cough or wheezing.  Patient was seen in the office 3 weeks ago after an isolated episode of hemoptysis.  This resolved with no recurrence.  Chest x-ray and labs were unremarkable.  He remains on oxygen 2 L with activity.  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  We reviewed all his medications and organize them into a medication count with patient education appears to be taking his  medications correctly.  Patient has a history of lung cancer status post resection.  He has an upcoming CT chest in November for serial follow-up.  He is followed by oncology with Dr. Earlie Server  Pneumovax and Prevnar 13 are up-to-date.  We discussed flu shot for this season. Allergies  Allergen Reactions  . Aspirin Nausea And Vomiting  . Morphine And Related Itching    Immunization History  Administered Date(s) Administered  . Influenza, High Dose Seasonal PF 11/21/2016  . Influenza,inj,Quad PF,6+ Mos 01/15/2015  . Influenza-Unspecified 11/02/2015  . Pneumococcal Conjugate-13 04/17/2017  . Pneumococcal Polysaccharide-23 07/26/2012    Past Medical History:  Diagnosis Date  . Allergy   . Arthritis   . BPH with obstruction/lower urinary tract symptoms 2016   Nocturia  . Cancer [facility])    unsure at this time  . Cancer of lower lobe of right lung ([place])   . COPD (chronic obstructive pulmonary disease) ([employer])    per patient's health survey - he put a ?  .  Depression   . Erectile dysfunction due to arterial insufficiency   . GERD (gastroesophageal reflux disease)   . Hyperlipidemia   . Hypertension   . Non-small cell carcinoma of lung, right ([place]) 06/24/2015  . Shortness of breath   . Ulcer     Tobacco History: Social History   Tobacco Use  Smoking Status Former Smoker  . Packs/day: 1.00  . Years: 35.00  . Pack years: 35.00  . Types: Cigarettes  . Last attempt to quit: 09/24/2011  . Years since quitting: 6.0  Smokeless Tobacco Never Used   Counseling given: Not Answered   Outpatient Medications Prior to Visit  Medication Sig Dispense Refill  . albuterol (VENTOLIN HFA) 108 (90 Base) MCG/ACT inhaler INHALE USING 2 PUFFS EVERY 4 HOURS AS NEEDED ONLY IF YOUR CAN'T CATCH YOUR BREATH 18 Inhaler 0  . benzonatate (TESSALON) 200 MG capsule Take 1 capsule (200 mg total) by mouth 3 (three) times daily as needed for cough. 30 capsule 1  . bisoprolol-hydrochlorothiazide (ZIAC)  2.5-6.25 MG tablet TAKE 1 TABLET BY MOUTH EVERY DAY 30 tablet 1  . clonazePAM (KLONOPIN) 1 MG tablet Take 1/2 tablet as needed twice a day for anxiety and 1-2 tablets at night as needed for sleep 60 tablet 1  . Guaifenesin (MUCINEX MAXIMUM STRENGTH) 1200 MG TB12 Take 1 tablet (1,200 mg total) by mouth every 12 (twelve) hours as needed. 14 tablet 1  . lansoprazole (PREVACID) 30 MG capsule Take 30 mg by mouth 2 (two) times daily.    Marland Kitchen levocetirizine (XYZAL) 5 MG tablet Take 1 tablet (5 mg total) by mouth every evening. 90 tablet 3  . methocarbamol (ROBAXIN) 500 MG tablet Take 1 tablet (500 mg total) by mouth 2 (two) times daily. 20 tablet 0  . OXYGEN 2lpm with exertion only    . polyethylene glycol (MIRALAX) packet Take 17 g by mouth daily.    . pravastatin (PRAVACHOL) 40 MG tablet Take 1 tablet (40 mg total) by mouth at bedtime. 90 tablet 3  . ranitidine (ZANTAC) 150 MG capsule Take 150 mg by mouth every evening. As needed    . senna (SENOKOT) 8.6 MG TABS tablet Take 2 tablets (17.2 mg total) by mouth at bedtime. 120 each 0  . traMADol (ULTRAM) 50 MG tablet Take 1-2 tablets (50-100 mg total) by mouth every 8 (eight) hours as needed for moderate pain. **NEED F/U OFFICE VISIT FURTHER REFILLS** 90 tablet 2  . umeclidinium bromide (INCRUSE ELLIPTA) 62.5 MCG/INH AEPB Inhale 1 puff into the lungs daily. 1 each 3   No facility-administered medications prior to visit.      Review of Systems  Constitutional:   No  weight loss, night sweats,  Fevers, chills, +fatigue, or  lassitude.  HEENT:   No headaches,  Difficulty swallowing,  Tooth/dental problems, or  Sore throat,                No sneezing, itching, ear ache, nasal congestion, post nasal drip,   CV:  No chest pain,  Orthopnea, PND, swelling in lower extremities, anasarca, dizziness, palpitations, syncope.   GI  No heartburn, indigestion, abdominal pain, nausea, vomiting, diarrhea, change in bowel habits, loss of appetite, bloody stools.    Resp: No shortness of breath with exertion or at rest.  No excess mucus, no productive cough,  No non-productive cough,  No coughing up of blood.  No change in color of mucus.  No wheezing.  No chest wall deformity  Skin: no rash or  lesions.  GU: no dysuria, change in color of urine, no urgency or frequency.  No flank pain, no hematuria   MS:  +back and knee pain -chronic      Physical Exam  BP 130/84 (BP Location: Left Arm, Cuff Size: Normal)   Pulse 83   Ht 5\' 7"  (1.702 m)   Wt 175 lb (79.4 kg)   SpO2 93%   BMI 27.41 kg/m   GEN: A/Ox3; pleasant , NAD, elderly   HEENT:  [place]/AT,  EACs-clear, TMs-wnl, NOSE-clear, THROAT-clear, no lesions, no postnasal drip or exudate noted.   NECK:  Supple w/ fair ROM; no JVD; normal carotid impulses w/o bruits; no thyromegaly or nodules palpated; no lymphadenopathy.    RESP diminished breath sounds in the bases  no accessory muscle use, no dullness to percussion  CARD:  RRR, no m/r/g, no peripheral edema, pulses intact, no cyanosis or clubbing.  GI:   Soft & nt; nml bowel sounds; no organomegaly or masses detected.   Musco: Warm bil, no deformities or joint swelling noted.   Neuro: alert, no focal deficits noted.    Skin: Warm, no lesions or rashes    Lab Results:  CBC   BNP No results found for: BNP  ProBNP No results found for: PROBNP  Imaging: Dg Chest 2 View  Result Date: 09/07/2017 CLINICAL DATA:  Hemoptysis EXAM: CHEST - 2 VIEW COMPARISON:  05/22/2017, CT chest 12/25/2016 FINDINGS: Hyperinflation with emphysematous disease. Pleural thickening, no change. Postsurgical changes in the right lower lung. No acute airspace disease. Normal heart size. No pneumothorax. IMPRESSION: No active cardiopulmonary disease. Hyperinflation with emphysematous disease Electronically Signed   By: Donavan Foil M.D.   On: 09/07/2017 15:52     Assessment & Plan:   COPD GOLD III Compensated on present regimen  Patient's medications were  reviewed today and patient education was given. Computerized medication calendar was adjusted/completed   Plan  Patient Instructions  Continue on current regimen  Follow med calendar closely and bring to each visit .  Follow with Dr. Melvyn Novas  In 3 months and .As needed   Activity as tolerated Continue on INCRUSE daily , rinse after use. - assistance paperwork today  Get flu shot next month       Non-small cell carcinoma of lung, right ([place]) Cont serial CT and oncology follow up   Chronic respiratory failure with hypoxia ([place]) Cont on oxygen with activity      Rexene Edison, NP 09/27/2017

## 2017-09-27 NOTE — Assessment & Plan Note (Signed)
Compensated on present regimen  Patient's medications were reviewed today and patient education was given. Computerized medication calendar was adjusted/completed   Plan  Patient Instructions  Continue on current regimen  Follow med calendar closely and bring to each visit .  Follow with Dr. Melvyn Novas  In 3 months and .As needed   Activity as tolerated Continue on INCRUSE daily , rinse after use. - assistance paperwork today  Get flu shot next month

## 2017-09-27 NOTE — Assessment & Plan Note (Signed)
Cont serial CT and oncology follow up

## 2017-09-27 NOTE — Addendum Note (Signed)
Addended by: Parke Poisson E on: 09/27/2017 04:43 PM   Modules accepted: Orders

## 2017-09-27 NOTE — Assessment & Plan Note (Signed)
Cont on oxygen with activity

## 2017-09-27 NOTE — Patient Instructions (Signed)
Continue on current regimen  Follow med calendar closely and bring to each visit .  Follow with Dr. Melvyn Novas  In 3 months and .As needed   Activity as tolerated Continue on INCRUSE daily , rinse after use. - assistance paperwork today  Get flu shot next month

## 2017-10-01 NOTE — Progress Notes (Signed)
Chart and office note reviewed in detail  > agree with a/p as outlined    

## 2017-10-01 NOTE — Addendum Note (Signed)
Addended by: Parke Poisson E on: 10/01/2017 04:16 PM   Modules accepted: Orders

## 2017-10-06 ENCOUNTER — Telehealth: Payer: Self-pay | Admitting: Family Medicine

## 2017-10-06 NOTE — Telephone Encounter (Signed)
Pt. Called requesting refill of levocetirizine. Pt. Asserts he has not had it in two years and the script ran out. Pt. Advised he may need to come in for an OV for refill.

## 2017-10-10 NOTE — Telephone Encounter (Signed)
Appointment has been scheduled for pt to be evaluated for medication.

## 2017-10-11 ENCOUNTER — Other Ambulatory Visit: Payer: Self-pay | Admitting: Family Medicine

## 2017-10-15 ENCOUNTER — Other Ambulatory Visit: Payer: Self-pay | Admitting: Internal Medicine

## 2017-10-15 MED ORDER — UMECLIDINIUM BROMIDE 62.5 MCG/INH IN AEPB: 1.0000 | INHALATION_SPRAY | Freq: Every day | RESPIRATORY_TRACT | 11 refills | Status: DC

## 2017-10-18 ENCOUNTER — Telehealth: Payer: Self-pay | Admitting: Family Medicine

## 2017-10-18 NOTE — Telephone Encounter (Signed)
Called pt. To rescheudle visit on 11/15/17. Rescheduled with pt.

## 2017-10-30 NOTE — Telephone Encounter (Signed)
Called pt to try and reschedule his appt with Dr. Brigitte Pulse for 11/15/17. Patient states that he has been out of his levocetirizine.for 3 -4 weeks now. He would like a call back to advise if he needs to reschedule an appt with a different provider of if he may get  A refill until he is able to get an appt with Dr. Brigitte Pulse (which will not be until December).   Please advise by calling patient back at [phone]  Thank you!

## 2017-10-31 NOTE — Telephone Encounter (Signed)
Patient was advised we have never filled this medication and he would need to come in to see another provider. Patient voiced understanding and stated he would call back

## 2017-11-09 ENCOUNTER — Encounter: Payer: Self-pay | Admitting: Gastroenterology

## 2017-11-09 ENCOUNTER — Encounter

## 2017-11-09 ENCOUNTER — Telehealth: Payer: Self-pay | Admitting: Internal Medicine

## 2017-11-09 ENCOUNTER — Encounter (INDEPENDENT_AMBULATORY_CARE_PROVIDER_SITE_OTHER): Payer: Self-pay

## 2017-11-09 ENCOUNTER — Ambulatory Visit (INDEPENDENT_AMBULATORY_CARE_PROVIDER_SITE_OTHER): Payer: Medicare Other | Admitting: Gastroenterology

## 2017-11-09 VITALS — BP 122/78 | HR 74 | Ht 69.0 in | Wt 174.2 lb

## 2017-11-09 DIAGNOSIS — K625 Hemorrhage of anus and rectum: Secondary | ICD-10-CM

## 2017-11-09 DIAGNOSIS — K5909 Other constipation: Secondary | ICD-10-CM

## 2017-11-09 DIAGNOSIS — Z86010 Personal history of colonic polyps: Secondary | ICD-10-CM | POA: Diagnosis not present

## 2017-11-09 MED ORDER — PEG-KCL-NACL-NASULF-NA ASC-C 140 G PO SOLR: 140.0000 g | ORAL | 0 refills | Status: DC

## 2017-11-09 NOTE — Patient Instructions (Signed)
If you are age 66 or older, your body mass index should be between 23-30. Your Body mass index is 25.73 kg/m. If this is out of the aforementioned range listed, please consider follow up with your Primary Care Provider.  If you are age 59 or younger, your body mass index should be between 19-25. Your Body mass index is 25.73 kg/m. If this is out of the aformentioned range listed, please consider follow up with your Primary Care Provider.   You have been scheduled for a colonoscopy. Please follow written instructions given to you at your visit today.  Please pick up your prep supplies at the pharmacy within the next 1-3 days. If you use inhalers (even only as needed), please bring them with you on the day of your procedure. Your physician has requested that you go to www.startemmi.com and enter the access code given to you at your visit today. This web site gives a general overview about your procedure. However, you should still follow specific instructions given to you by our office regarding your preparation for the procedure.  It was a pleasure to see you today!  Dr. Loletha Carrow

## 2017-11-09 NOTE — Telephone Encounter (Signed)
Patient came to ask for samples since he was in the building to see GI. Patient left before staff could get to the lobby to tell him we don't have any samples currently. Would have offered him patient assistance forms. Nothing further needed at this time.

## 2017-11-09 NOTE — Progress Notes (Signed)
[facility] Consult Note:  History: Elijah Marshall 11/09/2017  Referring physician: [name], MD  Reason for consult/chief complaint: Rectal Bleeding; Rectal Pain (with BM); Constipation; and Hemorrhoids   Subjective  HPI:  This is a very pleasant man referred by primary care for rectal bleeding and rectal pain.  He has had chronic constipation, which he attributes to chronic tramadol use for his pain syndrome.  In the last few months he has had multiple episodes of blood on the paper, and a few episodes of frank rectal bleeding, most recently a few weeks ago.  He became concerned because he was overdue for his surveillance colonoscopy. Report of a colonoscopy by Elijah Marshall from May 2014 was reviewed.  That appears to been a screening exam for the patient, and several adenomatous polyps were removed.  Primary care note 07/19/2017, patient reported that a prostate exam during urology visit spring noted "rectal narrowing".  He has recently had some blood on the toilet paper some recent straining, occasional use of MiraLAX and Senokot.  Elijah Marshall's mother had colorectal cancer.  ROS:  Review of Systems  Constitutional: Negative for appetite change and unexpected weight change.  HENT: Negative for mouth sores and voice change.   Eyes: Negative for pain and redness.  Respiratory: Positive for shortness of breath. Negative for cough.   Cardiovascular: Negative for chest pain and palpitations.  Genitourinary: Negative for dysuria and hematuria.  Musculoskeletal: Positive for arthralgias and back pain. Negative for myalgias.  Skin: Negative for pallor and rash.  Neurological: Negative for weakness and headaches.  Hematological: Negative for adenopathy.     Past Medical History: Past Medical History:  Diagnosis Date  . Allergy   . Arthritis   . BPH with obstruction/lower urinary tract symptoms 2016   Nocturia  . Cancer [facility])    unsure at this time  .  Cancer of lower lobe of right lung ([place])   . COPD (chronic obstructive pulmonary disease) ([place])    per patient's health survey - he put a ?  . Depression   . Erectile dysfunction due to arterial insufficiency   . GERD (gastroesophageal reflux disease)   . Hyperlipidemia   . Hypertension   . Non-small cell carcinoma of lung, right ([place]) 06/24/2015  . Shortness of breath   . Ulcer    Most recent pulmonary and cardiothoracic surgery notes indicate COPD Gold 3, stage Ia non-small cell carcinoma removed surgically in 2017. He uses his inhaler occasionally, and rarely needs supplemental oxygen if he exerts himself heavily.  Past Surgical History: Past Surgical History:  Procedure Laterality Date  . ANTERIOR FUSION LUMBAR SPINE  07/25/2012  . ANTERIOR LAT LUMBAR FUSION Left 07/25/2012   Procedure: ANTERIOR LATERAL LUMBAR FUSION 1 LEVEL;  Surgeon: [name], MD;  Location: [place];  Service: Orthopedics;  Laterality: Left;  Left sided lumbar 3-4 lateral interbody fusion with allograft and instrumentation  . COLONOSCOPY  14   polyps rem  . JOINT REPLACEMENT Right 2010  . NODE DISSECTION Right 05/24/2015   Procedure: NODE DISSECTION;  Surgeon: [name], MD;  Location: [place];  Service: Thoracic;  Laterality: Right;  . SEGMENTECOMY Right 05/24/2015   Procedure: RIGHT LOWER LOBE SUPERIOR SEGMENTECTOMY;  Surgeon: [name], MD;  Location: [place];  Service: Thoracic;  Laterality: Right;  . [name]  2012  . VIDEO ASSISTED THORACOSCOPY (VATS)/WEDGE RESECTION Right 05/24/2015   Procedure: RIGHT VIDEO ASSISTED THORACOSCOPY (VATS)/ RIGHTR LOWER LOBE WEDGE  RESECTION;  Surgeon: [name], MD;  Location: [facility] OR;  Service: Thoracic;  Laterality: Right;     Family History: Family History  Problem Relation Age of Onset  . Colon cancer Mother   . Heart disease Father   . Diabetes Sister   . Hypertension Sister   . Cancer Sister   . Hypertension Brother   .  Heart disease Maternal Grandmother        heart attack    Social History: Social History   Socioeconomic History  . Marital status: Married    Spouse name: Not on file  . Number of children: Not on file  . Years of education: Not on file  . Highest education level: High school graduate  Occupational History  . Occupation: truck Government social research officer: [employer]  . Financial resource strain: Not hard at all  . Food insecurity:    Worry: Never true    Inability: Never true  . Transportation needs:    Medical: No    Non-medical: No  Tobacco Use  . Smoking status: Former Smoker    Packs/day: 1.00    Years: 35.00    Pack years: 35.00    Types: Cigarettes    Last attempt to quit: 09/24/2011    Years since quitting: 6.1  . Smokeless tobacco: Never Used  Substance and Sexual Activity  . Alcohol use: No    Alcohol/week: 0.0 standard drinks    Comment: weekly  . Drug use: No  . Sexual activity: Not on file  Lifestyle  . Physical activity:    Days per week: 4 days    Minutes per session: 30 min  . Stress: To some extent  Relationships  . Social connections:    Talks on phone: More than three times a week    Gets together: More than three times a week    Attends religious service: More than 4 times per year    Active member of club or organization: No    Attends meetings of clubs or organizations: Never    Relationship status: Married  Other Topics Concern  . Not on file  Social History Narrative  . Not on file    Allergies: Allergies  Allergen Reactions  . Aspirin Nausea And Vomiting  . Morphine And Related Itching    Outpatient Meds: Current Outpatient Medications  Medication Sig Dispense Refill  . albuterol (VENTOLIN HFA) 108 (90 Base) MCG/ACT inhaler INHALE USING 2 PUFFS EVERY 4 HOURS AS NEEDED ONLY IF YOUR CAN'T CATCH YOUR BREATH 18 Inhaler 0  . bisoprolol-hydrochlorothiazide (ZIAC) 2.5-6.25 MG tablet TAKE 1 TABLET BY MOUTH EVERY  DAY 30 tablet 1  . clonazePAM (KLONOPIN) 1 MG tablet Take 1 mg by mouth 2 (two) times daily as needed for anxiety.    . docusate sodium (STOOL SOFTENER) 100 MG capsule Per bottle as needed    . Guaifenesin (MUCINEX MAXIMUM STRENGTH) 1200 MG TB12 Take 1 tablet (1,200 mg total) by mouth every 12 (twelve) hours as needed. 14 tablet 1  . lansoprazole (PREVACID) 30 MG capsule Take 1 capsule (30 mg total) by mouth 2 (two) times daily. 180 capsule 1  . levocetirizine (XYZAL) 5 MG tablet Take 5 mg by mouth daily as needed (drippy nose).    . OXYGEN 2lpm with exertion only    . pravastatin (PRAVACHOL) 40 MG tablet Take 1 tablet (40 mg total) by mouth at bedtime. 90 tablet 3  . ranitidine (ZANTAC) 150 MG  capsule Take 150 mg by mouth every evening.     . traMADol (ULTRAM) 50 MG tablet Take 1-2 tablets (50-100 mg total) by mouth every 8 (eight) hours as needed for moderate pain. **NEED F/U OFFICE VISIT FURTHER REFILLS** 90 tablet 2  . umeclidinium bromide (INCRUSE ELLIPTA) 62.5 MCG/INH AEPB Inhale 1 puff into the lungs daily. 1 each 11   No current facility-administered medications for this visit.       ___________________________________________________________________ Objective   Exam:  BP 122/78   Pulse 74   Ht 5\' 9"  (1.753 m)   Wt 174 lb 4 oz (79 kg)   BMI 25.73 kg/m    General: this is a(n) well-appearing man, breathing comfortably on room air and when ambulating.  Eyes: sclera anicteric, no redness  ENT: oral mucosa moist without lesions, no cervical or supraclavicular lymphadenopathy, good dentition  CV: RRR without murmur, S1/S2, no JVD, no peripheral edema  Resp: Doubly decreased breath sounds normal RR and effort noted  GI: soft, no tenderness, with active bowel sounds. No guarding or palpable organomegaly noted.  Skin; warm and dry, no rash or jaundice noted  Neuro: awake, alert and oriented x 3. Normal gross motor function and fluent speech Rectal: Normal external,  increased resting sphincter tone, no palpable internal lesions  Labs:  CBC Latest Ref Rng & Units 09/07/2017 06/04/2017 12/25/2016  WBC 4.0 - 10.5 K/uL 6.8 5.6 6.5  Hemoglobin 13.0 - 17.0 g/dL 17.2(H) 17.3(H) 16.2  Hematocrit 39.0 - 52.0 % 52.3(H) 51.8 48.6  Platelets 150.0 - 400.0 K/uL 265.0 255 224   Ascending and transverse colon polyps were tubular adenoma. One rectal polyp was tubular adenoma, the other was prolapsed tissue   Assessment: Encounter Diagnoses  Name Primary?  . Rectal bleeding Yes  . Chronic constipation   . Personal history of colonic polyps     Most likely benign anal bleeding, possibly hemorrhoidal, related to chronic constipation.  Must rule out neoplasia, especially since he is overdue for surveillance colonoscopy.   Plan:  Colonoscopy.  He is agreeable after discussion of procedure and risks.  The benefits and risks of the planned procedure were described in detail with the patient or (when appropriate) their health care proxy.  Risks were outlined as including, but not limited to, bleeding, infection, perforation, adverse medication reaction leading to cardiac or pulmonary decompensation, or pancreatitis (if ERCP).  The limitation of incomplete mucosal visualization was also discussed.  No guarantees or warranties were given.  Patient at increased risk for cardiopulmonary complications of procedure due to medical comorbidities.   Thank you for the courtesy of this consult.  Please call me with any questions or concerns.  Nelida Meuse III  CC: Elijah Knapp, MD

## 2017-11-13 ENCOUNTER — Other Ambulatory Visit: Payer: Self-pay | Admitting: Family Medicine

## 2017-11-13 NOTE — Telephone Encounter (Signed)
Requested Prescriptions  Pending Prescriptions Disp Refills  . bisoprolol-hydrochlorothiazide (ZIAC) 2.5-6.25 MG tablet [Pharmacy Med Name: BISOPROLOL-HCTZ 2.5-6.25 MG TB] 30 tablet 1    Sig: TAKE 1 TABLET BY MOUTH EVERY DAY     Cardiovascular: Beta Blocker + Diuretic Combos Passed - 11/13/2017  9:33 AM      Passed - K in normal range and within 180 days    Potassium  Date Value Ref Range Status  09/07/2017 4.0 3.5 - 5.1 mEq/L Final  12/25/2016 3.6 3.5 - 5.1 mEq/L Final         Passed - Na in normal range and within 180 days    Sodium  Date Value Ref Range Status  09/07/2017 137 135 - 145 mEq/L Final  12/25/2016 141 136 - 145 mEq/L Final         Passed - Cr in normal range and within 180 days    Creatinine  Date Value Ref Range Status  12/25/2016 1.1 0.7 - 1.3 mg/dL Final   Creatinine, Ser  Date Value Ref Range Status  09/07/2017 1.15 0.40 - 1.50 mg/dL Final         Passed - Ca in normal range and within 180 days    Calcium  Date Value Ref Range Status  09/07/2017 9.7 8.4 - 10.5 mg/dL Final  12/25/2016 9.6 8.4 - 10.4 mg/dL Final         Passed - Patient is not pregnant      Passed - Last BP in normal range    BP Readings from Last 1 Encounters:  11/09/17 122/78         Passed - Last Heart Rate in normal range    Pulse Readings from Last 1 Encounters:  11/09/17 74         Passed - Valid encounter within last 6 months    Recent Outpatient Visits          3 months ago Abnormal colonoscopy   [facility], Laurey Arrow, MD   6 months ago Essential hypertension   [facility], Laurey Arrow, MD   7 months ago Essential hypertension   [facility], Laurey Arrow, MD   8 months ago Chronic pain of both shoulders   [facility], Laurey Arrow, MD   2 years ago Essential hypertension   [facility], Laurey Arrow, MD      Future Appointments            In 1 month Melvyn Novas, Christena Deem, MD [facility]

## 2017-11-15 ENCOUNTER — Ambulatory Visit: Payer: Medicare Other | Admitting: Family Medicine

## 2017-11-16 ENCOUNTER — Other Ambulatory Visit: Payer: Self-pay

## 2017-11-16 ENCOUNTER — Ambulatory Visit (INDEPENDENT_AMBULATORY_CARE_PROVIDER_SITE_OTHER): Payer: Medicare Other | Admitting: Emergency Medicine

## 2017-11-16 ENCOUNTER — Encounter: Payer: Self-pay | Admitting: Emergency Medicine

## 2017-11-16 VITALS — BP 122/82 | HR 85 | Temp 98.0°F | Resp 16 | Ht 68.11 in | Wt 177.0 lb

## 2017-11-16 DIAGNOSIS — Z23 Encounter for immunization: Secondary | ICD-10-CM | POA: Diagnosis not present

## 2017-11-16 DIAGNOSIS — M25541 Pain in joints of right hand: Secondary | ICD-10-CM

## 2017-11-16 DIAGNOSIS — M25542 Pain in joints of left hand: Secondary | ICD-10-CM | POA: Diagnosis not present

## 2017-11-16 MED ORDER — LEVOCETIRIZINE DIHYDROCHLORIDE 5 MG PO TABS: 5.0000 mg | ORAL_TABLET | Freq: Every day | ORAL | 3 refills | Status: DC | PRN

## 2017-11-16 NOTE — Patient Instructions (Addendum)
If you have lab work done today you will be contacted with your lab results within the next 2 weeks.  If you have not heard from [place] then please contact us. The fastest way to get your results is to register for My Chart.   IF you received an x-ray today, you will receive an invoice from [facility]. Please contact [facility] at [phone] with questions or concerns regarding your invoice.   IF you received labwork today, you will receive an invoice from [facility]. Please contact [facility] at [phone] with questions or concerns regarding your invoice.   Our billing staff will not be able to assist you with questions regarding bills from these companies.  You will be contacted with the lab results as soon as they are available. The fastest way to get your results is to activate your My Chart account. Instructions are located on the last page of this paperwork. If you have not heard from [place] regarding the results in 2 weeks, please contact this office.     Joint Pain Joint pain, which is also called arthralgia, can be caused by many things. Joint pain often goes away when you follow your health care provider's instructions for relieving pain at home. However, joint pain can also be caused by conditions that require further treatment. Common causes of joint pain include:  Bruising in the area of the joint.  Overuse of the joint.  Wear and tear on the joints that occur with aging (osteoarthritis).  Various other forms of arthritis.  A buildup of a crystal form of uric acid in the joint (gout).  Infections of the joint (septic arthritis) or of the bone (osteomyelitis).  Your health care provider may recommend medicine to help with the pain. If your joint pain continues, additional tests may be needed to diagnose your condition. Follow these instructions at home: Watch your condition for any changes. Follow these instructions as directed to lessen the pain that  you are feeling.  Take medicines only as directed by your health care provider.  Rest the affected area for as long as your health care provider says that you should. If directed to do so, raise the painful joint above the level of your heart while you are sitting or lying down.  Do not do things that cause or worsen pain.  If directed, apply ice to the painful area: ? Put ice in a plastic bag. ? Place a towel between your skin and the bag. ? Leave the ice on for 20 minutes, 2-3 times per day.  Wear an elastic bandage, splint, or sling as directed by your health care provider. Loosen the elastic bandage or splint if your fingers or toes become numb and tingle, or if they turn cold and blue.  Begin exercising or stretching the affected area as directed by your health care provider. Ask your health care provider what types of exercise are safe for you.  Keep all follow-up visits as directed by your health care provider. This is important.  Contact a health care provider if:  Your pain increases, and medicine does not help.  Your joint pain does not improve within 3 days.  You have increased bruising or swelling.  You have a fever.  You lose 10 lb (4.5 kg) or more without trying. Get help right away if:  You are not able to move the joint.  Your fingers or toes become numb or they turn cold and blue. This information is  not intended to replace advice given to you by your health care provider. Make sure you discuss any questions you have with your health care provider. Document Released: 01/23/2005 Document Revised: 06/25/2015 Document Reviewed: 11/04/2013 Elsevier Interactive Patient Education  [employer].

## 2017-11-16 NOTE — Progress Notes (Signed)
Elijah Marshall 68 y.o.   Chief Complaint  Patient presents with  . Hand Pain    pt states his fingers are painfull x 3 months on both hands   . Medication Refill    Xyzal for allergies     HISTORY OF PRESENT ILLNESS: This is a 67 y.o. male complaining of pains to fingers in both hands for the past 5 to 6 months.  Denies any injuries.  No other significant symptoms.  HPI   Prior to Admission medications   Medication Sig Start Date End Date Taking? Authorizing Provider  albuterol (VENTOLIN HFA) 108 (90 Base) MCG/ACT inhaler INHALE USING 2 PUFFS EVERY 4 HOURS AS NEEDED ONLY IF YOUR CAN'T CATCH YOUR BREATH 06/11/17  Yes Elijah Rockers, [place]  bisoprolol-hydrochlorothiazide [facility]) 2.5-6.25 MG tablet TAKE 1 TABLET BY MOUTH EVERY DAY 11/13/17  Yes Elijah Knapp, [place]  clonazePAM (KLONOPIN) 1 MG tablet Take 1 mg by mouth 2 (two) times daily as needed for anxiety.   Yes Elijah Marshall  docusate sodium (STOOL SOFTENER) 100 MG capsule Per bottle as needed   Yes Elijah Marshall  Guaifenesin (MUCINEX MAXIMUM STRENGTH) 1200 MG TB12 Take 1 tablet (1,200 mg total) by mouth every 12 (twelve) hours as needed. 08/26/15  Yes Elijah Knapp, [place]  lansoprazole (PREVACID) 30 MG capsule Take 1 capsule (30 mg total) by mouth 2 (two) times daily. 09/27/17  Yes Elijah Marshall  levocetirizine (XYZAL) 5 MG tablet Take 1 tablet (5 mg total) by mouth daily as needed (drippy nose). 11/16/17 12/16/17 Yes Elijah Marshall  OXYGEN 2lpm with exertion only   Yes Elijah Marshall  PEG-KCl-NaCl-NaSulf-Na Asc-C (PLENVU) 140 g SOLR Take 140 g by mouth as directed. 11/09/17  Yes Elijah Marshall  pravastatin (PRAVACHOL) 40 MG tablet Take 1 tablet (40 mg total) by mouth at bedtime. 05/07/17  Yes Elijah Knapp, [place]  ranitidine (ZANTAC) 150 MG capsule Take 150 mg by mouth every evening.    Yes Elijah Marshall  traMADol (ULTRAM) 50 MG tablet Take 1-2 tablets (50-100 mg total) by mouth every 8  (eight) hours as needed for moderate pain. **NEED F/U OFFICE VISIT FURTHER REFILLS** 09/20/17  Yes Elijah Knapp, [place]  umeclidinium bromide (INCRUSE ELLIPTA) 62.5 MCG/INH AEPB Inhale 1 puff into the lungs daily. 10/15/17  Yes Elijah Rockers, [place]    Allergies  Allergen Reactions  . Aspirin Nausea And Vomiting  . Morphine And Related Itching    Patient Active Problem List   Diagnosis Date Noted  . Cough with hemoptysis 09/08/2017  . URI (upper respiratory infection) 08/27/2017  . Unexplained night sweats 04/30/2016  . Chronic respiratory failure with hypoxia ([place]) 11/19/2015  . Arthritis 11/11/2015  . Depression 11/11/2015  . Status post lobectomy of lung 07/05/2015  . Essential hypertension 07/05/2015  . Non-small cell carcinoma of lung, right ([place]) 06/24/2015  . Hyperlipidemia 04/20/2015  . Chronic infection of sinus 04/20/2015  . GERD (gastroesophageal reflux disease) 06/08/2014  . Chronic rhinitis 10/30/2012  . COPD GOLD III 09/26/2011    Past Medical History:  Diagnosis Date  . Allergy   . Arthritis   . BPH with obstruction/lower urinary tract symptoms 2016   Nocturia  . Cancer [facility])    unsure at this time  . Cancer of lower lobe of right lung ([place])   . COPD (chronic obstructive pulmonary disease) ([place])    per patient's health survey - he put a ?  .  Depression   . Erectile dysfunction due to arterial insufficiency   . GERD (gastroesophageal reflux disease)   . Hyperlipidemia   . Hypertension   . Non-small cell carcinoma of lung, right ([place]) 06/24/2015  . Shortness of breath   . Ulcer     Past Surgical History:  Procedure Laterality Date  . ANTERIOR FUSION LUMBAR SPINE  07/25/2012  . ANTERIOR LAT LUMBAR FUSION Left 07/25/2012   Procedure: ANTERIOR LATERAL LUMBAR FUSION 1 LEVEL;  Surgeon: Elijah Marshall;  Location: [place];  Service: Orthopedics;  Laterality: Left;  Left sided lumbar 3-4 lateral interbody fusion with allograft and instrumentation  . COLONOSCOPY   14   polyps rem  . JOINT REPLACEMENT Right 2010  . NODE DISSECTION Right 05/24/2015   Procedure: NODE DISSECTION;  Surgeon: Elijah Marshall;  Location: [place];  Service: Thoracic;  Laterality: Right;  . SEGMENTECOMY Right 05/24/2015   Procedure: RIGHT LOWER LOBE SUPERIOR SEGMENTECTOMY;  Surgeon: Elijah Marshall;  Location: [place];  Service: Thoracic;  Laterality: Right;  . [place]  2012  . VIDEO ASSISTED THORACOSCOPY (VATS)/WEDGE RESECTION Right 05/24/2015   Procedure: RIGHT VIDEO ASSISTED THORACOSCOPY (VATS)/ RIGHTR LOWER LOBE WEDGE RESECTION;  Surgeon: Elijah Marshall;  Location: [place];  Service: Thoracic;  Laterality: Right;    Social History   Socioeconomic History  . Marital status: Married    Spouse name: Elijah Marshall  . Number of children: Elijah Marshall  . Years of education: Elijah Marshall  . Highest education level: High school graduate  Occupational History  . 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 Financial resource strain: Elijah hard at all  . Food insecurity:    Worry: Never true    Inability: Never true  . Transportation needs:    Medical: No    Non-medical: No  Tobacco Use  . Smoking status: Former Smoker    Packs/day: 1.00    Years: 35.00    Pack years: 35.00    Types: Cigarettes    Last attempt to quit: 09/24/2011    Years since quitting: 6.1  . Smokeless tobacco: Never Used  Substance and Sexual Activity  . Alcohol use: No    Alcohol/week: 0.0 standard drinks    Comment: weekly  . Drug use: No  . Sexual activity: Elijah Marshall  Lifestyle  . Physical activity:    Days per week: 4 days    Minutes per session: 30 min  . Stress: To some extent  Relationships  . Social connections:    Talks on phone: More than three times a week    Gets together: More than three times a week    Attends religious service: More than 4 times per year    Active member of club or organization: No    Attends  meetings of clubs or organizations: Never    Relationship status: Married  . Intimate partner violence:    Fear of current or ex partner: No    Emotionally abused: No    Physically abused: No    Forced sexual activity: No  Other Topics Concern  . Elijah Marshall  Social History Narrative  . Elijah Marshall    Family History  Problem Relation Age of Onset  . Colon cancer Mother   . Heart disease Father   . Diabetes Sister   . Hypertension Sister   . Cancer Sister   .  Hypertension Brother   . Heart disease Maternal Grandmother        heart attack     Review of Systems  Constitutional: Negative.  Negative for chills and fever.  HENT: Negative for sore throat.   Eyes: Negative for discharge and redness.  Respiratory: Negative for cough and shortness of breath.   Cardiovascular: Negative for chest pain and palpitations.  Gastrointestinal: Negative for abdominal pain, diarrhea, nausea and vomiting.  Genitourinary: Negative.   Musculoskeletal: Positive for joint pain.  Skin: Negative.  Negative for rash.  Neurological: Negative.  Negative for dizziness, focal weakness and headaches.  Endo/Heme/Allergies: Negative.   All other systems reviewed and are negative.   Vitals:   11/16/17 0851  BP: 122/82  Pulse: 85  Resp: 16  Temp: 98 F (36.7 C)  SpO2: 98%    Physical Exam  Constitutional: He is oriented to person, place, and time. He appears well-developed and well-nourished.  HENT:  Head: Normocephalic and atraumatic.  Eyes: Pupils are equal, round, and reactive to light. EOM are normal.  Neck: Normal range of motion.  Cardiovascular: Normal rate and regular rhythm.  Pulmonary/Chest: Effort normal and breath sounds normal.  Musculoskeletal: Normal range of motion.  Hands: NVI.  No obvious deformities.  No swelling or erythema.  Full range of motion with good grip but complains of pain when making a fist.  Neurological: He is alert and oriented to person, place, and time.   Skin: Skin is warm. Capillary refill takes less than 2 seconds.  Psychiatric: He has a normal mood and affect. His behavior is normal.  Vitals reviewed.   A total of 25 minutes was spent in the room with the patient, greater than 50% of which was in counseling/coordination of care regarding differential diagnosis, treatment, medications, and need for follow-up.   ASSESSMENT & PLAN: [place] was seen today for hand pain and medication refill.  Diagnoses and all orders for this visit:  Arthralgia of both hands  Other orders -     levocetirizine (XYZAL) 5 MG tablet; Take 1 tablet (5 mg total) by mouth daily as needed (drippy nose).   Advised to take Tylenol or Aleve as needed for pain. Patient Instructions       If you have lab work done today you will be contacted with your lab results within the next 2 weeks.  If you have Elijah heard from [place] then please contact us. The fastest way to get your results is to register for My Chart.   IF you received an x-ray today, you will receive an invoice from [facility]. Please contact [facility] at [phone] with questions or concerns regarding your invoice.   IF you received labwork today, you will receive an invoice from [place]. Please contact [facility] at [phone] with questions or concerns regarding your invoice.   Our billing staff will Elijah be able to assist you with questions regarding bills from these companies.  You will be contacted with the lab results as soon as they are available. The fastest way to get your results is to activate your My Chart account. Instructions are located on the last page of this paperwork. If you have Elijah heard from [place] regarding the results in 2 weeks, please contact this office.     Joint Pain Joint pain, which is also called arthralgia, can be caused by many things. Joint pain often goes away when you follow your health care provider's instructions for relieving pain at home.  However, joint  pain can also be caused by conditions that require further treatment. Common causes of joint pain include:  Bruising in the area of the joint.  Overuse of the joint.  Wear and tear on the joints that occur with aging (osteoarthritis).  Various other forms of arthritis.  A buildup of a crystal form of uric acid in the joint (gout).  Infections of the joint (septic arthritis) or of the bone (osteomyelitis).  Your health care provider may recommend medicine to help with the pain. If your joint pain continues, additional tests may be needed to diagnose your condition. Follow these instructions at home: Watch your condition for any changes. Follow these instructions as directed to lessen the pain that you are feeling.  Take medicines only as directed by your health care provider.  Rest the affected area for as long as your health care provider says that you should. If directed to do so, raise the painful joint above the level of your heart while you are sitting or lying down.  Do not do things that cause or worsen pain.  If directed, apply ice to the painful area: ? Put ice in a plastic bag. ? Place a towel between your skin and the bag. ? Leave the ice on for 20 minutes, 2-3 times per day.  Wear an elastic bandage, splint, or sling as directed by your health care provider. Loosen the elastic bandage or splint if your fingers or toes become numb and tingle, or if they turn cold and blue.  Begin exercising or stretching the affected area as directed by your health care provider. Ask your health care provider what types of exercise are safe for you.  Keep all follow-up visits as directed by your health care provider. This is important.  Contact a health care provider if:  Your pain increases, and medicine does Elijah help.  Your joint pain does Elijah improve within 3 days.  You have increased bruising or swelling.  You have a fever.  You lose 10 lb (4.5 kg) or more  without trying. Get help right away if:  You are Elijah able to move the joint.  Your fingers or toes become numb or they turn cold and blue. This information is Elijah intended to replace advice given to you by your health care provider. Make sure you discuss any questions you have with your health care provider. Document Released: 01/23/2005 Document Revised: 06/25/2015 Document Reviewed: 11/04/2013 Elsevier Interactive Patient Education  2018 Elsevier Inc.      Agustina Caroli, [facility]

## 2017-11-22 ENCOUNTER — Ambulatory Visit: Payer: Medicare Other | Admitting: Family Medicine

## 2017-11-24 ENCOUNTER — Other Ambulatory Visit: Payer: Self-pay | Admitting: Family Medicine

## 2017-11-26 NOTE — Telephone Encounter (Signed)
[facility] called and spoke to [place], [facility]. I asked did they receive the prescription refill for Lansoprazole on 09/27/17 #180/1 refill. She verbalized it was received and the insurance will only pay for the prescription written 1 x day. She says the insurance company will need to be called to give the approval to take more than once a day.

## 2017-11-26 NOTE — Telephone Encounter (Signed)
Requested medication (s) are due for refill today: Yes  Requested medication (s) are on the active medication list: Yes  Last refill:  09/27/17  Future visit scheduled: No  Notes to clinic:  Unable to refill, please read note below for explanation, insurance company needs approval to refill 2 x day.     Requested Prescriptions  Pending Prescriptions Disp Refills   lansoprazole (PREVACID) 30 MG capsule [Pharmacy Med Name: LANSOPRAZOLE DR 30 MG CAPSULE] 63 capsule 2    Sig: TAKE 1 CAPSULE BY MOUTH 2 TIMES DAILY BEFORE A MEAL (*MAX 117/90DAYS PER INS*)     Gastroenterology: Proton Pump Inhibitors Passed - 11/26/2017  9:58 AM      Passed - Valid encounter within last 12 months    Recent Outpatient Visits          1 week ago Arthralgia of both hands   [facility] at [facility], Ines Bloomer, MD   4 months ago Abnormal colonoscopy   [facility], Laurey Arrow, MD   7 months ago Essential hypertension   [facility], Laurey Arrow, MD   7 months ago Essential hypertension   [facility], Laurey Arrow, MD   9 months ago Chronic pain of both shoulders   [facility] at [facility], Laurey Arrow, MD      Future Appointments            In 1 month Melvyn Novas, Christena Deem, MD [facility]

## 2017-12-13 ENCOUNTER — Other Ambulatory Visit: Payer: Self-pay | Admitting: Family Medicine

## 2017-12-18 ENCOUNTER — Other Ambulatory Visit: Payer: Self-pay | Admitting: Family Medicine

## 2017-12-18 NOTE — Telephone Encounter (Signed)
Requested medication (s) are due for refill today: yes  Requested medication (s) are on the active medication list: yes  Last refill:  09/20/17  Future visit scheduled: no  Notes to clinic:  Controlled substance   Requested Prescriptions  Pending Prescriptions Disp Refills   traMADol (ULTRAM) 50 MG tablet [Pharmacy Med Name: TRAMADOL HCL 50 MG TABLET] 90 tablet 2    Sig: TAKE 1 TO 2 TABLETS BY MOUTH EVERY 8 HOURS AS NEEDED FOR MODERATE PAIN     Not Delegated - Analgesics:  Opioid Agonists Failed - 12/18/2017  9:47 AM      Failed - This refill cannot be delegated      Failed - Urine Drug Screen completed in last 360 days.      Passed - Valid encounter within last 6 months    Recent Outpatient Visits          1 month ago Arthralgia of both hands   Primary Care at [facility], Ines Bloomer, MD   5 months ago Abnormal colonoscopy   [facility], Laurey Arrow, MD   8 months ago Essential hypertension   [facility] at [facility], Laurey Arrow, MD   8 months ago Essential hypertension   [facility] at [facility], Laurey Arrow, MD   10 months ago Chronic pain of both shoulders   [facility] at [facility], Laurey Arrow, MD      Future Appointments            In 1 week Melvyn Novas Christena Deem, MD [facility]

## 2017-12-20 ENCOUNTER — Other Ambulatory Visit: Payer: Self-pay

## 2017-12-20 ENCOUNTER — Telehealth: Payer: Self-pay

## 2017-12-20 MED ORDER — TRAMADOL HCL 50 MG PO TABS: 50.0000 mg | ORAL_TABLET | Freq: Three times a day (TID) | ORAL | 0 refills | Status: DC | PRN

## 2017-12-20 NOTE — Telephone Encounter (Signed)
Copied from [place] [id number]. Topic: General - Other >> Dec 20, 2017  2:57 PM Yvette Rack wrote: Reason for CRM: Pt called in for an update on his refill request for traMADol (ULTRAM) 50 MG tablet. Pt states he is hurting and needs the Rx sent to the pharmacy. Spoke with Triage nurse and was advised to send to office.   Please see CRM for request for refill for tramadol.  Thank you.

## 2017-12-20 NOTE — Telephone Encounter (Signed)
Spoke with pt advised no further refills without an appt.  Pt scheduled with Brigitte Pulse on 01/07/18 at 9:30 am.  Advised pt I would check with shaw to see if she is willing to give him enough until appt and will call him back with her response.  Luisa Dago of situation and asked her to consult with Brigitte Pulse concerning this matter and let me know her response so I can call pt back.  She is agreeable and pt is agreeable with course of action. Dgaddy, CMA

## 2017-12-20 NOTE — Telephone Encounter (Signed)
                          Spoke with pt advised dr Brigitte Pulse approved 30 day supply of tramadol 50 mg #90  with no refills to get him to his appt on 01/07/18 and any further refills will given at appt.  Pt agreeable. Dgaddy

## 2017-12-21 ENCOUNTER — Inpatient Hospital Stay: Payer: Medicare Other | Attending: Internal Medicine

## 2017-12-21 ENCOUNTER — Ambulatory Visit (HOSPITAL_COMMUNITY)
Admission: RE | Admit: 2017-12-21 | Discharge: 2017-12-21 | Disposition: A | Discharge status: Home / Self Care | Payer: Medicare Other | Source: Ambulatory Visit | Attending: Internal Medicine | Admitting: Internal Medicine

## 2017-12-21 ENCOUNTER — Encounter (HOSPITAL_COMMUNITY): Payer: Self-pay

## 2017-12-21 DIAGNOSIS — Z79899 Other long term (current) drug therapy: Secondary | ICD-10-CM | POA: Diagnosis not present

## 2017-12-21 DIAGNOSIS — C3490 Malignant neoplasm of unspecified part of unspecified bronchus or lung: Secondary | ICD-10-CM

## 2017-12-21 DIAGNOSIS — J439 Emphysema, unspecified: Secondary | ICD-10-CM | POA: Diagnosis not present

## 2017-12-21 DIAGNOSIS — J449 Chronic obstructive pulmonary disease, unspecified: Secondary | ICD-10-CM | POA: Diagnosis not present

## 2017-12-21 DIAGNOSIS — I700 Atherosclerosis of aorta: Secondary | ICD-10-CM | POA: Diagnosis not present

## 2017-12-21 DIAGNOSIS — C3431 Malignant neoplasm of lower lobe, right bronchus or lung: Secondary | ICD-10-CM | POA: Diagnosis not present

## 2017-12-21 DIAGNOSIS — I10 Essential (primary) hypertension: Secondary | ICD-10-CM | POA: Insufficient documentation

## 2017-12-21 LAB — CBC WITH DIFFERENTIAL/PLATELET
Abs Immature Granulocytes: 0.01 10*3/uL (ref 0.00–0.07)
Basophils Absolute: 0.1 10*3/uL (ref 0.0–0.1)
Basophils Relative: 1 %
Eosinophils Absolute: 0.2 10*3/uL (ref 0.0–0.5)
Eosinophils Relative: 3 %
HCT: 48.5 % (ref 39.0–52.0)
Hemoglobin: 16.3 g/dL (ref 13.0–17.0)
Immature Granulocytes: 0 %
Lymphocytes Relative: 40 %
Lymphs Abs: 2.7 10*3/uL (ref 0.7–4.0)
MCH: 29 pg (ref 26.0–34.0)
MCHC: 33.6 g/dL (ref 30.0–36.0)
MCV: 86.3 fL (ref 80.0–100.0)
Monocytes Absolute: 0.8 10*3/uL (ref 0.1–1.0)
Monocytes Relative: 12 %
Neutro Abs: 2.9 10*3/uL (ref 1.7–7.7)
Neutrophils Relative %: 44 %
Platelets: 232 10*3/uL (ref 150–400)
RBC: 5.62 MIL/uL (ref 4.22–5.81)
RDW: 13.3 % (ref 11.5–15.5)
WBC: 6.6 10*3/uL (ref 4.0–10.5)
nRBC: 0 % (ref 0.0–0.2)

## 2017-12-21 LAB — COMPREHENSIVE METABOLIC PANEL
ALT: 14 U/L (ref 0–44)
AST: 22 U/L (ref 15–41)
Albumin: 4.1 g/dL (ref 3.5–5.0)
Alkaline Phosphatase: 98 U/L (ref 38–126)
Anion gap: 10 (ref 5–15)
BUN: 8 mg/dL (ref 8–23)
CO2: 28 mmol/L (ref 22–32)
Calcium: 9.4 mg/dL (ref 8.9–10.3)
Chloride: 102 mmol/L (ref 98–111)
Creatinine, Ser: 1.19 mg/dL (ref 0.61–1.24)
GFR calc Af Amer: >60 mL/min (ref >60)
GFR calc non Af Amer: >60 mL/min (ref >60)
Glucose, Bld: 97 mg/dL (ref 70–99)
Potassium: 3.7 mmol/L (ref 3.5–5.1)
Sodium: 140 mmol/L (ref 135–145)
Total Bilirubin: 0.7 mg/dL (ref 0.3–1.2)
Total Protein: 7.2 g/dL (ref 6.5–8.1)

## 2017-12-21 MED ORDER — IOHEXOL 300 MG/ML  SOLN
75.0000 mL | Freq: Once | INTRAMUSCULAR | Status: AC | PRN
  Administered 2017-12-21: 75 mL via INTRAVENOUS

## 2017-12-21 MED ORDER — SODIUM CHLORIDE (PF) 0.9 % IJ SOLN
INTRAMUSCULAR | Status: AC
  Filled 2017-12-21: qty 50

## 2017-12-23 NOTE — Telephone Encounter (Signed)
rx sent in per chart - Tramadol With note - no further until OV

## 2017-12-24 ENCOUNTER — Telehealth: Payer: Self-pay | Admitting: Internal Medicine

## 2017-12-24 ENCOUNTER — Other Ambulatory Visit: Payer: Self-pay

## 2017-12-24 ENCOUNTER — Encounter: Payer: Self-pay | Admitting: Internal Medicine

## 2017-12-24 ENCOUNTER — Inpatient Hospital Stay (HOSPITAL_BASED_OUTPATIENT_CLINIC_OR_DEPARTMENT_OTHER): Payer: Medicare Other | Admitting: Internal Medicine

## 2017-12-24 VITALS — BP 136/89 | HR 93 | Temp 98.0°F | Resp 17 | Ht 68.11 in | Wt 176.8 lb

## 2017-12-24 DIAGNOSIS — J449 Chronic obstructive pulmonary disease, unspecified: Secondary | ICD-10-CM | POA: Diagnosis not present

## 2017-12-24 DIAGNOSIS — C3431 Malignant neoplasm of lower lobe, right bronchus or lung: Secondary | ICD-10-CM | POA: Diagnosis not present

## 2017-12-24 DIAGNOSIS — I10 Essential (primary) hypertension: Secondary | ICD-10-CM | POA: Diagnosis not present

## 2017-12-24 DIAGNOSIS — C3491 Malignant neoplasm of unspecified part of right bronchus or lung: Secondary | ICD-10-CM | POA: Diagnosis not present

## 2017-12-24 DIAGNOSIS — Z79899 Other long term (current) drug therapy: Secondary | ICD-10-CM | POA: Diagnosis not present

## 2017-12-24 DIAGNOSIS — C3490 Malignant neoplasm of unspecified part of unspecified bronchus or lung: Secondary | ICD-10-CM

## 2017-12-24 NOTE — Telephone Encounter (Signed)
Printed calendar and avs. °

## 2017-12-24 NOTE — Progress Notes (Signed)
[place] [phone]) [phone]   [phone]) [phone]  OFFICE PROGRESS NOTE  [name], MD [address] [place] [address]  DIAGNOSIS: Stage IA (T1a, N0, M0) non-small cell lung cancer, moderately differentiated squamous cell carcinoma presented with right lower lobe lung nodule.   PRIOR THERAPY: Status post wedge resection of the right lower lobe in April 2014 under the care of Dr. Roxan Hockey.  CURRENT THERAPY: Observation.  INTERVAL HISTORY: Elijah Marshall 67 y.o. male returns to the clinic today for annual follow-up visit.  The patient is feeling fine today with no concerning complaints except for occasional shortness of breath with exertion as well as wheezing.  He has COPD and followed by Dr. Melvyn Novas.  He denied having any current chest pain, cough or hemoptysis.  He denied having any fever or chills.  He has no nausea, vomiting, diarrhea or constipation.  He denied having any recent weight loss or night sweats.  The patient is here today for evaluation with repeat CT scan of the chest for restaging of his disease.    MEDICAL HISTORY: Past Medical History:  Diagnosis Date  . Allergy   . Arthritis   . BPH with obstruction/lower urinary tract symptoms 2016   Nocturia  . Cancer [facility])    unsure at this time  . Cancer of lower lobe of right lung ([place])   . COPD (chronic obstructive pulmonary disease) ([place])    per patient's health survey - he put a ?  . Depression   . Erectile dysfunction due to arterial insufficiency   . GERD (gastroesophageal reflux disease)   . Hyperlipidemia   . Hypertension   . Non-small cell carcinoma of lung, right ([place]) 06/24/2015  . Shortness of breath   . Ulcer     ALLERGIES:  is allergic to aspirin and morphine and related.  MEDICATIONS:  Current Outpatient Medications  Medication Sig Dispense Refill  . albuterol (VENTOLIN HFA) 108 (90 Base) MCG/ACT inhaler INHALE USING 2 PUFFS EVERY 4 HOURS AS NEEDED ONLY IF YOUR  CAN'T CATCH YOUR BREATH 18 Inhaler 0  . bisoprolol-hydrochlorothiazide (ZIAC) 2.5-6.25 MG tablet TAKE 1 TABLET BY MOUTH EVERY DAY 90 tablet 1  . clonazePAM (KLONOPIN) 1 MG tablet Take 1 mg by mouth 2 (two) times daily as needed for anxiety.    . docusate sodium (STOOL SOFTENER) 100 MG capsule Per bottle as needed    . Guaifenesin (MUCINEX MAXIMUM STRENGTH) 1200 MG TB12 Take 1 tablet (1,200 mg total) by mouth every 12 (twelve) hours as needed. 14 tablet 1  . lansoprazole (PREVACID) 30 MG capsule Take 1 capsule (30 mg total) by mouth 2 (two) times daily. 180 capsule 1  . OXYGEN 2lpm with exertion only    . PEG-KCl-NaCl-NaSulf-Na Asc-C (PLENVU) 140 g SOLR Take 140 g by mouth as directed. 1 each 0  . pravastatin (PRAVACHOL) 40 MG tablet Take 1 tablet (40 mg total) by mouth at bedtime. 90 tablet 3  . ranitidine (ZANTAC) 150 MG capsule Take 150 mg by mouth every evening.     . traMADol (ULTRAM) 50 MG tablet Take 1-2 tablets (50-100 mg total) by mouth every 8 (eight) hours as needed for moderate pain. **NEED F/U OFFICE VISIT FURTHER REFILLS** 90 tablet 0  . umeclidinium bromide (INCRUSE ELLIPTA) 62.5 MCG/INH AEPB Inhale 1 puff into the lungs daily. 1 each 11  . levocetirizine (XYZAL) 5 MG tablet Take 1 tablet (5 mg total) by mouth daily as needed (drippy nose).  30 tablet 3   No current facility-administered medications for this visit.     SURGICAL HISTORY:  Past Surgical History:  Procedure Laterality Date  . ANTERIOR FUSION LUMBAR SPINE  07/25/2012  . ANTERIOR LAT LUMBAR FUSION Left 07/25/2012   Procedure: ANTERIOR LATERAL LUMBAR FUSION 1 LEVEL;  Surgeon: [name], MD;  Location: [place];  Service: Orthopedics;  Laterality: Left;  Left sided lumbar 3-4 lateral interbody fusion with allograft and instrumentation  . COLONOSCOPY  14   polyps rem  . JOINT REPLACEMENT Right 2010  . NODE DISSECTION Right 05/24/2015   Procedure: NODE DISSECTION;  Surgeon: [name], MD;  Location: [place];  Service: Thoracic;  Laterality: Right;  . SEGMENTECOMY Right 05/24/2015   Procedure: RIGHT LOWER LOBE SUPERIOR SEGMENTECTOMY;  Surgeon: [name], MD;  Location: [place];  Service: Thoracic;  Laterality: Right;  . [place]  2012  . VIDEO ASSISTED THORACOSCOPY (VATS)/WEDGE RESECTION Right 05/24/2015   Procedure: RIGHT VIDEO ASSISTED THORACOSCOPY (VATS)/ RIGHTR LOWER LOBE WEDGE RESECTION;  Surgeon: [name], MD;  Location: [place];  Service: Thoracic;  Laterality: Right;    REVIEW OF SYSTEMS:  A comprehensive review of systems was negative except for: Respiratory: positive for dyspnea on exertion   PHYSICAL EXAMINATION: General appearance: alert, cooperative and no distress Head: Normocephalic, without obvious abnormality, atraumatic Neck: no adenopathy, no JVD, supple, symmetrical, trachea midline and thyroid not enlarged, symmetric, no tenderness/mass/nodules Lymph nodes: Cervical, supraclavicular, and axillary nodes normal. Resp: clear to auscultation bilaterally Back: symmetric, no curvature. ROM normal. No CVA tenderness. Cardio: regular rate and rhythm, S1, S2 normal, no murmur, click, rub or gallop GI: soft, non-tender; bowel sounds normal; no masses,  no organomegaly Extremities: extremities normal, atraumatic, no cyanosis or edema  ECOG PERFORMANCE STATUS: 1 - Symptomatic but completely ambulatory  Blood pressure 136/89, pulse 93, temperature 98 F (36.7 C), temperature source Oral, resp. rate 17, height 5' 8.11" (1.73 m), weight 176 lb 12.8 oz (80.2 kg), SpO2 95 %.  LABORATORY DATA: Lab Results  Component Value Date   WBC 6.6 12/21/2017   HGB 16.3 12/21/2017   HCT 48.5 12/21/2017   MCV 86.3 12/21/2017   PLT 232 12/21/2017      Chemistry      Component Value Date/Time   NA 140 12/21/2017 0807   NA 141 12/25/2016 1010   K 3.7 12/21/2017 0807   K 3.6 12/25/2016 1010   CL 102 12/21/2017 0807   CO2 28 12/21/2017 0807   CO2 28 12/25/2016 1010     BUN 8 12/21/2017 0807   BUN 6.1 (L) 12/25/2016 1010   CREATININE 1.19 12/21/2017 0807   CREATININE 1.1 12/25/2016 1010      Component Value Date/Time   CALCIUM 9.4 12/21/2017 0807   CALCIUM 9.6 12/25/2016 1010   ALKPHOS 98 12/21/2017 0807   ALKPHOS 107 12/25/2016 1010   AST 22 12/21/2017 0807   AST 19 12/25/2016 1010   ALT 14 12/21/2017 0807   ALT 15 12/25/2016 1010   BILITOT 0.7 12/21/2017 0807   BILITOT 0.56 12/25/2016 1010       RADIOGRAPHIC STUDIES: Ct Chest W Contrast  Result Date: 12/21/2017 CLINICAL DATA:  Lung cancer diagnosed in 2017 with wedge resection 05/24/2015. EXAM: CT CHEST WITH CONTRAST TECHNIQUE: Multidetector CT imaging of the chest was performed during intravenous contrast administration. CONTRAST:  63mL OMNIPAQUE IOHEXOL 300 MG/ML  SOLN COMPARISON:  Radiographs 09/07/2017.  CT 12/25/2016. FINDINGS: Cardiovascular: Mild aortic and coronary  artery atherosclerosis. No acute vascular findings are demonstrated. The heart size is normal. There is no pericardial effusion. Mediastinum/Nodes: There are no enlarged mediastinal, hilar or axillary lymph nodes. The thyroid gland and esophagus appear normal. There are new nodules dependently within the right mainstem bronchus, measuring 10 mm on images 66/7 and 68/7. There is also new mild nodularity along the left aspect of the trachea (not the same area noted previously). Lungs/Pleura: Stable mild right lower lobe scarring status post wedge resection. There is no pleural effusion or pneumothorax. There is stable mild thickening along the posterior aspect of the minor fissure. There is moderate to severe centrilobular and paraseptal emphysema. No suspicious pulmonary nodules. Upper abdomen:  The visualized upper abdomen appears unremarkable. Musculoskeletal/Chest wall: There is no chest wall mass or suspicious osseous finding. IMPRESSION: 1. Stable postsurgical changes in the right lower lobe. No evidence of local recurrence or  metastatic disease. 2. Moderate to severe Emphysema (ICD10-J43.9). 3. New dependent nodularity in the right mainstem bronchus, likely inspissated/adherent secretions. Lesser fluctuating similar findings in the trachea. 4. Coronary and Aortic Atherosclerosis (ICD10-I70.0). Electronically Signed   By: Richardean Sale M.D.   On: 12/21/2017 14:21    ASSESSMENT AND PLAN:  This is a very pleasant 67 years old African-American male with a stage IA non-small cell lung cancer status post wedge resection of the right lower lobe in 2014. The patient is doing well with no concerning complaints. He had repeat CT scan of the chest performed recently.  I personally and independently reviewed the scans and discussed the results with the patient today.  His scan showed no concerning findings for disease recurrence or progression. I recommended for him to continue on observation with repeat CT scan of the chest in 1 year. For COPD, he will continue his routine follow-up visit and evaluation by Dr. Melvyn Novas. The patient voices understanding of current disease status and treatment options and is in agreement with the current care plan. All questions were answered. The patient knows to call the clinic with any problems, questions or concerns. We can certainly see the patient much sooner if necessary. I spent 10 minutes counseling the patient face to face. The total time spent in the appointment was 15 minutes.  Disclaimer: This note was dictated with voice recognition software. Similar sounding words can inadvertently be transcribed and may not be corrected upon review.

## 2017-12-26 ENCOUNTER — Encounter: Payer: Medicare Other | Admitting: Gastroenterology

## 2017-12-28 ENCOUNTER — Ambulatory Visit (INDEPENDENT_AMBULATORY_CARE_PROVIDER_SITE_OTHER): Payer: Medicare Other | Admitting: Internal Medicine

## 2017-12-28 ENCOUNTER — Encounter: Payer: Self-pay | Admitting: Internal Medicine

## 2017-12-28 VITALS — BP 112/80 | HR 80 | Ht 69.0 in | Wt 176.6 lb

## 2017-12-28 DIAGNOSIS — J449 Chronic obstructive pulmonary disease, unspecified: Secondary | ICD-10-CM

## 2017-12-28 DIAGNOSIS — J9611 Chronic respiratory failure with hypoxia: Secondary | ICD-10-CM

## 2017-12-28 DIAGNOSIS — C3491 Malignant neoplasm of unspecified part of right bronchus or lung: Secondary | ICD-10-CM

## 2017-12-28 NOTE — Patient Instructions (Addendum)
No change medications   Please schedule a follow up visit in 3 months but call sooner if needed  - bring your medication calendar and drug formulary so we can see if there is a cheaper alternative under your present drug plan  -add: advise that p review of ct chest he needs to call for any recurrent hemoptysis to arrange f/u

## 2017-12-28 NOTE — Progress Notes (Signed)
Subjective:    Patient ID: Elijah Marshall, male    DOB: 02-18-1950  MRN: [id number]   Brief patient profile:  67  yobm quit smoking 2013  and able work out at [employer] and did fine until hurt back 2011 then 2 back surgeries and knee surgery  and more noticeable doe  since then but also limited by back and s/p sup segmentectomy RLL NSC lung ca Ia 05/2015  referred to pulmonary clinic 09/05/2013 for ? Copd  With GOLD II criteria 10/29/13 and confirmed 01/10/2016     History of Present Illness  09/05/2013 1st [facility] office visit/ Lisbeth Puller  Chief Complaint  Patient presents with  . Pulmonary Consult    Referred per Dr. Norberto Sorenson.  Pt c/o SOB for the past 3 yrs. He states that he was dxed with COPD back in 2012 or 2013.  He states that that he sometimes has trouble with breathing when walking up stairs and lifting things.    rx spiriva first, alb, dulera not as effective  No problem at rest or supine Assoc nasal congestion much better p shot (?depomedrol) and bad again x sev years corresponds to worse doe  Ex = walking one mile slower pace than wife,trouble with hills, worse in heat  Bad hb even on dexilant  Min dry cough  rec Stop spiriva  Start anoro two puffs off one click each am Zantac 150 mg one at  Bedtime Prednisone 10 mg take  4 each am x 2 days,   2 each am x 2 days,  1 each am x 2 days and stop  GERD diet    04/19/2017  f/u ov/Dailey Alberson re:  GOLD II copd /  Chief Complaint  Patient presents with  . Follow-up    Patient was told to follow up with formulary in hand to discuss medications.   Dyspnea:   improved with walking back up  to 30 min on 2lpm  Cough: none Sleep: no resp problems SABA use:  No change on bevespi vs symbicort / concerned with cost issues rec Plan A = Automatic = Bevespi 2 every 12 hours or ANORO one click each am  But pick the one that works the best for you and if they are both the same then chose the less costly  Plan B = Backup Only use your albuterol  as a rescue medication   Could not tol anoro due to tremors > resolved on Incruse        12/28/2017  f/u ov/Dajohn Ellender re:  GOLD III/ incruse maint and 02 on 2lpm ex / no med calendar  Chief Complaint  Patient presents with  . Follow-up    No more hemoptysis since last visit. He is using his albuterol inhaler 3 x per wk on average.   Dyspnea:  Baseline = MMRC2 = can't walk a nl pace on a flat grade s sob but does fine slow and flat on 2lpm  Cough: improved/ min mucoid no disturbing sleep  Sleeping: flat SABA use: minimal use but could not tell med when to start or how often he can use saba if worse (instructions are on action plan of med calendar he forgot to bring)   No obvious day to day or daytime variability or assoc excess/ purulent sputum or mucus plugs or hemoptysis or cp or chest tightness, subjective wheeze or overt sinus or hb symptoms.   Sleeping ok as above  without nocturnal  or early am exacerbation  of respiratory  c/o's or need for noct saba. Also denies any obvious fluctuation of symptoms with weather or environmental changes or other aggravating or alleviating factors except as outlined above   No unusual exposure hx or h/o childhood pna/ asthma or knowledge of premature birth.  Current Allergies, Complete Past Medical History, Past Surgical History, Family History, and Social History were reviewed in [employer] record.  ROS  The following are not active complaints unless bolded Hoarseness, sore throat, dysphagia, dental problems, itching, sneezing,  nasal congestion or discharge of excess mucus or purulent secretions, ear ache,   fever, chills, sweats, unintended wt loss or wt gain, classically pleuritic or exertional cp,  orthopnea pnd or arm/hand swelling  or leg swelling, presyncope, palpitations, abdominal pain, anorexia, nausea, vomiting, diarrhea  or change in bowel habits or change in bladder habits, change in stools or change in urine, dysuria,  hematuria,  rash, arthralgias, visual complaints, headache, numbness, weakness or ataxia or problems with walking or coordination,  change in mood or  memory.        Current Meds  Medication Sig  . albuterol (VENTOLIN HFA) 108 (90 Base) MCG/ACT inhaler INHALE USING 2 PUFFS EVERY 4 HOURS AS NEEDED ONLY IF YOUR CAN'T CATCH YOUR BREATH  . bisoprolol-hydrochlorothiazide (ZIAC) 2.5-6.25 MG tablet TAKE 1 TABLET BY MOUTH EVERY DAY  . clonazePAM (KLONOPIN) 1 MG tablet Take 1 mg by mouth 2 (two) times daily as needed for anxiety.  . docusate sodium (STOOL SOFTENER) 100 MG capsule Per bottle as needed  . Guaifenesin (MUCINEX MAXIMUM STRENGTH) 1200 MG TB12 Take 1 tablet (1,200 mg total) by mouth every 12 (twelve) hours as needed.  . lansoprazole (PREVACID) 30 MG capsule Take 1 capsule (30 mg total) by mouth 2 (two) times daily.  . OXYGEN 2lpm with exertion only  . PEG-KCl-NaCl-NaSulf-Na Asc-C (PLENVU) 140 g SOLR Take 140 g by mouth as directed.  . pravastatin (PRAVACHOL) 40 MG tablet Take 1 tablet (40 mg total) by mouth at bedtime.  . ranitidine (ZANTAC) 150 MG capsule Take 150 mg by mouth every evening.   . traMADol (ULTRAM) 50 MG tablet Take 1-2 tablets (50-100 mg total) by mouth every 8 (eight) hours as needed for moderate pain. **NEED F/U OFFICE VISIT FURTHER REFILLS**  . umeclidinium bromide (INCRUSE ELLIPTA) 62.5 MCG/INH AEPB Inhale 1 puff into the lungs daily.        Objective:   Physical Exam  amb hoarse bm nad    12/28/2017      176  08/27/2017        176  06/22/2017        177  05/22/2017        183  04/19/2017        179      07/31/2016        182  05/16/2016        181  04/28/2016        185  01/10/2016        179    11/19/15 182 lb (82.6 kg)  11/11/15 180 lb (81.6 kg)  11/04/15 181 lb 3.5 oz (82.2 kg)      Vital signs reviewed - Note on arrival 02 sats  91% on RA   HEENT: nl dentition / oropharynx. Nl external ear canals without cough reflex -  Mild bilateral non-specific  turbinate edema     NECK :  without JVD/Nodes/TM/ nl carotid upstrokes bilaterally   LUNGS: no acc muscle use,  Mod barrel  contour chest wall with bilateral  Distant bs s audible wheeze and  without cough on insp or exp maneuver and mod  Hyperresonant  to  percussion bilaterally     CV:  RRR  no s3 or murmur or increase in P2, and no edema   ABD:  soft and nontender with pos mid insp Hoover's  in the supine position. No bruits or organomegaly appreciated, bowel sounds nl  MS:   Nl gait/  ext warm without deformities, calf tenderness, cyanosis or clubbing No obvious joint restrictions   SKIN: warm and dry without lesions    NEURO:  alert, approp, nl sensorium with  no motor or cerebellar deficits apparent.               I personally reviewed images and agree with radiology impression as follows:   Chest CT 12/21/17  1. Stable postsurgical changes in the right lower lobe. No evidence of local recurrence or metastatic disease. 2. Moderate to severe Emphysema (ICD10-J43.9). 3. New dependent nodularity in the right mainstem bronchus, likely inspissated/adherent secretions. Lesser fluctuating similar findings in the trachea.

## 2017-12-30 ENCOUNTER — Encounter: Payer: Self-pay | Admitting: Internal Medicine

## 2017-12-30 NOTE — Assessment & Plan Note (Signed)
SATURATION QUALIFICATIONS:  11/19/2015  Patient Saturations on Room Air at Rest = 96% Patient Saturations on Room Air while Ambulating = 86% Patient Saturations on 2  Liters of oxygen while Ambulating = 96% - 05/16/2016 :  Patient Saturations on Room Air at Rest = 88%--- increased to 99% on 2lpm continuous with activity     Adequate control on present rx, reviewed in detail with pt > no change in rx needed  > 02 2lpm with activity more than room to room.

## 2017-12-30 NOTE — Assessment & Plan Note (Signed)
-   Spirometry  03/01/12  FEV1  1.63 - Spirometry   09/05/13 FEV1  1.34 (46%) ratio 44  - PFTs   10/29/2013    FEV1    1.70 (62%) ratio 50% no better after ssaba and DLCO 48%  - 09/05/2013  Walked RA x 3 laps @ fast pace @  185 ft each stopped due to end of study, fast pace,sat 89% at end  - 01/06/14 gradual worsening on anoro plus increase need for saba  - 01/28/2014   > try spiriva respimat/ symbicort 160 2bid > changed to symbicort 160 2bid only on 01/28/14  - 02/25/2014   90%   > changed to symbicort 80 2bid since hoarse on 160 and clear on exam  - 11/19/2015 rechallenge with symbicort 160 Take 2 puffs first thing in am and then another 2 puffs about 12 hours later.  - PFT's  01/10/2016  FEV1 1.38 (51 % ) ratio 47  p 1 % improvement from saba p symb 160  prior to study with DLCO  43/41  % corrects to 47  % for alv volume   - 02/21/2017  After extensive coaching inhaler device  effectiveness =    90% vs baseline < 25%  - 02/21/2017 changed to bevespi 2 bid  - 04/19/2017  Demonstrated elipta > try anoro to see if cost less/ works as well  - PFT's  05/22/2017  FEV1 1.21  (46 % ) ratio 46  p 1 % improvement from saba p anoro and albuterol 1 h prior to study with DLCO  38 % corrects to 49 % for alv volume   - changed to Incruse 06/04/17 due to tremors > resolved  - 08/27/2017  After extensive coaching inhaler device  effectiveness =    90%    Adequate control on present rx, reviewed in detail with pt > no change in rx needed    Reviewed approp use of saba as per med calendar action plan he's already been given.  Also: Formulary restrictions will be an ongoing challenge for the forseable future and I would be happy to pick an alternative if the pt will first  provide me a list of them but pt  will need to return here for training for any new device that is required eg dpi vs hfa vs respimat.    In meantime we can always provide samples so the patient never runs out of any needed respiratory medications.

## 2017-12-30 NOTE — Assessment & Plan Note (Signed)
DIAGNOSIS: Stage IA (T1a, N0, M0) non-small cell lung cancer, moderately differentiated squamous cell carcinoma presented with right lower lobe lung nodule.   PRIOR THERAPY: Status post wedge resection of the right lower lobe in April 2014 under the care of Dr. Roxan Hockey.  CURRENT THERAPY: Observation.    Reviewed Dr Lew Dawes notes / entry above and current ct chest - he does have what appears to be multiple nodules in the airways that are c/w secretions/ not recurrent ca, and never had airway involvement before but very low threshold to do fob if any recurrent hemoptysis/ advised    I had an extended discussion with the patient reviewing all relevant studies completed to date and  lasting 15 to 20 minutes of a 25 minute visit         I had an extended discussion with the patient reviewing all relevant studies completed to date and  lasting 15 to 20 minutes of a 25 minute visit    Each maintenance medication was reviewed in detail including most importantly the difference between maintenance and prns and under what circumstances the prns are to be triggered using an action plan format that is not reflected in the computer generated alphabetically organized AVS but trather by a customized med calendar that reflects the AVS meds with confirmed 100% correlation and advised him to keep up better with it in future, showing it to all HCP's so it can be reviewed/ updated if needed.  See device teaching which extended face to face time for this visit re approp use of saba    In addition, Please see AVS for unique instructions that I personally wrote and verbalized to the the pt in detail and then reviewed with pt  by my nurse highlighting any  changes in therapy recommended at today's visit to their plan of care.

## 2017-12-31 ENCOUNTER — Telehealth: Payer: Self-pay | Admitting: *Deleted

## 2017-12-31 NOTE — Telephone Encounter (Signed)
LMTCB

## 2017-12-31 NOTE — Telephone Encounter (Signed)
-----   Message from [name], MD sent at 12/30/2017  6:18 AM EST ----- Let him know I reviewed his last ct /oncology notes and that if he has any further hemoptysis he needs to call so we can look at his airways to see what the source is

## 2018-01-04 NOTE — Telephone Encounter (Signed)
ATC- someone answered and then hung up  WCB again 01/07/18

## 2018-01-07 ENCOUNTER — Encounter: Payer: Self-pay | Admitting: Family Medicine

## 2018-01-07 ENCOUNTER — Ambulatory Visit (INDEPENDENT_AMBULATORY_CARE_PROVIDER_SITE_OTHER): Payer: Medicare Other | Admitting: Family Medicine

## 2018-01-07 ENCOUNTER — Ambulatory Visit: Payer: Medicare Other | Admitting: Family Medicine

## 2018-01-07 VITALS — BP 126/72 | HR 68 | Temp 98.0°F | Resp 16 | Ht 67.72 in | Wt 178.0 lb

## 2018-01-07 DIAGNOSIS — G894 Chronic pain syndrome: Secondary | ICD-10-CM | POA: Insufficient documentation

## 2018-01-07 DIAGNOSIS — M25561 Pain in right knee: Secondary | ICD-10-CM

## 2018-01-07 DIAGNOSIS — M25562 Pain in left knee: Secondary | ICD-10-CM

## 2018-01-07 DIAGNOSIS — E7800 Pure hypercholesterolemia, unspecified: Secondary | ICD-10-CM

## 2018-01-07 DIAGNOSIS — M1990 Unspecified osteoarthritis, unspecified site: Secondary | ICD-10-CM | POA: Diagnosis not present

## 2018-01-07 DIAGNOSIS — I10 Essential (primary) hypertension: Secondary | ICD-10-CM | POA: Diagnosis not present

## 2018-01-07 DIAGNOSIS — M25552 Pain in left hip: Secondary | ICD-10-CM

## 2018-01-07 DIAGNOSIS — J310 Chronic rhinitis: Secondary | ICD-10-CM | POA: Diagnosis not present

## 2018-01-07 DIAGNOSIS — G8929 Other chronic pain: Secondary | ICD-10-CM

## 2018-01-07 DIAGNOSIS — Z9889 Other specified postprocedural states: Secondary | ICD-10-CM

## 2018-01-07 DIAGNOSIS — K219 Gastro-esophageal reflux disease without esophagitis: Secondary | ICD-10-CM | POA: Diagnosis not present

## 2018-01-07 DIAGNOSIS — K625 Hemorrhage of anus and rectum: Secondary | ICD-10-CM | POA: Diagnosis not present

## 2018-01-07 DIAGNOSIS — M25551 Pain in right hip: Secondary | ICD-10-CM | POA: Diagnosis not present

## 2018-01-07 DIAGNOSIS — F5101 Primary insomnia: Secondary | ICD-10-CM | POA: Diagnosis not present

## 2018-01-07 DIAGNOSIS — I2510 Atherosclerotic heart disease of native coronary artery without angina pectoris: Secondary | ICD-10-CM | POA: Diagnosis not present

## 2018-01-07 MED ORDER — AMITRIPTYLINE HCL 25 MG PO TABS: 25.0000 mg | ORAL_TABLET | Freq: Every evening | ORAL | 0 refills | Status: DC | PRN

## 2018-01-07 MED ORDER — HYDROCODONE-ACETAMINOPHEN 5-325 MG PO TABS: 1.0000 | ORAL_TABLET | Freq: Three times a day (TID) | ORAL | 0 refills | Status: DC | PRN

## 2018-01-07 MED ORDER — FAMOTIDINE 40 MG PO TABS: 40.0000 mg | ORAL_TABLET | Freq: Two times a day (BID) | ORAL | 3 refills | Status: AC

## 2018-01-07 MED ORDER — LANSOPRAZOLE 30 MG PO CPDR: 30.0000 mg | DELAYED_RELEASE_CAPSULE | Freq: Two times a day (BID) | ORAL | 1 refills | Status: DC

## 2018-01-07 MED ORDER — BISOPROLOL-HYDROCHLOROTHIAZIDE 5-6.25 MG PO TABS: 1.0000 | ORAL_TABLET | Freq: Every day | ORAL | 1 refills | Status: DC

## 2018-01-07 NOTE — Patient Instructions (Addendum)
If you have lab work done today you will be contacted with your lab results within the next 2 weeks.  If you have not heard from [place] then please contact us. The fastest way to get your results is to register for My Chart.   IF you received an x-ray today, you will receive an invoice from [facility]. Please contact [facility] at [phone] with questions or concerns regarding your invoice.   IF you received labwork today, you will receive an invoice from [place]. Please contact [facility] at [phone] with questions or concerns regarding your invoice.   Our billing staff will not be able to assist you with questions regarding bills from these companies.  You will be contacted with the lab results as soon as they are available. The fastest way to get your results is to activate your My Chart account. Instructions are located on the last page of this paperwork. If you have not heard from [place] regarding the results in 2 weeks, please contact this office.      Opioid Pain Medicine Information Opioids are powerful medicines that are used to treat moderate to severe pain. Opioids should be taken with the supervision of a trained health care provider. They should be taken for the shortest period of time as possible. This is because opioids can be addictive and the longer you take opioids, the greater your risk of addiction (opioid use disorder). What do opioids do? Opioids help to reduce or eliminate pain. When used for short periods of time, they can help you:  Sleep better.  Do better in physical or occupational therapy.  Feel better in the first few days after an injury.  Recover from surgery.  What is a pain treatment plan? A pain treatment plan is an agreement between you and your health care provider. Pain is unique to each person, and treatments vary depending on your condition. To manage your pain successfully, you and your health care provider need to  understand each other and work together. To help you do this:  Discuss the goals of your treatment, including how much pain you might expect to have and how you will manage the pain.  Review the risks and benefits of taking opioid medicines for your condition.  Remember that a good treatment plan uses more than one approach and minimizes the chance of side effects.  Be honest about the amount of medicines you take, and about any drug or alcohol use.  Get pain medicine prescriptions from only one care provider.  Keep all follow-up visits as told by your health care provider. This is important.  What instructions should I follow while taking opioid pain medicine? While you are taking the medicine and for 8 hours after you stop taking the medicine, follow these instructions:  Do not drive.  Do not use machinery or power tools.  Do not sign legal documents.  Do not drink alcohol.  Do not take sleeping pills.  Do not supervise children by yourself.  Do not participate in activities that require climbing or being in high places.  Do not enter a body of water-such as a lake, river, ocean, spa, or swimming pool-unless an adult is nearby who can monitor and help you.  What kinds of side effects can opioids cause? Opioids can cause side effects, such as:  Constipation.  Nausea.  Vomiting.  Drowsiness.  Confusion.  Opioid use disorder.  Breathing difficulties (respiratory depression).  Using opioid pain medicines for longer  than 3 days increases your risk of these side effects. Taking opioid pain medicine for a long period of time can affect your ability to do daily tasks. It also puts you at risk for:  Motor vehicle accidents.  Depression.  Suicide.  Heart attack.  Overdose, which can sometimes lead to death.  What are alternative ways to manage pain? Pain can be managed with many types of alternative treatments. Ask your health care provider to refer you to one  or more specialists who can help you manage pain through:  Physical or occupational therapy.  Counseling (cognitive behavioral therapy).  Good nutrition.  Biofeedback.  Massage.  Meditation.  Non-opioid medicine.  Following a gentle exercise program.  How can I keep others safe while I am taking opioid pain medicine?  Keep pain medicine in a locked cabinet, or in a secure area where children cannot reach it.  Never share your pain medicine with anyone.  Do not save any leftover pills. If you have leftover medicine, you can: 1. Bring the medicine to a prescription take-back program. This is usually offered by the county or Event organiser. 2. Throw it out in the trash. To do this:  Mix the medicine with undesirable trash such as pet waste or food.  Put the mixture in a sealed container or plastic bag.  Throw it in the trash.  Destroy any personal information on the prescription bottle. How do I stop taking opioids if I have been taking them for a long time? If you have been taking opioid medicine for more than a few weeks, you may need to slowly decrease (taper) how much you take until you stop completely. Tapering your use of opioids can decrease your chances of experiencing withdrawal symptoms, such as:  Pain and cramping in the abdomen.  Nausea.  Sweating.  Sleepiness.  Restlessness.  Uncontrollable shaking (tremors).  Cravings for the medicine.  Do not attempt to taper your use of opioids on your own. Talk with your health care provider about how to do this. Your health care provider may prescribe a step-down schedule based on how much medicine you are taking and how long you have been taking it. Where to find support: If you have been taking opioids for a long time, you may benefit from receiving support for quitting from a local support group or counselor. Ask your health care provider for a referral to these resources in your area. Where to find more  information:  Centers for Disease Control and Prevention (CDC): [employer] Get help right away if: Seek medical care right away if you are taking opioids and you (or people close to you) notice any of the following:  Difficulty breathing.  Breathing that is slower or more shallow than normal.  A very slow heartbeat (pulse).  Severe confusion.  Unconsciousness.  Sleepiness.  Slurred speech.  Nausea and vomiting.  Cold, clammy skin.  Blue lips or fingernails.  Limpness.  Abnormally small pupils.  If you think that you or someone else may have taken too much of an opioid medicine, get medical help right away. Do not wait to see if the symptoms go away on their own.  If you ever feel like you may hurt yourself or others, or have thoughts about taking your own life, get help right away. You can go to your nearest emergency department or call:  Your local emergency services (911 in the [place]  The hotline of the [facility] 204-887-5492 in the [place]  A suicide crisis helpline, such as the [facility] at 7080426602. This is open 24 hours a day.  Summary  Opioid medicines can help you manage moderate to severe pain for a short period of time.  Discuss the goals of your treatment with your health care provider, including how much pain you might expect to have and how you will manage the pain.  A good treatment plan uses more than one approach. Pain can be managed with many types of alternative treatments.  If you think that you or someone else may have taken too much of an opioid, get medical help right away. This information is not intended to replace advice given to you by your health care provider. Make sure you discuss any questions you have with your health care provider. Document Released: 02/19/2015 Document Revised: 05/12/2016 Document Reviewed: 09/04/2014 Elsevier Interactive Patient  Education  [employer].

## 2018-01-07 NOTE — Telephone Encounter (Signed)
LMTCB

## 2018-01-07 NOTE — Progress Notes (Signed)
Subjective:    Patient: Elijah Marshall  DOB: [birth year]; 67 y.o.   MRN: [id number]  Chief Complaint  Patient presents with  . Hypertension    6 month follow-up   . Medication Management    HPI  Elijah Marshall is NOT fasting today - ate coffee and a little rice and ox-tails.  HTN: well-controlled on bisoprolol-hctz 2.5-6.25 qd.  BP has been running 120-128/85-90  History of stage 1A small cell right lung cancer in 2014 s/p wedge resection of RLL: Followed by Dr. Earlie Server, last seen November 18 -2 weeks ago.  He is to continue on observation with repeat CT scans of the chest annually.  COPD: Followed by Dr. worked last seen November 22-less than 2 weeks ago.  Last PFTs done May 22, 2017.  Chronic hip pain: Seen by Dr. Reynaldo Minium and determined no need for surgery at this point.  He is maintained on low-dose chronic narcotic therapy with tramadol #90 last filled 11/24/2017.  He never received the prescription that I wrote on 11/14.   Chronic constipation and rectal bleeding: Seen by Dr. Thayer Headings 2 months ago on October 4 recommended to have colonoscopy which does not appear to have been done.  He plans to sched this for 2020.  HLD:   On pravastatin 40. with improvement in last lipid panel. Lab Results  Component Value Date   LDLCALC 115 (H) 04/19/2017   [place] 124 08/26/2015    Anxiety: Since cancer diagnosis, continues to decrease well controlled on rare clonazepam.  Using it at night - at most 2-3 night before his CT scan due to anxiety that keeps him from sleep but more often just 1-2 nights/wks.  Medical History Past Medical History:  Diagnosis Date  . Allergy   . Arthritis   . BPH with obstruction/lower urinary tract symptoms 2016   Nocturia  . Cancer [facility])    unsure at this time  . Cancer of lower lobe of right lung ([place])   . COPD (chronic obstructive pulmonary disease) ([place])    per patient's health survey - he put a ?  . Depression   . Erectile dysfunction due to arterial  insufficiency   . GERD (gastroesophageal reflux disease)   . Hyperlipidemia   . Hypertension   . Non-small cell carcinoma of lung, right ([place]) 06/24/2015  . Shortness of breath   . Ulcer    Past Surgical History:  Procedure Laterality Date  . ANTERIOR FUSION LUMBAR SPINE  07/25/2012  . ANTERIOR LAT LUMBAR FUSION Left 07/25/2012   Procedure: ANTERIOR LATERAL LUMBAR FUSION 1 LEVEL;  Surgeon: [name], MD;  Location: [facility];  Service: Orthopedics;  Laterality: Left;  Left sided lumbar 3-4 lateral interbody fusion with allograft and instrumentation  . COLONOSCOPY  14   polyps rem  . JOINT REPLACEMENT Right 2010  . NODE DISSECTION Right 05/24/2015   Procedure: NODE DISSECTION;  Surgeon: [name], MD;  Location: [facility];  Service: Thoracic;  Laterality: Right;  . SEGMENTECOMY Right 05/24/2015   Procedure: RIGHT LOWER LOBE SUPERIOR SEGMENTECTOMY;  Surgeon: [name], MD;  Location: [facility];  Service: Thoracic;  Laterality: Right;  . [place]  2012  . VIDEO ASSISTED THORACOSCOPY (VATS)/WEDGE RESECTION Right 05/24/2015   Procedure: RIGHT VIDEO ASSISTED THORACOSCOPY (VATS)/ RIGHTR LOWER LOBE WEDGE RESECTION;  Surgeon: [name], MD;  Location: [facility];  Service: Thoracic;  Laterality: Right;   Current Outpatient Medications on File Prior to Visit  Medication Sig Dispense  Refill  . albuterol (VENTOLIN HFA) 108 (90 Base) MCG/ACT inhaler INHALE USING 2 PUFFS EVERY 4 HOURS AS NEEDED ONLY IF YOUR CAN'T CATCH YOUR BREATH 18 Inhaler 0  . bisoprolol-hydrochlorothiazide (ZIAC) 2.5-6.25 MG tablet TAKE 1 TABLET BY MOUTH EVERY DAY 90 tablet 1  . clonazePAM (KLONOPIN) 1 MG tablet Take 1 mg by mouth 2 (two) times daily as needed for anxiety.    . docusate sodium (STOOL SOFTENER) 100 MG capsule Per bottle as needed    . lansoprazole (PREVACID) 30 MG capsule Take 1 capsule (30 mg total) by mouth 2 (two) times daily. 180 capsule 1  . OXYGEN 2lpm with exertion only    .  PEG-KCl-NaCl-NaSulf-Na Asc-C (PLENVU) 140 g SOLR Take 140 g by mouth as directed. 1 each 0  . pravastatin (PRAVACHOL) 40 MG tablet Take 1 tablet (40 mg total) by mouth at bedtime. 90 tablet 3  . ranitidine (ZANTAC) 150 MG capsule Take 150 mg by mouth every evening.     . traMADol (ULTRAM) 50 MG tablet Take 1-2 tablets (50-100 mg total) by mouth every 8 (eight) hours as needed for moderate pain. **NEED F/U OFFICE VISIT FURTHER REFILLS** 90 tablet 0  . umeclidinium bromide (INCRUSE ELLIPTA) 62.5 MCG/INH AEPB Inhale 1 puff into the lungs daily. 1 each 11  . levocetirizine (XYZAL) 5 MG tablet Take 1 tablet (5 mg total) by mouth daily as needed (drippy nose). 30 tablet 3   No current facility-administered medications on file prior to visit.    Allergies  Allergen Reactions  . Aspirin Nausea And Vomiting  . Morphine And Related Itching   Family History  Problem Relation Age of Onset  . Colon cancer Mother   . Heart disease Father   . Diabetes Sister   . Hypertension Sister   . Cancer Sister   . Hypertension Brother   . Heart disease Maternal Grandmother        heart attack   Social History   Socioeconomic History  . Marital status: Married    Spouse name: Not on file  . Number of children: Not on file  . Years of education: Not on file  . Highest education level: High school graduate  Occupational History  . Occupation: truck Government social research officer: [employer]  . Financial resource strain: Not hard at all  . Food insecurity:    Worry: Never true    Inability: Never true  . Transportation needs:    Medical: No    Non-medical: No  Tobacco Use  . Smoking status: Former Smoker    Packs/day: 1.00    Years: 35.00    Pack years: 35.00    Types: Cigarettes    Last attempt to quit: 09/24/2011    Years since quitting: 6.2  . Smokeless tobacco: Never Used  Substance and Sexual Activity  . Alcohol use: No    Alcohol/week: 0.0 standard drinks    Comment:  weekly  . Drug use: No  . Sexual activity: Not on file  Lifestyle  . Physical activity:    Days per week: 4 days    Minutes per session: 30 min  . Stress: To some extent  Relationships  . Social connections:    Talks on phone: More than three times a week    Gets together: More than three times a week    Attends religious service: More than 4 times per year    Active member of club or organization: No  Attends meetings of clubs or organizations: Never    Relationship status: Married  Other Topics Concern  . Not on file  Social History Narrative  . Not on file   Depression screen [facility] 2/9 01/07/2018 07/19/2017 04/17/2017 02/19/2017 11/11/2015  Decreased Interest 0 1 0 0 0  Down, Depressed, Hopeless 0 0 '2 1 1  '$ PHQ - 2 Score 0 '1 2 1 1  '$ Altered sleeping - - 0 2 1  Tired, decreased energy - - 0 0 0  Change in appetite - - 0 0 0  Feeling bad or failure about yourself  - - 2 1 0  Trouble concentrating - - 0 - 0  Moving slowly or fidgety/restless - - 0 0 0  Suicidal thoughts - - 0 0 0  PHQ-9 Score - - '4 4 2  '$ Difficult doing work/chores - - Somewhat difficult Not difficult at all Not difficult at all  Some recent data might be hidden    Review of Systems  Respiratory: Positive for cough. Negative for hemoptysis.   Cardiovascular: Negative for orthopnea, claudication and leg swelling.  Musculoskeletal: Positive for back pain and joint pain.  All other systems reviewed and are negative.  As noted in HPI  Objective:  BP (!) 148/87   Pulse 68   Temp 98 F (36.7 C) (Oral)   Resp 16   Ht 5' 7.72" (1.72 m)   Wt 178 lb (80.7 kg)   SpO2 98%   BMI 27.29 kg/m  Physical Exam  Constitutional: He is oriented to person, place, and time. He appears well-developed and well-nourished. No distress.  HENT:  Head: Normocephalic and atraumatic.  Eyes: Pupils are equal, round, and reactive to light. Conjunctivae are normal. No scleral icterus.  Neck: Normal range of motion. Neck supple. No  thyromegaly present.  Cardiovascular: Normal rate, regular rhythm, normal heart sounds and intact distal pulses.  Pulmonary/Chest: Effort normal and breath sounds normal. No respiratory distress.  Musculoskeletal: He exhibits no edema.  Lymphadenopathy:    He has no cervical adenopathy.  Neurological: He is alert and oriented to person, place, and time.  Skin: Skin is warm and dry. He is not diaphoretic.  Psychiatric: He has a normal mood and affect. His behavior is normal.    POC TESTING No visits with results within 3 Day(s) from this visit.  Latest known visit with results is:  Appointment on 12/21/2017  Component Date Value Ref Range Status  . Sodium 12/21/2017 140  135 - 145 mmol/L Final  . Potassium 12/21/2017 3.7  3.5 - 5.1 mmol/L Final  . Chloride 12/21/2017 102  98 - 111 mmol/L Final  . CO2 12/21/2017 28  22 - 32 mmol/L Final  . Glucose, Bld 12/21/2017 97  70 - 99 mg/dL Final  . BUN 12/21/2017 8  8 - 23 mg/dL Final  . Creatinine, Ser 12/21/2017 1.19  0.61 - 1.24 mg/dL Final  . Calcium 12/21/2017 9.4  8.9 - 10.3 mg/dL Final  . Total Protein 12/21/2017 7.2  6.5 - 8.1 g/dL Final  . Albumin 12/21/2017 4.1  3.5 - 5.0 g/dL Final  . AST 12/21/2017 22  15 - 41 U/L Final  . ALT 12/21/2017 14  0 - 44 U/L Final  . Alkaline Phosphatase 12/21/2017 98  38 - 126 U/L Final  . Total Bilirubin 12/21/2017 0.7  0.3 - 1.2 mg/dL Final  . GFR calc non Af Amer 12/21/2017 >60  >60 mL/min Final  . GFR calc Af Amer 12/21/2017 >60  >  60 mL/min Final   Comment: (NOTE) The eGFR has been calculated using the CKD EPI equation. This calculation has not been validated in all clinical situations. eGFR's persistently <60 mL/min signify possible Chronic Kidney Disease.   Georgiann Hahn gap 12/21/2017 10  5 - 15 Final   Performed at [facility], [place] [address], [place], [place] [address]  . WBC 12/21/2017 6.6  4.0 - 10.5 K/uL Final  . RBC 12/21/2017 5.62  4.22 - 5.81 MIL/uL Final  .  Hemoglobin 12/21/2017 16.3  13.0 - 17.0 g/dL Final  . HCT 12/21/2017 48.5  39.0 - 52.0 % Final  . MCV 12/21/2017 86.3  80.0 - 100.0 fL Final  . MCH 12/21/2017 29.0  26.0 - 34.0 pg Final  . MCHC 12/21/2017 33.6  30.0 - 36.0 g/dL Final  . RDW 12/21/2017 13.3  11.5 - 15.5 % Final  . Platelets 12/21/2017 232  150 - 400 K/uL Final  . nRBC 12/21/2017 0.0  0.0 - 0.2 % Final  . Neutrophils Relative % 12/21/2017 44  % Final  . Neutro Abs 12/21/2017 2.9  1.7 - 7.7 K/uL Final  . Lymphocytes Relative 12/21/2017 40  % Final  . Lymphs Abs 12/21/2017 2.7  0.7 - 4.0 K/uL Final  . Monocytes Relative 12/21/2017 12  % Final  . Monocytes Absolute 12/21/2017 0.8  0.1 - 1.0 K/uL Final  . Eosinophils Relative 12/21/2017 3  % Final  . Eosinophils Absolute 12/21/2017 0.2  0.0 - 0.5 K/uL Final  . Basophils Relative 12/21/2017 1  % Final  . Basophils Absolute 12/21/2017 0.1  0.0 - 0.1 K/uL Final  . Immature Granulocytes 12/21/2017 0  % Final  . Abs Immature Granulocytes 12/21/2017 0.01  0.00 - 0.07 K/uL Final   Performed at [facility], [place] [address] [place], [place] [address]     Assessment & Plan:   1. Essential hypertension -Home BP not ideally controlled so increase bisoprolol HCTZ from 2.5-6.25 to 5-6.25  2. Chronic rhinitis   3. Gastroesophageal reflux disease, esophagitis presence not specified -continue twice daily lansoprazole and change nightly ranitidine to famotidine  4. Pure hypercholesterolemia -well-controlled on pravastatin 40.  Check fasting lipid panel at next office with a chest-CRP  5. Arthritis   6. Coronary arteriosclerosis in native artery   7. Chronic pain syndrome -has become tolerant to tramadol 50 3 times daily after to 3 years on use and requests trial of something stronger.  Will change to hydrocodone 5 3 times daily.  If patient does not like this or is intolerant, discuss changing back to tramadol but increasing the dose to 100 mg 3 times daily.  Patient  will then follow-up with initially lumbar surgeon [name] hip/knee surgeon Dr. Reynaldo Minium if needed to discuss next steps for surgical intervention due to severity of pain.  8. Chronic arthralgias of knees and hips   9. Rectal bleeding -will schedule colonoscopy  10. Primary insomnia -try amitriptyline now but cannot be on clonazepam  11. History of lumbar surgery -plan to follow-up with [name] to ensure prior surgery is not having effect on worsening hip and knee pain    Arteriosclorsis so fasting at next visit for repeat lipids and at bedtime-CRP  Follow-up in 3 months for refills of hydrocodone and to ensure new PRN sleep medicine of amitriptyline is working well.  Amitriptyline chosen due to hopeful positive effects on anxiety and pain as well.  Patient is going to schedule colonoscopy  ASAP in 2020.  He is going to follow-up with [name] to ensure that his spine and prior spinal surgery does not have an effect on his current hip pain.  If [name] determines that it does not, then he plans to follow-up with Dr. Reynaldo Minium to discuss hip surgery.  Reviewed importance of surgery within the next 5 years or so considering he became intolerant to the tramadol for pain control effects after approximately 2 to 3 years.  Explained that as we increase potency of narcotics by increasing hydrocodone today that this has to be done due to the tolerance he is reached in this tolerance will continue to happen on what ever medicine he is on every several years but at some point we will recheck a threshold where he will be unsafe to increase further and I do not want this to be in 10 years when he no longer is a surgical candidate.  Explained the importance of pain control so that he can stay active for his cardiac and pulmonary pulmonary health.  At this point controlling his COPD is most about staying on his maintenance medications but also maintaining activity and muscular strength to decrease his oxygen  requirements.  Reviewed now that on hydrocodone, will have to be seen every 3 months for refills.  Explained that new prescription for the next 3 months will be a follow-up pharmacy.  Explained that can no longer be on clonazepam one on hydrocodone due to interactions with CNS and respiratory depression between opioids and benzodiazepines -even though he is taking the clonazepam very rarely.  He never received the last refill of the tramadol that I sent in in November 14.  He was informed by our call center when he called and that I would not refill this medication despite the fact that in my note I said I would and in fact I did.  However it appears it was printed so never received by his pharmacy.  Complaining of not being able to get up his phlegm all the time though staying on Mucinex DM and drinking lots of water.  Advised discontinuing PDM portion and staying on at least 2400 mg of guaifenesin daily along with increasing fluids including hot tea.  Change to famotidine from ranitidine due to recall.  Needs to stay on twice daily PPI due to findings indicative of possible Aspiration on his recent chest CT. Today I have utilized the [place] Controlled Substance Registry's online query to confirm compliance regarding the patient's controlled medications. My review reveals appropriate prescription fills and that I am the sole provider of these medications. Rechecks will occur regularly and the patient is aware of our use of the system.  Patient will continue on current chronic medications other than changes noted above, so ok to refill when needed.   See after visit summary for patient specific instructions.  Meds ordered this encounter  Medications  . famotidine (PEPCID) 40 MG tablet    Sig: Take 1 tablet (40 mg total) by mouth 2 (two) times daily.    Dispense:  180 tablet    Refill:  3  . lansoprazole (PREVACID) 30 MG capsule    Sig: Take 1 capsule (30 mg total) by mouth 2 (two) times daily before a  meal.    Dispense:  180 capsule    Refill:  1    Pt due for refill now - please dispense - appears to have been getting wrong sig with just enough for qd  .  amitriptyline (ELAVIL) 25 MG tablet    Sig: Take 1 tablet (25 mg total) by mouth at bedtime as needed for sleep.    Dispense:  90 tablet    Refill:  0  . HYDROcodone-acetaminophen (NORCO/VICODIN) 5-325 MG tablet    Sig: Take 1 tablet by mouth every 8 (eight) hours as needed for moderate pain.    Dispense:  90 tablet    Refill:  0  . HYDROcodone-acetaminophen (NORCO/VICODIN) 5-325 MG tablet    Sig: Take 1 tablet by mouth every 8 (eight) hours as needed for moderate pain.    Dispense:  90 tablet    Refill:  0  . HYDROcodone-acetaminophen (NORCO/VICODIN) 5-325 MG tablet    Sig: Take 1 tablet by mouth every 8 (eight) hours as needed for moderate pain.    Dispense:  90 tablet    Refill:  0  . bisoprolol-hydrochlorothiazide (ZIAC) 5-6.25 MG tablet    Sig: Take 1 tablet by mouth daily.    Dispense:  90 tablet    Refill:  1    Patient verbalized to me that they understand the following: diagnosis, what is being done for them, what to expect and what should be done at home.  Their questions have been answered. They understand that I am unable to predict every possible medication interaction or adverse outcome and that if any unexpected symptoms arise, they should contact us and their pharmacist, as well as never hesitate to seek urgent/emergent care at [facility] or ER if they think it might be warranted.    [name], MD, MPH Primary Care at [place] [address] [place], [place]  [address] [phone] Office phone  [phone] Office fax  01/07/18 9:33 AM

## 2018-01-07 NOTE — Telephone Encounter (Signed)
Patient is aware ans nothing further is needed at this time.

## 2018-01-07 NOTE — Telephone Encounter (Signed)
patient returned phone call/ pt contact # [phone]

## 2018-02-16 DIAGNOSIS — J449 Chronic obstructive pulmonary disease, unspecified: Secondary | ICD-10-CM | POA: Diagnosis not present

## 2018-02-18 ENCOUNTER — Other Ambulatory Visit: Payer: Self-pay | Admitting: Emergency Medicine

## 2018-02-28 ENCOUNTER — Telehealth: Payer: Self-pay | Admitting: Family Medicine

## 2018-02-28 DIAGNOSIS — M25552 Pain in left hip: Secondary | ICD-10-CM

## 2018-02-28 DIAGNOSIS — M25561 Pain in right knee: Secondary | ICD-10-CM

## 2018-02-28 DIAGNOSIS — M1990 Unspecified osteoarthritis, unspecified site: Secondary | ICD-10-CM

## 2018-02-28 DIAGNOSIS — G894 Chronic pain syndrome: Secondary | ICD-10-CM

## 2018-02-28 DIAGNOSIS — M25551 Pain in right hip: Secondary | ICD-10-CM

## 2018-02-28 DIAGNOSIS — G8929 Other chronic pain: Secondary | ICD-10-CM

## 2018-02-28 DIAGNOSIS — M25562 Pain in left knee: Secondary | ICD-10-CM

## 2018-02-28 NOTE — Telephone Encounter (Signed)
Copied from [place]. Topic: Quick Communication - Rx Refill/Question >> Feb 28, 2018  4:41 PM Reyne Dumas L wrote: Medication: HYDROcodone-acetaminophen (NORCO/VICODIN) 5-325 MG tablet  Has the patient contacted their pharmacy? Yes - states they need to hear from the office before they can refill (Agent: If no, request that the patient contact the pharmacy for the refill.) (Agent: If yes, when and what did the pharmacy advise?)  Preferred Pharmacy (with phone number or street name): [id number] [place], [place] [phone] (Phone) [phone] (Fax)  Agent: Please be advised that RX refills may take up to 3 business days. We ask that you follow-up with your pharmacy.

## 2018-03-01 ENCOUNTER — Ambulatory Visit: Payer: Medicare Other | Admitting: Adult Health

## 2018-03-01 ENCOUNTER — Telehealth: Payer: Self-pay | Admitting: Internal Medicine

## 2018-03-01 ENCOUNTER — Encounter: Payer: Self-pay | Admitting: Adult Health

## 2018-03-01 DIAGNOSIS — C3491 Malignant neoplasm of unspecified part of right bronchus or lung: Secondary | ICD-10-CM

## 2018-03-01 DIAGNOSIS — J310 Chronic rhinitis: Secondary | ICD-10-CM | POA: Diagnosis not present

## 2018-03-01 DIAGNOSIS — J449 Chronic obstructive pulmonary disease, unspecified: Secondary | ICD-10-CM

## 2018-03-01 DIAGNOSIS — J9611 Chronic respiratory failure with hypoxia: Secondary | ICD-10-CM | POA: Diagnosis not present

## 2018-03-01 MED ORDER — ALBUTEROL SULFATE HFA 108 (90 BASE) MCG/ACT IN AERS: INHALATION_SPRAY | RESPIRATORY_TRACT | 0 refills | Status: DC

## 2018-03-01 MED ORDER — LEVALBUTEROL HCL 0.63 MG/3ML IN NEBU
0.6300 mg | INHALATION_SOLUTION | RESPIRATORY_TRACT | Status: AC
  Administered 2018-03-01: 0.63 mg via RESPIRATORY_TRACT

## 2018-03-01 MED ORDER — DOXYCYCLINE HYCLATE 100 MG PO TABS: 100.0000 mg | ORAL_TABLET | Freq: Two times a day (BID) | ORAL | 0 refills | Status: DC

## 2018-03-01 NOTE — Addendum Note (Signed)
Addended by: Karmen Stabs on: 03/01/2018 03:23 PM   Modules accepted: Orders

## 2018-03-01 NOTE — Telephone Encounter (Signed)
Primary Pulmonologist: Wert Last office visit and with whom: 02/21/2017 What do we see them for (pulmonary problems): COPD Last OV assessment/plan: Patient Instructions by [name], MD at 02/21/2017 1:30 PM  Author: [name], MD Author Type: Physician Filed: 02/21/2017 1:55 PM  Note Status: Addendum Cosign: Cosign Not Required Encounter Date: 02/21/2017  Editor: [name], MD (Physician)  Prior Versions: 1. [name], MD (Physician) at 02/21/2017 1:54 PM - Addendum   2. [name], MD (Physician) at 02/21/2017 1:51 PM - Signed    Goal is to keep your 02 sats above 90% while walking   Try Plan A = Automatic = Bevespi Take 2 puffs first thing in am and then another 2 puffs about 12 hours later.     Plan B = Backup Only use your albuterol (Ventolin) as a rescue medication to be used if you can't catch your breath by resting or doing a relaxed purse lip breathing pattern.  - The less you use it, the better it will work when you need it. - Ok to use the inhaler up to 2 puffs  every 4 hours if you must but call for appointment if use goes up over your usual need - Don't leave home without it !!  (think of it like the spare tire for your car)     Work on inhaler technique:  relax and gently blow all the way out then take a nice smooth deep breath back in, triggering the inhaler at same time you start breathing in.  Hold for up to 5 seconds if you can. Blow out thru nose. Rinse and gargle with water when done      Please schedule a follow up visit in 3 months but call sooner if needed with pfts on return        Was appointment offered to patient (explain)?  Yes, made an appt with TP at 11:15 today 03/01/2018   Reason for call: Throat is sore on right side and soreness on his jawbone going into his neck. He has a runny nose and coughing up white/yellow phlegm. The symptoms started about a week but jaw soreness has been since yesterday.   (examples of things  to ask: : When did symptoms start? Fever? Cough? Productive? Color to sputum? More sputum than usual? Wheezing? Have you needed increased oxygen? Are you taking your respiratory medications? What over the counter measures have you tried?)  Nothing further is needed.

## 2018-03-01 NOTE — Assessment & Plan Note (Signed)
Cont on O2 with activity as needed.

## 2018-03-01 NOTE — Telephone Encounter (Signed)
Patient is requesting a refill of the following medications:  Hydrocodone acetaminophen (Norco/Vicodin) 5-325 mg tablet  Date of patient request: 02/28/2018 Last office visit: 01/07/2018 Date of last refill: 01/07/2018 with a up coming refill on 03/04/2018 Last refill amount: 90 Follow up time period per chart: 04/08/2018  Pt states due to his pain he has been taking a little more then what is prescribed> pt states that he's out and would like medication refill today.

## 2018-03-01 NOTE — Assessment & Plan Note (Signed)
Surveillance CT showed no evidence of recurrence or metastatic disease on CT chest November 2019 Continue surveillance and follow-up with oncology

## 2018-03-01 NOTE — Assessment & Plan Note (Addendum)
Flare with URI  Xopenex nebulizer given in the office today  Plan  Patient Instructions  Doxycycline 100mg  Twice daily  For 1 week , take with food.  Mucinex DM Twice daily  As needed  Cough/congestion  Saline nasal rinses As needed   Salt water gargles As needed   Xyzal daily for 1 week then As needed   Continue on INCRUSE 1 puff daily , rinse after use.  Follow up with Dr. Melvyn Novas  As planned and As needed   Please contact office for sooner follow up if symptoms do not improve or worsen or seek emergency care

## 2018-03-01 NOTE — Assessment & Plan Note (Signed)
Mild flare   Plan  Patient Instructions  Doxycycline 100mg  Twice daily  For 1 week , take with food.  Mucinex DM Twice daily  As needed  Cough/congestion  Saline nasal rinses As needed   Salt water gargles As needed   Xyzal daily for 1 week then As needed   Continue on INCRUSE 1 puff daily , rinse after use.  Follow up with Dr. Melvyn Novas  As planned and As needed   Please contact office for sooner follow up if symptoms do not improve or worsen or seek emergency care

## 2018-03-01 NOTE — Addendum Note (Signed)
Addended by: Karmen Stabs on: 03/01/2018 12:29 PM   Modules accepted: Orders

## 2018-03-01 NOTE — Progress Notes (Signed)
@Patient  ID: Elijah Marshall, male    DOB: 04-21-1950, 68 y.o.   MRN: [id number]  Chief Complaint  Patient presents with  . Acute Visit    COPD     Referring provider: [name], MD  HPI: 68 year old male former smoker, quit in 2013, followed for GOLD III COPD and Oxygen dependent respiratory failure on oxygen with activity History of stage Ia non-small cell carcinoma of the right lung status post resection 2014   TEST/EVENTS :  Spirometry  03/01/12  FEV1  1.63 - Spirometry   09/05/13 FEV1  1.34 (46%) ratio 44  - PFTs   10/29/2013    FEV1    1.70 (62%) ratio 50% no better after ssaba and DLCO 48%  - PFT's  01/10/2016  FEV1 1.38 (51 % ) ratio 47  - PFT's  05/22/2017  FEV1 1.21  (46 % ) ratio 46  p 1 % improvement from saba p anoro and albuterol 1 h prior to study with DLCO  38 % corrects to 49 % for alv volume   - changed to Incruse 06/04/17 due to tremors > resolved     03/01/2018 Acute OV : COPD  Patient presents for an acute office visit.  Patient complains of increased cough with thick white to yellow mucus.  Sore throat, painful swallowing and nasal drainage.  Patient is had increased cough and wheezing.  Has had increased albuterol over the last 2 days.  Patient denies any fever, body aches, nausea vomiting diarrhea.  O2 saturations are good on arrival to the office today.  At 95% on room air. Says oxygen level have been good at home. Used Mucinex with minimal improvement.   Has GOLD III COPD on Incruse.  Patient has a history of lung cancer stage Ia status post resection 2014.  He had surveillance CT chest November 2019 that showed stable postsurgical changes of the right lower lobe no evidence of recurrence or metastatic disease.,  Moderate to severe emphysema.  Allergies  Allergen Reactions  . Aspirin Nausea And Vomiting  . Morphine And Related Itching    Immunization History  Administered Date(s) Administered  . Influenza, High Dose Seasonal PF 11/21/2016, 11/16/2017  .  Influenza,inj,Quad PF,6+ Mos 01/15/2015  . Influenza-Unspecified 11/02/2015  . Pneumococcal Conjugate-13 04/17/2017  . Pneumococcal Polysaccharide-23 07/26/2012    Past Medical History:  Diagnosis Date  . Allergy   . Arthritis   . BPH with obstruction/lower urinary tract symptoms 2016   Nocturia  . Cancer [facility])    unsure at this time  . Cancer of lower lobe of right lung ([place])   . COPD (chronic obstructive pulmonary disease) ([place])    per patient's health survey - he put a ?  . Depression   . Erectile dysfunction due to arterial insufficiency   . GERD (gastroesophageal reflux disease)   . Hyperlipidemia   . Hypertension   . Non-small cell carcinoma of lung, right ([place]) 06/24/2015  . Shortness of breath   . Ulcer     Tobacco History: Social History   Tobacco Use  Smoking Status Former Smoker  . Packs/day: 1.00  . Years: 35.00  . Pack years: 35.00  . Types: Cigarettes  . Last attempt to quit: 09/24/2011  . Years since quitting: 6.4  Smokeless Tobacco Never Used   Counseling given: Not Answered   Outpatient Medications Prior to Visit  Medication Sig Dispense Refill  . albuterol (VENTOLIN HFA) 108 (90 Base) MCG/ACT inhaler INHALE USING 2 PUFFS  EVERY 4 HOURS AS NEEDED ONLY IF YOUR CAN'T CATCH YOUR BREATH 18 Inhaler 0  . amitriptyline (ELAVIL) 25 MG tablet Take 1 tablet (25 mg total) by mouth at bedtime as needed for sleep. 90 tablet 0  . bisoprolol-hydrochlorothiazide (ZIAC) 5-6.25 MG tablet Take 1 tablet by mouth daily. 90 tablet 1  . docusate sodium (STOOL SOFTENER) 100 MG capsule Per bottle as needed    . famotidine (PEPCID) 40 MG tablet Take 1 tablet (40 mg total) by mouth 2 (two) times daily. 180 tablet 3  . HYDROcodone-acetaminophen (NORCO/VICODIN) 5-325 MG tablet Take 1 tablet by mouth every 8 (eight) hours as needed for moderate pain. 90 tablet 0  . [START ON 03/04/2018] HYDROcodone-acetaminophen (NORCO/VICODIN) 5-325 MG tablet Take 1 tablet by mouth every 8 (eight)  hours as needed for moderate pain. 90 tablet 0  . HYDROcodone-acetaminophen (NORCO/VICODIN) 5-325 MG tablet Take 1 tablet by mouth every 8 (eight) hours as needed for moderate pain. 90 tablet 0  . lansoprazole (PREVACID) 30 MG capsule Take 1 capsule (30 mg total) by mouth 2 (two) times daily before a meal. 180 capsule 1  . levocetirizine (XYZAL) 5 MG tablet TAKE 1 TABLET BY MOUTH EVERY DAY AS NEEDED FOR DRIPPY NOSE 90 tablet 1  . OXYGEN 2lpm with exertion only    . PEG-KCl-NaCl-NaSulf-Na Asc-C (PLENVU) 140 g SOLR Take 140 g by mouth as directed. 1 each 0  . pravastatin (PRAVACHOL) 40 MG tablet Take 1 tablet (40 mg total) by mouth at bedtime. 90 tablet 3  . umeclidinium bromide (INCRUSE ELLIPTA) 62.5 MCG/INH AEPB Inhale 1 puff into the lungs daily. 1 each 11   No facility-administered medications prior to visit.      Review of Systems:   Constitutional:   No  weight loss, night sweats,  Fevers, chills, fatigue, or  lassitude.  HEENT:   No headaches,  Difficulty swallowing,  Tooth/dental problems, or  Sore throat,                No sneezing, itching, ear ache,  +nasal congestion, post nasal drip,   CV:  No chest pain,  Orthopnea, PND, swelling in lower extremities, anasarca, dizziness, palpitations, syncope.   GI  No heartburn, indigestion, abdominal pain, nausea, vomiting, diarrhea, change in bowel habits, loss of appetite, bloody stools.   Resp:   No chest wall deformity  Skin: no rash or lesions.  GU: no dysuria, change in color of urine, no urgency or frequency.  No flank pain, no hematuria   MS:  No joint pain or swelling.  No decreased range of motion.  No back pain.    Physical Exam  BP 120/86 (BP Location: Left Arm, Cuff Size: Normal)   Pulse 91   Temp 98.2 F (36.8 C)   Ht 5\' 9"  (1.753 m)   Wt 185 lb 12.8 oz (84.3 kg)   SpO2 95%   BMI 27.44 kg/m   GEN: A/Ox3; pleasant , NAD, elderly    HEENT:  Warm [place]/AT,  EACs-clear, TMs-wnl, NOSE-clear drainage , THROAT-clear, no  lesions, no postnasal drip or exudate noted.   NECK:  Supple w/ fair ROM; no JVD; normal carotid impulses w/o bruits; no thyromegaly or nodules palpated; no lymphadenopathy.    RESP  CTA , slight decreased BS in bases   no accessory muscle use, no dullness to percussion  CARD:  RRR, no m/r/g, no peripheral edema, pulses intact, no cyanosis or clubbing.  GI:   Soft & nt; nml bowel sounds; no  organomegaly or masses detected.   Musco: Warm bil, no deformities or joint swelling noted.   Neuro: alert, no focal deficits noted.    Skin: Warm, no lesions or rashes    Lab Results:  CBC  BMET  BNP No results found for: BNP  ProBNP No results found for: PROBNP  Imaging: No results found.    PFT Results Latest Ref Rng & Units 05/22/2017 01/10/2016 10/29/2013  FVC-Pre L 2.57 2.90 3.43  FVC-Predicted Pre % 74 83 97  FVC-Post L 2.66 2.91 3.39  FVC-Predicted Post % 76 83 96  Pre FEV1/FVC % % 46 47 50  Post FEV1/FCV % % 46 47 51  FEV1-Pre L 1.19 1.36 1.70  FEV1-Predicted Pre % 45 51 62  FEV1-Post L 1.21 1.38 1.71  DLCO UNC% % 38 43 48  DLCO COR %Predicted % 49 47 53  TLC L 6.83 6.87 5.25  TLC % Predicted % 106 107 82  RV % Predicted % 162 152 62    No results found for: NITRICOXIDE      Assessment & Plan:   COPD GOLD III Flare with URI  Xopenex nebulizer given in the office today  Plan  Patient Instructions  Doxycycline 100mg  Twice daily  For 1 week , take with food.  Mucinex DM Twice daily  As needed  Cough/congestion  Saline nasal rinses As needed   Salt water gargles As needed   Xyzal daily for 1 week then As needed   Continue on INCRUSE 1 puff daily , rinse after use.  Follow up with Dr. Melvyn Novas  As planned and As needed   Please contact office for sooner follow up if symptoms do not improve or worsen or seek emergency care       Chronic rhinitis Mild flare   Plan  Patient Instructions  Doxycycline 100mg  Twice daily  For 1 week , take with food.    Mucinex DM Twice daily  As needed  Cough/congestion  Saline nasal rinses As needed   Salt water gargles As needed   Xyzal daily for 1 week then As needed   Continue on INCRUSE 1 puff daily , rinse after use.  Follow up with Dr. Melvyn Novas  As planned and As needed   Please contact office for sooner follow up if symptoms do not improve or worsen or seek emergency care       Chronic respiratory failure with hypoxia ([place]) Cont on O2 with activity as needed.   Non-small cell carcinoma of lung, right [facility]) Surveillance CT showed no evidence of recurrence or metastatic disease on CT chest November 2019 Continue surveillance and follow-up with oncology     Rexene Edison, NP 03/01/2018

## 2018-03-01 NOTE — Patient Instructions (Addendum)
Doxycycline 100mg  Twice daily  For 1 week , take with food.  Mucinex DM Twice daily  As needed  Cough/congestion  Saline nasal rinses As needed   Salt water gargles As needed   Xyzal daily for 1 week then As needed   Continue on INCRUSE 1 puff daily , rinse after use.  Follow up with Dr. Melvyn Novas  As planned and As needed   Please contact office for sooner follow up if symptoms do not improve or worsen or seek emergency care

## 2018-03-14 NOTE — Addendum Note (Signed)
Addended by: [name] on: 03/14/2018 05:52 PM   Modules accepted: Orders

## 2018-03-14 NOTE — Telephone Encounter (Signed)
Please explain to pt that at his last visit on 12/2 - I sent 3 different prescriptions to his hydrocodone to his pharmacy - he has only filled the initial one per the [place] drug database so his pharmacy still has 2 different rxs on file for #90 hydrocodone - both of which are now eligible to be filled (as couldn't be filled until on/or after 12/30, and 1/27).  We can't "refill" these meds - each fill requires a new rx. Therefore, he can't call in for a refill using automated pharmacy refill system or use the rx number on his current med bottle when requesting refill.  He will have to call the pharmacy and leave a voice message or speak to someone directly to ask them to fill a hydrocodone rx which they have on file for him since sent on over 12/2 and is now eligible to be filled.  He can do this twice before needing f/u OV (but recommend seeing me prior to 4/3 as likely my last day in clinic.)  Also, due to my resignation and none of my partners currently providing chronic pain management, I have referred him to a pain clinic for ongoing management which he will likely need towards END OF summer - so consider scheduling appt ~ August with [facility] (either at [facility] or [facility] - whatever is most convenient for him).

## 2018-03-16 NOTE — Telephone Encounter (Signed)
Spoke with pt and advised him of refills being at pharmacy, he verbalized understanding.

## 2018-03-18 NOTE — Progress Notes (Signed)
Chart and office note reviewed in detail  > agree with a/p as outlined    

## 2018-03-19 DIAGNOSIS — J449 Chronic obstructive pulmonary disease, unspecified: Secondary | ICD-10-CM | POA: Diagnosis not present

## 2018-03-20 ENCOUNTER — Telehealth: Payer: Self-pay | Admitting: Internal Medicine

## 2018-03-20 NOTE — Telephone Encounter (Signed)
I have called the patient he states that he is having stomach pain and bloating and discomfort. I advised him it would be best if he called his pcp to deal with GI issue since  we are a lung specialty.  He verbalized understanding nothing further is need at this time.

## 2018-03-31 ENCOUNTER — Other Ambulatory Visit: Payer: Self-pay | Admitting: Family Medicine

## 2018-04-01 ENCOUNTER — Ambulatory Visit (INDEPENDENT_AMBULATORY_CARE_PROVIDER_SITE_OTHER): Payer: Medicare Other | Admitting: Internal Medicine

## 2018-04-01 ENCOUNTER — Encounter: Payer: Self-pay | Admitting: Internal Medicine

## 2018-04-01 DIAGNOSIS — J449 Chronic obstructive pulmonary disease, unspecified: Secondary | ICD-10-CM

## 2018-04-01 DIAGNOSIS — J9611 Chronic respiratory failure with hypoxia: Secondary | ICD-10-CM | POA: Diagnosis not present

## 2018-04-01 MED ORDER — GLYCOPYRROLATE-FORMOTEROL 9-4.8 MCG/ACT IN AERO: 2.0000 | INHALATION_SPRAY | Freq: Two times a day (BID) | RESPIRATORY_TRACT | 11 refills | Status: DC

## 2018-04-01 NOTE — Telephone Encounter (Signed)
Requested Prescriptions  Pending Prescriptions Disp Refills  . amitriptyline (ELAVIL) 25 MG tablet [Pharmacy Med Name: AMITRIPTYLINE HCL 25 MG TAB] 90 tablet 0    Sig: TAKE 1 TABLET (25 MG TOTAL) BY MOUTH AT BEDTIME AS NEEDED FOR SLEEP.     Psychiatry:  Antidepressants - Heterocyclics (TCAs) Passed - 03/31/2018  9:08 AM      Passed - Completed PHQ-2 or PHQ-9 in the last 360 days.      Passed - Valid encounter within last 6 months    Recent Outpatient Visits          2 months ago Essential hypertension   [facility], Laurey Arrow, MD   4 months ago Arthralgia of both hands   [facility] at [facility], Ines Bloomer, MD   8 months ago Abnormal colonoscopy   [facility], Laurey Arrow, MD   11 months ago Essential hypertension   [facility], Laurey Arrow, MD   11 months ago Essential hypertension   [facility], Laurey Arrow, MD      Future Appointments            In 1 week [name], MD [facility] at [place], [facility]

## 2018-04-01 NOTE — Progress Notes (Signed)
Subjective:   Patient ID: Elijah Marshall, male    DOB: Oct 09, 1950  MRN: [id number]   Brief patient profile:  68  yobm quit smoking 2013  and able work out at [employer] and did fine until hurt back 2011 then 2 back surgeries and knee surgery  and more noticeable doe  since then but also limited by back and s/p sup segmentectomy RLL NSC lung ca Ia 05/2015  referred to pulmonary clinic 09/05/2013 for ? Copd  With GOLD II criteria 10/29/13 and confirmed 01/10/2016     History of Present Illness  09/05/2013 1st [facility] office visit/ Elijah Marshall  Chief Complaint  Patient presents with  . Pulmonary Consult    Referred per Dr. Norberto Marshall.  Pt c/o SOB for the past 3 yrs. He states that he was dxed with COPD back in 2012 or 2013.  He states that that he sometimes has trouble with breathing when walking up stairs and lifting things.    rx spiriva first, alb, dulera not as effective  No problem at rest or supine Assoc nasal congestion much better p shot (?depomedrol) and bad again x sev years corresponds to worse doe  Ex = walking one mile slower pace than wife,trouble with hills, worse in heat  Bad hb even on dexilant  Min dry cough  rec Stop spiriva  Start anoro two puffs off one click each am Zantac 150 mg one at  Bedtime Prednisone 10 mg take  4 each am x 2 days,   2 each am x 2 days,  1 each am x 2 days and stop  GERD diet    04/19/2017  f/u ov/Elijah Marshall re:  GOLD II copd /  Chief Complaint  Patient presents with  . Follow-up    Patient was told to follow up with formulary in hand to discuss medications.   Dyspnea:   improved with walking back up  to 30 min on 2lpm  Cough: none Sleep: no resp problems SABA use:  No change on bevespi vs symbicort / concerned with cost issues rec Plan A = Automatic = Bevespi 2 every 12 hours or ANORO one click each am  But pick the one that works the best for you and if they are both the same then chose the less costly  Plan B = Backup Only use your albuterol  as a rescue medication   Could not tol anoro due to tremors > resolved on Incruse        12/28/2017  f/u ov/Elijah Marshall re:  GOLD III/ incruse maint and 02 on 2lpm ex / no med calendar  Chief Complaint  Patient presents with  . Follow-up    No more hemoptysis since last visit. He is using his albuterol inhaler 3 x per wk on average.   Dyspnea:  Baseline = MMRC2 = can't walk a nl pace on a flat grade s sob but does fine slow and flat on 2lpm  Cough: improved/ min mucoid no disturbing sleep  Sleeping: flat SABA use: minimal use but could not tell med when to start or how often he can use saba if worse (instructions are on action plan of med calendar he forgot to bring)  rec Please schedule a follow up visit in 3 months but call sooner if needed  - bring your medication calendar and drug formulary so we can see if there is a cheaper alternative under your present drug plan  -add: advise that p review of ct  chest he needs to call for any recurrent hemoptysis to arrange f/u    03/01/18 NP eval Doxycycline 100mg  Twice daily  For 1 week , take with food.  Mucinex DM Twice daily  As needed  Cough/congestion  Saline nasal rinses As needed   Salt water gargles As needed   Xyzal daily for 1 week then As needed   Continue on INCRUSE 1 puff daily , rinse after use.  Follow up with Dr. Sherene Marshall  As planned and As needed      04/01/2018  f/u ov/Elijah Marshall re: GOLD III/ maint incruse as cannot tol laba/ 02 2lpm with ex / no med calendar  Chief Complaint  Patient presents with  . Follow-up    Breathing is about the same. He is using his rescue inhaler about 3 x per wk.   Dyspnea:  MMRC2 = can't walk a nl pace on a flat grade s sob but does fine slow and flat  Cough: occ Sleeping: on side/ bed is flat/ 2 pillows SABA use: as above 02: 2lpm prn    No obvious day to day or daytime variability or assoc excess/ purulent sputum or mucus plugs or hemoptysis or cp or chest tightness, subjective wheeze or overt  sinus or hb symptoms.   Sleeping  without nocturnal  or early am exacerbation  of respiratory  c/o's or need for noct saba. Also denies any obvious fluctuation of symptoms with weather or environmental changes or other aggravating or alleviating factors except as outlined above   No unusual exposure hx or h/o childhood pna/ asthma or knowledge of premature birth.  Current Allergies, Complete Past Medical History, Past Surgical History, Family History, and Social History were reviewed in [employer] record.  ROS  The following are not active complaints unless bolded Hoarseness, sore throat, dysphagia, dental problems, itching, sneezing,  nasal congestion or discharge of excess mucus or purulent secretions, ear ache,   fever, chills, sweats, unintended wt loss or wt gain, classically pleuritic or exertional cp,  orthopnea pnd or arm/hand swelling  or leg swelling, presyncope, palpitations, abdominal pain, anorexia, nausea, vomiting, diarrhea  or change in bowel habits or change in bladder habits, change in stools or change in urine, dysuria, hematuria,  rash, arthralgias, visual complaints, headache, numbness, weakness or ataxia or problems with walking or coordination,  change in mood or  memory.        Current Meds  Medication Sig  . albuterol (VENTOLIN HFA) 108 (90 Base) MCG/ACT inhaler INHALE USING 2 PUFFS EVERY 4 HOURS AS NEEDED ONLY IF YOUR CAN'T CATCH YOUR BREATH  . bisoprolol-hydrochlorothiazide (ZIAC) 5-6.25 MG tablet Take 1 tablet by mouth daily.  Marland Kitchen docusate sodium (STOOL SOFTENER) 100 MG capsule Per bottle as needed  . famotidine (PEPCID) 40 MG tablet Take 1 tablet (40 mg total) by mouth 2 (two) times daily.  Marland Kitchen HYDROcodone-acetaminophen (NORCO/VICODIN) 5-325 MG tablet Take 1 tablet by mouth every 8 (eight) hours as needed for moderate pain.  Marland Kitchen lansoprazole (PREVACID) 30 MG capsule Take 1 capsule (30 mg total) by mouth 2 (two) times daily before a meal.  .  levocetirizine (XYZAL) 5 MG tablet TAKE 1 TABLET BY MOUTH EVERY DAY AS NEEDED FOR DRIPPY NOSE  . OXYGEN 2lpm with exertion only  . pravastatin (PRAVACHOL) 40 MG tablet Take 1 tablet (40 mg total) by mouth at bedtime.  Marland Kitchen umeclidinium bromide (INCRUSE ELLIPTA) 62.5 MCG/INH AEPB Inhale 1 puff into the lungs daily.  Marland Kitchen  Objective:   Physical Exam  amb hoarse bm nad   04/01/2018        182  12/28/2017      176  08/27/2017        176  06/22/2017        177  05/22/2017        183  04/19/2017        179      07/31/2016        182  05/16/2016        181  04/28/2016        185  01/10/2016        179    11/19/15 182 lb (82.6 kg)  11/11/15 180 lb (81.6 kg)  11/04/15 181 lb 3.5 oz (82.2 kg)    Vital signs reviewed - Note on arrival 02 sats  90% on RA     HEENT: nl dentition / oropharynx. Nl external ear canals without cough reflex -  Mild bilateral non-specific turbinate edema     NECK :  without JVD/Nodes/TM/ nl carotid upstrokes bilaterally   LUNGS: no acc muscle use,  Mod barrel  contour chest wall with bilateral  Distant bs s audible wheeze and  without cough on insp or exp maneuver and mod  Hyperresonant  to  percussion bilaterally     CV:  RRR  no s3 or murmur or increase in P2, and no edema   ABD:  soft and nontender with pos mid insp Hoover's  in the supine position. No bruits or organomegaly appreciated, bowel sounds nl  MS:   Nl gait/  ext warm without deformities, calf tenderness, cyanosis or clubbing No obvious joint restrictions   SKIN: warm and dry without lesions    NEURO:  alert, approp, nl sensorium with  no motor or cerebellar deficits apparent.

## 2018-04-01 NOTE — Patient Instructions (Addendum)
Plan A = Automatic = Bevespi Take 2 puffs first thing in am and then another 2 puffs about 12 hours later.   Work on inhaler technique:  relax and gently blow all the way out then take a nice smooth deep breath back in, triggering the inhaler at same time you start breathing in.  Hold for up to 5 seconds if you can.   Rinse and gargle with water when done      Plan B = Backup Only use your albuterol inhaler/ventolin  as a rescue medication to be used if you can't catch your breath by resting or doing a relaxed purse lip breathing pattern.  - The less you use it, the better it will work when you need it. - Ok to use the inhaler up to 2 puffs  every 4 hours if you must but call for appointment if use goes up over your usual need - Don't leave home without it !!  (think of it like the spare tire for your car)   See Tammy NP w/in 3 months  With your drug formulary  all your medications, even over the counter meds, separated in two separate bags, the ones you take no matter what vs the ones you stop once you feel better and take only as needed when you feel you need them.   Tammy  will generate for you a new user friendly medication calendar that will put [place] all on the same page re: your medication use.     Without this process, it simply isn't possible to assure that we are providing  your outpatient care  with  the attention to detail we feel you deserve.   If we cannot assure that you're getting that kind of care,  then we cannot manage your problem effectively from this clinic.  Once you have seen Tammy and we are sure that we're all on the same page with your medication use she will arrange follow up with me.

## 2018-04-02 ENCOUNTER — Other Ambulatory Visit: Payer: Self-pay | Admitting: Family Medicine

## 2018-04-02 NOTE — Telephone Encounter (Signed)
Copied from [place] [id number]. Topic: Quick Communication - Rx Refill/Question >> Apr 02, 2018  3:49 PM Reyne Dumas L wrote: Medication: HYDROcodone-acetaminophen (NORCO/VICODIN) 5-325 MG tablet  Has the patient contacted their pharmacy? Yes - states he needs new script (Agent: If no, request that the patient contact the pharmacy for the refill.) (Agent: If yes, when and what did the pharmacy advise?)  Preferred Pharmacy (with phone number or street name): [id number] [address], [place] [phone] (Phone) [phone] (Fax)  Agent: Please be advised that RX refills may take up to 3 business days. We ask that you follow-up with your pharmacy.

## 2018-04-05 ENCOUNTER — Telehealth: Payer: Self-pay | Admitting: Family Medicine

## 2018-04-05 NOTE — Telephone Encounter (Signed)
Spoke to patient adv of cancelled appnt

## 2018-04-07 ENCOUNTER — Encounter: Payer: Self-pay | Admitting: Internal Medicine

## 2018-04-07 NOTE — Assessment & Plan Note (Signed)
Quit smoking 2013 - Spirometry  03/01/12  FEV1  1.63 - Spirometry   09/05/13 FEV1  1.34 (46%) ratio 44  - PFTs   10/29/2013    FEV1    1.70 (62%) ratio 50% no better after ssaba and DLCO 48%  - 09/05/2013  Walked RA x 3 laps @ fast pace @  185 ft each stopped due to end of study, fast pace,sat 89% at end  - 01/06/14 gradual worsening on anoro plus increase need for saba  - 01/28/2014   > try spiriva respimat/ symbicort 160 2bid > changed to symbicort 160 2bid only on 01/28/14  - 02/25/2014   90%   > changed to symbicort 80 2bid since hoarse on 160 and clear on exam  - 11/19/2015 rechallenge with symbicort 160 Take 2 puffs first thing in am and then another 2 puffs about 12 hours later.  - PFT's  01/10/2016  FEV1 1.38 (51 % ) ratio 47  p 1 % improvement from saba p symb 160  prior to study with DLCO  43/41  % corrects to 47  % for alv volume   - 02/21/2017  After extensive coaching inhaler device  effectiveness =    90% vs baseline < 25%  - 02/21/2017 changed to bevespi 2 bid  - 04/19/2017  Demonstrated elipta > try anoro to see if cost less/ works as well  - PFT's  05/22/2017  FEV1 1.21  (46 % ) ratio 46  p 1 % improvement from saba p anoro and albuterol 1 h prior to study with DLCO  38 % corrects to 49 % for alv volume   - changed to Incruse 06/04/17 due to tremors > resolved   - 04/01/2018  After extensive coaching inhaler device,  effectiveness =    75% try change to bevespi 1-2 bid (one bid if too tremulous from 2)    Pt is Group B in terms of symptom/risk and laba/lama therefore appropriate rx at this point >>>  The problem in past is that the laba in anoro caused tremulousness and can not be titrated down so bevespi may be better choice here > rec trial of 1-2 bid   Advised:  formulary restrictions will be an ongoing challenge for the forseable future and I would be happy to pick an alternative if the pt will first  provide me a list of them -  pt  will need to return here for training for any new  device that is required eg dpi vs hfa vs respimat.    In the meantime we can always provide samples so that the patient never runs out of any needed respiratory medications.

## 2018-04-07 NOTE — Assessment & Plan Note (Signed)
Dx 2017 SATURATION QUALIFICATIONS:  11/19/2015  Patient Saturations on Room Air at Rest = 96% Patient Saturations on Room Air while Ambulating = 86% Patient Saturations on 2  Liters of oxygen while Ambulating = 96% - 05/16/2016 :  Patient Saturations on Room Air at Rest = 88%--- increased to 99% on 2lpm continuous with activity     Adequate control on present rx, reviewed in detail with pt > no change in rx needed  / needs to monitor sats with exertion and adjust upwards to maintain > 90% if needed    Also needs to regroup for med reconciliation.  To keep things simple, I have asked the patient to first separate medicines that are perceived as maintenance, that is to be taken daily "no matter what", from those medicines that are taken on only on an as-needed basis and I have given the patient examples of both, and then return to see our NP to generate a  detailed  medication calendar which should be followed until the next physician sees the patient and updates it.      I had an extended discussion with the patient reviewing all relevant studies completed to date and  lasting 15 to 20 minutes of a 25 minute visit    See device teaching which extended face to face time for this visit.  Each maintenance medication was reviewed in detail including emphasizing most importantly the difference between maintenance and prns and under what circumstances the prns are to be triggered using an action plan format that is not reflected in the computer generated alphabetically organized AVS which I have not found useful in most complex patients, especially with respiratory illnesses  Please see AVS for specific instructions unique to this visit that I personally wrote and verbalized to the the pt in detail and then reviewed with pt  by my nurse highlighting any  changes in therapy recommended at today's visit to their plan of care.

## 2018-04-08 ENCOUNTER — Ambulatory Visit: Payer: Medicare Other | Admitting: Family Medicine

## 2018-04-10 DIAGNOSIS — M545 Low back pain: Secondary | ICD-10-CM | POA: Diagnosis not present

## 2018-04-10 DIAGNOSIS — Z79899 Other long term (current) drug therapy: Secondary | ICD-10-CM | POA: Diagnosis not present

## 2018-04-10 DIAGNOSIS — E559 Vitamin D deficiency, unspecified: Secondary | ICD-10-CM | POA: Diagnosis not present

## 2018-04-10 DIAGNOSIS — M25552 Pain in left hip: Secondary | ICD-10-CM | POA: Diagnosis not present

## 2018-04-10 DIAGNOSIS — M129 Arthropathy, unspecified: Secondary | ICD-10-CM | POA: Diagnosis not present

## 2018-04-10 DIAGNOSIS — G894 Chronic pain syndrome: Secondary | ICD-10-CM | POA: Diagnosis not present

## 2018-04-10 DIAGNOSIS — M25559 Pain in unspecified hip: Secondary | ICD-10-CM | POA: Diagnosis not present

## 2018-04-10 DIAGNOSIS — G8929 Other chronic pain: Secondary | ICD-10-CM | POA: Diagnosis not present

## 2018-04-14 NOTE — Telephone Encounter (Signed)
PDMP reviewed - it appears pt is now being seen at [facility] for Pain Management - hydrocodone rx from [facility] rx'd and filled on 3/4

## 2018-04-17 DIAGNOSIS — J449 Chronic obstructive pulmonary disease, unspecified: Secondary | ICD-10-CM | POA: Diagnosis not present

## 2018-04-17 DIAGNOSIS — R062 Wheezing: Secondary | ICD-10-CM | POA: Diagnosis not present

## 2018-04-24 DIAGNOSIS — Z79899 Other long term (current) drug therapy: Secondary | ICD-10-CM | POA: Diagnosis not present

## 2018-04-24 DIAGNOSIS — G894 Chronic pain syndrome: Secondary | ICD-10-CM | POA: Diagnosis not present

## 2018-04-24 DIAGNOSIS — M169 Osteoarthritis of hip, unspecified: Secondary | ICD-10-CM | POA: Diagnosis not present

## 2018-04-24 DIAGNOSIS — M5136 Other intervertebral disc degeneration, lumbar region: Secondary | ICD-10-CM | POA: Diagnosis not present

## 2018-05-02 ENCOUNTER — Telehealth: Payer: Self-pay | Admitting: Internal Medicine

## 2018-05-02 NOTE — Telephone Encounter (Signed)
Called and spoke with patient he is aware and verbalized understanding. Nothing further needed.  

## 2018-05-02 NOTE — Telephone Encounter (Signed)
He did fine with his technique last ov and if he hadn't we could/would have suggested a spacer device trial (we have them here) but they are not as convenient.

## 2018-05-02 NOTE — Telephone Encounter (Signed)
Patient would like to know if Dr.Wert would like him to use an [employer] for his inhaler.   Dr. Melvyn Novas please advise

## 2018-05-18 DIAGNOSIS — J449 Chronic obstructive pulmonary disease, unspecified: Secondary | ICD-10-CM | POA: Diagnosis not present

## 2018-05-18 DIAGNOSIS — R062 Wheezing: Secondary | ICD-10-CM | POA: Diagnosis not present

## 2018-05-22 ENCOUNTER — Telehealth: Payer: Self-pay | Admitting: Internal Medicine

## 2018-05-22 MED ORDER — PREDNISONE 10 MG PO TABS: ORAL_TABLET | ORAL | 0 refills | Status: DC

## 2018-05-22 NOTE — Telephone Encounter (Signed)
Called and spoke with pt letting him know the recs per MW and that he said for [place] to send in pred taper Rx for him to see if this would help. Pt expressed understanding. Verified pt's preferred pharmacy and sent Rx in for pt. Nothing further needed.

## 2018-05-22 NOTE — Telephone Encounter (Signed)
Med list says already using xyzal so rec  Prednisone 10 mg take  4 each am x 2 days,   2 each am x 2 days,  1 each am x 2 days and stop  Can also use nasacort AQ otc

## 2018-05-22 NOTE — Telephone Encounter (Signed)
Returned call to patient re: runny nose, watery eyes.    Primary Pulmonologist:  Wert Last office visit and with whom: 04/01/18 Wert  What do we see them for (pulmonary problems): COPD Last OV assessment/plan:      Plan A = Automatic = Bevespi Take 2 puffs first thing in am and then another 2 puffs about 12 hours later.   Work on inhaler technique:  relax and gently blow all the way out then take a nice smooth deep breath back in, triggering the inhaler at same time you start breathing in.  Hold for up to 5 seconds if you can.   Rinse and gargle with water when done    Plan B = Backup Only use your albuterol inhaler/ventolin  as a rescue medication to be used if you can't catch your breath by resting or doing a relaxed purse lip breathing pattern.  - The less you use it, the better it will work when you need it. - Ok to use the inhaler up to 2 puffs  every 4 hours if you must but call for appointment if use goes up over your usual need - Don't leave home without it !!  (think of it like the spare tire for your car)   See Tammy NP w/in 3 months  With your drug formulary  all your medications, even over the counter meds, separated in two separate bags, the ones you take no matter what vs the ones you stop once you feel better and take only as needed when you feel you need them.   Tammy  will generate for you a new user friendly medication calendar that will put [place] all on the same page re: your medication use.   Without this process, it simply isn't possible to assure that we are providing  your outpatient care  with  the attention to detail we feel you deserve.   If we cannot assure that you're getting that kind of care,  then we cannot manage your problem effectively from this clinic.  Once you have seen Tammy and we are sure that we're all on the same page with your medication use she will arrange follow up with me. (scheduled appt for 06/2018 was rescheduled for July 2020)  Was appointment  offered to patient (explain)?  Offered televisit but prefers Dr. Melvyn Novas to review info first   Reason for call:  Runny nose (clear) and sometimes nasal stuffiness, watery eyes  mildly congested cough (clear to white) No fever, no travel, no increased SOB  Not having to use albuterol  Concerned he may develop sinus infection Has not tried OTC meds   [facility] if prescriptions sent in.  He is agreeable to televisit if needed. Routed to Dr. Melvyn Novas for recommendations.  Dr. Melvyn Novas please advise.  Thank you.

## 2018-05-30 ENCOUNTER — Telehealth: Payer: Self-pay | Admitting: Internal Medicine

## 2018-05-30 MED ORDER — UMECLIDINIUM-VILANTEROL 62.5-25 MCG/INH IN AEPB: 1.0000 | INHALATION_SPRAY | Freq: Every day | RESPIRATORY_TRACT | 0 refills | Status: DC

## 2018-05-30 NOTE — Telephone Encounter (Signed)
The equivalent is anoro, not incruse, taken one click each am

## 2018-05-30 NOTE — Telephone Encounter (Signed)
Called and spoke with pt who stated he has been having headaches which he believes is coming from the [place] inhaler.  Due to this, pt is wanting to switch back to Incruse. Dr. Melvyn Novas, please advise if you are okay with pt switching back to Incruse. Thanks!

## 2018-05-30 NOTE — Telephone Encounter (Signed)
Returned call to patient.  Patient states he began having evening headaches and thinks it is due to [place]. Wants to stop Bevespi and return to incruse.  Advised patient as per Dr. Gustavus Bryant note stating the equivalent drug to [facility] would be Anoro.  Patient has taken Anoro in the past and this is the most similar medication to use if stopping the [place],. Patient acknowledged understanding and instructions for use per Dr. Gustavus Bryant note and would like to try one inhaler.  Would like Rx for Anoro called to [place].  Nothing further needed at this time.

## 2018-06-10 DIAGNOSIS — M5416 Radiculopathy, lumbar region: Secondary | ICD-10-CM | POA: Diagnosis not present

## 2018-06-13 DIAGNOSIS — M5136 Other intervertebral disc degeneration, lumbar region: Secondary | ICD-10-CM | POA: Diagnosis not present

## 2018-06-13 DIAGNOSIS — G894 Chronic pain syndrome: Secondary | ICD-10-CM | POA: Diagnosis not present

## 2018-06-13 DIAGNOSIS — Z79899 Other long term (current) drug therapy: Secondary | ICD-10-CM | POA: Diagnosis not present

## 2018-06-17 DIAGNOSIS — R062 Wheezing: Secondary | ICD-10-CM | POA: Diagnosis not present

## 2018-06-17 DIAGNOSIS — M545 Low back pain: Secondary | ICD-10-CM | POA: Diagnosis not present

## 2018-06-17 DIAGNOSIS — J449 Chronic obstructive pulmonary disease, unspecified: Secondary | ICD-10-CM | POA: Diagnosis not present

## 2018-06-23 ENCOUNTER — Other Ambulatory Visit: Payer: Self-pay | Admitting: Internal Medicine

## 2018-06-24 DIAGNOSIS — M545 Low back pain: Secondary | ICD-10-CM | POA: Diagnosis not present

## 2018-06-25 ENCOUNTER — Other Ambulatory Visit: Payer: Self-pay

## 2018-07-02 ENCOUNTER — Encounter: Payer: Medicare Other | Admitting: Adult Health

## 2018-07-08 ENCOUNTER — Other Ambulatory Visit: Payer: Self-pay | Admitting: Adult Health

## 2018-07-16 NOTE — Telephone Encounter (Signed)
error 

## 2018-08-15 ENCOUNTER — Encounter: Payer: Self-pay | Admitting: Adult Health

## 2018-08-15 ENCOUNTER — Ambulatory Visit (INDEPENDENT_AMBULATORY_CARE_PROVIDER_SITE_OTHER): Payer: Medicare Other | Admitting: Adult Health

## 2018-08-15 ENCOUNTER — Other Ambulatory Visit: Payer: Self-pay

## 2018-08-15 DIAGNOSIS — J9611 Chronic respiratory failure with hypoxia: Secondary | ICD-10-CM | POA: Diagnosis not present

## 2018-08-15 DIAGNOSIS — J449 Chronic obstructive pulmonary disease, unspecified: Secondary | ICD-10-CM | POA: Diagnosis not present

## 2018-08-15 DIAGNOSIS — C3491 Malignant neoplasm of unspecified part of right bronchus or lung: Secondary | ICD-10-CM

## 2018-08-15 NOTE — Assessment & Plan Note (Signed)
Cont on oxygen with act  POC ordered

## 2018-08-15 NOTE — Patient Instructions (Signed)
Continue on ANORO 1 puff daily , rinse after use.  Continue on Oxygen 2l/m with activity  Order for a Portable oxygen concentrator.  Activity as tolerated.  Follow up with Dr. Melvyn Novas  Or Loris Seelye NP 3-4 months and As needed   Follow up CT chest 01/2019 as planned.  Please contact office for sooner follow up if symptoms do not improve or worsen or seek emergency care

## 2018-08-15 NOTE — Assessment & Plan Note (Signed)
Cont oncology follow up with serial follow up CT chest 01/2019

## 2018-08-15 NOTE — Assessment & Plan Note (Signed)
Compensated -  Plan  Patient Instructions  Continue on ANORO 1 puff daily , rinse after use.  Continue on Oxygen 2l/m with activity  Order for a Portable oxygen concentrator.  Activity as tolerated.  Follow up with Dr. Melvyn Novas  Or Kinzlee Selvy NP 3-4 months and As needed   Follow up CT chest 01/2019 as planned.  Please contact office for sooner follow up if symptoms do not improve or worsen or seek emergency care

## 2018-08-15 NOTE — Progress Notes (Signed)
@Patient  ID: Elijah Marshall, male    DOB: 20-Jun-1950, 68 y.o.   MRN: [id number]  Chief Complaint  Patient presents with  . Follow-up    COPD     Referring provider: [name], MD  HPI: 68 year old male former smoker, quit in 2013, followed for GOLD III COPD and Oxygen dependent respiratory failure on oxygen with activity History of stage Ia non-small cell carcinoma of the right lung status post resection 2014   TEST/EVENTS :  Spirometry 03/01/12 FEV1 1.63 - Spirometry 09/05/13 FEV1 1.34 (46%) ratio 44  - PFTs 10/29/2013 FEV1 1.70 (62%) ratio 50% no better after ssaba and DLCO 48%  - PFT's 01/10/2016 FEV1 1.38 (51 % ) ratio 47 - PFT's 05/22/2017 FEV1 1.21 (46 % ) ratio 46 p 1 % improvement from saba p anoro and albuterol 1 h prior to study with DLCO 38 % corrects to 49 % for alv volume  - changed to Incruse 06/04/17 due to tremors >resolved   08/15/2018 Follow up : COPD , Lung cancer  Patient returns for a 65-month follow-up.  Patient has underlying severe COPD.  He says overall breathing is doing okay.  He gets short of breath with heavy activity.  He remains on Anoro daily. Has not been active with the COVID precautions . Has been trying to be more active in his home. Uses oxygen with activity .   Patient has a history of lung cancer stage Ia status post resection in 2014.  He had surveillance CT chest November 2019 that showed stable postsurgical changes of the right lower lobe with no evidence of recurrence or metastatic disease.  Along with moderate to severe emphysema.  He has a planned surveillance CT in November 2020.  Uses Oxygen 2l/m with activity . Wants a portable oxygen concentrator so he can be more active.    Allergies  Allergen Reactions  . Aspirin Nausea And Vomiting  . Morphine And Related Itching    Immunization History  Administered Date(s) Administered  . Influenza, High Dose Seasonal PF 11/21/2016, 11/16/2017  . Influenza,inj,Quad  PF,6+ Mos 01/15/2015  . Influenza-Unspecified 11/02/2015  . Pneumococcal Conjugate-13 04/17/2017  . Pneumococcal Polysaccharide-23 07/26/2012    Past Medical History:  Diagnosis Date  . Allergy   . Arthritis   . BPH with obstruction/lower urinary tract symptoms 2016   Nocturia  . Cancer [facility])    unsure at this time  . Cancer of lower lobe of right lung ([place])   . COPD (chronic obstructive pulmonary disease) ([place])    per patient's health survey - he put a ?  . Depression   . Erectile dysfunction due to arterial insufficiency   . GERD (gastroesophageal reflux disease)   . Hyperlipidemia   . Hypertension   . Non-small cell carcinoma of lung, right ([place]) 06/24/2015  . Shortness of breath   . Ulcer     Tobacco History: Social History   Tobacco Use  Smoking Status Former Smoker  . Packs/day: 1.00  . Years: 35.00  . Pack years: 35.00  . Types: Cigarettes  . Quit date: 09/24/2011  . Years since quitting: 6.8  Smokeless Tobacco Never Used   Counseling given: Not Answered   Outpatient Medications Prior to Visit  Medication Sig Dispense Refill  . albuterol (VENTOLIN HFA) 108 (90 Base) MCG/ACT inhaler INHALE USING 2 PUFFS EVERY 4 HOURS AS NEEDED ONLY IF YOUR CAN'T CATCH YOUR BREATH 18 Inhaler 0  . amitriptyline (ELAVIL) 25 MG tablet TAKE 1 TABLET (  25 MG TOTAL) BY MOUTH AT BEDTIME AS NEEDED FOR SLEEP. 90 tablet 0  . ANORO ELLIPTA 62.5-25 MCG/INH AEPB TAKE 1 PUFF BY MOUTH EVERY DAY 60 each 2  . bisoprolol-hydrochlorothiazide (ZIAC) 5-6.25 MG tablet Take 1 tablet by mouth daily. 90 tablet 1  . docusate sodium (STOOL SOFTENER) 100 MG capsule Per bottle as needed    . famotidine (PEPCID) 40 MG tablet Take 1 tablet (40 mg total) by mouth 2 (two) times daily. 180 tablet 3  . HYDROcodone-acetaminophen (NORCO/VICODIN) 5-325 MG tablet Take 1 tablet by mouth every 8 (eight) hours as needed for moderate pain. 90 tablet 0  . lansoprazole (PREVACID) 30 MG capsule TAKE 1 CAPSULE (30 MG TOTAL)  BY MOUTH 2 (TWO) TIMES DAILY. 180 capsule 1  . levocetirizine (XYZAL) 5 MG tablet TAKE 1 TABLET BY MOUTH EVERY DAY AS NEEDED FOR DRIPPY NOSE 90 tablet 1  . OXYGEN 2lpm with exertion only    . pravastatin (PRAVACHOL) 40 MG tablet Take 1 tablet (40 mg total) by mouth at bedtime. 90 tablet 3  . Glycopyrrolate-Formoterol (BEVESPI AEROSPHERE) 9-4.8 MCG/ACT AERO Inhale 2 puffs into the lungs 2 (two) times daily. 1 Inhaler 11  . predniSONE (DELTASONE) 10 MG tablet Take 4tabsx2days, 2tabsx2days, 1tabx2days, then stop 14 tablet 0   No facility-administered medications prior to visit.      Review of Systems:   Constitutional:   No  weight loss, night sweats,  Fevers, chills,  +fatigue, or  lassitude.  HEENT:   No headaches,  Difficulty swallowing,  Tooth/dental problems, or  Sore throat,                No sneezing, itching, ear ache, nasal congestion, post nasal drip,   CV:  No chest pain,  Orthopnea, PND, swelling in lower extremities, anasarca, dizziness, palpitations, syncope.   GI  No heartburn, indigestion, abdominal pain, nausea, vomiting, diarrhea, change in bowel habits, loss of appetite, bloody stools.   Resp:    No chest wall deformity  Skin: no rash or lesions.  GU: no dysuria, change in color of urine, no urgency or frequency.  No flank pain, no hematuria   MS:  No joint pain or swelling.  No decreased range of motion.  No back pain.    Physical Exam  BP (!) 156/98 (BP Location: Left Arm, Cuff Size: Normal)   Pulse 80   Temp 98.2 F (36.8 C) (Oral)   Ht 5\' 9"  (1.753 m)   Wt 182 lb 6.4 oz (82.7 kg)   SpO2 92%   BMI 26.94 kg/m   GEN: A/Ox3; pleasant , NAD, elderly    HEENT:  [place]/AT,  EACs-clear, TMs-wnl, NOSE-clear, THROAT-clear, no lesions, no postnasal drip or exudate noted.   NECK:  Supple w/ fair ROM; no JVD; normal carotid impulses w/o bruits; no thyromegaly or nodules palpated; no lymphadenopathy.    RESP  Decreased BS in bases   no accessory muscle use, no  dullness to percussion  CARD:  RRR, no m/r/g, no peripheral edema, pulses intact, no cyanosis or clubbing.  GI:   Soft & nt; nml bowel sounds; no organomegaly or masses detected.   Musco: Warm bil, no deformities or joint swelling noted.   Neuro: alert, no focal deficits noted.    Skin: Warm, no lesions or rashes    Lab Results:  CBC   BNP No results found for: BNP  ProBNP No results found for: PROBNP  Imaging: No results found.    PFT Results  Latest Ref Rng & Units 05/22/2017 01/10/2016 10/29/2013  FVC-Pre L 2.57 2.90 3.43  FVC-Predicted Pre % 74 83 97  FVC-Post L 2.66 2.91 3.39  FVC-Predicted Post % 76 83 96  Pre FEV1/FVC % % 46 47 50  Post FEV1/FCV % % 46 47 51  FEV1-Pre L 1.19 1.36 1.70  FEV1-Predicted Pre % 45 51 62  FEV1-Post L 1.21 1.38 1.71  DLCO UNC% % 38 43 48  DLCO COR %Predicted % 49 47 53  TLC L 6.83 6.87 5.25  TLC % Predicted % 106 107 82  RV % Predicted % 162 152 62    No results found for: NITRICOXIDE      Assessment & Plan:   No problem-specific Assessment & Plan notes found for this encounter.     Rexene Edison, NP 08/15/2018

## 2018-08-21 ENCOUNTER — Other Ambulatory Visit: Payer: Self-pay | Admitting: Emergency Medicine

## 2018-09-09 ENCOUNTER — Telehealth: Payer: Self-pay | Admitting: Internal Medicine

## 2018-09-09 NOTE — Telephone Encounter (Signed)
Returned call to patient.  Complaint of mild sore throat. Denies any fever, chills, body aches or other symptoms.  Has used saline nasal rinse recently and used albuterol inhaler "a couple of times' and wonders if this could have irritated throat.  Advised to monitor symptoms and if throat worsens or other symptoms develop, follow up with primary care for sore throat or here is respiratory symptoms develop or worsen.  Patient acknowledged understanding. Nothing further needed.

## 2018-09-20 ENCOUNTER — Other Ambulatory Visit: Payer: Self-pay | Admitting: Internal Medicine

## 2018-10-14 ENCOUNTER — Other Ambulatory Visit: Payer: Self-pay | Admitting: Adult Health

## 2018-11-20 ENCOUNTER — Telehealth: Payer: Self-pay | Admitting: Internal Medicine

## 2018-11-20 NOTE — Telephone Encounter (Signed)
Scheduled appt per 10/14 sch message- pt aware of appt date and time

## 2018-11-22 ENCOUNTER — Other Ambulatory Visit: Payer: Self-pay | Admitting: Internal Medicine

## 2018-12-17 ENCOUNTER — Encounter: Payer: Self-pay | Admitting: Internal Medicine

## 2018-12-17 ENCOUNTER — Other Ambulatory Visit: Payer: Self-pay

## 2018-12-17 ENCOUNTER — Ambulatory Visit: Payer: Medicare Other | Admitting: Internal Medicine

## 2018-12-17 DIAGNOSIS — J9611 Chronic respiratory failure with hypoxia: Secondary | ICD-10-CM | POA: Diagnosis not present

## 2018-12-17 DIAGNOSIS — J449 Chronic obstructive pulmonary disease, unspecified: Secondary | ICD-10-CM

## 2018-12-17 MED ORDER — BEVESPI AEROSPHERE 9-4.8 MCG/ACT IN AERO: 2.0000 | INHALATION_SPRAY | Freq: Two times a day (BID) | RESPIRATORY_TRACT | 0 refills | Status: DC

## 2018-12-17 NOTE — Patient Instructions (Addendum)
Plan A = Automatic = Always=   Either Anoro or  Bevespi Take 2 puffs first thing in am and then another 2 puffs about 12 hours later.   Work on inhaler technique:  relax and gently blow all the way out then take a nice smooth deep breath back in, triggering the inhaler at same time you start breathing in.  Hold for up to 5 seconds if you can.   Rinse and gargle with water when done.  Use arm and hammer toothpaste after using inhaler      Plan B = Backup (to supplement plan A, not to replace it) Only use your albuterol (ventolin)  inhaler as a rescue medication to be used if you can't catch your breath by resting or doing a relaxed purse lip breathing pattern.  - The less you use it, the better it will work when you need it. - Ok to use the inhaler up to 2 puffs  every 4 hours if you must but call for appointment if use goes up over your usual need - Don't leave home without it !!  (think of it like the spare tire for your car)    Please schedule a follow up office visit in 6 months, call sooner if needed    Reminded: Make sure you check your oxygen saturations at highest level of activity to be sure it stays over 90% and adjust upward to maintain this level if needed but remember to turn it back to previous settings when you stop (to conserve your supply).

## 2018-12-17 NOTE — Progress Notes (Signed)
Subjective:   Patient ID: Elijah Marshall, male    DOB: 1950-09-12  MRN: [id number]   Brief patient profile:  68  yobm quit smoking 2013  and able work out at [employer] and did fine until hurt back 2011 then 2 back surgeries and knee surgery  and more noticeable doe  since then but also limited by back and s/p sup segmentectomy RLL NSC lung ca Ia 05/2015  referred to pulmonary clinic 09/05/2013 for ? Copd  With GOLD II criteria 10/29/13 and confirmed 01/10/2016     History of Present Illness  09/05/2013 1st [facility] office visit/ [facility]  Chief Complaint  Patient presents with  . Pulmonary Consult    Referred per Dr. Norberto Sorenson.  Pt c/o SOB for the past 3 yrs. He states that he was dxed with COPD back in 2012 or 2013.  He states that that he sometimes has trouble with breathing when walking up stairs and lifting things.    rx spiriva first, alb, dulera not as effective  No problem at rest or supine Assoc nasal congestion much better p shot (?depomedrol) and bad again x sev years corresponds to worse doe  Ex = walking one mile slower pace than wife,trouble with hills, worse in heat  Bad hb even on dexilant  Min dry cough  rec Stop spiriva  Start anoro two puffs off one click each am Zantac 150 mg one at  Bedtime Prednisone 10 mg take  4 each am x 2 days,   2 each am x 2 days,  1 each am x 2 days and stop  GERD diet    04/19/2017  f/u ov/Norrine Ballester re:  GOLD II copd /  Chief Complaint  Patient presents with  . Follow-up    Patient was told to follow up with formulary in hand to discuss medications.   Dyspnea:   improved with walking back up  to 30 min on 2lpm  Cough: none Sleep: no resp problems SABA use:  No change on bevespi vs symbicort / concerned with cost issues rec Plan A = Automatic = Bevespi 2 every 12 hours or ANORO one click each am  But pick the one that works the best for you and if they are both the same then chose the less costly  Plan B = Backup Only use your albuterol  as a rescue medication   Could not tol anoro due to tremors > resolved on Incruse        12/28/2017  f/u ov/Hoyt Leanos re:  GOLD III/ incruse maint and 02 on 2lpm ex / no med calendar  Chief Complaint  Patient presents with  . Follow-up    No more hemoptysis since last visit. He is using his albuterol inhaler 3 x per wk on average.   Dyspnea:  Baseline = MMRC2 = can't walk a nl pace on a flat grade s sob but does fine slow and flat on 2lpm  Cough: improved/ min mucoid no disturbing sleep  Sleeping: flat SABA use: minimal use but could not tell med when to start or how often he can use saba if worse (instructions are on action plan of med calendar he forgot to bring)  rec Please schedule a follow up visit in 3 months but call sooner if needed  - bring your medication calendar and drug formulary so we can see if there is a cheaper alternative under your present drug plan  -add: advise that p review of ct  chest he needs to call for any recurrent hemoptysis to arrange f/u    03/01/18 NP eval Doxycycline 100mg  Twice daily  For 1 week , take with food.  Mucinex DM Twice daily  As needed  Cough/congestion  Saline nasal rinses As needed   Salt water gargles As needed   Xyzal daily for 1 week then As needed   Continue on INCRUSE 1 puff daily , rinse after use.  Follow up with Dr. Sherene Sires  As planned and As needed      04/01/2018  f/u ov/Mazie Fencl re: GOLD III/ maint incruse as cannot tol laba/ 02 2lpm with ex / no med calendar  Chief Complaint  Patient presents with  . Follow-up    Breathing is about the same. He is using his rescue inhaler about 3 x per wk.   Dyspnea:  MMRC2 = can't walk a nl pace on a flat grade s sob but does fine slow and flat  Cough: occ Sleeping: on side/ bed is flat/ 2 pillows SABA use: as above 02: 2lpm prn  rec Plan A = Automatic = Bevespi Take 2 puffs first thing in am and then another 2 puffs about 12 hours later.  Work on inhaler technique:    Plan B = Backup Only  use your albuterol inhaler/ventolin  as a rescue medication See Tammy NP w/in 3 months  With your drug formulary  all your medication   12/17/2018  f/u ov/Miasha Emmons re: GOLD III maint on anoro but having more "throat congestion" Chief Complaint  Patient presents with  . Follow-up    Pt states feels like sputum in the throat, not coming up. He has also noticed some wheezing. He is using his albuterol inhaler 2-3 x per wk on average.   Dyspnea:  Uses [employer] parking, uses [employer] and able to do an aisle or two then stops not using  02 or monitoring sats  Cough: feels like something stuck in throat Sleeping: on side/ bed is flat / 2 pillows SABA use:  Few times a week 02: prn 2 lpm portable pulse not typically using  Elavil hs also x years denies mouth dry     No obvious day to day or daytime variability or assoc excess/ purulent sputum or mucus plugs or hemoptysis or cp or chest tightness, subjective wheeze or overt sinus or hb symptoms.   Sleeping as above  without nocturnal  or early am exacerbation  of respiratory  c/o's or need for noct saba. Also denies any obvious fluctuation of symptoms with weather or environmental changes or other aggravating or alleviating factors except as outlined above   No unusual exposure hx or h/o childhood pna/ asthma or knowledge of premature birth.  Current Allergies, Complete Past Medical History, Past Surgical History, Family History, and Social History were reviewed in [employer] record.  ROS  The following are not active complaints unless bolded Hoarseness, sore throat, dysphagia, dental problems, itching, sneezing,  nasal congestion or discharge of excess mucus or purulent secretions, ear ache,   fever, chills, sweats, unintended wt loss or wt gain, classically pleuritic or exertional cp,  orthopnea pnd or arm/hand swelling  or leg swelling, presyncope, palpitations, abdominal pain, anorexia, nausea, vomiting, diarrhea  or change in  bowel habits or change in bladder habits, change in stools or change in urine, dysuria, hematuria,  rash, arthralgias, visual complaints, headache, numbness, weakness or ataxia or problems with walking or coordination,  change in mood or  memory.  Current Meds  Medication Sig  . albuterol (VENTOLIN HFA) 108 (90 Base) MCG/ACT inhaler INHALE USING 2 PUFFS EVERY 4 HOURS AS NEEDED ONLY IF YOUR CAN'T CATCH YOUR BREATH  . amitriptyline (ELAVIL) 25 MG tablet TAKE 1 TABLET (25 MG TOTAL) BY MOUTH AT BEDTIME AS NEEDED FOR SLEEP.  Marland Kitchen ANORO ELLIPTA 62.5-25 MCG/INH AEPB INHALE 1 PUFF BY MOUTH EVERY DAY  . bisoprolol-hydrochlorothiazide (ZIAC) 5-6.25 MG tablet Take 1 tablet by mouth daily.  Marland Kitchen docusate sodium (STOOL SOFTENER) 100 MG capsule Per bottle as needed  . famotidine (PEPCID) 40 MG tablet Take 1 tablet (40 mg total) by mouth 2 (two) times daily.  Marland Kitchen HYDROcodone-acetaminophen (NORCO/VICODIN) 5-325 MG tablet Take 1 tablet by mouth every 8 (eight) hours as needed for moderate pain.  Marland Kitchen lansoprazole (PREVACID) 30 MG capsule TAKE 1 CAPSULE (30 MG TOTAL) BY MOUTH 2 (TWO) TIMES DAILY.  Marland Kitchen levocetirizine (XYZAL) 5 MG tablet TAKE 1 TABLET BY MOUTH EVERY DAY AS NEEDED FOR DRIPPY NOSE  . OXYGEN 2lpm with exertion only  . pravastatin (PRAVACHOL) 40 MG tablet Take 1 tablet (40 mg total) by mouth at bedtime.                 Objective:   Physical Exam    12/17/2018      182  04/01/2018        182  12/28/2017      176  08/27/2017        176  06/22/2017        177  05/22/2017        183  04/19/2017        179      07/31/2016        182  05/16/2016        181  04/28/2016        185  01/10/2016        179    11/19/15 182 lb (82.6 kg)  11/11/15 180 lb (81.6 kg)  11/04/15 181 lb 3.5 oz (82.2 kg)     slt hoarse pleasant amb wm nad  BP 128/84 (BP Location: Left Arm, Cuff Size: Normal)   Pulse 78   Temp (!) 97.3 F (36.3 C) (Temporal)   Ht 5\' 9"  (1.753 m)   Wt 182 lb (82.6 kg)   SpO2 100% Comment: on  RA  BMI 26.88 kg/m        HEENT : pt wearing mask not removed for exam due to covid -19 concerns.    NECK :  without JVD/Nodes/TM/ nl carotid upstrokes bilaterally   LUNGS: no acc muscle use,  Mod barrel  contour chest wall with bilateral  Distant bs s audible wheeze and  without cough on insp or exp maneuvers and mod  Hyperresonant  to  percussion bilaterally     CV:  RRR  no s3 or murmur or increase in P2, and no edema   ABD:  soft and nontender with pos mid insp Hoover's  in the supine position. No bruits or organomegaly appreciated, bowel sounds nl  MS:     ext warm without deformities, calf tenderness, cyanosis or clubbing No obvious joint restrictions   SKIN: warm and dry without lesions    NEURO:  alert, approp, nl sensorium with  no motor or cerebellar deficits apparent.        CT chest due 12/20/2018 so no cxr ordered

## 2018-12-18 ENCOUNTER — Encounter: Payer: Self-pay | Admitting: Internal Medicine

## 2018-12-18 NOTE — Assessment & Plan Note (Addendum)
Dx 2017 SATURATION QUALIFICATIONS:  11/19/2015  Patient Saturations on Room Air at Rest = 96% Patient Saturations on Room Air while Ambulating = 86% Patient Saturations on 2  Liters of oxygen while Ambulating = 96% - 05/16/2016 :  Patient Saturations on Room Air at Rest = 88%--- increased to 99% on 2lpm continuous with activity    sats 100% RA at rest 12/17/2018 so no need at rest and also unlikely to need around the house  Rec: Make sure you check your oxygen saturations at highest level of activity to be sure it stays over 90% and adjust upward to maintain this level if needed but remember to turn it back to previous settings when you stop (to conserve your supply).    I had an extended discussion with the patient reviewing all relevant studies completed to date and  lasting 15 to 20 minutes of a 25 minute visit    I performed detailed device teaching using a teach back method which extended face to face time for this visit (see above)  Each maintenance medication was reviewed in detail including emphasizing most importantly the difference between maintenance and prns and under what circumstances the prns are to be triggered using an action plan format that is not reflected in the computer generated alphabetically organized AVS which I have not found useful in most complex patients, especially with respiratory illnesses  Please see AVS for specific instructions unique to this visit that I personally wrote and verbalized to the the pt in detail and then reviewed with pt  by my nurse highlighting any  changes in therapy recommended at today's visit to their plan of care.

## 2018-12-18 NOTE — Assessment & Plan Note (Signed)
Quit smoking 2013 - Spirometry  03/01/12  FEV1  1.63 - Spirometry   09/05/13 FEV1  1.34 (46%) ratio 44  - PFTs   10/29/2013    FEV1    1.70 (62%) ratio 50% no better after ssaba and DLCO 48%  - 09/05/2013  Walked RA x 3 laps @ fast pace @  185 ft each stopped due to end of study, fast pace,sat 89% at end  - 01/06/14 gradual worsening on anoro plus increase need for saba  - 01/28/2014   > try spiriva respimat/ symbicort 160 2bid > changed to symbicort 160 2bid only on 01/28/14  - 02/25/2014   90%   > changed to symbicort 80 2bid since hoarse on 160 and clear on exam  - 11/19/2015 rechallenge with symbicort 160 Take 2 puffs first thing in am and then another 2 puffs about 12 hours later.  - PFT's  01/10/2016  FEV1 1.38 (51 % ) ratio 47  p 1 % improvement from saba p symb 160  prior to study with DLCO  43/41  % corrects to 47  % for alv volume   - 02/21/2017  After extensive coaching inhaler device  effectiveness =    90% vs baseline < 25%  - 02/21/2017 changed to bevespi 2 bid  - 04/19/2017  Demonstrated elipta > try anoro to see if cost less/ works as well  - PFT's  05/22/2017  FEV1 1.21  (46 % ) ratio 46  p 1 % improvement from saba p anoro and albuterol 1 h prior to study with DLCO  38 % corrects to 49 % for alv volume   - changed to Incruse 06/04/17 due to tremors > resolved  - 04/01/2018    try change to bevespi 1-2 bid (one bid if too tremulous from 2)  - 10/21/2018 changed to anoro > globus sensation reported 12/17/2018  -12/17/2018  After extensive coaching inhaler device,  effectiveness =    75% rec try back on hfa = bevespi to see if throat symptoms improve   Other option = stiolto depending on insurance but would need to return here for device training.   Advised:  formulary restrictions will be an ongoing challenge for the forseable future and I would be happy to pick an alternative if the pt will first  provide me a list of them -  pt  will need to return here for training for any new device that  is required eg dpi vs hfa vs respimat.    In the meantime we can always provide samples so that the patient never runs out of any needed respiratory medications.

## 2018-12-19 ENCOUNTER — Telehealth: Payer: Self-pay | Admitting: *Deleted

## 2018-12-19 NOTE — Telephone Encounter (Signed)
LMTCB

## 2018-12-19 NOTE — Telephone Encounter (Signed)
-----   Message from [name], MD sent at 12/18/2018  5:19 AM EST ----- I had meant to walk him so make sure he checks his 02 sats at peak ex and uses 02 portable to keep over 90% if it's dropping

## 2018-12-20 ENCOUNTER — Ambulatory Visit (HOSPITAL_COMMUNITY)
Admission: RE | Admit: 2018-12-20 | Discharge: 2018-12-20 | Disposition: A | Discharge status: Home / Self Care | Payer: Medicare Other | Source: Ambulatory Visit | Attending: Internal Medicine | Admitting: Internal Medicine

## 2018-12-20 ENCOUNTER — Inpatient Hospital Stay: Payer: Medicare Other | Attending: Internal Medicine

## 2018-12-20 ENCOUNTER — Other Ambulatory Visit: Payer: Self-pay | Admitting: Internal Medicine

## 2018-12-20 ENCOUNTER — Other Ambulatory Visit: Payer: Self-pay

## 2018-12-20 DIAGNOSIS — C3490 Malignant neoplasm of unspecified part of unspecified bronchus or lung: Secondary | ICD-10-CM

## 2018-12-20 DIAGNOSIS — Z79899 Other long term (current) drug therapy: Secondary | ICD-10-CM | POA: Diagnosis not present

## 2018-12-20 DIAGNOSIS — C3431 Malignant neoplasm of lower lobe, right bronchus or lung: Secondary | ICD-10-CM | POA: Diagnosis present

## 2018-12-20 DIAGNOSIS — J449 Chronic obstructive pulmonary disease, unspecified: Secondary | ICD-10-CM | POA: Diagnosis not present

## 2018-12-20 DIAGNOSIS — I10 Essential (primary) hypertension: Secondary | ICD-10-CM | POA: Diagnosis not present

## 2018-12-20 LAB — CBC WITH DIFFERENTIAL (CANCER CENTER ONLY)
Abs Immature Granulocytes: 0.01 10*3/uL (ref 0.00–0.07)
Basophils Absolute: 0 10*3/uL (ref 0.0–0.1)
Basophils Relative: 1 %
Eosinophils Absolute: 0.2 10*3/uL (ref 0.0–0.5)
Eosinophils Relative: 3 %
HCT: 50.7 % (ref 39.0–52.0)
Hemoglobin: 16.9 g/dL (ref 13.0–17.0)
Immature Granulocytes: 0 %
Lymphocytes Relative: 37 %
Lymphs Abs: 2 10*3/uL (ref 0.7–4.0)
MCH: 29.1 pg (ref 26.0–34.0)
MCHC: 33.3 g/dL (ref 30.0–36.0)
MCV: 87.4 fL (ref 80.0–100.0)
Monocytes Absolute: 0.7 10*3/uL (ref 0.1–1.0)
Monocytes Relative: 14 %
Neutro Abs: 2.4 10*3/uL (ref 1.7–7.7)
Neutrophils Relative %: 45 %
Platelet Count: 210 10*3/uL (ref 150–400)
RBC: 5.8 MIL/uL (ref 4.22–5.81)
RDW: 13.2 % (ref 11.5–15.5)
WBC Count: 5.4 10*3/uL (ref 4.0–10.5)
nRBC: 0 % (ref 0.0–0.2)

## 2018-12-20 LAB — CMP (CANCER CENTER ONLY)
ALT: 15 U/L (ref 0–44)
AST: 23 U/L (ref 15–41)
Albumin: 4.3 g/dL (ref 3.5–5.0)
Alkaline Phosphatase: 94 U/L (ref 38–126)
Anion gap: 9 (ref 5–15)
BUN: 7 mg/dL — ABNORMAL LOW (ref 8–23)
CO2: 30 mmol/L (ref 22–32)
Calcium: 9.3 mg/dL (ref 8.9–10.3)
Chloride: 101 mmol/L (ref 98–111)
Creatinine: 1.12 mg/dL (ref 0.61–1.24)
GFR, Est AFR Am: >60 mL/min (ref >60)
GFR, Estimated: >60 mL/min (ref >60)
Glucose, Bld: 90 mg/dL (ref 70–99)
Potassium: 3.6 mmol/L (ref 3.5–5.1)
Sodium: 140 mmol/L (ref 135–145)
Total Bilirubin: 0.7 mg/dL (ref 0.3–1.2)
Total Protein: 7.4 g/dL (ref 6.5–8.1)

## 2018-12-20 MED ORDER — IOHEXOL 300 MG/ML  SOLN
75.0000 mL | Freq: Once | INTRAMUSCULAR | Status: AC | PRN
  Administered 2018-12-20: 75 mL via INTRAVENOUS

## 2018-12-20 MED ORDER — SODIUM CHLORIDE (PF) 0.9 % IJ SOLN
INTRAMUSCULAR | Status: AC
  Filled 2018-12-20: qty 50

## 2018-12-21 IMAGING — CT CT HEAD W/O CM
3 series · 15 of 47 positions shown, 18 images · non-contrast
Comparison: CT HEAD December 06, 2013

CLINICAL DATA: Involuntary muscle twitching. Neck pain radiating to
upper extremities. History of lung cancer, hypertension,
hyperlipidemia.

EXAM:
CT HEAD WITHOUT CONTRAST
TECHNIQUE: Contiguous axial images were obtained from the base of the skull
through the vertex without intravenous contrast.

[Series 4: head wo · axial · 0.47mm/px · z∈[+1806,+1931]mm · 9 of 31 slices shown, 12 images]
[im 3/31  brain]
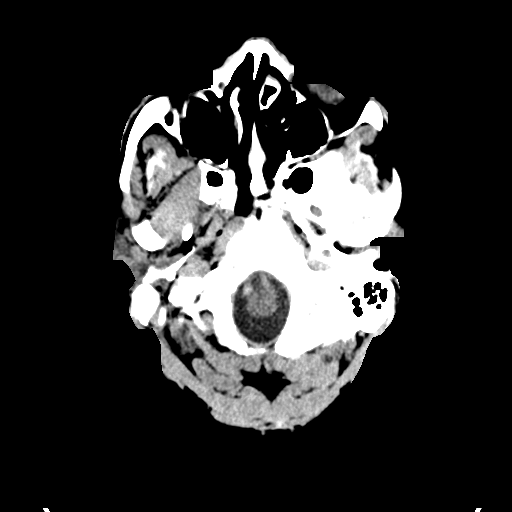
[im 3/31  bone]
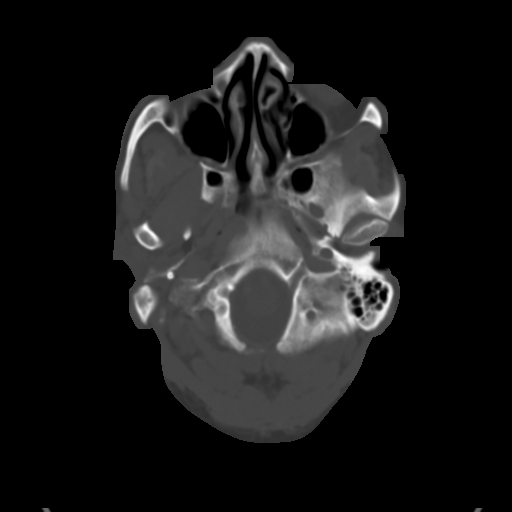
[im 6/31  brain]
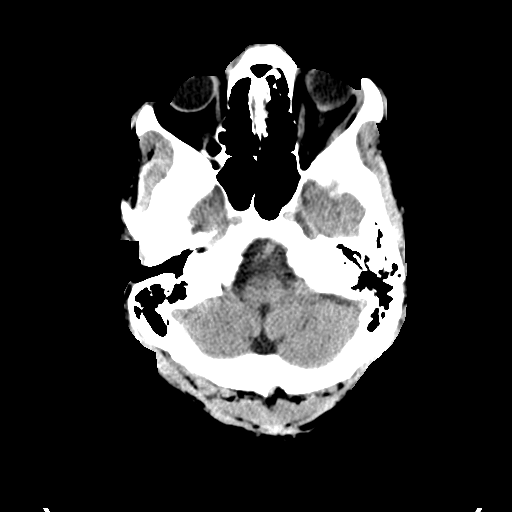
[im 9/31  brain]
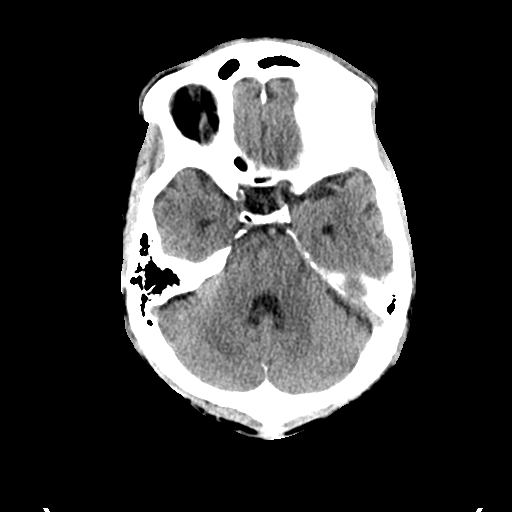
[im 12/31  brain]
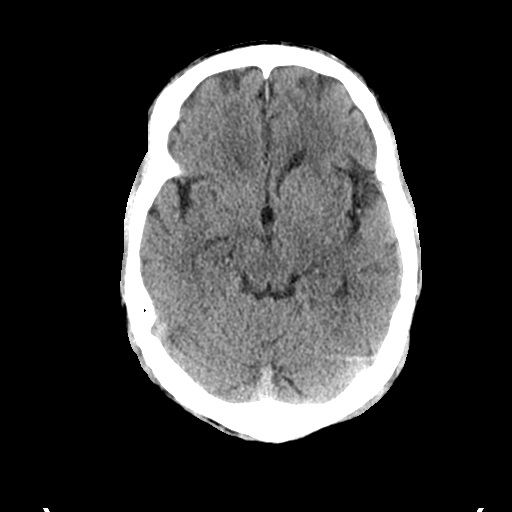
[im 16/31  brain]
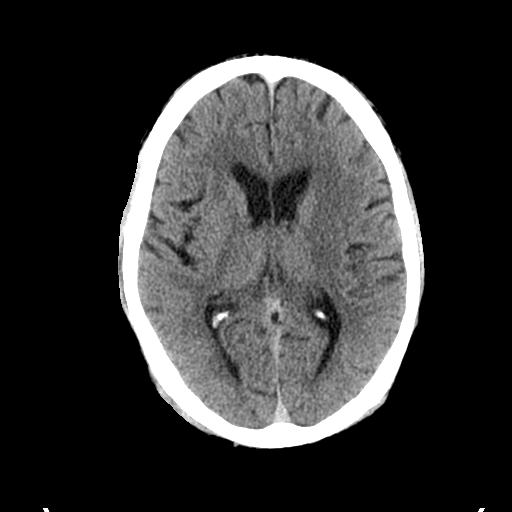
[im 16/31  bone]
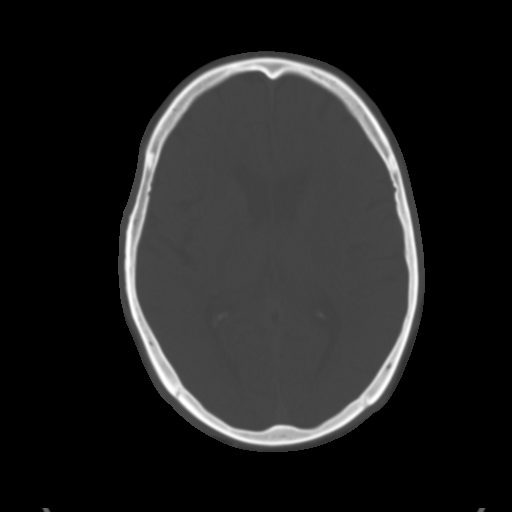
[im 19/31  brain]
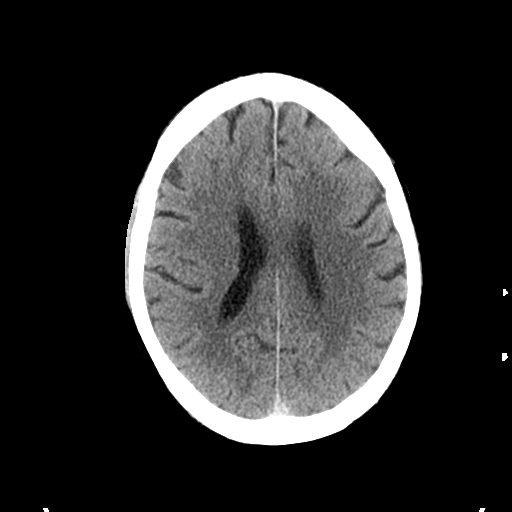
[im 22/31  brain]
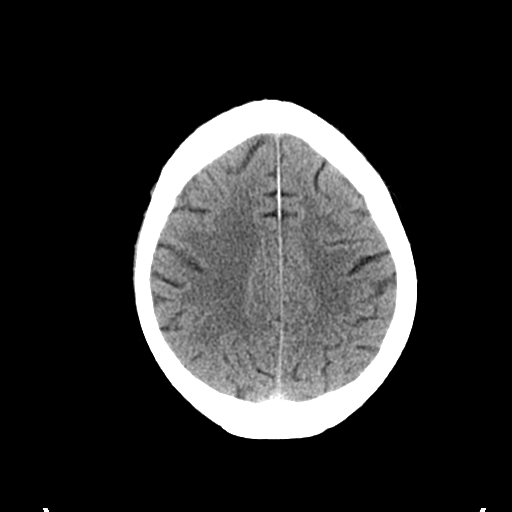
[im 25/31  brain]
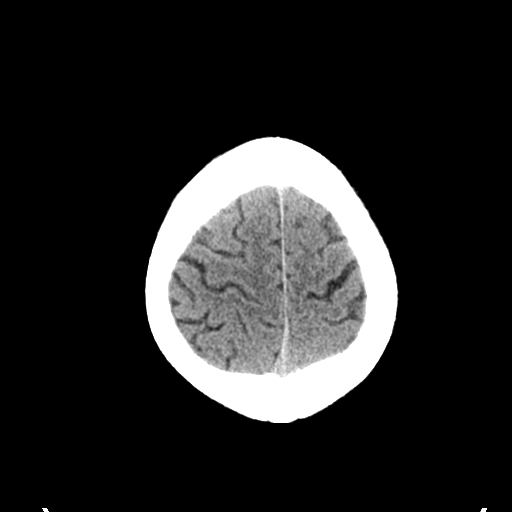
[im 28/31  brain]
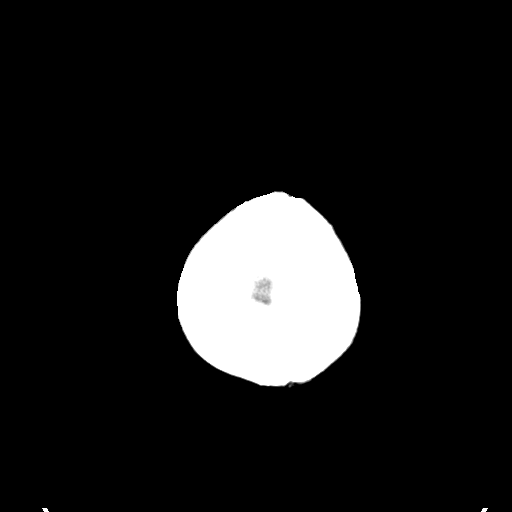
[im 28/31  bone]
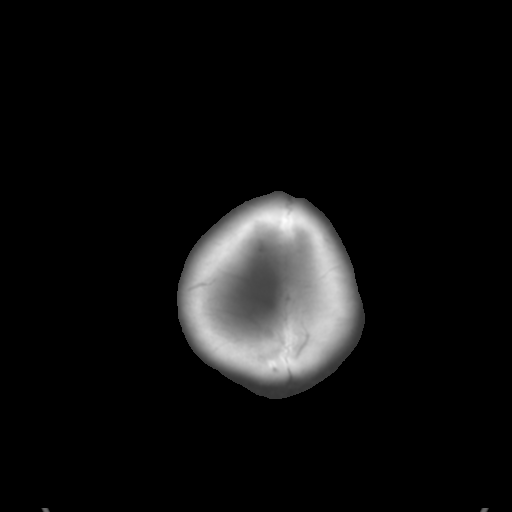

[Series 5: coronal soft tissue · coronal · 0.30mm/px · 3 of 69 slices shown]
[im 23/69  brain]
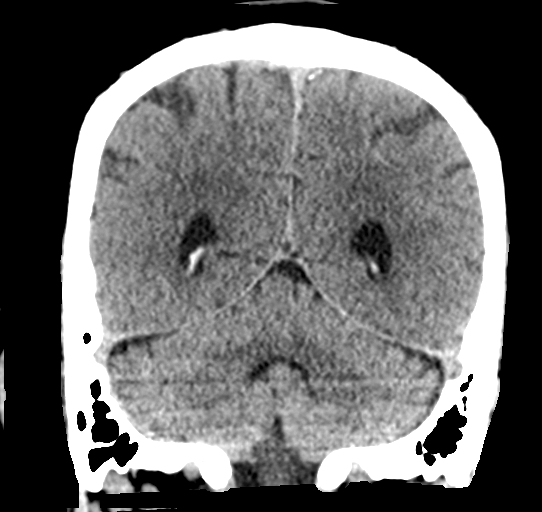
[im 31/69  brain]
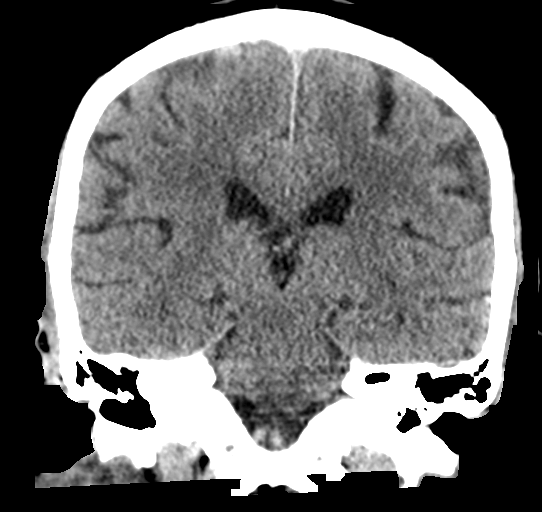
[im 38/69  brain]
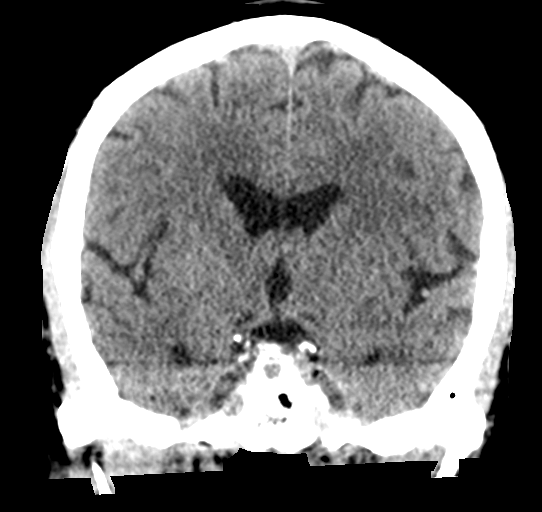

[Series 6: sagittal soft tissue · sagittal · 0.30mm/px · 3 of 55 slices shown]
[im 19/55  brain]
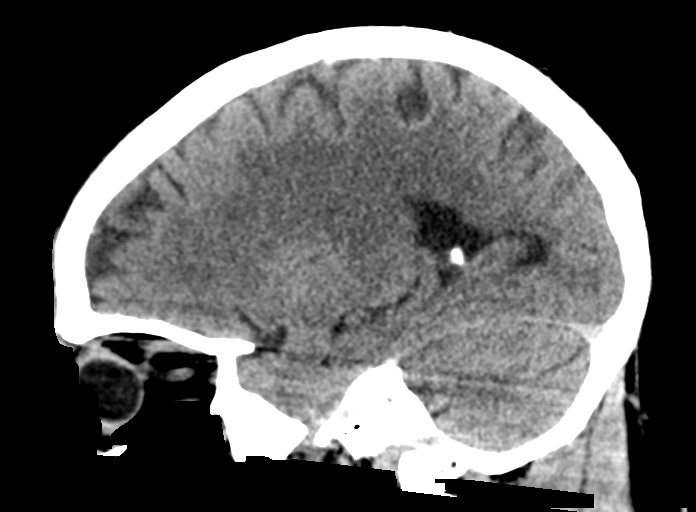
[im 28/55  brain]
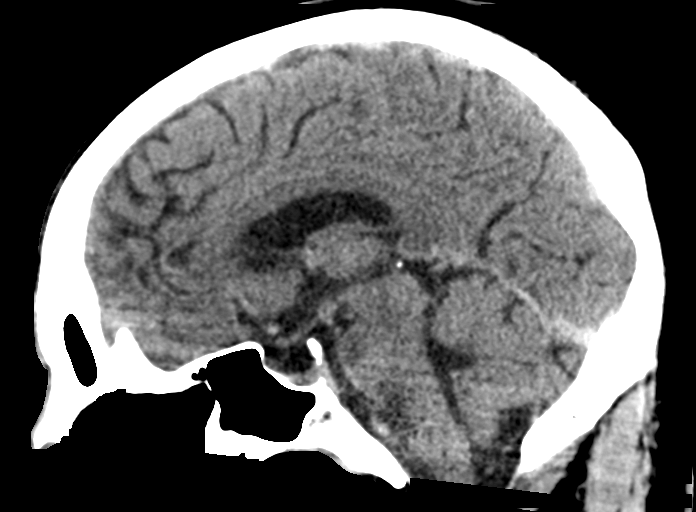
[im 37/55  brain]
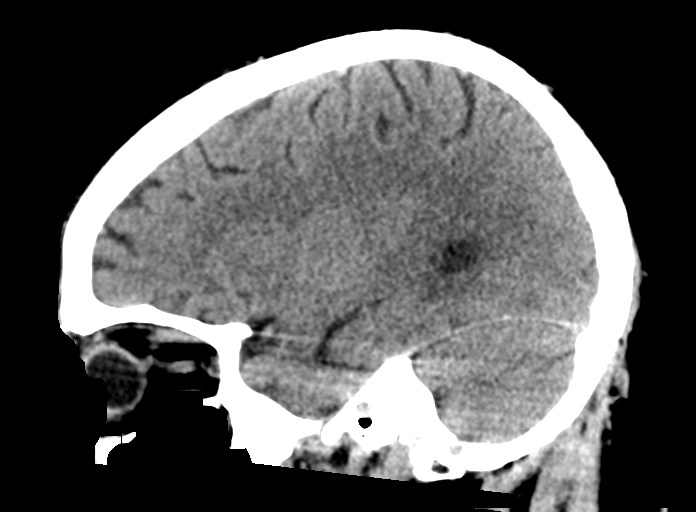

[15 of 47 positions shown; findings below may reference images not displayed]

FINDINGS: BRAIN: No intraparenchymal hemorrhage, mass effect nor midline
shift. Mild parenchymal brain volume loss progressed from prior CT.
No acute large vascular territory infarcts. No abnormal extra-axial
fluid collections. Basal cisterns are patent.

VASCULAR: Mild calcific atherosclerosis of the carotid siphons.

SKULL: No skull fracture. Partially empty sella. No significant
scalp soft tissue swelling.

SINUSES/ORBITS: The mastoid air-cells and included paranasal sinuses
are well-aerated.The included ocular globes and orbital contents are
non-suspicious.

OTHER: None.
IMPRESSION: 1. No acute intracranial process.
2. Mild parenchymal brain volume loss progressed from 3441.

## 2018-12-23 NOTE — Telephone Encounter (Signed)
LMTCB

## 2018-12-24 NOTE — Telephone Encounter (Signed)
Spoke with the pt and notified of recs per Dr Melvyn Novas and he verbalized understanding

## 2018-12-25 ENCOUNTER — Inpatient Hospital Stay: Payer: Medicare Other | Admitting: Internal Medicine

## 2018-12-25 ENCOUNTER — Other Ambulatory Visit: Payer: Self-pay

## 2018-12-25 ENCOUNTER — Encounter: Payer: Self-pay | Admitting: Internal Medicine

## 2018-12-25 DIAGNOSIS — C3491 Malignant neoplasm of unspecified part of right bronchus or lung: Secondary | ICD-10-CM

## 2018-12-25 DIAGNOSIS — C3431 Malignant neoplasm of lower lobe, right bronchus or lung: Secondary | ICD-10-CM | POA: Diagnosis not present

## 2018-12-25 DIAGNOSIS — C3490 Malignant neoplasm of unspecified part of unspecified bronchus or lung: Secondary | ICD-10-CM

## 2018-12-25 NOTE — Progress Notes (Signed)
[facility] [phone]   [phone]  OFFICE PROGRESS NOTE  [name], MD [place] [address]  DIAGNOSIS: Stage IA (T1a, N0, M0) non-small cell lung cancer, moderately differentiated squamous cell carcinoma presented with right lower lobe lung nodule.   PRIOR THERAPY: Status post wedge resection of the right lower lobe in April 2017 under the care of Dr. Roxan Hockey.  CURRENT THERAPY: Observation.  INTERVAL HISTORY: Elijah Marshall 68 y.o. male returns to the clinic today for follow-up visit.  The patient is feeling fine today with no concerning complaints.  He has occasional shortness of breath with exertion but no significant chest pain, cough or hemoptysis.  He denied having any fever or chills.  He has no nausea, vomiting, diarrhea or constipation.  He denied having any headache or visual changes.  He has no recent weight loss or night sweats.  The patient had repeat CT scan of the chest performed recently and he is here for evaluation and discussion of his discuss results.  MEDICAL HISTORY: Past Medical History:  Diagnosis Date  . Allergy   . Arthritis   . BPH with obstruction/lower urinary tract symptoms 2016   Nocturia  . Cancer [facility])    unsure at this time  . Cancer of lower lobe of right lung ([facility])   . COPD (chronic obstructive pulmonary disease) ([facility])    per patient's health survey - he put a ?  . Depression   . Erectile dysfunction due to arterial insufficiency   . GERD (gastroesophageal reflux disease)   . Hyperlipidemia   . Hypertension   . Non-small cell carcinoma of lung, right ([place]) 06/24/2015  . Shortness of breath   . Ulcer     ALLERGIES:  is allergic to aspirin and morphine and related.  MEDICATIONS:  Current Outpatient Medications  Medication Sig Dispense Refill  . albuterol (VENTOLIN HFA) 108 (90 Base) MCG/ACT inhaler INHALE USING 2 PUFFS EVERY 4 HOURS AS NEEDED ONLY IF YOUR CAN'T CATCH YOUR  BREATH 18 g 1  . amitriptyline (ELAVIL) 25 MG tablet TAKE 1 TABLET (25 MG TOTAL) BY MOUTH AT BEDTIME AS NEEDED FOR SLEEP. 90 tablet 0  . ANORO ELLIPTA 62.5-25 MCG/INH AEPB INHALE 1 PUFF BY MOUTH EVERY DAY 60 each 2  . bisoprolol-hydrochlorothiazide (ZIAC) 5-6.25 MG tablet Take 1 tablet by mouth daily. 90 tablet 1  . docusate sodium (STOOL SOFTENER) 100 MG capsule Per bottle as needed    . famotidine (PEPCID) 40 MG tablet Take 1 tablet (40 mg total) by mouth 2 (two) times daily. 180 tablet 3  . Glycopyrrolate-Formoterol (BEVESPI AEROSPHERE) 9-4.8 MCG/ACT AERO Inhale 2 puffs into the lungs 2 (two) times daily. 8 g 0  . HYDROcodone-acetaminophen (NORCO/VICODIN) 5-325 MG tablet Take 1 tablet by mouth every 8 (eight) hours as needed for moderate pain. 90 tablet 0  . lansoprazole (PREVACID) 30 MG capsule TAKE 1 CAPSULE (30 MG TOTAL) BY MOUTH 2 (TWO) TIMES DAILY. 180 capsule 1  . levocetirizine (XYZAL) 5 MG tablet TAKE 1 TABLET BY MOUTH EVERY DAY AS NEEDED FOR DRIPPY NOSE 90 tablet 1  . OXYGEN 2lpm with exertion only    . pravastatin (PRAVACHOL) 40 MG tablet Take 1 tablet (40 mg total) by mouth at bedtime. 90 tablet 3   No current facility-administered medications for this visit.     SURGICAL HISTORY:  Past Surgical History:  Procedure Laterality Date  . ANTERIOR FUSION LUMBAR SPINE  07/25/2012  .  ANTERIOR LAT LUMBAR FUSION Left 07/25/2012   Procedure: ANTERIOR LATERAL LUMBAR FUSION 1 LEVEL;  Surgeon: [name], MD;  Location: [facility];  Service: Orthopedics;  Laterality: Left;  Left sided lumbar 3-4 lateral interbody fusion with allograft and instrumentation  . COLONOSCOPY  14   polyps rem  . JOINT REPLACEMENT Right 2010  . NODE DISSECTION Right 05/24/2015   Procedure: NODE DISSECTION;  Surgeon: [name], MD;  Location: [facility];  Service: Thoracic;  Laterality: Right;  . SEGMENTECOMY Right 05/24/2015   Procedure: RIGHT LOWER LOBE SUPERIOR SEGMENTECTOMY;  Surgeon: [name], MD;  Location: [facility];  Service: Thoracic;  Laterality: Right;  . [place]  2012  . VIDEO ASSISTED THORACOSCOPY (VATS)/WEDGE RESECTION Right 05/24/2015   Procedure: RIGHT VIDEO ASSISTED THORACOSCOPY (VATS)/ RIGHTR LOWER LOBE WEDGE RESECTION;  Surgeon: [name], MD;  Location: [facility];  Service: Thoracic;  Laterality: Right;    REVIEW OF SYSTEMS:  A comprehensive review of systems was negative.   PHYSICAL EXAMINATION: General appearance: alert, cooperative and no distress Head: Normocephalic, without obvious abnormality, atraumatic Neck: no adenopathy, no JVD, supple, symmetrical, trachea midline and thyroid not enlarged, symmetric, no tenderness/mass/nodules Lymph nodes: Cervical, supraclavicular, and axillary nodes normal. Resp: clear to auscultation bilaterally Back: symmetric, no curvature. ROM normal. No CVA tenderness. Cardio: regular rate and rhythm, S1, S2 normal, no murmur, click, rub or gallop GI: soft, non-tender; bowel sounds normal; no masses,  no organomegaly Extremities: extremities normal, atraumatic, no cyanosis or edema  ECOG PERFORMANCE STATUS: 1 - Symptomatic but completely ambulatory  There were no vitals taken for this visit.  LABORATORY DATA: Lab Results  Component Value Date   WBC 5.4 12/20/2018   HGB 16.9 12/20/2018   HCT 50.7 12/20/2018   MCV 87.4 12/20/2018   PLT 210 12/20/2018      Chemistry      Component Value Date/Time   NA 140 12/20/2018 1226   NA 141 12/25/2016 1010   K 3.6 12/20/2018 1226   K 3.6 12/25/2016 1010   CL 101 12/20/2018 1226   CO2 30 12/20/2018 1226   CO2 28 12/25/2016 1010   BUN 7 (L) 12/20/2018 1226   BUN 6.1 (L) 12/25/2016 1010   CREATININE 1.12 12/20/2018 1226   CREATININE 1.1 12/25/2016 1010      Component Value Date/Time   CALCIUM 9.3 12/20/2018 1226   CALCIUM 9.6 12/25/2016 1010   ALKPHOS 94 12/20/2018 1226   ALKPHOS 107 12/25/2016 1010   AST 23 12/20/2018 1226   AST 19 12/25/2016 1010    ALT 15 12/20/2018 1226   ALT 15 12/25/2016 1010   BILITOT 0.7 12/20/2018 1226   BILITOT 0.56 12/25/2016 1010       RADIOGRAPHIC STUDIES: Ct Chest W Contrast  Result Date: 12/20/2018 CLINICAL DATA:  Follow-up right lung cancer. EXAM: CT CHEST WITH CONTRAST TECHNIQUE: Multidetector CT imaging of the chest was performed during intravenous contrast administration. CONTRAST:  24mL OMNIPAQUE IOHEXOL 300 MG/ML  SOLN COMPARISON:  12/21/2017. FINDINGS: Cardiovascular: Normal heart size. No pericardial effusion identified. Lad coronary artery calcification identified. Mediastinum/Nodes: No enlarged mediastinal, hilar, or axillary lymph nodes. Just below the carina there is a focal filling defect within the proximal right mainstem bronchus which measures 1.1 x 0.6 cm. Similar appearance of nodular filling defect on previous exam noted attributed to inspissated/adherent secretions. Lungs/Pleura: No pleural effusion identified. Moderate to advanced changes of centrilobular and paraseptal emphysema. Scarring within the right lower lobe is identified status  post wedge resection. Unchanged from previous exam. No suspicious pulmonary nodule or mass identified. Upper Abdomen: No acute abnormality. Musculoskeletal: No chest wall abnormality. No acute or significant osseous findings. IMPRESSION: 1. Stable postsurgical change in the right lower lobe. No evidence of local recurrence or metastatic disease. 2. Moderate to severe emphysema.  Emphysema (ICD10-J43.9). 3. Nodular filling defect in the proximal right mainstem bronchus is noted, similar to previous exam. Favor inspissated/adherent secretions 4. Coronary artery calcifications. Aortic Atherosclerosis (ICD10-I70.0) and Emphysema (ICD10-J43.9). Electronically Signed   By: Kerby Moors M.D.   On: 12/20/2018 14:42    ASSESSMENT AND PLAN:  This is a very pleasant 68 years old African-American male with a stage IA non-small cell lung cancer status post wedge  resection of the right lower lobe in 2017. The patient is currently on observation and he is feeling fine with no concerning complaints except for occasional shortness of breath secondary to COPD and he is followed by Dr. Melvyn Novas. He had repeat CT scan of the chest performed recently.  I personally and independently reviewed the scans and discussed the results with the patient today. Has a scan showed no concerning findings for disease recurrence or metastasis. I recommended for the patient to continue on observation with repeat CT scan of the chest in 1 year. For hypertension he will continue with his current blood pressure medication and monitor it closely at home. The patient was advised to call immediately if he has any concerning symptoms in the interval. All questions were answered. The patient knows to call the clinic with any problems, questions or concerns. We can certainly see the patient much sooner if necessary. I spent 10 minutes counseling the patient face to face. The total time spent in the appointment was 15 minutes.  Disclaimer: This note was dictated with voice recognition software. Similar sounding words can inadvertently be transcribed and may not be corrected upon review.

## 2018-12-26 ENCOUNTER — Telehealth: Payer: Self-pay | Admitting: Internal Medicine

## 2018-12-26 NOTE — Telephone Encounter (Signed)
Scheduled per los. Called and left msg. Mailed printout  °

## 2019-01-21 ENCOUNTER — Telehealth: Payer: Self-pay | Admitting: Internal Medicine

## 2019-01-21 MED ORDER — BEVESPI AEROSPHERE 9-4.8 MCG/ACT IN AERO: 2.0000 | INHALATION_SPRAY | Freq: Two times a day (BID) | RESPIRATORY_TRACT | 5 refills | Status: DC

## 2019-01-21 NOTE — Telephone Encounter (Signed)
Called and spoke to patient. Patient stated that he would like a refill of Bevespi sent to [facility]. Patient stated that the Anoro is too expensive. Per Dr. Gustavus Bryant last OV patient can use either. Rx refill sent. Nothing further needed at this time.

## 2019-01-23 ENCOUNTER — Telehealth: Payer: Self-pay | Admitting: Internal Medicine

## 2019-01-23 NOTE — Telephone Encounter (Signed)
Called and spoke with pt letting him know that we did not have any samples and he verbalized understanding. Nothing further needed.

## 2019-02-13 ENCOUNTER — Other Ambulatory Visit: Payer: Self-pay | Admitting: Internal Medicine

## 2019-02-18 ENCOUNTER — Telehealth: Payer: Self-pay | Admitting: *Deleted

## 2019-02-18 ENCOUNTER — Other Ambulatory Visit: Payer: Self-pay | Admitting: Emergency Medicine

## 2019-02-18 NOTE — Telephone Encounter (Signed)
Spoke to patient about the refill request for Xyzal, I advised him the last office visit was 11/16/2017 with Dr Mitchel Honour. He needs to schedule an appointment to be reevaluated. Patient states he has an appointment with the Pulmonologist and will ask for the refill. Also, I advised him if not schedule an appointment to return at this office.

## 2019-02-18 NOTE — Telephone Encounter (Signed)
Requested medication (s) are due for refill today: yes  Requested medication (s) are on the active medication list: yes  Last refill:  11/16/2018  Future visit scheduled: no  Notes to clinic:  no valid encounter within last 12 months Review for refill   Requested Prescriptions  Pending Prescriptions Disp Refills   levocetirizine (XYZAL) 5 MG tablet [Pharmacy Med Name: LEVOCETIRIZINE 5 MG TABLET] 90 tablet 1    Sig: TAKE 1 TABLET BY MOUTH EVERY DAY AS NEEDED FOR DRIPPY NOSE      Ear, Nose, and Throat:  Antihistamines Failed - 02/18/2019  1:36 AM      Failed - Valid encounter within last 12 months    Recent Outpatient Visits           1 year ago Essential hypertension   [facility], Laurey Arrow, MD   1 year ago Arthralgia of both hands   [facility] at [facility], Ines Bloomer, MD   1 year ago Abnormal colonoscopy   [facility], Laurey Arrow, MD   1 year ago Essential hypertension   [facility], Laurey Arrow, MD   1 year ago Essential hypertension   [facility], Laurey Arrow, MD       Future Appointments             In 3 months Melvyn Novas, Christena Deem, MD [facility]

## 2019-02-28 ENCOUNTER — Encounter: Payer: Self-pay | Admitting: Adult Health

## 2019-02-28 ENCOUNTER — Other Ambulatory Visit: Payer: Self-pay

## 2019-02-28 ENCOUNTER — Ambulatory Visit (INDEPENDENT_AMBULATORY_CARE_PROVIDER_SITE_OTHER): Payer: Medicare Other | Admitting: Adult Health

## 2019-02-28 DIAGNOSIS — J069 Acute upper respiratory infection, unspecified: Secondary | ICD-10-CM

## 2019-02-28 DIAGNOSIS — J9611 Chronic respiratory failure with hypoxia: Secondary | ICD-10-CM

## 2019-02-28 DIAGNOSIS — J449 Chronic obstructive pulmonary disease, unspecified: Secondary | ICD-10-CM

## 2019-02-28 NOTE — Patient Instructions (Addendum)
Z-Pak take as directed Mucinex DM twice daily as needed for cough and congestion Prednisone taper over the next week Continue on Bevespi 2 puffs twice daily Albuterol inhaler as needed for wheezing Continue on oxygen 2 L with activity COVID 19 testing .  Follow-up with Dr. Melvyn Novas in 6-8 weeks and as needed Please contact office for sooner follow up if symptoms do not improve or worsen or seek emergency care

## 2019-02-28 NOTE — Progress Notes (Signed)
Virtual Visit via Telephone Note  I connected with Elijah Marshall on 02/28/19 at 10:30 AM EST by telephone and verified that I am speaking with the correct person using two identifiers.  Location: Patient: Home  Provider: Office    I discussed the limitations, risks, security and privacy concerns of performing an evaluation and management service by telephone and the availability of in person appointments. I also discussed with the patient that there may be a patient responsible charge related to this service. The patient expressed understanding and agreed to proceed.   History of Present Illness: 69 year old male former smoker, quit 2013, followed for gold 3 COPD and oxygen respiratory failure on oxygen with activity History of stage Ia non-small cell carcinoma of the right lung status post resection 2014   Today's televisit is for an acute office visit for COPD with increased cough and congestion.  Patient complains over 5 days of increased cough with pale yellow mucus shortness of breath and wheezing.  Mucus is very thick . Denies any fever, loss of taste or smell, hemoptysis, chest pain, orthopnea or edema.  No known sick contacts Patient remains on Bevespi twice daily.  Has increased his albuterol inhaler use over the last few days. Remains on Oxygen 2l/m with activity  Appetite is good .    Observations/Objective: 02/28/2019 speaks in full sentences with no audible wheezing or distress O2 sats 94% on room air .   Spirometry 03/01/12 FEV1 1.63 - Spirometry 09/05/13 FEV1 1.34 (46%) ratio 44  - PFTs 10/29/2013 FEV1 1.70 (62%) ratio 50% no better after ssaba and DLCO 48%  - PFT's 01/10/2016 FEV1 1.38 (51 % ) ratio 47 - PFT's 05/22/2017 FEV1 1.21 (46 % ) ratio 46 p 1 % improvement from saba p anoro and albuterol 1 h prior to study with DLCO 38 % corrects to 49 % for alv volume  - changed to Incruse 06/04/17 due to tremors >resolved   Assessment and Plan: COPD  exacerbation - acute respiratory sx , check covid 19 testing    Plan  Patient Instructions  Z-Pak take as directed Mucinex DM twice daily as needed for cough and congestion Prednisone taper over the next week Continue on Bevespi 2 puffs twice daily Albuterol inhaler as needed for wheezing Continue on oxygen 2 L with activity COVID 19 testing .  Follow-up with Dr. Melvyn Novas in 6-8 weeks and as needed Please contact office for sooner follow up if symptoms do not improve or worsen or seek emergency care        Follow Up Instructions: Follow up in 6 weeks and As needed     I discussed the assessment and treatment plan with the patient. The patient was provided an opportunity to ask questions and all were answered. The patient agreed with the plan and demonstrated an understanding of the instructions.   The patient was advised to call back or seek an in-person evaluation if the symptoms worsen or if the condition fails to improve as anticipated.  I provided 23  minutes of non-face-to-face time during this encounter.   Rexene Edison, NP

## 2019-03-03 ENCOUNTER — Telehealth: Payer: Self-pay | Admitting: Adult Health

## 2019-03-03 DIAGNOSIS — J189 Pneumonia, unspecified organism: Secondary | ICD-10-CM

## 2019-03-03 DIAGNOSIS — J069 Acute upper respiratory infection, unspecified: Secondary | ICD-10-CM

## 2019-03-03 NOTE — Telephone Encounter (Signed)
Spoke with patient. He stated that he went to [facility] on 02/28/19 and had a CXR. He was told that he had some "black spots" on his lungs. He wanted to discuss these spots. I did attempt to see if we could pull the CXR from [facility] but we can not. Asked patient if it was ok for me to request his CXR on his behalf, he verbalized yes.   He also wanted TP to know that [facility] prescribed him Levaquin 500mg  for 10 days and prednisone 20mg  for 7 days.   CXR impression has been requested. Will await fax.

## 2019-03-04 NOTE — Telephone Encounter (Signed)
Cxr report has not been received  [facility] @ [phone] and was on hold for >49mins.  Will call back.

## 2019-03-04 NOTE — Telephone Encounter (Signed)
I checked the downstairs box and Bpod folder and do not see cxr report- Jess have you see this? If not please advise and I will call back and request it again, thanks

## 2019-03-05 NOTE — Telephone Encounter (Signed)
Received CXR and placed in Elijah Marshall's folder

## 2019-03-05 NOTE — Telephone Encounter (Signed)
Spoke with Elijah Marshall at [facility] and requested cxr again

## 2019-03-05 NOTE — Telephone Encounter (Signed)
LMTCB for [facility] asking them to fax cxr report

## 2019-03-06 NOTE — Telephone Encounter (Signed)
Did he get COVID 19 testing as recommended.  CXR shows possible pNA in RML .  Please have him finish ZPack as directed  If not improving or worsens needs to go to ER .  Will need follow up in 2 weeks with CXR if covid 19 is negative   Please contact office for sooner follow up if symptoms do not improve or worsen or seek emergency care

## 2019-03-06 NOTE — Telephone Encounter (Signed)
Attempted to call pt but unable to reach. Left message for pt to return call. 

## 2019-03-06 NOTE — Telephone Encounter (Signed)
Sorry to hear this, wished he had called that they did not go to pharmacy would have re-sent them .  Would finish UC rx for levaquin and prednisone .  Follow up in 2 weeks in office with cxr   Please contact office for sooner follow up if symptoms do not improve or worsen or seek emergency care '

## 2019-03-06 NOTE — Telephone Encounter (Signed)
Called and spoke to pt. Pt states he was tested at the UC on 1/22, rapid and PCR and both resulted negative. Pt states he never received the zpak, per pt's chart this was not sent in. Pt states he has 3 tabs left of Levaquin 500mg , and 1 tab left of pred 20mg . Pt aware to continue taking these meds until complete. CXR order placed for 2 week return, pt aware. Pt aware to seek emergency care if any s/s worsen.   Will forward to TP to advise further if needed. Thanks.

## 2019-03-07 NOTE — Telephone Encounter (Signed)
Called and spoke with Patient. Patient stated he is aware of Tammy's recommendations. Patient stated he understands to come by office in 2 weeks to complete cxr.  Patient stated his wife is currently in the hospital, and he is unsure of when she is to be discharged home.  Patient stated he is feeling better, and would call to schedule OV, if things change.

## 2019-03-19 ENCOUNTER — Ambulatory Visit: Payer: Medicare Other | Admitting: Acute Care

## 2019-03-19 ENCOUNTER — Encounter: Payer: Self-pay | Admitting: Acute Care

## 2019-03-19 ENCOUNTER — Other Ambulatory Visit: Payer: Self-pay

## 2019-03-19 ENCOUNTER — Ambulatory Visit (INDEPENDENT_AMBULATORY_CARE_PROVIDER_SITE_OTHER): Payer: Medicare Other

## 2019-03-19 VITALS — BP 144/84 | HR 81 | Temp 97.0°F | Ht 69.0 in | Wt 180.0 lb

## 2019-03-19 DIAGNOSIS — J181 Lobar pneumonia, unspecified organism: Secondary | ICD-10-CM | POA: Diagnosis not present

## 2019-03-19 DIAGNOSIS — J9611 Chronic respiratory failure with hypoxia: Secondary | ICD-10-CM

## 2019-03-19 DIAGNOSIS — J441 Chronic obstructive pulmonary disease with (acute) exacerbation: Secondary | ICD-10-CM

## 2019-03-19 DIAGNOSIS — B370 Candidal stomatitis: Secondary | ICD-10-CM | POA: Diagnosis not present

## 2019-03-19 DIAGNOSIS — J189 Pneumonia, unspecified organism: Secondary | ICD-10-CM | POA: Diagnosis not present

## 2019-03-19 MED ORDER — NYSTATIN 100000 UNIT/ML MT SUSP: 10.0000 mL | Freq: Two times a day (BID) | OROMUCOSAL | 0 refills | Status: DC

## 2019-03-19 NOTE — Patient Instructions (Addendum)
It is good to see you today. Your CXR shows no acute issues We will prescribe Nystatin mouthwash Use 10 cc's twice daily , swish and spit. Continue Mucinex DM twice daily as needed for cough and congestion Continue on Bevespi 2 puffs twice daily Albuterol inhaler as needed for wheezing Continue on oxygen 2 L with activity.  Saturation goals are 88-92% Follow-up with Dr. Melvyn Novas in 6-8 weeks and as needed We will have him discuss Breztri with you.  Please contact office for sooner follow up if symptoms do not improve or worsen or seek emergency care

## 2019-03-19 NOTE — Progress Notes (Signed)
History of Present Illness Elijah Marshall is a  69 year old male former smoker, quit 2013, followed for gold 3 COPD and on oxygen at 2 L [place] with exertion 2/2 respiratory failure .  History of stage Ia non-small cell carcinoma of the right lung status post resection 2014. He is followed by Dr. Melvyn Marshall.    03/19/2019 2 week follow up for RML pneumonia/ COPD flare Pt. Presents for follow up. He was seen by Elijah Edison NP 02/28/2019. He was treated with Z pack and prednisone taper for suspected COPD exacerbation. Elijah Marshall also recommended that the patient get COVID tested as she was concerned that his symptoms could be related to COVID. He was COVID tested at an outside facility which he states was negative. I do not have any official results to view. He also had a CXR which was + for RML pneumonia. He had this done at an urgent care. He was prescribed Levaquin and a prednisone taper  by the urgent care MD. He took 500 mg once daily x 7 days. He completed the prednisone taper .  He is here today for repeat imaging to check for resolution of pneumonia.   Pt states he has been doing well. He is coughing up fewer secretions.  He does still have shortness of breath with exertion. He states this is his baseline exertion. He is not using his oxygen today in the office. He states he does use his oxygen at home as needed. He states he has had no fever, no chest or back pain.  He would like to discuss transitioning to [facility] after he see's Dr. Melvyn Marshall in 6-8 weeks.    Test Results: 03/19/2019 CXR Trachea is midline. Heart size normal. Lungs are hyperinflated. Postoperative scarring in the right midlung zone. No airspace consolidation or pleural fluid.  Spirometry 03/01/12 FEV1 1.63 - Spirometry 09/05/13 FEV1 1.34 (46%) ratio 44  - PFTs 10/29/2013 FEV1 1.70 (62%) ratio 50% no better after ssaba and DLCO 48%  - PFT's 01/10/2016 FEV1 1.38 (51 % ) ratio 47 - PFT's 05/22/2017 FEV1 1.21 (46  % ) ratio 46 p 1 % improvement from saba p anoro and albuterol 1 h prior to study with DLCO 38 % corrects to 49 % for alv volume  - changed to Incruse 06/04/17 due to tremors >resolved   CBC Latest Ref Rng & Units 12/20/2018 12/21/2017 09/07/2017  WBC 4.0 - 10.5 K/uL 5.4 6.6 6.8  Hemoglobin 13.0 - 17.0 g/dL 16.9 16.3 17.2(H)  Hematocrit 39.0 - 52.0 % 50.7 48.5 52.3(H)  Platelets 150 - 400 K/uL 210 232 265.0    BMP Latest Ref Rng & Units 12/20/2018 12/21/2017 09/07/2017  Glucose 70 - 99 mg/dL 90 97 95  BUN 8 - 23 mg/dL 7(L) 8 7  Creatinine 0.61 - 1.24 mg/dL 1.12 1.19 1.15  Sodium 135 - 145 mmol/L 140 140 137  Potassium 3.5 - 5.1 mmol/L 3.6 3.7 4.0  Chloride 98 - 111 mmol/L 101 102 99  CO2 22 - 32 mmol/L 30 28 31   Calcium 8.9 - 10.3 mg/dL 9.3 9.4 9.7    BNP No results found for: BNP  ProBNP No results found for: PROBNP  PFT    Component Value Date/Time   FEV1PRE 1.19 05/22/2017 1401   FEV1POST 1.21 05/22/2017 1401   FVCPRE 2.57 05/22/2017 1401   FVCPOST 2.66 05/22/2017 1401   TLC 6.83 05/22/2017 1401   DLCOUNC 10.88 05/22/2017 1401   PREFEV1FVCRT 46 05/22/2017 1401   PSTFEV1FVCRT  46 05/22/2017 1401    DG Chest 2 View  Result Date: 03/19/2019 CLINICAL DATA:  Congestion and right middle lobe pneumonia. EXAM: CHEST - 2 VIEW COMPARISON:  09/07/2017 and CT chest 12/20/2018. FINDINGS: Trachea is midline. Heart size normal. Lungs are hyperinflated. Postoperative scarring in the right midlung zone. No airspace consolidation or pleural fluid. IMPRESSION: Hyperinflation.  No acute findings. Electronically Signed   By: Elijah Marshall M.D.   On: 03/19/2019 13:45     Past medical hx Past Medical History:  Diagnosis Date  . Allergy   . Arthritis   . BPH with obstruction/lower urinary tract symptoms 2016   Nocturia  . Cancer [facility])    unsure at this time  . Cancer of lower lobe of right lung ([place])   . COPD (chronic obstructive pulmonary disease) ([place])    per patient's health  survey - he put a ?  . Depression   . Erectile dysfunction due to arterial insufficiency   . GERD (gastroesophageal reflux disease)   . Hyperlipidemia   . Hypertension   . Non-small cell carcinoma of lung, right ([place]) 06/24/2015  . Shortness of breath   . Ulcer      Social History   Tobacco Use  . Smoking status: Former Smoker    Packs/day: 1.00    Years: 35.00    Pack years: 35.00    Types: Cigarettes    Quit date: 09/24/2011    Years since quitting: 7.4  . Smokeless tobacco: Never Used  Substance Use Topics  . Alcohol use: No    Alcohol/week: 0.0 standard drinks    Comment: weekly  . Drug use: No    Mr.Elijah Marshall reports that he quit smoking about 7 years ago. His smoking use included cigarettes. He has a 35.00 pack-year smoking history. He has never used smokeless tobacco. He reports that he does not drink alcohol or use drugs.  Tobacco Cessation: Former smoker with a 35 pack year smoking history, Quit 2013  Past surgical hx, Family hx, Social hx all reviewed.  Current Outpatient Medications on File Prior to Visit  Medication Sig  . albuterol (VENTOLIN HFA) 108 (90 Base) MCG/ACT inhaler INHALE USING 2 PUFFS EVERY 4 HOURS AS NEEDED ONLY IF YOUR CAN'T CATCH YOUR BREATH  . amitriptyline (ELAVIL) 25 MG tablet TAKE 1 TABLET (25 MG TOTAL) BY MOUTH AT BEDTIME AS NEEDED FOR SLEEP.  Marland Kitchen ANORO ELLIPTA 62.5-25 MCG/INH AEPB INHALE 1 PUFF BY MOUTH EVERY DAY  . bisoprolol-hydrochlorothiazide (ZIAC) 5-6.25 MG tablet Take 1 tablet by mouth daily.  Marland Kitchen docusate sodium (STOOL SOFTENER) 100 MG capsule Per bottle as needed  . famotidine (PEPCID) 40 MG tablet Take 1 tablet (40 mg total) by mouth 2 (two) times daily.  . Glycopyrrolate-Formoterol (BEVESPI AEROSPHERE) 9-4.8 MCG/ACT AERO Inhale 2 puffs into the lungs 2 (two) times daily.  Marland Kitchen HYDROcodone-acetaminophen (NORCO/VICODIN) 5-325 MG tablet Take 1 tablet by mouth every 8 (eight) hours as needed for moderate pain.  Marland Kitchen lansoprazole (PREVACID) 30  MG capsule TAKE 1 CAPSULE (30 MG TOTAL) BY MOUTH 2 (TWO) TIMES DAILY.  Marland Kitchen levocetirizine (XYZAL) 5 MG tablet TAKE 1 TABLET BY MOUTH EVERY DAY AS NEEDED FOR DRIPPY NOSE  . OXYGEN 2lpm with exertion only  . pravastatin (PRAVACHOL) 40 MG tablet Take 1 tablet (40 mg total) by mouth at bedtime.  . Triamcinolone Acetonide (NASACORT ALLERGY 24HR NA) Place 1 spray into the nose at bedtime as needed.   No current facility-administered medications on file prior to visit.  Allergies  Allergen Reactions  . Aspirin Nausea And Vomiting  . Morphine And Related Itching    Review Of Systems:  Constitutional:   No  weight loss, night sweats,  Fevers, chills, fatigue, or  lassitude.  HEENT:   No headaches,  Difficulty swallowing,  Tooth/dental problems, or  Sore throat,                No sneezing, itching, ear ache, nasal congestion, post nasal drip,   CV:  No chest pain,  Orthopnea, PND, swelling in lower extremities, anasarca, dizziness, palpitations, syncope.   GI  No heartburn, indigestion, abdominal pain, nausea, vomiting, diarrhea, change in bowel habits, loss of appetite, bloody stools.   Resp: + Baseline  shortness of breath with exertion none at rest.  + excess mucus, + baseline  productive cough,  No non-productive cough,  No coughing up of blood.  No change in color of mucus.  + rare  wheezing.  No chest wall deformity  Skin: no rash or lesions.  GU: no dysuria, change in color of urine, no urgency or frequency.  No flank pain, no hematuria   MS:  No joint pain or swelling.  No decreased range of motion.  No back pain.  Psych:  No change in mood or affect. No depression or anxiety.  No memory loss.   Vital Signs BP (!) 144/84 (BP Location: Left Arm, Cuff Size: Large)   Pulse 81   Temp (!) 97 F (36.1 C) (Temporal)   Ht 5\' 9"  (1.753 m)   Wt 180 lb (81.6 kg)   SpO2 93%   BMI 26.58 kg/m    Physical Exam:  General- No distress,  A&Ox3, pleasant ENT: No sinus tenderness, TM  clear, pale nasal mucosa, +  oral exudate,no post nasal drip, no LAN Cardiac: S1, S2, regular rate and rhythm, no murmur Chest: No wheeze/ rales/ dullness; no accessory muscle use, no nasal flaring, no sternal retractions Abd.: Soft Non-tender Ext: No clubbing cyanosis, edema Neuro:  normal strength Skin: No rashes, warm and dry Psych: normal mood and behavior   Assessment/Plan  No problem-specific Assessment & Plan notes found for this encounter.  Patient Instructions  Resolved RML Pneumonia Completed Levaquin and Prednisone Taper CXR shows resolution Plan Continue Mucinex DM twice daily as needed for cough and congestion Continue on Bevespi 2 puffs twice daily Albuterol inhaler as needed for wheezing Continue on oxygen 2 L with activity.              Saturation goals are 88-92% Follow-up with Dr. Melvyn Marshall in 6-8 weeks and as needed Please contact office for sooner follow up if symptoms do not improve or worsen or seek emergency care   Oral Thrush Recent treatment with Levaquin and Pred Taper             Plan             We will prescribe Nystatin mouthwash             Use 10 cc's twice daily , swish and spit.             Make sure you rinse mouth after using your inhalers.   This appointment was over 40  minutes long with over 50% of the time being direct face to face patient care, assessment , plan of care , and follow up,    Magdalen Spatz, NP 03/19/2019  2:14 PM

## 2019-03-21 NOTE — Progress Notes (Signed)
Spoke with the pt and notified of results per Rexene Edison, NP and he verbalized understanding.

## 2019-04-11 ENCOUNTER — Ambulatory Visit: Payer: Medicare Other | Admitting: Internal Medicine

## 2019-04-11 ENCOUNTER — Ambulatory Visit (INDEPENDENT_AMBULATORY_CARE_PROVIDER_SITE_OTHER): Payer: Medicare Other | Admitting: Primary Care

## 2019-04-11 ENCOUNTER — Other Ambulatory Visit: Payer: Self-pay

## 2019-04-11 DIAGNOSIS — J449 Chronic obstructive pulmonary disease, unspecified: Secondary | ICD-10-CM | POA: Diagnosis not present

## 2019-04-11 DIAGNOSIS — J0101 Acute recurrent maxillary sinusitis: Secondary | ICD-10-CM | POA: Diagnosis not present

## 2019-04-11 MED ORDER — DOXYCYCLINE HYCLATE 100 MG PO TABS: 100.0000 mg | ORAL_TABLET | Freq: Two times a day (BID) | ORAL | 0 refills | Status: DC

## 2019-04-11 NOTE — Patient Instructions (Addendum)
Acute maxillary sinusitis - Rx doxycycline 1 tab twice daily x 7 days - Holding of on steroids as patient is getting second covid vaccine next week - Continue Nasacort and Xyzal  - Take mucinex twice daily

## 2019-04-11 NOTE — Progress Notes (Signed)
Virtual Visit via Telephone Note  I connected with Elijah Marshall on 04/11/19 at  2:30 PM EST by telephone and verified that I am speaking with the correct person using two identifiers.  Location: Patient: Home Provider: Office   I discussed the limitations, risks, security and privacy concerns of performing an evaluation and management service by telephone and the availability of in person appointments. I also discussed with the patient that there may be a patient responsible charge related to this service. The patient expressed understanding and agreed to proceed.   History of Present Illness: 69 year old male, former smoker quit 2013 (35 pack year hx). PMH significant for COPD GOLD III, chronic respiratory failture, chronic sinusitis, non-small cell carcinoma of right lung s/p lobectomy, GERD, hyperlipidemia. Patient of Dr. Melvyn Novas.   04/11/2019 Sinus congestion started 2 days ago. Associated maxillary facial pressure. He has been taking Xyzal and nasacort. He also has tried mucinex. No shortness of breath or chest tightness. Mild/rare wheezing. He is compliant with bevespi. He has not needed to use his rescue inhaler or oxygen. He is due to get his second covid vaccine next week.    Observations/Objective:  - Able to speak in full sentences, no shortness of breath or wheezing  - Audible nasal congestion   Pulmonary function testing: - PFTs   10/29/2013    FEV1    1.70 (62%) ratio 50% no better after ssaba and DLCO 48%  - PFT's  01/10/2016  FEV1 1.38 (51 % ) ratio 47  p 1 % improvement from saba p symb 160  prior to study with DLCO  43/41  % corrects to 47  % for alv volume   - PFT's  05/22/2017  FEV1 1.21  (46 % ) ratio 46  p 1 % improvement from saba p anoro and albuterol 1 h prior to study with DLCO  38 % corrects to 49 % for alv volume    Assessment and Plan:  Acute maxillary sinusitis - Rx doxycycline 1 tab twice daily x 7 days - Continue Nasacort and Xyzal as prescribed  -  Recommend mucinex twice daily and ocean nasal spray/Netti pot twice daily   Copd GOLD III: - Does not appear exacerbated  - Continue Bevespi 2 puffs twice daily; prn albuterol  - Holding of on steroids as patient is getting second covid vaccine next week  Follow Up Instructions:  - As needed if symptoms do not improve or worsen    I discussed the assessment and treatment plan with the patient. The patient was provided an opportunity to ask questions and all were answered. The patient agreed with the plan and demonstrated an understanding of the instructions.   The patient was advised to call back or seek an in-person evaluation if the symptoms worsen or if the condition fails to improve as anticipated.  I provided 18 minutes of non-face-to-face time during this encounter.   Martyn Ehrich, NP

## 2019-04-24 ENCOUNTER — Ambulatory Visit: Payer: Medicare Other | Admitting: Internal Medicine

## 2019-05-02 ENCOUNTER — Ambulatory Visit: Payer: Medicare Other | Admitting: Internal Medicine

## 2019-05-20 ENCOUNTER — Other Ambulatory Visit: Payer: Self-pay

## 2019-05-20 ENCOUNTER — Ambulatory Visit (INDEPENDENT_AMBULATORY_CARE_PROVIDER_SITE_OTHER): Payer: Medicare Other | Admitting: Internal Medicine

## 2019-05-20 ENCOUNTER — Encounter: Payer: Self-pay | Admitting: Internal Medicine

## 2019-05-20 DIAGNOSIS — J9611 Chronic respiratory failure with hypoxia: Secondary | ICD-10-CM

## 2019-05-20 DIAGNOSIS — J449 Chronic obstructive pulmonary disease, unspecified: Secondary | ICD-10-CM | POA: Diagnosis not present

## 2019-05-20 MED ORDER — BREZTRI AEROSPHERE 160-9-4.8 MCG/ACT IN AERO: 2.0000 | INHALATION_SPRAY | Freq: Two times a day (BID) | RESPIRATORY_TRACT | 11 refills | Status: DC

## 2019-05-20 MED ORDER — PREDNISONE 10 MG PO TABS: ORAL_TABLET | ORAL | 0 refills | Status: DC

## 2019-05-20 MED ORDER — BREZTRI AEROSPHERE 160-9-4.8 MCG/ACT IN AERO: 2.0000 | INHALATION_SPRAY | Freq: Two times a day (BID) | RESPIRATORY_TRACT | 0 refills | Status: DC

## 2019-05-20 NOTE — Patient Instructions (Addendum)
Plan A = Automatic = Always=    Change to breztri Take 2 puffs first thing in am and then another 2 puffs about 12 hours later.    Plan B = Backup (to supplement plan A, not to replace it) Only use your albuterol inhaler as a rescue medication to be used if you can't catch your breath by resting or doing a relaxed purse lip breathing pattern.  - The less you use it, the better it will work when you need it. - Ok to use the inhaler up to 2 puffs  every 4 hours if you must but call for appointment if use goes up over your usual need - Don't leave home without it !!  (think of it like the spare tire for your car)   Make sure you check your oxygen saturations at highest level of activity to be sure it stays over 90% and adjust upward to maintain this level if needed but remember to turn it back to previous settings when you stop (to conserve your supply).    Please schedule a follow up visit in 6  months but call sooner if needed

## 2019-05-20 NOTE — Progress Notes (Signed)
Subjective:   Patient ID: Elijah Marshall, male    DOB: March 15, 1950  MRN: [id number]   Brief patient profile:  68  yobm quit smoking 2013  and able work out at [employer] and did fine until hurt back 2011 then 2 back surgeries and knee surgery  and more noticeable doe  since then but also limited by back and s/p sup segmentectomy RLL NSC lung ca Ia 05/2015  referred to pulmonary clinic 09/05/2013 for ? Copd  With GOLD II criteria 10/29/13 and confirmed 01/10/2016     History of Present Illness  09/05/2013 1st [facility] office visit/ Lajuanda Penick  Chief Complaint  Patient presents with  . Pulmonary Consult    Referred per Dr. Norberto Sorenson.  Pt c/o SOB for the past 3 yrs. He states that he was dxed with COPD back in 2012 or 2013.  He states that that he sometimes has trouble with breathing when walking up stairs and lifting things.    rx spiriva first, alb, dulera not as effective  No problem at rest or supine Assoc nasal congestion much better p shot (?depomedrol) and bad again x sev years corresponds to worse doe  Ex = walking one mile slower pace than wife,trouble with hills, worse in heat  Bad hb even on dexilant  Min dry cough  rec Stop spiriva  Start anoro two puffs off one click each am Zantac 150 mg one at  Bedtime Prednisone 10 mg take  4 each am x 2 days,   2 each am x 2 days,  1 each am x 2 days and stop  GERD diet    04/19/2017  f/u ov/Eltha Tingley re:  GOLD II copd /  Chief Complaint  Patient presents with  . Follow-up    Patient was told to follow up with formulary in hand to discuss medications.   Dyspnea:   improved with walking back up  to 30 min on 2lpm  Cough: none Sleep: no resp problems SABA use:  No change on bevespi vs symbicort / concerned with cost issues rec Plan A = Automatic = Bevespi 2 every 12 hours or ANORO one click each am  But pick the one that works the best for you and if they are both the same then chose the less costly  Plan B = Backup Only use your albuterol  as a rescue medication   Could not tol anoro due to tremors > resolved on Incruse        12/28/2017  f/u ov/Juliann Olesky re:  GOLD III/ incruse maint and 02 on 2lpm ex / no med calendar  Chief Complaint  Patient presents with  . Follow-up    No more hemoptysis since last visit. He is using his albuterol inhaler 3 x per wk on average.   Dyspnea:  Baseline = MMRC2 = can't walk a nl pace on a flat grade s sob but does fine slow and flat on 2lpm  Cough: improved/ min mucoid no disturbing sleep  Sleeping: flat SABA use: minimal use but could not tell med when to start or how often he can use saba if worse (instructions are on action plan of med calendar he forgot to bring)  rec Please schedule a follow up visit in 3 months but call sooner if needed  - bring your medication calendar and drug formulary so we can see if there is a cheaper alternative under your present drug plan  -add: advise that p review of ct  chest he needs to call for any recurrent hemoptysis to arrange f/u    03/01/18 NP eval Doxycycline 100mg  Twice daily  For 1 week , take with food.  Mucinex DM Twice daily  As needed  Cough/congestion  Saline nasal rinses As needed   Salt water gargles As needed   Xyzal daily for 1 week then As needed   Continue on INCRUSE 1 puff daily , rinse after use.  Follow up with Dr. Sherene Sires  As planned and As needed      04/01/2018  f/u ov/Toniette Devera re: GOLD III/ maint incruse as cannot tol laba/ 02 2lpm with ex / no med calendar  Chief Complaint  Patient presents with  . Follow-up    Breathing is about the same. He is using his rescue inhaler about 3 x per wk.   Dyspnea:  MMRC2 = can't walk a nl pace on a flat grade s sob but does fine slow and flat  Cough: occ Sleeping: on side/ bed is flat/ 2 pillows SABA use: as above 02: 2lpm prn  rec Plan A = Automatic = Bevespi Take 2 puffs first thing in am and then another 2 puffs about 12 hours later.  Work on inhaler technique:    Plan B = Backup Only  use your albuterol inhaler/ventolin  as a rescue medication See Tammy NP w/in 3 months  With your drug formulary  all your medication   12/17/2018  f/u ov/Aroush Chasse re: GOLD III maint on anoro but having more "throat congestion" Chief Complaint  Patient presents with  . Follow-up    Pt states feels like sputum in the throat, not coming up. He has also noticed some wheezing. He is using his albuterol inhaler 2-3 x per wk on average.   Dyspnea:  Uses [employer], uses [employer] and able to do an aisle or two then stops not using  02 or monitoring sats  Cough: feels like something stuck in throat Sleeping: on side/ bed is flat / 2 pillows SABA use:  Few times a week 02: prn 2 lpm portable pulse not typically using  Elavil hs also x years denies mouth dry rec Plan A = Automatic = Always=   Either Anoro or  Bevespi Take 2 puffs first thing in am and then another 2 puffs about 12 hours later.  Work on inhaler technique: Plan B = Backup (to supplement plan A, not to replace it) Only use your albuterol (ventolin)  inhaler Reminded: Make sure you check your oxygen saturations at highest level of activity to be sure it stays over 90% and adjust upward to maintain this level if needed but remember to turn it back to previous settings when you stop (to conserve your supply).    05/20/2019  f/u ov/Baley Shands re: GOLD III/ 02 prn on bevespi Chief Complaint  Patient presents with  . Follow-up    Breathing has been worse off and on over the past month. He has noticed some wheezing. He is using his rescue inhaler 3 x per wk.   Dyspnea:  MMRC3 = can't walk 100 yards even at a slow pace at a flat grade s stopping due to sob  = can't finish [employer] s stopping / assoc wheeze Cough: none Sleeping: ok flat/ 2 pillows  SABA use: as above  02: just prn    No obvious day to day or daytime variability or assoc excess/ purulent sputum or mucus plugs or hemoptysis or cp or chest tightness,  or  overt sinus or hb symptoms.    Sleeping as above without nocturnal  or early am exacerbation  of respiratory  c/o's or need for noct saba. Also denies any obvious fluctuation of symptoms with weather or environmental changes or other aggravating or alleviating factors except as outlined above   No unusual exposure hx or h/o childhood pna/ asthma or knowledge of premature birth.  Current Allergies, Complete Past Medical History, Past Surgical History, Family History, and Social History were reviewed in [employer] record.  ROS  The following are not active complaints unless bolded Hoarseness, sore throat, dysphagia, dental problems, itching, sneezing,  nasal congestion or discharge of excess mucus or purulent secretions, ear ache,   fever, chills, sweats, unintended wt loss or wt gain, classically pleuritic or exertional cp,  orthopnea pnd or arm/hand swelling  or leg swelling, presyncope, palpitations, abdominal pain, anorexia, nausea, vomiting, diarrhea  or change in bowel habits or change in bladder habits, change in stools or change in urine, dysuria, hematuria,  rash, arthralgias, visual complaints, headache, numbness, weakness or ataxia or problems with walking or coordination,  change in mood or  memory.        Current Meds  Medication Sig  . albuterol (VENTOLIN HFA) 108 (90 Base) MCG/ACT inhaler INHALE USING 2 PUFFS EVERY 4 HOURS AS NEEDED ONLY IF YOUR CAN'T CATCH YOUR BREATH  . amitriptyline (ELAVIL) 25 MG tablet TAKE 1 TABLET (25 MG TOTAL) BY MOUTH AT BEDTIME AS NEEDED FOR SLEEP.  Marland Kitchen bisoprolol-hydrochlorothiazide (ZIAC) 5-6.25 MG tablet Take 1 tablet by mouth daily.  . famotidine (PEPCID) 40 MG tablet Take 1 tablet (40 mg total) by mouth 2 (two) times daily.  . Glycopyrrolate-Formoterol (BEVESPI AEROSPHERE) 9-4.8 MCG/ACT AERO Inhale 2 puffs into the lungs 2 (two) times daily.  Marland Kitchen HYDROcodone-acetaminophen (NORCO/VICODIN) 5-325 MG tablet Take 1 tablet by mouth every 8 (eight) hours as needed for  moderate pain.  Marland Kitchen lansoprazole (PREVACID) 30 MG capsule TAKE 1 CAPSULE (30 MG TOTAL) BY MOUTH 2 (TWO) TIMES DAILY.  Marland Kitchen levocetirizine (XYZAL) 5 MG tablet TAKE 1 TABLET BY MOUTH EVERY DAY AS NEEDED FOR DRIPPY NOSE  . nystatin (MYCOSTATIN) 100000 UNIT/ML suspension Take 10 mLs (1,000,000 Units total) by mouth 2 (two) times daily.  . OXYGEN 2lpm with exertion only  . polyethylene glycol (MIRALAX / GLYCOLAX) 17 g packet Take 17 g by mouth daily as needed.  . pravastatin (PRAVACHOL) 40 MG tablet Take 1 tablet (40 mg total) by mouth at bedtime.  . Triamcinolone Acetonide (NASACORT ALLERGY 24HR NA) Place 1 spray into the nose at bedtime as needed.                  Objective:   Physical Exam   05/20/2019       178   12/17/2018      182  04/01/2018        182  12/28/2017      176  08/27/2017        176  06/22/2017        177  05/22/2017        183  04/19/2017        179      07/31/2016        182  05/16/2016        181  04/28/2016        185  01/10/2016        179    11/19/15 182 lb (82.6 kg)  11/11/15 180  lb (81.6 kg)  11/04/15 181 lb 3.5 oz (82.2 kg)    amb bm nad   Vital signs reviewed  05/20/2019  - Note at rest 02 sats  95% on RA      HEENT : pt wearing mask not removed for exam due to covid -19 concerns.    NECK :  without JVD/Nodes/TM/ nl carotid upstrokes bilaterally   LUNGS: no acc muscle use,  Mod barrel  contour chest wall with bilateral  Distant bs s audible wheeze and  without cough on insp or exp maneuvers and mod  Hyperresonant  to  percussion bilaterally     CV:  RRR  no s3 or murmur or increase in P2, and no edema   ABD:  soft and nontender with pos mid insp Hoover's  in the supine position. No bruits or organomegaly appreciated, bowel sounds nl  MS:     ext warm without deformities, calf tenderness, cyanosis or clubbing No obvious joint restrictions   SKIN: warm and dry without lesions    NEURO:  alert, approp, nl sensorium with  no motor or cerebellar deficits  apparent.

## 2019-05-22 ENCOUNTER — Encounter: Payer: Self-pay | Admitting: Internal Medicine

## 2019-05-22 NOTE — Assessment & Plan Note (Signed)
Quit smoking 2013 - Spirometry  03/01/12  FEV1  1.63 - Spirometry   09/05/13 FEV1  1.34 (46%) ratio 44  - PFTs   10/29/2013    FEV1    1.70 (62%) ratio 50% no better after ssaba and DLCO 48%  - 09/05/2013  Walked RA x 3 laps @ fast pace @  185 ft each stopped due to end of study, fast pace,sat 89% at end  - 01/06/14 gradual worsening on anoro plus increase need for saba  - 01/28/2014   > try spiriva respimat/ symbicort 160 2bid > changed to symbicort 160 2bid only on 01/28/14  - 02/25/2014   90%   > changed to symbicort 80 2bid since hoarse on 160 and clear on exam  - 11/19/2015 rechallenge with symbicort 160 Take 2 puffs first thing in am and then another 2 puffs about 12 hours later.  - PFT's  01/10/2016  FEV1 1.38 (51 % ) ratio 47  p 1 % improvement from saba p symb 160  prior to study with DLCO  43/41  % corrects to 47  % for alv volume   - 02/21/2017  After extensive coaching inhaler device  effectiveness =    90% vs baseline < 25%  - 02/21/2017 changed to bevespi 2 bid  - 04/19/2017  Demonstrated elipta > try anoro to see if cost less/ works as well  - PFT's  05/22/2017  FEV1 1.21  (46 % ) ratio 46  p 1 % improvement from saba p anoro and albuterol 1 h prior to study with DLCO  38 % corrects to 49 % for alv volume   - changed to Incruse 06/04/17 due to tremors > resolved  - 04/01/2018    try change to bevespi 1-2 bid (one bid if too tremulous from 2)  - 10/21/2018 changed to anoro > globus sensation reported 12/17/2018  -12/17/2018   bevespi to see if throat symptoms improve  - 05/20/2019  After extensive coaching inhaler device,  effectiveness =  90% > changed to breztri    Group D in terms of symptom/risk and laba/lama/ICS  therefore appropriate rx at this point >>>  breztri trial approp at this point

## 2019-05-22 NOTE — Assessment & Plan Note (Signed)
Dx 2017 SATURATION QUALIFICATIONS:  11/19/2015  Patient Saturations on Room Air at Rest = 96% Patient Saturations on Room Air while Ambulating = 86% Patient Saturations on 2  Liters of oxygen while Ambulating = 96% - 05/16/2016 :  Patient Saturations on Room Air at Rest = 88%--- increased to 99% on 2lpm continuous with activity    Advised: Make sure you check your oxygen saturations at highest level of activity to be sure it stays over 90% and adjust upward to maintain this level if needed but remember to turn it back to previous settings when you stop (to conserve your supply).           Each maintenance medication was reviewed in detail including emphasizing most importantly the difference between maintenance and prns and under what circumstances the prns are to be triggered using an action plan format where appropriate.  Total time for H and P, chart review, counseling, teaching device and generating customized AVS unique to this office visit / charting = 20 min

## 2019-06-09 ENCOUNTER — Telehealth: Payer: Self-pay | Admitting: Internal Medicine

## 2019-06-09 NOTE — Telephone Encounter (Signed)
Very unlikely but any irritation should be treatable / avoidable by using arm and hammer toothpast immediately after breztri - brush teeth, tongue, make slurry and gargle and we'll regroup at next ov

## 2019-06-09 NOTE — Telephone Encounter (Signed)
Patient last seen 4.13.21 with Dr Melvyn Novas and changed to [facility] 2puffs twice daily.  Called spoke with patient, hemoptysis x1 episode 4-5 days ago.  Happened gain yesterday after trying to clear his throat.   BRB streaks mixed with clear mucus - noticed this 2-3 times after the first, decreasing each time.  Reports throat was kind of scratchy and raspy but patient attributed this to the [facility].  No hemoptysis last or today thus far.   Denies any increased shortness of breath, wheezing, tightness in chest, f/c/s, increased chest congestion, known sick contacts.    Patient is asking if this could possibly be from the [facility] or something else.    Patient does have appt with MW on 5/10.  Dr Melvyn Novas please advise, thank you.

## 2019-06-09 NOTE — Telephone Encounter (Signed)
Spoke with pt, aware of recs.  Rescheduled ov to 5/13 to discuss further.  Nothing further needed at this time- will close encounter.

## 2019-06-16 ENCOUNTER — Ambulatory Visit: Payer: Medicare Other | Admitting: Internal Medicine

## 2019-06-19 ENCOUNTER — Ambulatory Visit: Payer: Medicare Other | Admitting: Internal Medicine

## 2019-07-28 ENCOUNTER — Ambulatory Visit: Payer: Medicare Other | Admitting: Internal Medicine

## 2019-07-28 ENCOUNTER — Other Ambulatory Visit: Payer: Self-pay

## 2019-07-28 ENCOUNTER — Encounter: Payer: Self-pay | Admitting: Internal Medicine

## 2019-07-28 DIAGNOSIS — J449 Chronic obstructive pulmonary disease, unspecified: Secondary | ICD-10-CM

## 2019-07-28 DIAGNOSIS — J9611 Chronic respiratory failure with hypoxia: Secondary | ICD-10-CM | POA: Diagnosis not present

## 2019-07-28 NOTE — Patient Instructions (Signed)
No change in medications  Make sure you check your oxygen saturations at highest level of activity (walking at [employer]) to be sure it stays over 90% and adjust upward to maintain this level if needed but remember to turn it back to previous settings when you stop (to conserve your supply).    Please schedule a follow up visit in 6 months but call sooner if needed

## 2019-07-28 NOTE — Assessment & Plan Note (Signed)
Dx 2017 SATURATION QUALIFICATIONS:  11/19/2015  Patient Saturations on Room Air at Rest = 96% Patient Saturations on Room Air while Ambulating = 86% Patient Saturations on 2  Liters of oxygen while Ambulating = 96% - 05/16/2016 :   Saturations on Room Air at Rest = 88%--- increased to 99% on 2lpm continuous with activity   -  07/28/2019   Walked RA  2 laps @ approx 277ft each @ avg pace  stopped due to  sob with sats still 91%   Advised: Make sure you check your oxygen saturations at highest level of activity to be sure it stays over 90% and either slow down or  adjust 02 upward to maintain this level if needed but remember to turn it back to previous settings when you stop (to conserve your supply).          Each maintenance medication was reviewed in detail including emphasizing most importantly the difference between maintenance and prns and under what circumstances the prns are to be triggered using an action plan format where appropriate.  Total time for H and P, chart review, counseling, reviewing devices (02 systems and inhalers)  directly observing portions of ambulatory 02 saturation study/  and generating customized AVS unique to this office visit / charting = 30 min

## 2019-07-28 NOTE — Progress Notes (Signed)
Subjective:   Patient ID: Elijah Marshall, male    DOB: [birth year]  MRN: [id number]   Brief patient profile:  69  yobm quit smoking 2013  and able work out at [employer] and did fine until hurt back 2011 then 2 back surgeries and knee surgery  and more noticeable doe  since then but also limited by back and s/p sup segmentectomy RLL NSC lung ca Ia 05/2015  referred to pulmonary clinic 09/05/2013 for ? Copd  With GOLD II criteria 10/29/13 and confirmed 01/10/2016     History of Present Illness  09/05/2013 1st [facility] office visit/ Ailany Koren  Chief Complaint  Patient presents with  . Pulmonary Consult    Referred per Dr. Norberto Sorenson.  Pt c/o SOB for the past 3 yrs. He states that he was dxed with COPD back in 2012 or 2013.  He states that that he sometimes has trouble with breathing when walking up stairs and lifting things.    rx spiriva first, alb, dulera not as effective  No problem at rest or supine Assoc nasal congestion much better p shot (?depomedrol) and bad again x sev years corresponds to worse doe  Ex = walking one mile slower pace than wife,trouble with hills, worse in heat  Bad hb even on dexilant  Min dry cough  rec Stop spiriva  Start anoro two puffs off one click each am Zantac 150 mg one at  Bedtime Prednisone 10 mg take  4 each am x 2 days,   2 each am x 2 days,  1 each am x 2 days and stop  GERD diet    04/19/2017  f/u ov/Lois Slagel re:  GOLD II copd /  Chief Complaint  Patient presents with  . Follow-up    Patient was told to follow up with formulary in hand to discuss medications.   Dyspnea:   improved with walking back up  to 30 min on 2lpm  Cough: none Sleep: no resp problems SABA use:  No change on bevespi vs symbicort / concerned with cost issues rec Plan A = Automatic = Bevespi 2 every 12 hours or ANORO one click each am  But pick the one that works the best for you and if they are both the same then chose the less costly  Plan B = Backup Only use your albuterol  as a rescue medication   Could not tol anoro due to tremors > resolved on Incruse        12/28/2017  f/u ov/Nathaniel Yaden re:  GOLD III/ incruse maint and 02 on 2lpm ex / no med calendar  Chief Complaint  Patient presents with  . Follow-up    No more hemoptysis since last visit. He is using his albuterol inhaler 3 x per wk on average.   Dyspnea:  Baseline = MMRC2 = can't walk a nl pace on a flat grade s sob but does fine slow and flat on 2lpm  Cough: improved/ min mucoid no disturbing sleep  Sleeping: flat SABA use: minimal use but could not tell med when to start or how often he can use saba if worse (instructions are on action plan of med calendar he forgot to bring)  rec Please schedule a follow up visit in 3 months but call sooner if needed  - bring your medication calendar and drug formulary so we can see if there is a cheaper alternative under your present drug plan  -add: advise that p review of ct  chest he needs to call for any recurrent hemoptysis to arrange f/u    03/01/18 NP eval Doxycycline 100mg  Twice daily  For 1 week , take with food.  Mucinex DM Twice daily  As needed  Cough/congestion  Saline nasal rinses As needed   Salt water gargles As needed   Xyzal daily for 1 week then As needed   Continue on INCRUSE 1 puff daily , rinse after use.  Follow up with Dr. Sherene Sires  As planned and As needed      04/01/2018  f/u ov/Jeremie Giangrande re: GOLD III/ maint incruse as cannot tol laba/ 02 2lpm with ex / no med calendar  Chief Complaint  Patient presents with  . Follow-up    Breathing is about the same. He is using his rescue inhaler about 3 x per wk.   Dyspnea:  MMRC2 = can't walk a nl pace on a flat grade s sob but does fine slow and flat  Cough: occ Sleeping: on side/ bed is flat/ 2 pillows SABA use: as above 02: 2lpm prn  rec Plan A = Automatic = Bevespi Take 2 puffs first thing in am and then another 2 puffs about 12 hours later.  Work on inhaler technique:    Plan B = Backup Only  use your albuterol inhaler/ventolin  as a rescue medication See Tammy NP w/in 3 months  With your drug formulary  all your medication   12/17/2018  f/u ov/Lashana Spang re: GOLD III maint on anoro but having more "throat congestion" Chief Complaint  Patient presents with  . Follow-up    Pt states feels like sputum in the throat, not coming up. He has also noticed some wheezing. He is using his albuterol inhaler 2-3 x per wk on average.   Dyspnea:  Uses [employer] parking, uses [employer] and able to do an aisle or two then stops not using  02 or monitoring sats  Cough: feels like something stuck in throat Sleeping: on side/ bed is flat / 2 pillows SABA use:  Few times a week 02: prn 2 lpm portable pulse not typically using  Elavil hs also x years denies mouth dry rec Plan A = Automatic = Always=   Either Anoro or  Bevespi Take 2 puffs first thing in am and then another 2 puffs about 12 hours later.  Work on inhaler technique: Plan B = Backup (to supplement plan A, not to replace it) Only use your albuterol (ventolin)  inhaler Reminded: Make sure you check your oxygen saturations at highest level of activity to be sure it stays over 90% and adjust upward to maintain this level if needed but remember to turn it back to previous settings when you stop (to conserve your supply).    05/20/2019  f/u ov/Harden Bramer re: GOLD III/ 02 prn on bevespi Chief Complaint  Patient presents with  . Follow-up    Breathing has been worse off and on over the past month. He has noticed some wheezing. He is using his rescue inhaler 3 x per wk.   Dyspnea:  MMRC3 = can't walk 100 yards even at a slow pace at a flat grade s stopping due to sob  = can't finish [employer] s stopping / assoc wheeze Cough: none Sleeping: ok flat/ 2 pillows  SABA use: as above  02: just prn  rec Plan A = Automatic = Always=    Change to breztri Take 2 puffs first thing in am and then another 2 puffs about 12  hours later.  Plan B = Backup (to supplement plan  A, not to replace it) Only use your albuterol inhaler as a rescue medication Make sure you check your oxygen saturations at highest level of activity to be sure it stays over 90% and adjust upward to maintain this level if needed   07/28/2019  f/u ov/Stevin Bielinski re: GOLD  III/ maint on breztri/ min saba/ 02 prn  Chief Complaint  Patient presents with  . Follow-up    Had some congestion, allergies and SOB on exertion   Dyspnea:  [employer] walking slt better  Cough: mostly just nasal symptoms/not blowing out thru nose  Sleeping:  bed is flat / 2 pillows no resp symptoms SABA use:  Not needing as much 02: just prn -    No obvious day to day or daytime variability or assoc excess/ purulent sputum or mucus plugs or hemoptysis or cp or chest tightness, subjective wheeze or overt  b symptoms.   Sleeping as above without nocturnal  or early am exacerbation  of respiratory  c/o's or need for noct saba. Also denies any obvious fluctuation of symptoms with weather or environmental changes or other aggravating or alleviating factors except as outlined above   No unusual exposure hx or h/o childhood pna/ asthma or knowledge of premature birth.  Current Allergies, Complete Past Medical History, Past Surgical History, Family History, and Social History were reviewed in [employer] record.  ROS  The following are not active complaints unless bolded Hoarseness, sore throat, dysphagia, dental problems, itching, sneezing,  nasal congestion or discharge of excess mucus or purulent secretions, ear ache,   fever, chills, sweats, unintended wt loss or wt gain, classically pleuritic or exertional cp,  orthopnea pnd or arm/hand swelling  or leg swelling, presyncope, palpitations, abdominal pain, anorexia, nausea, vomiting, diarrhea  or change in bowel habits or change in bladder habits, change in stools or change in urine, dysuria, hematuria,  rash, arthralgias, visual complaints, headache, numbness,  weakness or ataxia or problems with walking or coordination,  change in mood or  memory.        Current Meds  Medication Sig  . albuterol (VENTOLIN HFA) 108 (90 Base) MCG/ACT inhaler INHALE USING 2 PUFFS EVERY 4 HOURS AS NEEDED ONLY IF YOUR CAN'T CATCH YOUR BREATH  . amitriptyline (ELAVIL) 25 MG tablet TAKE 1 TABLET (25 MG TOTAL) BY MOUTH AT BEDTIME AS NEEDED FOR SLEEP.  Marland Kitchen bisoprolol-hydrochlorothiazide (ZIAC) 5-6.25 MG tablet Take 1 tablet by mouth daily.  . Budeson-Glycopyrrol-Formoterol (BREZTRI AEROSPHERE) 160-9-4.8 MCG/ACT AERO Inhale 2 puffs into the lungs 2 (two) times daily.  . famotidine (PEPCID) 40 MG tablet Take 1 tablet (40 mg total) by mouth 2 (two) times daily.  Marland Kitchen HYDROcodone-acetaminophen (NORCO/VICODIN) 5-325 MG tablet Take 1 tablet by mouth every 8 (eight) hours as needed for moderate pain.  Marland Kitchen lansoprazole (PREVACID) 30 MG capsule TAKE 1 CAPSULE (30 MG TOTAL) BY MOUTH 2 (TWO) TIMES DAILY.  Marland Kitchen levocetirizine (XYZAL) 5 MG tablet TAKE 1 TABLET BY MOUTH EVERY DAY AS NEEDED FOR DRIPPY NOSE  . nystatin (MYCOSTATIN) 100000 UNIT/ML suspension Take 10 mLs (1,000,000 Units total) by mouth 2 (two) times daily.  . OXYGEN 2lpm with exertion only  . polyethylene glycol (MIRALAX / GLYCOLAX) 17 g packet Take 17 g by mouth daily as needed.  . pravastatin (PRAVACHOL) 40 MG tablet Take 1 tablet (40 mg total) by mouth at bedtime.  . Triamcinolone Acetonide (NASACORT ALLERGY 24HR NA) Place 1 spray into the  nose at bedtime as needed.                          Objective:   Physical Exam   07/28/2019        179   05/20/2019       178   12/17/2018      182  04/01/2018        182  12/28/2017      176  08/27/2017        176  06/22/2017        177  05/22/2017        183  04/19/2017        179      07/31/2016        182  05/16/2016        181  04/28/2016        185  01/10/2016        179    11/19/15 182 lb (82.6 kg)  11/11/15 180 lb (81.6 kg)  11/04/15 181 lb 3.5 oz (82.2 kg)     Vital  signs reviewed  07/28/2019  - Note at rest 02 sats  94% on RA        HEENT : pt wearing mask not removed for exam due to covid -19 concerns.    NECK :  without JVD/Nodes/TM/ nl carotid upstrokes bilaterally   LUNGS: no acc muscle use,  Mod barrel  contour chest wall with bilateral  Distant bs s audible wheeze and  without cough on insp or exp maneuvers and mod  Hyperresonant  to  percussion bilaterally     CV:  RRR  no s3 or murmur or increase in P2, and no edema   ABD:  soft and nontender with pos mid insp Hoover's  in the supine position. No bruits or organomegaly appreciated, bowel sounds nl  MS:     ext warm without deformities, calf tenderness, cyanosis or clubbing No obvious joint restrictions   SKIN: warm and dry without lesions    NEURO:  alert, approp, nl sensorium with  no motor or cerebellar deficits apparent.

## 2019-07-28 NOTE — Assessment & Plan Note (Addendum)
Quit smoking 2013 - Spirometry  03/01/12  FEV1  1.63 - Spirometry   09/05/13 FEV1  1.34 (46%) ratio 44  - PFTs   10/29/2013    FEV1    1.70 (62%) ratio 50% no better after ssaba and DLCO 48%  - 09/05/2013  Walked RA x 3 laps @ fast pace @  185 ft each stopped due to end of study, fast pace,sat 89% at end  - 01/06/14 gradual worsening on anoro plus increase need for saba  - 01/28/2014   > try spiriva respimat/ symbicort 160 2bid > changed to symbicort 160 2bid only on 01/28/14  - 02/25/2014   90%   > changed to symbicort 80 2bid since hoarse on 160 and clear on exam  - 11/19/2015 rechallenge with symbicort 160 Take 2 puffs first thing in am and then another 2 puffs about 12 hours later.  - PFT's  01/10/2016  FEV1 1.38 (51 % ) ratio 47  p 1 % improvement from saba p symb 160  prior to study with DLCO  43/41  % corrects to 47  % for alv volume   - 02/21/2017  After extensive coaching inhaler device  effectiveness =    90% vs baseline < 25%  - 02/21/2017 changed to bevespi 2 bid  - 04/19/2017  Demonstrated elipta > try anoro to see if cost less/ works as well  - PFT's  05/22/2017  FEV1 1.21  (46 % ) ratio 46  p 1 % improvement from saba p anoro and albuterol 1 h prior to study with DLCO  38 % corrects to 49 % for alv volume   - changed to Incruse 06/04/17 due to tremors > resolved  - 04/01/2018    try change to bevespi 1-2 bid (one bid if too tremulous from 2)  - 10/21/2018 changed to anoro > globus sensation reported 12/17/2018  -12/17/2018   bevespi to see if throat symptoms improve  - 05/20/2019  After extensive coaching inhaler device,  effectiveness =  90% > changed to breztri   Group D in terms of symptom/risk and laba/lama/ICS  therefore appropriate rx at this point >>>  breztri 2 bid  And advised againt  to blow out thru nose to see if helps nasal symtpoms  I spent extra time with pt today reviewing appropriate use of albuterol for prn use on exertion with the following points: 1) saba is for relief of  sob that does not improve by walking a slower pace or resting but rather if the pt does not improve after trying this first. 2) If the pt is convinced, as many are, that saba helps recover from activity faster then it's easy to tell if this is the case by re-challenging : ie stop, take the inhaler, then p 5 minutes try the exact same activity (intensity of workload) that just caused the symptoms and see if they are substantially diminished or not after saba 3) if there is an activity that reproducibly causes the symptoms, try the saba 15 min before the activity on alternate days   If in fact the saba really does help, then fine to continue to use it prn but advised may need to look closer at the maintenance regimen being used to achieve better control of airways disease with exertion.    >>> f/ q 6 m sooner prn

## 2019-07-29 ENCOUNTER — Encounter: Payer: Self-pay | Admitting: Internal Medicine

## 2019-08-07 ENCOUNTER — Encounter: Payer: Self-pay | Admitting: Gastroenterology

## 2019-10-15 ENCOUNTER — Encounter: Payer: Medicare Other | Admitting: Gastroenterology

## 2019-10-15 ENCOUNTER — Telehealth: Payer: Self-pay | Admitting: Internal Medicine

## 2019-10-15 NOTE — Telephone Encounter (Signed)
Spoke with patient regarding prior message.Advised patient I was able to give him 2 samples of Breztri and will leave them upfront for patient to pick up when our office opens up. Patient's voice was understanding. Nothing  Else further needed.

## 2019-10-18 DIAGNOSIS — R062 Wheezing: Secondary | ICD-10-CM | POA: Diagnosis not present

## 2019-10-18 DIAGNOSIS — J449 Chronic obstructive pulmonary disease, unspecified: Secondary | ICD-10-CM | POA: Diagnosis not present

## 2019-10-24 ENCOUNTER — Ambulatory Visit: Payer: Medicare Other | Admitting: Physician Assistant

## 2019-10-28 DIAGNOSIS — I10 Essential (primary) hypertension: Secondary | ICD-10-CM | POA: Diagnosis not present

## 2019-10-28 DIAGNOSIS — H6692 Otitis media, unspecified, left ear: Secondary | ICD-10-CM | POA: Diagnosis not present

## 2019-10-28 DIAGNOSIS — R0981 Nasal congestion: Secondary | ICD-10-CM | POA: Diagnosis not present

## 2019-10-28 DIAGNOSIS — I2510 Atherosclerotic heart disease of native coronary artery without angina pectoris: Secondary | ICD-10-CM | POA: Diagnosis not present

## 2019-10-28 DIAGNOSIS — B379 Candidiasis, unspecified: Secondary | ICD-10-CM | POA: Diagnosis not present

## 2019-11-17 DIAGNOSIS — R062 Wheezing: Secondary | ICD-10-CM | POA: Diagnosis not present

## 2019-11-17 DIAGNOSIS — J449 Chronic obstructive pulmonary disease, unspecified: Secondary | ICD-10-CM | POA: Diagnosis not present

## 2019-11-19 ENCOUNTER — Ambulatory Visit: Payer: Medicare Other | Admitting: Internal Medicine

## 2019-11-19 DIAGNOSIS — K219 Gastro-esophageal reflux disease without esophagitis: Secondary | ICD-10-CM | POA: Diagnosis not present

## 2019-11-19 DIAGNOSIS — Z86010 Personal history of colonic polyps: Secondary | ICD-10-CM | POA: Diagnosis not present

## 2019-11-19 DIAGNOSIS — K5900 Constipation, unspecified: Secondary | ICD-10-CM | POA: Diagnosis not present

## 2019-11-19 DIAGNOSIS — K625 Hemorrhage of anus and rectum: Secondary | ICD-10-CM | POA: Diagnosis not present

## 2019-11-26 ENCOUNTER — Ambulatory Visit (INDEPENDENT_AMBULATORY_CARE_PROVIDER_SITE_OTHER): Payer: Medicare Other | Admitting: Physician Assistant

## 2019-11-26 ENCOUNTER — Encounter: Payer: Self-pay | Admitting: Physician Assistant

## 2019-11-26 VITALS — BP 130/80 | HR 85 | Ht 69.0 in | Wt 178.0 lb

## 2019-11-26 DIAGNOSIS — K625 Hemorrhage of anus and rectum: Secondary | ICD-10-CM

## 2019-11-26 NOTE — Progress Notes (Signed)
Brief GI Note - pt showed for appt , then after pt in room he related that he had just seen Dr Benson Norway , and is scheduled for Colonoscopy 11/4 /21 with Dr Benson Norway.  Advised pt to continue GI care with Dr Benson Norway.  No Charge.

## 2019-12-11 DIAGNOSIS — D123 Benign neoplasm of transverse colon: Secondary | ICD-10-CM | POA: Diagnosis not present

## 2019-12-11 DIAGNOSIS — K5730 Diverticulosis of large intestine without perforation or abscess without bleeding: Secondary | ICD-10-CM | POA: Diagnosis not present

## 2019-12-11 DIAGNOSIS — K2970 Gastritis, unspecified, without bleeding: Secondary | ICD-10-CM | POA: Diagnosis not present

## 2019-12-11 DIAGNOSIS — K31A0 Gastric intestinal metaplasia, unspecified: Secondary | ICD-10-CM | POA: Diagnosis not present

## 2019-12-11 DIAGNOSIS — K319 Disease of stomach and duodenum, unspecified: Secondary | ICD-10-CM | POA: Diagnosis not present

## 2019-12-11 DIAGNOSIS — K3189 Other diseases of stomach and duodenum: Secondary | ICD-10-CM | POA: Diagnosis not present

## 2019-12-11 DIAGNOSIS — R12 Heartburn: Secondary | ICD-10-CM | POA: Diagnosis not present

## 2019-12-11 DIAGNOSIS — D120 Benign neoplasm of cecum: Secondary | ICD-10-CM | POA: Diagnosis not present

## 2019-12-11 DIAGNOSIS — K219 Gastro-esophageal reflux disease without esophagitis: Secondary | ICD-10-CM | POA: Diagnosis not present

## 2019-12-11 DIAGNOSIS — K635 Polyp of colon: Secondary | ICD-10-CM | POA: Diagnosis not present

## 2019-12-11 DIAGNOSIS — Z1211 Encounter for screening for malignant neoplasm of colon: Secondary | ICD-10-CM | POA: Diagnosis not present

## 2019-12-18 DIAGNOSIS — J449 Chronic obstructive pulmonary disease, unspecified: Secondary | ICD-10-CM | POA: Diagnosis not present

## 2019-12-18 DIAGNOSIS — R062 Wheezing: Secondary | ICD-10-CM | POA: Diagnosis not present

## 2019-12-23 ENCOUNTER — Encounter (HOSPITAL_COMMUNITY): Payer: Self-pay

## 2019-12-23 ENCOUNTER — Inpatient Hospital Stay: Payer: Medicare Other | Attending: Internal Medicine

## 2019-12-23 ENCOUNTER — Ambulatory Visit (HOSPITAL_COMMUNITY)
Admission: RE | Admit: 2019-12-23 | Discharge: 2019-12-23 | Disposition: A | Discharge status: Home / Self Care | Payer: Medicare Other | Source: Ambulatory Visit | Attending: Internal Medicine | Admitting: Internal Medicine

## 2019-12-23 ENCOUNTER — Other Ambulatory Visit: Payer: Self-pay

## 2019-12-23 DIAGNOSIS — J449 Chronic obstructive pulmonary disease, unspecified: Secondary | ICD-10-CM | POA: Insufficient documentation

## 2019-12-23 DIAGNOSIS — Z85118 Personal history of other malignant neoplasm of bronchus and lung: Secondary | ICD-10-CM | POA: Diagnosis not present

## 2019-12-23 DIAGNOSIS — C3490 Malignant neoplasm of unspecified part of unspecified bronchus or lung: Secondary | ICD-10-CM | POA: Diagnosis not present

## 2019-12-23 DIAGNOSIS — Z902 Acquired absence of lung [part of]: Secondary | ICD-10-CM | POA: Diagnosis not present

## 2019-12-23 DIAGNOSIS — Z87891 Personal history of nicotine dependence: Secondary | ICD-10-CM | POA: Insufficient documentation

## 2019-12-23 DIAGNOSIS — J432 Centrilobular emphysema: Secondary | ICD-10-CM | POA: Diagnosis not present

## 2019-12-23 LAB — CMP (CANCER CENTER ONLY)
ALT: 23 U/L (ref 0–44)
AST: 23 U/L (ref 15–41)
Albumin: 4.3 g/dL (ref 3.5–5.0)
Alkaline Phosphatase: 121 U/L (ref 38–126)
Anion gap: 8 (ref 5–15)
BUN: 7 mg/dL — ABNORMAL LOW (ref 8–23)
CO2: 31 mmol/L (ref 22–32)
Calcium: 9.7 mg/dL (ref 8.9–10.3)
Chloride: 101 mmol/L (ref 98–111)
Creatinine: 1.12 mg/dL (ref 0.61–1.24)
GFR, Estimated: >60 mL/min (ref >60)
Glucose, Bld: 97 mg/dL (ref 70–99)
Potassium: 3.7 mmol/L (ref 3.5–5.1)
Sodium: 140 mmol/L (ref 135–145)
Total Bilirubin: 1 mg/dL (ref 0.3–1.2)
Total Protein: 7.6 g/dL (ref 6.5–8.1)

## 2019-12-23 LAB — CBC WITH DIFFERENTIAL (CANCER CENTER ONLY)
Abs Immature Granulocytes: 0.02 10*3/uL (ref 0.00–0.07)
Basophils Absolute: 0 10*3/uL (ref 0.0–0.1)
Basophils Relative: 0 %
Eosinophils Absolute: 0.1 10*3/uL (ref 0.0–0.5)
Eosinophils Relative: 1 %
HCT: 52.1 % — ABNORMAL HIGH (ref 39.0–52.0)
Hemoglobin: 17.4 g/dL — ABNORMAL HIGH (ref 13.0–17.0)
Immature Granulocytes: 0 %
Lymphocytes Relative: 23 %
Lymphs Abs: 1.9 10*3/uL (ref 0.7–4.0)
MCH: 29.7 pg (ref 26.0–34.0)
MCHC: 33.4 g/dL (ref 30.0–36.0)
MCV: 89.1 fL (ref 80.0–100.0)
Monocytes Absolute: 0.6 10*3/uL (ref 0.1–1.0)
Monocytes Relative: 7 %
Neutro Abs: 5.6 10*3/uL (ref 1.7–7.7)
Neutrophils Relative %: 69 %
Platelet Count: 234 10*3/uL (ref 150–400)
RBC: 5.85 MIL/uL — ABNORMAL HIGH (ref 4.22–5.81)
RDW: 13 % (ref 11.5–15.5)
WBC Count: 8.3 10*3/uL (ref 4.0–10.5)
nRBC: 0 % (ref 0.0–0.2)

## 2019-12-23 MED ORDER — IOHEXOL 300 MG/ML  SOLN
75.0000 mL | Freq: Once | INTRAMUSCULAR | Status: AC | PRN
  Administered 2019-12-23: 75 mL via INTRAVENOUS

## 2019-12-25 ENCOUNTER — Other Ambulatory Visit: Payer: Self-pay

## 2019-12-25 ENCOUNTER — Inpatient Hospital Stay (HOSPITAL_BASED_OUTPATIENT_CLINIC_OR_DEPARTMENT_OTHER): Payer: Medicare Other | Admitting: Internal Medicine

## 2019-12-25 ENCOUNTER — Encounter: Payer: Self-pay | Admitting: Internal Medicine

## 2019-12-25 ENCOUNTER — Telehealth: Payer: Self-pay | Admitting: Internal Medicine

## 2019-12-25 VITALS — BP 156/87 | HR 83 | Temp 97.8°F | Resp 20 | Ht 69.0 in | Wt 180.9 lb

## 2019-12-25 DIAGNOSIS — J449 Chronic obstructive pulmonary disease, unspecified: Secondary | ICD-10-CM | POA: Diagnosis not present

## 2019-12-25 DIAGNOSIS — C3490 Malignant neoplasm of unspecified part of unspecified bronchus or lung: Secondary | ICD-10-CM

## 2019-12-25 DIAGNOSIS — Z85118 Personal history of other malignant neoplasm of bronchus and lung: Secondary | ICD-10-CM | POA: Diagnosis not present

## 2019-12-25 DIAGNOSIS — C3491 Malignant neoplasm of unspecified part of right bronchus or lung: Secondary | ICD-10-CM

## 2019-12-25 DIAGNOSIS — Z87891 Personal history of nicotine dependence: Secondary | ICD-10-CM | POA: Diagnosis not present

## 2019-12-25 DIAGNOSIS — I10 Essential (primary) hypertension: Secondary | ICD-10-CM

## 2019-12-25 DIAGNOSIS — Z902 Acquired absence of lung [part of]: Secondary | ICD-10-CM | POA: Diagnosis not present

## 2019-12-25 NOTE — Progress Notes (Signed)
[facility] [phone]   [phone]  OFFICE PROGRESS NOTE  [name], MD [place] [address]  DIAGNOSIS: Stage IA (T1a, N0, M0) non-small cell lung cancer, moderately differentiated squamous cell carcinoma presented with right lower lobe lung nodule.   PRIOR THERAPY: Status post wedge resection of the right lower lobe in April 2017 under the care of Dr. Roxan Hockey.  CURRENT THERAPY: Observation.  INTERVAL HISTORY: Elijah Marshall 69 y.o. male returns to the clinic today for annual follow-up visit.  The patient is feeling fine today with no concerning complaints except for shortness of breath with exertion secondary to COPD.  He denied having any chest pain, cough or hemoptysis.  He denied having any nausea, vomiting, diarrhea or constipation.  He has no headache or visual changes.  He had a colonoscopy performed recently that showed few nonmalignant polyps.  The patient had repeat CT scan of the chest performed recently and is here for evaluation and discussion of his discuss results.  MEDICAL HISTORY: Past Medical History:  Diagnosis Date  . Allergy   . Arthritis   . BPH with obstruction/lower urinary tract symptoms 2016   Nocturia  . Cancer [facility])    unsure at this time  . Cancer of lower lobe of right lung ([place])   . COPD (chronic obstructive pulmonary disease) ([place])    per patient's health survey - he put a ?  . Depression   . Erectile dysfunction due to arterial insufficiency   . GERD (gastroesophageal reflux disease)   . Hyperlipidemia   . Hypertension   . Non-small cell carcinoma of lung, right ([place]) 06/24/2015  . Shortness of breath   . Ulcer     ALLERGIES:  is allergic to aspirin and morphine and related.  MEDICATIONS:  Current Outpatient Medications  Medication Sig Dispense Refill  . albuterol (VENTOLIN HFA) 108 (90 Base) MCG/ACT inhaler INHALE USING 2 PUFFS EVERY 4 HOURS AS NEEDED ONLY IF YOUR CAN'T  CATCH YOUR BREATH 18 g 1  . amitriptyline (ELAVIL) 25 MG tablet TAKE 1 TABLET (25 MG TOTAL) BY MOUTH AT BEDTIME AS NEEDED FOR SLEEP. 90 tablet 0  . atorvastatin (LIPITOR) 40 MG tablet Take 40 mg by mouth at bedtime.    . bisoprolol-hydrochlorothiazide (ZIAC) 5-6.25 MG tablet Take 1 tablet by mouth daily. 90 tablet 1  . Budeson-Glycopyrrol-Formoterol (BREZTRI AEROSPHERE) 160-9-4.8 MCG/ACT AERO Inhale 2 puffs into the lungs 2 (two) times daily. 10.7 g 11  . famotidine (PEPCID) 40 MG tablet Take 1 tablet (40 mg total) by mouth 2 (two) times daily. 180 tablet 3  . HYDROcodone-acetaminophen (NORCO/VICODIN) 5-325 MG tablet Take 1 tablet by mouth every 8 (eight) hours as needed for moderate pain. 90 tablet 0  . lansoprazole (PREVACID) 30 MG capsule TAKE 1 CAPSULE (30 MG TOTAL) BY MOUTH 2 (TWO) TIMES DAILY. 180 capsule 1  . levocetirizine (XYZAL) 5 MG tablet TAKE 1 TABLET BY MOUTH EVERY DAY AS NEEDED FOR DRIPPY NOSE 90 tablet 1  . nystatin (MYCOSTATIN) 100000 UNIT/ML suspension Take 10 mLs (1,000,000 Units total) by mouth 2 (two) times daily. 140 mL 0  . OXYGEN 2lpm with exertion only    . polyethylene glycol (MIRALAX / GLYCOLAX) 17 g packet Take 17 g by mouth daily as needed.    . Triamcinolone Acetonide (NASACORT ALLERGY 24HR NA) Place 1 spray into the nose at bedtime as needed.     No current facility-administered medications for this  visit.    SURGICAL HISTORY:  Past Surgical History:  Procedure Laterality Date  . ANTERIOR FUSION LUMBAR SPINE  07/25/2012  . ANTERIOR LAT LUMBAR FUSION Left 07/25/2012   Procedure: ANTERIOR LATERAL LUMBAR FUSION 1 LEVEL;  Surgeon: [name], MD;  Location: [facility];  Service: Orthopedics;  Laterality: Left;  Left sided lumbar 3-4 lateral interbody fusion with allograft and instrumentation  . COLONOSCOPY  14   polyps rem  . JOINT REPLACEMENT Right 2010  . NODE DISSECTION Right 05/24/2015   Procedure: NODE DISSECTION;  Surgeon: [name], MD;   Location: [facility];  Service: Thoracic;  Laterality: Right;  . SEGMENTECOMY Right 05/24/2015   Procedure: RIGHT LOWER LOBE SUPERIOR SEGMENTECTOMY;  Surgeon: [name], MD;  Location: [facility];  Service: Thoracic;  Laterality: Right;  . [place]  2012  . VIDEO ASSISTED THORACOSCOPY (VATS)/WEDGE RESECTION Right 05/24/2015   Procedure: RIGHT VIDEO ASSISTED THORACOSCOPY (VATS)/ RIGHTR LOWER LOBE WEDGE RESECTION;  Surgeon: [name], MD;  Location: [facility];  Service: Thoracic;  Laterality: Right;    REVIEW OF SYSTEMS:  A comprehensive review of systems was negative except for: Respiratory: positive for dyspnea on exertion   PHYSICAL EXAMINATION: General appearance: alert, cooperative and no distress Head: Normocephalic, without obvious abnormality, atraumatic Neck: no adenopathy, no JVD, supple, symmetrical, trachea midline and thyroid not enlarged, symmetric, no tenderness/mass/nodules Lymph nodes: Cervical, supraclavicular, and axillary nodes normal. Resp: clear to auscultation bilaterally Back: symmetric, no curvature. ROM normal. No CVA tenderness. Cardio: regular rate and rhythm, S1, S2 normal, no murmur, click, rub or gallop GI: soft, non-tender; bowel sounds normal; no masses,  no organomegaly Extremities: extremities normal, atraumatic, no cyanosis or edema  ECOG PERFORMANCE STATUS: 1 - Symptomatic but completely ambulatory  Blood pressure (!) 156/87, pulse 83, temperature 97.8 F (36.6 C), temperature source Tympanic, resp. rate 20, height 5\' 9"  (1.753 m), weight 180 lb 14.4 oz (82.1 kg), SpO2 98 %.  LABORATORY DATA: Lab Results  Component Value Date   WBC 8.3 12/23/2019   HGB 17.4 (H) 12/23/2019   HCT 52.1 (H) 12/23/2019   MCV 89.1 12/23/2019   PLT 234 12/23/2019      Chemistry      Component Value Date/Time   NA 140 12/23/2019 1013   NA 141 12/25/2016 1010   K 3.7 12/23/2019 1013   K 3.6 12/25/2016 1010   CL 101 12/23/2019 1013   CO2 31 12/23/2019  1013   CO2 28 12/25/2016 1010   BUN 7 (L) 12/23/2019 1013   BUN 6.1 (L) 12/25/2016 1010   CREATININE 1.12 12/23/2019 1013   CREATININE 1.1 12/25/2016 1010      Component Value Date/Time   CALCIUM 9.7 12/23/2019 1013   CALCIUM 9.6 12/25/2016 1010   ALKPHOS 121 12/23/2019 1013   ALKPHOS 107 12/25/2016 1010   AST 23 12/23/2019 1013   AST 19 12/25/2016 1010   ALT 23 12/23/2019 1013   ALT 15 12/25/2016 1010   BILITOT 1.0 12/23/2019 1013   BILITOT 0.56 12/25/2016 1010       RADIOGRAPHIC STUDIES: CT Chest W Contrast  Result Date: 12/23/2019 CLINICAL DATA:  Non-small cell lung cancer staging EXAM: CT CHEST WITH CONTRAST TECHNIQUE: Multidetector CT imaging of the chest was performed during intravenous contrast administration. CONTRAST:  65mL OMNIPAQUE IOHEXOL 300 MG/ML  SOLN COMPARISON:  12/20/2018 FINDINGS: Cardiovascular: No significant vascular findings. Normal heart size. No pericardial effusion. Mediastinum/Nodes: No enlarged mediastinal, hilar, or axillary lymph nodes. Frothy debris  in the lower trachea. The thyroid and esophagus demonstrate no significant findings. Lungs/Pleura: Severe centrilobular emphysema. Postoperative findings of right lower lobe superior segmentectomy and wedge resection. No pleural effusion or pneumothorax. Upper Abdomen: No acute abnormality.  Hepatic steatosis. Musculoskeletal: No chest wall mass or suspicious bone lesions identified. IMPRESSION: 1. Postoperative findings of right lower lobe superior segmentectomy and wedge resection. No evidence of recurrent or metastatic disease in the chest. 2. Severe centrilobular emphysema. 3. Hepatic steatosis. Emphysema (ICD10-J43.9). Electronically Signed   By: Eddie Candle M.D.   On: 12/23/2019 13:49    ASSESSMENT AND PLAN:  This is a very pleasant 69 years old African-American male with a stage IA non-small cell lung cancer status post wedge resection of the right lower lobe in 2017. The patient is currently on  observation and he is feeling fine today with no concerning complaints. He had repeat CT scan of the chest performed recently.  I personally and independently reviewed the scan and discussed the results with the patient today. His scan showed no concerning findings for disease recurrence or metastasis. I recommended for him to continue on observation with repeat CT scan of the chest in 1 year. The patient was advised to call immediately if he has any concerning symptoms in the interval. All questions were answered. The patient knows to call the clinic with any problems, questions or concerns. We can certainly see the patient much sooner if necessary.   Disclaimer: This note was dictated with voice recognition software. Similar sounding words can inadvertently be transcribed and may not be corrected upon review.

## 2019-12-25 NOTE — Telephone Encounter (Signed)
Scheduled appointments per 11/18 los. Spoke to patient who is aware of appointment date and time. Gave patient calendar print out.

## 2019-12-26 DIAGNOSIS — R7303 Prediabetes: Secondary | ICD-10-CM | POA: Diagnosis not present

## 2019-12-26 DIAGNOSIS — E7800 Pure hypercholesterolemia, unspecified: Secondary | ICD-10-CM | POA: Diagnosis not present

## 2019-12-26 DIAGNOSIS — I10 Essential (primary) hypertension: Secondary | ICD-10-CM | POA: Diagnosis not present

## 2019-12-26 DIAGNOSIS — J449 Chronic obstructive pulmonary disease, unspecified: Secondary | ICD-10-CM | POA: Diagnosis not present

## 2020-01-09 ENCOUNTER — Telehealth: Payer: Self-pay | Admitting: Internal Medicine

## 2020-01-09 MED ORDER — ALBUTEROL SULFATE HFA 108 (90 BASE) MCG/ACT IN AERS
1.0000 | INHALATION_SPRAY | RESPIRATORY_TRACT | 5 refills | Status: DC | PRN
Start: 2020-01-09 — End: 2020-03-17

## 2020-01-09 NOTE — Telephone Encounter (Signed)
Pt requesting albuterol refill.  This has been sent to pharmacy.  Nothing further needed at this time- will close encounter.

## 2020-01-15 DIAGNOSIS — K2950 Unspecified chronic gastritis without bleeding: Secondary | ICD-10-CM | POA: Diagnosis not present

## 2020-01-15 DIAGNOSIS — K259 Gastric ulcer, unspecified as acute or chronic, without hemorrhage or perforation: Secondary | ICD-10-CM | POA: Diagnosis not present

## 2020-01-15 DIAGNOSIS — K319 Disease of stomach and duodenum, unspecified: Secondary | ICD-10-CM | POA: Diagnosis not present

## 2020-01-15 DIAGNOSIS — K31A11 Gastric intestinal metaplasia without dysplasia, involving the antrum: Secondary | ICD-10-CM | POA: Diagnosis not present

## 2020-01-15 DIAGNOSIS — K3189 Other diseases of stomach and duodenum: Secondary | ICD-10-CM | POA: Diagnosis not present

## 2020-01-15 DIAGNOSIS — K31A12 Gastric intestinal metaplasia without dysplasia, involving the body (corpus): Secondary | ICD-10-CM | POA: Diagnosis not present

## 2020-01-15 DIAGNOSIS — K2970 Gastritis, unspecified, without bleeding: Secondary | ICD-10-CM | POA: Diagnosis not present

## 2020-01-17 DIAGNOSIS — R062 Wheezing: Secondary | ICD-10-CM | POA: Diagnosis not present

## 2020-01-17 DIAGNOSIS — J449 Chronic obstructive pulmonary disease, unspecified: Secondary | ICD-10-CM | POA: Diagnosis not present

## 2020-01-19 ENCOUNTER — Telehealth: Payer: Self-pay | Admitting: Internal Medicine

## 2020-01-19 MED ORDER — BREZTRI AEROSPHERE 160-9-4.8 MCG/ACT IN AERO: 2.0000 | INHALATION_SPRAY | Freq: Two times a day (BID) | RESPIRATORY_TRACT | 0 refills | Status: DC

## 2020-01-19 NOTE — Telephone Encounter (Signed)
Samples placed up front for pickup as requested. Pt aware.  Nothing further needed at this time- will close encounter.

## 2020-01-19 NOTE — Telephone Encounter (Signed)
Sorry to hear this.  Can we leave him 2 samples up front and we can discuss med cost in more detail on return visit next month and consider patient assistance if he qualifies   Will route to Dr. Melvyn Novas  And Volanda Napoleon NP for [facility]

## 2020-01-21 DIAGNOSIS — I10 Essential (primary) hypertension: Secondary | ICD-10-CM | POA: Diagnosis not present

## 2020-01-21 DIAGNOSIS — J449 Chronic obstructive pulmonary disease, unspecified: Secondary | ICD-10-CM | POA: Diagnosis not present

## 2020-01-21 DIAGNOSIS — G894 Chronic pain syndrome: Secondary | ICD-10-CM | POA: Diagnosis not present

## 2020-01-21 DIAGNOSIS — E785 Hyperlipidemia, unspecified: Secondary | ICD-10-CM | POA: Diagnosis not present

## 2020-01-21 DIAGNOSIS — Z79891 Long term (current) use of opiate analgesic: Secondary | ICD-10-CM | POA: Diagnosis not present

## 2020-01-21 DIAGNOSIS — R7303 Prediabetes: Secondary | ICD-10-CM | POA: Diagnosis not present

## 2020-01-21 DIAGNOSIS — G8929 Other chronic pain: Secondary | ICD-10-CM | POA: Diagnosis not present

## 2020-01-21 DIAGNOSIS — I2510 Atherosclerotic heart disease of native coronary artery without angina pectoris: Secondary | ICD-10-CM | POA: Diagnosis not present

## 2020-01-27 ENCOUNTER — Ambulatory Visit: Payer: Medicare Other | Admitting: Internal Medicine

## 2020-02-09 ENCOUNTER — Ambulatory Visit: Payer: Medicare Other | Admitting: Internal Medicine

## 2020-02-09 ENCOUNTER — Ambulatory Visit: Payer: Medicare Other | Admitting: Primary Care

## 2020-02-10 ENCOUNTER — Ambulatory Visit: Payer: Medicare Other | Admitting: Internal Medicine

## 2020-02-16 ENCOUNTER — Ambulatory Visit: Payer: Medicare Other | Admitting: Primary Care

## 2020-02-17 DIAGNOSIS — J449 Chronic obstructive pulmonary disease, unspecified: Secondary | ICD-10-CM | POA: Diagnosis not present

## 2020-02-17 DIAGNOSIS — R062 Wheezing: Secondary | ICD-10-CM | POA: Diagnosis not present

## 2020-02-18 ENCOUNTER — Ambulatory Visit: Payer: Medicare Other | Admitting: Primary Care

## 2020-02-18 NOTE — Progress Notes (Deleted)
@Patient  ID: Elijah Marshall, male    DOB: 07-Nov-1950, 70 y.o.   MRN: [id number]  No chief complaint on file.   Referring provider: [name], MD  HPI: 70 year old male, former smoker quit 2013 (35 pack year hx). PMH significant for COPD GOLD III, chronic respiratory failture, chronic sinusitis, non-small cell carcinoma of right lung s/p lobectomy, GERD, hyperlipidemia. Patient of Dr. Melvyn Novas, last seen on 07/28/19. Maintained on Breztri two puff twice daily.   02/18/2020 Patient presents today for 6 month follow-up.        Allergies  Allergen Reactions  . Aspirin Nausea And Vomiting  . Morphine And Related Itching    Immunization History  Administered Date(s) Administered  . Fluad Quad(high Dose 65+) 11/07/2018  . Influenza, High Dose Seasonal PF 11/21/2016, 11/16/2017  . Influenza,inj,Quad PF,6+ Mos 01/15/2015  . Influenza-Unspecified 11/02/2015  . PFIZER SARS-COV-2 Vaccination 03/20/2019  . Pneumococcal Conjugate-13 04/17/2017, 09/06/2018  . Pneumococcal Polysaccharide-23 07/26/2012  . Tdap 09/06/2018  . Zoster Recombinat (Shingrix) 09/06/2018, 11/07/2018    Past Medical History:  Diagnosis Date  . Allergy   . Arthritis   . BPH with obstruction/lower urinary tract symptoms 2016   Nocturia  . Cancer [facility])    unsure at this time  . Cancer of lower lobe of right lung ([place])   . COPD (chronic obstructive pulmonary disease) ([place])    per patient's health survey - he put a ?  . Depression   . Erectile dysfunction due to arterial insufficiency   . GERD (gastroesophageal reflux disease)   . Hyperlipidemia   . Hypertension   . Non-small cell carcinoma of lung, right ([place]) 06/24/2015  . Shortness of breath   . Ulcer     Tobacco History: Social History   Tobacco Use  Smoking Status Former Smoker  . Packs/day: 1.00  . Years: 35.00  . Pack years: 35.00  . Types: Cigarettes  . Quit date: 09/24/2011  . Years since quitting: 8.4  Smokeless Tobacco Never Used    Counseling given: Not Answered   Outpatient Medications Prior to Visit  Medication Sig Dispense Refill  . albuterol (VENTOLIN HFA) 108 (90 Base) MCG/ACT inhaler Inhale 1-2 puffs into the lungs every 4 (four) hours as needed for wheezing or shortness of breath. 18 g 5  . amitriptyline (ELAVIL) 25 MG tablet TAKE 1 TABLET (25 MG TOTAL) BY MOUTH AT BEDTIME AS NEEDED FOR SLEEP. 90 tablet 0  . atorvastatin (LIPITOR) 40 MG tablet Take 40 mg by mouth at bedtime.    . bisoprolol-hydrochlorothiazide (ZIAC) 5-6.25 MG tablet Take 1 tablet by mouth daily. 90 tablet 1  . Budeson-Glycopyrrol-Formoterol (BREZTRI AEROSPHERE) 160-9-4.8 MCG/ACT AERO Inhale 2 puffs into the lungs 2 (two) times daily. 10.7 g 11  . Budeson-Glycopyrrol-Formoterol (BREZTRI AEROSPHERE) 160-9-4.8 MCG/ACT AERO Inhale 2 puffs into the lungs in the morning and at bedtime. 2 g 0  . famotidine (PEPCID) 40 MG tablet Take 1 tablet (40 mg total) by mouth 2 (two) times daily. 180 tablet 3  . HYDROcodone-acetaminophen (NORCO/VICODIN) 5-325 MG tablet Take 1 tablet by mouth every 8 (eight) hours as needed for moderate pain. 90 tablet 0  . lansoprazole (PREVACID) 30 MG capsule TAKE 1 CAPSULE (30 MG TOTAL) BY MOUTH 2 (TWO) TIMES DAILY. 180 capsule 1  . levocetirizine (XYZAL) 5 MG tablet TAKE 1 TABLET BY MOUTH EVERY DAY AS NEEDED FOR DRIPPY NOSE 90 tablet 1  . nystatin (MYCOSTATIN) 100000 UNIT/ML suspension Take 10 mLs (1,000,000 Units total) by  mouth 2 (two) times daily. 140 mL 0  . OXYGEN 2lpm with exertion only    . polyethylene glycol (MIRALAX / GLYCOLAX) 17 g packet Take 17 g by mouth daily as needed.    . Triamcinolone Acetonide (NASACORT ALLERGY 24HR NA) Place 1 spray into the nose at bedtime as needed.     No facility-administered medications prior to visit.      Review of Systems  Review of Systems   Physical Exam  There were no vitals taken for this visit. Physical Exam   Lab Results:  CBC    Component Value Date/Time    WBC 8.3 12/23/2019 1013   WBC 6.6 12/21/2017 0807   RBC 5.85 (H) 12/23/2019 1013   HGB 17.4 (H) 12/23/2019 1013   HGB 16.2 12/25/2016 1010   HCT 52.1 (H) 12/23/2019 1013   HCT 48.6 12/25/2016 1010   PLT 234 12/23/2019 1013   PLT 224 12/25/2016 1010   MCV 89.1 12/23/2019 1013   MCV 88.2 12/25/2016 1010   MCH 29.7 12/23/2019 1013   MCHC 33.4 12/23/2019 1013   RDW 13.0 12/23/2019 1013   RDW 13.6 12/25/2016 1010   LYMPHSABS 1.9 12/23/2019 1013   LYMPHSABS 2.1 12/25/2016 1010   MONOABS 0.6 12/23/2019 1013   MONOABS 0.7 12/25/2016 1010   EOSABS 0.1 12/23/2019 1013   EOSABS 0.1 12/25/2016 1010   BASOSABS 0.0 12/23/2019 1013   BASOSABS 0.0 12/25/2016 1010    BMET    Component Value Date/Time   NA 140 12/23/2019 1013   NA 141 12/25/2016 1010   K 3.7 12/23/2019 1013   K 3.6 12/25/2016 1010   CL 101 12/23/2019 1013   CO2 31 12/23/2019 1013   CO2 28 12/25/2016 1010   GLUCOSE 97 12/23/2019 1013   GLUCOSE 81 12/25/2016 1010   BUN 7 (L) 12/23/2019 1013   BUN 6.1 (L) 12/25/2016 1010   CREATININE 1.12 12/23/2019 1013   CREATININE 1.1 12/25/2016 1010   CALCIUM 9.7 12/23/2019 1013   CALCIUM 9.6 12/25/2016 1010   GFRNONAA >60 12/23/2019 1013   GFRAA >60 12/20/2018 1226    BNP No results found for: BNP  ProBNP No results found for: PROBNP  Imaging: No results found.   Assessment & Plan:   No problem-specific Assessment & Plan notes found for this encounter.     Martyn Ehrich, NP 02/18/2020

## 2020-02-25 ENCOUNTER — Ambulatory Visit: Payer: Medicare Other | Admitting: Primary Care

## 2020-02-25 ENCOUNTER — Other Ambulatory Visit: Payer: Self-pay

## 2020-02-25 ENCOUNTER — Encounter: Payer: Self-pay | Admitting: Primary Care

## 2020-02-25 VITALS — BP 130/82 | HR 104 | Temp 98.6°F | Ht 69.0 in | Wt 176.0 lb

## 2020-02-25 DIAGNOSIS — C3491 Malignant neoplasm of unspecified part of right bronchus or lung: Secondary | ICD-10-CM

## 2020-02-25 DIAGNOSIS — J9611 Chronic respiratory failure with hypoxia: Secondary | ICD-10-CM

## 2020-02-25 DIAGNOSIS — J449 Chronic obstructive pulmonary disease, unspecified: Secondary | ICD-10-CM | POA: Diagnosis not present

## 2020-02-25 MED ORDER — BREZTRI AEROSPHERE 160-9-4.8 MCG/ACT IN AERO: 2.0000 | INHALATION_SPRAY | Freq: Two times a day (BID) | RESPIRATORY_TRACT | 11 refills | Status: DC

## 2020-02-25 NOTE — Patient Instructions (Signed)
COPD: - Continue Breztri two puffs morning and evening - Use albuterol rescue inhaler 2 puffs every 4-6 hours for breakthrough shortness of breath or wheezing - Use 2L oxygen on exertion as needed - Continue to stay as active as possible - If you develop purulent mucus, worsening cough or shortness of breath please notify office  Orders: PFTs in 6 months  Follow-up: 6 months with Dr. Melvyn Novas    COPD and Physical Activity Chronic obstructive pulmonary disease (COPD) is a long-term (chronic) condition that affects the lungs. COPD is a general term that can be used to describe many different lung problems that cause lung swelling (inflammation) and limit airflow, including chronic bronchitis and emphysema. The main symptom of COPD is shortness of breath, which makes it harder to do even simple tasks. This can also make it harder to exercise and be active. Talk with your health care provider about treatments to help you breathe better and actions you can take to prevent breathing problems during physical activity. What are the benefits of exercising with COPD? Exercising regularly is an important part of a healthy lifestyle. You can still exercise and do physical activities even though you have COPD. Exercise and physical activity improve your shortness of breath by increasing blood flow (circulation). This causes your heart to pump more oxygen through your body. Moderate exercise can improve your:  Oxygen use.  Energy level.  Shortness of breath.  Strength in your breathing muscles.  Heart health.  Sleep.  Self-esteem and feelings of self-worth.  Depression, stress, and anxiety levels. Exercise can benefit everyone with COPD. The severity of your disease may affect how hard you can exercise, especially at first, but everyone can benefit. Talk with your health care provider about how much exercise is safe for you, and which activities and exercises are safe for you.   What actions can I  take to prevent breathing problems during physical activity?  Sign up for a pulmonary rehabilitation program. This type of program may include: ? Education about lung diseases. ? Exercise classes that teach you how to exercise and be more active while improving your breathing. This usually involves:  Exercise using your lower extremities, such as a stationary bicycle.  About 30 minutes of exercise, 2 to 5 times per week, for 6 to 12 weeks  Strength training, such as push ups or leg lifts. ? Nutrition education. ? Group classes in which you can talk with others who also have COPD and learn ways to manage stress.  If you use an oxygen tank, you should use it while you exercise. Work with your health care provider to adjust your oxygen for your physical activity. Your resting flow rate is different from your flow rate during physical activity.  While you are exercising: ? Take slow breaths. ? Pace yourself and do not try to go too fast. ? Purse your lips while breathing out. Pursing your lips is similar to a kissing or whistling position. ? If doing exercise that uses a quick burst of effort, such as weight lifting:  Breathe in before starting the exercise.  Breathe out during the hardest part of the exercise (such as raising the weights). Where to find support You can find support for exercising with COPD from:  Your health care provider.  A pulmonary rehabilitation program.  Your local health department or community health programs.  Support groups, online or in-person. Your health care provider may be able to recommend support groups. Where to find more information  You can find more information about exercising with COPD from:  American Lung Association: ClassInsider.se.  COPD Foundation: https://www.rivera.net/. Contact a health care provider if:  Your symptoms get worse.  You have chest pain.  You have nausea.  You have a fever.  You have trouble talking or catching your  breath.  You want to start a new exercise program or a new activity. Summary  COPD is a general term that can be used to describe many different lung problems that cause lung swelling (inflammation) and limit airflow. This includes chronic bronchitis and emphysema.  Exercise and physical activity improve your shortness of breath by increasing blood flow (circulation). This causes your heart to provide more oxygen to your body.  Contact your health care provider before starting any exercise program or new activity. Ask your health care provider what exercises and activities are safe for you. This information is not intended to replace advice given to you by your health care provider. Make sure you discuss any questions you have with your health care provider. Document Revised: 05/15/2018 Document Reviewed: 02/15/2017 Elsevier Patient Education  2021 [employer].

## 2020-02-25 NOTE — Progress Notes (Signed)
@Patient  ID: Elijah Marshall, male    DOB: [birth year], 70 y.o.   MRN: [id number]  Chief Complaint  Patient presents with  . Shortness of Breath    Reports feeling "okay" and that breathing is harder when he's outside in colder weather.      Referring provider: [name], MD  HPI: 70 year old male, former smoker quit 2013 (35 pack year hx). PMH significant for COPD GOLD III, chronic respiratory failture, chronic sinusitis, non-small cell carcinoma of right lung s/p lobectomy, GERD, hyperlipidemia. Patient of Dr. Melvyn Novas, last seen on 07/28/19. Maintained on Breztri and O2 prn.   02/25/2020 Patient presents today for 6 month follow-up. He is doing ok. Continue to use Breztri inhaler twice a day as prescribed. He currently has a cough with clear mucus. States that nasal saline solution helps. He also takes daily antihistamine. He experiences dyspnea when walking up stairs and hills mainly. He rarely requires his supplemental oxygen, uses 2L/min mostly with moderate-heavy exertion. He saw oncology in November 2021, no evidence of disease. Last treated for acute sinusitis in December with Cefdinir.   Dyspnea: With exertion approx 75-124ft/ hills/ stairs  Saba: Once a day  O2: Uses 2L PRN on average three times a month Nocturnal: None  Pulmonary testing: PFT's  05/22/2017  FEV1 1.21  (46 % ) ratio 46  p 1 % improvement from saba p anoro and albuterol 1 h prior to study with DLCO  38 % corrects to 49 % for alv volume    Allergies  Allergen Reactions  . Aspirin Nausea And Vomiting  . Morphine And Related Itching    Immunization History  Administered Date(s) Administered  . Fluad Quad(high Dose 65+) 11/07/2018  . Influenza, High Dose Seasonal PF 11/21/2016, 11/16/2017  . Influenza,inj,Quad PF,6+ Mos 01/15/2015  . Influenza-Unspecified 11/02/2015  . PFIZER(Purple Top)SARS-COV-2 Vaccination 03/20/2019  . Pneumococcal Conjugate-13 04/17/2017, 09/06/2018  . Pneumococcal Polysaccharide-23  07/26/2012  . Tdap 09/06/2018  . Zoster Recombinat (Shingrix) 09/06/2018, 11/07/2018    Past Medical History:  Diagnosis Date  . Allergy   . Arthritis   . BPH with obstruction/lower urinary tract symptoms 2016   Nocturia  . Cancer [facility])    unsure at this time  . Cancer of lower lobe of right lung ([place])   . COPD (chronic obstructive pulmonary disease) ([place])    per patient's health survey - he put a ?  . Depression   . Erectile dysfunction due to arterial insufficiency   . GERD (gastroesophageal reflux disease)   . Hyperlipidemia   . Hypertension   . Non-small cell carcinoma of lung, right ([place]) 06/24/2015  . Shortness of breath   . Ulcer     Tobacco History: Social History   Tobacco Use  Smoking Status Former Smoker  . Packs/day: 1.00  . Years: 35.00  . Pack years: 35.00  . Types: Cigarettes  . Quit date: 09/24/2011  . Years since quitting: 8.4  Smokeless Tobacco Never Used   Counseling given: Not Answered   Outpatient Medications Prior to Visit  Medication Sig Dispense Refill  . albuterol (VENTOLIN HFA) 108 (90 Base) MCG/ACT inhaler Inhale 1-2 puffs into the lungs every 4 (four) hours as needed for wheezing or shortness of breath. 18 g 5  . amitriptyline (ELAVIL) 25 MG tablet TAKE 1 TABLET (25 MG TOTAL) BY MOUTH AT BEDTIME AS NEEDED FOR SLEEP. 90 tablet 0  . atorvastatin (LIPITOR) 40 MG tablet Take 40 mg by mouth at bedtime.    Marland Kitchen  bisoprolol-hydrochlorothiazide (ZIAC) 5-6.25 MG tablet Take 1 tablet by mouth daily. 90 tablet 1  . famotidine (PEPCID) 40 MG tablet Take 1 tablet (40 mg total) by mouth 2 (two) times daily. 180 tablet 3  . HYDROcodone-acetaminophen (NORCO/VICODIN) 5-325 MG tablet Take 1 tablet by mouth every 8 (eight) hours as needed for moderate pain. 90 tablet 0  . lansoprazole (PREVACID) 30 MG capsule TAKE 1 CAPSULE (30 MG TOTAL) BY MOUTH 2 (TWO) TIMES DAILY. 180 capsule 1  . levocetirizine (XYZAL) 5 MG tablet TAKE 1 TABLET BY MOUTH EVERY DAY AS NEEDED  FOR DRIPPY NOSE 90 tablet 1  . nystatin (MYCOSTATIN) 100000 UNIT/ML suspension Take 10 mLs (1,000,000 Units total) by mouth 2 (two) times daily. 140 mL 0  . OXYGEN 2lpm with exertion only    . polyethylene glycol (MIRALAX / GLYCOLAX) 17 g packet Take 17 g by mouth daily as needed.    . Triamcinolone Acetonide (NASACORT ALLERGY 24HR NA) Place 1 spray into the nose at bedtime as needed.    . Budeson-Glycopyrrol-Formoterol (BREZTRI AEROSPHERE) 160-9-4.8 MCG/ACT AERO Inhale 2 puffs into the lungs 2 (two) times daily. 10.7 g 11  . Budeson-Glycopyrrol-Formoterol (BREZTRI AEROSPHERE) 160-9-4.8 MCG/ACT AERO Inhale 2 puffs into the lungs in the morning and at bedtime. 2 g 0   No facility-administered medications prior to visit.   Review of Systems  Review of Systems  Constitutional: Negative.   Respiratory: Positive for cough. Negative for chest tightness and wheezing.        DOE  Cardiovascular: Negative.    Physical Exam  BP 130/82 (BP Location: Right Arm, Cuff Size: Normal)   Pulse (!) 104   Temp 98.6 F (37 C)   Ht 5\' 9"  (1.753 m)   Wt 176 lb (79.8 kg)   SpO2 96%   BMI 25.99 kg/m  Physical Exam Constitutional:      Appearance: Normal appearance. He is well-developed.  HENT:     Head: Normocephalic and atraumatic.     Mouth/Throat:     Mouth: Mucous membranes are moist.     Pharynx: Oropharynx is clear.  Neck:     Comments: Soft tissue swelling ? lipoma right neck  Cardiovascular:     Rate and Rhythm: Regular rhythm. Tachycardia present.     Comments: No edema Pulmonary:     Effort: Pulmonary effort is normal.     Breath sounds: Normal breath sounds. No wheezing, rhonchi or rales.     Comments: CTA  Musculoskeletal:        General: Normal range of motion.     Cervical back: Normal range of motion and neck supple. No tenderness.  Lymphadenopathy:     Cervical: No cervical adenopathy.  Neurological:     General: No focal deficit present.     Mental Status: He is alert and  oriented to person, place, and time. Mental status is at baseline.  Psychiatric:        Mood and Affect: Mood normal.        Behavior: Behavior normal.        Thought Content: Thought content normal.        Judgment: Judgment normal.      Lab Results:  CBC    Component Value Date/Time   WBC 8.3 12/23/2019 1013   WBC 6.6 12/21/2017 0807   RBC 5.85 (H) 12/23/2019 1013   HGB 17.4 (H) 12/23/2019 1013   HGB 16.2 12/25/2016 1010   HCT 52.1 (H) 12/23/2019 1013   HCT 48.6  12/25/2016 1010   PLT 234 12/23/2019 1013   PLT 224 12/25/2016 1010   MCV 89.1 12/23/2019 1013   MCV 88.2 12/25/2016 1010   MCH 29.7 12/23/2019 1013   MCHC 33.4 12/23/2019 1013   RDW 13.0 12/23/2019 1013   RDW 13.6 12/25/2016 1010   LYMPHSABS 1.9 12/23/2019 1013   LYMPHSABS 2.1 12/25/2016 1010   MONOABS 0.6 12/23/2019 1013   MONOABS 0.7 12/25/2016 1010   EOSABS 0.1 12/23/2019 1013   EOSABS 0.1 12/25/2016 1010   BASOSABS 0.0 12/23/2019 1013   BASOSABS 0.0 12/25/2016 1010    BMET    Component Value Date/Time   NA 140 12/23/2019 1013   NA 141 12/25/2016 1010   K 3.7 12/23/2019 1013   K 3.6 12/25/2016 1010   CL 101 12/23/2019 1013   CO2 31 12/23/2019 1013   CO2 28 12/25/2016 1010   GLUCOSE 97 12/23/2019 1013   GLUCOSE 81 12/25/2016 1010   BUN 7 (L) 12/23/2019 1013   BUN 6.1 (L) 12/25/2016 1010   CREATININE 1.12 12/23/2019 1013   CREATININE 1.1 12/25/2016 1010   CALCIUM 9.7 12/23/2019 1013   CALCIUM 9.6 12/25/2016 1010   GFRNONAA >60 12/23/2019 1013   GFRAA >60 12/20/2018 1226    BNP No results found for: BNP  ProBNP No results found for: PROBNP  Imaging: No results found.   Assessment & Plan:   COPD GOLD III - Group D in terms of symptoms. COPD is well compensated on current regimen. Uses SABA once a day on average. He has had not recent exacerbations or hospitalizations.  - Continue Breztri two puffs twice daily; Albuterol hfa 2 puffs every 6 hours as needed for sob/wheezing  - Last  PFTs on file 2019, recommend repeating in 6 months to monitor lung function   Chronic respiratory failure with hypoxia (HCC) - O2 96% RA today at rest  - He rarely requires supplemental oxygen, uses 2L/min with moderate-heavy exertion  Non-small cell carcinoma of lung, right [facility]) - He saw oncology in November 2021, no evidence of disease   Martyn Ehrich, NP 02/27/2020

## 2020-02-27 ENCOUNTER — Encounter: Payer: Self-pay | Admitting: Primary Care

## 2020-02-27 NOTE — Assessment & Plan Note (Addendum)
-   O2 96% RA today at rest  - He rarely requires supplemental oxygen, uses 2L/min with moderate-heavy exertion

## 2020-02-27 NOTE — Assessment & Plan Note (Addendum)
-   Group D in terms of symptoms. COPD is well compensated on current regimen. Uses SABA once a day on average. He has had not recent exacerbations or hospitalizations.  - Continue Breztri two puffs twice daily; Albuterol hfa 2 puffs every 6 hours as needed for sob/wheezing  - Last PFTs on file 2019, recommend repeating in 6 months to monitor lung function

## 2020-02-27 NOTE — Assessment & Plan Note (Signed)
-   He saw oncology in November 2021, no evidence of disease

## 2020-03-09 NOTE — Progress Notes (Signed)
Patient referred by [name], MD for tachycardia  Subjective:   Elijah Marshall, male    DOB: 11/05/1950, 70 y.o.   MRN: [id number]   Chief Complaint  Patient presents with  . Hypertension  . SVT  . New Patient (Initial Visit)     HPI   HPI:  70 y/o African American male, former smoker quit 2013 (35 pack year hx), COPD GOLD III, chronic respiratory failture, chronic sinusitis, non-small cell carcinoma of right lung s/p lobectomy, GERD, hypertension, hyperlipidemia, referred for evaluation of tachycardia  Patient recently underwent EGD for GERD with Dr. Benson Norway in December 2021 that showed focal small clusters in stomach body, suspicious, but not diagnostic of intramucosal adenocarcinoma.  He does not have any melena, hematochezia symptoms.  Patient was supposed to undergo tattooing by Dr. Benson Norway, followed by surgical removal of suspicious lesions.  However, he was found to be tachycardic at 150 bpm.  He was thus referred to [place].  Patient denies any chest pain, has stable unchanged exertional dyspnea.  He has underlying history of COPD.  Today, he feels sweaty, but denies any other symptoms, such as presyncope, syncope.  Does not feel increasing palpitation symptoms.   Past Medical History:  Diagnosis Date  . Allergy   . Arthritis   . BPH with obstruction/lower urinary tract symptoms 2016   Nocturia  . Cancer [facility])    unsure at this time  . Cancer of lower lobe of right lung ([place])   . COPD (chronic obstructive pulmonary disease) ([place])    per patient's health survey - he put a ?  . Depression   . Erectile dysfunction due to arterial insufficiency   . GERD (gastroesophageal reflux disease)   . Hyperlipidemia   . Hypertension   . Non-small cell carcinoma of lung, right ([place]) 06/24/2015  . Shortness of breath   . Ulcer      Past Surgical History:  Procedure Laterality Date  . ANTERIOR FUSION LUMBAR SPINE  07/25/2012  . ANTERIOR LAT LUMBAR FUSION Left 07/25/2012    Procedure: ANTERIOR LATERAL LUMBAR FUSION 1 LEVEL;  Surgeon: [name], MD;  Location: [place];  Service: Orthopedics;  Laterality: Left;  Left sided lumbar 3-4 lateral interbody fusion with allograft and instrumentation  . COLONOSCOPY  14   polyps rem  . JOINT REPLACEMENT Right 2010  . NODE DISSECTION Right 05/24/2015   Procedure: NODE DISSECTION;  Surgeon: [name], MD;  Location: [place];  Service: Thoracic;  Laterality: Right;  . SEGMENTECOMY Right 05/24/2015   Procedure: RIGHT LOWER LOBE SUPERIOR SEGMENTECTOMY;  Surgeon: [name], MD;  Location: [place];  Service: Thoracic;  Laterality: Right;  . [place]  2012  . VIDEO ASSISTED THORACOSCOPY (VATS)/WEDGE RESECTION Right 05/24/2015   Procedure: RIGHT VIDEO ASSISTED THORACOSCOPY (VATS)/ RIGHTR LOWER LOBE WEDGE RESECTION;  Surgeon: [name], MD;  Location: [place];  Service: Thoracic;  Laterality: Right;     Social History   Tobacco Use  Smoking Status Former Smoker  . Packs/day: 1.00  . Years: 35.00  . Pack years: 35.00  . Types: Cigarettes  . Quit date: 09/24/2011  . Years since quitting: 8.4  Smokeless Tobacco Never Used    Social History   Substance and Sexual Activity  Alcohol Use No  . Alcohol/week: 0.0 standard drinks   Comment: weekly     Family History  Problem Relation Age of Onset  . Colon cancer Mother   . Heart disease  Father   . Diabetes Sister   . Hypertension Sister   . Cancer Sister   . Hypertension Brother   . Heart disease Maternal Grandmother        heart attack     Current Outpatient Medications on File Prior to Visit  Medication Sig Dispense Refill  . albuterol (VENTOLIN HFA) 108 (90 Base) MCG/ACT inhaler Inhale 1-2 puffs into the lungs every 4 (four) hours as needed for wheezing or shortness of breath. 18 g 5  . amitriptyline (ELAVIL) 25 MG tablet TAKE 1 TABLET (25 MG TOTAL) BY MOUTH AT BEDTIME AS NEEDED FOR SLEEP. 90 tablet 0  . atorvastatin  (LIPITOR) 40 MG tablet Take 40 mg by mouth at bedtime.    . bisoprolol-hydrochlorothiazide (ZIAC) 5-6.25 MG tablet Take 1 tablet by mouth daily. 90 tablet 1  . Budeson-Glycopyrrol-Formoterol (BREZTRI AEROSPHERE) 160-9-4.8 MCG/ACT AERO Inhale 2 puffs into the lungs 2 (two) times daily. 10.7 g 11  . famotidine (PEPCID) 40 MG tablet Take 1 tablet (40 mg total) by mouth 2 (two) times daily. 180 tablet 3  . HYDROcodone-acetaminophen (NORCO/VICODIN) 5-325 MG tablet Take 1 tablet by mouth every 8 (eight) hours as needed for moderate pain. 90 tablet 0  . lansoprazole (PREVACID) 30 MG capsule TAKE 1 CAPSULE (30 MG TOTAL) BY MOUTH 2 (TWO) TIMES DAILY. 180 capsule 1  . levocetirizine (XYZAL) 5 MG tablet TAKE 1 TABLET BY MOUTH EVERY DAY AS NEEDED FOR DRIPPY NOSE 90 tablet 1  . nystatin (MYCOSTATIN) 100000 UNIT/ML suspension Take 10 mLs (1,000,000 Units total) by mouth 2 (two) times daily. 140 mL 0  . OXYGEN 2lpm with exertion only    . polyethylene glycol (MIRALAX / GLYCOLAX) 17 g packet Take 17 g by mouth daily as needed.    . Triamcinolone Acetonide (NASACORT ALLERGY 24HR NA) Place 1 spray into the nose at bedtime as needed.     No current facility-administered medications on file prior to visit.    Cardiovascular and other pertinent studies:  EKG 03/10/2020: Probable atrial flutter with 2:1 AV conduction RSR(V1) -nondiagnostic Left axis.  Nonspecific ST depression  -Nondiagnostic    Recent labs: 07/30/2019: Glucose 94, BUN/Cr 8/1.11. EGFR 78. Na/K 138/4.3. Rest of the CMP normal H/H 17.1/61.9. MCV 90.4. Platelets 268 HbA1C 6.1% Chol 181, TG 112, HDL 55, LDL 104 TSH N/A   Review of Systems  Constitutional: Positive for diaphoresis.  Cardiovascular: Negative for chest pain, dyspnea on exertion (Mild, stable), leg swelling, palpitations and syncope.         Vitals:   03/10/20 0929 03/10/20 0940  BP: (!) 150/105 (!) 153/114  Pulse: (!) 155 (!) 137  Resp: 16   Temp: (!) 96.3 F (35.7  C)   SpO2:  95%     Body mass index is 25.99 kg/m. Filed Weights   03/10/20 0929  Weight: 176 lb (79.8 kg)     Objective:   Physical Exam Vitals and nursing note reviewed.  Constitutional:      General: He is not in acute distress. Neck:     Vascular: No JVD.  Cardiovascular:     Rate and Rhythm: Regular rhythm. Tachycardia present.     Pulses: Normal pulses.     Heart sounds: No murmur heard.   Pulmonary:     Effort: Pulmonary effort is normal.     Breath sounds: Normal breath sounds. No wheezing or rales.          Assessment & Recommendations:   70 y/o African American  male, former smoker quit 2013 (35 pack year hx), COPD GOLD III, chronic respiratory failture, chronic sinusitis, non-small cell carcinoma of right lung s/p lobectomy, GERD, hypertension, hyperlipidemia, referred for evaluation of tachycardia  Tachycardia: Narrow complex tachycardia. Differentials include atrial flutter vs SVT. Atrial flutter more likely given the rate.  I attempted vagal maneuvers in the office, but it did not slow down his AV conduction CHA2DS2VASc score 2, annual stroke risk 2.2% I had a long conversation with the patient and Dr. Benson Norway (over the phone) Recommend eliquis 5 mg bid. Hopefully, he will not need long term anticoagulation. Started metoprolol tartrate 25 mg bid. F/u in 1 week. If ventricular rate remains >100 bpm, may need TEE guided cardioversion. When in sinus rhythm or rate controlled flutter, will obtain echocardiogram  Time spent: 80 miin  Thank you for referring the patient to [place]. Please feel free to contact with any questions.   Nigel Mormon, MD Pager: [phone] Office: [phone]

## 2020-03-10 ENCOUNTER — Other Ambulatory Visit: Payer: Self-pay

## 2020-03-10 ENCOUNTER — Ambulatory Visit: Payer: Medicare Other | Admitting: Cardiology

## 2020-03-10 ENCOUNTER — Encounter: Payer: Self-pay | Admitting: Cardiology

## 2020-03-10 VITALS — BP 153/114 | HR 137 | Temp 96.3°F | Resp 16 | Ht 69.0 in | Wt 176.0 lb

## 2020-03-10 DIAGNOSIS — I484 Atypical atrial flutter: Secondary | ICD-10-CM

## 2020-03-10 DIAGNOSIS — I10 Essential (primary) hypertension: Secondary | ICD-10-CM

## 2020-03-10 MED ORDER — METOPROLOL TARTRATE 25 MG PO TABS: 25.0000 mg | ORAL_TABLET | Freq: Two times a day (BID) | ORAL | 2 refills | Status: DC

## 2020-03-10 MED ORDER — APIXABAN 2.5 MG PO TABS: 5.0000 mg | ORAL_TABLET | Freq: Two times a day (BID) | ORAL | 2 refills | Status: DC

## 2020-03-11 ENCOUNTER — Telehealth: Payer: Self-pay

## 2020-03-11 DIAGNOSIS — M25551 Pain in right hip: Secondary | ICD-10-CM | POA: Diagnosis not present

## 2020-03-11 DIAGNOSIS — I10 Essential (primary) hypertension: Secondary | ICD-10-CM | POA: Diagnosis not present

## 2020-03-11 DIAGNOSIS — E785 Hyperlipidemia, unspecified: Secondary | ICD-10-CM | POA: Diagnosis not present

## 2020-03-11 DIAGNOSIS — I2510 Atherosclerotic heart disease of native coronary artery without angina pectoris: Secondary | ICD-10-CM | POA: Diagnosis not present

## 2020-03-11 DIAGNOSIS — T7840XS Allergy, unspecified, sequela: Secondary | ICD-10-CM | POA: Diagnosis not present

## 2020-03-11 DIAGNOSIS — M25552 Pain in left hip: Secondary | ICD-10-CM | POA: Diagnosis not present

## 2020-03-11 DIAGNOSIS — R7303 Prediabetes: Secondary | ICD-10-CM | POA: Diagnosis not present

## 2020-03-11 DIAGNOSIS — J449 Chronic obstructive pulmonary disease, unspecified: Secondary | ICD-10-CM | POA: Diagnosis not present

## 2020-03-11 DIAGNOSIS — E7800 Pure hypercholesterolemia, unspecified: Secondary | ICD-10-CM | POA: Diagnosis not present

## 2020-03-11 DIAGNOSIS — K5900 Constipation, unspecified: Secondary | ICD-10-CM | POA: Diagnosis not present

## 2020-03-11 NOTE — Telephone Encounter (Signed)
VM 106 : Patient called and left a message asking if about his pain medication. He wants to know, since you started him on Eliquis, is he still able to take his pain medication (Hydrocone - Acetaminophen  5-325). Please advise.

## 2020-03-11 NOTE — Telephone Encounter (Signed)
Called patient back, he has been advised to avoid taking NSAIDS. Patient also had questions about taking Metoprolol and HCTZ together. I advised him, per Dr. Virgina Jock, he doesn't see a reason why he can't take them both, but if he wants to stop one, he can as long as its not the Metoprolol.

## 2020-03-11 NOTE — Telephone Encounter (Signed)
While I have not prescribed the pain medication, I do not see any interaction with eliquis. He should avoid taking NSAIDS like ibuprofen, Advil etc.  Thanks MJP

## 2020-03-12 DIAGNOSIS — Q403 Congenital malformation of stomach, unspecified: Secondary | ICD-10-CM | POA: Diagnosis not present

## 2020-03-12 DIAGNOSIS — K257 Chronic gastric ulcer without hemorrhage or perforation: Secondary | ICD-10-CM | POA: Diagnosis not present

## 2020-03-15 DIAGNOSIS — I484 Atypical atrial flutter: Secondary | ICD-10-CM | POA: Diagnosis not present

## 2020-03-15 DIAGNOSIS — R61 Generalized hyperhidrosis: Secondary | ICD-10-CM | POA: Diagnosis not present

## 2020-03-16 LAB — CBC
Hematocrit: 53.2 % — ABNORMAL HIGH (ref 37.5–51.0)
Hemoglobin: 18.1 g/dL — ABNORMAL HIGH (ref 13.0–17.7)
MCH: 30 pg (ref 26.6–33.0)
MCHC: 34 g/dL (ref 31.5–35.7)
MCV: 88 fL (ref 79–97)
Platelets: 280 10*3/uL (ref 150–450)
RBC: 6.03 x10E6/uL — ABNORMAL HIGH (ref 4.14–5.80)
RDW: 12.7 % (ref 11.6–15.4)
WBC: 8 10*3/uL (ref 3.4–10.8)

## 2020-03-16 LAB — TSH: TSH: 0.838 u[IU]/mL (ref 0.450–4.500)

## 2020-03-17 ENCOUNTER — Encounter: Payer: Self-pay | Admitting: Cardiology

## 2020-03-17 ENCOUNTER — Ambulatory Visit: Payer: Medicare Other | Admitting: Cardiology

## 2020-03-17 ENCOUNTER — Other Ambulatory Visit: Payer: Self-pay

## 2020-03-17 ENCOUNTER — Inpatient Hospital Stay: Payer: Medicare Other

## 2020-03-17 VITALS — BP 150/91 | HR 76 | Ht 69.0 in | Wt 176.0 lb

## 2020-03-17 DIAGNOSIS — I484 Atypical atrial flutter: Secondary | ICD-10-CM

## 2020-03-17 DIAGNOSIS — I10 Essential (primary) hypertension: Secondary | ICD-10-CM

## 2020-03-17 DIAGNOSIS — I479 Paroxysmal tachycardia, unspecified: Secondary | ICD-10-CM

## 2020-03-17 NOTE — Progress Notes (Signed)
Patient referred by [name], MD for tachycardia  Subjective:   Elijah Marshall, male    DOB: 09-Mar-1950, 70 y.o.   MRN: [id number]  Chief Complaint  Patient presents with  . Tachycardia  . Follow-up     HPI   HPI:  70 y/o [place] American male, former smoker quit 2013 (35 pack year hx), COPD GOLD III, chronic respiratory failture, chronic sinusitis, non-small cell carcinoma of right lung s/p lobectomy, GERD, hypertension, hyperlipidemia, with paroxysmal tachycardia, likely atrial flutter.    At last visit, I started patient on Eliquis 5 mg twice daily.  Is tolerating well without any bleeding issues.  He had one episode of diaphoresis last night, for which she called paramedics.  He does not have any details about his EKG at that time, but was told he likely had an panic attack.  Except for this 1 episode, has not had any similar symptoms in the past week or so.   Current Outpatient Medications on File Prior to Visit  Medication Sig Dispense Refill  . albuterol (VENTOLIN HFA) 108 (90 Base) MCG/ACT inhaler Inhale 1-2 puffs into the lungs every 4 (four) hours as needed for wheezing or shortness of breath. 18 g 5  . amitriptyline (ELAVIL) 25 MG tablet TAKE 1 TABLET (25 MG TOTAL) BY MOUTH AT BEDTIME AS NEEDED FOR SLEEP. 90 tablet 0  . apixaban (ELIQUIS) 2.5 MG TABS tablet Take 2 tablets (5 mg total) by mouth 2 (two) times daily. 60 tablet 2  . atorvastatin (LIPITOR) 40 MG tablet Take 40 mg by mouth at bedtime.    . Budeson-Glycopyrrol-Formoterol (BREZTRI AEROSPHERE) 160-9-4.8 MCG/ACT AERO Inhale 2 puffs into the lungs 2 (two) times daily. 10.7 g 11  . famotidine (PEPCID) 40 MG tablet Take 1 tablet (40 mg total) by mouth 2 (two) times daily. 180 tablet 3  . hydrochlorothiazide (HYDRODIURIL) 25 MG tablet Take 25 mg by mouth daily.    Marland Kitchen HYDROcodone-acetaminophen (NORCO/VICODIN) 5-325 MG tablet Take 1 tablet by mouth every 8 (eight) hours as needed for moderate pain. 90 tablet 0  .  ipratropium (ATROVENT) 0.06 % nasal spray Place into both nostrils.    . lansoprazole (PREVACID) 30 MG capsule TAKE 1 CAPSULE (30 MG TOTAL) BY MOUTH 2 (TWO) TIMES DAILY. 180 capsule 1  . levocetirizine (XYZAL) 5 MG tablet TAKE 1 TABLET BY MOUTH EVERY DAY AS NEEDED FOR DRIPPY NOSE 90 tablet 1  . metoprolol tartrate (LOPRESSOR) 25 MG tablet Take 1 tablet (25 mg total) by mouth 2 (two) times daily. 60 tablet 2  . nystatin (MYCOSTATIN) 100000 UNIT/ML suspension Take 10 mLs (1,000,000 Units total) by mouth 2 (two) times daily. 140 mL 0  . OXYGEN 2lpm with exertion only    . polyethylene glycol (MIRALAX / GLYCOLAX) 17 g packet Take 17 g by mouth daily as needed.    . Triamcinolone Acetonide (NASACORT ALLERGY 24HR NA) Place 1 spray into the nose at bedtime as needed.     No current facility-administered medications on file prior to visit.    Cardiovascular and other pertinent studies:  EKG 03/17/2020: Sinus rhythm 68 bpm Occasional PAC    Left atrial enlargement  EKG 03/10/2020: Probable atrial flutter with 2:1 AV conduction RSR(V1) -nondiagnostic Left axis deviation Nonspecific ST depression  -Nondiagnostic    Recent labs: 07/30/2019: Glucose 94, BUN/Cr 8/1.11. EGFR 78. Na/K 138/4.3. Rest of the CMP normal H/H 17.1/61.9. MCV 90.4. Platelets 268 HbA1C 6.1% Chol 181, TG 112, HDL 55, LDL 104  TSH N/A   Review of Systems  Constitutional: Positive for diaphoresis.  Cardiovascular: Negative for chest pain, dyspnea on exertion (Mild, stable), leg swelling, palpitations and syncope.         Vitals:   03/17/20 1352 03/17/20 1359  BP: (!) 152/102 (!) 150/91  Pulse: 78 76  SpO2: 95% 95%     Body mass index is 25.99 kg/m. Filed Weights   03/17/20 1352  Weight: 176 lb (79.8 kg)     Objective:   Physical Exam Vitals and nursing note reviewed.  Constitutional:      General: He is not in acute distress. Neck:     Vascular: No JVD.  Cardiovascular:     Rate and Rhythm: Regular  rhythm. Tachycardia present.     Pulses: Normal pulses.     Heart sounds: No murmur heard.   Pulmonary:     Effort: Pulmonary effort is normal.     Breath sounds: Normal breath sounds. No wheezing or rales.          Assessment & Recommendations:   70 y/o [place] American male, former smoker quit 2013 (35 pack year hx), COPD GOLD III, chronic respiratory failture, chronic sinusitis, non-small cell carcinoma of right lung s/p lobectomy, GERD, hypertension, hyperlipidemia, referred for evaluation of tachycardia  Tachycardia: Likely paroxysmal atrial flutter, today in sinus rhythm.  Unclear if his episode on 03/16/2020 was an episode of atrial flutter with RVR.  I will obtain echocardiogram and 1 week cardiac telemetry monitoring.  CHA2DS2VASc score 2, annual stroke risk 2.2% Recommend eliquis 5 mg bid. Hopefully, he will not need long term anticoagulation. Continue metoprolol tartrate 25 mg bid.  If he does not have any recurrence of his atrial flutter, he may be able to undergo upcoming GI procedure soon.  However, if he continues to have paroxysmal atrial flutter with RVR episodes, he may need rhythm control therapy, preferably with antiarrhythmic agent.  In that case, I would obtain exercise treadmill stress test before starting him on flecainide.  Hypertension: Usually well controlled.  Continue home monitoring.  No change made today to his antihypertensive therapy.    Nigel Mormon, MD Pager: [phone] Office: [phone]

## 2020-03-19 DIAGNOSIS — R062 Wheezing: Secondary | ICD-10-CM | POA: Diagnosis not present

## 2020-03-19 DIAGNOSIS — J449 Chronic obstructive pulmonary disease, unspecified: Secondary | ICD-10-CM | POA: Diagnosis not present

## 2020-03-25 ENCOUNTER — Ambulatory Visit: Payer: Medicare Other

## 2020-03-25 ENCOUNTER — Other Ambulatory Visit: Payer: Self-pay

## 2020-03-25 DIAGNOSIS — I479 Paroxysmal tachycardia, unspecified: Secondary | ICD-10-CM

## 2020-03-25 DIAGNOSIS — I484 Atypical atrial flutter: Secondary | ICD-10-CM | POA: Diagnosis not present

## 2020-03-31 ENCOUNTER — Other Ambulatory Visit: Payer: Self-pay

## 2020-03-31 ENCOUNTER — Encounter: Payer: Self-pay | Admitting: Cardiology

## 2020-03-31 ENCOUNTER — Ambulatory Visit: Payer: Medicare Other | Admitting: Cardiology

## 2020-03-31 VITALS — BP 167/89 | HR 90 | Temp 97.8°F | Resp 16 | Ht 69.0 in | Wt 175.0 lb

## 2020-03-31 DIAGNOSIS — I484 Atypical atrial flutter: Secondary | ICD-10-CM | POA: Diagnosis not present

## 2020-03-31 DIAGNOSIS — I479 Paroxysmal tachycardia, unspecified: Secondary | ICD-10-CM | POA: Diagnosis not present

## 2020-03-31 NOTE — Progress Notes (Signed)
Patient referred by [name], MD for tachycardia  Subjective:   Elijah Marshall, male    DOB: 05/29/1950, 70 y.o.   MRN: [id number]  Chief Complaint  Patient presents with  . Paroxysmal tachycardia, unspecified   . Follow-up    2-3 weeks     HPI   HPI:  69 y/o [place] American male, former smoker quit 2013 (35 pack year hx), COPD GOLD III, chronic respiratory failture, chronic sinusitis, non-small cell carcinoma of right lung s/p lobectomy, GERD, hypertension, hyperlipidemia, with paroxysmal tachycardia, likely atrial flutter, requiring surgical removal of suspicious gastric lesions    Patient has had no recurrence of tachycardia episodes since last visit. Reviewed monitor and EKG results with the patient, details below. BP elevated today, but controlled at home. He has unchanged exertional dyspnea with COPD for many years. He denies exertional chest pain.    Current Outpatient Medications on File Prior to Visit  Medication Sig Dispense Refill  . albuterol (VENTOLIN HFA) 108 (90 Base) MCG/ACT inhaler Inhale into the lungs every 6 (six) hours as needed for wheezing or shortness of breath.    Marland Kitchen amitriptyline (ELAVIL) 25 MG tablet TAKE 1 TABLET (25 MG TOTAL) BY MOUTH AT BEDTIME AS NEEDED FOR SLEEP. 90 tablet 0  . apixaban (ELIQUIS) 2.5 MG TABS tablet Take 2 tablets (5 mg total) by mouth 2 (two) times daily. 60 tablet 2  . atorvastatin (LIPITOR) 40 MG tablet Take 40 mg by mouth at bedtime.    . Budeson-Glycopyrrol-Formoterol (BREZTRI AEROSPHERE) 160-9-4.8 MCG/ACT AERO Inhale 2 puffs into the lungs 2 (two) times daily. 10.7 g 11  . famotidine (PEPCID) 40 MG tablet Take 1 tablet (40 mg total) by mouth 2 (two) times daily. 180 tablet 3  . hydrochlorothiazide (HYDRODIURIL) 25 MG tablet Take 25 mg by mouth daily.    Marland Kitchen HYDROcodone-acetaminophen (NORCO/VICODIN) 5-325 MG tablet Take 1 tablet by mouth every 8 (eight) hours as needed for moderate pain. 90 tablet 0  . ipratropium (ATROVENT)  0.06 % nasal spray Place into both nostrils.    . lansoprazole (PREVACID) 30 MG capsule TAKE 1 CAPSULE (30 MG TOTAL) BY MOUTH 2 (TWO) TIMES DAILY. 180 capsule 1  . levocetirizine (XYZAL) 5 MG tablet TAKE 1 TABLET BY MOUTH EVERY DAY AS NEEDED FOR DRIPPY NOSE 90 tablet 1  . metoprolol tartrate (LOPRESSOR) 25 MG tablet Take 1 tablet (25 mg total) by mouth 2 (two) times daily. 60 tablet 2  . nystatin (MYCOSTATIN) 100000 UNIT/ML suspension Take 10 mLs (1,000,000 Units total) by mouth 2 (two) times daily. 140 mL 0  . OXYGEN 2lpm with exertion only    . polyethylene glycol (MIRALAX / GLYCOLAX) 17 g packet Take 17 g by mouth daily as needed.    . Triamcinolone Acetonide (NASACORT ALLERGY 24HR NA) Place 1 spray into the nose at bedtime as needed.     No current facility-administered medications on file prior to visit.    Cardiovascular and other pertinent studies:  Mobile cardiac telemetry 6 days 03/17/2020 - 03/24/2020: Dominant rhythm: Sinus. HR 54-110 bpm. Avg HR 77 bpm. <1% isolated SVE, couplet <1% isolated VE, no couplet/triplets. No atrial fibrillation/atrial flutter/SVT/VT/high grade AV block, sinus pause >3sec noted. 1 patient triggered event correlates with artifact  EKG 03/31/2020: Sinus rhythm 81 bpm  RSR(V1) -nondiagnostic  Echocardiogram 03/25/2020:  Left ventricle cavity is normal in size and wall thickness. Normal global  wall motion. Normal LV systolic function with EF 65%. Normal diastolic  filling pattern.  No significant valvular abnormality.  normal right atrial pressure.   Recent labs: 07/30/2019: Glucose 94, BUN/Cr 8/1.11. EGFR 78. Na/K 138/4.3. Rest of the CMP normal H/H 17.1/61.9. MCV 90.4. Platelets 268 HbA1C 6.1% Chol 181, TG 112, HDL 55, LDL 104 TSH N/A   Review of Systems  Constitutional: Positive for diaphoresis.  Cardiovascular: Negative for chest pain, dyspnea on exertion (Mild, stable), leg swelling, palpitations and syncope.         Vitals:    03/31/20 1031 03/31/20 1034  BP: (!) 149/98 (!) 167/89  Pulse: 92 90  Resp: 16   Temp: 97.8 F (36.6 C)   SpO2: 95%      Body mass index is 25.84 kg/m. Filed Weights   03/31/20 1031  Weight: 175 lb (79.4 kg)     Objective:   Physical Exam Vitals and nursing note reviewed.  Constitutional:      General: He is not in acute distress. Neck:     Vascular: No JVD.  Cardiovascular:     Rate and Rhythm: Regular rhythm. Tachycardia present.     Pulses: Normal pulses.     Heart sounds: No murmur heard.   Pulmonary:     Effort: Pulmonary effort is normal.     Breath sounds: Normal breath sounds. No wheezing or rales.          Assessment & Recommendations:   70 y/o [place] American male, former smoker quit 2013 (35 pack year hx), COPD GOLD III, chronic respiratory failture, chronic sinusitis, non-small cell carcinoma of right lung s/p lobectomy, GERD, hypertension, hyperlipidemia, with paroxysmal tachycardia, likely atrial flutter, requiring surgical removal of suspicious gastric lesions    Tachycardia: Likely paroxysmal atrial flutter, today in sinus rhythm.  No recurrence CHA2DS2VASc score 2, annual stroke risk 2.2% Can discontinue Eliquis at end of February Continue metoprolol tartrate 25 mg bid.  Good baseline functional capacity. Structurally normal heart. Okay to proceed with upcoming GI procedure.  Hypertension: Usually well controlled.  Continue home monitoring.  No change made today to his antihypertensive therapy.  F/u in 3 months   Nigel Mormon, MD Pager: [phone] Office: [phone]

## 2020-04-02 ENCOUNTER — Telehealth: Payer: Self-pay | Admitting: Internal Medicine

## 2020-04-02 MED ORDER — AZITHROMYCIN 250 MG PO TABS: ORAL_TABLET | ORAL | 0 refills | Status: DC

## 2020-04-02 NOTE — Telephone Encounter (Signed)
rx Zpak  Use mucinex dm 1200 mg every 12 hours for the cough   if bleeding gets a lot worse then stop the eliquis for the weekend and regroup on Monday feb 28

## 2020-04-02 NOTE — Telephone Encounter (Signed)
Spoke with the pt  He states last night started coughing more, and he coughed up some light brown sputum mixed with blood  He has had this happen some this morning as well, small amounts of blood tinged sputum  He states this has never happened to him and he is concerned bc just started on Eliquis 5 mg bid recently  He states that he is not having any increased SOB, wheezing, f/c/s  He has had a raspy voice today, and states that he feels pressure behind his eyes and has slight HA since last night  He is not having any nasal d/c He is still on breztri and uses albuterol inhaler rarely  Pt has had his covid vaccines including booster Please advise thanks!

## 2020-04-02 NOTE — Telephone Encounter (Signed)
Spoke with patient. He verbalized understanding of recommendations. Will go ahead and send in the zpak.   He stated that he was told to stop the Eliquis on February 28th by his cardiologist but will stop it today.   He is aware to call us back if anything changes.   Nothing further needed at time of call.

## 2020-04-05 DIAGNOSIS — I479 Paroxysmal tachycardia, unspecified: Secondary | ICD-10-CM | POA: Diagnosis not present

## 2020-04-13 ENCOUNTER — Telehealth: Payer: Self-pay | Admitting: Cardiology

## 2020-04-13 ENCOUNTER — Other Ambulatory Visit (HOSPITAL_COMMUNITY)
Admission: RE | Admit: 2020-04-13 | Discharge: 2020-04-13 | Disposition: A | Discharge status: Home / Self Care | Payer: Medicare Other | Source: Ambulatory Visit | Attending: Gastroenterology | Admitting: Gastroenterology

## 2020-04-13 ENCOUNTER — Other Ambulatory Visit: Payer: Self-pay | Admitting: Gastroenterology

## 2020-04-13 DIAGNOSIS — Z01812 Encounter for preprocedural laboratory examination: Secondary | ICD-10-CM | POA: Diagnosis not present

## 2020-04-13 DIAGNOSIS — Z20822 Contact with and (suspected) exposure to covid-19: Secondary | ICD-10-CM | POA: Diagnosis not present

## 2020-04-13 DIAGNOSIS — I484 Atypical atrial flutter: Secondary | ICD-10-CM

## 2020-04-13 NOTE — Telephone Encounter (Signed)
-----   Message from Carol Ada, MD sent at 04/13/2020 11:59 AM EST ----- Victor Langenbach,   The patient was tachycardic again for his EGD in my office.  The case was cancelled by my anesthesia.  The plan is to take him later this week to [facility].  With his tachycardia, is it okay for anesthesia to give him a beta blocker to control his rate for the EGD?  Saralyn Pilar

## 2020-04-13 NOTE — Telephone Encounter (Signed)
That's unfortunate. He had not had any recurrence after his initial visit with me.   Yes, it is okay to give IV lopressor by anesthesia. He likely has a reentrant tachyarrhythmia, like atrial flutter or SVT. With his ongoing workup for potentially malignant diagnosis, I would not recommend invasive therapy like ablation. I will be available for consult, should he have any issues during the procedure.   Nigel Mormon, MD Pager: [phone] Office: [phone]

## 2020-04-14 LAB — SARS CORONAVIRUS 2 (TAT 6-24 HRS): SARS Coronavirus 2: NEGATIVE

## 2020-04-14 NOTE — Progress Notes (Signed)
Attempted to obtain medical history via telephone, unable to reach at this time. I left a voicemail to return pre surgical testing department's phone call.  

## 2020-04-16 ENCOUNTER — Encounter (HOSPITAL_COMMUNITY): Payer: Self-pay | Admitting: Gastroenterology

## 2020-04-16 ENCOUNTER — Encounter (HOSPITAL_COMMUNITY)
Admission: RE | Disposition: A | Discharge status: Home / Self Care | Payer: Self-pay | Source: Home / Self Care | Attending: Gastroenterology

## 2020-04-16 ENCOUNTER — Ambulatory Visit (HOSPITAL_COMMUNITY): Payer: Medicare Other | Admitting: Certified Registered Nurse Anesthetist

## 2020-04-16 ENCOUNTER — Other Ambulatory Visit: Payer: Self-pay

## 2020-04-16 ENCOUNTER — Ambulatory Visit (HOSPITAL_COMMUNITY)
Admission: RE | Admit: 2020-04-16 | Discharge: 2020-04-16 | Disposition: A | Discharge status: Home / Self Care | Payer: Medicare Other | Attending: Gastroenterology | Admitting: Gastroenterology

## 2020-04-16 DIAGNOSIS — I484 Atypical atrial flutter: Secondary | ICD-10-CM | POA: Diagnosis not present

## 2020-04-16 DIAGNOSIS — C169 Malignant neoplasm of stomach, unspecified: Secondary | ICD-10-CM | POA: Diagnosis not present

## 2020-04-16 DIAGNOSIS — Z885 Allergy status to narcotic agent status: Secondary | ICD-10-CM | POA: Insufficient documentation

## 2020-04-16 DIAGNOSIS — Z886 Allergy status to analgesic agent status: Secondary | ICD-10-CM | POA: Insufficient documentation

## 2020-04-16 DIAGNOSIS — K2970 Gastritis, unspecified, without bleeding: Secondary | ICD-10-CM | POA: Diagnosis not present

## 2020-04-16 DIAGNOSIS — K259 Gastric ulcer, unspecified as acute or chronic, without hemorrhage or perforation: Secondary | ICD-10-CM | POA: Insufficient documentation

## 2020-04-16 DIAGNOSIS — Z833 Family history of diabetes mellitus: Secondary | ICD-10-CM | POA: Diagnosis not present

## 2020-04-16 DIAGNOSIS — K3189 Other diseases of stomach and duodenum: Secondary | ICD-10-CM | POA: Diagnosis not present

## 2020-04-16 DIAGNOSIS — Z8249 Family history of ischemic heart disease and other diseases of the circulatory system: Secondary | ICD-10-CM | POA: Diagnosis not present

## 2020-04-16 DIAGNOSIS — Z800 Family history of malignant neoplasm of digestive organs: Secondary | ICD-10-CM | POA: Insufficient documentation

## 2020-04-16 DIAGNOSIS — Z85118 Personal history of other malignant neoplasm of bronchus and lung: Secondary | ICD-10-CM | POA: Diagnosis not present

## 2020-04-16 DIAGNOSIS — Z87891 Personal history of nicotine dependence: Secondary | ICD-10-CM | POA: Insufficient documentation

## 2020-04-16 DIAGNOSIS — J449 Chronic obstructive pulmonary disease, unspecified: Secondary | ICD-10-CM | POA: Diagnosis not present

## 2020-04-16 DIAGNOSIS — R062 Wheezing: Secondary | ICD-10-CM | POA: Diagnosis not present

## 2020-04-16 DIAGNOSIS — E785 Hyperlipidemia, unspecified: Secondary | ICD-10-CM | POA: Diagnosis not present

## 2020-04-16 DIAGNOSIS — Z9660 Presence of unspecified orthopedic joint implant: Secondary | ICD-10-CM | POA: Diagnosis not present

## 2020-04-16 DIAGNOSIS — K219 Gastro-esophageal reflux disease without esophagitis: Secondary | ICD-10-CM | POA: Diagnosis not present

## 2020-04-16 HISTORY — PX: SUBMUCOSAL TATTOO INJECTION: SHX6856

## 2020-04-16 HISTORY — PX: UPPER ESOPHAGEAL ENDOSCOPIC ULTRASOUND (EUS): SHX6562

## 2020-04-16 HISTORY — PX: BIOPSY: SHX5522

## 2020-04-16 HISTORY — PX: ESOPHAGOGASTRODUODENOSCOPY (EGD) WITH PROPOFOL: SHX5813

## 2020-04-16 SURGERY — UPPER ESOPHAGEAL ENDOSCOPIC ULTRASOUND (EUS)
Anesthesia: Monitor Anesthesia Care

## 2020-04-16 MED ORDER — PROPOFOL 10 MG/ML IV BOLUS
INTRAVENOUS | Status: AC
  Filled 2020-04-16: qty 20

## 2020-04-16 MED ORDER — SODIUM CHLORIDE 0.9 % IV SOLN: INTRAVENOUS | Status: DC

## 2020-04-16 MED ORDER — PROPOFOL 500 MG/50ML IV EMUL
INTRAVENOUS | Status: AC
  Filled 2020-04-16: qty 50

## 2020-04-16 MED ORDER — PROPOFOL 500 MG/50ML IV EMUL
INTRAVENOUS | Status: DC | PRN
  Administered 2020-04-16: 150 ug/kg/min via INTRAVENOUS

## 2020-04-16 MED ORDER — MIDAZOLAM HCL 2 MG/2ML IJ SOLN
INTRAMUSCULAR | Status: DC | PRN
  Administered 2020-04-16: 2 mg via INTRAVENOUS

## 2020-04-16 MED ORDER — PHENYLEPHRINE 40 MCG/ML (10ML) SYRINGE FOR IV PUSH (FOR BLOOD PRESSURE SUPPORT)
PREFILLED_SYRINGE | INTRAVENOUS | Status: DC | PRN
  Administered 2020-04-16: 80 ug via INTRAVENOUS

## 2020-04-16 MED ORDER — LACTATED RINGERS IV SOLN
INTRAVENOUS | Status: DC
  Administered 2020-04-16: 1000 mL via INTRAVENOUS

## 2020-04-16 MED ORDER — SPOT INK MARKER SYRINGE KIT
PACK | SUBMUCOSAL | Status: DC | PRN
  Administered 2020-04-16: 2.5 mL via SUBMUCOSAL

## 2020-04-16 MED ORDER — MIDAZOLAM HCL 2 MG/2ML IJ SOLN
INTRAMUSCULAR | Status: AC
  Filled 2020-04-16: qty 2

## 2020-04-16 MED ORDER — METOPROLOL TARTRATE 5 MG/5ML IV SOLN
INTRAVENOUS | Status: DC | PRN
  Administered 2020-04-16 (×2): 2 mg via INTRAVENOUS

## 2020-04-16 MED ORDER — SPOT INK MARKER SYRINGE KIT: PACK | SUBMUCOSAL | Status: DC | PRN

## 2020-04-16 MED ORDER — PROPOFOL 10 MG/ML IV BOLUS
INTRAVENOUS | Status: DC | PRN
  Administered 2020-04-16 (×2): 20 mg via INTRAVENOUS

## 2020-04-16 NOTE — Op Note (Signed)
[facility] Patient Name: Elijah Marshall Procedure Date: 04/16/2020 MRN: [id number] Attending MD: Carol Ada , MD Date of Birth: Mar 01, 1950 CSN: [id number] Age: 70 Admit Type: Outpatient Procedure:                Upper EUS Indications:              Suspected gastric neoplasm Providers:                Carol Ada, MD, Particia Nearing, RN, Laverda Sorenson,                            Technician, Dellie Catholic Referring MD:              Medicines:                Propofol per Anesthesia Complications:            No immediate complications. Estimated Blood Loss:     Estimated blood loss: none. Procedure:                Pre-Anesthesia Assessment:                           - Prior to the procedure, a History and Physical                            was performed, and patient medications and                            allergies were reviewed. The patient's tolerance of                            previous anesthesia was also reviewed. The risks                            and benefits of the procedure and the sedation                            options and risks were discussed with the patient.                            All questions were answered, and informed consent                            was obtained. Prior Anticoagulants: The patient has                            taken Eliquis (apixaban), last dose was 3 days                            prior to procedure. ASA Grade Assessment: III - A                            patient with severe systemic disease. After  reviewing the risks and benefits, the patient was                            deemed in satisfactory condition to undergo the                            procedure.                           - Sedation was administered by an anesthesia                            professional. Deep sedation was attained.                           After obtaining informed consent, the endoscope was                             passed under direct vision. Throughout the                            procedure, the patient's blood pressure, pulse, and                            oxygen saturations were monitored continuously. The                            [id number] ([id number] Olympus Linear EUS was                            introduced through the mouth, and advanced to the                            second part of duodenum. The upper EUS was                            accomplished without difficulty. The patient                            tolerated the procedure well. Scope In: Scope Out: Findings:      ENDOSCOPIC FINDING: :      The examined esophagus was endoscopically normal.      One non-bleeding superficial gastric ulcer with no stigmata of bleeding       was found on the greater curvature of the gastric body. The lesion was 4       mm in largest dimension. Biopsies were taken with a cold forceps for       histology. Area was tattooed with an injection of 2 mL of Spot (carbon       black).      ENDOSONOGRAPHIC FINDING: :      There was no sign of significant endosonographic abnormality in the       entire pancreas. The pancreatic duct measured up to 1 mm in diameter.      There was no sign of significant endosonographic abnormality in the  common bile duct and in the gallbladder. The maximum diameter of the       ducts were 5 mm.      The gastric ulcer was again identified in the distal gastric body along       the greater curvature. Cold biopsies were repeated. The right and left       lateral portions were tattooed with SPOT. The evaluation of his pancreas       was negative for any abnormalities. Impression:               - Normal esophagus.                           - Non-bleeding gastric ulcer with no stigmata of                            bleeding. Biopsied. Tattooed.                           - There was no sign of significant pathology in the                            entire  pancreas.                           - There was no sign of significant pathology in the                            common bile duct and in the gallbladder. Moderate Sedation:      Not Applicable - Patient had care per Anesthesia. Recommendation:           - Patient has a contact number available for                            emergencies. The signs and symptoms of potential                            delayed complications were discussed with the                            patient. Return to normal activities tomorrow.                            Written discharge instructions were provided to the                            patient.                           - Resume regular diet.                           - Dr. Barry Dienes will schedule the patient for                            resection. Procedure Code(s):        ---  Professional ---                           [phone], Esophagogastroduodenoscopy, flexible,                            transoral; with endoscopic ultrasound examination                            limited to the esophagus, stomach or duodenum, and                            adjacent structures                           43239, Esophagogastroduodenoscopy, flexible,                            transoral; with biopsy, single or multiple                           43236, 59, Esophagogastroduodenoscopy, flexible,                            transoral; with directed submucosal injection(s),                            any substance Diagnosis Code(s):        --- Professional ---                           K25.9, Gastric ulcer, unspecified as acute or                            chronic, without hemorrhage or perforation CPT copyright 2019 American [employer] Association. All rights reserved. The codes documented in this report are preliminary and upon coder review may  be revised to meet current compliance requirements. Carol Ada, MD Carol Ada, MD 04/16/2020 3:21:54 PM This report has been  signed electronically. Number of Addenda: 0

## 2020-04-16 NOTE — Discharge Instructions (Signed)

## 2020-04-16 NOTE — Anesthesia Postprocedure Evaluation (Signed)
Anesthesia Post Note  Patient: Elijah Marshall  Procedure(s) Performed: UPPER ESOPHAGEAL ENDOSCOPIC ULTRASOUND (EUS) (N/A ) SUBMUCOSAL TATTOO INJECTION BIOPSY     Patient location during evaluation: PACU Anesthesia Type: MAC Level of consciousness: awake and alert Pain management: pain level controlled Vital Signs Assessment: post-procedure vital signs reviewed and stable Respiratory status: spontaneous breathing, nonlabored ventilation and respiratory function stable Cardiovascular status: stable and blood pressure returned to baseline Anesthetic complications: no   No complications documented.  Last Vitals:  Vitals:   04/16/20 1500 04/16/20 1510  BP: 108/79 109/79  Pulse: 82 76  Resp: 18 16  Temp:    SpO2: 96% 95%    Last Pain:  Vitals:   04/16/20 1510  TempSrc:   PainSc: 0-No pain                 Audry Pili

## 2020-04-16 NOTE — Anesthesia Preprocedure Evaluation (Addendum)
Anesthesia Evaluation  Patient identified by MRN, date of birth, ID band Patient awake    Reviewed: Allergy & Precautions, NPO status , Patient's Chart, lab work & pertinent test results  History of Anesthesia Complications Negative for: history of anesthetic complications  Airway Mallampati: II  TM Distance: >3 FB Neck ROM: Full    Dental  (+) Dental Advisory Given, Partial Upper, Loose,    Pulmonary COPD,  COPD inhaler, former smoker,   RLL lung cancer s/p wedge resection     Pulmonary exam normal        Cardiovascular hypertension, Pt. on medications + CAD  Normal cardiovascular exam+ dysrhythmias Atrial Fibrillation      Neuro/Psych PSYCHIATRIC DISORDERS Depression negative neurological ROS     GI/Hepatic Neg liver ROS, GERD  Medicated and Controlled,  Endo/Other  negative endocrine ROS  Renal/GU negative Renal ROS     Musculoskeletal  (+) Arthritis ,   Abdominal   Peds  Hematology negative hematology ROS (+)   Anesthesia Other Findings Covid test negative   Reproductive/Obstetrics                            Anesthesia Physical Anesthesia Plan  ASA: III  Anesthesia Plan: MAC   Post-op Pain Management:    Induction: Intravenous  PONV Risk Score and Plan: 1 and Propofol infusion and Treatment may vary due to age or medical condition  Airway Management Planned: Nasal Cannula and Natural Airway  Additional Equipment: None  Intra-op Plan:   Post-operative Plan:   Informed Consent: I have reviewed the patients History and Physical, chart, labs and discussed the procedure including the risks, benefits and alternatives for the proposed anesthesia with the patient or authorized representative who has indicated his/her understanding and acceptance.       Plan Discussed with: CRNA and Anesthesiologist  Anesthesia Plan Comments:        Anesthesia Quick  Evaluation

## 2020-04-16 NOTE — H&P (Signed)
  Elijah Marshall HPI: The patient is here for tattooing of an early gastric cancer.  The next step is for surgical resection.  He also mentioned that his sister was recently diagnosed with pancreatic cancer.  It was decided to perform an EUS at the same time.  Past Medical History:  Diagnosis Date  . Allergy   . Arthritis   . BPH with obstruction/lower urinary tract symptoms 2016   Nocturia  . Cancer [facility])    unsure at this time  . Cancer of lower lobe of right lung ([place])   . COPD (chronic obstructive pulmonary disease) ([place])    per patient's health survey - he put a ?  . Depression   . Erectile dysfunction due to arterial insufficiency   . GERD (gastroesophageal reflux disease)   . Hyperlipidemia   . Hypertension   . Non-small cell carcinoma of lung, right ([place]) 06/24/2015  . Shortness of breath   . Ulcer     Past Surgical History:  Procedure Laterality Date  . ANTERIOR FUSION LUMBAR SPINE  07/25/2012  . ANTERIOR LAT LUMBAR FUSION Left 07/25/2012   Procedure: ANTERIOR LATERAL LUMBAR FUSION 1 LEVEL;  Surgeon: [name], MD;  Location: [place];  Service: Orthopedics;  Laterality: Left;  Left sided lumbar 3-4 lateral interbody fusion with allograft and instrumentation  . COLONOSCOPY  14   polyps rem  . JOINT REPLACEMENT Right 2010  . NODE DISSECTION Right 05/24/2015   Procedure: NODE DISSECTION;  Surgeon: [name], MD;  Location: [place];  Service: Thoracic;  Laterality: Right;  . SEGMENTECOMY Right 05/24/2015   Procedure: RIGHT LOWER LOBE SUPERIOR SEGMENTECTOMY;  Surgeon: [name], MD;  Location: [place];  Service: Thoracic;  Laterality: Right;  . [place]  2012  . VIDEO ASSISTED THORACOSCOPY (VATS)/WEDGE RESECTION Right 05/24/2015   Procedure: RIGHT VIDEO ASSISTED THORACOSCOPY (VATS)/ RIGHTR LOWER LOBE WEDGE RESECTION;  Surgeon: [name], MD;  Location: [place];  Service: Thoracic;  Laterality: Right;    Family History  Problem  Relation Age of Onset  . Colon cancer Mother   . Heart disease Father   . Diabetes Sister   . Hypertension Sister   . Cancer Sister   . Hypertension Brother   . Heart disease Maternal Grandmother        heart attack    Social History:  reports that he quit smoking about 8 years ago. His smoking use included cigarettes. He has a 35.00 pack-year smoking history. He has never used smokeless tobacco. He reports that he does not drink alcohol and does not use drugs.  Allergies:  Allergies  Allergen Reactions  . Aspirin Nausea And Vomiting  . Morphine And Related Itching    Medications:  Scheduled:  Continuous: . sodium chloride    . lactated ringers 10 mL/hr at 04/16/20 1347    No results found for this or any previous visit (from the past 24 hour(s)).   No results found.  ROS:  As stated above in the HPI otherwise negative.  Blood pressure (!) 166/97, pulse (!) 103, temperature 98 F (36.7 C), temperature source Oral, resp. rate 16, SpO2 94 %.    PE: Gen: NAD, Alert and Oriented HEENT:  [place]/AT, EOMI Neck: Supple, no LAD Lungs: CTA Bilaterally CV: RRR without M/G/R ABD: Soft, NTND, +BS Ext: No C/C/E  Assessment/Plan: 1) Early gastric cancer - EGD/EUS with tattoo.  Demoni Gergen D 04/16/2020, 1:48 PM

## 2020-04-16 NOTE — Transfer of Care (Signed)
Immediate Anesthesia Transfer of Care Note  Patient: Elijah Marshall  Procedure(s) Performed: UPPER ESOPHAGEAL ENDOSCOPIC ULTRASOUND (EUS) (N/A ) SUBMUCOSAL TATTOO INJECTION BIOPSY  Patient Location: Endoscopy Unit  Anesthesia Type:MAC  Level of Consciousness: drowsy  Airway & Oxygen Therapy: Patient Spontanous Breathing and Patient connected to face mask  Post-op Assessment: Report given to RN and Post -op Vital signs reviewed and stable  Post vital signs: Reviewed and stable  Last Vitals:  Vitals Value Taken Time  BP 100/64 04/16/20 1440  Temp    Pulse 83 04/16/20 1441  Resp 12 04/16/20 1441  SpO2 100 % 04/16/20 1441  Vitals shown include unvalidated device data.  Last Pain:  Vitals:   04/16/20 1209  TempSrc: Oral  PainSc: 3          Complications: No complications documented.

## 2020-04-19 ENCOUNTER — Encounter (HOSPITAL_COMMUNITY): Payer: Self-pay | Admitting: Gastroenterology

## 2020-04-19 LAB — SURGICAL PATHOLOGY

## 2020-04-21 ENCOUNTER — Other Ambulatory Visit: Payer: Self-pay | Admitting: General Surgery

## 2020-04-21 DIAGNOSIS — Q403 Congenital malformation of stomach, unspecified: Secondary | ICD-10-CM | POA: Diagnosis not present

## 2020-04-21 DIAGNOSIS — K257 Chronic gastric ulcer without hemorrhage or perforation: Secondary | ICD-10-CM | POA: Diagnosis not present

## 2020-04-24 ENCOUNTER — Other Ambulatory Visit: Payer: Self-pay | Admitting: Cardiology

## 2020-04-24 DIAGNOSIS — I484 Atypical atrial flutter: Secondary | ICD-10-CM

## 2020-04-28 ENCOUNTER — Other Ambulatory Visit: Payer: Self-pay | Admitting: Cardiology

## 2020-05-14 ENCOUNTER — Telehealth: Payer: Self-pay | Admitting: Internal Medicine

## 2020-05-14 NOTE — Telephone Encounter (Signed)
Primary Pulmonologist: MW  Last office visit and with whom: 02/25/20 with Beth  What do we see them for (pulmonary problems): COPD Last OV assessment/plan: COPD: - Continue Breztri two puffs morning and evening - Use albuterol rescue inhaler 2 puffs every 4-6 hours for breakthrough shortness of breath or wheezing - Use 2L oxygen on exertion as needed - Continue to stay as active as possible - If you develop purulent mucus, worsening cough or shortness of breath please notify office  Orders: PFTs in 6 months  Follow-up: 6 months with Dr. Melvyn Novas   Was appointment offered to patient (explain)?  Patient wanted recommendations.   Reason for call: Called and spoke with patient. He stated that he has noticed an increase in sinus pressure around his eyes and nose for the past week. He developed a cough that started about a week ago. At first the cough was productive with yellow phlegm. He started taking the Mucinex DM and now the cough is nonproductive. Denied being around anyone who has been sick recently. He is still using his Breztri inhaler and albuterol as needed. He denied any increased SOB.   (examples of things to ask: : When did symptoms start? Fever? Cough? Productive? Color to sputum? More sputum than usual? Wheezing? Have you needed increased oxygen? Are you taking your respiratory medications? What over the counter measures have you tried?)  Allergies  Allergen Reactions  . Aspirin Nausea And Vomiting  . Morphine And Related Itching    Immunization History  Administered Date(s) Administered  . Fluad Quad(high Dose 65+) 11/07/2018  . Influenza, High Dose Seasonal PF 11/21/2016, 11/16/2017  . Influenza,inj,Quad PF,6+ Mos 01/15/2015  . Influenza-Unspecified 11/02/2015  . PFIZER(Purple Top)SARS-COV-2 Vaccination 03/20/2019  . Pneumococcal Conjugate-13 04/17/2017, 09/06/2018  . Pneumococcal Polysaccharide-23 07/26/2012  . Tdap 09/06/2018  . Zoster Recombinat (Shingrix)  09/06/2018, 11/07/2018   He believes he has an sinus infection. He is requesting to have an abx sent to pharmacy. [facility]acy [facility] [address], can you please advise? Thanks.

## 2020-05-17 DIAGNOSIS — J449 Chronic obstructive pulmonary disease, unspecified: Secondary | ICD-10-CM | POA: Diagnosis not present

## 2020-05-17 DIAGNOSIS — R062 Wheezing: Secondary | ICD-10-CM | POA: Diagnosis not present

## 2020-05-17 MED ORDER — PREDNISONE 10 MG PO TABS: ORAL_TABLET | ORAL | 0 refills | Status: DC

## 2020-05-17 MED ORDER — AZITHROMYCIN 250 MG PO TABS: ORAL_TABLET | ORAL | 0 refills | Status: DC

## 2020-05-17 NOTE — Telephone Encounter (Signed)
Spoke with patient. He stated that he was feeling somewhat better but still has the productive cough, especially when laying down flat. He wants to go ahead and start the zpak and prednisone to see if this will help. RXs [facility] on [address].   Nothing further needed at time of call.

## 2020-05-17 NOTE — Telephone Encounter (Signed)
Sorry I was traveling and could not respond to message 4/8  If not bettter ok to rx zpak  If any trouble breathing or increased need for albuterol ok to rx with   Prednisone 10 mg take  4 each am x 2 days,   2 each am x 2 days,  1 each am x 2 days and stop

## 2020-06-04 ENCOUNTER — Other Ambulatory Visit: Payer: Self-pay | Admitting: Internal Medicine

## 2020-06-16 DIAGNOSIS — J449 Chronic obstructive pulmonary disease, unspecified: Secondary | ICD-10-CM | POA: Diagnosis not present

## 2020-06-16 DIAGNOSIS — R062 Wheezing: Secondary | ICD-10-CM | POA: Diagnosis not present

## 2020-06-22 NOTE — Progress Notes (Signed)
Surgical Instructions    Your procedure is scheduled on Tuesday, May 24th, 2022  Report to [facility] Main Entrance "A" at 05:30 A.M., then check in with the Admitting office.  Call this number if you have problems the morning of surgery:  [phone]   If you have any questions prior to your surgery date call [phone]: Open Monday-Friday 8am-4pm    Remember:  Do not eat after midnight the night before your surgery  You may drink clear liquids until 04:30 the morning of your surgery.   Clear liquids allowed are: Water, Non-Citrus Juices (without pulp), Carbonated Beverages, Clear Tea, Black Coffee Only, and Gatorade    Take these medicines the morning of surgery with A SIP OF WATER   atorvastatin (LIPITOR) BREZTRI AEROSPHERE - please, bring the inhaler with you famotidine (PEPCID)  lansoprazole (PREVACID) metoprolol tartrate (LOPRESSOR)  If needed:   albuterol (VENTOLIN HFA) - please, bring the inhaler with you baclofen (LIORESAL) HYDROcodone-acetaminophen (NORCO)  levocetirizine (XYZAL)  As of today, STOP taking any Aspirin (unless otherwise instructed by your surgeon) Aleve, Naproxen, Ibuprofen, Motrin, Advil, Goody's, BC's, all herbal medications, fish oil, and all vitamins.                     Do not wear jewelry            Do not wear lotions, powders, colognes, or deodorant.            Men may shave face and neck.            Do not bring valuables to the hospital.            [facility] is not responsible for any belongings or valuables.  Do NOT Smoke (Tobacco/Vaping) or drink Alcohol 24 hours prior to your procedure If you use a CPAP at night, you may bring all equipment for your overnight stay.   Contacts, glasses, dentures or bridgework may not be worn into surgery, please bring cases for these belongings   For patients admitted to the hospital, discharge time will be determined by your treatment team.   Patients discharged the day of surgery will not be  allowed to drive home, and someone needs to stay with them for 24 hours.    Special instructions:   [place]- Preparing For Surgery  Before surgery, you can play an important role. Because skin is not sterile, your skin needs to be as free of germs as possible. You can reduce the number of germs on your skin by washing with CHG (chlorahexidine gluconate) Soap before surgery.  CHG is an antiseptic cleaner which kills germs and bonds with the skin to continue killing germs even after washing.    Oral Hygiene is also important to reduce your risk of infection.  Remember - BRUSH YOUR TEETH THE MORNING OF SURGERY WITH YOUR REGULAR TOOTHPASTE  Please do not use if you have an allergy to CHG or antibacterial soaps. If your skin becomes reddened/irritated stop using the CHG.  Do not shave (including legs and underarms) for at least 48 hours prior to first CHG shower. It is OK to shave your face.  Please follow these instructions carefully.   1. Shower the NIGHT BEFORE SURGERY and the MORNING OF SURGERY  2. If you chose to wash your hair, wash your hair first as usual with your normal shampoo.  3. After you shampoo, rinse your hair and body thoroughly to remove the shampoo.  4. Use CHG  Soap as you would any other liquid soap. You can apply CHG directly to the skin and wash gently with a scrungie or a clean washcloth.   5. Apply the CHG Soap to your body ONLY FROM THE NECK DOWN.  Do not use on open wounds or open sores. Avoid contact with your eyes, ears, mouth and genitals (private parts). Wash Face and genitals (private parts)  with your normal soap.   6. Wash thoroughly, paying special attention to the area where your surgery will be performed.  7. Thoroughly rinse your body with warm water from the neck down.  8. DO NOT shower/wash with your normal soap after using and rinsing off the CHG Soap.  9. Pat yourself dry with a CLEAN TOWEL.  10. Wear CLEAN PAJAMAS to bed the night before  surgery  11. Place CLEAN SHEETS on your bed the night before your surgery  12. DO NOT SLEEP WITH PETS.   Day of Surgery: Take a shower with CHG soap. Wear Clean/Comfortable clothing the morning of surgery Do not apply any deodorants/lotions.   Remember to brush your teeth WITH YOUR REGULAR TOOTHPASTE.   Please read over the following fact sheets that you were given.

## 2020-06-23 ENCOUNTER — Inpatient Hospital Stay (HOSPITAL_COMMUNITY)
Admission: RE | Admit: 2020-06-23 | Discharge: 2020-06-23 | Disposition: A | Discharge status: Home / Self Care | Payer: Medicare Other | Source: Ambulatory Visit

## 2020-06-25 ENCOUNTER — Other Ambulatory Visit (HOSPITAL_COMMUNITY): Payer: Medicare Other

## 2020-06-28 ENCOUNTER — Ambulatory Visit: Payer: Medicare Other | Admitting: Cardiology

## 2020-07-07 ENCOUNTER — Telehealth: Payer: Self-pay | Admitting: Internal Medicine

## 2020-07-07 MED ORDER — BREZTRI AEROSPHERE 160-9-4.8 MCG/ACT IN AERO: 2.0000 | INHALATION_SPRAY | Freq: Two times a day (BID) | RESPIRATORY_TRACT | 0 refills | Status: DC

## 2020-07-07 NOTE — Telephone Encounter (Signed)
Samples have been placed up front for pt. Called and spoke with pt letting him know this had been done and he verbalized understanding. Nothing further needed.

## 2020-07-20 ENCOUNTER — Encounter (HOSPITAL_COMMUNITY): Payer: Self-pay

## 2020-07-20 ENCOUNTER — Encounter (HOSPITAL_COMMUNITY)
Admission: RE | Admit: 2020-07-20 | Discharge: 2020-07-20 | Disposition: A | Discharge status: Home / Self Care | Payer: Medicare Other | Source: Ambulatory Visit | Attending: General Surgery | Admitting: General Surgery

## 2020-07-20 ENCOUNTER — Other Ambulatory Visit: Payer: Self-pay

## 2020-07-20 DIAGNOSIS — J9610 Chronic respiratory failure, unspecified whether with hypoxia or hypercapnia: Secondary | ICD-10-CM | POA: Insufficient documentation

## 2020-07-20 DIAGNOSIS — I479 Paroxysmal tachycardia, unspecified: Secondary | ICD-10-CM | POA: Insufficient documentation

## 2020-07-20 DIAGNOSIS — Z01812 Encounter for preprocedural laboratory examination: Secondary | ICD-10-CM | POA: Diagnosis present

## 2020-07-20 DIAGNOSIS — Z87891 Personal history of nicotine dependence: Secondary | ICD-10-CM | POA: Insufficient documentation

## 2020-07-20 DIAGNOSIS — Z85110 Personal history of malignant carcinoid tumor of bronchus and lung: Secondary | ICD-10-CM | POA: Diagnosis not present

## 2020-07-20 DIAGNOSIS — E785 Hyperlipidemia, unspecified: Secondary | ICD-10-CM | POA: Diagnosis not present

## 2020-07-20 DIAGNOSIS — J449 Chronic obstructive pulmonary disease, unspecified: Secondary | ICD-10-CM | POA: Insufficient documentation

## 2020-07-20 DIAGNOSIS — K219 Gastro-esophageal reflux disease without esophagitis: Secondary | ICD-10-CM | POA: Insufficient documentation

## 2020-07-20 DIAGNOSIS — Z9981 Dependence on supplemental oxygen: Secondary | ICD-10-CM | POA: Diagnosis not present

## 2020-07-20 DIAGNOSIS — Z79899 Other long term (current) drug therapy: Secondary | ICD-10-CM | POA: Diagnosis not present

## 2020-07-20 DIAGNOSIS — I10 Essential (primary) hypertension: Secondary | ICD-10-CM | POA: Diagnosis not present

## 2020-07-20 HISTORY — DX: Pneumonia, unspecified organism: J18.9

## 2020-07-20 HISTORY — DX: Cardiac arrhythmia, unspecified: I49.9

## 2020-07-20 LAB — CBC WITH DIFFERENTIAL/PLATELET
Abs Immature Granulocytes: 0.02 10*3/uL (ref 0.00–0.07)
Basophils Absolute: 0 10*3/uL (ref 0.0–0.1)
Basophils Relative: 0 %
Eosinophils Absolute: 0.1 10*3/uL (ref 0.0–0.5)
Eosinophils Relative: 1 %
HCT: 52.2 % — ABNORMAL HIGH (ref 39.0–52.0)
Hemoglobin: 17.5 g/dL — ABNORMAL HIGH (ref 13.0–17.0)
Immature Granulocytes: 0 %
Lymphocytes Relative: 25 %
Lymphs Abs: 2.1 10*3/uL (ref 0.7–4.0)
MCH: 29.6 pg (ref 26.0–34.0)
MCHC: 33.5 g/dL (ref 30.0–36.0)
MCV: 88.3 fL (ref 80.0–100.0)
Monocytes Absolute: 0.9 10*3/uL (ref 0.1–1.0)
Monocytes Relative: 11 %
Neutro Abs: 5.1 10*3/uL (ref 1.7–7.7)
Neutrophils Relative %: 63 %
Platelets: 276 10*3/uL (ref 150–400)
RBC: 5.91 MIL/uL — ABNORMAL HIGH (ref 4.22–5.81)
RDW: 13.1 % (ref 11.5–15.5)
WBC: 8.2 10*3/uL (ref 4.0–10.5)
nRBC: 0 % (ref 0.0–0.2)

## 2020-07-20 LAB — COMPREHENSIVE METABOLIC PANEL
ALT: 24 U/L (ref 0–44)
AST: 29 U/L (ref 15–41)
Albumin: 4.2 g/dL (ref 3.5–5.0)
Alkaline Phosphatase: 117 U/L (ref 38–126)
Anion gap: 8 (ref 5–15)
BUN: 7 mg/dL — ABNORMAL LOW (ref 8–23)
CO2: 31 mmol/L (ref 22–32)
Calcium: 9.6 mg/dL (ref 8.9–10.3)
Chloride: 98 mmol/L (ref 98–111)
Creatinine, Ser: 1.24 mg/dL (ref 0.61–1.24)
GFR, Estimated: >60 mL/min (ref >60)
Glucose, Bld: 88 mg/dL (ref 70–99)
Potassium: 3.2 mmol/L — ABNORMAL LOW (ref 3.5–5.1)
Sodium: 137 mmol/L (ref 135–145)
Total Bilirubin: 0.8 mg/dL (ref 0.3–1.2)
Total Protein: 7.4 g/dL (ref 6.5–8.1)

## 2020-07-20 NOTE — Pre-Procedure Instructions (Signed)
Surgical Instructions    Your procedure is scheduled on Tuesday, June 21st.  Report to [facility] Main Entrance "A" at 05:30 A.M., then check in with the Admitting office.  Call this number if you have problems the morning of surgery:  [phone]   If you have any questions prior to your surgery date call [phone]: Open Monday-Friday 8am-4pm    Remember:  Do not eat after midnight the night before your surgery.  You may drink clear liquids until 04:30 AM the morning of your surgery.   Clear liquids allowed are: Water, Non-Citrus Juices (without pulp), Carbonated Beverages, Clear Tea, Black Coffee Only, and Gatorade.    Take these medicines the morning of surgery with A SIP OF WATER: atorvastatin (LIPITOR) lansoprazole (PREVACID) BREZTRI AEROSPHERE inhaler  IF NEEDED: baclofen (LIORESAL) famotidine (PEPCID) HYDROcodone-acetaminophen (NORCO) levocetirizine (XYZAL) albuterol (VENTOLIN HFA) inhaler- Please bring with you the day of surgery.       As of today, STOP taking any Aspirin (unless otherwise instructed by your surgeon) Aleve, Naproxen, Ibuprofen, Motrin, Advil, Goody's, BC's, all herbal medications, fish oil, and all vitamins.           The Morning of Surgery: Do not wear jewelry. Do not wear lotions, powders, colognes, or deodorant. Men may shave face and neck. Do not bring valuables to the hospital.             [facility] is not responsible for any belongings or valuables.  Do NOT Smoke (Tobacco/Vaping) or drink Alcohol 24 hours prior to your procedure.  If you use a CPAP at night, you may bring all equipment for your overnight stay.   Contacts, glasses, dentures or bridgework may not be worn into surgery, please bring cases for these belongings   For patients admitted to the hospital, discharge time will be determined by your treatment team.   Patients discharged the day of surgery will not be allowed to drive home, and someone needs to stay with them  for 24 hours.  ONLY 1 SUPPORT PERSON MAY BE PRESENT WHILE YOU ARE IN SURGERY. IF YOU ARE TO BE ADMITTED ONCE YOU ARE IN YOUR ROOM YOU WILL BE ALLOWED TWO (2) VISITORS.  Minor children may have two parents present. Special consideration for safety and communication needs will be reviewed on a case by case basis.  Special instructions:    Oral Hygiene is also important to reduce your risk of infection.  Remember - BRUSH YOUR TEETH THE MORNING OF SURGERY WITH YOUR REGULAR TOOTHPASTE   [place]- Preparing For Surgery  Before surgery, you can play an important role. Because skin is not sterile, your skin needs to be as free of germs as possible. You can reduce the number of germs on your skin by washing with CHG (chlorahexidine gluconate) Soap before surgery.  CHG is an antiseptic cleaner which kills germs and bonds with the skin to continue killing germs even after washing.     Please do not use if you have an allergy to CHG or antibacterial soaps. If your skin becomes reddened/irritated stop using the CHG.  Do not shave (including legs and underarms) for at least 48 hours prior to first CHG shower. It is OK to shave your face.  Please follow these instructions carefully.     Shower the NIGHT BEFORE SURGERY and the MORNING OF SURGERY with CHG Soap.   If you chose to wash your hair, wash your hair first as usual with your normal shampoo. After you shampoo,  rinse your hair and body thoroughly to remove the shampoo.  Then [employer] and genitals (private parts) with your normal soap and rinse thoroughly to remove soap.  After that Use CHG Soap as you would any other liquid soap. You can apply CHG directly to the skin and wash gently with a scrungie or a clean washcloth.   Apply the CHG Soap to your body ONLY FROM THE NECK DOWN.  Do not use on open wounds or open sores. Avoid contact with your eyes, ears, mouth and genitals (private parts). Wash Face and genitals (private parts)  with your normal  soap.   Wash thoroughly, paying special attention to the area where your surgery will be performed.  Thoroughly rinse your body with warm water from the neck down.  DO NOT shower/wash with your normal soap after using and rinsing off the CHG Soap.  Pat yourself dry with a CLEAN TOWEL.  Wear CLEAN PAJAMAS to bed the night before surgery  Place CLEAN SHEETS on your bed the night before your surgery  DO NOT SLEEP WITH PETS.   Day of Surgery:  Take a shower with CHG soap. Wear Clean/Comfortable clothing the morning of surgery Do not apply any deodorants/lotions.   Remember to brush your teeth WITH YOUR REGULAR TOOTHPASTE.   Please read over the following fact sheets that you were given.

## 2020-07-20 NOTE — Progress Notes (Addendum)
PCP - Dr. [name] Cardiologist - Dr. Vernell Leep Pulmonology- Dr. Christinia Gully  PPM/ICD - denies   Chest x-ray -  EKG - 03/31/20 Stress Test - per pt over 10 years ago. ECHO - 03/25/20 Cardiac Cath - denies  Sleep Study - denies CPAP - n/a  Pt not a diabetic.  Blood Thinner Instructions: n/a Aspirin Instructions: n/a  ERAS Protcol - yes PRE-SURGERY Ensure or G2- no drink.  COVID TEST- scheduled at drive through for Monday 07/27/19 @ 1020am.   Anesthesia review: yes- pulmonary history, high BP in PAT appointment, hemoglobin 17.5.  Patient denies shortness of breath, fever, cough and chest pain at PAT appointment   All instructions explained to the patient, with a verbal understanding of the material. Patient agrees to go over the instructions while at home for a better understanding. Patient also instructed to self quarantine after being tested for COVID-19. The opportunity to ask questions was provided.

## 2020-07-20 NOTE — Progress Notes (Signed)
Elevated BP at PAT appointment: BP elevated. Per pt, he already took his BP meds today. Pt denies CP, dizziness, lightheadedness; positive for [facility], but pt states this is his baseline due to COPD. Anesthesia PA, Karoline Caldwell, PA-C notified about BP. No new interventions at this time as per [name], pt's BP has  been pretty well documented/ followed by Dr. Virgina Jock.    07/20/20 1415 07/20/20 1421 07/20/20 1440  Vitals  Temp 98.2 F (36.8 C)  --   --   Temp Source Oral  --   --   Pulse Rate (!) 147  --  (!) 144  Resp 19  --   --   BP (!) 141/99  --  (!) 154/108 (LEFT ARM)  SpO2 97 %  --   --   Pain Assessment  Pain Scale  --  0-10  --   Pain Score  --  5  --   Pain Type  --  Chronic pain  --   Pain Location  --  Back  --   Pain Orientation  --  Lower  --   Pain Descriptors / Indicators  --  Constant;Nagging  --   Pain Onset  --  On-going  --   Patients Stated Pain Goal  --  3  --     07/20/20 1444 07/20/20 1447  Vitals  Temp  --   --   Temp Source  --   --   Pulse Rate 68 72  Resp  --   --   BP (!) 157/138 (right arm) (!) 139/107  SpO2  --   --   Pain Assessment  Pain Scale  --   --   Pain Score  --   --   Pain Type  --   --   Pain Location  --   --   Pain Orientation  --   --   Pain Descriptors / Indicators  --   --   Pain Onset  --   --   Patients Stated Pain Goal  --   --

## 2020-07-20 NOTE — Progress Notes (Signed)
Labs ok for surgery. Adding oral potassium pre op.

## 2020-07-21 NOTE — Anesthesia Preprocedure Evaluation (Addendum)
Anesthesia Evaluation  Patient identified by MRN, date of birth, ID band Patient awake    Reviewed: Allergy & Precautions, NPO status , Patient's Chart, lab work & pertinent test results, reviewed documented beta blocker date and time   Airway Mallampati: I  TM Distance: >3 FB Neck ROM: Full    Dental  (+) Loose, Missing, Dental Advisory Given,    Pulmonary shortness of breath, COPD (2L O2 prn for activity ),  COPD inhaler and oxygen dependent, former smoker,  non-small cell carcinoma of right lung s/p lobectomy   Pulmonary exam normal breath sounds clear to auscultation       Cardiovascular hypertension, Pt. on home beta blockers and Pt. on medications + CAD  Normal cardiovascular exam+ dysrhythmias (paroxysmal tachycardia, likely atrial flutter) Atrial Fibrillation  Rhythm:Regular Rate:Tachycardia     Neuro/Psych  Headaches, PSYCHIATRIC DISORDERS Depression    GI/Hepatic Neg liver ROS, GERD  Controlled,  Endo/Other  negative endocrine ROS  Renal/GU negative Renal ROS  negative genitourinary   Musculoskeletal  (+) Arthritis ,   Abdominal   Peds  Hematology negative hematology ROS (+)   Anesthesia Other Findings Partial gastrectomy for dysplastic gastric body ulcer  CT Chest 12/23/19: IMPRESSION: 1. Postoperative findings of right lower lobe superior segmentectomy and wedge resection. No evidence of recurrent or metastatic disease in the chest. 2. Severe centrilobular emphysema. 3. Hepatic steatosis.   Mobile cardiac telemetry 6 days 03/17/2020 - 03/24/2020: Dominant rhythm: Sinus. HR 54-110 bpm. Avg HR 77 bpm. <1% isolated SVE, couplet <% isolated VE No atrial fibrillation/atrial flutter/SVT/VT/high grade AV block, sinus pause >3sec noted. 1 patient triggered event, correlated with artifact.  Echocardiogram 03/25/2020:  Left ventricle cavity is normal in size and wall thickness. Normal global  wall  motion. Normal LV systolic function with EF 65%. Normal diastolic  filling pattern.  No significant valvular abnormality.  normal right atrial pressure.  Reproductive/Obstetrics                          Anesthesia Physical Anesthesia Plan  ASA: 3  Anesthesia Plan: General   Post-op Pain Management:    Induction: Intravenous  PONV Risk Score and Plan: 2 and Midazolam, Dexamethasone and Ondansetron  Airway Management Planned: Oral ETT  Additional Equipment:   Intra-op Plan:   Post-operative Plan: Extubation in OR  Informed Consent: I have reviewed the patients History and Physical, chart, labs and discussed the procedure including the risks, benefits and alternatives for the proposed anesthesia with the patient or authorized representative who has indicated his/her understanding and acceptance.     Dental advisory given  Plan Discussed with: CRNA  Anesthesia Plan Comments: (2 IVs)      Anesthesia Quick Evaluation

## 2020-07-21 NOTE — Progress Notes (Addendum)
Anesthesia Chart Review:  Pertinent hx includes former smoker quit 2013 (35 pack year hx), COPD GOLD III, chronic respiratory failture, chronic sinusitis, non-small cell carcinoma of right lung s/p lobectomy, GERD, hypertension, hyperlipidemia, with paroxysmal tachycardia, likely atrial flutter.  Follows with cardiologist Dr. Virgina Jock. Last seen 03/31/20 and advised he was okay to proceed with upcoming endoscopy, which he had on 3/11. Procedure was initially postponed due recurrence of tachycardia on 04/13/20 and gastroenterologist Dr. Benson Norway reached out the Dr. Virgina Jock. Dr Virgina Jock commented on this in telephone encounter 04/13/20, "Yes, it is okay to give IV lopressor by anesthesia. He likely has a reentrant tachyarrhythmia, like atrial flutter or SVT. With his ongoing workup for potentially malignant diagnosis, I would not recommend invasive therapy like ablation. I will be available for consult, should he have any issues during the procedure."  Follows with pulmonologist Dr. Melvyn Novas. He is maintained on Breztri and albuterol PRN. He uses 2L supplemental O2 for moderate exertion.   Preop labs reviewed, unremarkable.   EKG 03/31/2020: Sinus rhythm 81 bpm. RSR(V1) -nondiagnostic  CT Chest 12/23/19: IMPRESSION: 1. Postoperative findings of right lower lobe superior segmentectomy and wedge resection. No evidence of recurrent or metastatic disease in the chest. 2. Severe centrilobular emphysema. 3. Hepatic steatosis.    Mobile cardiac telemetry 6 days 03/17/2020 - 03/24/2020: Dominant rhythm: Sinus. HR 54-110 bpm. Avg HR 77 bpm. <1% isolated SVE, couplet <% isolated VE No atrial fibrillation/atrial flutter/SVT/VT/high grade AV block, sinus pause >3sec noted. 1 patient triggered event, correlated with artifact.  Echocardiogram 03/25/2020:  Left ventricle cavity is normal in size and wall thickness. Normal global  wall motion. Normal LV systolic function with EF 65%. Normal diastolic  filling  pattern.  No significant valvular abnormality.  normal right atrial pressure.   PFT 05/22/17: FVC-%Pred-Pre Latest Units: % 74  FEV1-%Pred-Pre Latest Units: % 45  FEV1FVC-%Pred-Pre Latest Units: % 60  TLC % pred Latest Units: % 106  RV % pred Latest Units: % 162  DLCO unc % pred Latest Units: % [address]     Wynonia Musty [facility] Short Stay Center/Anesthesiology Phone [phone] 07/21/2020 3:05 PM

## 2020-07-26 ENCOUNTER — Other Ambulatory Visit (HOSPITAL_COMMUNITY)
Admission: RE | Admit: 2020-07-26 | Discharge: 2020-07-26 | Disposition: A | Discharge status: Home / Self Care | Payer: Medicare Other | Source: Ambulatory Visit | Attending: General Surgery | Admitting: General Surgery

## 2020-07-26 ENCOUNTER — Encounter (HOSPITAL_COMMUNITY): Payer: Self-pay | Admitting: General Surgery

## 2020-07-26 DIAGNOSIS — Z20822 Contact with and (suspected) exposure to covid-19: Secondary | ICD-10-CM | POA: Insufficient documentation

## 2020-07-26 DIAGNOSIS — Z01812 Encounter for preprocedural laboratory examination: Secondary | ICD-10-CM | POA: Insufficient documentation

## 2020-07-26 LAB — SARS CORONAVIRUS 2 (TAT 6-24 HRS): SARS Coronavirus 2: NEGATIVE

## 2020-07-26 NOTE — H&P (Signed)
Elijah Marshall Location: [place] Office Patient #: [id number] DOB: 26-Mar-1950 Single / Language: Cleophus Molt / Race: Black or African American Male   History of Present Illness  The patient is a 70 year old male who presents for a follow-up for Abnormal laboratory test. Pt is a 70 yo M who is referred by D.r Benson Norway due to a dysplastic gastric body ulcer.  He has a history of lung cancer and colon polyps, and he got a colonoscopy for screening and dyspepsia.  He was found to have an ulcer wtih probable high grade dysplasia. This did not improve on recheck.  He presents to discuss excision.  He denies abdominal pain, nausea, vomiting.  He hasn't had any significant weight loss.  He denies GI bleeding.  He has a family history of colon cancer in his mother.  He was supposed to get this tattooed, but he had SVT upon arrival to short stay so it was aborted.  He went to cardiology and was started on a rate controlling agent.  He has follow up last week.  He does have COPD and uses his rescue inhaler frequently.    initial procedure reports and path in [employer].     He returns after EUS and tattooing by Dr. Benson Norway.  He has not had any symptoms of ulcer.  He has not had any issues wtih his heart.  His wife is here with him today.  The new biopsy did show high grade dysplasia for sure.    EUS 3/11 One non-bleeding superficial gastric ulcer with no stigmata of bleeding was found on the greater curvature of the gastric body. The lesion was 4 mm in largest dimension. Biopsies were taken with a cold forceps for histology. Area was tattooed with an injection of 2 mL of Spot (carbon black). The gastric ulcer was again identified in the distal gastric body along the greater curvature. Cold biopsies were repeated. The right and left lateral portions were tattooed with SPOT. The evaluation of his pancreas was negative for any abnormalities.  pathology 04/16/2020 A. STOMACH, ULCER, BIOPSY: -  High-grade dysplasia,  focally suspicious for intramucosal adenocarcinoma   Allergies  Aspirin 81 *ANALGESICS - NonNarcotic*   Morphine Sulfate-NaCl *ANALGESICS - OPIOID*   Allergies Reconciled    Medication History hydroCHLOROthiazide  (25MG  Tablet, Oral) Active. Atorvastatin Calcium  (40MG  Tablet, Oral) Active. Famotidine  (40MG  Tablet, Oral) Active. Lansoprazole  (30MG  Capsule DR, Oral) Active. Levocetirizine Dihydrochloride  (5MG  Tablet, Oral) Active. MiraLax  (17GM/SCOOP Powder, Oral) Active. Medications Reconciled     Review of Systems All other systems negative  Vitals  Weight: 173.4 lb   Height: 66 in  Body Surface Area: 1.88 m   Body Mass Index: 27.99 kg/m   Temp.: 98.2 F    Pulse: 76 (Regular)    BP: 148/90(Sitting, Left Arm, Standard)       Physical Exam  General Mental Status - Alert. General Appearance - Consistent with stated age. Hydration - Well hydrated. Voice - Normal.  Head and Neck Head - normocephalic, atraumatic with no lesions or palpable masses.  Eye Sclera/Conjunctiva - Bilateral - No scleral icterus.  Chest and Lung Exam Chest and lung exam reveals  - quiet, even and easy respiratory effort with no use of accessory muscles. Inspection Chest Wall - Normal. Back - normal.  Breast - Did not examine.  Cardiovascular Cardiovascular examination reveals  - normal pedal pulses bilaterally. Note:  regular rate and rhythm  Abdomen Inspection - Inspection Normal. Palpation/Percussion Palpation  and Percussion of the abdomen reveal - Soft, Non Tender, No Rebound tenderness, No Rigidity (guarding) and No hepatosplenomegaly.  Peripheral Vascular Upper Extremity Inspection - Bilateral - Normal - No Clubbing, No Cyanosis, No Edema, Pulses Intact. Lower Extremity Palpation - Edema - Bilateral - No edema - Bilateral.  Neurologic Neurologic evaluation reveals  - alert and oriented x 3 with no impairment of recent or remote memory. Mental Status -  Normal.  Musculoskeletal Global Assessment  - Note:  no gross deformities.  Normal Exam - Left - Upper Extremity Strength Normal and Lower Extremity Strength Normal. Normal Exam - Right - Upper Extremity Strength Normal and Lower Extremity Strength Normal.  Lymphatic Head & Neck  General Head & Neck Lymphatics: Bilateral - Description - Normal. Axillary  General Axillary Region: Bilateral - Description - Normal. Tenderness - Non Tender.    Assessment & Plan  GASTRIC DYSPLASIA (Q40.3) Impression: Will set patient up for robotic excision of this ulcer. I would send the specimen for frozen. If any invasive cancer is found, I would convert to an open partial gastrectomy. I discussed that if I have to take a significant portion of the stomach out I would place a temporary feeding tube.  I reviewed surgery again with the patient with diagrams of anatomy.  I discussed risks with the patient including bleeding, infection, damage to adjacent structures, leak at gastric defect, nausea, possible return to OR or additional procedures, hernia, pain, heart/lung complications and death. He understands and wishes to proceed. CHRONIC GASTRIC ULCER (K25.7) Impression: see above. Current Plans You are being scheduled for surgery - Our schedulers will call you.   Name of surgery:  Robotic partial gastrectomy, possible open partial gastrectomy, possible feeding tube.    You should hear from our office's scheduling department within 5 working days about the location, date, and time of surgery.  We try to make accommodations for patient's preferences in scheduling surgery, but sometimes the OR schedule or the surgeon's schedule prevents [place] from making those accommodations.  If you have not heard from our office [phone]) in 5 working days, call the office and ask for your surgeon's nurse.  If you have other questions about your diagnosis, plan, or surgery, call the office and ask for your  surgeon's nurse.

## 2020-07-27 ENCOUNTER — Encounter (HOSPITAL_COMMUNITY)
Admission: RE | Disposition: A | Discharge status: Home / Self Care | Payer: Self-pay | Source: Home / Self Care | Attending: General Surgery

## 2020-07-27 ENCOUNTER — Inpatient Hospital Stay (HOSPITAL_COMMUNITY)
Admission: RE | Admit: 2020-07-27 | Discharge: 2020-07-30 | DRG: 328 | Disposition: A | Discharge status: Home / Self Care | Payer: Medicare Other | Attending: General Surgery | Admitting: General Surgery

## 2020-07-27 ENCOUNTER — Encounter (HOSPITAL_COMMUNITY): Payer: Self-pay | Admitting: General Surgery

## 2020-07-27 ENCOUNTER — Inpatient Hospital Stay (HOSPITAL_COMMUNITY): Payer: Medicare Other | Admitting: Physician Assistant

## 2020-07-27 ENCOUNTER — Other Ambulatory Visit: Payer: Self-pay

## 2020-07-27 DIAGNOSIS — J310 Chronic rhinitis: Secondary | ICD-10-CM | POA: Diagnosis present

## 2020-07-27 DIAGNOSIS — G4700 Insomnia, unspecified: Secondary | ICD-10-CM | POA: Diagnosis present

## 2020-07-27 DIAGNOSIS — K219 Gastro-esophageal reflux disease without esophagitis: Secondary | ICD-10-CM | POA: Diagnosis present

## 2020-07-27 DIAGNOSIS — Z800 Family history of malignant neoplasm of digestive organs: Secondary | ICD-10-CM

## 2020-07-27 DIAGNOSIS — Z79899 Other long term (current) drug therapy: Secondary | ICD-10-CM | POA: Diagnosis not present

## 2020-07-27 DIAGNOSIS — Z9981 Dependence on supplemental oxygen: Secondary | ICD-10-CM

## 2020-07-27 DIAGNOSIS — I10 Essential (primary) hypertension: Secondary | ICD-10-CM | POA: Diagnosis not present

## 2020-07-27 DIAGNOSIS — Q403 Congenital malformation of stomach, unspecified: Secondary | ICD-10-CM

## 2020-07-27 DIAGNOSIS — E785 Hyperlipidemia, unspecified: Secondary | ICD-10-CM | POA: Diagnosis not present

## 2020-07-27 DIAGNOSIS — R1013 Epigastric pain: Secondary | ICD-10-CM | POA: Diagnosis present

## 2020-07-27 DIAGNOSIS — G894 Chronic pain syndrome: Secondary | ICD-10-CM | POA: Diagnosis not present

## 2020-07-27 DIAGNOSIS — Z20822 Contact with and (suspected) exposure to covid-19: Secondary | ICD-10-CM | POA: Diagnosis not present

## 2020-07-27 DIAGNOSIS — Z902 Acquired absence of lung [part of]: Secondary | ICD-10-CM

## 2020-07-27 DIAGNOSIS — R112 Nausea with vomiting, unspecified: Secondary | ICD-10-CM

## 2020-07-27 DIAGNOSIS — I2510 Atherosclerotic heart disease of native coronary artery without angina pectoris: Secondary | ICD-10-CM | POA: Diagnosis not present

## 2020-07-27 DIAGNOSIS — Z881 Allergy status to other antibiotic agents status: Secondary | ICD-10-CM | POA: Diagnosis not present

## 2020-07-27 DIAGNOSIS — J449 Chronic obstructive pulmonary disease, unspecified: Secondary | ICD-10-CM | POA: Diagnosis not present

## 2020-07-27 DIAGNOSIS — R000 Tachycardia, unspecified: Secondary | ICD-10-CM | POA: Diagnosis not present

## 2020-07-27 DIAGNOSIS — K31A21 Gastric intestinal metaplasia with low grade dysplasia: Principal | ICD-10-CM | POA: Diagnosis present

## 2020-07-27 DIAGNOSIS — Z85118 Personal history of other malignant neoplasm of bronchus and lung: Secondary | ICD-10-CM

## 2020-07-27 DIAGNOSIS — K257 Chronic gastric ulcer without hemorrhage or perforation: Secondary | ICD-10-CM | POA: Diagnosis not present

## 2020-07-27 DIAGNOSIS — Z885 Allergy status to narcotic agent status: Secondary | ICD-10-CM | POA: Diagnosis not present

## 2020-07-27 DIAGNOSIS — I484 Atypical atrial flutter: Secondary | ICD-10-CM

## 2020-07-27 HISTORY — PX: ESOPHAGOGASTRODUODENOSCOPY: SHX5428

## 2020-07-27 LAB — CBC
HCT: 52.1 % — ABNORMAL HIGH (ref 39.0–52.0)
Hemoglobin: 17.1 g/dL — ABNORMAL HIGH (ref 13.0–17.0)
MCH: 29.5 pg (ref 26.0–34.0)
MCHC: 32.8 g/dL (ref 30.0–36.0)
MCV: 90 fL (ref 80.0–100.0)
Platelets: 231 10*3/uL (ref 150–400)
RBC: 5.79 MIL/uL (ref 4.22–5.81)
RDW: 13.4 % (ref 11.5–15.5)
WBC: 13.1 10*3/uL — ABNORMAL HIGH (ref 4.0–10.5)
nRBC: 0 % (ref 0.0–0.2)

## 2020-07-27 LAB — CREATININE, SERUM
Creatinine, Ser: 1.18 mg/dL (ref 0.61–1.24)
GFR, Estimated: >60 mL/min (ref >60)

## 2020-07-27 SURGERY — INSERTION, GASTROSTOMY TUBE, ROBOT-ASSISTED
Anesthesia: General | Site: Abdomen

## 2020-07-27 MED ORDER — PROCHLORPERAZINE EDISYLATE 10 MG/2ML IJ SOLN
5.0000 mg | Freq: Four times a day (QID) | INTRAMUSCULAR | Status: DC | PRN
Start: 2020-07-27 — End: 2020-07-30
  Administered 2020-07-28: 10 mg via INTRAVENOUS
  Filled 2020-07-27 (×2): qty 2

## 2020-07-27 MED ORDER — SENNA 8.6 MG PO TABS
1.0000 | ORAL_TABLET | Freq: Two times a day (BID) | ORAL | Status: DC
  Administered 2020-07-28 – 2020-07-30 (×4): 8.6 mg via ORAL
  Filled 2020-07-27 (×5): qty 1

## 2020-07-27 MED ORDER — PROPOFOL 10 MG/ML IV BOLUS
INTRAVENOUS | Status: AC
  Filled 2020-07-27: qty 40

## 2020-07-27 MED ORDER — HYDROCHLOROTHIAZIDE 25 MG PO TABS
25.0000 mg | ORAL_TABLET | Freq: Every day | ORAL | Status: DC
  Administered 2020-07-27 – 2020-07-30 (×4): 25 mg via ORAL
  Filled 2020-07-27 (×4): qty 1

## 2020-07-27 MED ORDER — ONDANSETRON HCL 4 MG/2ML IJ SOLN
4.0000 mg | Freq: Four times a day (QID) | INTRAMUSCULAR | Status: DC | PRN
  Administered 2020-07-27 – 2020-07-29 (×5): 4 mg via INTRAVENOUS
  Filled 2020-07-27 (×5): qty 2

## 2020-07-27 MED ORDER — KETAMINE HCL 10 MG/ML IJ SOLN
INTRAMUSCULAR | Status: DC | PRN
  Administered 2020-07-27: 20 mg via INTRAVENOUS

## 2020-07-27 MED ORDER — DIPHENHYDRAMINE HCL 50 MG/ML IJ SOLN: 12.5000 mg | Freq: Four times a day (QID) | INTRAMUSCULAR | Status: DC | PRN

## 2020-07-27 MED ORDER — ACETAMINOPHEN 500 MG PO TABS
1000.0000 mg | ORAL_TABLET | Freq: Four times a day (QID) | ORAL | Status: DC
  Administered 2020-07-27 – 2020-07-29 (×3): 1000 mg via ORAL
  Filled 2020-07-27 (×5): qty 2

## 2020-07-27 MED ORDER — PHENYLEPHRINE HCL-NACL 10-0.9 MG/250ML-% IV SOLN
INTRAVENOUS | Status: AC
  Filled 2020-07-27: qty 250

## 2020-07-27 MED ORDER — ROCURONIUM BROMIDE 10 MG/ML (PF) SYRINGE
PREFILLED_SYRINGE | INTRAVENOUS | Status: DC | PRN
  Administered 2020-07-27: 30 mg via INTRAVENOUS
  Administered 2020-07-27: 70 mg via INTRAVENOUS
  Administered 2020-07-27: 20 mg via INTRAVENOUS

## 2020-07-27 MED ORDER — LACTATED RINGERS IV SOLN: INTRAVENOUS | Status: DC

## 2020-07-27 MED ORDER — FENTANYL CITRATE (PF) 100 MCG/2ML IJ SOLN
25.0000 ug | INTRAMUSCULAR | Status: DC | PRN
  Administered 2020-07-27 (×2): 50 ug via INTRAVENOUS

## 2020-07-27 MED ORDER — CEFAZOLIN SODIUM-DEXTROSE 2-4 GM/100ML-% IV SOLN
2.0000 g | Freq: Three times a day (TID) | INTRAVENOUS | Status: AC
  Administered 2020-07-27: 2 g via INTRAVENOUS
  Filled 2020-07-27: qty 100

## 2020-07-27 MED ORDER — CHLORHEXIDINE GLUCONATE CLOTH 2 % EX PADS: 6.0000 | MEDICATED_PAD | Freq: Once | CUTANEOUS | Status: DC

## 2020-07-27 MED ORDER — ONDANSETRON HCL 4 MG/2ML IJ SOLN
INTRAMUSCULAR | Status: AC
  Filled 2020-07-27: qty 2

## 2020-07-27 MED ORDER — BUDESON-GLYCOPYRROL-FORMOTEROL 160-9-4.8 MCG/ACT IN AERO: 2.0000 | INHALATION_SPRAY | Freq: Two times a day (BID) | RESPIRATORY_TRACT | Status: DC

## 2020-07-27 MED ORDER — SUCCINYLCHOLINE CHLORIDE 200 MG/10ML IV SOSY
PREFILLED_SYRINGE | INTRAVENOUS | Status: AC
  Filled 2020-07-27: qty 10

## 2020-07-27 MED ORDER — CEFAZOLIN SODIUM-DEXTROSE 2-4 GM/100ML-% IV SOLN
INTRAVENOUS | Status: AC
  Filled 2020-07-27: qty 100

## 2020-07-27 MED ORDER — CHLORHEXIDINE GLUCONATE 0.12 % MT SOLN
OROMUCOSAL | Status: AC
  Administered 2020-07-27: 15 mL via OROMUCOSAL
  Filled 2020-07-27: qty 15

## 2020-07-27 MED ORDER — CHLORHEXIDINE GLUCONATE 0.12 % MT SOLN: 15.0000 mL | Freq: Once | OROMUCOSAL | Status: AC

## 2020-07-27 MED ORDER — LIDOCAINE 2% (20 MG/ML) 5 ML SYRINGE: INTRAMUSCULAR | Status: DC | PRN

## 2020-07-27 MED ORDER — METHOCARBAMOL 1000 MG/10ML IJ SOLN
500.0000 mg | Freq: Three times a day (TID) | INTRAVENOUS | Status: DC | PRN
  Filled 2020-07-27: qty 5

## 2020-07-27 MED ORDER — SUGAMMADEX SODIUM 500 MG/5ML IV SOLN
INTRAVENOUS | Status: DC | PRN
  Administered 2020-07-27: 300 mg via INTRAVENOUS

## 2020-07-27 MED ORDER — ENOXAPARIN SODIUM 40 MG/0.4ML IJ SOSY
40.0000 mg | PREFILLED_SYRINGE | INTRAMUSCULAR | Status: DC
  Administered 2020-07-28 – 2020-07-30 (×3): 40 mg via SUBCUTANEOUS
  Filled 2020-07-27 (×3): qty 0.4

## 2020-07-27 MED ORDER — BUPIVACAINE LIPOSOME 1.3 % IJ SUSP
INTRAMUSCULAR | Status: DC | PRN
  Administered 2020-07-27: 20 mL

## 2020-07-27 MED ORDER — FENTANYL CITRATE (PF) 100 MCG/2ML IJ SOLN
25.0000 ug | INTRAMUSCULAR | Status: DC | PRN
  Administered 2020-07-27 – 2020-07-29 (×6): 50 ug via INTRAVENOUS
  Filled 2020-07-27 (×7): qty 2

## 2020-07-27 MED ORDER — ONDANSETRON 4 MG PO TBDP: 4.0000 mg | ORAL_TABLET | Freq: Four times a day (QID) | ORAL | Status: DC | PRN

## 2020-07-27 MED ORDER — MOMETASONE FURO-FORMOTEROL FUM 100-5 MCG/ACT IN AERO
2.0000 | INHALATION_SPRAY | Freq: Two times a day (BID) | RESPIRATORY_TRACT | Status: DC
  Administered 2020-07-27 – 2020-07-30 (×7): 2 via RESPIRATORY_TRACT
  Filled 2020-07-27: qty 8.8

## 2020-07-27 MED ORDER — MIDAZOLAM HCL 2 MG/2ML IJ SOLN
INTRAMUSCULAR | Status: AC
  Filled 2020-07-27: qty 2

## 2020-07-27 MED ORDER — ACETAMINOPHEN 500 MG PO TABS
ORAL_TABLET | ORAL | Status: AC
  Administered 2020-07-27: 1000 mg via ORAL
  Filled 2020-07-27: qty 2

## 2020-07-27 MED ORDER — KETAMINE HCL 50 MG/5ML IJ SOSY
PREFILLED_SYRINGE | INTRAMUSCULAR | Status: AC
  Filled 2020-07-27: qty 5

## 2020-07-27 MED ORDER — FENTANYL CITRATE (PF) 100 MCG/2ML IJ SOLN
INTRAMUSCULAR | Status: DC | PRN
  Administered 2020-07-27 (×5): 50 ug via INTRAVENOUS

## 2020-07-27 MED ORDER — EPHEDRINE 5 MG/ML INJ
INTRAVENOUS | Status: AC
  Filled 2020-07-27: qty 10

## 2020-07-27 MED ORDER — ORAL CARE MOUTH RINSE: 15.0000 mL | Freq: Once | OROMUCOSAL | Status: AC

## 2020-07-27 MED ORDER — 0.9 % SODIUM CHLORIDE (POUR BTL) OPTIME
TOPICAL | Status: DC | PRN
  Administered 2020-07-27: 1000 mL

## 2020-07-27 MED ORDER — TRAMADOL HCL 50 MG PO TABS
50.0000 mg | ORAL_TABLET | Freq: Four times a day (QID) | ORAL | Status: DC | PRN
  Administered 2020-07-27 – 2020-07-29 (×2): 50 mg via ORAL
  Filled 2020-07-27 (×2): qty 1

## 2020-07-27 MED ORDER — DEXAMETHASONE SODIUM PHOSPHATE 10 MG/ML IJ SOLN
INTRAMUSCULAR | Status: AC
  Filled 2020-07-27: qty 1

## 2020-07-27 MED ORDER — PHENYLEPHRINE 40 MCG/ML (10ML) SYRINGE FOR IV PUSH (FOR BLOOD PRESSURE SUPPORT)
PREFILLED_SYRINGE | INTRAVENOUS | Status: DC | PRN
  Administered 2020-07-27: 200 ug via INTRAVENOUS
  Administered 2020-07-27: 80 ug via INTRAVENOUS
  Administered 2020-07-27: 200 ug via INTRAVENOUS
  Administered 2020-07-27: 80 ug via INTRAVENOUS

## 2020-07-27 MED ORDER — MIDAZOLAM HCL 5 MG/5ML IJ SOLN
INTRAMUSCULAR | Status: DC | PRN
  Administered 2020-07-27: 2 mg via INTRAVENOUS

## 2020-07-27 MED ORDER — BUPIVACAINE LIPOSOME 1.3 % IJ SUSP
INTRAMUSCULAR | Status: AC
  Filled 2020-07-27: qty 20

## 2020-07-27 MED ORDER — PANTOPRAZOLE SODIUM 40 MG IV SOLR
40.0000 mg | Freq: Every day | INTRAVENOUS | Status: DC
  Administered 2020-07-27 – 2020-07-28 (×2): 40 mg via INTRAVENOUS
  Filled 2020-07-27 (×2): qty 40

## 2020-07-27 MED ORDER — ACETAMINOPHEN 500 MG PO TABS: 1000.0000 mg | ORAL_TABLET | ORAL | Status: AC

## 2020-07-27 MED ORDER — IPRATROPIUM-ALBUTEROL 0.5-2.5 (3) MG/3ML IN SOLN: 3.0000 mL | RESPIRATORY_TRACT | Status: DC | PRN

## 2020-07-27 MED ORDER — LACTATED RINGERS IV SOLN: INTRAVENOUS | Status: DC | PRN

## 2020-07-27 MED ORDER — LIDOCAINE HCL 1 % IJ SOLN
INTRAMUSCULAR | Status: DC | PRN
  Administered 2020-07-27: 16 mL

## 2020-07-27 MED ORDER — DEXAMETHASONE SODIUM PHOSPHATE 10 MG/ML IJ SOLN
INTRAMUSCULAR | Status: DC | PRN
  Administered 2020-07-27: 4 mg via INTRAVENOUS

## 2020-07-27 MED ORDER — STERILE WATER FOR IRRIGATION IR SOLN
Status: DC | PRN
  Administered 2020-07-27: 1000 mL

## 2020-07-27 MED ORDER — LIDOCAINE IN D5W 4-5 MG/ML-% IV SOLN
1.0000 mg/min | INTRAVENOUS | Status: AC
  Administered 2020-07-27: 25 ug/kg/min via INTRAVENOUS
  Filled 2020-07-27: qty 500

## 2020-07-27 MED ORDER — PROPOFOL 10 MG/ML IV BOLUS
INTRAVENOUS | Status: DC | PRN
  Administered 2020-07-27: 50 mg via INTRAVENOUS
  Administered 2020-07-27: 100 mg via INTRAVENOUS

## 2020-07-27 MED ORDER — KCL IN DEXTROSE-NACL 20-5-0.45 MEQ/L-%-% IV SOLN
INTRAVENOUS | Status: DC
  Filled 2020-07-27 (×4): qty 1000

## 2020-07-27 MED ORDER — PHENYLEPHRINE HCL-NACL 10-0.9 MG/250ML-% IV SOLN
INTRAVENOUS | Status: DC | PRN
  Administered 2020-07-27: 25 ug/min via INTRAVENOUS

## 2020-07-27 MED ORDER — LIDOCAINE HCL (PF) 1 % IJ SOLN
INTRAMUSCULAR | Status: AC
  Filled 2020-07-27: qty 30

## 2020-07-27 MED ORDER — LIDOCAINE HCL (CARDIAC) PF 100 MG/5ML IV SOSY
PREFILLED_SYRINGE | INTRAVENOUS | Status: DC | PRN
  Administered 2020-07-27: 60 mg via INTRAVENOUS

## 2020-07-27 MED ORDER — CEFAZOLIN SODIUM-DEXTROSE 2-4 GM/100ML-% IV SOLN
2.0000 g | INTRAVENOUS | Status: AC
  Administered 2020-07-27: 2 g via INTRAVENOUS

## 2020-07-27 MED ORDER — FENTANYL CITRATE (PF) 250 MCG/5ML IJ SOLN
INTRAMUSCULAR | Status: AC
  Filled 2020-07-27: qty 5

## 2020-07-27 MED ORDER — PROCHLORPERAZINE MALEATE 10 MG PO TABS
10.0000 mg | ORAL_TABLET | Freq: Four times a day (QID) | ORAL | Status: DC | PRN
  Filled 2020-07-27: qty 1

## 2020-07-27 MED ORDER — UMECLIDINIUM BROMIDE 62.5 MCG/INH IN AEPB
1.0000 | INHALATION_SPRAY | Freq: Every day | RESPIRATORY_TRACT | Status: DC
  Administered 2020-07-27 – 2020-07-28 (×2): 1 via RESPIRATORY_TRACT
  Filled 2020-07-27: qty 7

## 2020-07-27 MED ORDER — DIPHENHYDRAMINE HCL 12.5 MG/5ML PO ELIX
12.5000 mg | ORAL_SOLUTION | Freq: Four times a day (QID) | ORAL | Status: DC | PRN
Start: 2020-07-27 — End: 2020-07-30

## 2020-07-27 MED ORDER — BUPIVACAINE-EPINEPHRINE 0.25% -1:200000 IJ SOLN
INTRAMUSCULAR | Status: DC | PRN
  Administered 2020-07-27: 16 mL

## 2020-07-27 MED ORDER — ROCURONIUM BROMIDE 10 MG/ML (PF) SYRINGE
PREFILLED_SYRINGE | INTRAVENOUS | Status: AC
  Filled 2020-07-27: qty 10

## 2020-07-27 MED ORDER — BUPIVACAINE-EPINEPHRINE (PF) 0.25% -1:200000 IJ SOLN
INTRAMUSCULAR | Status: AC
  Filled 2020-07-27: qty 30

## 2020-07-27 MED ORDER — ONDANSETRON HCL 4 MG/2ML IJ SOLN
INTRAMUSCULAR | Status: DC | PRN
  Administered 2020-07-27: 4 mg via INTRAVENOUS

## 2020-07-27 MED ORDER — FENTANYL CITRATE (PF) 100 MCG/2ML IJ SOLN
INTRAMUSCULAR | Status: AC
  Filled 2020-07-27: qty 2

## 2020-07-27 MED ORDER — PHENYLEPHRINE 40 MCG/ML (10ML) SYRINGE FOR IV PUSH (FOR BLOOD PRESSURE SUPPORT)
PREFILLED_SYRINGE | INTRAVENOUS | Status: AC
  Filled 2020-07-27: qty 10

## 2020-07-27 SURGICAL SUPPLY — 57 items
ADH SKN CLS APL DERMABOND .7 (GAUZE/BANDAGES/DRESSINGS) ×2
APL PRP STRL LF DISP 70% ISPRP (MISCELLANEOUS) ×2
BAG SPEC RTRVL 10 TROC 200 (ENDOMECHANICALS) ×4
CANNULA REDUC XI 12-8 STAPL (CANNULA) ×3
CANNULA REDUCER 12-8 DVNC XI (CANNULA) ×2 IMPLANT
CHLORAPREP W/TINT 26 (MISCELLANEOUS) ×3 IMPLANT
COVER MAYO STAND STRL (DRAPES) ×3 IMPLANT
COVER SURGICAL LIGHT HANDLE (MISCELLANEOUS) ×3 IMPLANT
DECANTER SPIKE VIAL GLASS SM (MISCELLANEOUS) ×3 IMPLANT
DEFOGGER SCOPE WARMER CLEARIFY (MISCELLANEOUS) ×3 IMPLANT
DERMABOND ADVANCED (GAUZE/BANDAGES/DRESSINGS) ×1
DERMABOND ADVANCED .7 DNX12 (GAUZE/BANDAGES/DRESSINGS) ×2 IMPLANT
DEVICE TROCAR PUNCTURE CLOSURE (ENDOMECHANICALS) ×2 IMPLANT
DRAPE ARM DVNC X/XI (DISPOSABLE) ×9 IMPLANT
DRAPE COLUMN DVNC XI (DISPOSABLE) ×2 IMPLANT
DRAPE CV SPLIT W-CLR ANES SCRN (DRAPES) ×3 IMPLANT
DRAPE DA VINCI XI ARM (DISPOSABLE) ×15
DRAPE DA VINCI XI COLUMN (DISPOSABLE) ×3
DRAPE ORTHO SPLIT 77X108 STRL (DRAPES) ×3
DRAPE SURG IRRIG POUCH 19X23 (DRAPES) ×3 IMPLANT
DRAPE SURG ORHT 6 SPLT 77X108 (DRAPES) ×2 IMPLANT
ELECT REM PT RETURN 9FT ADLT (ELECTROSURGICAL) ×3
ELECTRODE REM PT RTRN 9FT ADLT (ELECTROSURGICAL) ×2 IMPLANT
GLOVE BIO SURGEON STRL SZ 6 (GLOVE) ×9 IMPLANT
GLOVE SURG UNDER LTX SZ6.5 (GLOVE) ×9 IMPLANT
GOWN STRL REUS W/ TWL LRG LVL3 (GOWN DISPOSABLE) ×4 IMPLANT
GOWN STRL REUS W/TWL 2XL LVL3 (GOWN DISPOSABLE) ×9 IMPLANT
GOWN STRL REUS W/TWL LRG LVL3 (GOWN DISPOSABLE) ×6
KIT BASIN OR (CUSTOM PROCEDURE TRAY) ×3 IMPLANT
KIT TURNOVER KIT B (KITS) IMPLANT
NDL INSUFFLATION 14GA 120MM (NEEDLE) ×1 IMPLANT
NEEDLE 22X1 1/2 (OR ONLY) (NEEDLE) ×3 IMPLANT
NEEDLE HYPO 22GX1.5 SAFETY (NEEDLE) ×3 IMPLANT
NEEDLE INSUFFLATION 14GA 120MM (NEEDLE) ×3 IMPLANT
PAD ARMBOARD 7.5X6 YLW CONV (MISCELLANEOUS) ×6 IMPLANT
POUCH RETRIEVAL ECOSAC 10 (ENDOMECHANICALS) ×3 IMPLANT
POUCH RETRIEVAL ECOSAC 10MM (ENDOMECHANICALS) ×6
SCISSORS LAP 5X35 DISP (ENDOMECHANICALS) IMPLANT
SEAL CANN UNIV 5-8 DVNC XI (MISCELLANEOUS) ×6 IMPLANT
SEAL XI 5MM-8MM UNIVERSAL (MISCELLANEOUS) ×9
SEALER VESSEL DA VINCI XI (MISCELLANEOUS) ×3
SEALER VESSEL EXT DVNC XI (MISCELLANEOUS) ×2 IMPLANT
SET IRRIG TUBING LAPAROSCOPIC (IRRIGATION / IRRIGATOR) ×3 IMPLANT
SET TUBE SMOKE EVAC HIGH FLOW (TUBING) ×3 IMPLANT
SLEEVE XCEL OPT CAN 5 100 (ENDOMECHANICALS) IMPLANT
STAPLER 60 DA VINCI SURE FORM (STAPLE)
STAPLER 60 SUREFORM DVNC (STAPLE) ×1 IMPLANT
STAPLER CANNULA SEAL DVNC XI (STAPLE) IMPLANT
STAPLER CANNULA SEAL XI (STAPLE)
STOPCOCK 4 WAY LG BORE MALE ST (IV SETS) ×3 IMPLANT
SUT DVC VLOC 180 0 12IN GS21 (SUTURE) ×3
SUT MNCRL AB 4-0 PS2 18 (SUTURE) ×5 IMPLANT
SUTURE DVC VLC 180 0 12IN GS21 (SUTURE) ×1 IMPLANT
TOWEL GREEN STERILE FF (TOWEL DISPOSABLE) ×3 IMPLANT
TRAY FOLEY MTR SLVR 16FR STAT (SET/KITS/TRAYS/PACK) ×3 IMPLANT
TRAY LAPAROSCOPIC MC (CUSTOM PROCEDURE TRAY) ×3 IMPLANT
TROCAR ADV FIXATION 5X100MM (TROCAR) ×3 IMPLANT

## 2020-07-27 NOTE — Anesthesia Postprocedure Evaluation (Signed)
Anesthesia Post Note  Patient: KELEN LAURA  Procedure(s) Performed: ROBOTIC PARTIAL GASTRECTOMY (Abdomen) ESOPHAGOGASTRODUODENOSCOPY (EGD) (Abdomen)     Patient location during evaluation: PACU Anesthesia Type: General Level of consciousness: awake and alert Pain management: pain level controlled Vital Signs Assessment: post-procedure vital signs reviewed and stable Respiratory status: spontaneous breathing, nonlabored ventilation, respiratory function stable and patient connected to nasal cannula oxygen Cardiovascular status: blood pressure returned to baseline and stable Postop Assessment: no apparent nausea or vomiting Anesthetic complications: no   No notable events documented.  Last Vitals:  Vitals:   07/27/20 1210 07/27/20 1251  BP: (!) 144/97 (!) 136/99  Pulse: (!) 122 (!) 124  Resp: 15 18  Temp: 36.4 C 36.6 C  SpO2: 94%     Last Pain:  Vitals:   07/27/20 1210  TempSrc:   PainSc: 9                  Murlin Schrieber L Siennah Barrasso

## 2020-07-27 NOTE — Interval H&P Note (Signed)
History and Physical Interval Note:  07/27/2020 7:37 AM  Elijah Marshall  has presented today for surgery, with the diagnosis of gastric dysplasia.  The various methods of treatment have been discussed with the patient and family. After consideration of risks, benefits and other options for treatment, the patient has consented to  Procedure(s): ROBOTIC PARTIAL GASTRECTOMY, POSSIBLE FEEDING TUBE (N/A) POSSIUBLE OPEN PARTIAL GASTRECTOMY (N/A) as a surgical intervention.  The patient's history has been reviewed, patient examined, no change in status, stable for surgery.  I have reviewed the patient's chart and labs.  Questions were answered to the patient's satisfaction.     Stark Klein

## 2020-07-27 NOTE — Anesthesia Procedure Notes (Signed)
Procedure Name: Intubation Date/Time: 07/27/2020 7:51 AM Performed by: Lowella Dell, CRNA Pre-anesthesia Checklist: Patient identified, Emergency Drugs available, Suction available and Patient being monitored Patient Re-evaluated:Patient Re-evaluated prior to induction Oxygen Delivery Method: Circle system utilized Preoxygenation: Pre-oxygenation with 100% oxygen Induction Type: IV induction Ventilation: Mask ventilation without difficulty and Oral airway inserted - appropriate to patient size Laryngoscope Size: Mac and 4 Grade View: Grade I Tube type: Oral Number of attempts: 1 Airway Equipment and Method: Stylet Placement Confirmation: ETT inserted through vocal cords under direct vision, positive ETCO2 and breath sounds checked- equal and bilateral Secured at: 22 cm Tube secured with: Tape Dental Injury: Teeth and Oropharynx as per pre-operative assessment

## 2020-07-27 NOTE — Op Note (Signed)
PRE-OPERATIVE DIAGNOSIS: gastric dysplasia  POST-OPERATIVE DIAGNOSIS:  Same  PROCEDURE:  Procedure(s): Robotic partial gastrectomy  SURGEON:  Surgeon(s): Stark Klein, MD  ASSISTANT: Pryor Curia, RNFA  ANESTHESIA:   local and general  DRAINS: none   LOCAL MEDICATIONS USED:  MARCAINE   , LIDOCAINE , and exparel  SPECIMEN:  Source of Specimen:  partial gastrectomy  DISPOSITION OF SPECIMEN:  PATHOLOGY  COUNTS:  YES  DICTATION: .Dragon Dictation  PLAN OF CARE: Admit to inpatient   PATIENT DISPOSITION:  PACU - hemodynamically stable.  FINDINGS:  tattoos visible on greater curve and posterior stomach  EBL: min  PROCEDURE:   Patient was identified in the holding area and taken to the operating room where he was placed supine on the operating room table.  Sequential compression devices were placed on his lower extremities.  General anesthesia was induced.  A Foley catheter was placed and his arms were tucked.  The abdomen was then prepped and draped in sterile fashion.  Timeout was performed according to the surgical safety checklist.  When all was correct, we continued.  Patient was placed into reverse Trendelenburg position and rotated to the right.  A small skin nick was made with a #11 blade after administration of local.  The Veress needle was inserted and pneumoperitoneum was achieved to a pressure of 15 mmHg.  Local was then infiltrated into the supraumbilical skin and a vertical incision was made.  A 12 mm trocar was inserted in this location.  This reducer cap was placed.  one 8 mm port was placed in the right upper quadrant and 2 8 mm robotic ports were placed in the left abdomen.  An assistant port was placed in the right lower quadrant.    The robot was then docked.  A tip up fenestrated grasper was used in the far left port to hold up the left lateral segment gently.  The fenestrated bipolar and the vessel sealer were used to mobilize the greater curve of the  stomach.  The tattoos were seen immediately.  These were on the greater curve.  Dr. Benson Norway had tattooed adjacent to 4 edges of the lesion.  The omentum was taken down of the lesser curve adjacent to the tattoo.  The lesser sac was opened and additional tattooing was seen on the posterior aspect of the stomach.  This was slightly more distal than the length of anticipated so a stapler resection was not utilized in order to minimize the narrowing of the stomach.  Cautery was used to open the stomach at the most proximal tattoo directly on the greater curve.  A circular suction was taken with the cautery just inside all of the tattoos.  The inside of the stomach was evaluated and no additional ulcers or polyps seen in this location.  Once the specimen was completely disconnected, it was placed Ecosac and retrieved from the abdomen.  This was examined visually.  There was what appeared to be a polyp in this location, but no ulcer was seen.  This was sent for frozen section.  While awaiting the frozen section, attention was directed to the gastric defect.  This was closed in a transverse fashion to avoid narrowing the antrum too much.  A 2-0  VLock suture was used to close this defect in a running Marshall fashion.  The frozen section returned as no evidence of high-grade dysplasia or invasive adenocarcinoma.  There was intestinal metaplasia and low-grade dysplasia present.  Dr. Benson Norway came to do an endoscopy  to make sure that we have not missed any other lesion.  He will dictate this procedure.  There was no evidence of any other abnormalities in the stomach.  Additionally, the repair was airtight and was not disrupted with distention of the stomach.  Dr. Benson Norway was able to traverse the repair and get into the duodenum.  Once this was completed, the stomach was suctioned out.  The upper abdomen was reexamined for any evidence of bleeding.  There was none seen.  There was no evidence of other gross pathology on a  four-quadrant inspection.  A portion of the omentum that had been freed up during mobilization was laid over the top of the repair.  The 0 Vicryl in Endo close were used to repair the 12 mm fascial defect at the umbilicus.  The pneumoperitoneum was then allowed to evacuate.  Exparel was infiltrated into all of the incisions.  The incisions were then closed with 4-0 Monocryl in subcuticular fashion.  The wounds were then cleaned, dried, and dressed with Dermabond.  The patient was allowed to emerge from anesthesia and was taken to the PACU in stable condition.  Needle, sponge, and instrument counts were correct per protocol.

## 2020-07-27 NOTE — Progress Notes (Signed)
Upon arrival to Short Stay patient's BP 166/117 and pulse rate 144. Recheck BP 161/116 and pulse rate 142. Dr. Renaldo Reel with anesthesia made aware and gave a verbal order for an EKG. EKG done and Dr. Marcie Bal made aware of results. Per Dr. Marcie Bal, Dr. Lanetta Inch with anesthesia will assess patient shortly and decide how to proceed.  BP at 0618- 148/106. Pulse rate 139.

## 2020-07-27 NOTE — Progress Notes (Signed)
   07/27/20 1251  Assess: MEWS Score  Temp 97.9 F (36.6 C)  BP (!) 136/99  Pulse Rate (!) 124  Resp 18  Level of Consciousness Alert  Assess: MEWS Score  MEWS Temp 0  MEWS Systolic 0  MEWS Pulse 2  MEWS RR 0  MEWS LOC 0  MEWS Score 2  MEWS Score Color Yellow  Treat  MEWS Interventions Administered prn meds/treatments  Pain Scale 0-10  Pain Score 8  Pain Type Surgical pain  Pain Location Abdomen  Pain Orientation Anterior  Pain Intervention(s) Medication (See eMAR)  Multiple Pain Sites No  Notify: Provider  Provider Name/Title Byerly  Date Provider Notified 07/27/20  Time Provider Notified 1200  Notification Type  (per Robin PACU RN)  Notification Reason Other (Comment) (PACU RN Shirlean Mylar informed me that Dr. Barry Dienes was made aware of the v/s prior to transfer.)  Document  Patient Outcome Other (Comment) (Patient stable - will continue to monitor.)  Progress note created (see row info) Yes

## 2020-07-27 NOTE — Progress Notes (Signed)
Elijah Marshall is a 70 y.o. male patient admitted. Awake, alert - oriented  X 4 - no acute distress noted.  VSS -    Blood pressure 136/99, pulse 124, resp. Rate 18, height 5'9", weight 75.8kg, SpO2 98% on 3L.  IV in place, occlusive dsg intact without redness.   Orientation to room, and floor completed.  Admission INP armband ID verified with patient/family, and in place.   SR up x 2, fall assessment complete, with patient and family able to verbalize understanding of risk associated with falls, and verbalized understanding to call nsg before up out of bed.  Call light within reach, patient able to voice, and demonstrate understanding. No evidence of skin break down noted on exam.  Admission nurse notified of admission.     Will cont to eval and treat per MD orders.  Gordy Savers, RN 07/27/2020 12:50PM

## 2020-07-27 NOTE — Progress Notes (Signed)
Called and notified Dr. Levy Sjogren with anesthesia that patient's BP is now 150/98 and heart rate is 92.

## 2020-07-27 NOTE — Transfer of Care (Signed)
Immediate Anesthesia Transfer of Care Note  Patient: Elijah Marshall  Procedure(s) Performed: ROBOTIC PARTIAL GASTRECTOMY (Abdomen) ESOPHAGOGASTRODUODENOSCOPY (EGD) (Abdomen)  Patient Location: PACU  Anesthesia Type:General  Level of Consciousness: drowsy, patient cooperative and responds to stimulation  Airway & Oxygen Therapy: Patient Spontanous Breathing and Patient connected to nasal cannula oxygen  Post-op Assessment: Report given to RN and Post -op Vital signs reviewed and stable  Post vital signs: Reviewed and stable  Last Vitals:  Vitals Value Taken Time  BP 107/95 07/27/20 1055  Temp    Pulse 80 07/27/20 1057  Resp 13 07/27/20 1058  SpO2 97 % 07/27/20 1057  Vitals shown include unvalidated device data.  Last Pain:  Vitals:   07/27/20 0619  TempSrc:   PainSc: 5       Patients Stated Pain Goal: 3 (23/34/35 6861)  Complications: No notable events documented.

## 2020-07-28 ENCOUNTER — Encounter (HOSPITAL_COMMUNITY): Payer: Self-pay | Admitting: General Surgery

## 2020-07-28 LAB — BASIC METABOLIC PANEL
Anion gap: 9 (ref 5–15)
Anion gap: 7 (ref 5–15)
BUN: 9 mg/dL (ref 8–23)
BUN: 8 mg/dL (ref 8–23)
CO2: 26 mmol/L (ref 22–32)
CO2: 29 mmol/L (ref 22–32)
Calcium: 8.8 mg/dL — ABNORMAL LOW (ref 8.9–10.3)
Calcium: 8.9 mg/dL (ref 8.9–10.3)
Chloride: 99 mmol/L (ref 98–111)
Chloride: 98 mmol/L (ref 98–111)
Creatinine, Ser: 1.16 mg/dL (ref 0.61–1.24)
Creatinine, Ser: 1.14 mg/dL (ref 0.61–1.24)
GFR, Estimated: >60 mL/min (ref >60)
GFR, Estimated: >60 mL/min (ref >60)
Glucose, Bld: 121 mg/dL — ABNORMAL HIGH (ref 70–99)
Glucose, Bld: 106 mg/dL — ABNORMAL HIGH (ref 70–99)
Potassium: 4.1 mmol/L (ref 3.5–5.1)
Potassium: 3.8 mmol/L (ref 3.5–5.1)
Sodium: 134 mmol/L — ABNORMAL LOW (ref 135–145)
Sodium: 134 mmol/L — ABNORMAL LOW (ref 135–145)

## 2020-07-28 LAB — CBC
HCT: 41.5 % (ref 39.0–52.0)
Hemoglobin: 14.1 g/dL (ref 13.0–17.0)
MCH: 29.9 pg (ref 26.0–34.0)
MCHC: 34 g/dL (ref 30.0–36.0)
MCV: 88.1 fL (ref 80.0–100.0)
Platelets: 202 10*3/uL (ref 150–400)
RBC: 4.71 MIL/uL (ref 4.22–5.81)
RDW: 13.2 % (ref 11.5–15.5)
WBC: 12.4 10*3/uL — ABNORMAL HIGH (ref 4.0–10.5)
nRBC: 0 % (ref 0.0–0.2)

## 2020-07-28 LAB — MAGNESIUM: Magnesium: 1.7 mg/dL (ref 1.7–2.4)

## 2020-07-28 LAB — PHOSPHORUS: Phosphorus: 3.5 mg/dL (ref 2.5–4.6)

## 2020-07-28 MED ORDER — HYDROCODONE-ACETAMINOPHEN 7.5-325 MG PO TABS
1.0000 | ORAL_TABLET | Freq: Four times a day (QID) | ORAL | Status: DC | PRN
  Administered 2020-07-28 – 2020-07-30 (×7): 1 via ORAL
  Filled 2020-07-28 (×7): qty 1

## 2020-07-28 MED ORDER — LORATADINE 10 MG PO TABS: 10.0000 mg | ORAL_TABLET | Freq: Every day | ORAL | Status: DC | PRN

## 2020-07-28 MED ORDER — ALUM & MAG HYDROXIDE-SIMETH 200-200-20 MG/5ML PO SUSP
30.0000 mL | ORAL | Status: DC | PRN
  Administered 2020-07-28 – 2020-07-30 (×2): 30 mL via ORAL
  Filled 2020-07-28 (×2): qty 30

## 2020-07-28 MED ORDER — POLYETHYLENE GLYCOL 3350 17 G PO PACK
17.0000 g | PACK | Freq: Every day | ORAL | Status: DC
  Administered 2020-07-29 – 2020-07-30 (×2): 17 g via ORAL
  Filled 2020-07-28 (×2): qty 1

## 2020-07-28 MED ORDER — METOPROLOL TARTRATE 5 MG/5ML IV SOLN
5.0000 mg | Freq: Four times a day (QID) | INTRAVENOUS | Status: DC | PRN
  Administered 2020-07-29: 5 mg via INTRAVENOUS
  Filled 2020-07-28: qty 5

## 2020-07-28 NOTE — Progress Notes (Signed)
1 Day Post-Op   Subjective/Chief Complaint: Placed on telemetry due to being tachycardic initially post op.  This improved.  He had nausea last night.  He is quite sore and didn't have good relief with tramadol.  He had several doses of fentanyl, but this didn't last long.     Objective: Vital signs in last 24 hours: Temp:  [97.2 F (36.2 C)-98.4 F (36.9 C)] 98.4 F (36.9 C) (06/22 0019) Pulse Rate:  [75-124] 75 (06/22 0019) Resp:  [10-20] 20 (06/21 2019) BP: (107-144)/(70-100) 109/70 (06/22 0019) SpO2:  [85 %-99 %] 97 % (06/22 0019) Last BM Date: 07/26/20  Intake/Output from previous day: 06/21 0701 - 06/22 0700 In: 1800 [I.V.:1700; IV Piggyback:100] Out: 425 [Urine:425] Intake/Output this shift: No intake/output data recorded.  General appearance: alert, cooperative, and mild distress Resp: breathing comfortably GI: soft, mildly distended, approp tender.   Extremities: extremities normal, atraumatic, no cyanosis or edema  Lab Results:  Recent Labs    07/27/20 1307 07/28/20 0233  WBC 13.1* 12.4*  HGB 17.1* 14.1  HCT 52.1* 41.5  PLT 231 202   BMET Recent Labs    07/27/20 1307 07/28/20 0233  NA  --  134*  K  --  4.1  CL  --  99  CO2  --  26  GLUCOSE  --  121*  BUN  --  9  CREATININE 1.18 1.16  CALCIUM  --  8.8*   PT/INR No results for input(s): LABPROT, INR in the last 72 hours. ABG No results for input(s): PHART, HCO3 in the last 72 hours.  Invalid input(s): PCO2, PO2  Studies/Results: No results found.  Anti-infectives: Anti-infectives (From admission, onward)    Start     Dose/Rate Route Frequency Ordered Stop   07/27/20 1600  ceFAZolin (ANCEF) IVPB 2g/100 mL premix        2 g 200 mL/hr over 30 Minutes Intravenous Every 8 hours 07/27/20 1250 07/27/20 1730   07/27/20 0600  ceFAZolin (ANCEF) IVPB 2g/100 mL premix        2 g 200 mL/hr over 30 Minutes Intravenous On call to O.R. 07/27/20 0547 07/27/20 0805   07/27/20 0550  ceFAZolin (ANCEF) 2-4  GM/100ML-% IVPB       Note to Pharmacy: Wendall Mola   : cabinet override      07/27/20 0550 07/27/20 0821       Assessment/Plan: s/p Procedure(s): ROBOTIC PARTIAL GASTRECTOMY (N/A) ESOPHAGOGASTRODUODENOSCOPY (EGD) (N/A) Full liquids today Pain control PT for mobility assessment Oral antihypertensives.  Await final pathology. Dispo home pending nausea improvement. May get upper GI if still has a lot of nausea today   LOS: 1 day   Milus Height, MD FACS Surgical Oncology, General Surgery, Trauma and [facility], [place] for weekday/non holidays Check [email or web address] for coverage night/weekend/holidays  Do not use SecureChat as it is not reliable for timely patient care.

## 2020-07-28 NOTE — Progress Notes (Signed)
Pt's HR sustaining 135-140's. Pt resting on bed. Denies CP, VS as follows BP=136/84, T=99.2 orally, RR=19, O2=95% on 2.5 L of oxygen via [place], HR=139. EKG done showing atrial tach. MD on call made aware, see new orders.

## 2020-07-28 NOTE — Progress Notes (Signed)
Pt HR back to NSR with the rate of 82. Will continue to monitor pt.

## 2020-07-28 NOTE — Evaluation (Addendum)
Physical Therapy Evaluation and Discharge Patient Details Name: Elijah Marshall MRN: [id number] DOB: [birth year] Today's Date: 07/28/2020   History of Present Illness  Pt is a 70 y.o. 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 Significant PMH: lung CA, COPD, atrial flutter.  Clinical Impression  Patient evaluated by Physical Therapy with no further acute PT needs identified. Pt reports nausea and abdominal pain; RN premedicated prior to session. Pt ambulating x 150 feet with no assistive device without physical difficulty. During walk, desaturation to 78% on 3L O2; rebounded to > 88% on 4-6 L O2 during mobility. Returned to 2.5 L once seated where saturations maintained > 90%. Educated pt on ambulating 3x/daily with staff. All education has been completed and the patient has no further questions. No follow-up Physical Therapy or equipment needs. PT is signing off. Thank you for this referral.     Follow Up Recommendations No PT follow up    Equipment Recommendations  None recommended by PT    Recommendations for Other Services       Precautions / Restrictions Precautions Precautions: Other (comment) Precaution Comments: watch O2 Restrictions Weight Bearing Restrictions: No      Mobility  Bed Mobility Overal bed mobility: Modified Independent             General bed mobility comments: Cues for log roll technique for pain control    Transfers Overall transfer level: Independent Equipment used: None                Ambulation/Gait Ambulation/Gait assistance: Modified independent (Device/Increase time) Gait Distance (Feet): 150 Feet Assistive device: None Gait Pattern/deviations: WFL(Within Functional Limits)        Stairs            Wheelchair Mobility    Modified Rankin (Stroke Patients Only)       Balance Overall balance assessment: No apparent balance deficits (not formally assessed)                                            Pertinent Vitals/Pain Pain Assessment: Faces Faces Pain Scale: Hurts little more Pain Location: abdomen Pain Descriptors / Indicators: Grimacing;Guarding Pain Intervention(s): Monitored during session    Home Living Family/patient expects to be discharged to:: Private residence Living Arrangements: Spouse/significant other Available Help at Discharge: Family Type of Home: House Home Access: Stairs to enter   Technical brewer of Steps: 5 Home Layout: One level Home Equipment: Environmental consultant - 2 wheels;Cane - single point      Prior Function Level of Independence: Independent         Comments: Retired     Journalist, newspaper        Extremity/Trunk Assessment   Upper Extremity Assessment Upper Extremity Assessment: Overall WFL for tasks assessed    Lower Extremity Assessment Lower Extremity Assessment: Overall WFL for tasks assessed    Cervical / Trunk Assessment Cervical / Trunk Assessment: Normal  Communication   Communication: No difficulties  Cognition Arousal/Alertness: Awake/alert Behavior During Therapy: WFL for tasks assessed/performed Overall Cognitive Status: Within Functional Limits for tasks assessed                                        General Comments      Exercises  Assessment/Plan    PT Assessment Patent does not need any further PT services  PT Problem List         PT Treatment Interventions      PT Goals (Current goals can be found in the Care Plan section)  Acute Rehab PT Goals Patient Stated Goal: less nausea PT Goal Formulation: All assessment and education complete, DC therapy    Frequency     Barriers to discharge        Co-evaluation               AM-PAC PT "6 Clicks" Mobility  Outcome Measure Help needed turning from your back to your side while in a flat bed without using bedrails?: None Help needed moving from lying on your back to sitting on the side of a  flat bed without using bedrails?: None Help needed moving to and from a bed to a chair (including a wheelchair)?: None Help needed standing up from a chair using your arms (e.g., wheelchair or bedside chair)?: None Help needed to walk in hospital room?: None Help needed climbing 3-5 steps with a railing? : A Little 6 Click Score: 23    End of Session   Activity Tolerance: Patient tolerated treatment well Patient left: in chair;with call bell/phone within reach Nurse Communication: Mobility status PT Visit Diagnosis: Pain Pain - part of body:  (abdomen)    Time: 1448-1856 PT Time Calculation (min) (ACUTE ONLY): 22 min   Charges:   PT Evaluation $PT Eval Low Complexity: [place], PT, DPT Acute Rehabilitation Services Pager [phone] Office [phone]   Elijah Marshall 07/28/2020, 1:21 PM

## 2020-07-29 ENCOUNTER — Inpatient Hospital Stay (HOSPITAL_COMMUNITY): Payer: Medicare Other

## 2020-07-29 LAB — CBC
HCT: 45.5 % (ref 39.0–52.0)
Hemoglobin: 14.9 g/dL (ref 13.0–17.0)
MCH: 29.4 pg (ref 26.0–34.0)
MCHC: 32.7 g/dL (ref 30.0–36.0)
MCV: 89.7 fL (ref 80.0–100.0)
Platelets: 207 10*3/uL (ref 150–400)
RBC: 5.07 MIL/uL (ref 4.22–5.81)
RDW: 13.5 % (ref 11.5–15.5)
WBC: 10.1 10*3/uL (ref 4.0–10.5)
nRBC: 0 % (ref 0.0–0.2)

## 2020-07-29 MED ORDER — PANTOPRAZOLE SODIUM 40 MG PO TBEC
40.0000 mg | DELAYED_RELEASE_TABLET | Freq: Every day | ORAL | Status: DC
  Administered 2020-07-29: 40 mg via ORAL
  Filled 2020-07-29: qty 1

## 2020-07-29 MED ORDER — METOPROLOL TARTRATE 25 MG PO TABS
25.0000 mg | ORAL_TABLET | Freq: Two times a day (BID) | ORAL | Status: DC
  Administered 2020-07-29 – 2020-07-30 (×3): 25 mg via ORAL
  Filled 2020-07-29 (×3): qty 1

## 2020-07-29 MED ORDER — HYDROCODONE-ACETAMINOPHEN 5-325 MG PO TABS: 1.0000 | ORAL_TABLET | ORAL | 0 refills | Status: DC | PRN

## 2020-07-29 MED ORDER — DIATRIZOATE MEGLUMINE & SODIUM 66-10 % PO SOLN
120.0000 mL | Freq: Once | ORAL | Status: AC
  Administered 2020-07-29: 100 mL via ORAL
  Filled 2020-07-29: qty 120

## 2020-07-29 MED ORDER — MAGNESIUM SULFATE 2 GM/50ML IV SOLN
2.0000 g | Freq: Once | INTRAVENOUS | Status: AC
  Administered 2020-07-29: 2 g via INTRAVENOUS
  Filled 2020-07-29: qty 50

## 2020-07-29 MED ORDER — HYDROCODONE-ACETAMINOPHEN 5-325 MG PO TABS: 1.0000 | ORAL_TABLET | ORAL | 0 refills | Status: AC | PRN

## 2020-07-29 NOTE — Progress Notes (Signed)
2 Days Post-Op   Subjective/Chief Complaint: Threw up yesterday.  Drinking ok and urinating often   Objective: Vital signs in last 24 hours: Temp:  [98.1 F (36.7 C)-99.2 F (37.3 C)] 98.3 F (36.8 C) (06/23 0559) Pulse Rate:  [79-139] 86 (06/23 0559) Resp:  [16-19] 17 (06/23 0559) BP: (123-137)/(73-90) 137/85 (06/23 0559) SpO2:  [93 %-98 %] 93 % (06/23 0835) Last BM Date: 07/26/20  Intake/Output from previous day: 06/22 0701 - 06/23 0700 In: 1358.6 [P.O.:480; I.V.:878.6] Out: 1870 [Urine:1870] Intake/Output this shift: Total I/O In: -  Out: 700 [Urine:700]  General appearance: alert, cooperative, and no distress Resp: breathing comfortably GI: soft, mildly distended, minimally tender.  Incisions c/d/I. Extremities: extremities normal, atraumatic, no cyanosis or edema  Lab Results:  Recent Labs    07/28/20 0233 07/29/20 0046  WBC 12.4* 10.1  HGB 14.1 14.9  HCT 41.5 45.5  PLT 202 207   BMET Recent Labs    07/28/20 0233 07/28/20 0856  NA 134* 134*  K 4.1 3.8  CL 99 98  CO2 26 29  GLUCOSE 121* 106*  BUN 9 8  CREATININE 1.16 1.14  CALCIUM 8.8* 8.9   PT/INR No results for input(s): LABPROT, INR in the last 72 hours. ABG No results for input(s): PHART, HCO3 in the last 72 hours.  Invalid input(s): PCO2, PO2  Studies/Results: DG UGI W SINGLE CM (SOL OR THIN BA)  Result Date: 07/29/2020 CLINICAL DATA:  Post partial gastrectomy with nausea vomiting. EXAM: UPPER GI SERIES WITH KUB TECHNIQUE: After obtaining a scout radiograph a routine upper GI series was performed using water-soluble contrast followed by thin barium. FLUOROSCOPY TIME:  Fluoroscopy Time:  3 minutes 42 seconds Radiation Exposure Index (if provided by the fluoroscopic device): 55.3 mGy Number of Acquired Spot Images: 4 COMPARISON:  Prior chest x-rays and CT of the chest. FINDINGS: Scout image shows distension of the stomach in the upper abdomen. Signs of lumbar spinal fusion. Bowel gas pattern  otherwise unremarkable with gas in the colon. Swallows with water soluble contrast were performed. The esophagus shows normal distensibility. Signs of gastroesophageal reflux was exhibited during the examination. Distal stomach was patulous. Contrast flowed readily from the stomach into the proximal small bowel with normal proximal small bowel anatomy. IMPRESSION: No signs of leak or other complication. Patulous appearance of the stomach distally in the antrum may reflect changes related to anastomosis and partial resection proximally. Gastroesophageal reflux to the level of the proximal esophagus. Electronically Signed   By: Zetta Bills M.D.   On: 07/29/2020 12:12    Anti-infectives: Anti-infectives (From admission, onward)    Start     Dose/Rate Route Frequency Ordered Stop   07/27/20 1600  ceFAZolin (ANCEF) IVPB 2g/100 mL premix        2 g 200 mL/hr over 30 Minutes Intravenous Every 8 hours 07/27/20 1250 07/27/20 1730   07/27/20 0600  ceFAZolin (ANCEF) IVPB 2g/100 mL premix        2 g 200 mL/hr over 30 Minutes Intravenous On call to O.R. 07/27/20 0547 07/27/20 0805   07/27/20 0550  ceFAZolin (ANCEF) 2-4 GM/100ML-% IVPB       Note to Pharmacy: Wendall Mola   : cabinet override      07/27/20 0550 07/27/20 0821       Assessment/Plan: s/p Procedure(s): ROBOTIC PARTIAL GASTRECTOMY (N/A) ESOPHAGOGASTRODUODENOSCOPY (EGD) (N/A) UGI negative for gastric outlet obstruction.   Advance to soft solids Cleared PT.  Anticipate home tomorrow unless issues arise.  LOS: 2 days   Milus Height, MD FACS Surgical Oncology, General Surgery, Trauma and [facility], [place] for weekday/non holidays Check [email or web address] for coverage night/weekend/holidays  Do not use SecureChat as it is not reliable for timely patient care.

## 2020-07-29 NOTE — Progress Notes (Signed)
   07/29/20 1300  Assess: MEWS Score  Temp 98 F (36.7 C)  BP (!) 138/98  Pulse Rate (!) 146  Resp 17  SpO2 93 %  O2 Device Nasal Cannula  O2 Flow Rate (L/min) 2.5 L/min  Assess: if the MEWS score is Yellow or Red  Were vital signs taken at a resting state? Yes  Focused Assessment No change from prior assessment  Early Detection of Sepsis Score *See Row Information* Low  Treat  MEWS Interventions Administered prn meds/treatments  Take Vital Signs  Increase Vital Sign Frequency  Yellow: Q 2hr X 2 then Q 4hr X 2, if remains yellow, continue Q 4hrs  Escalate  MEWS: Escalate Yellow: discuss with charge nurse/RN and consider discussing with provider and RRT  Notify: Charge Nurse/RN  Name of Charge Nurse/RN Notified Arthur  Date Charge Nurse/RN Notified 07/29/20  Time Charge Nurse/RN Notified 1304  Notify: Provider  Provider Name/Title Dr. Barry Dienes  Date Provider Notified 07/29/20  Time Provider Notified 1303  Notification Type Call  Notification Reason Change in status  Provider response See new orders  Date of Provider Response 07/29/20  Time of Provider Response 1303  Document  Patient Outcome Stabilized after interventions  Progress note created (see row info) Yes

## 2020-07-29 NOTE — Discharge Instructions (Signed)
[place] Office Phone Number [phone]   POST OP INSTRUCTIONS  Always review your discharge instruction sheet given to you by the facility where your surgery was performed.  IF YOU HAVE DISABILITY OR FAMILY LEAVE FORMS, YOU MUST BRING THEM TO THE OFFICE FOR PROCESSING.  DO NOT GIVE THEM TO YOUR DOCTOR.  A prescription for pain medication may be given to you upon discharge.  Take your pain medication as prescribed, if needed.  If narcotic pain medicine is not needed, then you may take acetaminophen (Tylenol) or ibuprofen (Advil) as needed. Take your usually prescribed medications unless otherwise directed If you need a refill on your pain medication, please contact your pharmacy.  They will contact our office to request authorization.  Prescriptions will not be filled after 5pm or on week-ends.  Stay on full liquid/soft solid diet for around 1 week. 5.   It is common to experience some constipation if taking pain medication after surgery.  Increasing fluid intake and taking a stool softener will usually help or prevent this problem from occurring.  A mild laxative (Milk of Magnesia or Miralax) should be taken according to package directions if there are no bowel movements after 48 hours. You may shower in 48 hours.  The surgical glue will flake off in 2-3 weeks.   ACTIVITIES:  No strenuous activity or heavy lifting for 1 week.   You may drive when you no longer are taking prescription pain medication, you can comfortably wear a seatbelt, and you can safely maneuver your car and apply brakes. RETURN TO WORK:  __________5-0 weeks if applicable.  _______________ Dennis Bast should see your doctor in the office for a follow-up appointment approximately three-four weeks after your surgery.    WHEN TO CALL YOUR DOCTOR: Fever over 101.0 Nausea and/or vomiting. Extreme swelling or bruising. Continued bleeding from incision. Increased pain, redness, or drainage from the incision.  The  clinic staff is available to answer your questions during regular business hours.  Please don't hesitate to call and ask to speak to one of the nurses for clinical concerns.  If you have a medical emergency, go to the nearest emergency room or call 911.  A surgeon from [facility] is always on call at the hospital.  For further questions, please visit [email or web address]

## 2020-07-29 NOTE — Discharge Summary (Signed)
Physician Discharge Summary  Patient ID: Elijah Marshall MRN: [id number] DOB/AGE: 69-May-1952 69 y.o.  Admit date: 07/27/2020 Discharge date: 07/30/2020  Admission Diagnoses:  Discharge Diagnoses:  Active Problems:   Gastric dysplasia Patient Active Problem List   Diagnosis Date Noted   Gastric dysplasia 07/27/2020   Paroxysmal tachycardia, unspecified ([place]) 03/17/2020   Atypical atrial flutter ([place]) 03/10/2020   Coronary arteriosclerosis in native artery 01/07/2018   Primary insomnia 01/07/2018   Rectal bleeding 01/07/2018   Chronic arthralgias of knees and hips 01/07/2018   Chronic pain syndrome 01/07/2018   Cough with hemoptysis 09/08/2017   Unexplained night sweats 04/30/2016   Chronic respiratory failure with hypoxia ([place]) 11/19/2015   Arthritis 11/11/2015   Depression 11/11/2015   Status post lobectomy of lung 07/05/2015   Essential hypertension 07/05/2015   Non-small cell carcinoma of lung, right ([place]) 06/24/2015   Hyperlipidemia 04/20/2015   Chronic infection of sinus 04/20/2015   GERD (gastroesophageal reflux disease) 06/08/2014   Chronic rhinitis 10/30/2012   COPD GOLD III 09/26/2011     Discharged Condition: stable  Hospital Course:  Pt was admitted to the floor following a robotic partial gastrectomy.  He was placed on telemetry due to tachycardia in the PACU.  He had some pain issues overnight, but these improved once PO norco was added.  He was able to void on POD 1.  He cleared PT.  However, he developed n/v and  an UGI was ordered.  This showed easy emptying of contrast into the duodenum, but also showed reflux to the upper third of the esophagus.  He was advanced to soft diet on POD 2.  He was discharged to home in stable condition on POD 3.  He continued to have intermittent issues with tachycardia which is not unusual as he carries multiple diagnoses of tachycardia.  His metoprolol was restarted.  He is discharged on  soft diet.    Consults:  None  Significant Diagnostic Studies: labs: HCT 45.5,   Treatments: surgery: see above.  Also UGI see abovel    Discharge Exam: Blood pressure 124/87, pulse 77, temperature 98 F (36.7 C), resp. rate 18, height 5\' 9"  (1.753 m), weight 75.8 kg, SpO2 96 %. General appearance: alert, cooperative, and no distress Resp: breathing comfortably GI: soft, approp tender, wounds c/d/I.   Extremities: extremities normal, atraumatic, no cyanosis or edema  Disposition: Discharge disposition: 01-Home or Self Care      Discharge Instructions     Call MD for:  difficulty breathing, headache or visual disturbances   Complete by: As directed    Call MD for:  persistant nausea and vomiting   Complete by: As directed    Call MD for:  redness, tenderness, or signs of infection (pain, swelling, redness, odor or green/yellow discharge around incision site)   Complete by: As directed    Call MD for:  severe uncontrolled pain   Complete by: As directed    Call MD for:  temperature >100.4   Complete by: As directed    Increase activity slowly   Complete by: As directed       Allergies as of 07/30/2020       Reactions   Aspirin Nausea And Vomiting   Morphine And Related Itching        Medication List     STOP taking these medications    HYDROcodone-acetaminophen 7.5-325 MG tablet Commonly known as: [name] Replaced by: HYDROcodone-acetaminophen 5-325 MG tablet   levocetirizine 5 MG tablet Commonly known  asHarlow Ohms   nystatin 100000 UNIT/ML suspension Commonly known as: MYCOSTATIN       TAKE these medications    albuterol 108 (90 Base) MCG/ACT inhaler Commonly known as: VENTOLIN HFA Inhale 2 puffs into the lungs every 6 (six) hours as needed for wheezing or shortness of breath.   baclofen 10 MG tablet Commonly known as: LIORESAL Take 10 mg by mouth daily as needed for muscle spasms.   Breztri Aerosphere 160-9-4.8 MCG/ACT Aero Generic drug:  Budeson-Glycopyrrol-Formoterol Inhale 2 puffs into the lungs in the morning and at bedtime.   hydrochlorothiazide 25 MG tablet Commonly known as: HYDRODIURIL Take 25 mg by mouth daily.   HYDROcodone-acetaminophen 5-325 MG tablet Commonly known as: NORCO/VICODIN Take 1-2 tablets by mouth every 4 (four) hours as needed for moderate pain or severe pain. Replaces: HYDROcodone-acetaminophen 7.5-325 MG tablet   HYDROcodone-acetaminophen 5-325 MG tablet Commonly known as: NORCO/VICODIN Take 1-2 tablets by mouth every 4 (four) hours as needed for moderate pain or severe pain.   metoprolol tartrate 25 MG tablet Commonly known as: LOPRESSOR Take 1 tablet (25 mg total) by mouth 2 (two) times daily.   OXYGEN Inhale 2 L into the lungs daily as needed (with exertion only).   polyethylene glycol 17 g packet Commonly known as: MIRALAX / GLYCOLAX Take 17 g by mouth daily.   prochlorperazine 10 MG tablet Commonly known as: COMPAZINE Take 1 tablet (10 mg total) by mouth every 6 (six) hours as needed for nausea or vomiting (Use for nausea and / or vomiting unresolved with ondansetron (Zofran).).       ASK your doctor about these medications    amitriptyline 25 MG tablet Commonly known as: ELAVIL TAKE 1 TABLET (25 MG TOTAL) BY MOUTH AT BEDTIME AS NEEDED FOR SLEEP.   atorvastatin 40 MG tablet Commonly known as: LIPITOR TAKE 1 TABLET BY MOUTH EVERY DAY   Breztri Aerosphere 160-9-4.8 MCG/ACT Aero Generic drug: Budeson-Glycopyrrol-Formoterol INHALE 2 PUFFS INTO THE LUNGS 2 TIMES DAILY   famotidine 40 MG tablet Commonly known as: PEPCID Take 1 tablet (40 mg total) by mouth 2 (two) times daily.   lansoprazole 30 MG capsule Commonly known as: PREVACID TAKE 1 CAPSULE (30 MG TOTAL) BY MOUTH 2 (TWO) TIMES DAILY.        Follow-up Information     Stark Klein, MD Follow up in 2 week(s).   Specialty: General Surgery Contact information: [address] [place]  [address] [phone]                 Signed: Stark Klein 07/30/2020, 1:48 PM

## 2020-07-30 LAB — SURGICAL PATHOLOGY

## 2020-07-30 MED ORDER — METOPROLOL TARTRATE 25 MG PO TABS: 25.0000 mg | ORAL_TABLET | Freq: Two times a day (BID) | ORAL | 0 refills | Status: DC

## 2020-07-30 MED ORDER — PROCHLORPERAZINE MALEATE 10 MG PO TABS
10.0000 mg | ORAL_TABLET | Freq: Four times a day (QID) | ORAL | 0 refills | Status: DC | PRN
Start: 2020-07-30 — End: 2020-08-11

## 2020-07-30 NOTE — Progress Notes (Signed)
Patient discharged to home with all belongings via wheelchair

## 2020-07-30 NOTE — Op Note (Signed)
[facility] Patient Name: Elijah Marshall Procedure Date : 07/27/2020 MRN: [id number] Attending MD: Carol Ada , MD Date of Birth: [birth year] CSN: [id number] Age: 70 Admit Type: Inpatient Procedure:                Upper GI endoscopy Indications:              Diagnostic procedure Providers:                Carol Ada, MD Referring MD:              Medicines:                General Anesthesia Complications:            No immediate complications. Estimated Blood Loss:     Estimated blood loss: none. Procedure:                Pre-Anesthesia Assessment:                           - Prior to the procedure, a History and Physical                            was performed, and patient medications and                            allergies were reviewed. The patient's tolerance of                            previous anesthesia was also reviewed. The risks                            and benefits of the procedure and the sedation                            options and risks were discussed with the patient.                            All questions were answered, and informed consent                            was obtained. Prior Anticoagulants: The patient has                            taken no previous anticoagulant or antiplatelet                            agents. ASA Grade Assessment: III - A patient with                            severe systemic disease. After reviewing the risks                            and benefits, the patient was deemed in  satisfactory condition to undergo the procedure.                           - Sedation was administered by an anesthesia                            professional. General anesthesia was attained.                           After obtaining informed consent, the endoscope was                            passed under direct vision. Throughout the                            procedure, the patient's blood pressure,  pulse, and                            oxygen saturations were monitored continuously. The                            GIF-H190 ([id number]) Olympus gastroscope was                            introduced through the mouth, and advanced to the                            second part of duodenum. The upper GI endoscopy was                            accomplished without difficulty. The patient                            tolerated the procedure well. Scope In: Scope Out: Findings:      The esophagus was normal.      Evidence of a previous surgical anastomosis was found in the gastric       body. This was characterized by inflammation and an intact appearance.      The examined duodenum was normal.      The EGD was performed intraoperatively at the request of Dr. Barry Dienes to       ensure that the ulcerated lesion was resected. The resected specimen by       Dr. Barry Dienes only showed HGD on frozen path and there was no evidence of       any malignancy. The EGD confirmed that the prior tattooed area was       resected. There was evidence of surgical cautery, but no evidence of any       other mucosal abnormalities. Impression:               - Normal esophagus.                           - A previous surgical anastomosis was found,  characterized by inflammation and an intact                            appearance.                           - Normal examined duodenum.                           - No specimens collected. Recommendation:           - Operative management per Dr. Barry Dienes. Procedure Code(s):        --- Professional ---                           [phone], Esophagogastroduodenoscopy, flexible,                            transoral; diagnostic, including collection of                            specimen(s) by brushing or washing, when performed                            (separate procedure) Diagnosis Code(s):        --- Professional ---                           Z98.0,  Intestinal bypass and anastomosis status CPT copyright 2019 [employer]. All rights reserved. The codes documented in this report are preliminary and upon coder review may  be revised to meet current compliance requirements. Carol Ada, MD Carol Ada, MD 07/30/2020 8:09:39 AM This report has been signed electronically. Number of Addenda: 0

## 2020-07-30 NOTE — Care Management Important Message (Signed)
Important Message  Patient Details  Name: Elijah Marshall MRN: [id number] Date of Birth: Jul 05, 1950   Medicare Important Message Given:  Yes     Shariyah Eland 07/30/2020, 1:17 PM

## 2020-07-30 NOTE — Progress Notes (Signed)
Discharge instructions given to patient. Patient verbalized understanding of the teaching. Patient ready for discharge just waiting for wife to come pick him up

## 2020-08-04 ENCOUNTER — Other Ambulatory Visit: Payer: Self-pay

## 2020-08-04 NOTE — Progress Notes (Signed)
The proposed treatment discussed in conference is for discussion purpose only and is not a binding recommendation.  The patients have not been physically examined, or presented with their treatment options.  Therefore, final treatment plans cannot be decided.  

## 2020-08-11 ENCOUNTER — Encounter: Payer: Self-pay | Admitting: Cardiology

## 2020-08-11 ENCOUNTER — Ambulatory Visit: Payer: Medicare Other | Admitting: Cardiology

## 2020-08-11 ENCOUNTER — Other Ambulatory Visit: Payer: Self-pay

## 2020-08-11 VITALS — BP 151/86 | HR 76 | Temp 97.6°F | Ht 69.0 in | Wt 163.8 lb

## 2020-08-11 DIAGNOSIS — I484 Atypical atrial flutter: Secondary | ICD-10-CM

## 2020-08-11 DIAGNOSIS — I10 Essential (primary) hypertension: Secondary | ICD-10-CM

## 2020-08-11 NOTE — Progress Notes (Signed)
Patient referred by Sherren Mocha, MD for tachycardia  Subjective:   Elijah Marshall, male    DOB: 06/18/1950, 70 y.o.   MRN: [id number]   Chief Complaint  Patient presents with   Paroxysmal tachycardia   Follow-up    HPI   70 y/o [place] American male with hypertension, paroxysmal atrial flutter, COPD GOLD III, former smoker, h/o non-small cell carcinoma of right lung s/p lobectomy, h/o recent gastric surgery  He has not had any recent palpitations symptoms. He underwent gastric surgery, details not available to me. Blood pressure usually better controlled at home.    Current Outpatient Medications on File Prior to Visit  Medication Sig Dispense Refill   albuterol (VENTOLIN HFA) 108 (90 Base) MCG/ACT inhaler Inhale 2 puffs into the lungs every 6 (six) hours as needed for wheezing or shortness of breath.     amitriptyline (ELAVIL) 25 MG tablet TAKE 1 TABLET (25 MG TOTAL) BY MOUTH AT BEDTIME AS NEEDED FOR SLEEP. (Patient not taking: Reported on 06/18/2020) 90 tablet 0   atorvastatin (LIPITOR) 40 MG tablet TAKE 1 TABLET BY MOUTH EVERY DAY (Patient taking differently: Take 40 mg by mouth daily.) 90 tablet 0   baclofen (LIORESAL) 10 MG tablet Take 10 mg by mouth daily as needed for muscle spasms.     BREZTRI AEROSPHERE 160-9-4.8 MCG/ACT AERO INHALE 2 PUFFS INTO THE LUNGS 2 TIMES DAILY (Patient taking differently: Inhale 2 puffs into the lungs in the morning and at bedtime.) 10.7 g 5   Budeson-Glycopyrrol-Formoterol (BREZTRI AEROSPHERE) 160-9-4.8 MCG/ACT AERO Inhale 2 puffs into the lungs in the morning and at bedtime. 5.9 g 0   famotidine (PEPCID) 40 MG tablet Take 1 tablet (40 mg total) by mouth 2 (two) times daily. (Patient taking differently: Take 40 mg by mouth daily as needed for heartburn.) 180 tablet 3   hydrochlorothiazide (HYDRODIURIL) 25 MG tablet Take 25 mg by mouth daily.     HYDROcodone-acetaminophen (NORCO/VICODIN) 5-325 MG tablet Take 1-2 tablets by mouth every 4 (four)  hours as needed for moderate pain or severe pain. 40 tablet 0   HYDROcodone-acetaminophen (NORCO/VICODIN) 5-325 MG tablet Take 1-2 tablets by mouth every 4 (four) hours as needed for moderate pain or severe pain. 20 tablet 0   lansoprazole (PREVACID) 30 MG capsule TAKE 1 CAPSULE (30 MG TOTAL) BY MOUTH 2 (TWO) TIMES DAILY. (Patient taking differently: Take 30 mg by mouth 2 (two) times daily before a meal.) 180 capsule 1   metoprolol tartrate (LOPRESSOR) 25 MG tablet Take 1 tablet (25 mg total) by mouth 2 (two) times daily. 60 tablet 0   OXYGEN Inhale 2 L into the lungs daily as needed (with exertion only).     polyethylene glycol (MIRALAX / GLYCOLAX) 17 g packet Take 17 g by mouth daily.     prochlorperazine (COMPAZINE) 10 MG tablet Take 1 tablet (10 mg total) by mouth every 6 (six) hours as needed for nausea or vomiting (Use for nausea and / or vomiting unresolved with ondansetron (Zofran).). 30 tablet 0   No current facility-administered medications on file prior to visit.    Cardiovascular and other pertinent studies:  Echocardiogram 03/25/2020:  Left ventricle cavity is normal in size and wall thickness. Normal global  wall motion. Normal LV systolic function with EF 65%. Normal diastolic  filling pattern.  No significant valvular abnormality.  normal right atrial pressure.   Mobile cardiac telemetry 6 days 03/17/2020 - 03/24/2020: Dominant rhythm: Sinus. HR 54-110 bpm. Avg HR  77 bpm. <1% isolated SVE, couplet <1% isolated VE, no couplet/triplets. No atrial fibrillation/atrial flutter/SVT/VT/high grade AV block, sinus pause >3sec noted. 1 patient triggered event correlates with artifact  EKG 03/31/2020: Sinus rhythm 81 bpm  RSR(V1) -nondiagnostic  Echocardiogram 03/25/2020:  Left ventricle cavity is normal in size and wall thickness. Normal global  wall motion. Normal LV systolic function with EF 65%. Normal diastolic  filling pattern.  No significant valvular abnormality.  normal  right atrial pressure.   Recent labs: 07/29/2020: Glucose 106, BUN/Cr 8/1.14. EGFR >60. Na/K 134/4.4.  H/H 14/45. MCV 89. Platelets 207  07/30/2019: Glucose 94, BUN/Cr 8/1.11. EGFR 78. Na/K 138/4.3. Rest of the CMP normal H/H 17.1/61.9. MCV 90.4. Platelets 268 HbA1C 6.1% Chol 181, TG 112, HDL 55, LDL 104 TSH N/A   Review of Systems  Cardiovascular:  Negative for chest pain, dyspnea on exertion, leg swelling, palpitations and syncope.        Vitals:   08/11/20 1328 08/11/20 1335  BP: (!) 147/89 (!) 151/86  Pulse: 81 76  Temp: 97.6 F (36.4 C)   SpO2: 97% 96%    Body mass index is 24.19 kg/m. Filed Weights   08/11/20 1328  Weight: 163 lb 12.8 oz (74.3 kg)     Objective:   Physical Exam Vitals and nursing note reviewed.  Constitutional:      General: He is not in acute distress. Neck:     Vascular: No JVD.  Cardiovascular:     Rate and Rhythm: Normal rate and regular rhythm.     Heart sounds: Normal heart sounds. No murmur heard. Pulmonary:     Effort: Pulmonary effort is normal.     Breath sounds: Normal breath sounds. No wheezing or rales.  Abdominal:     Comments: Healed periumbilical scar  Musculoskeletal:     Right lower leg: No edema.     Left lower leg: No edema.         Assessment & Recommendations:   70 y/o [place] American male with hypertension, paroxysmal atrial flutter, COPD GOLD III, former smoker, h/o non-small cell carcinoma of right lung s/p lobectomy, h/o recent gastric surgery  Tachycardia: Likely paroxysmal atrial flutter, today in sinus rhythm.  No recent recurrence CHA2DS2VASc score 2, annual stroke risk 2.2% Should he have recurrence of atrial flutter, he will need eliquis again Continue metoprolol tartrate 25 mg bid.   Hypertension: Suspect white coat hypertension. No changes made today.  F/u in 6 months   Elijah Mormon, MD Pager: [phone] Office: [phone]

## 2020-08-24 ENCOUNTER — Ambulatory Visit: Payer: Medicare Other | Admitting: Internal Medicine

## 2020-09-08 ENCOUNTER — Telehealth: Payer: Self-pay | Admitting: Internal Medicine

## 2020-09-08 MED ORDER — BREZTRI AEROSPHERE 160-9-4.8 MCG/ACT IN AERO: 2.0000 | INHALATION_SPRAY | Freq: Two times a day (BID) | RESPIRATORY_TRACT | 0 refills | Status: DC

## 2020-09-08 NOTE — Telephone Encounter (Signed)
Called and spoke with Patient.  Patient stated he has been waiting for the past few days for his pharmacy to receive Breztri.  Patient requested Breztri sample until his pharmacy shipment arrives. Breztri samples placed at front desk for Patient pick up.  Nothing further at this time.

## 2020-09-27 ENCOUNTER — Encounter: Payer: Self-pay | Admitting: *Deleted

## 2020-09-27 DIAGNOSIS — Q403 Congenital malformation of stomach, unspecified: Secondary | ICD-10-CM

## 2020-09-28 ENCOUNTER — Ambulatory Visit: Payer: Medicare Other | Admitting: Internal Medicine

## 2020-09-28 ENCOUNTER — Encounter: Payer: Self-pay | Admitting: Internal Medicine

## 2020-09-28 ENCOUNTER — Telehealth: Payer: Self-pay | Admitting: Internal Medicine

## 2020-09-28 ENCOUNTER — Other Ambulatory Visit: Payer: Self-pay

## 2020-09-28 DIAGNOSIS — J449 Chronic obstructive pulmonary disease, unspecified: Secondary | ICD-10-CM | POA: Diagnosis not present

## 2020-09-28 DIAGNOSIS — J9611 Chronic respiratory failure with hypoxia: Secondary | ICD-10-CM

## 2020-09-28 NOTE — Patient Instructions (Addendum)
We will walk you today to see what your oxygen needs are   Make sure you check your oxygen saturation  at your highest level of activity  to be sure it stays over 90% and adjust  02 flow upward to maintain this level if needed but remember to turn it back to previous settings when you stop (to conserve your supply).   No change in your medications  Please schedule a follow up visit in 12 months but call sooner if needed

## 2020-09-28 NOTE — Progress Notes (Addendum)
Subjective:   Patient ID: Elijah Marshall, male    DOB: 1951/01/10  MRN: [id number]   Brief patient profile:  70  yobm quit smoking 2013  and able work out at [employer] and did fine until hurt back 2011 then 2 back surgeries and knee surgery  and more noticeable doe  since then but also limited by back and s/p sup segmentectomy RLL NSC lung ca Ia 05/2015  referred to pulmonary clinic 09/05/2013 for ? Copd  With GOLD II criteria 10/29/13 and confirmed 01/10/2016     History of Present Illness  09/05/2013 1st [facility] office visit/ Tyjanae Bartek  Chief Complaint  Patient presents with   Pulmonary Consult    Referred per Dr. Norberto Sorenson.  Pt c/o SOB for the past 3 yrs. He states that he was dxed with COPD back in 2012 or 2013.  He states that that he sometimes has trouble with breathing when walking up stairs and lifting things.    rx spiriva first, alb, dulera not as effective  No problem at rest or supine Assoc nasal congestion much better p shot (?depomedrol) and bad again x sev years corresponds to worse doe  Ex = walking one mile slower pace than wife,trouble with hills, worse in heat  Bad hb even on dexilant  Min dry cough  rec Stop spiriva  Start anoro two puffs off one click each am Zantac 150 mg one at  Bedtime Prednisone 10 mg take  4 each am x 2 days,   2 each am x 2 days,  1 each am x 2 days and stop  GERD diet    04/19/2017  f/u ov/Kimberli Winne re:  GOLD II copd /  Chief Complaint  Patient presents with   Follow-up    Patient was told to follow up with formulary in hand to discuss medications.   Dyspnea:   improved with walking back up  to 30 min on 2lpm  Cough: none Sleep: no resp problems SABA use:  No change on bevespi vs symbicort / concerned with cost issues rec Plan A = Automatic = Bevespi 2 every 12 hours or ANORO one click each am  But pick the one that works the best for you and if they are both the same then chose the less costly  Plan B = Backup Only use your albuterol as  a rescue medication   Could not tol anoro due to tremors > resolved on Incruse           05/20/2019  f/u ov/Malvika Tung re: GOLD III/ 02 prn on bevespi Chief Complaint  Patient presents with   Follow-up    Breathing has been worse off and on over the past month. He has noticed some wheezing. He is using his rescue inhaler 3 x per wk.   Dyspnea:  MMRC3 = can't walk 100 yards even at a slow pace at a flat grade s stopping due to sob  = can't finish [employer] s stopping / assoc wheeze Cough: none Sleeping: ok flat/ 2 pillows  SABA use: as above  02: just prn  rec Plan A = Automatic = Always=    Change to breztri Take 2 puffs first thing in am and then another 2 puffs about 12 hours later.  Plan B = Backup (to supplement plan A, not to replace it) Only use your albuterol inhaler as a rescue medication Make sure you check your oxygen saturations at highest level of activity to be sure  it stays over 90% and adjust upward to maintain this level if needed      09/28/2020  f/u ov/Landynn Dupler re: gold 3 copd maint on breztri and prn 02   Chief Complaint  Patient presents with   Follow-up    Patient has no concerns today.   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   Sleeping  without nocturnal  or early am exacerbation  of respiratory  c/o's or need for noct saba. Also denies any obvious fluctuation of symptoms with weather or environmental changes or other aggravating or alleviating factors except as outlined above   No unusual exposure hx or h/o childhood pna/ asthma or knowledge of premature birth.  Current Allergies, Complete Past Medical History, Past Surgical History, Family  History, and Social History were reviewed in [employer] record.  ROS  The following are not active complaints unless bolded Hoarseness, sore throat, dysphagia, dental problems, itching, sneezing,  nasal congestion or discharge of excess mucus or purulent secretions, ear ache,   fever, chills, sweats, unintended wt loss or wt gain, classically pleuritic or exertional cp,  orthopnea pnd or arm/hand swelling  or leg swelling, presyncope, palpitations, abdominal pain, anorexia, nausea, vomiting, diarrhea  or change in bowel habits or change in bladder habits, change in stools or change in urine, dysuria, hematuria,  rash, arthralgias, visual complaints, headache, numbness, weakness or ataxia or problems with walking or coordination,  change in mood or  memory.        Current Meds  Medication Sig   albuterol (VENTOLIN HFA) 108 (90 Base) MCG/ACT inhaler Inhale 2 puffs into the lungs every 6 (six) hours as needed for wheezing or shortness of breath.   amitriptyline (ELAVIL) 25 MG tablet TAKE 1 TABLET (25 MG TOTAL) BY MOUTH AT BEDTIME AS NEEDED FOR SLEEP.   atorvastatin (LIPITOR) 40 MG tablet TAKE 1 TABLET BY MOUTH EVERY DAY (Patient taking differently: Take 40 mg by mouth daily.)   baclofen (LIORESAL) 10 MG tablet Take 10 mg by mouth daily as needed for muscle spasms.   BREZTRI AEROSPHERE 160-9-4.8 MCG/ACT AERO INHALE 2 PUFFS INTO THE LUNGS 2 TIMES DAILY (Patient taking differently: Inhale 2 puffs into the lungs in the morning and at bedtime.)   famotidine (PEPCID) 40 MG tablet Take 1 tablet (40 mg total) by mouth 2 (two) times daily. (Patient taking differently: Take 40 mg by mouth daily as needed for heartburn.)   hydrochlorothiazide (HYDRODIURIL) 25 MG tablet Take 25 mg by mouth daily.   HYDROcodone-acetaminophen (NORCO/VICODIN) 5-325 MG tablet Take 1-2 tablets by mouth every 4 (four) hours as needed for moderate pain or severe pain.   lansoprazole (PREVACID) 30 MG capsule TAKE 1  CAPSULE (30 MG TOTAL) BY MOUTH 2 (TWO) TIMES DAILY. (Patient taking differently: Take 30 mg by mouth 2 (two) times daily before a meal.)   levocetirizine (XYZAL) 5 MG tablet Take 5 mg by mouth every evening.   metoprolol tartrate (LOPRESSOR) 25 MG tablet Take 1 tablet (25 mg total) by mouth 2 (two) times daily.   OXYGEN Inhale 2 L into the lungs daily  as needed (with exertion only).   polyethylene glycol (MIRALAX / GLYCOLAX) 17 g packet Take 17 g by mouth daily.   sildenafil (VIAGRA) 100 MG tablet Take 100 mg by mouth daily as needed for erectile dysfunction.   [DISCONTINUED] bisoprolol-hydrochlorothiazide (ZIAC) 5-6.25 MG tablet Take 1 tablet by mouth daily.              Objective:   Physical Exam  09/28/2020        168  07/28/2019        179   05/20/2019       178   12/17/2018      182  04/01/2018        182  12/28/2017      176  08/27/2017        176  06/22/2017        177  05/22/2017        183  04/19/2017        179      07/31/2016        182  05/16/2016        181  04/28/2016        185  01/10/2016        179    11/19/15 182 lb (82.6 kg)  11/11/15 180 lb (81.6 kg)  11/04/15 181 lb 3.5 oz (82.2 kg)      Vital signs reviewed  09/28/2020  - Note at rest 02 sats  94% on RA   General appearance:    amb bm nad    HEENT : pt wearing mask not removed for exam due to covid -19 concerns.    NECK :  without JVD/Nodes/TM/ nl carotid upstrokes bilaterally   LUNGS: no acc muscle use,  Mod barrel  contour chest wall with bilateral  Distant bs s audible wheeze and  without cough on insp or exp maneuvers and mod  Hyperresonant  to  percussion bilaterally     CV:  RRR  no s3 or murmur or increase in P2, and no edema   ABD:  soft and nontender with pos mid insp Hoover's  in the supine position. No bruits or organomegaly appreciated, bowel sounds nl  MS:     ext warm without deformities, calf tenderness, cyanosis or clubbing No obvious joint restrictions   SKIN: warm and dry without  lesions    NEURO:  alert, approp, nl sensorium with  no motor or cerebellar deficits apparent.

## 2020-09-28 NOTE — Telephone Encounter (Signed)
Scheduled appt per 8/23 sch msg. Called pt, no answer. Left msg with appt date and time

## 2020-09-29 ENCOUNTER — Encounter: Payer: Self-pay | Admitting: Internal Medicine

## 2020-09-29 NOTE — Assessment & Plan Note (Signed)
Dx 2017 SATURATION QUALIFICATIONS:  11/19/2015  Patient Saturations on Room Air at Rest = 96% Patient Saturations on Room Air while Ambulating = 86% Patient Saturations on 2  Liters of oxygen while Ambulating = 96% - 05/16/2016 :   Saturations on Room Air at Rest = 88%--- increased to 99% on 2lpm continuous with activity   -  07/28/2019   Walked RA  2 laps @ approx 232ft each @ avg pace  stopped due to  sob with sats still 91%  - 09/28/2020   Walked on RA x  2  lap(s) =  approx 500 @ avg pace,  with lowest 02 sats 91% and no sob   Advised: Make sure you check your oxygen saturation  at your highest level of activity  to be sure it stays over 90% and adjust  02 flow upward to maintain this level if needed but remember to turn it back to previous settings when you stop (to conserve your supply).  If sats > 90% with whatever level of exertion you plan, no need for 02 when you complete it.

## 2020-09-29 NOTE — Assessment & Plan Note (Signed)
Quit smoking 2013 - Spirometry  03/01/12  FEV1  1.63 - Spirometry   09/05/13 FEV1  1.34 (46%) ratio 44  - PFTs   10/29/2013    FEV1    1.70 (62%) ratio 50% no better after ssaba and DLCO 48%  - 09/05/2013  Walked RA x 3 laps @ fast pace @  185 ft each stopped due to end of study, fast pace,sat 89% at end  - 01/06/14 gradual worsening on anoro plus increase need for saba  - 01/28/2014   > try spiriva respimat/ symbicort 160 2bid > changed to symbicort 160 2bid only on 01/28/14  - 02/25/2014   90%   > changed to symbicort 80 2bid since hoarse on 160 and clear on exam  - 11/19/2015 rechallenge with symbicort 160 Take 2 puffs first thing in am and then another 2 puffs about 12 hours later.  - PFT's  01/10/2016  FEV1 1.38 (51 % ) ratio 47  p 1 % improvement from saba p symb 160  prior to study with DLCO  43/41  % corrects to 47  % for alv volume   - 02/21/2017  After extensive coaching inhaler device  effectiveness =    90% vs baseline < 25%  - 02/21/2017 changed to bevespi 2 bid  - 04/19/2017  Demonstrated elipta > try anoro to see if cost less/ works as well  - PFT's  05/22/2017  FEV1 1.21  (46 % ) ratio 46  p 1 % improvement from saba p anoro and albuterol 1 h prior to study with DLCO  38 % corrects to 49 % for alv volume   - changed to Incruse 06/04/17 due to tremors > resolved  - 04/01/2018    try change to bevespi 1-2 bid (one bid if too tremulous from 2)  - 10/21/2018 changed to anoro > globus sensation reported 12/17/2018  -12/17/2018   bevespi to see if throat symptoms improve  - 05/20/2019  After extensive coaching inhaler device,  effectiveness =  90% > changed to breztri   Group D in terms of symptom/risk and laba/lama/ICS  therefore appropriate rx at this point >>>  breztri and prn saba which he continues to misunderstand along with 02 both of which he uses post ex  I spent extra time with pt today reviewing appropriate use of albuterol for prn use on exertion with the following points: 1) saba is  for relief of sob that does not improve by walking a slower pace or resting but rather if the pt does not improve after trying this first. 2) If the pt is convinced, as many are, that saba helps recover from activity faster then it's easy to tell if this is the case by re-challenging : ie stop, take the inhaler, then p 5 minutes try the exact same activity (intensity of workload) that just caused the symptoms and see if they are substantially diminished or not after saba 3) if there is an activity that reproducibly causes the symptoms, try the saba 15 min before the activity on alternate days   If in fact the saba really does help, then fine to continue to use it prn but advised may need to look closer at the maintenance regimen being used to achieve better control of airways disease with exertion.    Each maintenance medication was reviewed in detail including emphasizing most importantly the difference between maintenance and prns and under what circumstances the prns are to be triggered using an action  plan format where appropriate.  Total time for H and P, chart review, counseling, reviewing haf device(s) , directly observing portions of ambulatory 02 saturation study/ and generating customized AVS unique to this office visit / same day charting = 31 min

## 2020-10-01 ENCOUNTER — Telehealth: Payer: Self-pay | Admitting: Internal Medicine

## 2020-10-01 MED ORDER — BREZTRI AEROSPHERE 160-9-4.8 MCG/ACT IN AERO: 2.0000 | INHALATION_SPRAY | Freq: Two times a day (BID) | RESPIRATORY_TRACT | 0 refills | Status: DC

## 2020-10-01 NOTE — Telephone Encounter (Signed)
Call made to patient, confirmed DOB. Requesting samples of breztri stating he is in the doughnut hole and it will cost him $162. Sample and patient assistance placed upfront for pick up. Patient aware to fill out application and bring it back.   Nothing further needed at this time.

## 2020-10-05 ENCOUNTER — Inpatient Hospital Stay: Payer: Medicare Other | Attending: Internal Medicine | Admitting: Dietician

## 2020-10-05 ENCOUNTER — Other Ambulatory Visit: Payer: Self-pay

## 2020-10-05 NOTE — Progress Notes (Signed)
Nutrition Assessment   Reason for Assessment: Provider request   ASSESSMENT: 70 year old male gastric metaplasia with high grade dysplasia s/p robotic partial gastrectomy 6/21 with Dr. Barry Dienes.  Past medical history of non-small cell carcinoma of lung s/p right lobectomy, chronic respiratory failure with hypoxia, GERD lung cancer, HTN, HLD, atypical atrial flutter, COPD, chronic pain syndrome.  Met with patient in clinic. He reports his appetite is improving, patient says is unable to eat as much as he use to prior to surgery. Patient eats 3 meals daily. Breakfast is usually a banana, oatmeal or cereal, ate chicken legs and fries for lunch, hamburger for dinner. Patient drinks juices and water. Patient reports 2 bowel movements daily, he takes Mirlax. Patient reports that his sister passed shortly before his surgery and has not had an opportunity to grieve. Patient feels his decreased appetite related to grief. He is interested in support groups.  Nutrition Focused Physical Exam: deferred   Medications: Pepcid, norco/vicodin, miralax, pepcid, prevacid, lipitor, elavil   Labs: reviewed   Anthropometrics: Weight has decreased 8 lbs (4.5%) from usual body weight in 6 months. This is insignificant.   Height: 5'9" Weight: 168 lb 3.2 oz UBW: 176 lb (2/9) BMI: 24.84  NUTRITION DIAGNOSIS: Inadequate oral intake related to gastric metaplasia s/p partial gastrectomy as evidenced by reported decreased appetite and 4.5% (8 lb) decrease in weight in 6 months.    INTERVENTION:  Educated on strategies for poor appetite, encouraged small frequent meals and snacks  Discussed the importance of calories and protein to support healing Educated on foods with protein, encouraged protein source with meals and snacks - handout and snack ideas provided Suggested drinking 1-2 Ensure Plus/equivalent daily for added calories and protein, provided samples for patient to try as well as coupons Educated on  support services available at [facility], provided flyers and monthly calendar of events  Patient declined referral to chaplain at this time, contact information provided RD contact information given    MONITORING, EVALUATION, GOAL: Patient will tolerate adequate calories and protein to promote weight stability   Next Visit: Friday October 7 via telephone (pt aware)

## 2020-11-03 ENCOUNTER — Telehealth: Payer: Self-pay | Admitting: Internal Medicine

## 2020-11-03 NOTE — Telephone Encounter (Signed)
He should try mucinex d otc  (not dm) and saline nasal spray to see if he gets relief and if not call back by 9/30 afternoon

## 2020-11-03 NOTE — Telephone Encounter (Signed)
Called and spoke with patient. He stated that he has noticed an increase in his nasal and chest congestion over the past 3 days. His nasal discharge has been clear but he has been coughing up yellow phlegm. He denied any fevers, body aches or being around anyone who has been sick recently. He thought it was his allergies so he started back to taking Xyzal but has not noticed any relief.   He feels like he may have a sinus infection.   Pharmacy is [facility] on [address], can you please advise? Thanks!

## 2020-11-03 NOTE — Telephone Encounter (Signed)
Called and spoke with patient to let him know the recs from Dr. Melvyn Novas. Patient expressed understanding. Nothing further needed at this time.

## 2020-11-05 ENCOUNTER — Encounter (HOSPITAL_BASED_OUTPATIENT_CLINIC_OR_DEPARTMENT_OTHER): Payer: Self-pay | Admitting: *Deleted

## 2020-11-05 ENCOUNTER — Emergency Department (HOSPITAL_BASED_OUTPATIENT_CLINIC_OR_DEPARTMENT_OTHER): Payer: Medicare Other

## 2020-11-05 ENCOUNTER — Other Ambulatory Visit: Payer: Self-pay

## 2020-11-05 ENCOUNTER — Inpatient Hospital Stay (HOSPITAL_BASED_OUTPATIENT_CLINIC_OR_DEPARTMENT_OTHER)
Admission: EM | Admit: 2020-11-05 | Discharge: 2020-11-07 | DRG: 190 | Disposition: A | Discharge status: Home / Self Care | Payer: Medicare Other | Source: Other Acute Inpatient Hospital | Attending: Family Medicine | Admitting: Family Medicine

## 2020-11-05 ENCOUNTER — Ambulatory Visit
Admission: RE | Admit: 2020-11-05 | Discharge: 2020-11-05 | Disposition: A | Discharge status: Home / Self Care | Payer: Medicare Other | Source: Ambulatory Visit

## 2020-11-05 VITALS — BP 145/90 | HR 100 | Temp 98.8°F | Resp 23

## 2020-11-05 DIAGNOSIS — J441 Chronic obstructive pulmonary disease with (acute) exacerbation: Secondary | ICD-10-CM | POA: Diagnosis present

## 2020-11-05 DIAGNOSIS — K219 Gastro-esophageal reflux disease without esophagitis: Secondary | ICD-10-CM | POA: Diagnosis present

## 2020-11-05 DIAGNOSIS — E876 Hypokalemia: Secondary | ICD-10-CM | POA: Diagnosis present

## 2020-11-05 DIAGNOSIS — Z87891 Personal history of nicotine dependence: Secondary | ICD-10-CM | POA: Diagnosis not present

## 2020-11-05 DIAGNOSIS — Z886 Allergy status to analgesic agent status: Secondary | ICD-10-CM | POA: Diagnosis not present

## 2020-11-05 DIAGNOSIS — Z902 Acquired absence of lung [part of]: Secondary | ICD-10-CM | POA: Diagnosis not present

## 2020-11-05 DIAGNOSIS — Z885 Allergy status to narcotic agent status: Secondary | ICD-10-CM

## 2020-11-05 DIAGNOSIS — Z20822 Contact with and (suspected) exposure to covid-19: Secondary | ICD-10-CM | POA: Diagnosis present

## 2020-11-05 DIAGNOSIS — Z8249 Family history of ischemic heart disease and other diseases of the circulatory system: Secondary | ICD-10-CM

## 2020-11-05 DIAGNOSIS — Z9981 Dependence on supplemental oxygen: Secondary | ICD-10-CM | POA: Diagnosis not present

## 2020-11-05 DIAGNOSIS — J309 Allergic rhinitis, unspecified: Secondary | ICD-10-CM | POA: Diagnosis present

## 2020-11-05 DIAGNOSIS — J9601 Acute respiratory failure with hypoxia: Secondary | ICD-10-CM

## 2020-11-05 DIAGNOSIS — I10 Essential (primary) hypertension: Secondary | ICD-10-CM

## 2020-11-05 DIAGNOSIS — E785 Hyperlipidemia, unspecified: Secondary | ICD-10-CM | POA: Diagnosis present

## 2020-11-05 DIAGNOSIS — Z981 Arthrodesis status: Secondary | ICD-10-CM | POA: Diagnosis not present

## 2020-11-05 DIAGNOSIS — Z833 Family history of diabetes mellitus: Secondary | ICD-10-CM

## 2020-11-05 DIAGNOSIS — Z85118 Personal history of other malignant neoplasm of bronchus and lung: Secondary | ICD-10-CM

## 2020-11-05 DIAGNOSIS — I2510 Atherosclerotic heart disease of native coronary artery without angina pectoris: Secondary | ICD-10-CM | POA: Diagnosis present

## 2020-11-05 DIAGNOSIS — Z79899 Other long term (current) drug therapy: Secondary | ICD-10-CM | POA: Diagnosis not present

## 2020-11-05 DIAGNOSIS — R0602 Shortness of breath: Secondary | ICD-10-CM

## 2020-11-05 DIAGNOSIS — Z800 Family history of malignant neoplasm of digestive organs: Secondary | ICD-10-CM | POA: Diagnosis not present

## 2020-11-05 DIAGNOSIS — J302 Other seasonal allergic rhinitis: Secondary | ICD-10-CM | POA: Diagnosis present

## 2020-11-05 LAB — CBC WITH DIFFERENTIAL/PLATELET
Abs Immature Granulocytes: 0.04 10*3/uL (ref 0.00–0.07)
Basophils Absolute: 0 10*3/uL (ref 0.0–0.1)
Basophils Relative: 0 %
Eosinophils Absolute: 0.1 10*3/uL (ref 0.0–0.5)
Eosinophils Relative: 1 %
HCT: 48.8 % (ref 39.0–52.0)
Hemoglobin: 16.6 g/dL (ref 13.0–17.0)
Immature Granulocytes: 0 %
Lymphocytes Relative: 14 %
Lymphs Abs: 1.8 10*3/uL (ref 0.7–4.0)
MCH: 29.5 pg (ref 26.0–34.0)
MCHC: 34 g/dL (ref 30.0–36.0)
MCV: 86.8 fL (ref 80.0–100.0)
Monocytes Absolute: 1 10*3/uL (ref 0.1–1.0)
Monocytes Relative: 8 %
Neutro Abs: 9.5 10*3/uL — ABNORMAL HIGH (ref 1.7–7.7)
Neutrophils Relative %: 77 %
Platelets: 226 10*3/uL (ref 150–400)
RBC: 5.62 MIL/uL (ref 4.22–5.81)
RDW: 12.9 % (ref 11.5–15.5)
WBC: 12.4 10*3/uL — ABNORMAL HIGH (ref 4.0–10.5)
nRBC: 0 % (ref 0.0–0.2)

## 2020-11-05 LAB — BASIC METABOLIC PANEL
Anion gap: 9 (ref 5–15)
BUN: 8 mg/dL (ref 8–23)
CO2: 29 mmol/L (ref 22–32)
Calcium: 8.9 mg/dL (ref 8.9–10.3)
Chloride: 98 mmol/L (ref 98–111)
Creatinine, Ser: 1.11 mg/dL (ref 0.61–1.24)
GFR, Estimated: >60 mL/min (ref >60)
Glucose, Bld: 155 mg/dL — ABNORMAL HIGH (ref 70–99)
Potassium: 3.1 mmol/L — ABNORMAL LOW (ref 3.5–5.1)
Sodium: 136 mmol/L (ref 135–145)

## 2020-11-05 LAB — TROPONIN I (HIGH SENSITIVITY)
Troponin I (High Sensitivity): 3 ng/L (ref <18)
Troponin I (High Sensitivity): 4 ng/L (ref <18)

## 2020-11-05 LAB — BRAIN NATRIURETIC PEPTIDE: B Natriuretic Peptide: 29.1 pg/mL (ref 0.0–100.0)

## 2020-11-05 LAB — RESP PANEL BY RT-PCR (FLU A&B, COVID) ARPGX2
Influenza A by PCR: NEGATIVE
Influenza B by PCR: NEGATIVE
SARS Coronavirus 2 by RT PCR: NEGATIVE

## 2020-11-05 MED ORDER — ATORVASTATIN CALCIUM 40 MG PO TABS
40.0000 mg | ORAL_TABLET | Freq: Every day | ORAL | Status: DC
  Administered 2020-11-06 – 2020-11-07 (×2): 40 mg via ORAL
  Filled 2020-11-05 (×2): qty 1

## 2020-11-05 MED ORDER — HYDROCODONE-ACETAMINOPHEN 5-325 MG PO TABS
1.0000 | ORAL_TABLET | ORAL | Status: DC | PRN
  Administered 2020-11-06 (×2): 2 via ORAL
  Administered 2020-11-07: 1 via ORAL
  Filled 2020-11-05: qty 2
  Filled 2020-11-05: qty 1
  Filled 2020-11-05: qty 2

## 2020-11-05 MED ORDER — ONDANSETRON HCL 4 MG/2ML IJ SOLN: 4.0000 mg | Freq: Four times a day (QID) | INTRAMUSCULAR | Status: DC | PRN

## 2020-11-05 MED ORDER — BACLOFEN 10 MG PO TABS: 10.0000 mg | ORAL_TABLET | Freq: Every day | ORAL | Status: DC | PRN

## 2020-11-05 MED ORDER — METOPROLOL TARTRATE 25 MG PO TABS
25.0000 mg | ORAL_TABLET | Freq: Two times a day (BID) | ORAL | Status: DC
  Administered 2020-11-06 – 2020-11-07 (×4): 25 mg via ORAL
  Filled 2020-11-05 (×4): qty 1

## 2020-11-05 MED ORDER — METHYLPREDNISOLONE SODIUM SUCC 40 MG IJ SOLR
40.0000 mg | Freq: Four times a day (QID) | INTRAMUSCULAR | Status: DC
  Administered 2020-11-06 (×2): 40 mg via INTRAVENOUS
  Filled 2020-11-05 (×2): qty 1

## 2020-11-05 MED ORDER — METHYLPREDNISOLONE SODIUM SUCC 125 MG IJ SOLR
125.0000 mg | Freq: Once | INTRAMUSCULAR | Status: AC
  Administered 2020-11-05: 125 mg via INTRAVENOUS
  Filled 2020-11-05: qty 2

## 2020-11-05 MED ORDER — PANTOPRAZOLE SODIUM 40 MG PO TBEC
40.0000 mg | DELAYED_RELEASE_TABLET | Freq: Every day | ORAL | Status: DC
  Administered 2020-11-06 – 2020-11-07 (×2): 40 mg via ORAL
  Filled 2020-11-05 (×2): qty 1

## 2020-11-05 MED ORDER — ACETAMINOPHEN 325 MG PO TABS: 650.0000 mg | ORAL_TABLET | Freq: Four times a day (QID) | ORAL | Status: DC | PRN

## 2020-11-05 MED ORDER — POTASSIUM CHLORIDE CRYS ER 20 MEQ PO TBCR
40.0000 meq | EXTENDED_RELEASE_TABLET | Freq: Once | ORAL | Status: AC
  Administered 2020-11-06: 40 meq via ORAL
  Filled 2020-11-05: qty 2

## 2020-11-05 MED ORDER — AMITRIPTYLINE HCL 50 MG PO TABS: 25.0000 mg | ORAL_TABLET | Freq: Every evening | ORAL | Status: DC | PRN

## 2020-11-05 MED ORDER — ACETAMINOPHEN 650 MG RE SUPP: 650.0000 mg | Freq: Four times a day (QID) | RECTAL | Status: DC | PRN

## 2020-11-05 MED ORDER — IPRATROPIUM-ALBUTEROL 0.5-2.5 (3) MG/3ML IN SOLN
3.0000 mL | Freq: Four times a day (QID) | RESPIRATORY_TRACT | Status: DC
  Administered 2020-11-06 (×4): 3 mL via RESPIRATORY_TRACT
  Filled 2020-11-05 (×4): qty 3

## 2020-11-05 MED ORDER — ALBUTEROL SULFATE (2.5 MG/3ML) 0.083% IN NEBU: 2.5000 mg | INHALATION_SOLUTION | RESPIRATORY_TRACT | Status: DC | PRN

## 2020-11-05 MED ORDER — IPRATROPIUM-ALBUTEROL 0.5-2.5 (3) MG/3ML IN SOLN
3.0000 mL | Freq: Once | RESPIRATORY_TRACT | Status: AC
  Administered 2020-11-05: 3 mL via RESPIRATORY_TRACT
  Filled 2020-11-05: qty 3

## 2020-11-05 MED ORDER — SODIUM CHLORIDE 0.9 % IV SOLN
500.0000 mg | INTRAVENOUS | Status: DC
  Administered 2020-11-06: 500 mg via INTRAVENOUS
  Filled 2020-11-05: qty 500

## 2020-11-05 MED ORDER — IOHEXOL 350 MG/ML SOLN
100.0000 mL | Freq: Once | INTRAVENOUS | Status: AC | PRN
  Administered 2020-11-05: 92 mL via INTRAVENOUS

## 2020-11-05 MED ORDER — LORATADINE 10 MG PO TABS
10.0000 mg | ORAL_TABLET | Freq: Every evening | ORAL | Status: DC
  Administered 2020-11-06: 10 mg via ORAL
  Filled 2020-11-05: qty 1

## 2020-11-05 MED ORDER — AZITHROMYCIN 250 MG PO TABS: ORAL_TABLET | ORAL | 0 refills | Status: AC

## 2020-11-05 NOTE — ED Notes (Signed)
Patient placed on 2L [place].

## 2020-11-05 NOTE — ED Notes (Signed)
Report given to [facility] on 2W

## 2020-11-05 NOTE — ED Triage Notes (Signed)
Patient c/o SOB x 6 days.  Patient endorses productive cough w/ "grayish" sputum.   History of COPD.   Patient was prescribed Azithromycin but hasn't started this medication.   Patient is using Breztri and Albuterol inhaler.   Patient c/o bilateral shoulder, neck, and arm pain x 2 days ago.   Patient denies fall or trauma.   Patient states " I was in bed the other night and I felt like my head was going to explode".   History of Degenerative Disc Disease and has had previous surgery.

## 2020-11-05 NOTE — ED Notes (Signed)
Pt given sprite upon request

## 2020-11-05 NOTE — ED Notes (Signed)
Patient is being discharged from the Urgent Care and sent to the Emergency Department via private vehicle . Per Hallie,APP patient is in need of higher level of care due to COPD exacerbation with shortness of breath and hypoxia. Patient is aware and verbalizes understanding of plan of care.  Vitals:   11/05/20 1244  BP: (!) 145/90  Pulse: 100  Resp: (!) 23  Temp: 98.8 F (37.1 C)  SpO2: (!) 86%

## 2020-11-05 NOTE — Telephone Encounter (Addendum)
Spoke to patient via telephone.  Patient stated that he started mucinex 11/03/2020. He has had some improvement. He is now producing grey sputum, slightly worsened sob with exertion and right side arm and neck soreness. Sx have been present for 5 days He is taking mucinex once daily, xyzal once daily and Breztri BID. He has not used albuterol.  He will contact PCP regarding arm soreness.  Denied f/c/s or additional sx. He walked 75ft this morning and spo2 dropped to 84% on roomair. He recovered to 95% with pursed lip breathing.  He has supplemental oxygen but only uses it PRN.  Dr. Melvyn Novas, please advise. Thanks

## 2020-11-05 NOTE — ED Triage Notes (Signed)
He was seen at [facility] today for cough, sob x 6 days, bilateral shoulder, neck and arm pain x 2 days. Head ache x 2 nights. He has not had a recent Covid test.

## 2020-11-05 NOTE — Progress Notes (Signed)
Patient: Elijah, Marshall  DOB: 03/05/1950 [id number]:[id number] Transferring facility: [facility] Requesting provider: Theodis Blaze, PA (EDP at [facility]) Reason for transfer: admission for further evaluation and management of acute copd exac   [name], [name] is a 70 year old male with medical history notable for copd, non-small cell lung cancer, who presented to [facility] ED on 11/05/20 complaining of 2-3 days of sob.  He had initially presented to urgent care earlier today with this complaint, at which time his oxygen saturation was noted to be 86% on room air, in the context of no known baseline supplemental oxygen requirement, prompting him to be sent to [facility] ED for further evaluation.   At [facility], when taken off of supplemental O2, O2 sats noted again to be in the mid 80s on room air at rest, as well as in the low 80s on room air with ambulation.  Ensuing O2 sats 94 to 96% on 2 L nasal cannula.  Afebrile.   Chest x-ray showed no evidence of acute process, while CTA chest showed no evidence of acute PE.  COVID-19/influenza negative.   Received solumedrol and duo nebulizer treatment at [facility], but remains sob with the above persistent new supplemental oxygen requirement.   Subsequently, I accepted this patient for transfer for inpatient admission admission to a med telemetry bed at either [facility] or [facility] (first available bed) for further work-up and management of acute hypoxic respiratory distress in the setting of suspected acute COPD exacerbation.    Babs Bertin, DO Hospitalist

## 2020-11-05 NOTE — Telephone Encounter (Signed)
Zpak to see if mucus turns clearer - new rx   0ld recs he should be following  Albuterol is up to q 4 h prn sob   02 is prn sats < 90%    See avs: Plan A = Automatic = Always= Breztri Take 2 puffs first thing in am and then another 2 puffs about 12 hours later.      Plan B = Backup (to supplement plan A, not to replace it) Only use your albuterol inhaler as a rescue medication to be used if you can't catch your breath by resting or doing a relaxed purse lip breathing pattern.  - The less you use it, the better it will work when you need it. - Ok to use the inhaler up to 2 puffs  every 4 hours if you must but call for appointment if use goes up over your usual need - Don't leave home without it !!  (think of it like the spare tire for your car)    Make sure you check your oxygen saturations at highest level of activity to be sure it stays over 90% and adjust upward to maintain this level if needed but remember to turn it back to previous settings when you stop (to conserve your supply).

## 2020-11-05 NOTE — H&P (Signed)
History and Physical    PLEASE NOTE THAT DRAGON DICTATION SOFTWARE WAS USED IN THE CONSTRUCTION OF THIS NOTE.   Elijah Marshall UJW:[id number] DOB: 1950/07/28 DOA: 11/05/2020  PCP: [name], MD Patient coming from: home   I have personally briefly reviewed patient's old medical records in [place]  Chief Complaint: Shortness of breath  HPI: Elijah Marshall is a 70 y.o. male with medical history significant for COPD, non-small cell lung cancer, hypertension, hyperlipidemia, allergic rhinitis, who is admitted to [facility] on 11/05/2020 by way of transfer from [place] emergency department with acute COPD exacerbation after presenting from home to the latter facility complaining of shortness of breath.  The patient reports to 3 days of progressive shortness of breath associated with new onset nonproductive cough in the absence of any hemoptysis. Denies any associated orthopnea, PND, or new onset peripheral edema. No recent chest pain, diaphoresis, palpitations, N/V, pre-syncope, or syncope.  Not associate with any wheezing, new lower extremity erythema, or calf tenderness.  No recent trauma , travel, or surgical procedures, or periods of prolonged diminished ambulatory status.  No recent melena or hematochezia.  Denies any associated subjective fever, chills, rigors, or generalized myalgias.  No recent headache, neck stiffness, abdominal pain, diarrhea, or rash.  He reports mild rhinitis in the setting of a history of allergic rhinitis to seasonal allergies, which patient reports is no worse or qualitatively different than his baseline recurrent rhinitis at this time a year.  No recent known COVID-19 exposures.  Denies any recent dysuria, gross hematuria, or change in urinary urgency/frequency.  He confirms a history of COPD with good compliance on home respiratory regimen which consists of Breztri and as needed albuterol inhaler.  He reports increased use of his as  needed albuterol inhaler over the last few days in the setting of the aforementioned progressive shortness of breath, but noted no significant improvement thereof with this measure.  Conveys that he is a former smoker, having completely quit smoking in 2013 after previously smoking 1 pack/day over the preceding 30 to 35 years.  Denies any known baseline supplemental oxygen requirements.  He also conveys a history of non-small cell lung cancer involving the right lung, diagnosed in 2017.  In the setting of the above shortness of breath, the patient initially presented to local urgent care earlier today, at which time his resting oxygen saturation was noted to be 86% on room air, prompting him to be sent to [place] emergency department for further evaluation of this new supplemental oxygen requirement in the setting of presenting shortness of breath.    ED Course:  Vital signs in the ED were notable for the following: Temperature max 98.3; heart rate 91-100; blood pressure 133/85; respiratory rate 20-28; ambulatory oxygen saturation 83% on room air; resting oxygen saturation 85% on room air, which subsequently improved to 94 to 96% on 2 L nasal cannula.  Labs were notable for the following: BMP notable for the following: Sodium 136, potassium 3.1, bicarbonate 29, creatinine 1.11.  BNP 29, high-sensitivity troponin I x2 values were initially noted to be 3, with ensuing value found to be 4.  CBC notable for white blood cell count 12,400, hemoglobin 16.  COVID-19/influenza PCR were checked at [facility] ED today and found to be negative.  Imaging and additional notable ED work-up: EKG showed sinus rhythm with heart rate 99, and no evidence of T wave or ST changes, including  no evidence of ST elevation.  Chest x-ray showed no evidence of acute cardiopulmonary process, including no evidence of infiltrate, edema, effusion, or pneumothorax.  In the setting of presenting shortness of breath  with a history of small cell lung cancer, CTA chest was pursued and showed no evidence of acute intrathoracic process, including no evidence of acute pulmonary embolism, while also demonstrating no evidence of infiltrate, edema, effusion, or pneumothorax.  While in the ED, the following were administered: Solu-Medrol 125 mg IV x1, duo nebulizer treatment.  Consistently, the patient was transferred from [place] ED to [facility] for inpatient admission to the med telemetry unit for further evaluation management of suspected acute COPD exacerbation and associated acute hypoxic respiratory distress.     Review of Systems: As per HPI otherwise 10 point review of systems negative.   Past Medical History:  Diagnosis Date   Allergy    Arthritis    BPH with obstruction/lower urinary tract symptoms 2016   Nocturia   Cancer ([place])    unsure at this time   Cancer of lower lobe of right lung ([place])    COPD (chronic obstructive pulmonary disease) ([place])    per patient's health survey - he put a ?   Depression    Dysrhythmia    Erectile dysfunction due to arterial insufficiency    GERD (gastroesophageal reflux disease)    Hyperlipidemia    Hypertension    Non-small cell carcinoma of lung, right ([place]) 06/24/2015   Pneumonia    Shortness of breath    Ulcer     Past Surgical History:  Procedure Laterality Date   ANTERIOR FUSION LUMBAR SPINE  07/25/2012   ANTERIOR LAT LUMBAR FUSION Left 07/25/2012   Procedure: ANTERIOR LATERAL LUMBAR FUSION 1 LEVEL;  Surgeon: [name], MD;  Location: [place];  Service: Orthopedics;  Laterality: Left;  Left sided lumbar 3-4 lateral interbody fusion with allograft and instrumentation   BACK SURGERY     BIOPSY  04/16/2020   Procedure: BIOPSY;  Surgeon: Carol Ada, MD;  Location: [facility];  Service: Endoscopy;;   COLONOSCOPY  02/07/2012   polyps rem   ESOPHAGOGASTRODUODENOSCOPY N/A 07/27/2020   Procedure: ESOPHAGOGASTRODUODENOSCOPY  (EGD);  Surgeon: Carol Ada, MD;  Location: [place];  Service: Endoscopy;  Laterality: N/A;   ESOPHAGOGASTRODUODENOSCOPY (EGD) WITH PROPOFOL N/A 04/16/2020   Procedure: ESOPHAGOGASTRODUODENOSCOPY (EGD) WITH PROPOFOL;  Surgeon: Carol Ada, MD;  Location: [facility];  Service: Endoscopy;  Laterality: N/A;   JOINT REPLACEMENT Right 02/07/2008   right knee   NODE DISSECTION Right 05/24/2015   Procedure: NODE DISSECTION;  Surgeon: [name], MD;  Location: [place];  Service: Thoracic;  Laterality: Right;   SEGMENTECOMY Right 05/24/2015   Procedure: RIGHT LOWER LOBE SUPERIOR SEGMENTECTOMY;  Surgeon: [name], MD;  Location: [place];  Service: Thoracic;  Laterality: Right;   SPINE SURGERY  02/06/2010   SUBMUCOSAL TATTOO INJECTION  04/16/2020   Procedure: SUBMUCOSAL TATTOO INJECTION;  Surgeon: Carol Ada, MD;  Location: [facility];  Service: Endoscopy;;   UPPER ESOPHAGEAL ENDOSCOPIC ULTRASOUND (EUS) N/A 04/16/2020   Procedure: UPPER ESOPHAGEAL ENDOSCOPIC ULTRASOUND (EUS);  Surgeon: Carol Ada, MD;  Location: [facility];  Service: Endoscopy;  Laterality: N/A;   VIDEO ASSISTED THORACOSCOPY (VATS)/WEDGE RESECTION Right 05/24/2015   Procedure: RIGHT VIDEO ASSISTED THORACOSCOPY (VATS)/ RIGHTR LOWER LOBE WEDGE RESECTION;  Surgeon: [name], MD;  Location: [place];  Service: Thoracic;  Laterality: Right;    Social History:  reports that  he quit smoking about 9 years ago. His smoking use included cigarettes. He has a 35.00 pack-year smoking history. He has never used smokeless tobacco. He reports that he does not currently use alcohol. He reports that he does not use drugs.   Allergies  Allergen Reactions   Aspirin Nausea And Vomiting   Morphine And Related Itching    Family History  Problem Relation Age of Onset   Colon cancer Mother    Heart disease Father    Diabetes Sister    Hypertension Sister    Cancer Sister    Hypertension Brother    Heart  disease Maternal Grandmother        heart attack    Family history reviewed and not pertinent    Prior to Admission medications   Medication Sig Start Date End Date Taking? Authorizing Provider  albuterol (VENTOLIN HFA) 108 (90 Base) MCG/ACT inhaler Inhale 2 puffs into the lungs every 6 (six) hours as needed for wheezing or shortness of breath.    [provider]  amitriptyline (ELAVIL) 25 MG tablet TAKE 1 TABLET (25 MG TOTAL) BY MOUTH AT BEDTIME AS NEEDED FOR SLEEP. 04/01/18   Jacelyn Pi, Lilia Argue, MD  atorvastatin (LIPITOR) 40 MG tablet TAKE 1 TABLET BY MOUTH EVERY DAY Patient taking differently: Take 40 mg by mouth daily. 04/29/20   Patwardhan, Reynold Bowen, MD  azithromycin (ZITHROMAX) 250 MG tablet Take 2 tablets (500 mg) on  Day 1,  followed by 1 tablet (250 mg) once daily on Days 2 through 5. 11/05/20 11/10/20  [name], MD  baclofen (LIORESAL) 10 MG tablet Take 10 mg by mouth daily as needed for muscle spasms.    [provider]  BREZTRI AEROSPHERE 160-9-4.8 MCG/ACT AERO INHALE 2 PUFFS INTO THE LUNGS 2 TIMES DAILY Patient taking differently: Inhale 2 puffs into the lungs in the morning and at bedtime. 06/04/20   [name], MD  Budeson-Glycopyrrol-Formoterol (BREZTRI AEROSPHERE) 160-9-4.8 MCG/ACT AERO Inhale 2 puffs into the lungs in the morning and at bedtime. 10/01/20   [name], MD  famotidine (PEPCID) 40 MG tablet Take 1 tablet (40 mg total) by mouth 2 (two) times daily. Patient taking differently: Take 40 mg by mouth daily as needed for heartburn. 01/07/18   [name], MD  hydrochlorothiazide (HYDRODIURIL) 25 MG tablet Take 25 mg by mouth daily. 01/27/20   [provider]  HYDROcodone-acetaminophen (NORCO/VICODIN) 5-325 MG tablet Take 1-2 tablets by mouth every 4 (four) hours as needed for moderate pain or severe pain. 07/29/20   Stark Klein, MD  lansoprazole (PREVACID) 30 MG capsule TAKE 1 CAPSULE (30 MG TOTAL) BY MOUTH 2 (TWO) TIMES  DAILY. Patient taking differently: Take 30 mg by mouth 2 (two) times daily before a meal. 02/13/19   [name], MD  levocetirizine (XYZAL) 5 MG tablet Take 5 mg by mouth every evening.    [provider]  metoprolol tartrate (LOPRESSOR) 25 MG tablet Take 1 tablet (25 mg total) by mouth 2 (two) times daily. 07/30/20   Stark Klein, MD  Multiple Vitamin (MULTIVITAMIN ADULT PO) Take by mouth.    [provider]  OXYGEN Inhale 2 L into the lungs daily as needed (with exertion only).    [provider]  polyethylene glycol (MIRALAX / GLYCOLAX) 17 g packet Take 17 g by mouth daily.    [provider]  sildenafil (VIAGRA) 100 MG tablet Take 100 mg by mouth daily as needed for erectile  dysfunction.    [provider]     Objective    Physical Exam: Vitals:   11/05/20 1930 11/05/20 1945 11/05/20 2030 11/05/20 2224  BP: (!) 146/86 135/85 133/85 130/84  Pulse: 95 92 91 91  Resp: (!) 23 (!) 23 (!) 26 (!) 25  Temp:    97.9 F (36.6 C)  TempSrc:    Oral  SpO2: 96% 98% 94% 95%  Weight:      Height:        General: appears to be stated age; alert, oriented; mildly increased work of breathing noted Skin: warm, dry, no rash Head:  AT/[place] Mouth:  Oral mucosa membranes appear moist, normal dentition Neck: supple; trachea midline Heart:  RRR; did not appreciate any M/R/G Lungs: CTAB, did not appreciate any wheezes, rales, or rhonchi Abdomen: + BS; soft, ND, NT Vascular: 2+ pedal pulses b/l; 2+ radial pulses b/l Extremities: no peripheral edema, no muscle wasting Neuro: strength and sensation intact in upper and lower extremities b/l   Labs on Admission: I have personally reviewed following labs and imaging studies  CBC: Recent Labs  Lab 11/05/20 1440  WBC 12.4*  NEUTROABS 9.5*  HGB 16.6  HCT 48.8  MCV 86.8  PLT 528   Basic Metabolic Panel: Recent Labs  Lab 11/05/20 1440  NA 136  K 3.1*  CL 98  CO2 29  GLUCOSE 155*  BUN 8   CREATININE 1.11  CALCIUM 8.9   GFR: Estimated Creatinine Clearance: 62.8 mL/min (by C-G formula based on SCr of 1.11 mg/dL). Liver Function Tests: No results for input(s): AST, ALT, ALKPHOS, BILITOT, PROT, ALBUMIN in the last 168 hours. No results for input(s): LIPASE, AMYLASE in the last 168 hours. No results for input(s): AMMONIA in the last 168 hours. Coagulation Profile: No results for input(s): INR, PROTIME in the last 168 hours. Cardiac Enzymes: No results for input(s): CKTOTAL, CKMB, CKMBINDEX, TROPONINI in the last 168 hours. BNP (last 3 results) No results for input(s): PROBNP in the last 8760 hours. HbA1C: No results for input(s): HGBA1C in the last 72 hours. CBG: No results for input(s): GLUCAP in the last 168 hours. Lipid Profile: No results for input(s): CHOL, HDL, LDLCALC, TRIG, CHOLHDL, LDLDIRECT in the last 72 hours. Thyroid Function Tests: No results for input(s): TSH, T4TOTAL, FREET4, T3FREE, THYROIDAB in the last 72 hours. Anemia Panel: No results for input(s): VITAMINB12, FOLATE, FERRITIN, TIBC, IRON, RETICCTPCT in the last 72 hours. Urine analysis:    Component Value Date/Time   COLORURINE YELLOW 05/20/2015 1330   APPEARANCEUR CLEAR 05/20/2015 1330   LABSPEC 1.007 05/20/2015 1330   PHURINE 6.0 05/20/2015 1330   GLUCOSEU NEGATIVE 05/20/2015 1330   HGBUR NEGATIVE 05/20/2015 1330   BILIRUBINUR NEGATIVE 05/20/2015 1330   BILIRUBINUR negative 04/15/2015 0844   KETONESUR NEGATIVE 05/20/2015 1330   PROTEINUR NEGATIVE 05/20/2015 1330   UROBILINOGEN 0.2 04/15/2015 0844   UROBILINOGEN 0.2 07/18/2012 1330   NITRITE NEGATIVE 05/20/2015 1330   LEUKOCYTESUR NEGATIVE 05/20/2015 1330    Radiological Exams on Admission: CT Angio Chest PE W/Cm &/Or Wo Cm  Result Date: 11/05/2020 CLINICAL DATA:  Cough, short of breath for 6 days, headache, history of non-small cell lung cancer EXAM: CT ANGIOGRAPHY CHEST WITH CONTRAST TECHNIQUE: Multidetector CT imaging of the chest  was performed using the standard protocol during bolus administration of intravenous contrast. Multiplanar CT image reconstructions and MIPs were obtained to evaluate the vascular anatomy. CONTRAST:  37mL OMNIPAQUE IOHEXOL 350 MG/ML SOLN COMPARISON:  12/23/2019, 11/05/2020 FINDINGS:  Cardiovascular: This is a technically adequate evaluation of the pulmonary vasculature. No filling defects or pulmonary emboli. The heart is unremarkable without pericardial effusion. No evidence of thoracic aortic aneurysm or dissection. Minimal atherosclerosis of the aortic arch. Mediastinum/Nodes: No enlarged mediastinal, hilar, or axillary lymph nodes. Thyroid gland, trachea, and esophagus demonstrate no significant findings. Lungs/Pleura: Upper lobe predominant emphysema. No acute airspace disease, effusion, or pneumothorax. Postsurgical changes at the right lung base. Central airways are widely patent. Upper Abdomen: No acute abnormality. Musculoskeletal: No acute or destructive bony lesions. Reconstructed images demonstrate no additional findings. Review of the MIP images confirms the above findings. IMPRESSION: 1. No evidence of pulmonary embolus. 2. No acute intrathoracic process. 3. Aortic Atherosclerosis (ICD10-I70.0) and Emphysema (ICD10-J43.9). Electronically Signed   By: Randa Ngo M.D.   On: 11/05/2020 17:37   DG Chest Portable 1 View  Result Date: 11/05/2020 CLINICAL DATA:  Chest pain. EXAM: PORTABLE CHEST 1 VIEW COMPARISON:  Chest x-ray 05/23/2016.  CT chest 12/23/2019. FINDINGS: The cardiomediastinal silhouette is within normal limits. Again seen is some scarring in the right lung base. Surgical suture lines are again noted in the right mid lung. There is no new focal lung infiltrate, pleural effusion or pneumothorax. No acute fractures are seen. IMPRESSION: No active disease. Stable postoperative changes and scarring in the right lower lung. Electronically Signed   By: Ronney Asters M.D.   On: 11/05/2020 15:11      EKG: Independently reviewed, with result as described above.    Assessment/Plan   Elijah Marshall is a 70 y.o. male with medical history significant for COPD, non-small cell lung cancer, hypertension, hyperlipidemia, allergic rhinitis, who is admitted to [facility] on 11/05/2020 by way of transfer from [place] emergency department with acute COPD exacerbation after presenting from home to the latter facility complaining of shortness of breath.   Principal Problem:   Acute exacerbation of chronic obstructive pulmonary disease (COPD) (HCC) Active Problems:   GERD (gastroesophageal reflux disease)   Hyperlipidemia   Essential hypertension   Acute respiratory failure with hypoxia (HCC)   SOB (shortness of breath)   Hypokalemia   Allergic rhinitis      #) Acute COPD exacerbation: in the context of a documented history of COPD, diagnosis of acute exacerbation on the basis of presenting to 3 days of progressive shortness of breath associated with new onset nonproductive cough, evidence of increased work of breathing, as well is new supplemental oxygen requirement consistent with acute hypoxic respiratory distress, as further quantified below, with presenting chest x-ray showing no evidence of acute process and CTA chest showing no evidence of acute intrathoracic process, including no evidence of pulmonary embolism.  Etiology leading to acute exacerbation currently unclear, as the patient reports good compliance with his home respiratory regimen, as outlined above, and confirms that he completely quit smoking back in 2013, without subsequent resumption.  COVID-19/influenza PCR were checked earlier today and found to be negative.  of note, no clinical or radiographic evidence to suggest acutely decompensated heart failure at this time, including nonelevated BNP. Additionally, ACS appears less likely in the absence of chest pain, with EKG showing no evidence of acute  ischemic changes, and with negative Troponin results.     Plan: monitor continuous pulse oxymetry. Monitor on telemetry. Solumedrol  Scheduled duonebs q6 hours. Prn albuterol inhaler. Will start Azithromycin for benefit of shortened duration of hospitalization associated with antibiotic initiation in the setting of acute COPD exacerbation. BMP in the  morning. Repeat CBC in the morning. Check serum Mg and Phos levels. Will attempt additional chart review to evaluate most recent PFT results.  Check VBG.  Add on procalcitonin level, although underlying pneumonia is less likely given radiographic negative appearance of such on presenting CT chest, as above.      #) Acute hypoxic respiratory distress: in the context of no known baseline supplemental oxygen requirements, presenting O2 sat in the mid 80s on room air, with ensuing improvement into the mid 90s on 2 L nasal cannula, thereby meeting criteria for acute hypoxic respiratory distress as opposed to acute hypoxic respiratory failure at this time.  This appears to be on the basis of presenting acute COPD exacerbation, as further detailed above, with presenting chest x-ray and CTA chest showing no evidence of acute process, including no evidence of pulmonary embolism, infiltrate, edema, effusion, or pneumothorax.  ACS is felt to be less likely at this time in the absence of any recent chest pain and in the context of negative troponin x 2 and presenting EKG showing no evidence of acute ischemic process.  We will further trend troponin to further assess.  No clinical or radiographic evidence of suggest acutely decompensated heart failure at this time. COVID-19 and Influenza PCR were found to be negative when checked today.  Radiographically no evidence of pneumonia, and clinical presentation is also less suggestive of this.    Plan: further evaluation and management of presenting acute COPD exacerbation, as above, including monitoring of continuous pulse  oximetry with prn supplemental O2 to maintain O2 sats greater than or equal to 92%. monitor on telemetry, solumedrol, scheduled duo nebulizer treatments, and as needed albuterol nebulizer. Check CMP and CBC in the morning. Check serum Mg and Phos levels.  Check VBG.  Incentive spirometry.  Continue to trend troponin, as above.     #) Hypokalemia: Presenting serum potassium noted to be 3.1.  Plan: Potassium chloride 40 mill colons p.o. x1 dose now.  Add on serum magnesium level.  Repeat BMP in the morning.  Monitor on telemetry.      #) GERD: On scheduled lansoprazole as an outpatient.  Plan: Continue home lansoprazole.      #) Essential hypertension: Documented history of such, on HCTZ and Lopressor as an outpatient.  Presenting blood pressures noted to be normotensive.   Plan: Continue home Lopressor, but hold home HCTZ for now.  Close monitoring of ensuing blood pressure via routine vital signs.      #) Hyperlipidemia: On high intensity atorvastatin as an outpatient.  Plan: Continue home statin.      #) Allergic rhinitis: Documented history of such in the setting of seasonal allergies, on levocetirizine on a scheduled basis at home.  Of note, not on any intranasal steroids as an outpatient, which would be first-line intervention for allergic rhinitis.  Plan: Continue levocetirizine.     DVT prophylaxis: SCDs Code Status: Full code Family Communication: none Disposition Plan: Per Rounding Team Consults called: none;  Admission status: Inpatient; med telemetry     Of note, this patient was added by me to the following Admit List/Treatment Team: [email or web address].    Of note, the Adult Admission Order Set (Multimorbid order set) was used by me in the admission process for this patient.   PLEASE NOTE THAT DRAGON DICTATION SOFTWARE WAS USED IN THE CONSTRUCTION OF THIS NOTE.   [facility] Pager [phone] From [place]  Otherwise,  please contact night-coverage  [email or web address] Password  [email or web address]   11/05/2020, 10:33 PM

## 2020-11-05 NOTE — Addendum Note (Signed)
Addended by: Claudette Head A on: 11/05/2020 10:55 AM   Modules accepted: Orders

## 2020-11-05 NOTE — ED Provider Notes (Signed)
[facility] Provider Note   CSN: [id number] Arrival date & time: 11/05/20  1351     History Chief Complaint  Patient presents with   Cough    Elijah Marshall is a 70 y.o. male.  With past medical history of COPD, non-small cell lung cancer in 2017's s/p superior segmentectomy of the right lower lobe, hypertension who presents to the emergency department with shortness of breath.  States that he has been short of breath for the past 2 to 3 days, worsening this morning.  States that over the past 2 to 3 days he has been coughing up yellow phlegm and having soreness in his bilateral shoulders and across his neck that he describes as achiness.  States that he has had a scratchy throat.  Family member at bedside states over the past 2 to 3 days he has had increasing shortness of breath on exertion and using his home oxygen more frequently.  Denies fevers, hemoptysis, recent traveling, lower extremity swelling, chest pain, headache, abdominal pain.  He was seen at urgent care today and was instructed to present to the emergency department for hypoxia to 86%.  Of note he states that he only uses oxygen as needed at home.  States that he takes his oxygen levels daily and usually runs anywhere between 93 and 95% on room air.   Cough Associated symptoms: myalgias and shortness of breath   Associated symptoms: no chest pain and no fever       Past Medical History:  Diagnosis Date   Allergy    Arthritis    BPH with obstruction/lower urinary tract symptoms 2016   Nocturia   Cancer ([place])    unsure at this time   Cancer of lower lobe of right lung ([place])    COPD (chronic obstructive pulmonary disease) ([place])    per patient's health survey - he put a ?   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   Paroxysmal tachycardia, unspecified ([place]) 03/17/2020   Atypical atrial flutter ([place]) 03/10/2020   Coronary arteriosclerosis in native artery 01/07/2018   Primary insomnia 01/07/2018   Rectal bleeding 01/07/2018   Chronic arthralgias of knees and hips 01/07/2018   Chronic pain syndrome 01/07/2018   Cough with hemoptysis 09/08/2017   Unexplained night sweats 04/30/2016   Chronic respiratory failure with hypoxia ([place]) 11/19/2015   Arthritis 11/11/2015   Depression 11/11/2015   Status post lobectomy of lung 07/05/2015   Essential hypertension 07/05/2015   Non-small cell carcinoma of lung, right ([place]) 06/24/2015   Hyperlipidemia 04/20/2015   Chronic infection of sinus 04/20/2015   GERD (gastroesophageal reflux disease) 06/08/2014   Chronic rhinitis 10/30/2012   COPD GOLD III 09/26/2011    Past Surgical History:  Procedure Laterality Date   ANTERIOR FUSION LUMBAR SPINE  07/25/2012   ANTERIOR LAT LUMBAR FUSION Left 07/25/2012   Procedure: ANTERIOR LATERAL LUMBAR FUSION 1 LEVEL;  Surgeon: [name], MD;  Location: [facility];  Service: Orthopedics;  Laterality: Left;  Left sided lumbar 3-4 lateral interbody fusion with allograft and instrumentation   BACK SURGERY  BIOPSY  04/16/2020   Procedure: BIOPSY;  Surgeon: Carol Ada, MD;  Location: [facility];  Service: Endoscopy;;   COLONOSCOPY  02/07/2012   polyps rem   ESOPHAGOGASTRODUODENOSCOPY N/A 07/27/2020   Procedure: ESOPHAGOGASTRODUODENOSCOPY (EGD);  Surgeon: Carol Ada, MD;  Location: [facility];  Service: Endoscopy;  Laterality: N/A;   ESOPHAGOGASTRODUODENOSCOPY (EGD) WITH PROPOFOL N/A 04/16/2020   Procedure: ESOPHAGOGASTRODUODENOSCOPY (EGD) WITH PROPOFOL;  Surgeon: Carol Ada, MD;  Location: [facility];  Service: Endoscopy;  Laterality: N/A;   JOINT REPLACEMENT Right  02/07/2008   right knee   NODE DISSECTION Right 05/24/2015   Procedure: NODE DISSECTION;  Surgeon: [name], MD;  Location: [facility];  Service: Thoracic;  Laterality: Right;   SEGMENTECOMY Right 05/24/2015   Procedure: RIGHT LOWER LOBE SUPERIOR SEGMENTECTOMY;  Surgeon: [name], MD;  Location: [facility];  Service: Thoracic;  Laterality: Right;   SPINE SURGERY  02/06/2010   SUBMUCOSAL TATTOO INJECTION  04/16/2020   Procedure: SUBMUCOSAL TATTOO INJECTION;  Surgeon: Carol Ada, MD;  Location: [facility];  Service: Endoscopy;;   UPPER ESOPHAGEAL ENDOSCOPIC ULTRASOUND (EUS) N/A 04/16/2020   Procedure: UPPER ESOPHAGEAL ENDOSCOPIC ULTRASOUND (EUS);  Surgeon: Carol Ada, MD;  Location: [facility];  Service: Endoscopy;  Laterality: N/A;   VIDEO ASSISTED THORACOSCOPY (VATS)/WEDGE RESECTION Right 05/24/2015   Procedure: RIGHT VIDEO ASSISTED THORACOSCOPY (VATS)/ RIGHTR LOWER LOBE WEDGE RESECTION;  Surgeon: [name], MD;  Location: [facility];  Service: Thoracic;  Laterality: Right;     Family History  Problem Relation Age of Onset   Colon cancer Mother    Heart disease Father    Diabetes Sister    Hypertension Sister    Cancer Sister    Hypertension Brother    Heart disease Maternal Grandmother        heart attack    Social History   Tobacco Use   Smoking status: Former    Packs/day: 1.00    Years: 35.00    Pack years: 35.00    Types: Cigarettes    Quit date: 09/24/2011    Years since quitting: 9.1   Smokeless tobacco: Never  Vaping Use   Vaping Use: Never used  Substance Use Topics   Alcohol use: Not Currently    Comment: very seldom   Drug use: No    Home Medications Prior to Admission medications   Medication Sig Start Date End Date Taking? Authorizing Provider  albuterol (VENTOLIN HFA) 108 (90 Base) MCG/ACT inhaler Inhale 2 puffs into the lungs every 6 (six) hours as needed for wheezing or shortness of breath.    [provider]   amitriptyline (ELAVIL) 25 MG tablet TAKE 1 TABLET (25 MG TOTAL) BY MOUTH AT BEDTIME AS NEEDED FOR SLEEP. 04/01/18   Jacelyn Pi, Lilia Argue, MD  atorvastatin (LIPITOR) 40 MG tablet TAKE 1 TABLET BY MOUTH EVERY DAY Patient taking differently: Take 40 mg by mouth daily. 04/29/20   Patwardhan, Reynold Bowen, MD  azithromycin (ZITHROMAX) 250 MG tablet Take 2 tablets (500 mg) on  Day 1,  followed by 1 tablet (250 mg) once daily on Days 2 through 5. 11/05/20 11/10/20  [name], MD  baclofen (LIORESAL) 10 MG tablet Take 10 mg by mouth daily as needed for muscle spasms.    [provider]  BREZTRI AEROSPHERE 160-9-4.8 MCG/ACT AERO INHALE 2 PUFFS INTO THE LUNGS 2 TIMES DAILY Patient taking differently: Inhale 2 puffs into the lungs in the morning and at bedtime. 06/04/20   [name], MD  Budeson-Glycopyrrol-Formoterol (BREZTRI AEROSPHERE) 160-9-4.8 MCG/ACT AERO Inhale 2 puffs into the lungs in the morning and at bedtime. 10/01/20   [name], MD  famotidine (PEPCID) 40 MG tablet Take 1 tablet (40 mg total) by mouth 2 (two) times daily. Patient taking differently: Take 40 mg by mouth daily as needed for heartburn. 01/07/18   [name], MD  hydrochlorothiazide (HYDRODIURIL) 25 MG tablet Take 25 mg by mouth daily. 01/27/20   [provider]  HYDROcodone-acetaminophen (NORCO/VICODIN) 5-325 MG tablet Take 1-2 tablets by mouth every 4 (four) hours as needed for moderate pain or severe pain. 07/29/20   Stark Klein, MD  lansoprazole (PREVACID) 30 MG capsule TAKE 1 CAPSULE (30 MG TOTAL) BY MOUTH 2 (TWO) TIMES DAILY. Patient taking differently: Take 30 mg by mouth 2 (two) times daily before a meal. 02/13/19   [name], MD  levocetirizine (XYZAL) 5 MG tablet Take 5 mg by mouth every evening.    [provider]  metoprolol tartrate (LOPRESSOR) 25 MG tablet Take 1 tablet (25 mg total) by mouth 2 (two) times daily. 07/30/20   Stark Klein, MD  Multiple Vitamin (MULTIVITAMIN ADULT  PO) Take by mouth.    [provider]  OXYGEN Inhale 2 L into the lungs daily as needed (with exertion only).    [provider]  polyethylene glycol (MIRALAX / GLYCOLAX) 17 g packet Take 17 g by mouth daily.    [provider]  sildenafil (VIAGRA) 100 MG tablet Take 100 mg by mouth daily as needed for erectile dysfunction.    [provider]    Allergies    Aspirin and Morphine and related  Review of Systems   Review of Systems  Constitutional:  Positive for fatigue. Negative for fever.  Respiratory:  Positive for cough and shortness of breath.   Cardiovascular:  Negative for chest pain, palpitations and leg swelling.  Gastrointestinal:  Negative for abdominal pain.  Musculoskeletal:  Positive for myalgias.  Neurological:  Negative for dizziness and light-headedness.   Physical Exam Updated Vital Signs BP (!) 144/90   Pulse (!) 117   Temp 98.3 F (36.8 C) (Oral)   Resp (!) 29   Ht 5\' 9"  (1.753 m)   Wt 76.3 kg   SpO2 94%   BMI 24.84 kg/m   Physical Exam Vitals and nursing note reviewed.  Constitutional:      General: He is not in acute distress.    Appearance: Normal appearance. He is not toxic-appearing.  HENT:     Head: Normocephalic and atraumatic.     Nose: Nose normal.     Mouth/Throat:     Mouth: Mucous membranes are moist.     Pharynx: Oropharynx is clear.  Eyes:     General: No scleral icterus.    Extraocular Movements: Extraocular movements intact.     Conjunctiva/sclera: Conjunctivae normal.     Pupils: Pupils are equal, round, and reactive to light.  Cardiovascular:     Rate and Rhythm: Regular rhythm. Tachycardia present.     Pulses: Normal pulses.     Heart sounds: Normal heart sounds. No murmur heard. Pulmonary:     Effort: Pulmonary effort is normal. No respiratory distress.     Breath sounds: Examination of the right-lower field reveals decreased breath sounds. Examination of the left-lower field reveals  wheezing. Decreased breath sounds and wheezing present.  Abdominal:     General: Bowel sounds are normal.     Palpations: Abdomen is soft.  Tenderness: There is no abdominal tenderness.  Musculoskeletal:        General: Normal range of motion.     Cervical back: Normal range of motion and neck supple. No tenderness.     Right lower leg: No edema.     Left lower leg: No edema.  Lymphadenopathy:     Cervical: No cervical adenopathy.  Skin:    General: Skin is warm and dry.     Capillary Refill: Capillary refill takes less than 2 seconds.  Neurological:     General: No focal deficit present.     Mental Status: He is alert and oriented to person, place, and time. Mental status is at baseline.  Psychiatric:        Mood and Affect: Mood normal.        Behavior: Behavior normal.    ED Results / Procedures / Treatments   Labs (all labs ordered are listed, but only abnormal results are displayed) Labs Reviewed  CBC WITH DIFFERENTIAL/PLATELET - Abnormal; Notable for the following components:      Result Value   WBC 12.4 (*)    Neutro Abs 9.5 (*)    All other components within normal limits  BASIC METABOLIC PANEL - Abnormal; Notable for the following components:   Potassium 3.1 (*)    Glucose, Bld 155 (*)    All other components within normal limits  RESP PANEL BY RT-PCR (FLU A&B, COVID) [id number]  BRAIN NATRIURETIC PEPTIDE  TROPONIN I (HIGH SENSITIVITY)  TROPONIN I (HIGH SENSITIVITY)   EKG EKG Interpretation  Date/Time:  Friday November 05 2020 14:23:57 EDT Ventricular Rate:  99 PR Interval:  202 QRS Duration: 94 QT Interval:  348 QTC Calculation: 447 R Axis:   -49 Text Interpretation: Sinus rhythm LAD, consider left anterior fascicular block Confirmed by Fredia Sorrow [phone]) on 11/05/2020 4:48:49 PM  Radiology CT Angio Chest PE W/Cm &/Or Wo Cm  Result Date: 11/05/2020 CLINICAL DATA:  Cough, short of breath for 6 days, headache, history of non-small cell lung cancer  EXAM: CT ANGIOGRAPHY CHEST WITH CONTRAST TECHNIQUE: Multidetector CT imaging of the chest was performed using the standard protocol during bolus administration of intravenous contrast. Multiplanar CT image reconstructions and MIPs were obtained to evaluate the vascular anatomy. CONTRAST:  20mL OMNIPAQUE IOHEXOL 350 MG/ML SOLN COMPARISON:  12/23/2019, 11/05/2020 FINDINGS: Cardiovascular: This is a technically adequate evaluation of the pulmonary vasculature. No filling defects or pulmonary emboli. The heart is unremarkable without pericardial effusion. No evidence of thoracic aortic aneurysm or dissection. Minimal atherosclerosis of the aortic arch. Mediastinum/Nodes: No enlarged mediastinal, hilar, or axillary lymph nodes. Thyroid gland, trachea, and esophagus demonstrate no significant findings. Lungs/Pleura: Upper lobe predominant emphysema. No acute airspace disease, effusion, or pneumothorax. Postsurgical changes at the right lung base. Central airways are widely patent. Upper Abdomen: No acute abnormality. Musculoskeletal: No acute or destructive bony lesions. Reconstructed images demonstrate no additional findings. Review of the MIP images confirms the above findings. IMPRESSION: 1. No evidence of pulmonary embolus. 2. No acute intrathoracic process. 3. Aortic Atherosclerosis (ICD10-I70.0) and Emphysema (ICD10-J43.9). Electronically Signed   By: Randa Ngo M.D.   On: 11/05/2020 17:37   DG Chest Portable 1 View  Result Date: 11/05/2020 CLINICAL DATA:  Chest pain. EXAM: PORTABLE CHEST 1 VIEW COMPARISON:  Chest x-ray 05/23/2016.  CT chest 12/23/2019. FINDINGS: The cardiomediastinal silhouette is within normal limits. Again seen is some scarring in the right lung base. Surgical suture lines are again noted in the right mid lung.  There is no new focal lung infiltrate, pleural effusion or pneumothorax. No acute fractures are seen. IMPRESSION: No active disease. Stable postoperative changes and scarring in  the right lower lung. Electronically Signed   By: Ronney Asters M.D.   On: 11/05/2020 15:11    Procedures Procedures   Medications Ordered in ED Medications  ipratropium-albuterol (DUONEB) 0.5-2.5 (3) MG/3ML nebulizer solution 3 mL (has no administration in time range)  methylPREDNISolone sodium succinate (SOLU-MEDROL) 125 mg/2 mL injection 125 mg (has no administration in time range)    ED Course  I have reviewed the triage vital signs and the nursing notes.  Pertinent labs & imaging results that were available during my care of the patient were reviewed by me and considered in my medical decision making (see chart for details).  Patient out of bed to urinate and had increasing shortness of breath with O2 sat down to 83% on room air.  Placed on 2 L nasal cannula. MDM Rules/Calculators/A&P 70 year old male who presents emergency department with shortness of breath.  Presentation with dyspnea likely secondary to COPD exacerbation given increasing shortness of breath, hypoxia, increasing sputum production. Chest x-ray without pneumothorax or overt pneumonia. Troponins negative, EKG without ischemia or infarction so doubt ACS as a component of shortness of breath. COVID/flu negative so doubt as precipitant to SOB   Initially tachycardic to the 110s on presentation with hypoxia and history of cancer will CTA PE study. PE negative for acute embolism.  Also did not pick up on any covert pneumonia not seen on chest x-ray. Ambulated patient with O2 monitoring, had desat to mid 67s.  He also becomes more short of breath when mildly exerting himself.  Given that he has had a new oxygen requirement and increasing shortness of breath will admit him for likely COPD exacerbation.  I discussed this case with my attending physician who cosigned this note including patient's presenting symptoms, physical exam, and planned diagnostics and interventions. Attending physician stated agreement with plan or  made changes to plan which were implemented.   Attending physician assessed patient at bedside.   1900: Spoke with Dr. Paul Dykes, hospitalist who agrees to admit patient to [facility] for COPD Exacerbation and new oxygen requirements.  Final Clinical Impression(s) / ED Diagnoses Final diagnoses:  Shortness of breath    Rx / DC Orders ED Discharge Orders     None        Mickie Hillier, PA-C 11/05/20 1901    Lucrezia Starch, MD 11/08/20 1816

## 2020-11-05 NOTE — ED Notes (Signed)
Report given to [facility] with [facility]

## 2020-11-05 NOTE — ED Notes (Signed)
Pt given meal tray.

## 2020-11-05 NOTE — ED Notes (Addendum)
Assumed care from [employer]. Patient laying quietly on gurney. No acute distress noted. Patient updated on plan of care. Will continue to monitor.   1629: RT at bedside for evaluation of patient and breathing treatment administration. No acute distress noted.   1759: Patient ambulated with pulse ox without o2 - saturations dropped to 89%. Patient winded after ambulating. Will continue to monitor. PA made aware.   1922: Pending admission to hospital. Patient updated on plan of care. Report given to [facility] for continuation of care.

## 2020-11-05 NOTE — Telephone Encounter (Signed)
Patient is aware of recommendations and voiced his understanding.  Zpak has been sent to preferred pharmacy.  Patient is aware and voiced his understanding.  Nothing further needed at this time.

## 2020-11-05 NOTE — ED Notes (Signed)
[facility] here to transport pt 

## 2020-11-05 NOTE — Progress Notes (Signed)
New patient arrived from [facility] via stretcher. PT admitted to [facility] 2W26. A&Ox4, PT is ambulatory, on 2L [address]. Admitting MD has been paged.

## 2020-11-06 DIAGNOSIS — R0602 Shortness of breath: Secondary | ICD-10-CM | POA: Diagnosis present

## 2020-11-06 DIAGNOSIS — J309 Allergic rhinitis, unspecified: Secondary | ICD-10-CM | POA: Diagnosis present

## 2020-11-06 DIAGNOSIS — J9601 Acute respiratory failure with hypoxia: Secondary | ICD-10-CM | POA: Diagnosis present

## 2020-11-06 DIAGNOSIS — E876 Hypokalemia: Secondary | ICD-10-CM | POA: Diagnosis present

## 2020-11-06 LAB — CBC WITH DIFFERENTIAL/PLATELET
Abs Immature Granulocytes: 0.08 10*3/uL — ABNORMAL HIGH (ref 0.00–0.07)
Basophils Absolute: 0 10*3/uL (ref 0.0–0.1)
Basophils Relative: 0 %
Eosinophils Absolute: 0 10*3/uL (ref 0.0–0.5)
Eosinophils Relative: 0 %
HCT: 48.7 % (ref 39.0–52.0)
Hemoglobin: 16.7 g/dL (ref 13.0–17.0)
Immature Granulocytes: 1 %
Lymphocytes Relative: 7 %
Lymphs Abs: 0.9 10*3/uL (ref 0.7–4.0)
MCH: 29.4 pg (ref 26.0–34.0)
MCHC: 34.3 g/dL (ref 30.0–36.0)
MCV: 85.7 fL (ref 80.0–100.0)
Monocytes Absolute: 0.2 10*3/uL (ref 0.1–1.0)
Monocytes Relative: 1 %
Neutro Abs: 11.6 10*3/uL — ABNORMAL HIGH (ref 1.7–7.7)
Neutrophils Relative %: 91 %
Platelets: 241 10*3/uL (ref 150–400)
RBC: 5.68 MIL/uL (ref 4.22–5.81)
RDW: 12.8 % (ref 11.5–15.5)
WBC: 12.8 10*3/uL — ABNORMAL HIGH (ref 4.0–10.5)
nRBC: 0 % (ref 0.0–0.2)

## 2020-11-06 LAB — BLOOD GAS, VENOUS
Acid-Base Excess: 2.4 mmol/L — ABNORMAL HIGH (ref 0.0–2.0)
Bicarbonate: 26.5 mmol/L (ref 20.0–28.0)
Drawn by: 6344
O2 Saturation: 88.2 %
Patient temperature: 37
pCO2, Ven: 41.4 mmHg — ABNORMAL LOW (ref 44.0–60.0)
pH, Ven: 7.422 (ref 7.250–7.430)
pO2, Ven: 57.1 mmHg — ABNORMAL HIGH (ref 32.0–45.0)

## 2020-11-06 LAB — COMPREHENSIVE METABOLIC PANEL
ALT: 18 U/L (ref 0–44)
AST: 25 U/L (ref 15–41)
Albumin: 3.7 g/dL (ref 3.5–5.0)
Alkaline Phosphatase: 108 U/L (ref 38–126)
Anion gap: 11 (ref 5–15)
BUN: 8 mg/dL (ref 8–23)
CO2: 25 mmol/L (ref 22–32)
Calcium: 9.2 mg/dL (ref 8.9–10.3)
Chloride: 98 mmol/L (ref 98–111)
Creatinine, Ser: 1.11 mg/dL (ref 0.61–1.24)
GFR, Estimated: >60 mL/min (ref >60)
Glucose, Bld: 166 mg/dL — ABNORMAL HIGH (ref 70–99)
Potassium: 3.6 mmol/L (ref 3.5–5.1)
Sodium: 134 mmol/L — ABNORMAL LOW (ref 135–145)
Total Bilirubin: 1.1 mg/dL (ref 0.3–1.2)
Total Protein: 7 g/dL (ref 6.5–8.1)

## 2020-11-06 LAB — TROPONIN I (HIGH SENSITIVITY): Troponin I (High Sensitivity): 4 ng/L (ref <18)

## 2020-11-06 LAB — MAGNESIUM
Magnesium: 2 mg/dL (ref 1.7–2.4)
Magnesium: 2 mg/dL (ref 1.7–2.4)

## 2020-11-06 LAB — PROCALCITONIN: Procalcitonin: <0.1 ng/mL

## 2020-11-06 LAB — HIV ANTIBODY (ROUTINE TESTING W REFLEX): HIV Screen 4th Generation wRfx: NONREACTIVE

## 2020-11-06 LAB — PHOSPHORUS: Phosphorus: 2.6 mg/dL (ref 2.5–4.6)

## 2020-11-06 MED ORDER — IPRATROPIUM-ALBUTEROL 0.5-2.5 (3) MG/3ML IN SOLN
3.0000 mL | Freq: Two times a day (BID) | RESPIRATORY_TRACT | Status: DC
  Administered 2020-11-07: 3 mL via RESPIRATORY_TRACT
  Filled 2020-11-06: qty 3

## 2020-11-06 MED ORDER — POLYETHYLENE GLYCOL 3350 17 G PO PACK: 17.0000 g | PACK | Freq: Every day | ORAL | Status: DC

## 2020-11-06 MED ORDER — AZITHROMYCIN 250 MG PO TABS
500.0000 mg | ORAL_TABLET | Freq: Every day | ORAL | Status: DC
  Administered 2020-11-06: 500 mg via ORAL
  Filled 2020-11-06: qty 2

## 2020-11-06 MED ORDER — FLUTICASONE PROPIONATE 50 MCG/ACT NA SUSP
1.0000 | Freq: Every day | NASAL | Status: DC
  Administered 2020-11-06: 1 via NASAL
  Filled 2020-11-06: qty 16

## 2020-11-06 MED ORDER — METHYLPREDNISOLONE SODIUM SUCC 40 MG IJ SOLR
40.0000 mg | Freq: Two times a day (BID) | INTRAMUSCULAR | Status: DC
  Administered 2020-11-06: 40 mg via INTRAVENOUS
  Filled 2020-11-06: qty 1

## 2020-11-06 NOTE — Progress Notes (Signed)
PROGRESS NOTE    Elijah Marshall  CBJ:[id number] DOB: November 06, 1950 DOA: 11/05/2020 PCP: [name], MD   Brief Narrative:  Elijah Marshall is a 70 y.o. male with medical history significant for COPD, non-small cell lung cancer, hypertension, hyperlipidemia, allergic rhinitis, who is admitted to [facility] on 11/05/2020 by way of transfer from [place] emergency department with acute COPD exacerbation after presenting from home to the latter facility complaining of shortness of breath.  Upon arrival to ED, he was slightly tachypneic with oxygen saturation 83% on room air requiring 2 L of oxygen.  Mild hypokalemia.  Troponin x2 within normal range.  COVID and influenza negative. Chest x-ray showed no evidence of acute cardiopulmonary process  CTA chest showed no evidence of acute intrathoracic process, including no evidence of acute pulmonary embolism.  He was given Solu-Medrol and DuoNeb in the ED and admitted under [place].    Assessment & Plan:   Principal Problem:   Acute exacerbation of chronic obstructive pulmonary disease (COPD) (HCC) Active Problems:   GERD (gastroesophageal reflux disease)   Hyperlipidemia   Essential hypertension   Acute respiratory failure with hypoxia (HCC)   SOB (shortness of breath)   Hypokalemia   Allergic rhinitis  Acute hypoxic respiratory failure secondary to acute COPD exacerbation: Still with some shortness of breath but better than yesterday.  Still requiring 2 L oxygen.  Diminished breath sounds but no wheezes on exam today.  We will continue Solu-Medrol, Zithromax and bronchodilators and try to wean oxygen.  Potential discharge tomorrow.  Hypokalemia: Resolved.  GERD: Continue PPI.  Essential hypertension: Controlled, continue current medications.  Hyperlipidemia: Continue statin.  DVT prophylaxis: SCDs Start: 11/05/20 2227   Code Status: Full Code  Family Communication:  None present at bedside.  Plan of care discussed with  patient in length and he verbalized understanding and agreed with it.  Status is: Inpatient  Remains inpatient appropriate because:Inpatient level of care appropriate due to severity of illness  Dispo: The patient is from: Home              Anticipated d/c is to: Home              Patient currently is not medically stable to d/c.   Difficult to place patient No        Estimated body mass index is 24.84 kg/m as calculated from the following:   Height as of this encounter: 5\' 9"  (1.753 m).   Weight as of this encounter: 76.3 kg.     Nutritional Assessment: Body mass index is 24.84 kg/m.Marland Kitchen Seen by dietician.  I agree with the assessment and plan as outlined below: Nutrition Status:        .  Skin Assessment: I have examined the patient's skin and I agree with the wound assessment as performed by the wound care RN as outlined below:    Consultants:  None  Procedures:  Plan  Antimicrobials:  Anti-infectives (From admission, onward)    Start     Dose/Rate Route Frequency Ordered Stop   11/06/20 0000  azithromycin (ZITHROMAX) 500 mg in sodium chloride 0.9 % 250 mL IVPB       Note to Pharmacy: (In setting of acute copd exac)   500 mg 250 mL/hr over 60 Minutes Intravenous Every 24 hours 11/05/20 2311            Subjective: Seen and examined.  Still with some shortness of breath but better than yesterday.  Still coughing.  No other complaint.  Objective: Vitals:   11/06/20 0316 11/06/20 0806 11/06/20 0848 11/06/20 1025  BP: 98/66  131/78   Pulse:   89 86  Resp: 20  19   Temp: 97.9 F (36.6 C)  98 F (36.7 C)   TempSrc: Oral  Oral   SpO2:  93% 93%   Weight:      Height:        Intake/Output Summary (Last 24 hours) at 11/06/2020 1032 Last data filed at 11/06/2020 2979 Gross per 24 hour  Intake 250 ml  Output 550 ml  Net -300 ml   Filed Weights   11/05/20 1405  Weight: 76.3 kg    Examination:  General exam: Appears calm and comfortable   Respiratory system: Diminished breath sounds, no wheezes. Respiratory effort normal. Cardiovascular system: S1 & S2 heard, RRR. No JVD, murmurs, rubs, gallops or clicks. No pedal edema. Gastrointestinal system: Abdomen is nondistended, soft and nontender. No organomegaly or masses felt. Normal bowel sounds heard. Central nervous system: Alert and oriented. No focal neurological deficits. Extremities: Symmetric 5 x 5 power. Skin: No rashes, lesions or ulcers Psychiatry: Judgement and insight appear normal. Mood & affect appropriate.    Data Reviewed: I have personally reviewed following labs and imaging studies  CBC: Recent Labs  Lab 11/05/20 1440 11/06/20 0201  WBC 12.4* 12.8*  NEUTROABS 9.5* 11.6*  HGB 16.6 16.7  HCT 48.8 48.7  MCV 86.8 85.7  PLT 226 892   Basic Metabolic Panel: Recent Labs  Lab 11/05/20 1440 11/06/20 0201  NA 136 134*  K 3.1* 3.6  CL 98 98  CO2 29 25  GLUCOSE 155* 166*  BUN 8 8  CREATININE 1.11 1.11  CALCIUM 8.9 9.2  MG  --  2.0  2.0  PHOS  --  2.6   GFR: Estimated Creatinine Clearance: 62.8 mL/min (by C-G formula based on SCr of 1.11 mg/dL). Liver Function Tests: Recent Labs  Lab 11/06/20 0201  AST 25  ALT 18  ALKPHOS 108  BILITOT 1.1  PROT 7.0  ALBUMIN 3.7   No results for input(s): LIPASE, AMYLASE in the last 168 hours. No results for input(s): AMMONIA in the last 168 hours. Coagulation Profile: No results for input(s): INR, PROTIME in the last 168 hours. Cardiac Enzymes: No results for input(s): CKTOTAL, CKMB, CKMBINDEX, TROPONINI in the last 168 hours. BNP (last 3 results) No results for input(s): PROBNP in the last 8760 hours. HbA1C: No results for input(s): HGBA1C in the last 72 hours. CBG: No results for input(s): GLUCAP in the last 168 hours. Lipid Profile: No results for input(s): CHOL, HDL, LDLCALC, TRIG, CHOLHDL, LDLDIRECT in the last 72 hours. Thyroid Function Tests: No results for input(s): TSH, T4TOTAL, FREET4,  T3FREE, THYROIDAB in the last 72 hours. Anemia Panel: No results for input(s): VITAMINB12, FOLATE, FERRITIN, TIBC, IRON, RETICCTPCT in the last 72 hours. Sepsis Labs: Recent Labs  Lab 11/06/20 0201  PROCALCITON <0.10    Recent Results (from the past 240 hour(s))  Resp Panel by RT-PCR (Flu A&B, Covid) Nasopharyngeal Swab     Status: None   Collection Time: 11/05/20  2:56 PM   Specimen: Nasopharyngeal Swab; Nasopharyngeal(NP) swabs in vial transport medium  Result Value Ref Range Status   SARS Coronavirus 2 by RT PCR NEGATIVE NEGATIVE Final    Comment: (NOTE) SARS-CoV-2 target nucleic acids are NOT DETECTED.  The SARS-CoV-2 RNA is generally detectable in upper respiratory specimens during the acute phase of infection. The  lowest concentration of SARS-CoV-2 viral copies this assay can detect is 138 copies/mL. A negative result does not preclude SARS-Cov-2 infection and should not be used as the sole basis for treatment or other patient management decisions. A negative result may occur with  improper specimen collection/handling, submission of specimen other than nasopharyngeal swab, presence of viral mutation(s) within the areas targeted by this assay, and inadequate number of viral copies(<138 copies/mL). A negative result must be combined with clinical observations, patient history, and epidemiological information. The expected result is Negative.  Fact Sheet for Patients:  EntrepreneurPulse.com.au  Fact Sheet for Healthcare Providers:  IncredibleEmployment.be  This test is no t yet approved or cleared by the [place] FDA and  has been authorized for detection and/or diagnosis of SARS-CoV-2 by FDA under an Emergency Use Authorization (EUA). This EUA will remain  in effect (meaning this test can be used) for the duration of the COVID-19 declaration under Section 564(b)(1) of the Act, 21 U.S.C.section 360bbb-3(b)(1), unless the  authorization is terminated  or revoked sooner.       Influenza A by PCR NEGATIVE NEGATIVE Final   Influenza B by PCR NEGATIVE NEGATIVE Final    Comment: (NOTE) The Xpert Xpress SARS-CoV-2/FLU/RSV plus assay is intended as an aid in the diagnosis of influenza from Nasopharyngeal swab specimens and should not be used as a sole basis for treatment. Nasal washings and aspirates are unacceptable for Xpert Xpress SARS-CoV-2/FLU/RSV testing.  Fact Sheet for Patients: EntrepreneurPulse.com.au  Fact Sheet for Healthcare Providers: IncredibleEmployment.be  This test is not yet approved or cleared by the [place] FDA and has been authorized for detection and/or diagnosis of SARS-CoV-2 by FDA under an Emergency Use Authorization (EUA). This EUA will remain in effect (meaning this test can be used) for the duration of the COVID-19 declaration under Section 564(b)(1) of the Act, 21 U.S.C. section 360bbb-3(b)(1), unless the authorization is terminated or revoked.  Performed at [facility], [address] [place], [place] [address]       Radiology Studies: CT Angio Chest PE W/Cm &/Or Wo Cm  Result Date: 11/05/2020 CLINICAL DATA:  Cough, short of breath for 6 days, headache, history of non-small cell lung cancer EXAM: CT ANGIOGRAPHY CHEST WITH CONTRAST TECHNIQUE: Multidetector CT imaging of the chest was performed using the standard protocol during bolus administration of intravenous contrast. Multiplanar CT image reconstructions and MIPs were obtained to evaluate the vascular anatomy. CONTRAST:  38mL OMNIPAQUE IOHEXOL 350 MG/ML SOLN COMPARISON:  12/23/2019, 11/05/2020 FINDINGS: Cardiovascular: This is a technically adequate evaluation of the pulmonary vasculature. No filling defects or pulmonary emboli. The heart is unremarkable without pericardial effusion. No evidence of thoracic aortic aneurysm or dissection. Minimal atherosclerosis of the  aortic arch. Mediastinum/Nodes: No enlarged mediastinal, hilar, or axillary lymph nodes. Thyroid gland, trachea, and esophagus demonstrate no significant findings. Lungs/Pleura: Upper lobe predominant emphysema. No acute airspace disease, effusion, or pneumothorax. Postsurgical changes at the right lung base. Central airways are widely patent. Upper Abdomen: No acute abnormality. Musculoskeletal: No acute or destructive bony lesions. Reconstructed images demonstrate no additional findings. Review of the MIP images confirms the above findings. IMPRESSION: 1. No evidence of pulmonary embolus. 2. No acute intrathoracic process. 3. Aortic Atherosclerosis (ICD10-I70.0) and Emphysema (ICD10-J43.9). Electronically Signed   By: Randa Ngo M.D.   On: 11/05/2020 17:37   DG Chest Portable 1 View  Result Date: 11/05/2020 CLINICAL DATA:  Chest pain. EXAM: PORTABLE CHEST 1 VIEW COMPARISON:  Chest x-ray 05/23/2016.  CT chest 12/23/2019. FINDINGS: The cardiomediastinal silhouette is within normal limits. Again seen is some scarring in the right lung base. Surgical suture lines are again noted in the right mid lung. There is no new focal lung infiltrate, pleural effusion or pneumothorax. No acute fractures are seen. IMPRESSION: No active disease. Stable postoperative changes and scarring in the right lower lung. Electronically Signed   By: Ronney Asters M.D.   On: 11/05/2020 15:11    Scheduled Meds:  atorvastatin  40 mg Oral Daily   fluticasone  1 spray Each Nare Daily   ipratropium-albuterol  3 mL Nebulization Q6H   loratadine  10 mg Oral QPM   methylPREDNISolone (SOLU-MEDROL) injection  40 mg Intravenous BID   metoprolol tartrate  25 mg Oral BID   pantoprazole  40 mg Oral Daily   polyethylene glycol  17 g Oral Daily   Continuous Infusions:  azithromycin 500 mg (11/06/20 0027)     LOS: 1 day   Time spent: 30 minutes   Darliss Cheney, MD [facility]  11/06/2020, 10:32 AM  Please page via [place]  and do not message via secure chat for anything urgent. Secure chat can be used for anything non urgent.  How to contact the [facility] Attending or Consulting provider [place] or covering provider during after hours [place], for this patient?  Check the care team in [facility] and look for a) attending/consulting [facility] provider listed and b) the [facility] team listed. Page or secure chat [phone]. Log into [email or web address] and use [place]'s universal password to access. If you do not have the password, please contact the hospital operator. Locate the [facility] provider you are looking for under [facility] and page to a number that you can be directly reached. If you still have difficulty reaching the provider, please page the [facility] (Director on Call) for the Hospitalists listed on amion for assistance.

## 2020-11-06 NOTE — Plan of Care (Signed)

## 2020-11-06 NOTE — Plan of Care (Signed)

## 2020-11-07 LAB — BASIC METABOLIC PANEL
Anion gap: 9 (ref 5–15)
BUN: 18 mg/dL (ref 8–23)
CO2: 25 mmol/L (ref 22–32)
Calcium: 9.2 mg/dL (ref 8.9–10.3)
Chloride: 102 mmol/L (ref 98–111)
Creatinine, Ser: 1.22 mg/dL (ref 0.61–1.24)
GFR, Estimated: >60 mL/min (ref >60)
Glucose, Bld: 167 mg/dL — ABNORMAL HIGH (ref 70–99)
Potassium: 3.7 mmol/L (ref 3.5–5.1)
Sodium: 136 mmol/L (ref 135–145)

## 2020-11-07 MED ORDER — PREDNISONE 50 MG PO TABS: 50.0000 mg | ORAL_TABLET | Freq: Every day | ORAL | 0 refills | Status: AC

## 2020-11-07 MED ORDER — GUAIFENESIN-DM 100-10 MG/5ML PO SYRP
5.0000 mL | ORAL_SOLUTION | ORAL | Status: DC | PRN
  Administered 2020-11-07: 5 mL via ORAL
  Filled 2020-11-07: qty 5

## 2020-11-07 NOTE — Progress Notes (Signed)
SATURATION QUALIFICATIONS: (This note is used to comply with regulatory documentation for home oxygen)  Patient Saturations on Room Air at Rest = 99%  Patient Saturations on Room Air while Ambulating = 95%  Patient Saturations on 2  Liters of oxygen while Ambulating = 99%  Please briefly explain why patient needs home oxygen:  Patient has oxygen at home now that he uses prn.

## 2020-11-07 NOTE — Discharge Summary (Signed)
Physician Discharge Summary  Elijah Marshall LTJ:[id number] DOB: January 24, 1951 DOA: 11/05/2020  PCP: [name], MD  Admit date: 11/05/2020 Discharge date: 11/07/2020 30 Day Unplanned Readmission Risk Score    Flowsheet Row ED to Hosp-Admission (Current) from 11/05/2020 in [facility]  30 Day Unplanned Readmission Risk Score (%) 18.6 Filed at 11/07/2020 0801       This score is the patient's risk of an unplanned readmission within 30 days of being discharged (0 -100%). The score is based on dignosis, age, lab data, medications, orders, and past utilization.   Low:  0-14.9   Medium: 15-21.9   High: 22-29.9   Extreme: 30 and above          Admitted From: Home Disposition: Home  Recommendations for Outpatient Follow-up:  Follow up with PCP in 1-2 weeks Please obtain BMP/CBC in one week Please follow up with your PCP on the following pending results: Unresulted Labs (From admission, onward)    None         Home Health: None Equipment/Devices: None  Discharge Condition: Stable CODE STATUS: Full code Diet recommendation: Cardiac  Subjective: Seen and examined.  Feels much better.  No shortness of breath.  Ready to go home.  Brief/Interim Summary: Elijah Marshall is a 70 y.o. male with medical history significant for COPD, non-small cell lung cancer, hypertension, hyperlipidemia, allergic rhinitis, who is admitted to [facility] on 11/05/2020 by way of transfer from [place] emergency department with acute COPD exacerbation after presenting from home to the latter facility complaining of shortness of breath.  Upon arrival to ED, he was slightly tachypneic with oxygen saturation 83% on room air requiring 2 L of oxygen.  Mild hypokalemia.  Troponin x2 within normal range.  COVID and influenza negative. Chest x-ray showed no evidence of acute cardiopulmonary process  CTA chest showed no evidence of acute intrathoracic process, including no  evidence of acute pulmonary embolism.  He was given Solu-Medrol and DuoNeb in the ED and admitted under [place].  Steroids and antibiotics were continued.  Patient improved significantly.  Currently on room air and no shortness of breath and no wheezes.  He is stable enough for discharge today.  He is being sent home on 3 days of oral prednisone.  He tells me that his pulmonologist had sent a azithromycin prescription on Friday which he did not pick up and he will pick that up and use that as well.  Hypokalemia resolved.   Discharge Diagnoses:  Principal Problem:   Acute exacerbation of chronic obstructive pulmonary disease (COPD) ([place]) Active Problems:   GERD (gastroesophageal reflux disease)   Hyperlipidemia   Essential hypertension   Acute respiratory failure with hypoxia (HCC)   SOB (shortness of breath)   Hypokalemia   Allergic rhinitis    Discharge Instructions   Allergies as of 11/07/2020       Reactions   Aspirin Nausea And Vomiting   Morphine Itching   Morphine And Related Itching        Medication List     TAKE these medications    albuterol 108 (90 Base) MCG/ACT inhaler Commonly known as: VENTOLIN HFA Inhale 2 puffs into the lungs every 6 (six) hours as needed for wheezing or shortness of breath.   amitriptyline 25 MG tablet Commonly known as: ELAVIL TAKE 1 TABLET (25 MG TOTAL) BY MOUTH AT BEDTIME AS NEEDED FOR SLEEP.   atorvastatin 40 MG tablet Commonly known as: LIPITOR  TAKE 1 TABLET BY MOUTH EVERY DAY   azithromycin 250 MG tablet Commonly known as: Zithromax Take 2 tablets (500 mg) on  Day 1,  followed by 1 tablet (250 mg) once daily on Days 2 through 5.   baclofen 10 MG tablet Commonly known as: LIORESAL Take 10 mg by mouth daily as needed for muscle spasms.   Breztri Aerosphere 160-9-4.8 MCG/ACT Aero Generic drug: Budeson-Glycopyrrol-Formoterol INHALE 2 PUFFS INTO THE LUNGS 2 TIMES DAILY What changed: when to take this   Breztri Aerosphere  160-9-4.8 MCG/ACT Aero Generic drug: Budeson-Glycopyrrol-Formoterol Inhale 2 puffs into the lungs in the morning and at bedtime. What changed: Another medication with the same name was changed. Make sure you understand how and when to take each.   famotidine 40 MG tablet Commonly known as: PEPCID Take 1 tablet (40 mg total) by mouth 2 (two) times daily. What changed:  when to take this reasons to take this   hydrochlorothiazide 25 MG tablet Commonly known as: HYDRODIURIL Take 25 mg by mouth daily.   HYDROcodone-acetaminophen 5-325 MG tablet Commonly known as: NORCO/VICODIN Take 1-2 tablets by mouth every 4 (four) hours as needed for moderate pain or severe pain.   lansoprazole 30 MG capsule Commonly known as: PREVACID TAKE 1 CAPSULE (30 MG TOTAL) BY MOUTH 2 (TWO) TIMES DAILY. What changed: See the new instructions.   levocetirizine 5 MG tablet Commonly known as: XYZAL Take 5 mg by mouth every evening.   Lumify 0.025 % Soln Generic drug: Brimonidine Tartrate Apply 1 drop to eye daily.   metoprolol tartrate 25 MG tablet Commonly known as: LOPRESSOR Take 1 tablet (25 mg total) by mouth 2 (two) times daily.   MULTIVITAMIN ADULT PO Take by mouth.   nystatin 100000 UNIT/ML suspension Commonly known as: MYCOSTATIN Use as directed 5 mLs in the mouth or throat daily.   OXYGEN Inhale 2 L into the lungs daily as needed (with exertion only).   polyethylene glycol 17 g packet Commonly known as: MIRALAX / GLYCOLAX Take 17 g by mouth daily.   predniSONE 50 MG tablet Commonly known as: DELTASONE Take 1 tablet (50 mg total) by mouth daily with breakfast for 3 days. Start taking on: November 08, 2020   sildenafil 100 MG tablet Commonly known as: VIAGRA Take 100 mg by mouth daily as needed for erectile dysfunction.        Follow-up Information     [name], MD Follow up in 1 week(s).   Specialty: Family Medicine Contact information: [place]  [phone]                Allergies  Allergen Reactions   Aspirin Nausea And Vomiting   Morphine Itching   Morphine And Related Itching    Consultations: None   Procedures/Studies: CT Angio Chest PE W/Cm &/Or Wo Cm  Result Date: 11/05/2020 CLINICAL DATA:  Cough, short of breath for 6 days, headache, history of non-small cell lung cancer EXAM: CT ANGIOGRAPHY CHEST WITH CONTRAST TECHNIQUE: Multidetector CT imaging of the chest was performed using the standard protocol during bolus administration of intravenous contrast. Multiplanar CT image reconstructions and MIPs were obtained to evaluate the vascular anatomy. CONTRAST:  6mL OMNIPAQUE IOHEXOL 350 MG/ML SOLN COMPARISON:  12/23/2019, 11/05/2020 FINDINGS: Cardiovascular: This is a technically adequate evaluation of the pulmonary vasculature. No filling defects or pulmonary emboli. The heart is unremarkable without pericardial effusion. No evidence of thoracic aortic aneurysm or dissection. Minimal atherosclerosis of the aortic  arch. Mediastinum/Nodes: No enlarged mediastinal, hilar, or axillary lymph nodes. Thyroid gland, trachea, and esophagus demonstrate no significant findings. Lungs/Pleura: Upper lobe predominant emphysema. No acute airspace disease, effusion, or pneumothorax. Postsurgical changes at the right lung base. Central airways are widely patent. Upper Abdomen: No acute abnormality. Musculoskeletal: No acute or destructive bony lesions. Reconstructed images demonstrate no additional findings. Review of the MIP images confirms the above findings. IMPRESSION: 1. No evidence of pulmonary embolus. 2. No acute intrathoracic process. 3. Aortic Atherosclerosis (ICD10-I70.0) and Emphysema (ICD10-J43.9). Electronically Signed   By: Randa Ngo M.D.   On: 11/05/2020 17:37   DG Chest Portable 1 View  Result Date: 11/05/2020 CLINICAL DATA:  Chest pain. EXAM: PORTABLE CHEST 1 VIEW COMPARISON:  Chest x-ray 05/23/2016.  CT chest  12/23/2019. FINDINGS: The cardiomediastinal silhouette is within normal limits. Again seen is some scarring in the right lung base. Surgical suture lines are again noted in the right mid lung. There is no new focal lung infiltrate, pleural effusion or pneumothorax. No acute fractures are seen. IMPRESSION: No active disease. Stable postoperative changes and scarring in the right lower lung. Electronically Signed   By: Ronney Asters M.D.   On: 11/05/2020 15:11     Discharge Exam: Vitals:   11/07/20 0358 11/07/20 0742  BP: 101/65   Pulse:    Resp:    Temp: 98.2 F (36.8 C)   SpO2:  94%   Vitals:   11/06/20 1800 11/06/20 2003 11/07/20 0358 11/07/20 0742  BP: 130/75  101/65   Pulse: 88     Resp: 20     Temp: 97.9 F (36.6 C) 98.1 F (36.7 C) 98.2 F (36.8 C)   TempSrc: Axillary Oral Oral   SpO2: 92%   94%  Weight:   78.6 kg   Height:        General: Pt is alert, awake, not in acute distress Cardiovascular: RRR, S1/S2 +, no rubs, no gallops Respiratory: CTA bilaterally, no wheezing, no rhonchi Abdominal: Soft, NT, ND, bowel sounds + Extremities: no edema, no cyanosis    The results of significant diagnostics from this hospitalization (including imaging, microbiology, ancillary and laboratory) are listed below for reference.     Microbiology: Recent Results (from the past 240 hour(s))  Resp Panel by RT-PCR (Flu A&B, Covid) Nasopharyngeal Swab     Status: None   Collection Time: 11/05/20  2:56 PM   Specimen: Nasopharyngeal Swab; Nasopharyngeal(NP) swabs in vial transport medium  Result Value Ref Range Status   SARS Coronavirus 2 by RT PCR NEGATIVE NEGATIVE Final    Comment: (NOTE) SARS-CoV-2 target nucleic acids are NOT DETECTED.  The SARS-CoV-2 RNA is generally detectable in upper respiratory specimens during the acute phase of infection. The lowest concentration of SARS-CoV-2 viral copies this assay can detect is 138 copies/mL. A negative result does not preclude  SARS-Cov-2 infection and should not be used as the sole basis for treatment or other patient management decisions. A negative result may occur with  improper specimen collection/handling, submission of specimen other than nasopharyngeal swab, presence of viral mutation(s) within the areas targeted by this assay, and inadequate number of viral copies(<138 copies/mL). A negative result must be combined with clinical observations, patient history, and epidemiological information. The expected result is Negative.  Fact Sheet for Patients:  EntrepreneurPulse.com.au  Fact Sheet for Healthcare Providers:  IncredibleEmployment.be  This test is no t yet approved or cleared by the [place] FDA and  has been authorized for detection and/or diagnosis  of SARS-CoV-2 by FDA under an Emergency Use Authorization (EUA). This EUA will remain  in effect (meaning this test can be used) for the duration of the COVID-19 declaration under Section 564(b)(1) of the Act, 21 U.S.C.section 360bbb-3(b)(1), unless the authorization is terminated  or revoked sooner.       Influenza A by PCR NEGATIVE NEGATIVE Final   Influenza B by PCR NEGATIVE NEGATIVE Final    Comment: (NOTE) The Xpert Xpress SARS-CoV-2/FLU/RSV plus assay is intended as an aid in the diagnosis of influenza from Nasopharyngeal swab specimens and should not be used as a sole basis for treatment. Nasal washings and aspirates are unacceptable for Xpert Xpress SARS-CoV-2/FLU/RSV testing.  Fact Sheet for Patients: EntrepreneurPulse.com.au  Fact Sheet for Healthcare Providers: IncredibleEmployment.be  This test is not yet approved or cleared by the [place] FDA and has been authorized for detection and/or diagnosis of SARS-CoV-2 by FDA under an Emergency Use Authorization (EUA). This EUA will remain in effect (meaning this test can be used) for the duration of  the COVID-19 declaration under Section 564(b)(1) of the Act, 21 U.S.C. section 360bbb-3(b)(1), unless the authorization is terminated or revoked.  Performed at [facility], [address] [place], [place] [address]      Labs: BNP (last 3 results) Recent Labs    11/05/20 1440  BNP 92.1   Basic Metabolic Panel: Recent Labs  Lab 11/05/20 1440 11/06/20 0201 11/07/20 0058  NA 136 134* 136  K 3.1* 3.6 3.7  CL 98 98 102  CO2 29 25 25   GLUCOSE 155* 166* 167*  BUN 8 8 18   CREATININE 1.11 1.11 1.22  CALCIUM 8.9 9.2 9.2  MG  --  2.0  2.0  --   PHOS  --  2.6  --    Liver Function Tests: Recent Labs  Lab 11/06/20 0201  AST 25  ALT 18  ALKPHOS 108  BILITOT 1.1  PROT 7.0  ALBUMIN 3.7   No results for input(s): LIPASE, AMYLASE in the last 168 hours. No results for input(s): AMMONIA in the last 168 hours. CBC: Recent Labs  Lab 11/05/20 1440 11/06/20 0201  WBC 12.4* 12.8*  NEUTROABS 9.5* 11.6*  HGB 16.6 16.7  HCT 48.8 48.7  MCV 86.8 85.7  PLT 226 241   Cardiac Enzymes: No results for input(s): CKTOTAL, CKMB, CKMBINDEX, TROPONINI in the last 168 hours. BNP: Invalid input(s): POCBNP CBG: No results for input(s): GLUCAP in the last 168 hours. D-Dimer No results for input(s): DDIMER in the last 72 hours. Hgb A1c No results for input(s): HGBA1C in the last 72 hours. Lipid Profile No results for input(s): CHOL, HDL, LDLCALC, TRIG, CHOLHDL, LDLDIRECT in the last 72 hours. Thyroid function studies No results for input(s): TSH, T4TOTAL, T3FREE, THYROIDAB in the last 72 hours.  Invalid input(s): FREET3 Anemia work up No results for input(s): VITAMINB12, FOLATE, FERRITIN, TIBC, IRON, RETICCTPCT in the last 72 hours. Urinalysis    Component Value Date/Time   COLORURINE YELLOW 05/20/2015 1330   APPEARANCEUR CLEAR 05/20/2015 1330   LABSPEC 1.007 05/20/2015 1330   PHURINE 6.0 05/20/2015 1330   GLUCOSEU NEGATIVE 05/20/2015 1330   HGBUR NEGATIVE  05/20/2015 1330   BILIRUBINUR NEGATIVE 05/20/2015 1330   BILIRUBINUR negative 04/15/2015 0844   KETONESUR NEGATIVE 05/20/2015 1330   PROTEINUR NEGATIVE 05/20/2015 1330   UROBILINOGEN 0.2 04/15/2015 0844   UROBILINOGEN 0.2 07/18/2012 1330   NITRITE NEGATIVE 05/20/2015 1330   LEUKOCYTESUR NEGATIVE 05/20/2015 1330   Sepsis Labs  Invalid input(s): PROCALCITONIN,  WBC,  LACTICIDVEN Microbiology Recent Results (from the past 240 hour(s))  Resp Panel by RT-PCR (Flu A&B, Covid) Nasopharyngeal Swab     Status: None   Collection Time: 11/05/20  2:56 PM   Specimen: Nasopharyngeal Swab; Nasopharyngeal(NP) swabs in vial transport medium  Result Value Ref Range Status   SARS Coronavirus 2 by RT PCR NEGATIVE NEGATIVE Final    Comment: (NOTE) SARS-CoV-2 target nucleic acids are NOT DETECTED.  The SARS-CoV-2 RNA is generally detectable in upper respiratory specimens during the acute phase of infection. The lowest concentration of SARS-CoV-2 viral copies this assay can detect is 138 copies/mL. A negative result does not preclude SARS-Cov-2 infection and should not be used as the sole basis for treatment or other patient management decisions. A negative result may occur with  improper specimen collection/handling, submission of specimen other than nasopharyngeal swab, presence of viral mutation(s) within the areas targeted by this assay, and inadequate number of viral copies(<138 copies/mL). A negative result must be combined with clinical observations, patient history, and epidemiological information. The expected result is Negative.  Fact Sheet for Patients:  EntrepreneurPulse.com.au  Fact Sheet for Healthcare Providers:  IncredibleEmployment.be  This test is no t yet approved or cleared by the [place] FDA and  has been authorized for detection and/or diagnosis of SARS-CoV-2 by FDA under an Emergency Use Authorization (EUA). This EUA will remain  in  effect (meaning this test can be used) for the duration of the COVID-19 declaration under Section 564(b)(1) of the Act, 21 U.S.C.section 360bbb-3(b)(1), unless the authorization is terminated  or revoked sooner.       Influenza A by PCR NEGATIVE NEGATIVE Final   Influenza B by PCR NEGATIVE NEGATIVE Final    Comment: (NOTE) The Xpert Xpress SARS-CoV-2/FLU/RSV plus assay is intended as an aid in the diagnosis of influenza from Nasopharyngeal swab specimens and should not be used as a sole basis for treatment. Nasal washings and aspirates are unacceptable for Xpert Xpress SARS-CoV-2/FLU/RSV testing.  Fact Sheet for Patients: EntrepreneurPulse.com.au  Fact Sheet for Healthcare Providers: IncredibleEmployment.be  This test is not yet approved or cleared by the [place] FDA and has been authorized for detection and/or diagnosis of SARS-CoV-2 by FDA under an Emergency Use Authorization (EUA). This EUA will remain in effect (meaning this test can be used) for the duration of the COVID-19 declaration under Section 564(b)(1) of the Act, 21 U.S.C. section 360bbb-3(b)(1), unless the authorization is terminated or revoked.  Performed at [facility], [address] [place], [place] [address]      Time coordinating discharge: Over 30 minutes  SIGNED:   Darliss Cheney, MD  [facility] 11/07/2020, 10:14 AM  If 7PM-7AM, please contact night-coverage [email or web address]

## 2020-11-07 NOTE — Progress Notes (Signed)
Patient given dc instructions and verbalized understanding of medications and follow up appointments.  Patient reports he has an appointment with his primary care MD in am and he was advised to call the office early to see if they want him to keep that appointment or reschedule.  He verbalized understanding.  Patient taken out via [place].

## 2020-11-11 ENCOUNTER — Telehealth: Payer: Self-pay | Admitting: Internal Medicine

## 2020-11-11 NOTE — Telephone Encounter (Signed)
I have called the pt and he stated that he was in the hospital and was discharged.  He stated that he has been having some SHOB and cough with yellow sputum for the last 3 days but today the sputum is more white.  I have scheduled him to see MW on 10/7 at 11.  Pt is to call back if he cannot keep this appt.

## 2020-11-12 ENCOUNTER — Encounter: Payer: Self-pay | Admitting: Internal Medicine

## 2020-11-12 ENCOUNTER — Inpatient Hospital Stay: Payer: Medicare Other | Attending: Internal Medicine | Admitting: Dietician

## 2020-11-12 ENCOUNTER — Telehealth: Payer: Self-pay | Admitting: Dietician

## 2020-11-12 ENCOUNTER — Other Ambulatory Visit: Payer: Self-pay

## 2020-11-12 ENCOUNTER — Ambulatory Visit: Payer: Medicare Other | Admitting: Internal Medicine

## 2020-11-12 DIAGNOSIS — J449 Chronic obstructive pulmonary disease, unspecified: Secondary | ICD-10-CM | POA: Diagnosis not present

## 2020-11-12 DIAGNOSIS — J310 Chronic rhinitis: Secondary | ICD-10-CM

## 2020-11-12 DIAGNOSIS — J9611 Chronic respiratory failure with hypoxia: Secondary | ICD-10-CM

## 2020-11-12 MED ORDER — PREDNISONE 10 MG PO TABS: ORAL_TABLET | ORAL | 0 refills | Status: DC

## 2020-11-12 NOTE — Assessment & Plan Note (Signed)
Dx 2017 SATURATION QUALIFICATIONS:  11/19/2015  Patient Saturations on Room Air at Rest = 96% Patient Saturations on Room Air while Ambulating = 86% Patient Saturations on 2  Liters of oxygen while Ambulating = 96% - 05/16/2016 :   Saturations on Room Air at Rest = 88%--- increased to 99% on 2lpm continuous with activity   -  07/28/2019   Walked RA  2 laps @ approx 218ft each @ avg pace  stopped due to  sob with sats still 91%  - 09/28/2020   Walked on RA x  2  lap(s) =  approx 500 @ avg pace,  with lowest 02 sats 91% and no sob   Advised:  make sure you check your oxygen saturation  at your highest level of activity  to be sure it stays over 90% and adjust  02 flow upward to maintain this level if needed but remember to turn it back to previous settings when you stop (to conserve your supply).          Each maintenance medication was reviewed in detail including emphasizing most importantly the difference between maintenance and prns and under what circumstances the prns are to be triggered using an action plan format where appropriate.  Total time for H and P, chart review, counseling, reviewing how/ when to use hfa/02  device(s) and generating customized AVS unique to this post hosp f/u office visit / same day charting = 30 min

## 2020-11-12 NOTE — Progress Notes (Signed)
Subjective:   Patient ID: Elijah Marshall, male    DOB: 1950/06/03  MRN: [id number]   Brief patient profile:  70  yobm quit smoking 2013  and able work out at [employer] and did fine until hurt back 2011 then 2 back surgeries and knee surgery  and more noticeable doe  since then but also limited by back and s/p sup segmentectomy RLL NSC lung ca Ia 05/2015  referred to pulmonary clinic 09/05/2013 for ? Copd  With GOLD II criteria 10/29/13 and confirmed 01/10/2016     History of Present Illness  09/05/2013 1st [facility] office visit/ Elijah Marshall  Chief Complaint  Patient presents with   Pulmonary Consult    Referred per Dr. Norberto Sorenson.  Pt c/o SOB for the past 3 yrs. He states that he was dxed with COPD back in 2012 or 2013.  He states that that he sometimes has trouble with breathing when walking up stairs and lifting things.    rx spiriva first, alb, dulera not as effective  No problem at rest or supine Assoc nasal congestion much better p shot (?depomedrol) and bad again x sev years corresponds to worse doe  Ex = walking one mile slower pace than wife,trouble with hills, worse in heat  Bad hb even on dexilant  Min dry cough  rec Stop spiriva  Start anoro two puffs off one click each am Zantac 150 mg one at  Bedtime Prednisone 10 mg take  4 each am x 2 days,   2 each am x 2 days,  1 each am x 2 days and stop  GERD diet       09/28/2020  f/u ov/Elijah Marshall re: gold 3 copd maint on breztri and prn 02   Chief Complaint  Patient presents with   Follow-up    Patient has no concerns today.   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  Cough: none  Sleeping: ok flat  SABA use: p ex does not rechallenge or prechallenge as rec  02: only uses p ex/   2.5 lpm never checks sats Rec We will walk you today to see what your oxygen needs are > no desats RA Make sure you check your oxygen saturation  at your highest level of activity  to be sure it stays over 90%   No change in your  medications   11/12/2020  f/u ov/Elijah Marshall re: GOLD 3    maint on breztri with baseline saba = rarely   Chief Complaint  Patient presents with   Follow-up    SOB has slightly improved, coughing has worsened with phlegm   Dyspnea:  not walking much this week, flared 9/26 and ended up in ER 9/30 Cough: mucus is yellowish finished up zpak  11/10/20  Sleeping: kept up by cough, sense of throat congestion, flat bed with 3-4 pillows  SABA use: not using  02: only with ex  Covid status:   vax x 3    No obvious day to day or daytime variability or assoc excess/ purulent sputum or mucus plugs or hemoptysis or cp or chest tightness, subjective wheeze or overt sinus or hb symptoms.    Also denies any obvious fluctuation of symptoms with weather or environmental changes or other aggravating or alleviating factors except as outlined above   No unusual exposure hx or h/o childhood pna/ asthma or knowledge of premature birth.  Current Allergies, Complete Past Medical History, Past Surgical History, Family History, and Social  History were reviewed in [facility] electronic medical record.  ROS  The following are not active complaints unless bolded Hoarseness, sore throat, dysphagia, dental problems, itching, sneezing,  nasal congestion or discharge of excess mucus or purulent secretions, ear ache,   fever, chills, sweats, unintended wt loss or wt gain, classically pleuritic or exertional cp,  orthopnea pnd or arm/hand swelling  or leg swelling, presyncope, palpitations, abdominal pain, anorexia, nausea, vomiting, diarrhea  or change in bowel habits or change in bladder habits, change in stools or change in urine, dysuria, hematuria,  rash, arthralgias, visual complaints, headache, numbness, weakness or ataxia or problems with walking or coordination,  change in mood or  memory.        Current Meds  Medication Sig   albuterol (VENTOLIN HFA) 108 (90 Base) MCG/ACT inhaler Inhale 2 puffs into the lungs every  6 (six) hours as needed for wheezing or shortness of breath.   amitriptyline (ELAVIL) 25 MG tablet TAKE 1 TABLET (25 MG TOTAL) BY MOUTH AT BEDTIME AS NEEDED FOR SLEEP.   atorvastatin (LIPITOR) 40 MG tablet TAKE 1 TABLET BY MOUTH EVERY DAY (Patient taking differently: Take 40 mg by mouth daily.)   baclofen (LIORESAL) 10 MG tablet Take 10 mg by mouth daily as needed for muscle spasms.   BREZTRI AEROSPHERE 160-9-4.8 MCG/ACT AERO INHALE 2 PUFFS INTO THE LUNGS 2 TIMES DAILY (Patient taking differently: Inhale 2 puffs into the lungs in the morning and at bedtime.)   Brimonidine Tartrate (LUMIFY) 0.025 % SOLN Apply 1 drop to eye daily.   Budeson-Glycopyrrol-Formoterol (BREZTRI AEROSPHERE) 160-9-4.8 MCG/ACT AERO Inhale 2 puffs into the lungs in the morning and at bedtime.   famotidine (PEPCID) 40 MG tablet Take 1 tablet (40 mg total) by mouth 2 (two) times daily. (Patient taking differently: Take 40 mg by mouth daily as needed for heartburn.)   hydrochlorothiazide (HYDRODIURIL) 25 MG tablet Take 25 mg by mouth daily.   HYDROcodone-acetaminophen (NORCO/VICODIN) 5-325 MG tablet Take 1-2 tablets by mouth every 4 (four) hours as needed for moderate pain or severe pain.   lansoprazole (PREVACID) 30 MG capsule TAKE 1 CAPSULE (30 MG TOTAL) BY MOUTH 2 (TWO) TIMES DAILY. (Patient taking differently: Take 30 mg by mouth 2 (two) times daily before a meal.)   levocetirizine (XYZAL) 5 MG tablet Take 5 mg by mouth every evening.   metoprolol tartrate (LOPRESSOR) 25 MG tablet Take 1 tablet (25 mg total) by mouth 2 (two) times daily.   Multiple Vitamin (MULTIVITAMIN ADULT PO) Take by mouth.   nystatin (MYCOSTATIN) 100000 UNIT/ML suspension Use as directed 5 mLs in the mouth or throat daily.   OXYGEN Inhale 2 L into the lungs daily as needed (with exertion only).   polyethylene glycol (MIRALAX / GLYCOLAX) 17 g packet Take 17 g by mouth daily.   sildenafil (VIAGRA) 100 MG tablet Take 100 mg by mouth daily as needed for  erectile dysfunction.               Objective:   Physical Exam  11/12/2020        168  09/28/2020        168  07/28/2019        179   05/20/2019       178   12/17/2018      182  04/01/2018        182  12/28/2017      176  08/27/2017        176  06/22/2017  177  05/22/2017        183  04/19/2017        179      07/31/2016        182  05/16/2016        181  04/28/2016        185  01/10/2016        179    11/19/15 182 lb (82.6 kg)  11/11/15 180 lb (81.6 kg)  11/04/15 181 lb 3.5 oz (82.2 kg)      Vital signs reviewed  11/12/2020  - Note at rest 02 sats  94% on RA   General appearance:    anxious amb bm nad  HEENT : pt wearing mask not removed for exam due to covid -19 concerns.    NECK :  without JVD/Nodes/TM/ nl carotid upstrokes bilaterally   LUNGS: no acc muscle use,  Mod barrel  contour chest wall with bilateral  Distant bs s audible wheeze and  without cough on insp or exp maneuvers and mod  Hyperresonant  to  percussion bilaterally     CV:  RRR  no s3 or murmur or increase in P2, and no edema   ABD:  soft and nontender with pos mid insp Hoover's  in the supine position. No bruits or organomegaly appreciated, bowel sounds nl  MS:     ext warm without deformities, calf tenderness, cyanosis or clubbing No obvious joint restrictions   SKIN: warm and dry without lesions    NEURO:  alert, approp, nl sensorium with  no motor or cerebellar deficits apparent.          I personally reviewed images and agree with radiology impression as follows:   Chest CTa  11/05/20   1. No evidence of pulmonary embolus. 2. No acute intrathoracic process. 3. Aortic Atherosclerosis (ICD10-I70.0) and Emphysema (ICD10-J43.9).

## 2020-11-12 NOTE — Telephone Encounter (Signed)
Nutrition  Patient did not answer for scheduled nutrition follow-up visit. Left message with request for return call. Contact information provided.

## 2020-11-12 NOTE — Assessment & Plan Note (Signed)
Quit smoking 2013 - Spirometry  03/01/12  FEV1  1.63 - Spirometry   09/05/13 FEV1  1.34 (46%) ratio 44  - PFTs   10/29/2013    FEV1    1.70 (62%) ratio 50% no better after ssaba and DLCO 48%  - 09/05/2013  Walked RA x 3 laps @ fast pace @  185 ft each stopped due to end of study, fast pace,sat 89% at end  - 01/06/14 gradual worsening on anoro plus increase need for saba  - 01/28/2014   > try spiriva respimat/ symbicort 160 2bid > changed to symbicort 160 2bid only on 01/28/14  - 02/25/2014   90%   > changed to symbicort 80 2bid since hoarse on 160 and clear on exam    later.  - PFT's  01/10/2016  FEV1 1.38 (51 % ) ratio 47  p 1 % improvement from saba p symb 160  prior to study with DLCO  43/41  % corrects to 47  % for alv volume   - 02/21/2017  After extensive coaching inhaler device  effectiveness =    90% vs baseline < 25%  - 02/21/2017 changed to bevespi 2 bid  - 04/19/2017  Demonstrated elipta > try anoro to see if cost less/ works as well  - PFT's  05/22/2017  FEV1 1.21  (46 % ) ratio 46  p 1 % improvement from saba p anoro and albuterol 1 h prior to study with DLCO  38 % corrects to 49 % for alv volume   - changed to Incruse 06/04/17 due to tremors > resolved  - 04/01/2018    try change to bevespi 1-2 bid (one bid if too tremulous from 2)  - 10/21/2018 changed to anoro > globus sensation reported 12/17/2018  -12/17/2018   bevespi to see if throat symptoms improve  - 11/12/2020  After extensive coaching inhaler device,  effectiveness = 75%    (short ti)    Group D in terms of symptom/risk and laba/lama/ICS  therefore appropriate rx at this point >>>  breztri 2bid and Prednisone 10 mg take  4 each am x 2 days,   2 each am x 2 days,  1 each am x 2 days and stop / unable to push saba much harder due to tendency to tachycardia   Re SABA :  I spent extra time with pt today reviewing appropriate use of albuterol for prn use on exertion with the following points: 1) saba is for relief of sob that does not  improve by walking a slower pace or resting but rather if the pt does not improve after trying this first. 2) If the pt is convinced, as many are, that saba helps recover from activity faster then it's easy to tell if this is the case by re-challenging : ie stop, take the inhaler, then p 5 minutes try the exact same activity (intensity of workload) that just caused the symptoms and see if they are substantially diminished or not after saba 3) if there is an activity that reproducibly causes the symptoms, try the saba 15 min before the activity on alternate days   If in fact the saba really does help, then fine to continue to use it prn but advised may need to look closer at the maintenance regimen being used to achieve better control of airways disease with exertion.

## 2020-11-12 NOTE — Assessment & Plan Note (Signed)
Sinus ct 10/22/13  : Normal paranasal sinuses. - rechallenge with singulair 10/29/2013 > d/c 01/28/2014 as not better  - repeat sinus CT 04/08/15 > neg  - Allergy profile 05/16/2016 >  Eos 0. 1/  IgE  14 neg RAST  Assoc with Upper airway cough syndrome (previously labeled PNDS),  is so named because it's frequently impossible to sort out how much is  CR/sinusitis with freq throat clearing (which can be related to primary GERD)   vs  causing  secondary (" extra esophageal")  GERD from wide swings in gastric pressure that occur with throat clearing, often  promoting self use of mint and menthol lozenges that reduce the lower esophageal sphincter tone and exacerbate the problem further in a cyclical fashion.   These are the same pts (now being labeled as having "irritable larynx syndrome" by some cough centers) who not infrequently have a history of having failed to tolerate ace inhibitors,  dry powder inhalers or biphosphonates or report having atypical/extraesophageal reflux symptoms that don't respond to standard doses of PPI  and are easily confused as having aecopd or asthma flares by even experienced allergists/ pulmonologists (myself included).   Advised on max rx of gerd and approp use of nasal steroids -  see avs for instructions unique to this ov

## 2020-11-12 NOTE — Patient Instructions (Addendum)
Prevacid 30 mg Take 30- 60 min before your first and last meals of the day   For cough >  mucinex dm 1200 mg every hours  as needed   Flonase is twice daily each nostril (point to opposite ear)   Prednisone 10 mg take  4 each am x 2 days,   2 each am x 2 days,  1 each am x 2 days and stop   Plan A = Automatic = Always=   Breztri Take 2 puffs first thing in am and then another 2 puffs about 12 hours later.    Work on inhaler technique:  relax and gently blow all the way out then take a nice smooth full deep breath back in, triggering the inhaler at same time you start breathing in.  Hold for up to 5 seconds if you can. Blow out thru nose. Rinse and gargle with water when done.  If mouth or throat bother you at all,  try brushing teeth/gums/tongue with arm and hammer toothpaste/ make a slurry and gargle and spit out.     Plan B = Backup (to supplement plan A, not to replace it) Only use your albuterol inhaler as a rescue medication to be used if you can't catch your breath by resting or doing a relaxed purse lip breathing pattern.  - The less you use it, the better it will work when you need it. - Ok to use the inhaler up to 2 puffs  every 4 hours if you must but call for appointment if use goes up over your usual need - Don't leave home without it !!  (think of it like the spare tire for your car)   GERD (REFLUX)  is an extremely common cause of respiratory symptoms just like yours , many times with no obvious heartburn at all.    It can be treated with medication, but also with lifestyle changes including elevation of the head of your bed (ideally with 6 -8inch blocks under the headboard of your bed),  Smoking cessation, avoidance of late meals, excessive alcohol, and avoid fatty foods, chocolate, peppermint, colas, red wine, and acidic juices such as orange juice.  NO MINT OR MENTHOL PRODUCTS SO NO COUGH DROPS  USE SUGARLESS CANDY INSTEAD (Jolley ranchers or Stover's or Life Savers) or even  ice chips will also do - the key is to swallow to prevent all throat clearing. NO OIL BASED VITAMINS - use powdered substitutes.  Avoid fish oil when coughing.   Keep previous appt > call sooner if needed

## 2020-12-08 ENCOUNTER — Telehealth: Payer: Self-pay | Admitting: Internal Medicine

## 2020-12-08 MED ORDER — AZITHROMYCIN 250 MG PO TABS: ORAL_TABLET | ORAL | 0 refills | Status: DC

## 2020-12-08 MED ORDER — PREDNISONE 10 MG PO TABS: ORAL_TABLET | ORAL | 0 refills | Status: AC

## 2020-12-08 NOTE — Telephone Encounter (Signed)
Called and spoke with pt letting him know recs per MW and he verbalized understanding. Meds have been sent to preferred pharmacy for pt. Nothing further needed.

## 2020-12-08 NOTE — Telephone Encounter (Signed)
Call returned to patient, confirmed DOB. Patient reports developing a cough with white mucous, increased SOB, and increased sneezing. He confirms he is using Breztri daily and albuterol prn. He is taking xyzal for his allergies. Denies fever. He reports a headache. He reports he coughed for several hours last night.He has been taking mucinex daily and has not used the nasal spray. He has not tried anything OTC. He reports when he last saw Dr Melvyn Novas he was given prednisone and a zpack and he thinks he may need another one. Denies sweats, chills, or body aches.   MW please advise. Thanks :)

## 2020-12-08 NOTE — Telephone Encounter (Signed)
Zpak/ Prednisone 10 mg take  4 each am x 2 days,   2 each am x 2 days,  1 each am x 2 days and stop  

## 2020-12-14 ENCOUNTER — Telehealth: Payer: Self-pay | Admitting: Internal Medicine

## 2020-12-14 NOTE — Telephone Encounter (Signed)
I still have openings Friday 11/11 so see if he'll come in then

## 2020-12-14 NOTE — Telephone Encounter (Signed)
Appt has been scheduled for the pt on Friday to see MW.  Pt is aware to call back if he feels that he gets worse.

## 2020-12-14 NOTE — Telephone Encounter (Signed)
Called and spoke with pt and he stated that he was given zpak and pred last week. He is some better this week but still having a cough with yellow sputum and nasal congestion.  He is wanting to see what MW wants to do now.  Please advise. Thanks

## 2020-12-17 ENCOUNTER — Encounter: Payer: Self-pay | Admitting: Internal Medicine

## 2020-12-17 ENCOUNTER — Other Ambulatory Visit: Payer: Self-pay

## 2020-12-17 ENCOUNTER — Ambulatory Visit: Payer: Medicare Other | Admitting: Internal Medicine

## 2020-12-17 ENCOUNTER — Other Ambulatory Visit: Payer: Self-pay | Admitting: Internal Medicine

## 2020-12-17 VITALS — BP 118/78 | HR 89 | Temp 98.1°F | Ht 69.0 in | Wt 167.0 lb

## 2020-12-17 DIAGNOSIS — J449 Chronic obstructive pulmonary disease, unspecified: Secondary | ICD-10-CM | POA: Diagnosis not present

## 2020-12-17 DIAGNOSIS — J310 Chronic rhinitis: Secondary | ICD-10-CM | POA: Diagnosis not present

## 2020-12-17 DIAGNOSIS — J9611 Chronic respiratory failure with hypoxia: Secondary | ICD-10-CM | POA: Diagnosis not present

## 2020-12-17 MED ORDER — STIOLTO RESPIMAT 2.5-2.5 MCG/ACT IN AERS: 2.0000 | INHALATION_SPRAY | Freq: Every day | RESPIRATORY_TRACT | 0 refills | Status: DC

## 2020-12-17 MED ORDER — PREDNISONE 10 MG PO TABS: ORAL_TABLET | ORAL | 0 refills | Status: DC

## 2020-12-17 NOTE — Progress Notes (Signed)
Subjective:   Patient ID: Elijah Marshall, male    DOB: [birth year]  MRN: [id number]   Brief patient profile:  70  yobm quit smoking 2013  and able work out at [employer] and did fine until hurt back 2011 then 2 back surgeries and knee surgery  and more noticeable doe  since then but also limited by back and s/p sup segmentectomy RLL NSC lung ca Ia 05/2015  referred to pulmonary clinic 09/05/2013 for ? Copd  With GOLD II criteria 10/29/13 and confirmed 01/10/2016     History of Present Illness  09/05/2013 1st [facility] office visit/ Chonte Ricke  Chief Complaint  Patient presents with   Pulmonary Consult    Referred per Dr. Norberto Sorenson.  Pt c/o SOB for the past 3 yrs. He states that he was dxed with COPD back in 2012 or 2013.  He states that that he sometimes has trouble with breathing when walking up stairs and lifting things.    rx spiriva first, alb, dulera not as effective  No problem at rest or supine Assoc nasal congestion much better p shot (?depomedrol) and bad again x sev years corresponds to worse doe  Ex = walking one mile slower pace than wife,trouble with hills, worse in heat  Bad hb even on dexilant  Min dry cough  rec Stop spiriva  Start anoro two puffs off one click each am Zantac 150 mg one at  Bedtime Prednisone 10 mg take  4 each am x 2 days,   2 each am x 2 days,  1 each am x 2 days and stop  GERD diet     11/12/2020  f/u ov/Fidel Caggiano re: GOLD 3    maint on breztri with baseline saba = rarely   Chief Complaint  Patient presents with   Follow-up    SOB has slightly improved, coughing has worsened with phlegm   Dyspnea:  not walking much this week, flared 9/26 and ended up in ER 9/30 Cough: mucus is yellowish finished up zpak  11/10/20  Sleeping: kept up by cough, sense of throat congestion, flat bed with 3-4 pillows  SABA use: not using  02: only with ex  Covid status:   vax x 3  Rec Prevacid 30 mg Take 30- 60 min before your first and last meals of the day  For cough >   mucinex dm 1200 mg every 12 hours  as needed  Flonase is twice daily each nostril (point to opposite ear)  Prednisone 10 mg take  4 each am x 2 days,   2 each am x 2 days,  1 each am x 2 days and stop  Plan A = Automatic = Always=   Breztri Take 2 puffs first thing in am and then another 2 puffs about 12 hours later.  Work on inhaler technique:  Plan B = Backup (to supplement plan A, not to replace it) Only use your albuterol inhaler as a rescue medication  GERD diet   12/08/20 called in pred x 6 days/ zpak   12/17/2020  Acute ov/Charleston Hankin re: GOLD 3 "copd flare" dates all the way to Sept 2022   maint on breztri 2bid /  not taking ppi ac  Chief Complaint  Patient presents with   Follow-up    Pt states f/u for cough he have seen some improvement this morning  Dyspnea:  ? Some better / doing [employer] shopping ok pushing cart  Cough: severe almost to point of  choking despite pred/ zpak Sleeping: bed is flat / 3 pillows worse first thing in am  SABA use: a few times since d/c from hospital / no neb  02: 2.5 lpm prn  Covid status:   covd x 2 / never infected    No obvious day to day or daytime variability or assoc excess/ purulent sputum or mucus plugs or hemoptysis or cp or chest tightness, subjective wheeze or overt sinus or hb symptoms.    Also denies any obvious fluctuation of symptoms with weather or environmental changes or other aggravating or alleviating factors except as outlined above   No unusual exposure hx or h/o childhood pna/ asthma or knowledge of premature birth.  Current Allergies, Complete Past Medical History, Past Surgical History, Family History, and Social History were reviewed in [facility] record.  ROS  The following are not active complaints unless bolded Hoarseness, sore throat= globus, dysphagia, dental problems, itching, sneezing,  nasal congestion or discharge of excess mucus or purulent secretions, ear ache,   fever, chills, sweats,  unintended wt loss or wt gain, classically pleuritic or exertional cp,  orthopnea pnd or arm/hand swelling  or leg swelling, presyncope, palpitations, abdominal pain, anorexia, nausea, vomiting, diarrhea  or change in bowel habits or change in bladder habits, change in stools or change in urine, dysuria, hematuria,  rash, arthralgias, visual complaints, headache, numbness, weakness or ataxia or problems with walking or coordination,  change in mood or  memory.        Current Meds  Medication Sig   albuterol (VENTOLIN HFA) 108 (90 Base) MCG/ACT inhaler Inhale 2 puffs into the lungs every 6 (six) hours as needed for wheezing or shortness of breath.   amitriptyline (ELAVIL) 25 MG tablet TAKE 1 TABLET (25 MG TOTAL) BY MOUTH AT BEDTIME AS NEEDED FOR SLEEP.   atorvastatin (LIPITOR) 40 MG tablet TAKE 1 TABLET BY MOUTH EVERY DAY (Patient taking differently: Take 40 mg by mouth daily.)   azithromycin (ZITHROMAX) 250 MG tablet Take two today and then one daily until finished.   baclofen (LIORESAL) 10 MG tablet Take 10 mg by mouth daily as needed for muscle spasms.   BREZTRI AEROSPHERE 160-9-4.8 MCG/ACT AERO INHALE 2 PUFFS INTO THE LUNGS 2 TIMES DAILY (Patient taking differently: Inhale 2 puffs into the lungs in the morning and at bedtime.)   Brimonidine Tartrate (LUMIFY) 0.025 % SOLN Apply 1 drop to eye daily.   Budeson-Glycopyrrol-Formoterol (BREZTRI AEROSPHERE) 160-9-4.8 MCG/ACT AERO Inhale 2 puffs into the lungs in the morning and at bedtime.   famotidine (PEPCID) 40 MG tablet Take 1 tablet (40 mg total) by mouth 2 (two) times daily. (Patient taking differently: Take 40 mg by mouth daily as needed for heartburn.)   hydrochlorothiazide (HYDRODIURIL) 25 MG tablet Take 25 mg by mouth daily.   HYDROcodone-acetaminophen (NORCO/VICODIN) 5-325 MG tablet Take 1-2 tablets by mouth every 4 (four) hours as needed for moderate pain or severe pain.   lansoprazole (PREVACID) 30 MG capsule TAKE 1 CAPSULE (30 MG TOTAL) BY  MOUTH 2 (TWO) TIMES DAILY. (Patient taking differently: Take 30 mg by mouth 2 (two) times daily before a meal.)   levocetirizine (XYZAL) 5 MG tablet Take 5 mg by mouth every evening.   metoprolol tartrate (LOPRESSOR) 25 MG tablet Take 1 tablet (25 mg total) by mouth 2 (two) times daily.   Multiple Vitamin (MULTIVITAMIN ADULT PO) Take by mouth.   nystatin (MYCOSTATIN) 100000 UNIT/ML suspension Use as directed 5 mLs in the mouth  or throat daily.   OXYGEN Inhale 2 L into the lungs daily as needed (with exertion only).   polyethylene glycol (MIRALAX / GLYCOLAX) 17 g packet Take 17 g by mouth daily.   sildenafil (VIAGRA) 100 MG tablet Take 100 mg by mouth daily as needed for erectile dysfunction.               Objective:  Physical Exam  12/17/2020      167  11/12/2020        168  09/28/2020        168  07/28/2019        179   05/20/2019       178   12/17/2018      182  04/01/2018        182  12/28/2017      176  08/27/2017        176  06/22/2017        177  05/22/2017        183  04/19/2017        179      07/31/2016        182  05/16/2016        181  04/28/2016        185  01/10/2016        179    11/19/15 182 lb (82.6 kg)  11/11/15 180 lb (81.6 kg)  11/04/15 181 lb 3.5 oz (82.2 kg)      Vital signs reviewed  12/17/2020  - Note at rest 02 sats  93% on RA   General appearance:    amb hoarse bm snorting, sniffling throat clearing s production with pseudowheeze resolves with plm   HEENT : pt wearing mask not removed for exam due to covid -19 concerns.    NECK :  without JVD/Nodes/TM/ nl carotid upstrokes bilaterally   LUNGS: no acc muscle use,  Mod barrel  contour chest wall with bilateral  Distant bs s audible wheeze and  without cough on insp or exp maneuvers and mod  Hyperresonant  to  percussion bilaterally     CV:  RRR  no s3 or murmur or increase in P2, and no edema   ABD:  soft and nontender with pos mid insp Hoover's  in the supine position. No bruits or organomegaly  appreciated, bowel sounds nl  MS:     ext warm without deformities, calf tenderness, cyanosis or clubbing No obvious joint restrictions   SKIN: warm and dry without lesions    NEURO:  alert, approp, nl sensorium with  no motor or cerebellar deficits apparent.          I personally reviewed images and agree with radiology impression as follows:   Chest CT 11/05/20  1. No evidence of pulmonary embolus. 2. No acute intrathoracic process. 3. Aortic Atherosclerosis (ICD10-I70.0) and Emphysema (ICD10-J43.9).

## 2020-12-17 NOTE — Patient Instructions (Addendum)
Flonase ( nasal steroids) have no immediate benefit in terms of improving symptoms.  To help them reached the target tissue, the patient should use Afrin two puffs every 12 hours applied one min before using the nasal steroids.  Afrin should be stopped after no more than 5 days.  If the symptoms worsen, Afrin can be restarted after 5 days off of therapy to prevent rebound congestion from overuse of Afrin.  I also emphasized that in no way are nasal steroids a concern in terms of "addiction".   Stop breztri   Start stiolto 2 puffs each  and prednisone 10 mg 2 each am until better then 1 daily for 5 days and stop   For cough mucinex dm  every 12 hours and use the flutter valve as much as possible   We will call you to set up sinus CT   Be sure  Prevacid is 30 mg Take 30- 60 min before your first and last meals of the day and take pepcid 20 mg after supper and bedtime   GERD (REFLUX)  is an extremely common cause of respiratory symptoms just like yours , many times with no obvious heartburn at all.    It can be treated with medication, but also with lifestyle changes including elevation of the head of your bed (ideally with 6 -8inch blocks under the headboard of your bed),  Smoking cessation, avoidance of late meals, excessive alcohol, and avoid fatty foods, chocolate, peppermint, colas, red wine, and acidic juices such as orange juice.  NO MINT OR MENTHOL PRODUCTS SO NO COUGH DROPS  USE SUGARLESS CANDY INSTEAD (Jolley ranchers or Stover's or Life Savers) or even ice chips will also do - the key is to swallow to prevent all throat clearing. NO OIL BASED VITAMINS - use powdered substitutes.  Avoid fish oil when coughing.   Please schedule a follow up office visit in 4 weeks, sooner if needed

## 2020-12-18 ENCOUNTER — Encounter: Payer: Self-pay | Admitting: Internal Medicine

## 2020-12-18 NOTE — Assessment & Plan Note (Signed)
Dx 2017 SATURATION QUALIFICATIONS:  11/19/2015  Patient Saturations on Room Air at Rest = 96% Patient Saturations on Room Air while Ambulating = 86% Patient Saturations on 2  Liters of oxygen while Ambulating = 96% - 05/16/2016 :   Saturations on Room Air at Rest = 88%--- increased to 99% on 2lpm continuous with activity   -  07/28/2019   Walked RA  2 laps @ approx 273ft each @ avg pace  stopped due to  sob with sats still 91%  - 09/28/2020   Walked on RA x  2  lap(s) =  approx 500 @ avg pace,  with lowest 02 sats 91% and no sob   >>> continue prn 02           Each maintenance medication was reviewed in detail including emphasizing most importantly the difference between maintenance and prns and under what circumstances the prns are to be triggered using an action plan format where appropriate.  Total time for H and P, chart review, counseling, reviewing smi/ flutter device(s) and generating customized AVS unique to this office visit / same day charting  > 30 min

## 2020-12-18 NOTE — Assessment & Plan Note (Signed)
Sinus ct 10/22/13  : Normal paranasal sinuses. - rechallenge with singulair 10/29/2013 > d/c 01/28/2014 as not better  - repeat sinus CT 04/08/15 > neg  - Allergy profile 05/16/2016 >  Eos 0. 1/  IgE  14 neg RAST - repeat sinus CT ordered   rec continue flonase plus afrin x 5 d only, continue xyzal

## 2020-12-18 NOTE — Assessment & Plan Note (Addendum)
Quit smoking 2013 - Spirometry  03/01/12  FEV1  1.63 - Spirometry   09/05/13 FEV1  1.34 (46%) ratio 44  - PFTs   10/29/2013    FEV1    1.70 (62%) ratio 50% no better after ssaba and DLCO 48%  - 09/05/2013  Walked RA x 3 laps @ fast pace @  185 ft each stopped due to end of study, fast pace,sat 89% at end  - 01/06/14 gradual worsening on anoro plus increase need for saba  - 01/28/2014   > try spiriva respimat/ symbicort 160 2bid > changed to symbicort 160 2bid only on 01/28/14  - 02/25/2014   90%   > changed to symbicort 80 2bid since hoarse on 160 and clear on exam    later.  - PFT's  01/10/2016  FEV1 1.38 (51 % ) ratio 47  p 1 % improvement from saba p symb 160  prior to study with DLCO  43/41  % corrects to 47  % for alv volume   - 02/21/2017  After extensive coaching inhaler device  effectiveness =    90% vs baseline < 25%  - 02/21/2017 changed to bevespi 2 bid  - 04/19/2017  Demonstrated elipta > try anoro to see if cost less/ works as well  - PFT's  05/22/2017  FEV1 1.21  (46 % ) ratio 46  p 1 % improvement from saba p anoro and albuterol 1 h prior to study with DLCO  38 % corrects to 49 % for alv volume   - changed to Incruse 06/04/17 due to tremors > resolved  - 04/01/2018    try change to bevespi 1-2 bid (one bid if too tremulous from 2)  - 10/21/2018 changed to anoro > globus sensation reported 12/17/2018  - 12/17/2020  After extensive coaching inhaler device,  effectiveness =    75% with SMI > changed to stiolto due to cough on breztri  Plus prednisone prn  - 12/17/2020 added pred as plan D = 20 mg per day until better then 10 mg per day x 5 d and stop - 12/17/2020 added flutter valve     DDX of  difficult airways management almost all start with A and  include Adherence, Ace Inhibitors, Acid Reflux, Active Sinus Disease, Alpha 1 Antitripsin deficiency, Anxiety masquerading as Airways dz,  ABPA,  Allergy(esp in young), Aspiration (esp in elderly), Adverse effects of meds,  Active smoking or  vaping, A bunch of PE's (a small clot burden can't cause this syndrome unless there is already severe underlying pulm or vascular dz with poor reserve) plus two Bs  = Bronchiectasis and Beta blocker use..and one C= CHF  Adherence is always the initial "prime suspect" and is a multilayered concern that requires a "trust but verify" approach in every patient - starting with knowing how to use medications, especially inhalers, correctly, keeping up with refills and understanding the fundamental difference between maintenance and prns vs those medications only taken for a very short course and then stopped and not refilled.  - see hfa teaching   ? Acid (or non-acid) GERD > always difficult to exclude as up to 75% of pts in some series report no assoc GI/ Heartburn symptoms> rec continue max (24h)  acid suppression and diet restrictions/ reviewed     ? Allergy component > pred as plan D  ? Active sinus dz > sinus CT next rather than just keep using abx here   ? Anxiety/depression  > usually at  the bottom of this list of usual suspects but should be much higher on this pt's based on H and P and note already on psychotropics and may interfere with adherence and also interpretation of response or lack thereof to symptom management which can be quite subjective >>> consider adding gabapentin if can't stop the non-stop throat clearing that is serving to aggravate his upper airways inflammation   ? Bronchiectasis > not present on CT

## 2020-12-20 ENCOUNTER — Ambulatory Visit (HOSPITAL_COMMUNITY)
Admission: RE | Admit: 2020-12-20 | Discharge: 2020-12-20 | Disposition: A | Discharge status: Home / Self Care | Payer: Medicare Other | Source: Ambulatory Visit | Attending: Internal Medicine | Admitting: Internal Medicine

## 2020-12-20 ENCOUNTER — Inpatient Hospital Stay: Payer: Medicare Other | Attending: Internal Medicine

## 2020-12-20 ENCOUNTER — Other Ambulatory Visit: Payer: Self-pay

## 2020-12-20 DIAGNOSIS — C3490 Malignant neoplasm of unspecified part of unspecified bronchus or lung: Secondary | ICD-10-CM

## 2020-12-20 DIAGNOSIS — Z85118 Personal history of other malignant neoplasm of bronchus and lung: Secondary | ICD-10-CM | POA: Insufficient documentation

## 2020-12-20 LAB — CMP (CANCER CENTER ONLY)
ALT: 21 U/L (ref 0–44)
AST: 22 U/L (ref 15–41)
Albumin: 4.1 g/dL (ref 3.5–5.0)
Alkaline Phosphatase: 112 U/L (ref 38–126)
Anion gap: 12 (ref 5–15)
BUN: 11 mg/dL (ref 8–23)
CO2: 24 mmol/L (ref 22–32)
Calcium: 9.6 mg/dL (ref 8.9–10.3)
Chloride: 102 mmol/L (ref 98–111)
Creatinine: 1.07 mg/dL (ref 0.61–1.24)
GFR, Estimated: >60 mL/min (ref >60)
Glucose, Bld: 120 mg/dL — ABNORMAL HIGH (ref 70–99)
Potassium: 3.3 mmol/L — ABNORMAL LOW (ref 3.5–5.1)
Sodium: 138 mmol/L (ref 135–145)
Total Bilirubin: 0.8 mg/dL (ref 0.3–1.2)
Total Protein: 7.6 g/dL (ref 6.5–8.1)

## 2020-12-20 LAB — CBC WITH DIFFERENTIAL (CANCER CENTER ONLY)
Abs Immature Granulocytes: 0.06 10*3/uL (ref 0.00–0.07)
Basophils Absolute: 0 10*3/uL (ref 0.0–0.1)
Basophils Relative: 0 %
Eosinophils Absolute: 0 10*3/uL (ref 0.0–0.5)
Eosinophils Relative: 0 %
HCT: 47.4 % (ref 39.0–52.0)
Hemoglobin: 16.3 g/dL (ref 13.0–17.0)
Immature Granulocytes: 0 %
Lymphocytes Relative: 8 %
Lymphs Abs: 1.1 10*3/uL (ref 0.7–4.0)
MCH: 29.2 pg (ref 26.0–34.0)
MCHC: 34.4 g/dL (ref 30.0–36.0)
MCV: 84.8 fL (ref 80.0–100.0)
Monocytes Absolute: 0.2 10*3/uL (ref 0.1–1.0)
Monocytes Relative: 1 %
Neutro Abs: 12.3 10*3/uL — ABNORMAL HIGH (ref 1.7–7.7)
Neutrophils Relative %: 91 %
Platelet Count: 271 10*3/uL (ref 150–400)
RBC: 5.59 MIL/uL (ref 4.22–5.81)
RDW: 12.8 % (ref 11.5–15.5)
WBC Count: 13.7 10*3/uL — ABNORMAL HIGH (ref 4.0–10.5)
nRBC: 0 % (ref 0.0–0.2)

## 2020-12-20 MED ORDER — IOHEXOL 350 MG/ML SOLN
60.0000 mL | Freq: Once | INTRAVENOUS | Status: AC | PRN
  Administered 2020-12-20: 60 mL via INTRAVENOUS

## 2020-12-22 ENCOUNTER — Inpatient Hospital Stay: Payer: Medicare Other | Admitting: Internal Medicine

## 2020-12-22 ENCOUNTER — Other Ambulatory Visit: Payer: Self-pay

## 2020-12-22 ENCOUNTER — Encounter: Payer: Self-pay | Admitting: Internal Medicine

## 2020-12-22 VITALS — BP 143/91 | HR 85 | Temp 97.5°F | Resp 20 | Ht 69.0 in | Wt 166.5 lb

## 2020-12-22 DIAGNOSIS — C3490 Malignant neoplasm of unspecified part of unspecified bronchus or lung: Secondary | ICD-10-CM

## 2020-12-22 DIAGNOSIS — Z85118 Personal history of other malignant neoplasm of bronchus and lung: Secondary | ICD-10-CM | POA: Diagnosis present

## 2020-12-22 NOTE — Progress Notes (Signed)
[facility] [phone]   [phone]  OFFICE PROGRESS NOTE  [name], MD [place] [address]  DIAGNOSIS: Stage IA (T1a, N0, M0) non-small cell lung cancer, moderately differentiated squamous cell carcinoma presented with right lower lobe lung nodule.   PRIOR THERAPY: Status post wedge resection of the right lower lobe in April 2017 under the care of Dr. Roxan Hockey.  CURRENT THERAPY: Observation.  INTERVAL HISTORY: Elijah Marshall 70 y.o. male returns to the clinic today for annual follow-up visit.  The patient is feeling fine today with no concerning complaints.  He was hospitalized several weeks ago with COPD exacerbation.  He denied having any current chest pain but has shortness of breath with exertion with no cough or hemoptysis.  He denied having any fever or chills.  He has no nausea, vomiting, diarrhea or constipation.  He has no headache or visual changes.  He is here today for evaluation with repeat CT scan of the chest for restaging of his disease.  MEDICAL HISTORY: Past Medical History:  Diagnosis Date   Allergy    Arthritis    BPH with obstruction/lower urinary tract symptoms 2016   Nocturia   Cancer ([place])    unsure at this time   Cancer of lower lobe of right lung ([place])    COPD (chronic obstructive pulmonary disease) ([place])    per patient's health survey - he put a ?   Depression    Dysrhythmia    Erectile dysfunction due to arterial insufficiency    GERD (gastroesophageal reflux disease)    Hyperlipidemia    Hypertension    Non-small cell carcinoma of lung, right ([place]) 06/24/2015   Pneumonia    Shortness of breath    Ulcer     ALLERGIES:  is allergic to aspirin, morphine, and morphine and related.  MEDICATIONS:  Current Outpatient Medications  Medication Sig Dispense Refill   albuterol (VENTOLIN HFA) 108 (90 Base) MCG/ACT inhaler Inhale 2 puffs into the lungs every 6 (six) hours as needed for  wheezing or shortness of breath.     amitriptyline (ELAVIL) 25 MG tablet TAKE 1 TABLET (25 MG TOTAL) BY MOUTH AT BEDTIME AS NEEDED FOR SLEEP. 90 tablet 0   atorvastatin (LIPITOR) 40 MG tablet TAKE 1 TABLET BY MOUTH EVERY DAY (Patient taking differently: Take 40 mg by mouth daily.) 90 tablet 0   baclofen (LIORESAL) 10 MG tablet Take 10 mg by mouth daily as needed for muscle spasms.     Brimonidine Tartrate (LUMIFY) 0.025 % SOLN Apply 1 drop to eye daily.     famotidine (PEPCID) 40 MG tablet Take 1 tablet (40 mg total) by mouth 2 (two) times daily. (Patient taking differently: Take 40 mg by mouth daily as needed for heartburn.) 180 tablet 3   hydrochlorothiazide (HYDRODIURIL) 25 MG tablet Take 25 mg by mouth daily.     HYDROcodone-acetaminophen (NORCO/VICODIN) 5-325 MG tablet Take 1-2 tablets by mouth every 4 (four) hours as needed for moderate pain or severe pain. 40 tablet 0   lansoprazole (PREVACID) 30 MG capsule TAKE 1 CAPSULE (30 MG TOTAL) BY MOUTH 2 (TWO) TIMES DAILY. (Patient taking differently: Take 30 mg by mouth 2 (two) times daily before a meal.) 180 capsule 1   levocetirizine (XYZAL) 5 MG tablet Take 5 mg by mouth every evening.     metoprolol tartrate (LOPRESSOR) 25 MG tablet Take 1 tablet (25 mg total) by mouth 2 (two) times  daily. 60 tablet 0   Multiple Vitamin (MULTIVITAMIN ADULT PO) Take by mouth.     nystatin (MYCOSTATIN) 100000 UNIT/ML suspension Use as directed 5 mLs in the mouth or throat daily.     OXYGEN Inhale 2 L into the lungs daily as needed (with exertion only).     polyethylene glycol (MIRALAX / GLYCOLAX) 17 g packet Take 17 g by mouth daily.     predniSONE (DELTASONE) 10 MG tablet 20 mg per day until better then 10 mg per day x 5 d and stop 100 tablet 3   sildenafil (VIAGRA) 100 MG tablet Take 100 mg by mouth daily as needed for erectile dysfunction.     Tiotropium Bromide-Olodaterol (STIOLTO RESPIMAT) 2.5-2.5 MCG/ACT AERS Inhale 2 puffs into the lungs daily. 4 g 0   No  current facility-administered medications for this visit.    SURGICAL HISTORY:  Past Surgical History:  Procedure Laterality Date   ANTERIOR FUSION LUMBAR SPINE  07/25/2012   ANTERIOR LAT LUMBAR FUSION Left 07/25/2012   Procedure: ANTERIOR LATERAL LUMBAR FUSION 1 LEVEL;  Surgeon: [name], MD;  Location: [facility];  Service: Orthopedics;  Laterality: Left;  Left sided lumbar 3-4 lateral interbody fusion with allograft and instrumentation   BACK SURGERY     BIOPSY  04/16/2020   Procedure: BIOPSY;  Surgeon: Carol Ada, MD;  Location: [facility];  Service: Endoscopy;;   COLONOSCOPY  02/07/2012   polyps rem   ESOPHAGOGASTRODUODENOSCOPY N/A 07/27/2020   Procedure: ESOPHAGOGASTRODUODENOSCOPY (EGD);  Surgeon: Carol Ada, MD;  Location: [facility];  Service: Endoscopy;  Laterality: N/A;   ESOPHAGOGASTRODUODENOSCOPY (EGD) WITH PROPOFOL N/A 04/16/2020   Procedure: ESOPHAGOGASTRODUODENOSCOPY (EGD) WITH PROPOFOL;  Surgeon: Carol Ada, MD;  Location: [facility];  Service: Endoscopy;  Laterality: N/A;   JOINT REPLACEMENT Right 02/07/2008   right knee   NODE DISSECTION Right 05/24/2015   Procedure: NODE DISSECTION;  Surgeon: [name], MD;  Location: [facility];  Service: Thoracic;  Laterality: Right;   SEGMENTECOMY Right 05/24/2015   Procedure: RIGHT LOWER LOBE SUPERIOR SEGMENTECTOMY;  Surgeon: [name], MD;  Location: [facility];  Service: Thoracic;  Laterality: Right;   SPINE SURGERY  02/06/2010   SUBMUCOSAL TATTOO INJECTION  04/16/2020   Procedure: SUBMUCOSAL TATTOO INJECTION;  Surgeon: Carol Ada, MD;  Location: [facility];  Service: Endoscopy;;   UPPER ESOPHAGEAL ENDOSCOPIC ULTRASOUND (EUS) N/A 04/16/2020   Procedure: UPPER ESOPHAGEAL ENDOSCOPIC ULTRASOUND (EUS);  Surgeon: Carol Ada, MD;  Location: [facility];  Service: Endoscopy;  Laterality: N/A;   VIDEO ASSISTED THORACOSCOPY (VATS)/WEDGE RESECTION Right 05/24/2015   Procedure: RIGHT VIDEO ASSISTED  THORACOSCOPY (VATS)/ RIGHTR LOWER LOBE WEDGE RESECTION;  Surgeon: [name], MD;  Location: [facility];  Service: Thoracic;  Laterality: Right;    REVIEW OF SYSTEMS:  A comprehensive review of systems was negative except for: Respiratory: positive for dyspnea on exertion   PHYSICAL EXAMINATION: General appearance: alert, cooperative, and no distress Head: Normocephalic, without obvious abnormality, atraumatic Neck: no adenopathy, no JVD, supple, symmetrical, trachea midline, and thyroid not enlarged, symmetric, no tenderness/mass/nodules Lymph nodes: Cervical, supraclavicular, and axillary nodes normal. Resp: clear to auscultation bilaterally Back: symmetric, no curvature. ROM normal. No CVA tenderness. Cardio: regular rate and rhythm, S1, S2 normal, no murmur, click, rub or gallop GI: soft, non-tender; bowel sounds normal; no masses,  no organomegaly Extremities: extremities normal, atraumatic, no cyanosis or edema  ECOG PERFORMANCE STATUS: 1 - Symptomatic but completely ambulatory  Blood pressure (!) 143/91, pulse 85, temperature (!)  97.5 F (36.4 C), temperature source Tympanic, resp. rate 20, height 5\' 9"  (1.753 m), weight 166 lb 8 oz (75.5 kg), SpO2 93 %.  LABORATORY DATA: Lab Results  Component Value Date   WBC 13.7 (H) 12/20/2020   HGB 16.3 12/20/2020   HCT 47.4 12/20/2020   MCV 84.8 12/20/2020   PLT 271 12/20/2020      Chemistry      Component Value Date/Time   NA 138 12/20/2020 1241   NA 141 12/25/2016 1010   K 3.3 (L) 12/20/2020 1241   K 3.6 12/25/2016 1010   CL 102 12/20/2020 1241   CO2 24 12/20/2020 1241   CO2 28 12/25/2016 1010   BUN 11 12/20/2020 1241   BUN 6.1 (L) 12/25/2016 1010   CREATININE 1.07 12/20/2020 1241   CREATININE 1.1 12/25/2016 1010      Component Value Date/Time   CALCIUM 9.6 12/20/2020 1241   CALCIUM 9.6 12/25/2016 1010   ALKPHOS 112 12/20/2020 1241   ALKPHOS 107 12/25/2016 1010   AST 22 12/20/2020 1241   AST 19 12/25/2016 1010    ALT 21 12/20/2020 1241   ALT 15 12/25/2016 1010   BILITOT 0.8 12/20/2020 1241   BILITOT 0.56 12/25/2016 1010       RADIOGRAPHIC STUDIES: CT Chest W Contrast  Result Date: 12/21/2020 CLINICAL DATA:  Lung cancer staging.  Shortness of breath. EXAM: CT CHEST WITH CONTRAST TECHNIQUE: Multidetector CT imaging of the chest was performed during intravenous contrast administration. CONTRAST:  17mL OMNIPAQUE IOHEXOL 350 MG/ML SOLN COMPARISON:  CT chest 11/05/2020 and 12/23/2019. FINDINGS: Cardiovascular: Atherosclerotic calcification of the aorta and coronary arteries. Heart size normal. No pericardial effusion. Mediastinum/Nodes: Mediastinal lymph nodes are not enlarged by CT size criteria. No hilar or axillary adenopathy. Esophagus is unremarkable. Lungs/Pleura: Bullous emphysema. Postoperative scarring in the right middle and right lower lobes. Probable tiny subpleural lymph node along the minor fissure. Lungs are otherwise clear. No pleural fluid. Airway is unremarkable. Upper Abdomen: Visualized portions of the liver, gallbladder, adrenal glands, kidneys, spleen, pancreas, stomach and bowel are grossly unremarkable. No upper abdominal adenopathy. Musculoskeletal: Degenerative changes in the spine. Vague sclerosis in the T10 vertebral body is unchanged from 12/25/2019. No worrisome lytic or sclerotic lesions. IMPRESSION: 1. No evidence of recurrent or metastatic disease. 2. Aortic atherosclerosis (ICD10-I70.0). Coronary artery calcification. 3.  Emphysema (ICD10-J43.9). Electronically Signed   By: Lorin Picket M.D.   On: 12/21/2020 10:06     ASSESSMENT AND PLAN:  This is a very pleasant 70 years old African-American male with a stage IA non-small cell lung cancer status post wedge resection of the right lower lobe in 2017. The patient is currently on observation and he is feeling fine today with no concerning complaints. The patient is doing fine today with no concerning complaints except for  shortness of breath with exertion and mild cough secondary to COPD. He had repeat CT scan of the chest performed recently.  I personally and independently reviewed the scans and discussed the results with the patient today. His scan showed no concerning findings for disease recurrence or metastasis. I recommended for him to continue on observation with repeat CT scan of the chest in 1 year. The patient was advised to call immediately if he has any other concerning symptoms in the interval. All questions were answered. The patient knows to call the clinic with any problems, questions or concerns. We can certainly see the patient much sooner if necessary.   Disclaimer: This note was dictated  with voice recognition software. Similar sounding words can inadvertently be transcribed and may not be corrected upon review.

## 2020-12-27 ENCOUNTER — Other Ambulatory Visit: Payer: Medicare Other

## 2020-12-29 ENCOUNTER — Inpatient Hospital Stay: Admission: RE | Admit: 2020-12-29 | Payer: Medicare Other | Source: Ambulatory Visit

## 2021-01-06 ENCOUNTER — Other Ambulatory Visit: Payer: Self-pay | Admitting: Cardiology

## 2021-01-06 DIAGNOSIS — I484 Atypical atrial flutter: Secondary | ICD-10-CM

## 2021-01-10 ENCOUNTER — Telehealth: Payer: Self-pay | Admitting: Internal Medicine

## 2021-01-10 ENCOUNTER — Inpatient Hospital Stay: Admission: RE | Admit: 2021-01-10 | Payer: Medicare Other | Source: Ambulatory Visit

## 2021-01-10 NOTE — Telephone Encounter (Signed)
Very unlikely but since this is not a major symptom would finish the inhaler he has, stop it for a week and go back to previous rx during that time and then rechallenge to see if comes back and if so that would mean he probably can't use it.

## 2021-01-10 NOTE — Telephone Encounter (Signed)
I have called the pt and he is aware of MW recs.  Advised that if while using the stiolto he has any wheezing or SHOB or feels like his throat gets tight to seek emergency care.  Pt voiced his understanding of this and of what MW recs are.

## 2021-01-10 NOTE — Telephone Encounter (Signed)
I have called and spoke with pt.   He stated that he started on the stiolto about 1-2 weeks ago.  He stated that this has been working very well for him and he likes how it helps.  He stated on Wednesday he noticed some red spots on his shoulders, chest and thighs.  He stated that his upper lip and been feeling funny and a little swollen.  He was seen by his PCP on Friday and she stated that this could be from the stiolto.  He has been using the medication but did not use it this morning.  Pt denied that his throat felt tight or any difficulty breathing.   He is aware to not use the medication again until he hears back from our office.  MW please advise. Thanks

## 2021-01-14 ENCOUNTER — Ambulatory Visit (INDEPENDENT_AMBULATORY_CARE_PROVIDER_SITE_OTHER)
Admission: RE | Admit: 2021-01-14 | Discharge: 2021-01-14 | Disposition: A | Discharge status: Home / Self Care | Payer: Medicare Other | Source: Ambulatory Visit | Attending: Internal Medicine | Admitting: Internal Medicine

## 2021-01-14 ENCOUNTER — Encounter: Payer: Self-pay | Admitting: Adult Health

## 2021-01-14 ENCOUNTER — Ambulatory Visit (INDEPENDENT_AMBULATORY_CARE_PROVIDER_SITE_OTHER): Payer: Medicare Other | Admitting: Adult Health

## 2021-01-14 ENCOUNTER — Other Ambulatory Visit: Payer: Self-pay

## 2021-01-14 VITALS — BP 130/70 | HR 68 | Temp 98.1°F | Ht 69.0 in | Wt 174.0 lb

## 2021-01-14 DIAGNOSIS — J9611 Chronic respiratory failure with hypoxia: Secondary | ICD-10-CM

## 2021-01-14 DIAGNOSIS — J310 Chronic rhinitis: Secondary | ICD-10-CM

## 2021-01-14 DIAGNOSIS — C3491 Malignant neoplasm of unspecified part of right bronchus or lung: Secondary | ICD-10-CM | POA: Diagnosis not present

## 2021-01-14 DIAGNOSIS — Z23 Encounter for immunization: Secondary | ICD-10-CM

## 2021-01-14 DIAGNOSIS — J449 Chronic obstructive pulmonary disease, unspecified: Secondary | ICD-10-CM

## 2021-01-14 NOTE — Patient Instructions (Addendum)
Continue on Stiolto 2 puffs daily  Flu shot today .  Continue on Oxygen 2l/m with activity as needed  Activity as tolerated.  Covid booster later this month  Saline nasal rinses Twice daily   Saline nasal gel At bedtime   Flonase nasal daily As needed   Mucinex DM Twice daily  As needed  cough/congestion  Follow up with Dr. Melvyn Novas  in 3 month and As needed   Please contact office for sooner follow up if symptoms do not improve or worsen or seek emergency care

## 2021-01-14 NOTE — Assessment & Plan Note (Signed)
Previous non-small cell carcinoma of the right lung status post resection in 2017.  Recent CT chest showed no evidence of recurrent or metastatic disease.  Continue with serial follow-up

## 2021-01-14 NOTE — Assessment & Plan Note (Signed)
Continue on oxygen 2 L with activity to maintain O2 saturations greater than 88 to 90%.  No recent increased oxygen demands.

## 2021-01-14 NOTE — Assessment & Plan Note (Signed)
Recent exacerbation now improved.  Patient is continue on Stiolto.  Mucinex as needed.  Plan  Patient Instructions  Continue on Stiolto 2 puffs daily  Flu shot today .  Continue on Oxygen 2l/m with activity as needed  Activity as tolerated.  Covid booster later this month  Saline nasal rinses Twice daily   Saline nasal gel At bedtime   Flonase nasal daily As needed   Mucinex DM Twice daily  As needed  cough/congestion  Follow up with Dr. Melvyn Novas  in 3 month and As needed   Please contact office for sooner follow up if symptoms do not improve or worsen or seek emergency care

## 2021-01-14 NOTE — Progress Notes (Signed)
@Patient  ID: Elijah Marshall, male    DOB: Oct 22, 1950, 70 y.o.   MRN: [id number]  Chief Complaint  Patient presents with   Follow-up    Referring provider: [name], MD  HPI: 70 year old male former smoker quit in 2013 followed for severe COPD and oxygen dependent respiratory failure on oxygen with activity History of stage Ia non-small cell carcinoma of the right lung status post resection in 2017  TEST/EVENTS :  CT chest December 20, 2020 no evidence of recurrent or metastatic disease, bullous emphysema,   Spirometry  03/01/12  FEV1  1.63 - Spirometry   09/05/13 FEV1  1.34 (46%) ratio 44  - PFTs   10/29/2013    FEV1    1.70 (62%) ratio 50% no better after ssaba and DLCO 48%   PFT's  05/22/2017  FEV1 1.21  (46 % ) ratio 46  p 1 % improvement from saba p anoro and albuterol 1 h prior to study with DLCO  38 % corrects to 49 % for alv volume   Allergy profile 05/16/2016 >  Eos 0. 1/  IgE  14 neg RAST  01/14/2021 Follow up ; COPD , O2 RF  Patient returns for a 1 month follow-up. Patient was admitted in October for COPD exacerbation.  Patient was treated with empiric antibiotics and steroids.  Last visit patient was continued to have some ongoing symptoms.  He was changed from [employer] inhaler to [employer] inhaler.  Given a steroid burst.  And set up for a CT sinus scan .  Patient had CT sinus today results are pending. Since last visit patient is feeling better with less cough and congestion.  Still has some lingering nasal stuffiness.  He has been using some Flonase and saline rinses. Lives at home, remains active with [employer]  Limited by knee and back pain . Retired.  Request his flu shot today.  We discussed getting the COVID booster later this month.   Allergies  Allergen Reactions   Aspirin Nausea And Vomiting   Morphine Itching   Morphine And Related Itching    Immunization History  Administered Date(s) Administered   Fluad Quad(high Dose 65+) 11/07/2018    Influenza, High Dose Seasonal PF 11/21/2016, 11/16/2017   Influenza,inj,Quad PF,6+ Mos 01/15/2015   Influenza-Unspecified 11/02/2015   PFIZER(Purple Top)SARS-COV-2 Vaccination 03/20/2019   Pneumococcal Conjugate-13 04/17/2017, 09/06/2018   Pneumococcal Polysaccharide-23 07/26/2012   Tdap 09/06/2018   Zoster Recombinat (Shingrix) 09/06/2018, 11/07/2018    Past Medical History:  Diagnosis Date   Allergy    Arthritis    BPH with obstruction/lower urinary tract symptoms 2016   Nocturia   Cancer ([place])    unsure at this time   Cancer of lower lobe of right lung ([place])    COPD (chronic obstructive pulmonary disease) ([place])    per patient's health survey - he put a ?   Depression    Dysrhythmia    Erectile dysfunction due to arterial insufficiency    GERD (gastroesophageal reflux disease)    Hyperlipidemia    Hypertension    Non-small cell carcinoma of lung, right ([place]) 06/24/2015   Pneumonia    Shortness of breath    Ulcer     Tobacco History: Social History   Tobacco Use  Smoking Status Former   Packs/day: 1.00   Years: 35.00   Pack years: 35.00   Types: Cigarettes   Quit date: 09/24/2011   Years since quitting: 9.3  Smokeless Tobacco Never   Counseling  given: Not Answered   Outpatient Medications Prior to Visit  Medication Sig Dispense Refill   albuterol (VENTOLIN HFA) 108 (90 Base) MCG/ACT inhaler Inhale 2 puffs into the lungs every 6 (six) hours as needed for wheezing or shortness of breath.     amitriptyline (ELAVIL) 25 MG tablet TAKE 1 TABLET (25 MG TOTAL) BY MOUTH AT BEDTIME AS NEEDED FOR SLEEP. 90 tablet 0   atorvastatin (LIPITOR) 40 MG tablet TAKE 1 TABLET BY MOUTH EVERY DAY (Patient taking differently: Take 40 mg by mouth daily.) 90 tablet 0   baclofen (LIORESAL) 10 MG tablet Take 10 mg by mouth daily as needed for muscle spasms.     Brimonidine Tartrate (LUMIFY) 0.025 % SOLN Apply 1 drop to eye daily.     famotidine (PEPCID) 40 MG tablet Take 1 tablet (40 mg  total) by mouth 2 (two) times daily. (Patient taking differently: Take 40 mg by mouth daily as needed for heartburn.) 180 tablet 3   hydrochlorothiazide (HYDRODIURIL) 25 MG tablet Take 25 mg by mouth daily.     HYDROcodone-acetaminophen (NORCO/VICODIN) 5-325 MG tablet Take 1-2 tablets by mouth every 4 (four) hours as needed for moderate pain or severe pain. 40 tablet 0   lansoprazole (PREVACID) 30 MG capsule TAKE 1 CAPSULE (30 MG TOTAL) BY MOUTH 2 (TWO) TIMES DAILY. (Patient taking differently: Take 30 mg by mouth 2 (two) times daily before a meal.) 180 capsule 1   levocetirizine (XYZAL) 5 MG tablet Take 5 mg by mouth every evening.     metoprolol tartrate (LOPRESSOR) 25 MG tablet TAKE 1 TABLET BY MOUTH TWICE A DAY 180 tablet 3   Multiple Vitamin (MULTIVITAMIN ADULT PO) Take by mouth.     nystatin (MYCOSTATIN) 100000 UNIT/ML suspension Use as directed 5 mLs in the mouth or throat daily.     OXYGEN Inhale 2 L into the lungs daily as needed (with exertion only).     polyethylene glycol (MIRALAX / GLYCOLAX) 17 g packet Take 17 g by mouth daily.     sildenafil (VIAGRA) 100 MG tablet Take 100 mg by mouth daily as needed for erectile dysfunction.     Tiotropium Bromide-Olodaterol (STIOLTO RESPIMAT) 2.5-2.5 MCG/ACT AERS Inhale 2 puffs into the lungs daily. 4 g 0   predniSONE (DELTASONE) 10 MG tablet 20 mg per day until better then 10 mg per day x 5 d and stop (Patient not taking: Reported on 01/14/2021) 100 tablet 3   No facility-administered medications prior to visit.     Review of Systems:   Constitutional:   No  weight loss, night sweats,  Fevers, chills, fatigue, or  lassitude.  HEENT:   No headaches,  Difficulty swallowing,  Tooth/dental problems, or  Sore throat,                No sneezing, itching, ear ache,  +nasal congestion, post nasal drip,   CV:  No chest pain,  Orthopnea, PND, swelling in lower extremities, anasarca, dizziness, palpitations, syncope.   GI  No heartburn, indigestion,  abdominal pain, nausea, vomiting, diarrhea, change in bowel habits, loss of appetite, bloody stools.   Resp: .  No chest wall deformity  Skin: no rash or lesions.  GU: no dysuria, change in color of urine, no urgency or frequency.  No flank pain, no hematuria   MS:  No joint pain or swelling.  No decreased range of motion.  No back pain.    Physical Exam  BP 130/70 (BP Location: Left Arm, Patient  Position: Sitting, Cuff Size: Normal)   Pulse 68   Temp 98.1 F (36.7 C) (Oral)   Ht 5\' 9"  (1.753 m)   Wt 174 lb (78.9 kg)   SpO2 93%   BMI 25.70 kg/m   GEN: A/Ox3; pleasant , NAD, well nourished    HEENT:  [place]/AT,   NOSE-clear drainage  THROAT-clear, no lesions, no postnasal drip or exudate noted.   NECK:  Supple w/ fair ROM; no JVD; normal carotid impulses w/o bruits; no thyromegaly or nodules palpated; no lymphadenopathy.    RESP  Clear  P & A; w/o, wheezes/ rales/ or rhonchi. no accessory muscle use, no dullness to percussion  CARD:  RRR, no m/r/g, no peripheral edema, pulses intact, no cyanosis or clubbing.  GI:   Soft & nt; nml bowel sounds; no organomegaly or masses detected.   Musco: Warm bil, no deformities or joint swelling noted.   Neuro: alert, no focal deficits noted.    Skin: Warm, no lesions or rashes    Lab Results:       ProBNP No results found for: PROBNP  Imaging: CT Chest W Contrast  Result Date: 12/21/2020 CLINICAL DATA:  Lung cancer staging.  Shortness of breath. EXAM: CT CHEST WITH CONTRAST TECHNIQUE: Multidetector CT imaging of the chest was performed during intravenous contrast administration. CONTRAST:  37mL OMNIPAQUE IOHEXOL 350 MG/ML SOLN COMPARISON:  CT chest 11/05/2020 and 12/23/2019. FINDINGS: Cardiovascular: Atherosclerotic calcification of the aorta and coronary arteries. Heart size normal. No pericardial effusion. Mediastinum/Nodes: Mediastinal lymph nodes are not enlarged by CT size criteria. No hilar or axillary adenopathy.  Esophagus is unremarkable. Lungs/Pleura: Bullous emphysema. Postoperative scarring in the right middle and right lower lobes. Probable tiny subpleural lymph node along the minor fissure. Lungs are otherwise clear. No pleural fluid. Airway is unremarkable. Upper Abdomen: Visualized portions of the liver, gallbladder, adrenal glands, kidneys, spleen, pancreas, stomach and bowel are grossly unremarkable. No upper abdominal adenopathy. Musculoskeletal: Degenerative changes in the spine. Vague sclerosis in the T10 vertebral body is unchanged from 12/25/2019. No worrisome lytic or sclerotic lesions. IMPRESSION: 1. No evidence of recurrent or metastatic disease. 2. Aortic atherosclerosis (ICD10-I70.0). Coronary artery calcification. 3.  Emphysema (ICD10-J43.9). Electronically Signed   By: Lorin Picket M.D.   On: 12/21/2020 10:06      PFT Results Latest Ref Rng & Units 05/22/2017 01/10/2016 10/29/2013  FVC-Pre L 2.57 2.90 3.43  FVC-Predicted Pre % 74 83 97  FVC-Post L 2.66 2.91 3.39  FVC-Predicted Post % 76 83 96  Pre FEV1/FVC % % 46 47 50  Post FEV1/FCV % % 46 47 51  FEV1-Pre L 1.19 1.36 1.70  FEV1-Predicted Pre % 45 51 62  FEV1-Post L 1.21 1.38 1.71  DLCO uncorrected ml/min/mmHg 10.88 12.41 13.77  DLCO UNC% % 38 43 48  DLCO corrected ml/min/mmHg - 11.69 -  DLCO COR %Predicted % - 41 -  DLVA Predicted % 49 47 53  TLC L 6.83 6.87 5.25  TLC % Predicted % 106 107 82  RV % Predicted % 162 152 62    No results found for: NITRICOXIDE      Assessment & Plan:   COPD GOLD III Recent exacerbation now improved.  Patient is continue on Stiolto.  Mucinex as needed.  Plan  Patient Instructions  Continue on Stiolto 2 puffs daily  Flu shot today .  Continue on Oxygen 2l/m with activity as needed  Activity as tolerated.  Covid booster later this month  Saline nasal rinses Twice  daily   Saline nasal gel At bedtime   Flonase nasal daily As needed   Mucinex DM Twice daily  As needed   cough/congestion  Follow up with Dr. Melvyn Novas  in 3 month and As needed   Please contact office for sooner follow up if symptoms do not improve or worsen or seek emergency care           Chronic rhinitis Continue on saline nasal rinses.  May use saline nasal gel at bedtime.  Flonase as needed. CT sinus is pending  Chronic respiratory failure with hypoxia (HCC) Continue on oxygen 2 L with activity to maintain O2 saturations greater than 88 to 90%.  No recent increased oxygen demands.  Non-small cell carcinoma of lung, right (HCC) Previous non-small cell carcinoma of the right lung status post resection in 2017.  Recent CT chest showed no evidence of recurrent or metastatic disease.  Continue with serial follow-up     Rexene Edison, NP 01/14/2021

## 2021-01-14 NOTE — Assessment & Plan Note (Signed)
Continue on saline nasal rinses.  May use saline nasal gel at bedtime.  Flonase as needed. CT sinus is pending

## 2021-01-26 ENCOUNTER — Telehealth: Payer: Self-pay | Admitting: Internal Medicine

## 2021-01-26 NOTE — Telephone Encounter (Signed)
ATC LVMTCB x 1  

## 2021-01-28 NOTE — Telephone Encounter (Signed)
Patient returning call.

## 2021-02-01 MED ORDER — STIOLTO RESPIMAT 2.5-2.5 MCG/ACT IN AERS: 2.0000 | INHALATION_SPRAY | Freq: Every day | RESPIRATORY_TRACT | 0 refills | Status: DC

## 2021-02-01 NOTE — Telephone Encounter (Signed)
I spoke with the patient and he wants to know if you are okay with him to get a couple of samples until next year. Please advise.

## 2021-02-01 NOTE — Telephone Encounter (Signed)
Ok for 2 stiolto samples

## 2021-02-01 NOTE — Telephone Encounter (Signed)
Called and spoke with patient. He is aware that I will place 2 samples of Stiolto at the front desk for him.   Nothing further needed at time of call.

## 2021-02-17 ENCOUNTER — Ambulatory Visit: Payer: Medicare Other | Admitting: Cardiology

## 2021-02-28 ENCOUNTER — Other Ambulatory Visit: Payer: Self-pay

## 2021-02-28 ENCOUNTER — Ambulatory Visit: Payer: Medicare Other | Admitting: Cardiology

## 2021-02-28 ENCOUNTER — Encounter: Payer: Self-pay | Admitting: Cardiology

## 2021-02-28 VITALS — BP 129/79 | HR 60 | Temp 98.5°F | Resp 16 | Ht 69.0 in | Wt 172.0 lb

## 2021-02-28 DIAGNOSIS — I484 Atypical atrial flutter: Secondary | ICD-10-CM

## 2021-02-28 DIAGNOSIS — I10 Essential (primary) hypertension: Secondary | ICD-10-CM

## 2021-02-28 MED ORDER — METOPROLOL SUCCINATE ER 50 MG PO TB24: 50.0000 mg | ORAL_TABLET | Freq: Every day | ORAL | 3 refills | Status: DC

## 2021-02-28 NOTE — Progress Notes (Signed)
Patient referred by Sherren Mocha, MD for tachycardia  Subjective:   Elijah Marshall, male    DOB: 28-Apr-1950, 71 y.o.   MRN: [id number]   Chief Complaint  Patient presents with   Atrial Flutter   Hypertension   Follow-up    HPI   71 y/o [place] American male with hypertension, paroxysmal atrial flutter, COPD GOLD III, former smoker, h/o non-small cell carcinoma of right lung s/p lobectomy, h/o recent gastric surgery  Patient is doing well.  He has not had any recent palpitation symptoms.  He lost a brother and a sister recently, and is coping through that loss.  He asks me, whether he should be on Eliquis.  Reportedly, several of his providers have asked him this question.  Current Outpatient Medications on File Prior to Visit  Medication Sig Dispense Refill   albuterol (VENTOLIN HFA) 108 (90 Base) MCG/ACT inhaler Inhale 2 puffs into the lungs every 6 (six) hours as needed for wheezing or shortness of breath.     amitriptyline (ELAVIL) 25 MG tablet TAKE 1 TABLET (25 MG TOTAL) BY MOUTH AT BEDTIME AS NEEDED FOR SLEEP. 90 tablet 0   atorvastatin (LIPITOR) 40 MG tablet TAKE 1 TABLET BY MOUTH EVERY DAY (Patient taking differently: Take 40 mg by mouth daily.) 90 tablet 0   baclofen (LIORESAL) 10 MG tablet Take 10 mg by mouth daily as needed for muscle spasms.     Brimonidine Tartrate (LUMIFY) 0.025 % SOLN Apply 1 drop to eye daily.     famotidine (PEPCID) 40 MG tablet Take 1 tablet (40 mg total) by mouth 2 (two) times daily. (Patient taking differently: Take 40 mg by mouth daily as needed for heartburn.) 180 tablet 3   hydrochlorothiazide (HYDRODIURIL) 25 MG tablet Take 25 mg by mouth daily.     HYDROcodone-acetaminophen (NORCO/VICODIN) 5-325 MG tablet Take 1-2 tablets by mouth every 4 (four) hours as needed for moderate pain or severe pain. 40 tablet 0   lansoprazole (PREVACID) 30 MG capsule TAKE 1 CAPSULE (30 MG TOTAL) BY MOUTH 2 (TWO) TIMES DAILY. (Patient taking differently: Take 30  mg by mouth 2 (two) times daily before a meal.) 180 capsule 1   levocetirizine (XYZAL) 5 MG tablet Take 5 mg by mouth every evening.     metoprolol tartrate (LOPRESSOR) 25 MG tablet TAKE 1 TABLET BY MOUTH TWICE A DAY 180 tablet 3   Multiple Vitamin (MULTIVITAMIN ADULT PO) Take by mouth.     nystatin (MYCOSTATIN) 100000 UNIT/ML suspension Use as directed 5 mLs in the mouth or throat daily.     OXYGEN Inhale 2 L into the lungs daily as needed (with exertion only).     polyethylene glycol (MIRALAX / GLYCOLAX) 17 g packet Take 17 g by mouth daily.     predniSONE (DELTASONE) 10 MG tablet 20 mg per day until better then 10 mg per day x 5 d and stop (Patient not taking: Reported on 01/14/2021) 100 tablet 3   sildenafil (VIAGRA) 100 MG tablet Take 100 mg by mouth daily as needed for erectile dysfunction.     Tiotropium Bromide-Olodaterol (STIOLTO RESPIMAT) 2.5-2.5 MCG/ACT AERS Inhale 2 puffs into the lungs daily. 4 g 0   Tiotropium Bromide-Olodaterol (STIOLTO RESPIMAT) 2.5-2.5 MCG/ACT AERS Inhale 2 puffs into the lungs daily. 8 g 0   No current facility-administered medications on file prior to visit.    Cardiovascular and other pertinent studies:  EKG 02/28/2021: Sinus rhythm 77 bpm Normal EKG  Echocardiogram 03/25/2020:  Left ventricle cavity is normal in size and wall thickness. Normal global  wall motion. Normal LV systolic function with EF 65%. Normal diastolic  filling pattern.  No significant valvular abnormality.  normal right atrial pressure.   Mobile cardiac telemetry 6 days 03/17/2020 - 03/24/2020: Dominant rhythm: Sinus. HR 54-110 bpm. Avg HR 77 bpm. <1% isolated SVE, couplet <1% isolated VE, no couplet/triplets. No atrial fibrillation/atrial flutter/SVT/VT/high grade AV block, sinus pause >3sec noted. 1 patient triggered event correlates with artifact  EKG 03/31/2020: Sinus rhythm 81 bpm  RSR(V1) -nondiagnostic  Echocardiogram 03/25/2020:  Left ventricle cavity is normal in size  and wall thickness. Normal global  wall motion. Normal LV systolic function with EF 65%. Normal diastolic  filling pattern.  No significant valvular abnormality.  normal right atrial pressure.   Recent labs: 12/20/2020: Glucose 120, BUN/Cr 11/1.07. EGFR >60. Na/K 138/3.3. Rest of the CMP normal H/H 16/47. MCV 87. Platelets 271  07/29/2020: Glucose 106, BUN/Cr 8/1.14. EGFR >60. Na/K 134/4.4.  H/H 14/45. MCV 89. Platelets 207  07/30/2019: Glucose 94, BUN/Cr 8/1.11. EGFR 78. Na/K 138/4.3. Rest of the CMP normal H/H 17.1/61.9. MCV 90.4. Platelets 268 HbA1C 6.1% Chol 181, TG 112, HDL 55, LDL 104 TSH N/A   Review of Systems  Cardiovascular:  Negative for chest pain, dyspnea on exertion, leg swelling, palpitations and syncope.        Vitals:   02/28/21 1118  BP: 129/79  Pulse: 60  Resp: 16  Temp: 98.5 F (36.9 C)  SpO2: 98%    Body mass index is 25.4 kg/m. Filed Weights   02/28/21 1118  Weight: 172 lb (78 kg)     Objective:   Physical Exam Vitals and nursing note reviewed.  Constitutional:      General: He is not in acute distress. Neck:     Vascular: No JVD.  Cardiovascular:     Rate and Rhythm: Normal rate and regular rhythm.     Heart sounds: Normal heart sounds. No murmur heard. Pulmonary:     Effort: Pulmonary effort is normal.     Breath sounds: Normal breath sounds. No wheezing or rales.  Abdominal:     Comments: Healed periumbilical scar  Musculoskeletal:     Right lower leg: No edema.     Left lower leg: No edema.         Assessment & Recommendations:   71 y/o [place] American male with hypertension, paroxysmal atrial flutter, COPD GOLD III, former smoker, h/o non-small cell carcinoma of right lung s/p lobectomy, h/o recent gastric surgery  Paroxysmal atrial flutter: Episode in February 2022, with no clinical or EKG evidence of recurrence. CHA2DS2VASc score 2, annual stroke risk 2.2% In absence of recurrent atrial flutter, he does not need  anticoagulation at this time.  I have encouraged him to consider wearing a smart watch to monitor for any recurrence. Change metoprolol tartrate 25 mg twice daily to metoprolol succinate 50 mg daily for ease of administration.  Hypertension: Well-controlled.  Will get results of recent lipid panel from PCP.  F/u in 6 months   Nigel Mormon, MD Pager: [phone] Office: [phone]

## 2021-03-01 ENCOUNTER — Telehealth: Payer: Self-pay | Admitting: Internal Medicine

## 2021-03-01 MED ORDER — STIOLTO RESPIMAT 2.5-2.5 MCG/ACT IN AERS: 2.0000 | INHALATION_SPRAY | Freq: Every day | RESPIRATORY_TRACT | 5 refills | Status: DC

## 2021-03-01 NOTE — Telephone Encounter (Signed)
Rx for Stiolto has been sent to pharmacy for pt. Called and spoke with pt letting him know this had been done and he verbalized understanding. Nothing further needed.

## 2021-03-22 ENCOUNTER — Telehealth: Payer: Self-pay | Admitting: Dietician

## 2021-03-22 NOTE — Telephone Encounter (Signed)
Nutrition  Spoke with patient via telephone after receiving voice mail with request for call. Patient reports poor appetite and loss of weight in the past couple of months. Patient shares he has experienced recent loss of his brother as well as his cousin at Christmas. Patient reports he "is struggling to move past this and would like someone to talk to"  RD offered active listening and encouragement Patient receptive to being contacted by [facility] - referral sent Suggested Grief Share for weekly support group option - upcoming sessions and contact information printed for pt to pick up One complimentary case of Ensure Plus left at registration desk for pt to pick up  Patient appreciative of support provided today Patient has contact information   Elijah Marshall, [place], [place] [phone]

## 2021-03-24 ENCOUNTER — Encounter: Payer: Self-pay | Admitting: General Practice

## 2021-03-24 NOTE — Progress Notes (Signed)
[place] Spiritual Care Note  Referred by Yancey Flemings for emotional and grief support. Reached Elijah Marshall by phone at an inconvenient time, so we plan to speak later today instead.   [place], [place], [facility] Pager [phone] Voicemail [phone]

## 2021-03-24 NOTE — Progress Notes (Signed)
[facility] Spiritual Care Note  Left voicemail encouraging return call. Will try again next week if needed.   [place], [place], [facility] Pager [phone] Voicemail [phone]

## 2021-03-28 ENCOUNTER — Encounter: Payer: Self-pay | Admitting: General Practice

## 2021-03-28 NOTE — Progress Notes (Signed)
[facility] Spiritual Care Note  Returned call from Elijah Marshall to provide grief resources and support, including empathic listening, emotional support, normalization of feelings, and affirmation of strengths.  Elijah Marshall has significant self-awareness about his grieving process and the complications of a series of close losses--as well as a gift for verbalizing these feelings and connections.  Encouraged him to explore free 1:1 grief counseling and sibling loss support group at [place]; as a backup, suggested [employer]'s Therapist Finder feature for doing targeted searches.  We plan to follow up for a pastoral check-in phone call in two weeks, and he knows to reach out in the meantime as needed/desired.   [place], [place], [facility] Pager [phone] Voicemail [phone]

## 2021-04-12 ENCOUNTER — Encounter: Payer: Self-pay | Admitting: General Practice

## 2021-04-12 NOTE — Progress Notes (Signed)
[facility] Spiritual Care Note ? ?Left voicemail to check in about Mr Hammonds's progress in exploring the resource suggestions I made. Encouraged return call. ? ? ?Chaplain Lorrin Jackson, MDiv, [facility] ?Pager [phone] ?Voicemail [phone]  ?

## 2021-04-19 ENCOUNTER — Encounter: Payer: Self-pay | Admitting: General Practice

## 2021-04-19 NOTE — Progress Notes (Signed)
[facility] Spiritual Care Note ? ?Received return call from Elijah Marshall. His discernment about what route to choose for emotional support (pastor, grief counselor, general counselor?) has been slowed a bit by his recovery from a root canal. If he desires, we may set up a brief series of Spiritual Care sessions to help him process his needs and discern further. As he becomes more clear on his next steps, he plans to phone chaplain again.  ? ? ?Chaplain Lorrin Jackson, MDiv, [facility] ?Pager [phone] ?Voicemail [phone]  ?

## 2021-05-03 ENCOUNTER — Ambulatory Visit: Payer: Medicare Other | Admitting: Internal Medicine

## 2021-05-23 ENCOUNTER — Encounter: Payer: Self-pay | Admitting: General Practice

## 2021-05-23 NOTE — Progress Notes (Signed)
[place] [place] Spiritual Care Note ? ?Received and returned call from Mr Cupp. We set up a phone appointment for Wednesday 4/19 at noon to speak in more detail. ? ? ?Chaplain Lorrin Jackson, MDiv, [facility] ?Pager [phone] ?Voicemail [phone]  ?

## 2021-05-25 ENCOUNTER — Encounter: Payer: Self-pay | Admitting: General Practice

## 2021-05-25 NOTE — Progress Notes (Signed)
[place] Spiritual Care Note ? ?Met with Mr Calabrese by phone for 1-hour appointment as planned, providing empathic listening, normalization of feelings, grief education, and emotional support as he processed layers of grief and loss. ? ?Mr Dimmer is working hard to prepare himself for his sister-in-law's funeral this weekend, which will likely stir up feelings about previous family and other losses, including the deaths of his sister, brother, and cousin. He has developed a plan for supporting his wife and caring for himself through and after the service and family time. ? ?We plan to follow up by phone on Friday, April 28 at 2pm to process the weekend, related feelings, and anticipated test results. ? ? ?Chaplain Lorrin Jackson, MDiv, [facility] ?Pager [phone] ?Voicemail [phone]  ?

## 2021-05-29 NOTE — Progress Notes (Signed)
? ?Subjective:  ? ?Patient ID: Elijah Marshall, male    DOB: 11-07-1950  MRN: [id number] ? ? ?Brief patient profile:  ?71  yobm quit smoking 2013  and able work out at [employer] and did fine until hurt back 2011 then 2 back surgeries and knee surgery  and more noticeable doe  since then but also limited by back and s/p sup segmentectomy [facility] lung ca Ia 05/2015  referred to pulmonary clinic 09/05/2013 for ? Copd  With GOLD II criteria 10/29/13 and confirmed 01/10/2016  ? ? ? ?History of Present Illness  ?09/05/2013 1st [facility] office visit/ Suzane Vanderweide  ?Chief Complaint  ?Patient presents with  ? Pulmonary Consult  ?  Referred per Dr. Norberto Sorenson.  Pt c/o SOB for the past 3 yrs. He states that he was dxed with COPD back in 2012 or 2013.  He states that that he sometimes has trouble with breathing when walking up stairs and lifting things.    ?rx spiriva first, alb, dulera not as effective  ?No problem at rest or supine ?Assoc nasal congestion much better p shot (?depomedrol) and bad again x sev years corresponds to worse doe  ?Ex = walking one mile slower pace than wife,trouble with hills, worse in heat  ?Bad hb even on dexilant  ?Min dry cough  ?rec ?Stop spiriva  ?Start anoro two puffs off one click each am ?Zantac 150 mg one at  Bedtime ?Prednisone 10 mg take  4 each am x 2 days,   2 each am x 2 days,  1 each am x 2 days and stop  ?GERD diet ? ? ?  ?11/12/2020  f/u ov/Heaton Sarin re: GOLD 3    maint on breztri with baseline saba = rarely   ?Chief Complaint  ?Patient presents with  ? Follow-up  ?  SOB has slightly improved, coughing has worsened with phlegm  ? Dyspnea:  not walking much this week, flared 9/26 and ended up in ER 9/30 ?Cough: mucus is yellowish finished up zpak  11/10/20  ?Sleeping: kept up by cough, sense of throat congestion, flat bed with 3-4 pillows  ?SABA use: not using  ?02: only with ex  ?Covid status:   vax x 3  ?Rec ?Prevacid 30 mg Take 30- 60 min before your first and last meals of the day  ?For cough >   mucinex dm 1200 mg every 12 hours  as needed  ?Flonase is twice daily each nostril (point to opposite ear)  ?Prednisone 10 mg take  4 each am x 2 days,   2 each am x 2 days,  1 each am x 2 days and stop  ?Plan A = Automatic = Always=   Breztri Take 2 puffs first thing in am and then another 2 puffs about 12 hours later.  ?Work on inhaler technique:  ?Plan B = Backup (to supplement plan A, not to replace it) ?Only use your albuterol inhaler as a rescue medication  ?GERD diet  ? ?12/08/20 called in pred x 6 days/ zpak  ? ?12/17/2020  Acute ov/Kissie Ziolkowski re: GOLD 3 "copd flare" dates all the way to Sept 2022   maint on breztri 2bid /  not taking ppi ac  ?Chief Complaint  ?Patient presents with  ? Follow-up  ?  Pt states f/u for cough he have seen some improvement this morning  ?Dyspnea:  ? Some better / doing lowe's shopping ok pushing cart  ?Cough: severe almost to point of  choking despite pred/ zpak ?Sleeping: bed is flat / 3 pillows worse first thing in am  ?SABA use: a few times since d/c from hospital / no neb  ?02: 2.5 lpm prn  ?Covid status:   covd x 2 / never infected  ?Rec ?Flonase ( nasal steroids) have no immediate benefit in terms of improving symptoms  ?Stop breztri  ?start stiolto 2 puffs each  and prednisone 10 mg 2 each am until better then 1 daily for 5 days and stop  ?For cough mucinex dm  every 12 hours and use the flutter valve as much as possible  ?We will call you to set up sinus CT > nl ?Be sure  Prevacid is 30 mg Take 30- 60 min before your first and last meals of the day and take pepcid 20 mg after supper and bedtime  ?GERD diet reviewed, bed blocks rec  ?  ?EGD  05/19/21   Dr Elnoria Howard  ? ? ?05/30/2021  f/u ov/Maceo Hernan re: GOLD 3  copd   maint on stiolto   ?Chief Complaint  ?Patient presents with  ? Acute Visit  ?  Increased nasal congestion x 2 wks. He has had occ cough with grey sputum. He also has noticed some wheezing. He is using his albuterol inhaler about 2 x per wk.   ? Dyspnea:  lowe's shopping is ok /  last prednisone x one month prior to OV   ?Cough: sporadic/ occ slt dark mucus but mostly not productive nor is throat clearing ?Sleeping: flat bed 3 pillows - no noct throat symptoms ?SABA use: less now. ?02: 2.5 lpm prn  ?  ? ? ?No obvious day to day or daytime variability or assoc   mucus plugs or hemoptysis or cp or chest tightness,   or overt  hb symptoms.  ? ?Sleeping ok  without nocturnal  or early am exacerbation  of respiratory  c/o's or need for noct saba. Also denies any obvious fluctuation of symptoms with weather or environmental changes or other aggravating or alleviating factors except as outlined above  ? ?No unusual exposure hx or h/o childhood pna/ asthma or knowledge of premature birth. ? ?Current Allergies, Complete Past Medical History, Past Surgical History, Family History, and Social History were reviewed in [employer] record. ? ?ROS  The following are not active complaints unless bolded ?Hoarseness, sore throat, dysphagia, dental problems, itching, sneezing,  nasal congestion or discharge of excess mucus or purulent secretions, ear ache,   fever, chills, sweats, unintended wt loss or wt gain, classically pleuritic or exertional cp,  orthopnea pnd or arm/hand swelling  or leg swelling, presyncope, palpitations, abdominal pain, anorexia, nausea, vomiting, diarrhea  or change in bowel habits or change in bladder habits, change in stools or change in urine, dysuria, hematuria,  rash, arthralgias, visual complaints, headache, numbness, weakness or ataxia or problems with walking or coordination,  change in mood or  memory. ?      ? ?Current Meds  ?Medication Sig  ? albuterol (VENTOLIN HFA) 108 (90 Base) MCG/ACT inhaler Inhale 2 puffs into the lungs every 6 (six) hours as needed for wheezing or shortness of breath.  ? atorvastatin (LIPITOR) 40 MG tablet TAKE 1 TABLET BY MOUTH EVERY DAY (Patient taking differently: Take 40 mg by mouth daily.)  ? baclofen (LIORESAL) 10 MG  tablet Take 10 mg by mouth daily as needed for muscle spasms.  ? Brimonidine Tartrate (LUMIFY) 0.025 % SOLN Apply 1 drop to eye daily.  ?  famotidine (PEPCID) 40 MG tablet Take 1 tablet (40 mg total) by mouth 2 (two) times daily. (Patient taking differently: Take 40 mg by mouth daily as needed for heartburn.)  ? hydrochlorothiazide (HYDRODIURIL) 25 MG tablet Take 25 mg by mouth daily.  ? HYDROcodone-acetaminophen (NORCO/VICODIN) 5-325 MG tablet Take 1-2 tablets by mouth every 4 (four) hours as needed for moderate pain or severe pain.  ? lansoprazole (PREVACID) 30 MG capsule TAKE 1 CAPSULE (30 MG TOTAL) BY MOUTH 2 (TWO) TIMES DAILY. (Patient taking differently: Take 30 mg by mouth 2 (two) times daily before a meal.)  ? levocetirizine (XYZAL) 5 MG tablet Take 5 mg by mouth every evening.  ? metoprolol succinate (TOPROL-XL) 50 MG 24 hr tablet Take 1 tablet (50 mg total) by mouth daily. Take with or immediately following a meal.  ? Multiple Vitamin (MULTIVITAMIN ADULT PO) Take by mouth.  ? OXYGEN Inhale 2 L into the lungs daily as needed (with exertion only).  ? polyethylene glycol (MIRALAX / GLYCOLAX) 17 g packet Take 17 g by mouth daily.  ? sildenafil (VIAGRA) 100 MG tablet Take 100 mg by mouth daily as needed for erectile dysfunction.  ? Tiotropium Bromide-Olodaterol (STIOLTO RESPIMAT) 2.5-2.5 MCG/ACT AERS Inhale 2 puffs into the lungs daily.  ?    ? ?Objective:  ?Physical Exam ? ?Wts ? ?05/30/2021        171 ?12/17/2020      167  ?11/12/2020        168  ?09/28/2020        168  ?07/28/2019        179  ? 05/20/2019       178   ?12/17/2018      182  ?04/01/2018        182  ?12/28/2017      176  ?08/27/2017        176  ?06/22/2017        177  ?05/22/2017        183  ?04/19/2017        179      ?07/31/2016        182  ?05/16/2016        181  ?04/28/2016        185  ?01/10/2016        179    ?11/19/15 182 lb (82.6 kg)  ?11/11/15 180 lb (81.6 kg)  ?11/04/15 181 lb 3.5 oz (82.2 kg)  ?  ?Vital signs reviewed  05/30/2021  - Note at rest 02  sats  94% on RA  ? ?General appearance:    amb bm with hocking freq   ? ? HEENT : oropharynx clear, nasal turbinates nl and yet constantly clearing throat ? ? ?NECK :  without JVD/Nodes/TM/ nl carotid upst

## 2021-05-30 ENCOUNTER — Encounter: Payer: Self-pay | Admitting: Internal Medicine

## 2021-05-30 ENCOUNTER — Ambulatory Visit: Payer: Medicare Other | Admitting: Internal Medicine

## 2021-05-30 DIAGNOSIS — J449 Chronic obstructive pulmonary disease, unspecified: Secondary | ICD-10-CM

## 2021-05-30 DIAGNOSIS — J9611 Chronic respiratory failure with hypoxia: Secondary | ICD-10-CM | POA: Diagnosis not present

## 2021-05-30 DIAGNOSIS — J310 Chronic rhinitis: Secondary | ICD-10-CM

## 2021-05-30 NOTE — Patient Instructions (Addendum)
Stiolto 2 puffs each  and if worse worse prednisone 10 mg 2 each am until better then 1 daily for 5 days and stop  ? ?GERD (REFLUX)  is an extremely common cause of respiratory symptoms just like yours , many times with no obvious heartburn at all.  ? ? It can be treated with medication, but also with lifestyle changes including elevation of the head of your bed (ideally with 6 -8inch blocks under the headboard of your bed),  Smoking cessation, avoidance of late meals, excessive alcohol, and avoid fatty foods, chocolate, peppermint, colas, red wine, and acidic juices such as orange juice.  ?NO MINT OR MENTHOL PRODUCTS SO NO COUGH DROPS  ?USE SUGARLESS CANDY INSTEAD (Jolley ranchers or Stover's or Life Savers) or even ice chips will also do - the key is to swallow to prevent all throat clearing. ?NO OIL BASED VITAMINS - use powdered substitutes.  Avoid fish oil when coughing.  ? ?I will be referring you for ENT evaluation since are not better on the flonase  ? ? ?Please schedule a follow up visit in 6  months but call sooner if needed  ? ? ? ? ? ?

## 2021-05-31 ENCOUNTER — Encounter: Payer: Self-pay | Admitting: Internal Medicine

## 2021-05-31 NOTE — Assessment & Plan Note (Signed)
Dx 2017 ?SATURATION QUALIFICATIONS:  11/19/2015  ?Patient Saturations on Room Air at Rest = 96% ?Patient Saturations on Room Air while Ambulating = 86% ?Patient Saturations on 2  Liters of oxygen while Ambulating = 96% ?- 05/16/2016 :   Saturations on Room Air at Rest = 88%--- increased to 99% on 2lpm continuous with activity   ?-  07/28/2019   Walked RA  2 laps @ approx 268ft each @ avg pace  stopped due to  sob with sats still 91%  ?- 09/28/2020   Walked on RA x  2  lap(s) =  approx 500 @ avg pace,  with lowest 02 sats 91% and no sob  ? ? ?Reviewed goal of keeping sats > 90% at all times ?

## 2021-05-31 NOTE — Assessment & Plan Note (Addendum)
Sinus ct 10/22/13  : Normal paranasal sinuses. ?- rechallenge with singulair 10/29/2013 > d/c 01/28/2014 as not better  ?- repeat sinus CT 04/08/15 > neg  ?- Allergy profile 05/16/2016 >  Eos 0. 1/  IgE  14 neg RAST ?- repeat sinus CT  01/14/21 nl   ?- referred to ENT 05/30/2021  ? ?I strongly suspect this is habitual throat clearing as a manifestation of UACS (not absence sleeping)  but he's never had ent eval and is former smoker so rec  ENT eval next  then consider tritration of gabapentin as high as 1200 mg daily. ? ?Discussed in detail all the  indications, usual  risks and alternatives  relative to the benefits with patient who agrees to proceed with w/u as outlined.   ? ?    ?  ? ?Each maintenance medication was reviewed in detail including emphasizing most importantly the difference between maintenance and prns and under what circumstances the prns are to be triggered using an action plan format where appropriate. ? ?Total time for H and P, chart review, counseling, reviewing smi/hfa/neb/02 device(s) and generating customized AVS unique to this office visit / same day charting = 25 min  ?      ?

## 2021-05-31 NOTE — Assessment & Plan Note (Signed)
Quit smoking 2013 ?- Spirometry  03/01/12  FEV1  1.63 ?- Spirometry   09/05/13 FEV1  1.34 (46%) ratio 44  ?- PFTs   10/29/2013    FEV1    1.70 (62%) ratio 50% no better after ssaba and DLCO 48%  ?- 09/05/2013  Walked RA x 3 laps @ fast pace @  185 ft each stopped due to end of study, fast pace,sat 89% at end  ?- 01/06/14 gradual worsening on anoro plus increase need for saba  ?- 01/28/2014   > try spiriva respimat/ symbicort 160 2bid > changed to symbicort 160 2bid only on 01/28/14  ?- 02/25/2014   90%   > changed to symbicort 80 2bid since hoarse on 160 and clear on exam  ?  later.  ?- PFT's  01/10/2016  FEV1 1.38 (51 % ) ratio 47  p 1 % improvement from saba p symb 160  prior to study with DLCO  43/41  % corrects to 47  % for alv volume   ?- 02/21/2017  After extensive coaching inhaler device  effectiveness =    90% vs baseline < 25%  ?- 02/21/2017 changed to bevespi 2 bid  ?- 04/19/2017  Demonstrated elipta > try anoro to see if cost less/ works as well  ?- PFT's  05/22/2017  FEV1 1.21  (46 % ) ratio 46  p 1 % improvement from saba p anoro and albuterol 1 h prior to study with DLCO  38 % corrects to 49 % for alv volume   ?- changed to Incruse 06/04/17 due to tremors > resolved  ?- 04/01/2018    try change to bevespi 1-2 bid (one bid if too tremulous from 2)  ?- 10/21/2018 changed to anoro > globus sensation reported 12/17/2018  ?- 12/17/2020  After extensive coaching inhaler device,  effectiveness =    75% with SMI > changed to stiolto due to cough on breztri  Plus prednisone prn  ?- 12/17/2020 added pred as plan D = 20 mg per day until better then 10 mg per day x 5 d and stop ?- 12/17/2020 added flutter valve  ? ? Group D (now reclassified as E) in terms of symptom/risk and laba/lama/ICS  therefore appropriate rx at this point >>>  stiolto plus prn prednisone po seems to be the best option given the irritability of his upper airway.  ? ?Re SABA :  I spent extra time with pt today reviewing appropriate use of albuterol for prn  use on exertion with the following points: ?1) saba is for relief of sob that does not improve by walking a slower pace or resting but rather if the pt does not improve after trying this first. ?2) If the pt is convinced, as many are, that saba helps recover from activity faster then it's easy to tell if this is the case by re-challenging : ie stop, take the inhaler, then p 5 minutes try the exact same activity (intensity of workload) that just caused the symptoms and see if they are substantially diminished or not after saba ?3) if there is an activity that reproducibly causes the symptoms, try the saba 15 min before the activity on alternate days  ? ?If in fact the saba really does help, then fine to continue to use it prn but advised may need to look closer at the maintenance regimen being used to achieve better control of airways disease with exertion.  ? ?  ?

## 2021-06-03 ENCOUNTER — Encounter: Payer: Self-pay | Admitting: General Practice

## 2021-06-03 NOTE — Progress Notes (Signed)
[facility] Note ? ?Left voicemail requesting phone appointment change to 4pm today and encouraging return call. ? ? ?Chaplain Lorrin Jackson, MDiv, [facility] ?Pager [phone] ?Voicemail [phone]  ?

## 2021-06-06 ENCOUNTER — Encounter: Payer: Self-pay | Admitting: General Practice

## 2021-06-06 NOTE — Progress Notes (Signed)
[facility] Note ? ?Reached Elijah Marshall by phone to reschedule phone appointment. He was very Patent attorney. We plan to talk tomorrow at 1pm in more detail. ? ? ?Chaplain Lorrin Jackson, MDiv, [facility] ?Pager [phone] ?Voicemail [phone]  ?

## 2021-06-07 ENCOUNTER — Encounter: Payer: Self-pay | Admitting: General Practice

## 2021-06-07 NOTE — Progress Notes (Signed)
[facility] Note ? ?Followed up with Elijah Marshall via 1-hour phone appointment as planned, providing opportunity for him to share and process updates on how he and his wife are coping with their grief, he in the loss of a brother and sister, and she in the very recent loss of a sister. 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 ? ?As Elijah Marshall prepares for Mother's Day, he anticipates that his family's grief may be particularly heavy. He plans to speak with family members about whether they plan any formal observance/gathering. Encouraged thinking through self-care possibilities in advance, as well. ? ?We plan to follow up by phone after Mother's Day and may schedule an in-person appointment thereafter. ? ?Chaplain Lorrin Jackson, MDiv, [facility] ?Pager [phone] ?Voicemail [phone]  ?

## 2021-06-20 ENCOUNTER — Encounter: Payer: Self-pay | Admitting: General Practice

## 2021-06-20 NOTE — Progress Notes (Signed)
[facility] Note ? ?Attempted pastoral follow-up by phone because Mother's Day was feeling like a heavy, loaded holiday for Mr Depuy. Left voicemail of care and support, encouraging return call. ? ? ?Chaplain Lorrin Jackson, MDiv, [facility] ?Pager [phone] ?Voicemail [phone]  ?

## 2021-08-23 ENCOUNTER — Telehealth: Payer: Self-pay | Admitting: Internal Medicine

## 2021-08-23 MED ORDER — AZELASTINE HCL 0.1 % NA SOLN: 1.0000 | Freq: Two times a day (BID) | NASAL | 11 refills | Status: DC

## 2021-08-23 MED ORDER — FLUTICASONE PROPIONATE 50 MCG/ACT NA SUSP: 1.0000 | Freq: Every day | NASAL | 11 refills | Status: DC

## 2021-08-23 NOTE — Telephone Encounter (Signed)
Patient is requesting fluticasone combination for sinus issues he has had for about 2 weeks. Uses [facility] on [address]. Call abck [phone].

## 2021-08-23 NOTE — Telephone Encounter (Signed)
Spoke with the pt and notified of response per MW. Pt verbalized understanding. I have sent rxs for both nasal sprays. Nothing further needed.

## 2021-08-23 NOTE — Telephone Encounter (Signed)
Called and spoke with patient. He was requesting a prescription for a "fluticasone" combination nasal spray to use for his sinuses. He stated that he had used it before. I reviewed his current medication list and did not any nasal spray. I asked him if it was Dymista but he stated he does not believe it was Dymista.   I reviewed his chart and I could only find regular Flonase in his history.   [facility] [address].   Dr. Melvyn Novas, please advise if you are ok with [place] sending in the Flonase. Thanks!

## 2021-08-23 NOTE — Telephone Encounter (Signed)
I had referred him to ENT as of last ov so they probably rec dymista or the generic equivalent that he's referring to with fluticasone and astelin generic or as two different prescriptions, whichever one his insurance will cover.

## 2021-08-25 ENCOUNTER — Ambulatory Visit: Payer: Medicare Other | Admitting: Cardiology

## 2021-08-26 ENCOUNTER — Telehealth: Payer: Self-pay | Admitting: Internal Medicine

## 2021-08-26 NOTE — Telephone Encounter (Signed)
Called and spoke with patient. Patient verbalized understanding. Nothing further needed.  

## 2021-08-26 NOTE — Telephone Encounter (Signed)
No I don't  - could see ent again if prn

## 2021-08-26 NOTE — Telephone Encounter (Signed)
Called patient and he states the Astelin nasal spray and the Fluticasone nasal spray is too expensive for him. He wants to know if Dr Melvyn Novas has any other recommendations?  Please advise

## 2021-08-29 ENCOUNTER — Ambulatory Visit: Payer: Medicare Other | Admitting: Cardiology

## 2021-09-05 ENCOUNTER — Telehealth: Payer: Self-pay | Admitting: Internal Medicine

## 2021-09-05 MED ORDER — STIOLTO RESPIMAT 2.5-2.5 MCG/ACT IN AERS: 2.0000 | INHALATION_SPRAY | Freq: Every day | RESPIRATORY_TRACT | 5 refills | Status: DC

## 2021-09-05 NOTE — Telephone Encounter (Signed)
Called patient and went over medication he needed refilled. And I advised him at his follow up with Dr Melvyn Novas we can do another walk test. Nothing further needed

## 2021-09-15 ENCOUNTER — Telehealth: Payer: Self-pay | Admitting: Internal Medicine

## 2021-09-16 ENCOUNTER — Encounter: Payer: Self-pay | Admitting: *Deleted

## 2021-09-16 NOTE — Telephone Encounter (Signed)
Letter done and pt aware. He wants to pick up and I have it ready for him up front. Nothing further needed.

## 2021-09-16 NOTE — Telephone Encounter (Signed)
Pt is requesting letter to excuse him from jury duty on 10/19/21. Juror number is [id number]. Dr. Melvyn Novas please advise.

## 2021-09-16 NOTE — Telephone Encounter (Signed)
Ok with me 

## 2021-09-28 ENCOUNTER — Ambulatory Visit: Payer: Medicare Other | Admitting: Internal Medicine

## 2021-10-05 ENCOUNTER — Ambulatory Visit: Payer: Medicare Other | Admitting: Cardiology

## 2021-10-05 ENCOUNTER — Encounter: Payer: Self-pay | Admitting: Cardiology

## 2021-10-05 VITALS — BP 140/90 | HR 93 | Temp 98.0°F | Resp 16 | Ht 70.0 in | Wt 172.0 lb

## 2021-10-05 DIAGNOSIS — I10 Essential (primary) hypertension: Secondary | ICD-10-CM

## 2021-10-05 DIAGNOSIS — I484 Atypical atrial flutter: Secondary | ICD-10-CM

## 2021-10-05 NOTE — Progress Notes (Signed)
  Patient referred by Shaw, Eva N, MD for tachycardia  Subjective:   Elijah Marshall, male    DOB: 05/03/1950, 71 y.o.   MRN: [id number]   Chief Complaint  Patient presents with   Atrial Flutter   Follow-up    6 month    HPI   71 y/o African American male with hypertension, paroxysmal atrial flutter, COPD GOLD III, former smoker, h/o non-small cell carcinoma of right lung s/p lobectomy  Patient is doing well.  He has not had any recent palpitation symptoms.     Current Outpatient Medications:    albuterol (VENTOLIN HFA) 108 (90 Base) MCG/ACT inhaler, Inhale 2 puffs into the lungs every 6 (six) hours as needed for wheezing or shortness of breath., Disp: , Rfl:    atorvastatin (LIPITOR) 40 MG tablet, TAKE 1 TABLET BY MOUTH EVERY DAY (Patient taking differently: Take 40 mg by mouth daily.), Disp: 90 tablet, Rfl: 0   azelastine (ASTELIN) 0.1 % nasal spray, Place 1 spray into both nostrils 2 (two) times daily. Use in each nostril as directed, Disp: 30 mL, Rfl: 11   baclofen (LIORESAL) 10 MG tablet, Take 10 mg by mouth daily as needed for muscle spasms., Disp: , Rfl:    Brimonidine Tartrate (LUMIFY) 0.025 % SOLN, Apply 1 drop to eye daily., Disp: , Rfl:    famotidine (PEPCID) 40 MG tablet, Take 1 tablet (40 mg total) by mouth 2 (two) times daily. (Patient taking differently: Take 40 mg by mouth daily as needed for heartburn.), Disp: 180 tablet, Rfl: 3   fluticasone (FLONASE) 50 MCG/ACT nasal spray, Place 1 spray into both nostrils daily., Disp: 16 g, Rfl: 11   hydrochlorothiazide (HYDRODIURIL) 25 MG tablet, Take 25 mg by mouth daily., Disp: , Rfl:    HYDROcodone-acetaminophen (NORCO/VICODIN) 5-325 MG tablet, Take 1-2 tablets by mouth every 4 (four) hours as needed for moderate pain or severe pain., Disp: 40 tablet, Rfl: 0   lansoprazole (PREVACID) 30 MG capsule, TAKE 1 CAPSULE (30 MG TOTAL) BY MOUTH 2 (TWO) TIMES DAILY. (Patient taking differently: Take 30 mg by mouth 2 (two) times daily  before a meal.), Disp: 180 capsule, Rfl: 1   levocetirizine (XYZAL) 5 MG tablet, Take 5 mg by mouth every evening., Disp: , Rfl:    metoprolol succinate (TOPROL-XL) 50 MG 24 hr tablet, Take 1 tablet (50 mg total) by mouth daily. Take with or immediately following a meal., Disp: 90 tablet, Rfl: 3   Multiple Vitamin (MULTIVITAMIN ADULT PO), Take by mouth., Disp: , Rfl:    OXYGEN, Inhale 2 L into the lungs daily as needed (with exertion only)., Disp: , Rfl:    polyethylene glycol (MIRALAX / GLYCOLAX) 17 g packet, Take 17 g by mouth daily., Disp: , Rfl:    sildenafil (VIAGRA) 100 MG tablet, Take 100 mg by mouth daily as needed for erectile dysfunction., Disp: , Rfl:    Tiotropium Bromide-Olodaterol (STIOLTO RESPIMAT) 2.5-2.5 MCG/ACT AERS, Inhale 2 puffs into the lungs daily., Disp: 4 g, Rfl: 5  Cardiovascular and other pertinent studies:  EKG 02/28/2021: Sinus rhythm 77 bpm Normal EKG  Echocardiogram 03/25/2020:  Left ventricle cavity is normal in size and wall thickness. Normal global  wall motion. Normal LV systolic function with EF 65%. Normal diastolic  filling pattern.  No significant valvular abnormality.  normal right atrial pressure.   Mobile cardiac telemetry 6 days 03/17/2020 - 03/24/2020: Dominant rhythm: Sinus. HR 54-110 bpm. Avg HR 77 bpm. <1% isolated SVE, couplet <1%     Patient referred by Shaw, Eva N, MD for tachycardia  Subjective:   Elijah Marshall, male    DOB: 05/03/1950, 71 y.o.   MRN: [id number]   Chief Complaint  Patient presents with   Atrial Flutter   Follow-up    6 month    HPI   71 y/o African American male with hypertension, paroxysmal atrial flutter, COPD GOLD III, former smoker, h/o non-small cell carcinoma of right lung s/p lobectomy  Patient is doing well.  He has not had any recent palpitation symptoms.     Current Outpatient Medications:    albuterol (VENTOLIN HFA) 108 (90 Base) MCG/ACT inhaler, Inhale 2 puffs into the lungs every 6 (six) hours as needed for wheezing or shortness of breath., Disp: , Rfl:    atorvastatin (LIPITOR) 40 MG tablet, TAKE 1 TABLET BY MOUTH EVERY DAY (Patient taking differently: Take 40 mg by mouth daily.), Disp: 90 tablet, Rfl: 0   azelastine (ASTELIN) 0.1 % nasal spray, Place 1 spray into both nostrils 2 (two) times daily. Use in each nostril as directed, Disp: 30 mL, Rfl: 11   baclofen (LIORESAL) 10 MG tablet, Take 10 mg by mouth daily as needed for muscle spasms., Disp: , Rfl:    Brimonidine Tartrate (LUMIFY) 0.025 % SOLN, Apply 1 drop to eye daily., Disp: , Rfl:    famotidine (PEPCID) 40 MG tablet, Take 1 tablet (40 mg total) by mouth 2 (two) times daily. (Patient taking differently: Take 40 mg by mouth daily as needed for heartburn.), Disp: 180 tablet, Rfl: 3   fluticasone (FLONASE) 50 MCG/ACT nasal spray, Place 1 spray into both nostrils daily., Disp: 16 g, Rfl: 11   hydrochlorothiazide (HYDRODIURIL) 25 MG tablet, Take 25 mg by mouth daily., Disp: , Rfl:    HYDROcodone-acetaminophen (NORCO/VICODIN) 5-325 MG tablet, Take 1-2 tablets by mouth every 4 (four) hours as needed for moderate pain or severe pain., Disp: 40 tablet, Rfl: 0   lansoprazole (PREVACID) 30 MG capsule, TAKE 1 CAPSULE (30 MG TOTAL) BY MOUTH 2 (TWO) TIMES DAILY. (Patient taking differently: Take 30 mg by mouth 2 (two) times daily  before a meal.), Disp: 180 capsule, Rfl: 1   levocetirizine (XYZAL) 5 MG tablet, Take 5 mg by mouth every evening., Disp: , Rfl:    metoprolol succinate (TOPROL-XL) 50 MG 24 hr tablet, Take 1 tablet (50 mg total) by mouth daily. Take with or immediately following a meal., Disp: 90 tablet, Rfl: 3   Multiple Vitamin (MULTIVITAMIN ADULT PO), Take by mouth., Disp: , Rfl:    OXYGEN, Inhale 2 L into the lungs daily as needed (with exertion only)., Disp: , Rfl:    polyethylene glycol (MIRALAX / GLYCOLAX) 17 g packet, Take 17 g by mouth daily., Disp: , Rfl:    sildenafil (VIAGRA) 100 MG tablet, Take 100 mg by mouth daily as needed for erectile dysfunction., Disp: , Rfl:    Tiotropium Bromide-Olodaterol (STIOLTO RESPIMAT) 2.5-2.5 MCG/ACT AERS, Inhale 2 puffs into the lungs daily., Disp: 4 g, Rfl: 5  Cardiovascular and other pertinent studies:  EKG 02/28/2021: Sinus rhythm 77 bpm Normal EKG  Echocardiogram 03/25/2020:  Left ventricle cavity is normal in size and wall thickness. Normal global  wall motion. Normal LV systolic function with EF 65%. Normal diastolic  filling pattern.  No significant valvular abnormality.  normal right atrial pressure.   Mobile cardiac telemetry 6 days 03/17/2020 - 03/24/2020: Dominant rhythm: Sinus. HR 54-110 bpm. Avg HR 77 bpm. <1% isolated SVE, couplet <1%

## 2021-11-29 ENCOUNTER — Ambulatory Visit: Payer: Medicare Other | Admitting: Internal Medicine

## 2021-12-06 ENCOUNTER — Ambulatory Visit: Payer: Medicare Other | Admitting: Internal Medicine

## 2021-12-06 ENCOUNTER — Encounter: Payer: Self-pay | Admitting: Internal Medicine

## 2021-12-06 VITALS — BP 126/76 | HR 85 | Temp 97.8°F | Ht 69.0 in | Wt 176.0 lb

## 2021-12-06 DIAGNOSIS — Z23 Encounter for immunization: Secondary | ICD-10-CM | POA: Diagnosis not present

## 2021-12-06 DIAGNOSIS — J9611 Chronic respiratory failure with hypoxia: Secondary | ICD-10-CM

## 2021-12-06 DIAGNOSIS — J449 Chronic obstructive pulmonary disease, unspecified: Secondary | ICD-10-CM | POA: Diagnosis not present

## 2021-12-06 DIAGNOSIS — J310 Chronic rhinitis: Secondary | ICD-10-CM | POA: Diagnosis not present

## 2021-12-06 NOTE — Patient Instructions (Signed)
No change in medication    My office will be contacting you by phone for referral to ENT   - if you don't hear back from my office within one week please call us back or notify us thru MyChart and we'll address it right away.   Please schedule a follow up visit in 6 months but call sooner if needed

## 2021-12-06 NOTE — Assessment & Plan Note (Signed)
Quit smoking 2013 - Spirometry  03/01/12  FEV1  1.63 - Spirometry   09/05/13 FEV1  1.34 (46%) ratio 44  - PFTs   10/29/2013    FEV1    1.70 (62%) ratio 50% no better after ssaba and DLCO 48%  - 09/05/2013  Walked RA x 3 laps @ fast pace @  185 ft each stopped due to end of study, fast pace,sat 89% at end  - 01/06/14 gradual worsening on anoro plus increase need for saba  - 01/28/2014   > try spiriva respimat/ symbicort 160 2bid > changed to symbicort 160 2bid only on 01/28/14  - 02/25/2014   90%   > changed to symbicort 80 2bid since hoarse on 160 and clear on exam    later.  - PFT's  01/10/2016  FEV1 1.38 (51 % ) ratio 47  p 1 % improvement from saba p symb 160  prior to study with DLCO  43/41  % corrects to 47  % for alv volume   - 02/21/2017  After extensive coaching inhaler device  effectiveness =    90% vs baseline < 25%  - 02/21/2017 changed to bevespi 2 bid  - 04/19/2017  Demonstrated elipta > try anoro to see if cost less/ works as well  - PFT's  05/22/2017  FEV1 1.21  (46 % ) ratio 46  p 1 % improvement from saba p anoro and albuterol 1 h prior to study with DLCO  38 % corrects to 49 % for alv volume   - changed to Incruse 06/04/17 due to tremors > resolved  - 04/01/2018    try change to bevespi 1-2 bid (one bid if too tremulous from 2)  - 10/21/2018 changed to anoro > globus sensation reported 12/17/2018  - 12/17/2020  After extensive coaching inhaler device,  effectiveness =    75% with SMI > changed to stiolto due to cough on breztri  Plus prednisone prn  - 12/17/2020 added pred as plan D = 20 mg per day until better then 10 mg per day x 5 d and stop - 12/17/2020 added flutter valve     Group D (now reclassified as E) in terms of symptom/risk and laba/lama/ICS  therefore appropriate rx at this point >>>  stiolto plus prn pred x short course only

## 2021-12-06 NOTE — Assessment & Plan Note (Signed)
Sinus ct 10/22/13  : Normal paranasal sinuses. - rechallenge with singulair 10/29/2013 > d/c 01/28/2014 as not better  - repeat sinus CT 04/08/15 > neg  - Allergy profile 05/16/2016 >  Eos 0. 1/  IgE  14 neg RAST - repeat sinus CT  01/14/21 nl   - referred to ENT 05/30/2021 and again 12/06/2021   Refractory to flonase/astelin > ent eval next         Each maintenance medication was reviewed in detail including emphasizing most importantly the difference between maintenance and prns and under what circumstances the prns are to be triggered using an action plan format where appropriate.  Total time for H and P, chart review, counseling, reviewing hfa/neb/nasal  device(s) and generating customized AVS unique to this office visit / same day charting = 21 min

## 2021-12-06 NOTE — Assessment & Plan Note (Signed)
2017 SATURATION QUALIFICATIONS:  11/19/2015  Patient Saturations on Room Air at Rest = 96% Patient Saturations on Room Air while Ambulating = 86% Patient Saturations on 2  Liters of oxygen while Ambulating = 96% - 05/16/2016 :   Saturations on Room Air at Rest = 88%--- increased to 99% on 2lpm continuous with activity   -  07/28/2019   Walked RA  2 laps @ approx 276ft each @ avg pace  stopped due to  sob with sats still 91%  - 09/28/2020   Walked on RA x  2  lap(s) =  approx 500 @ avg pace,  with lowest 02 sats 91% and no sob  Advised goal > 90% sats

## 2021-12-06 NOTE — Progress Notes (Signed)
Subjective:   Patient ID: Elijah Marshall, male    DOB: 09/03/1950  MRN: [id number]   Brief patient profile:  71  yobm quit smoking 2013  and able work out at [employer] and did fine until hurt back 2011 then 2 back surgeries and knee surgery  and more noticeable doe  since then but also limited by back and s/p sup segmentectomy RLL NSC lung ca Ia 05/2015  referred to pulmonary clinic 09/05/2013 for ? Copd  With GOLD II criteria 10/29/13 and confirmed 01/10/2016     History of Present Illness  09/05/2013 1st [facility] office visit/ [name]  Chief Complaint  Patient presents with   Pulmonary Consult    Referred per Dr. Norberto Sorenson.  Pt c/o SOB for the past 3 yrs. He states that he was dxed with COPD back in 2012 or 2013.  He states that that he sometimes has trouble with breathing when walking up stairs and lifting things.    rx spiriva first, alb, dulera not as effective  No problem at rest or supine Assoc nasal congestion much better p shot (?depomedrol) and bad again x sev years corresponds to worse doe  Ex = walking one mile slower pace than wife,trouble with hills, worse in heat  Bad hb even on dexilant  Min dry cough  rec Stop spiriva  Start anoro two puffs off one click each am Zantac 150 mg one at  Bedtime Prednisone 10 mg take  4 each am x 2 days,   2 each am x 2 days,  1 each am x 2 days and stop  GERD diet     11/12/2020  f/u ov/[name] re: GOLD 3    maint on breztri with baseline saba = rarely   Chief Complaint  Patient presents with   Follow-up    SOB has slightly improved, coughing has worsened with phlegm   Dyspnea:  not walking much this week, flared 9/26 and ended up in ER 9/30 Cough: mucus is yellowish finished up zpak  11/10/20  Sleeping: kept up by cough, sense of throat congestion, flat bed with 3-4 pillows  SABA use: not using  02: only with ex  Covid status:   vax x 3  Rec Prevacid 30 mg Take 30- 60 min before your first and last meals of the day  For cough >   mucinex dm 1200 mg every 12 hours  as needed  Flonase is twice daily each nostril (point to opposite ear)  Prednisone 10 mg take  4 each am x 2 days,   2 each am x 2 days,  1 each am x 2 days and stop  Plan A = Automatic = Always=   Breztri Take 2 puffs first thing in am and then another 2 puffs about 12 hours later.  Work on inhaler technique:  Plan B = Backup (to supplement plan A, not to replace it) Only use your albuterol inhaler as a rescue medication  GERD diet   12/08/20 called in pred x 6 days/ zpak   12/17/2020  Acute ov/[name] re: GOLD 3 "copd flare" dates all the way to Sept 2022   maint on breztri 2bid /  not taking ppi ac  Chief Complaint  Patient presents with   Follow-up    Pt states f/u for cough he have seen some improvement this morning  Dyspnea:  ? Some better / doing lowe[employer] shopping ok pushing cart  Cough: severe almost to point of  choking despite pred/ zpak Sleeping: bed is flat / 3 pillows worse first thing in am  SABA use: a few times since d/c from hospital / no neb  02: 2.5 lpm prn  Covid status:   covd x 2 / never infected  Rec Flonase ( nasal steroids) have no immediate benefit in terms of improving symptoms  Stop breztri  start stiolto 2 puffs each  and prednisone 10 mg 2 each am until better then 1 daily for 5 days and stop  For cough mucinex dm  every 12 hours and use the flutter valve as much as possible  We will call you to set up sinus CT > nl Be sure  Prevacid is 30 mg Take 30- 60 min before your first and last meals of the day and take pepcid 20 mg after supper and bedtime  GERD diet reviewed, bed blocks rec    EGD  05/19/21   Dr Elnoria Howard    05/30/2021  f/u ov/[name] re: GOLD 3  copd   maint on stiolto   Chief Complaint  Patient presents with   Acute Visit    Increased nasal congestion x 2 wks. He has had occ cough with grey sputum. He also has noticed some wheezing. He is using his albuterol inhaler about 2 x per wk.    Dyspnea:  lowe's shopping is ok /  last prednisone x one month prior to OV   Cough: sporadic/ occ slt dark mucus but mostly not productive nor is throat clearing Sleeping: flat bed 3 pillows - no noct throat symptoms SABA use: less now. 02: 2.5 lpm prn  Rec Stiolto 2 puffs each  and if worse worse prednisone 10 mg 2 each am until better then 1 daily for 5 days and stop  GERD  diet  I will be referring you for ENT evaluation since are not better on the flonase    12/06/2021  f/u ov/[name] re: GOLD 3    maint on stiolto and last prednisone 11/25/21   Chief Complaint  Patient presents with   Follow-up    Cough with phlem x 2 weeks  Dyspnea:  no change  Cough: white mucus  Sleeping: flat bed, 3 pillows SABA use: no 02: 2.5 lpm prn    Lung cancer screening :  [name]     No obvious day to day or daytime variability or assoc excess/ purulent sputum or mucus plugs or hemoptysis or cp or chest tightness, subjective wheeze or overt sinus or hb symptoms.   Sleeping  without nocturnal  or early am exacerbation  of respiratory  c/o's or need for noct saba. Also denies any obvious fluctuation of symptoms with weather or environmental changes or other aggravating or alleviating factors except as outlined above   No unusual exposure hx or h/o childhood pna/ asthma or knowledge of premature birth.  Current Allergies, Complete Past Medical History, Past Surgical History, Family History, and Social History were reviewed in [employer] record.  ROS  The following are not active complaints unless bolded Hoarseness, sore throat, dysphagia, dental problems, itching, sneezing,  nasal congestion or discharge of excess mucus or purulent secretions, ear ache,   fever, chills, sweats, unintended wt loss or wt gain, classically pleuritic or exertional cp,  orthopnea pnd or arm/hand swelling  or leg swelling, presyncope, palpitations, abdominal pain, anorexia, nausea, vomiting, diarrhea  or change in bowel habits or change  in bladder habits, change in stools or change in urine, dysuria,  hematuria,  rash, arthralgias, visual complaints, headache, numbness, weakness or ataxia or problems with walking or coordination,  change in mood or  memory.        Current Meds  Medication Sig   albuterol (VENTOLIN HFA) 108 (90 Base) MCG/ACT inhaler Inhale 2 puffs into the lungs every 6 (six) hours as needed for wheezing or shortness of breath.   atorvastatin (LIPITOR) 40 MG tablet TAKE 1 TABLET BY MOUTH EVERY DAY (Patient taking differently: Take 40 mg by mouth daily.)   azelastine (ASTELIN) 0.1 % nasal spray Place 1 spray into both nostrils 2 (two) times daily. Use in each nostril as directed   baclofen (LIORESAL) 10 MG tablet Take 10 mg by mouth daily as needed for muscle spasms.   Brimonidine Tartrate (LUMIFY) 0.025 % SOLN Apply 1 drop to eye daily.   famotidine (PEPCID) 40 MG tablet Take 1 tablet (40 mg total) by mouth 2 (two) times daily. (Patient taking differently: Take 40 mg by mouth daily as needed for heartburn.)   fluticasone (FLONASE) 50 MCG/ACT nasal spray Place 1 spray into both nostrils daily.   hydrochlorothiazide (HYDRODIURIL) 25 MG tablet Take 25 mg by mouth daily.   HYDROcodone-acetaminophen (NORCO/VICODIN) 5-325 MG tablet Take 1-2 tablets by mouth every 4 (four) hours as needed for moderate pain or severe pain.   lansoprazole (PREVACID) 30 MG capsule TAKE 1 CAPSULE (30 MG TOTAL) BY MOUTH 2 (TWO) TIMES DAILY. (Patient taking differently: Take 30 mg by mouth 2 (two) times daily before a meal.)   levocetirizine (XYZAL) 5 MG tablet Take 5 mg by mouth every evening.   Multiple Vitamin (MULTIVITAMIN ADULT PO) Take by mouth.   OXYGEN Inhale 2 L into the lungs daily as needed (with exertion only).   polyethylene glycol (MIRALAX / GLYCOLAX) 17 g packet Take 17 g by mouth daily.   sildenafil (VIAGRA) 100 MG tablet Take 100 mg by mouth daily as needed for erectile dysfunction.   Tiotropium Bromide-Olodaterol (STIOLTO  RESPIMAT) 2.5-2.5 MCG/ACT AERS Inhale 2 puffs into the lungs daily.   Vitamin D, Ergocalciferol, (DRISDOL) 1.25 MG (50000 UNIT) CAPS capsule Take 50,000 Units by mouth once a week.           Objective:  Physical Exam  Wts  12/06/2021       176  05/30/2021        171 12/17/2020      167  11/12/2020        168  09/28/2020        168  07/28/2019        179   05/20/2019       178   12/17/2018      182  04/01/2018        182  12/28/2017      176  08/27/2017        176  06/22/2017        177  05/22/2017        183  04/19/2017        179      07/31/2016        182  05/16/2016        181  04/28/2016        185  01/10/2016        179    11/19/15 182 lb (82.6 kg)  11/11/15 180 lb (81.6 kg)  11/04/15 181 lb 3.5 oz (82.2 kg)    Vital signs reviewed  12/06/2021  - Note at rest 02  sats  96% on RA   General appearance:    amb bm nad / mod hoarseness  HEENT :  Oropharynx  clear  Nasal turbinates mod edema with minimal mucoid secretions   NECK :  without JVD/Nodes/TM/ nl carotid upstrokes bilaterally   LUNGS: no acc muscle use,  Mod barrel  contour chest wall with bilateral  Distant bs s audible wheeze and  without cough on insp or exp maneuvers and mod  Hyperresonant  to  percussion bilaterally     CV:  RRR  no s3 or murmur or increase in P2, and no edema   ABD:  soft and nontender with pos mid insp Hoover's  in the supine position. No bruits or organomegaly appreciated, bowel sounds nl  MS:   Ext warm without deformities or   obvious joint restrictions , calf tenderness, cyanosis or clubbing  SKIN: warm and dry without lesions    NEURO:  alert, approp, nl sensorium with  no motor or cerebellar deficits apparent.

## 2021-12-18 ENCOUNTER — Other Ambulatory Visit: Payer: Self-pay | Admitting: Internal Medicine

## 2021-12-18 DIAGNOSIS — J310 Chronic rhinitis: Secondary | ICD-10-CM

## 2021-12-19 ENCOUNTER — Encounter (HOSPITAL_COMMUNITY): Payer: Self-pay

## 2021-12-19 ENCOUNTER — Ambulatory Visit (HOSPITAL_COMMUNITY)
Admission: RE | Admit: 2021-12-19 | Discharge: 2021-12-19 | Disposition: A | Discharge status: Home / Self Care | Payer: Medicare Other | Source: Ambulatory Visit | Attending: Internal Medicine | Admitting: Internal Medicine

## 2021-12-19 ENCOUNTER — Inpatient Hospital Stay: Payer: Medicare Other | Attending: Internal Medicine

## 2021-12-19 DIAGNOSIS — C3490 Malignant neoplasm of unspecified part of unspecified bronchus or lung: Secondary | ICD-10-CM | POA: Diagnosis present

## 2021-12-19 DIAGNOSIS — Z85118 Personal history of other malignant neoplasm of bronchus and lung: Secondary | ICD-10-CM | POA: Insufficient documentation

## 2021-12-19 LAB — CBC WITH DIFFERENTIAL (CANCER CENTER ONLY)
Abs Immature Granulocytes: 0.02 10*3/uL (ref 0.00–0.07)
Basophils Absolute: 0.1 10*3/uL (ref 0.0–0.1)
Basophils Relative: 1 %
Eosinophils Absolute: 0.2 10*3/uL (ref 0.0–0.5)
Eosinophils Relative: 2 %
HCT: 51.2 % (ref 39.0–52.0)
Hemoglobin: 17.9 g/dL — ABNORMAL HIGH (ref 13.0–17.0)
Immature Granulocytes: 0 %
Lymphocytes Relative: 28 %
Lymphs Abs: 2.7 10*3/uL (ref 0.7–4.0)
MCH: 30 pg (ref 26.0–34.0)
MCHC: 35 g/dL (ref 30.0–36.0)
MCV: 85.8 fL (ref 80.0–100.0)
Monocytes Absolute: 0.9 10*3/uL (ref 0.1–1.0)
Monocytes Relative: 9 %
Neutro Abs: 5.8 10*3/uL (ref 1.7–7.7)
Neutrophils Relative %: 60 %
Platelet Count: 232 10*3/uL (ref 150–400)
RBC: 5.97 MIL/uL — ABNORMAL HIGH (ref 4.22–5.81)
RDW: 13 % (ref 11.5–15.5)
WBC Count: 9.6 10*3/uL (ref 4.0–10.5)
nRBC: 0 % (ref 0.0–0.2)

## 2021-12-19 LAB — CMP (CANCER CENTER ONLY)
ALT: 26 U/L (ref 0–44)
AST: 22 U/L (ref 15–41)
Albumin: 4.6 g/dL (ref 3.5–5.0)
Alkaline Phosphatase: 89 U/L (ref 38–126)
Anion gap: 7 (ref 5–15)
BUN: 8 mg/dL (ref 8–23)
CO2: 33 mmol/L — ABNORMAL HIGH (ref 22–32)
Calcium: 9.7 mg/dL (ref 8.9–10.3)
Chloride: 99 mmol/L (ref 98–111)
Creatinine: 1.13 mg/dL (ref 0.61–1.24)
GFR, Estimated: >60 mL/min (ref >60)
Glucose, Bld: 96 mg/dL (ref 70–99)
Potassium: 3.2 mmol/L — ABNORMAL LOW (ref 3.5–5.1)
Sodium: 139 mmol/L (ref 135–145)
Total Bilirubin: 0.8 mg/dL (ref 0.3–1.2)
Total Protein: 7.7 g/dL (ref 6.5–8.1)

## 2021-12-19 MED ORDER — IOHEXOL 300 MG/ML  SOLN
75.0000 mL | Freq: Once | INTRAMUSCULAR | Status: AC | PRN
  Administered 2021-12-19: 75 mL via INTRAVENOUS

## 2021-12-19 MED ORDER — SODIUM CHLORIDE (PF) 0.9 % IJ SOLN
INTRAMUSCULAR | Status: AC
  Filled 2021-12-19: qty 50

## 2021-12-22 ENCOUNTER — Inpatient Hospital Stay (HOSPITAL_BASED_OUTPATIENT_CLINIC_OR_DEPARTMENT_OTHER): Payer: Medicare Other | Admitting: Internal Medicine

## 2021-12-22 VITALS — BP 146/92 | HR 87 | Temp 98.4°F | Resp 16 | Wt 174.4 lb

## 2021-12-22 DIAGNOSIS — C3491 Malignant neoplasm of unspecified part of right bronchus or lung: Secondary | ICD-10-CM

## 2021-12-22 DIAGNOSIS — Z85118 Personal history of other malignant neoplasm of bronchus and lung: Secondary | ICD-10-CM | POA: Diagnosis present

## 2021-12-22 NOTE — Progress Notes (Signed)
[place] [phone]   [phone]  OFFICE PROGRESS NOTE  [name], MD [place] [address]  DIAGNOSIS: Stage IA (T1a, N0, M0) non-small cell lung cancer, moderately differentiated squamous cell carcinoma presented with right lower lobe lung nodule.   PRIOR THERAPY: Status post wedge resection of the right lower lobe in April 2017 under the care of Dr. Roxan Hockey.  CURRENT THERAPY: Observation.  INTERVAL HISTORY: Elijah Marshall 71 y.o. male returns to the clinic today for follow-up visit.  The patient is feeling fine today with no concerning complaints except for mild shortness of breath with exertion.  He has no chest pain, cough or hemoptysis.  He has no nausea, vomiting, diarrhea or constipation.  He has no headache or visual changes.  He is here today for evaluation with repeat CT scan of the chest for restaging of his disease.   MEDICAL HISTORY: Past Medical History:  Diagnosis Date   Allergy    Arthritis    BPH with obstruction/lower urinary tract symptoms 2016   Nocturia   Cancer ([place])    unsure at this time   Cancer of lower lobe of right lung ([place])    COPD (chronic obstructive pulmonary disease) ([place])    per patient's health survey - he put a ?   Depression    Dysrhythmia    Erectile dysfunction due to arterial insufficiency    GERD (gastroesophageal reflux disease)    Hyperlipidemia    Hypertension    Non-small cell carcinoma of lung, right ([place]) 06/24/2015   Pneumonia    Shortness of breath    Ulcer     ALLERGIES:  is allergic to aspirin, morphine, and morphine and related.  MEDICATIONS:  Current Outpatient Medications  Medication Sig Dispense Refill   albuterol (VENTOLIN HFA) 108 (90 Base) MCG/ACT inhaler Inhale 2 puffs into the lungs every 6 (six) hours as needed for wheezing or shortness of breath.     atorvastatin (LIPITOR) 40 MG tablet TAKE 1 TABLET BY MOUTH EVERY DAY (Patient taking  differently: Take 40 mg by mouth daily.) 90 tablet 0   azelastine (ASTELIN) 0.1 % nasal spray Place 1 spray into both nostrils 2 (two) times daily. Use in each nostril as directed 30 mL 11   baclofen (LIORESAL) 10 MG tablet Take 10 mg by mouth daily as needed for muscle spasms.     Brimonidine Tartrate (LUMIFY) 0.025 % SOLN Apply 1 drop to eye daily.     famotidine (PEPCID) 40 MG tablet Take 1 tablet (40 mg total) by mouth 2 (two) times daily. (Patient taking differently: Take 40 mg by mouth daily as needed for heartburn.) 180 tablet 3   fluticasone (FLONASE) 50 MCG/ACT nasal spray Place 1 spray into both nostrils daily. 16 g 11   hydrochlorothiazide (HYDRODIURIL) 25 MG tablet Take 25 mg by mouth daily.     HYDROcodone-acetaminophen (NORCO/VICODIN) 5-325 MG tablet Take 1-2 tablets by mouth every 4 (four) hours as needed for moderate pain or severe pain. 40 tablet 0   lansoprazole (PREVACID) 30 MG capsule TAKE 1 CAPSULE (30 MG TOTAL) BY MOUTH 2 (TWO) TIMES DAILY. (Patient taking differently: Take 30 mg by mouth 2 (two) times daily before a meal.) 180 capsule 1   levocetirizine (XYZAL) 5 MG tablet Take 5 mg by mouth every evening.     metoprolol succinate (TOPROL-XL) 50 MG 24 hr tablet Take 1 tablet (50 mg total) by mouth daily.  Take with or immediately following a meal. 90 tablet 3   Multiple Vitamin (MULTIVITAMIN ADULT PO) Take by mouth.     OXYGEN Inhale 2 L into the lungs daily as needed (with exertion only).     polyethylene glycol (MIRALAX / GLYCOLAX) 17 g packet Take 17 g by mouth daily.     sildenafil (VIAGRA) 100 MG tablet Take 100 mg by mouth daily as needed for erectile dysfunction.     Tiotropium Bromide-Olodaterol (STIOLTO RESPIMAT) 2.5-2.5 MCG/ACT AERS Inhale 2 puffs into the lungs daily. 4 g 5   Vitamin D, Ergocalciferol, (DRISDOL) 1.25 MG (50000 UNIT) CAPS capsule Take 50,000 Units by mouth once a week.     No current facility-administered medications for this visit.    SURGICAL  HISTORY:  Past Surgical History:  Procedure Laterality Date   ANTERIOR FUSION LUMBAR SPINE  07/25/2012   ANTERIOR LAT LUMBAR FUSION Left 07/25/2012   Procedure: ANTERIOR LATERAL LUMBAR FUSION 1 LEVEL;  Surgeon: [name], MD;  Location: [facility];  Service: Orthopedics;  Laterality: Left;  Left sided lumbar 3-4 lateral interbody fusion with allograft and instrumentation   BACK SURGERY     BIOPSY  04/16/2020   Procedure: BIOPSY;  Surgeon: Carol Ada, MD;  Location: [facility];  Service: Endoscopy;;   COLONOSCOPY  02/07/2012   polyps rem   ESOPHAGOGASTRODUODENOSCOPY N/A 07/27/2020   Procedure: ESOPHAGOGASTRODUODENOSCOPY (EGD);  Surgeon: Carol Ada, MD;  Location: [facility];  Service: Endoscopy;  Laterality: N/A;   ESOPHAGOGASTRODUODENOSCOPY (EGD) WITH PROPOFOL N/A 04/16/2020   Procedure: ESOPHAGOGASTRODUODENOSCOPY (EGD) WITH PROPOFOL;  Surgeon: Carol Ada, MD;  Location: [facility];  Service: Endoscopy;  Laterality: N/A;   JOINT REPLACEMENT Right 02/07/2008   right knee   NODE DISSECTION Right 05/24/2015   Procedure: NODE DISSECTION;  Surgeon: [name], MD;  Location: [facility];  Service: Thoracic;  Laterality: Right;   SEGMENTECOMY Right 05/24/2015   Procedure: RIGHT LOWER LOBE SUPERIOR SEGMENTECTOMY;  Surgeon: [name], MD;  Location: [facility];  Service: Thoracic;  Laterality: Right;   SPINE SURGERY  02/06/2010   SUBMUCOSAL TATTOO INJECTION  04/16/2020   Procedure: SUBMUCOSAL TATTOO INJECTION;  Surgeon: Carol Ada, MD;  Location: [facility];  Service: Endoscopy;;   UPPER ESOPHAGEAL ENDOSCOPIC ULTRASOUND (EUS) N/A 04/16/2020   Procedure: UPPER ESOPHAGEAL ENDOSCOPIC ULTRASOUND (EUS);  Surgeon: Carol Ada, MD;  Location: [facility];  Service: Endoscopy;  Laterality: N/A;   VIDEO ASSISTED THORACOSCOPY (VATS)/WEDGE RESECTION Right 05/24/2015   Procedure: RIGHT VIDEO ASSISTED THORACOSCOPY (VATS)/ RIGHTR LOWER LOBE WEDGE RESECTION;  Surgeon: [name], MD;  Location: [facility];  Service: Thoracic;  Laterality: Right;    REVIEW OF SYSTEMS:  A comprehensive review of systems was negative except for: Respiratory: positive for dyspnea on exertion   PHYSICAL EXAMINATION: General appearance: alert, cooperative, and no distress Head: Normocephalic, without obvious abnormality, atraumatic Neck: no adenopathy, no JVD, supple, symmetrical, trachea midline, and thyroid not enlarged, symmetric, no tenderness/mass/nodules Lymph nodes: Cervical, supraclavicular, and axillary nodes normal. Resp: clear to auscultation bilaterally Back: symmetric, no curvature. ROM normal. No CVA tenderness. Cardio: regular rate and rhythm, S1, S2 normal, no murmur, click, rub or gallop GI: soft, non-tender; bowel sounds normal; no masses,  no organomegaly Extremities: extremities normal, atraumatic, no cyanosis or edema  ECOG PERFORMANCE STATUS: 1 - Symptomatic but completely ambulatory  Blood pressure (!) 146/92, pulse 87, temperature 98.4 F (36.9 C), temperature source Oral, resp. rate 16, weight 174 lb 6.4 oz (79.1 kg), SpO2 91 %.  LABORATORY DATA: Lab Results  Component Value Date   WBC 9.6 12/19/2021   HGB 17.9 (H) 12/19/2021   HCT 51.2 12/19/2021   MCV 85.8 12/19/2021   PLT 232 12/19/2021      Chemistry      Component Value Date/Time   NA 139 12/19/2021 1135   NA 141 12/25/2016 1010   K 3.2 (L) 12/19/2021 1135   K 3.6 12/25/2016 1010   CL 99 12/19/2021 1135   CO2 33 (H) 12/19/2021 1135   CO2 28 12/25/2016 1010   BUN 8 12/19/2021 1135   BUN 6.1 (L) 12/25/2016 1010   CREATININE 1.13 12/19/2021 1135   CREATININE 1.1 12/25/2016 1010      Component Value Date/Time   CALCIUM 9.7 12/19/2021 1135   CALCIUM 9.6 12/25/2016 1010   ALKPHOS 89 12/19/2021 1135   ALKPHOS 107 12/25/2016 1010   AST 22 12/19/2021 1135   AST 19 12/25/2016 1010   ALT 26 12/19/2021 1135   ALT 15 12/25/2016 1010   BILITOT 0.8 12/19/2021 1135   BILITOT 0.56  12/25/2016 1010       RADIOGRAPHIC STUDIES: CT Chest W Contrast  Result Date: 12/20/2021 CLINICAL DATA:  Non-small cell lung cancer. * Tracking Code: BO * EXAM: CT CHEST WITH CONTRAST TECHNIQUE: Multidetector CT imaging of the chest was performed during intravenous contrast administration. RADIATION DOSE REDUCTION: This exam was performed according to the departmental dose-optimization program which includes automated exposure control, adjustment of the mA and/or kV according to patient size and/or use of iterative reconstruction technique. CONTRAST:  67mL OMNIPAQUE IOHEXOL 300 MG/ML  SOLN COMPARISON:  None Available. FINDINGS: Cardiovascular: Coronary artery calcification and aortic atherosclerotic calcification. Mediastinum/Nodes: No axillary or supraclavicular adenopathy. No mediastinal or hilar adenopathy. No pericardial fluid. Esophagus normal. Lungs/Pleura: No suspicious pulmonary nodules. Normal pleural. Airways normal. Extensive centrilobular emphysema the upper lobes. Benign pleuroparenchymal scarring at the RIGHT lung base. Upper Abdomen: Limited view of the liver, kidneys, pancreas are unremarkable. Normal adrenal glands. Musculoskeletal: No aggressive osseous lesion. IMPRESSION: 1. No evidence of lung cancer recurrence. 2. Coronary artery calcification and Aortic Atherosclerosis (ICD10-I70.0). Electronically Signed   By: Suzy Bouchard M.D.   On: 12/20/2021 11:52     ASSESSMENT AND PLAN:  This is a very pleasant 71 years old African-American male with a stage IA non-small cell lung cancer status post wedge resection of the right lower lobe in 2017. The patient is currently on observation and he has been doing fine with no concerning complaints. He had repeat CT scan of the chest that showed no evidence for lung cancer recurrence. I recommended for the patient to continue on observation with routine follow-up visit with his primary care physician from now on.  I will see the patient on  as-needed basis in the future. He was advised to call if he has any concerning symptoms. All questions were answered. The patient knows to call the clinic with any problems, questions or concerns. We can certainly see the patient much sooner if necessary.   Disclaimer: This note was dictated with voice recognition software. Similar sounding words can inadvertently be transcribed and may not be corrected upon review.

## 2022-01-04 ENCOUNTER — Telehealth: Payer: Self-pay | Admitting: Internal Medicine

## 2022-01-04 NOTE — Telephone Encounter (Signed)
We currently do not have any samples of Stiolto at the office. Called and spoke with pt letting him know this info and he verbalized understanding. Nothing further needed.

## 2022-01-04 NOTE — Telephone Encounter (Signed)
Patient called to request some samples of his medication for Stiolto-restimat.  He stated that he is the donut whole and cannot afford the full price now.  Please advise and call patient to let him know when he could pick the meds up.  CB# [phone]

## 2022-01-16 NOTE — Telephone Encounter (Signed)
Opened in error

## 2022-03-05 ENCOUNTER — Other Ambulatory Visit: Payer: Self-pay | Admitting: Internal Medicine

## 2022-03-11 ENCOUNTER — Other Ambulatory Visit: Payer: Self-pay | Admitting: Cardiology

## 2022-03-11 DIAGNOSIS — I484 Atypical atrial flutter: Secondary | ICD-10-CM

## 2022-03-15 ENCOUNTER — Other Ambulatory Visit: Payer: Self-pay

## 2022-03-15 ENCOUNTER — Telehealth: Payer: Self-pay | Admitting: Internal Medicine

## 2022-03-15 MED ORDER — AZITHROMYCIN 250 MG PO TABS: 250.0000 mg | ORAL_TABLET | Freq: Every day | ORAL | 0 refills | Status: AC

## 2022-03-15 MED ORDER — PREDNISONE 20 MG PO TABS: 40.0000 mg | ORAL_TABLET | Freq: Every day | ORAL | 0 refills | Status: AC

## 2022-03-15 NOTE — Telephone Encounter (Signed)
Spoke with patient he states as of yesterday he has a productive cough with grayish/clear phlegm, sob, some wheezing, and is now hoarse today. States he has taken mucinex and been using his inhalers. Nothing is helping yet. Dr. Vaughan Browner please advise since Dr. Melvyn Novas is off  Pharmacy [facility] on [address]

## 2022-03-15 NOTE — Telephone Encounter (Signed)
Please call in Z-Pak and prednisone 40 mg a day for 5 days

## 2022-03-15 NOTE — Telephone Encounter (Signed)
Sent in antibiotic and prednisone for patient. He is aware. Nothing further needed.

## 2022-03-21 ENCOUNTER — Telehealth: Payer: Self-pay | Admitting: Internal Medicine

## 2022-03-21 NOTE — Telephone Encounter (Signed)
Spoke with the pt He is still having prod cough and hoarseness after finishing pred and zpack  Denies fevers, aches  I scheduled him for acute visit on 03/23/22  Advised seek emergent care sooner if needed

## 2022-03-23 ENCOUNTER — Encounter: Payer: Self-pay | Admitting: Pulmonary Disease

## 2022-03-23 ENCOUNTER — Ambulatory Visit: Payer: Medicare Other | Admitting: Pulmonary Disease

## 2022-03-23 ENCOUNTER — Ambulatory Visit (INDEPENDENT_AMBULATORY_CARE_PROVIDER_SITE_OTHER): Payer: Medicare Other

## 2022-03-23 VITALS — BP 122/64 | HR 93 | Wt 175.2 lb

## 2022-03-23 DIAGNOSIS — J441 Chronic obstructive pulmonary disease with (acute) exacerbation: Secondary | ICD-10-CM

## 2022-03-23 MED ORDER — AMOXICILLIN-POT CLAVULANATE 875-125 MG PO TABS: 1.0000 | ORAL_TABLET | Freq: Two times a day (BID) | ORAL | 0 refills | Status: AC

## 2022-03-23 MED ORDER — PREDNISONE 10 MG PO TABS: ORAL_TABLET | ORAL | 0 refills | Status: AC

## 2022-03-23 NOTE — Patient Instructions (Addendum)
We will get a chest xray today  Take Augmentin 1 tablet twice a day for 5 days   Take prednisone 40 mg for 5 days then 20 mg for 5 daysthen 10 mg for 5 days  Return to clinic with Dr. Melvyn Novas in 6-8 weeks

## 2022-03-24 NOTE — Progress Notes (Signed)
@Patient  ID: Elijah Marshall, male    DOB: Jan 17, 1951, 72 y.o.   MRN: [id number]  Chief Complaint  Patient presents with   Acute Visit    Pt states he finished a zpak and prednisone. Pt states he is still having a mild productive cough that has thick white mucus. He states sometime sit is hard to cough up. Some horsness noted. No fevers noted.     Referring provider: [name], MD  HPI:   72 y.o. man whom we are seeing for evaluation of ongoing cough and shortness of breath underlying severe COPD.  Most recent several telephone encounters from our clinic including recent prescription for azithromycin and prednisone 40 mg daily for 5 days reviewed.  Most recent pulmonary note from Dr. Melvyn Novas, primary pulmonologist, reviewed.  Patient about 2 weeks ago onset of some worsening cough.  Gradually worsened over time.  Got severe.  Associated with severe shortness of breath.  Mucus production, yellow.  Different than baseline cough.  Given worsening symptoms contacted pulmonary office last week.  Was given prednisone 40 mg for 5 days and azithromycin, Z-Pak.  Did not seem to help much.  Does think things are little better today, he is finished his prednisone the last day of azithromycin.  Mild improvement still persistent symptoms of prickly cough and shortness of breath.    Questionaires / Pulmonary Flowsheets:   ACT:      No data to display          MMRC:     No data to display          Epworth:      No data to display          Tests:   FENO:  No results found for: "NITRICOXIDE"  PFT:    Latest Ref Rng & Units 05/22/2017    2:01 PM 01/10/2016    1:47 PM 10/29/2013   12:05 PM  PFT Results  FVC-Pre L 2.57  2.90  3.43   FVC-Predicted Pre % 74  83  97   FVC-Post L 2.66  2.91  3.39   FVC-Predicted Post % 76  83  96   Pre FEV1/FVC % % 46  47  50   Post FEV1/FCV % % 46  47  51   FEV1-Pre L 1.19  1.36  1.70   FEV1-Predicted Pre % 45  51  62   FEV1-Post L 1.21  1.38   1.71   DLCO uncorrected ml/min/mmHg 10.88  12.41  13.77   DLCO UNC% % 38  43  48   DLCO corrected ml/min/mmHg  11.69    DLCO COR %Predicted %  41    DLVA Predicted % 49  47  53   TLC L 6.83  6.87  5.25   TLC % Predicted % 106  107  82   RV % Predicted % 162  152  62   Personally reviewed and interpreted severe fixed obstruction without significant bronchodilator response, lung volumes consistent with air trapping, DLCO severely reduced  WALK:     09/28/2020   10:49 AM 07/28/2019   10:22 AM 11/09/2015    4:43 PM 08/05/2015    4:46 PM 09/05/2013    3:49 PM  SIX MIN WALK  Supplimental Oxygen during Test? (L/min) No No   No  2 Minute Oxygen Saturation %   93 % 94 %   2 Minute HR   101 94   4 Minute Oxygen Saturation %  86 % 88 %   4 Minute HR   111 88   6 Minute Oxygen Saturation %   84 % 87 %   6 Minute HR   115 87   Tech Comments: walked average pace x 2 laps and did not complain of shortness of breath. HM. average pace/SOB//lmr   fast pace/minimal SOB 3rd lap//lmr    Imaging: Personally reviewed and as per EMR discussion this note No results found.  Lab Results: Personally reviewed CBC    Component Value Date/Time   WBC 9.6 12/19/2021 1135   WBC 12.8 (H) 11/06/2020 0201   RBC 5.97 (H) 12/19/2021 1135   HGB 17.9 (H) 12/19/2021 1135   HGB 18.1 (H) 03/15/2020 1153   HGB 16.2 12/25/2016 1010   HCT 51.2 12/19/2021 1135   HCT 53.2 (H) 03/15/2020 1153   HCT 48.6 12/25/2016 1010   PLT 232 12/19/2021 1135   PLT 280 03/15/2020 1153   MCV 85.8 12/19/2021 1135   MCV 88 03/15/2020 1153   MCV 88.2 12/25/2016 1010   MCH 30.0 12/19/2021 1135   MCHC 35.0 12/19/2021 1135   RDW 13.0 12/19/2021 1135   RDW 12.7 03/15/2020 1153   RDW 13.6 12/25/2016 1010   LYMPHSABS 2.7 12/19/2021 1135   LYMPHSABS 2.1 12/25/2016 1010   MONOABS 0.9 12/19/2021 1135   MONOABS 0.7 12/25/2016 1010   EOSABS 0.2 12/19/2021 1135   EOSABS 0.1 12/25/2016 1010   BASOSABS 0.1 12/19/2021 1135   BASOSABS 0.0  12/25/2016 1010    BMET    Component Value Date/Time   NA 139 12/19/2021 1135   NA 141 12/25/2016 1010   K 3.2 (L) 12/19/2021 1135   K 3.6 12/25/2016 1010   CL 99 12/19/2021 1135   CO2 33 (H) 12/19/2021 1135   CO2 28 12/25/2016 1010   GLUCOSE 96 12/19/2021 1135   GLUCOSE 81 12/25/2016 1010   BUN 8 12/19/2021 1135   BUN 6.1 (L) 12/25/2016 1010   CREATININE 1.13 12/19/2021 1135   CREATININE 1.1 12/25/2016 1010   CALCIUM 9.7 12/19/2021 1135   CALCIUM 9.6 12/25/2016 1010   GFRNONAA >60 12/19/2021 1135   GFRAA >60 12/20/2018 1226    BNP    Component Value Date/Time   BNP 29.1 11/05/2020 1440    ProBNP No results found for: "PROBNP"  Specialty Problems       Pulmonary Problems   COPD GOLD III    Quit smoking 2013 - Spirometry  03/01/12  FEV1  1.63 - Spirometry   09/05/13 FEV1  1.34 (46%) ratio 44  - PFTs   10/29/2013    FEV1    1.70 (62%) ratio 50% no better after ssaba and DLCO 48%  - 09/05/2013  Walked RA x 3 laps @ fast pace @  185 ft each stopped due to end of study, fast pace,sat 89% at end  - 01/06/14 gradual worsening on anoro plus increase need for saba  - 01/28/2014   > try spiriva respimat/ symbicort 160 2bid > changed to symbicort 160 2bid only on 01/28/14  - 02/25/2014   90%   > changed to symbicort 80 2bid since hoarse on 160 and clear on exam    later.  - PFT's  01/10/2016  FEV1 1.38 (51 % ) ratio 47  p 1 % improvement from saba p symb 160  prior to study with DLCO  43/41  % corrects to 47  % for alv volume   - 02/21/2017  After extensive coaching  inhaler device  effectiveness =    90% vs baseline < 25%  - 02/21/2017 changed to bevespi 2 bid  - 04/19/2017  Demonstrated elipta > try anoro to see if cost less/ works as well  - PFT's  05/22/2017  FEV1 1.21  (46 % ) ratio 46  p 1 % improvement from saba p anoro and albuterol 1 h prior to study with DLCO  38 % corrects to 49 % for alv volume   - changed to Incruse 06/04/17 due to tremors > resolved  - 04/01/2018    try  change to bevespi 1-2 bid (one bid if too tremulous from 2)  - 10/21/2018 changed to anoro > globus sensation reported 12/17/2018  - 12/17/2020  After extensive coaching inhaler device,  effectiveness =    75% with SMI > changed to stiolto due to cough on breztri  Plus prednisone prn  - 12/17/2020 added pred as plan D = 20 mg per day until better then 10 mg per day x 5 d and stop - 12/17/2020 added flutter valve                Chronic rhinitis    Sinus ct 10/22/13  : Normal paranasal sinuses. - rechallenge with singulair 10/29/2013 > d/c 01/28/2014 as not better  - repeat sinus CT 04/08/15 > neg  - Allergy profile 05/16/2016 >  Eos 0. 1/  IgE  14 neg RAST - repeat sinus CT  01/14/21 nl   - referred to ENT 05/30/2021 and again 12/06/2021       Chronic infection of sinus   Non-small cell carcinoma of lung, right (HCC)   Chronic respiratory failure with hypoxia ([place])    Dx 2017 SATURATION QUALIFICATIONS:  11/19/2015  Patient Saturations on Room Air at Rest = 96% Patient Saturations on Room Air while Ambulating = 86% Patient Saturations on 2  Liters of oxygen while Ambulating = 96% - 05/16/2016 :   Saturations on Room Air at Rest = 88%--- increased to 99% on 2lpm continuous with activity   -  07/28/2019   Walked RA  2 laps @ approx 247ft each @ avg pace  stopped due to  sob with sats still 91%  - 09/28/2020   Walked on RA x  2  lap(s) =  approx 500 @ avg pace,  with lowest 02 sats 91% and no sob             Cough with hemoptysis    09/07/17- hemoptysis in am x 2 CXR clear Labs stable, INR wnl       Acute exacerbation of chronic obstructive pulmonary disease (COPD) ([place])   Acute respiratory failure with hypoxia (HCC)   Allergic rhinitis   SOB (shortness of breath)    Allergies  Allergen Reactions   Aspirin Nausea And Vomiting   Morphine Itching   Morphine And Related Itching    Immunization History  Administered Date(s) Administered   Fluad Quad(high Dose 65+) 11/07/2018,  01/14/2021, 12/06/2021   Influenza, High Dose Seasonal PF 11/21/2016, 11/16/2017   Influenza,inj,Quad PF,6+ Mos 01/15/2015   Influenza-Unspecified 11/02/2015   PFIZER(Purple Top)SARS-COV-2 Vaccination 03/20/2019   Pneumococcal Conjugate-13 04/17/2017, 09/06/2018   Pneumococcal Polysaccharide-23 07/26/2012   Tdap 09/06/2018   Zoster Recombinat (Shingrix) 09/06/2018, 11/07/2018    Past Medical History:  Diagnosis Date   Allergy    Arthritis    BPH with obstruction/lower urinary tract symptoms 2016   Nocturia   Cancer ([place])    unsure at  this time   Cancer of lower lobe of right lung ([place])    COPD (chronic obstructive pulmonary disease) ([place])    per patient's health survey - he put a ?   Depression    Dysrhythmia    Erectile dysfunction due to arterial insufficiency    GERD (gastroesophageal reflux disease)    Hyperlipidemia    Hypertension    Non-small cell carcinoma of lung, right ([place]) 06/24/2015   Pneumonia    Shortness of breath    Ulcer     Tobacco History: Social History   Tobacco Use  Smoking Status Former   Packs/day: 1.00   Years: 35.00   Total pack years: 35.00   Types: Cigarettes   Quit date: 09/24/2011   Years since quitting: 10.5  Smokeless Tobacco Never   Counseling given: Not Answered   Continue to not smoke  Outpatient Encounter Medications as of 03/23/2022  Medication Sig   albuterol (VENTOLIN HFA) 108 (90 Base) MCG/ACT inhaler Inhale 2 puffs into the lungs every 6 (six) hours as needed for wheezing or shortness of breath.   amoxicillin-clavulanate (AUGMENTIN) 875-125 MG tablet Take 1 tablet by mouth 2 (two) times daily for 5 days.   atorvastatin (LIPITOR) 40 MG tablet TAKE 1 TABLET BY MOUTH EVERY DAY (Patient taking differently: Take 40 mg by mouth daily.)   azelastine (ASTELIN) 0.1 % nasal spray Place 1 spray into both nostrils 2 (two) times daily. Use in each nostril as directed   baclofen (LIORESAL) 10 MG tablet Take 10 mg by mouth daily as  needed for muscle spasms.   Brimonidine Tartrate (LUMIFY) 0.025 % SOLN Apply 1 drop to eye daily.   famotidine (PEPCID) 40 MG tablet Take 1 tablet (40 mg total) by mouth 2 (two) times daily. (Patient taking differently: Take 40 mg by mouth daily as needed for heartburn.)   fluticasone (FLONASE) 50 MCG/ACT nasal spray Place 1 spray into both nostrils daily.   hydrochlorothiazide (HYDRODIURIL) 25 MG tablet Take 25 mg by mouth daily.   HYDROcodone-acetaminophen (NORCO/VICODIN) 5-325 MG tablet Take 1-2 tablets by mouth every 4 (four) hours as needed for moderate pain or severe pain.   lansoprazole (PREVACID) 30 MG capsule TAKE 1 CAPSULE (30 MG TOTAL) BY MOUTH 2 (TWO) TIMES DAILY. (Patient taking differently: Take 30 mg by mouth 2 (two) times daily before a meal.)   levocetirizine (XYZAL) 5 MG tablet Take 5 mg by mouth every evening.   metoprolol succinate (TOPROL-XL) 50 MG 24 hr tablet TAKE 1 TABLET BY MOUTH DAILY. TAKE WITH OR IMMEDIATELY FOLLOWING A MEAL.   Multiple Vitamin (MULTIVITAMIN ADULT PO) Take by mouth.   OXYGEN Inhale 2 L into the lungs daily as needed (with exertion only).   polyethylene glycol (MIRALAX / GLYCOLAX) 17 g packet Take 17 g by mouth daily.   predniSONE (DELTASONE) 10 MG tablet Take 4 tablets (40 mg total) by mouth daily with breakfast for 5 days, THEN 2 tablets (20 mg total) daily with breakfast for 5 days, THEN 1 tablet (10 mg total) daily with breakfast for 5 days.   sildenafil (VIAGRA) 100 MG tablet Take 100 mg by mouth daily as needed for erectile dysfunction.   Tiotropium Bromide-Olodaterol (STIOLTO RESPIMAT) 2.5-2.5 MCG/ACT AERS INHALE 2 PUFFS BY MOUTH INTO THE LUNGS DAILY   Vitamin D, Ergocalciferol, (DRISDOL) 1.25 MG (50000 UNIT) CAPS capsule Take 50,000 Units by mouth once a week.   No facility-administered encounter medications on file as of 03/23/2022.     Review of  Systems  Review of Systems  No chest pain with exertion.  No orthopnea or PND.  Comprehensive  review of systems otherwise negative. Physical Exam  BP 122/64 (BP Location: Left Arm, Patient Position: Sitting, Cuff Size: Normal)   Pulse 93   Wt 175 lb 3.2 oz (79.5 kg)   SpO2 94%   BMI 25.87 kg/m   Wt Readings from Last 5 Encounters:  03/23/22 175 lb 3.2 oz (79.5 kg)  12/22/21 174 lb 6.4 oz (79.1 kg)  12/06/21 176 lb (79.8 kg)  10/05/21 172 lb (78 kg)  05/30/21 171 lb (77.6 kg)    BMI Readings from Last 5 Encounters:  03/23/22 25.87 kg/m  12/22/21 25.75 kg/m  12/06/21 25.99 kg/m  10/05/21 24.68 kg/m  05/30/21 24.54 kg/m     Physical Exam General: Sitting in chair, no acute distress Eyes: EOMI, icterus Neck: Supple no JVP Pulmonary: Clear, normal work breathing Cardiovascular: Warm, no edema Abdomen: Nondistended, soft present MSK:-Muscular no joint effusion Neuro: Normal gait, no weakness Psych: Normal mood, full affect   Assessment & Plan:   Prolonged exacerbation of obstructive lung disease: Symptoms started several weeks ago with worsening several days ago.  Prednisone and Z-Pak called in by this office.  With lingering symptoms although improved.  Still a ot of cough and mucus production.  Prolonged prednisone taper ordered today, 40 mg and decrease by half every 5 days until stops.  Augmentin twice daily for 5 days.  Chest x-ray today.  Lung exam is clear which is reassuring.  Discussed short-term use of prednisone versus long-term use, he is leery of prolonged prednisone courses given his understanding of side effects of prednisone.  Okay to stop prednisone taper early if symptoms markedly improved in the interim.   Return in about 8 weeks (around 05/18/2022).   Lanier Clam, MD 03/24/2022   This appointment required 45 minutes of patient care (this includes precharting, chart review, review of results, face-to-face care, etc.).

## 2022-03-27 NOTE — Progress Notes (Signed)
Chest xray is clear - no pneumonia

## 2022-05-02 ENCOUNTER — Telehealth: Payer: Self-pay | Admitting: Internal Medicine

## 2022-05-02 NOTE — Telephone Encounter (Signed)
Pt. Calling to get refills on albuterol (VENTOLIN HFA) 108 (90 Base) MCG/ACT inhaler  sent to pharmacy he is out

## 2022-05-03 ENCOUNTER — Other Ambulatory Visit: Payer: Self-pay

## 2022-05-03 MED ORDER — ALBUTEROL SULFATE HFA 108 (90 BASE) MCG/ACT IN AERS: 2.0000 | INHALATION_SPRAY | Freq: Four times a day (QID) | RESPIRATORY_TRACT | 6 refills | Status: DC | PRN

## 2022-05-03 NOTE — Telephone Encounter (Signed)
Ok to refill albuterol. °

## 2022-05-03 NOTE — Telephone Encounter (Signed)
Dr. Melvyn Novas are you okay with [place] refilling patients albuterol inhaler since you have never prescribed this

## 2022-05-03 NOTE — Telephone Encounter (Signed)
Refill for albuterol inhaler has been sent. Patient is aware. Nothing further needed.

## 2022-05-23 ENCOUNTER — Ambulatory Visit: Payer: Medicare Other | Admitting: Internal Medicine

## 2022-06-06 NOTE — Progress Notes (Unsigned)
Subjective:   Patient ID: Elijah Marshall, male    DOB: 1950/10/09  MRN: [id number]   Brief patient profile:  72 yobm quit smoking 2013  and able work out at [employer] and did fine until hurt back 2011 then 2 back surgeries and knee surgery  and more noticeable doe  since then but also limited by back and s/p sup segmentectomy RLL NSC lung ca Ia 05/2015  referred to pulmonary clinic 09/05/2013 for ? Copd  With GOLD II criteria 10/29/13 and confirmed 01/10/2016     History of Present Illness  09/05/2013 1st [facility] office visit/ Elijah Marshall  Chief Complaint  Patient presents with   Pulmonary Consult    Referred per Dr. Norberto Sorenson.  Pt c/o SOB for the past 3 yrs. He states that he was dxed with COPD back in 2012 or 2013.  He states that that he sometimes has trouble with breathing when walking up stairs and lifting things.    rx spiriva first, alb, dulera not as effective  No problem at rest or supine Assoc nasal congestion much better p shot (?depomedrol) and bad again x sev years corresponds to worse doe  Ex = walking one mile slower pace than wife,trouble with hills, worse in heat  Bad hb even on dexilant  Min dry cough  rec Stop spiriva  Start anoro two puffs off one click each am Zantac 150 mg one at  Bedtime Prednisone 10 mg take  4 each am x 2 days,   2 each am x 2 days,  1 each am x 2 days and stop  GERD diet     11/12/2020  f/u ov/Elijah Marshall re: GOLD 3    maint on breztri with baseline saba = rarely   Chief Complaint  Patient presents with   Follow-up    SOB has slightly improved, coughing has worsened with phlegm   Dyspnea:  not walking much this week, flared 9/26 and ended up in ER 9/30 Cough: mucus is yellowish finished up zpak  11/10/20  Sleeping: kept up by cough, sense of throat congestion, flat bed with 3-4 pillows  SABA use: not using  02: only with ex  Covid status:   vax x 3  Rec Prevacid 30 mg Take 30- 60 min before your first and last meals of the day  For cough >   mucinex dm 1200 mg every 12 hours  as needed  Flonase is twice daily each nostril (point to opposite ear)  Prednisone 10 mg take  4 each am x 2 days,   2 each am x 2 days,  1 each am x 2 days and stop  Plan A = Automatic = Always=   Breztri Take 2 puffs first thing in am and then another 2 puffs about 12 hours later.  Work on inhaler technique:  Plan B = Backup (to supplement plan A, not to replace it) Only use your albuterol inhaler as a rescue medication  GERD diet   12/08/20 called in pred x 6 days/ zpak   12/17/2020  Acute ov/Elijah Marshall re: GOLD 3 "copd flare" dates all the way to Sept 2022   maint on breztri 2bid /  not taking ppi ac  Chief Complaint  Patient presents with   Follow-up    Pt states f/u for cough he have seen some improvement this morning  Dyspnea:  ? Some better / doing low[employer] shopping ok pushing cart  Cough: severe almost to point of choking  despite pred/ zpak Sleeping: bed is flat / 3 pillows worse first thing in am  SABA use: a few times since d/c from hospital / no neb  02: 2.5 lpm prn  Covid status:   covd x 2 / never infected  Rec Flonase ( nasal steroids) have no immediate benefit in terms of improving symptoms  Stop breztri  start stiolto 2 puffs each  and prednisone 10 mg 2 each am until better then 1 daily for 5 days and stop  For cough mucinex dm  every 12 hours and use the flutter valve as much as possible  We will call you to set up sinus CT > nl Be sure  Prevacid is 30 mg Take 30- 60 min before your first and last meals of the day and take pepcid 20 mg after supper and bedtime  GERD diet reviewed, bed blocks rec    EGD  05/19/21   Dr Elnoria Howard   12/06/2021  f/u ov/Elijah Marshall re: GOLD 3  maint on stiolto and last prednisone 11/25/21   Chief Complaint  Patient presents with   Follow-up    Cough with phlem x 2 weeks  Dyspnea:  no change  Cough: white mucus  Sleeping: flat bed, 3 pillows SABA use: no 02: 2.5 lpm prn  Lung cancer screening :  Elijah Marshall   Rec No  change in medication  My office will be contacting you by phone for referral to ENT   >  not done as of  06/07/2022   Please schedule a follow up visit in 6 months but call sooner if needed    06/07/2022  f/u ov/Elijah Marshall re: GOLD 3    maint on Stiolto 2 each am and prn pred last 2 weeks prior to OV    Chief Complaint  Patient presents with   Follow-up    Sob-good and bad days with allergies, occass. Wheezing, cough-yellow, white  Dyspnea:  MMRC2 = can't walk a nl pace on a flat grade s sob but does fine slow and flat   Cough: mostly daytime but worse p supper and convinced too much mucus in back of throat and cc thick Wads of mucus when snorts/coughs  minimally discolored  Sleeping: flat bed 3 pillows  SABA use: not more than a few times a week 02: prn /not hs      No obvious day to day or daytime variability or assoc or hemoptysis or cp or chest tightness, subjective wheeze or overt sinus or hb symptoms.   Sleeping as above without nocturnal  or early am exacerbation  of respiratory  c/o's or need for noct saba. Also denies any obvious fluctuation of symptoms with weather or environmental changes or other aggravating or alleviating factors except as outlined above   No unusual exposure hx or h/o childhood pna/ asthma or knowledge of premature birth.  Current Allergies, Complete Past Medical History, Past Surgical History, Family History, and Social History were reviewed in [employer] record.  ROS  The following are not active complaints unless bolded Hoarseness, sore throat, dysphagia, dental problems, itching, sneezing,  nasal congestion or discharge of excess mucus or purulent secretions, ear ache,   fever, chills, sweats, unintended wt loss or wt gain, classically pleuritic or exertional cp,  orthopnea pnd or arm/hand swelling  or leg swelling, presyncope, palpitations, abdominal pain, anorexia, nausea, vomiting, diarrhea  or change in bowel habits or change in bladder  habits, change in stools or change in urine, dysuria, hematuria,  rash, arthralgias, visual complaints, headache, numbness, weakness or ataxia or problems with walking or coordination,  change in mood or  memory.        Current Meds  Medication Sig   albuterol (VENTOLIN HFA) 108 (90 Base) MCG/ACT inhaler Inhale 2 puffs into the lungs every 6 (six) hours as needed for wheezing or shortness of breath.   atorvastatin (LIPITOR) 40 MG tablet TAKE 1 TABLET BY MOUTH EVERY DAY (Patient taking differently: Take 40 mg by mouth daily.)   azelastine (ASTELIN) 0.1 % nasal spray Place 1 spray into both nostrils 2 (two) times daily. Use in each nostril as directed   baclofen (LIORESAL) 10 MG tablet Take 10 mg by mouth daily as needed for muscle spasms.   Brimonidine Tartrate (LUMIFY) 0.025 % SOLN Apply 1 drop to eye daily.   famotidine (PEPCID) 40 MG tablet Take 1 tablet (40 mg total) by mouth 2 (two) times daily. (Patient taking differently: Take 40 mg by mouth daily as needed for heartburn.)   fluticasone (FLONASE) 50 MCG/ACT nasal spray Place 1 spray into both nostrils daily.   hydrochlorothiazide (HYDRODIURIL) 25 MG tablet Take 25 mg by mouth daily.   HYDROcodone-acetaminophen (NORCO/VICODIN) 5-325 MG tablet Take 1-2 tablets by mouth every 4 (four) hours as needed for moderate pain or severe pain.   lansoprazole (PREVACID) 30 MG capsule TAKE 1 CAPSULE (30 MG TOTAL) BY MOUTH 2 (TWO) TIMES DAILY. (Patient taking differently: Take 30 mg by mouth 2 (two) times daily before a meal.)   levocetirizine (XYZAL) 5 MG tablet Take 5 mg by mouth every evening.   metoprolol succinate (TOPROL-XL) 50 MG 24 hr tablet TAKE 1 TABLET BY MOUTH DAILY. TAKE WITH OR IMMEDIATELY FOLLOWING A MEAL.   Multiple Vitamin (MULTIVITAMIN ADULT PO) Take by mouth.   OXYGEN Inhale 2 L into the lungs daily as needed (with exertion only).   polyethylene glycol (MIRALAX / GLYCOLAX) 17 g packet Take 17 g by mouth daily.   sildenafil (VIAGRA) 100  MG tablet Take 100 mg by mouth daily as needed for erectile dysfunction.   Tiotropium Bromide-Olodaterol (STIOLTO RESPIMAT) 2.5-2.5 MCG/ACT AERS INHALE 2 PUFFS BY MOUTH INTO THE LUNGS DAILY   Vitamin D, Ergocalciferol, (DRISDOL) 1.25 MG (50000 UNIT) CAPS capsule Take 50,000 Units by mouth once a week.              Objective:  Physical Exam  Wts  06/07/2022          176   12/06/2021      176  05/30/2021        171 12/17/2020      167  11/12/2020        168  09/28/2020        168  07/28/2019        179   05/20/2019       178   12/17/2018      182  04/01/2018        182  12/28/2017      176  08/27/2017        176  06/22/2017        177  05/22/2017        183  04/19/2017        179      07/31/2016        182  05/16/2016        181  04/28/2016        185  01/10/2016  179    11/19/15 182 lb (82.6 kg)  11/11/15 180 lb (81.6 kg)  11/04/15 181 lb 3.5 oz (82.2 kg)    Vital signs reviewed  06/07/2022  - Note at rest 02 sats  95% on RA   General appearance:    anxious amb bm with freq throat clearing/ no rattle or congestion noted on voluntary cough      HEENT :  Oropharynx  clear/ no excess mucus   Nasal turbinates nl    NECK :  without JVD/Nodes/TM/ nl carotid upstrokes bilaterally   LUNGS: no acc muscle use,  Mod barrel  contour chest wall with bilateral  Distant bs s audible wheeze and  without cough on insp or exp maneuvers and mod  Hyperresonant  to  percussion bilaterally     CV:  RRR  no s3 or murmur or increase in P2, and no edema   ABD:  soft and nontender with pos mid insp Hoover's  in the supine position. No bruits or organomegaly appreciated, bowel sounds nl  MS:   Ext warm without deformities or   obvious joint restrictions , calf tenderness, cyanosis or clubbing  SKIN: warm and dry without lesions    NEURO:  alert, approp, nl sensorium with  no motor or cerebellar deficits apparent.

## 2022-06-07 ENCOUNTER — Ambulatory Visit: Payer: Medicare Other | Admitting: Internal Medicine

## 2022-06-07 ENCOUNTER — Encounter: Payer: Self-pay | Admitting: Internal Medicine

## 2022-06-07 VITALS — BP 130/78 | HR 82 | Temp 98.1°F | Ht 69.0 in | Wt 176.8 lb

## 2022-06-07 DIAGNOSIS — J310 Chronic rhinitis: Secondary | ICD-10-CM | POA: Diagnosis not present

## 2022-06-07 DIAGNOSIS — J449 Chronic obstructive pulmonary disease, unspecified: Secondary | ICD-10-CM | POA: Diagnosis not present

## 2022-06-07 DIAGNOSIS — J9611 Chronic respiratory failure with hypoxia: Secondary | ICD-10-CM | POA: Diagnosis not present

## 2022-06-07 NOTE — Assessment & Plan Note (Addendum)
Quit smoking 2013 - Spirometry  03/01/12  FEV1  1.63 - Spirometry   09/05/13 FEV1  1.34 (46%) ratio 44  - PFTs   10/29/2013    FEV1    1.70 (62%) ratio 50% no better after ssaba and DLCO 48%  - 09/05/2013  Walked RA x 3 laps @ fast pace @  185 ft each stopped due to end of study, fast pace,sat 89% at end  - 01/06/14 gradual worsening on anoro plus increase need for saba  - 01/28/2014   > try spiriva respimat/ symbicort 160 2bid > changed to symbicort 160 2bid only on 01/28/14  - 02/25/2014   90%   > changed to symbicort 80 2bid since hoarse on 160 and clear on exam    later.  - PFT's  01/10/2016  FEV1 1.38 (51 % ) ratio 47  p 1  % improvement from saba p symb 160  prior to study with DLCO  43/41  % corrects to 47  % for alv volume   - 02/21/2017  After extensive coaching inhaler device  effectiveness =    90% vs baseline < 25%  - 02/21/2017 changed to bevespi 2 bid  - 04/19/2017  Demonstrated elipta > try anoro to see if cost less/ works as well  - PFT's  05/22/2017  FEV1 1.21  (46 % ) ratio 46  p 1 % improvement from saba p anoro and albuterol 1 h prior to study with DLCO  38 % corrects to 49 % for alv volume   - changed to Incruse 06/04/17 due to tremors > resolved  - 04/01/2018    try change to bevespi 1-2 bid (one bid if too tremulous from 2)  - 10/21/2018 changed to anoro > globus sensation reported 12/17/2018  - 12/17/2020  After extensive coaching inhaler device,  effectiveness =    75% with SMI > changed to stiolto due to cough on breztri  Plus prednisone prn  - 12/17/2020 added pred as plan D = 20 mg per day until better then 10 mg per day x 5 d and stop - 12/17/2020 added flutter valve   Pt is Group B in terms of symptom/risk and laba/lama therefore appropriate rx at this point >>>  stiolto fine for now and in fact may be able to reduce dose to one puff daily in case some of his upper airway symptoms or bladder issues are related  - if no change in doe, fine to just use the one puff daily and use  the pred prn if does exacerbate    - The proper method of use, as well as anticipated side effects, of a metered-dose inhaler and respimat device were discussed and demonstrated to the patient using teach back method.

## 2022-06-07 NOTE — Patient Instructions (Addendum)
My office will be contacting you by phone for referral to ENT evaluation of your throat clearing   - if you don't hear back from my office within one week please call us back or notify us thru MyChart and we'll address it right away.   Ok to try just one puff of the stiolto each am   Ok to try mucinex dm 1200 mg every 12 hours as need needed for cough/ congestion  Please schedule a follow up visit in 6 months but call sooner if needed

## 2022-06-07 NOTE — Assessment & Plan Note (Addendum)
Sinus ct 10/22/13  : Normal paranasal sinuses. - rechallenge with singulair 10/29/2013 > d/c 01/28/2014 as not better  - repeat sinus CT 04/08/15 > neg  - Allergy profile 05/16/2016 >  Eos 0. 1/  IgE  14 neg RAST - repeat sinus CT  01/14/21 nl   - referred to ENT 05/30/2021 and again 12/06/2021 and again 06/07/2022   Globus sensation ? Etiology/ already on max gerd rx > ent f/u rec   F/u pulmonary clinic is q 6 m, sooner prn          Each maintenance medication was reviewed in detail including emphasizing most importantly the difference between maintenance and prns and under what circumstances the prns are to be triggered using an action plan format where appropriate.  Total time for H and P, chart review, counseling, reviewing smi/hfa  device(s) and generating customized AVS unique to this office visit / same day charting = 25 min

## 2022-06-07 NOTE — Assessment & Plan Note (Signed)
Dx 2017 SATURATION QUALIFICATIONS:  11/19/2015  Patient Saturations on Room Air at Rest = 96% Patient Saturations on Room Air while Ambulating = 86% Patient Saturations on 2  Liters of oxygen while Ambulating = 96% - 05/16/2016 :   Saturations on Room Air at Rest = 88%--- increased to 99% on 2lpm continuous with activity   -  07/28/2019   Walked RA  2 laps @ approx 272ft each @ avg pace  stopped due to  sob with sats still 91%  - 09/28/2020   Walked on RA x  2  lap(s) =  approx 500 @ avg pace,  with lowest 02 sats 91% and no sob   Using prn 02, again advised goal is  keep sats > 90%

## 2022-09-04 ENCOUNTER — Other Ambulatory Visit: Payer: Self-pay | Admitting: Internal Medicine

## 2022-09-05 ENCOUNTER — Telehealth: Payer: Self-pay | Admitting: Internal Medicine

## 2022-09-05 NOTE — Telephone Encounter (Signed)
Patient states needs refill for Stiolto Respimat. Pharmacy is [facility] [address]. Patient phone number is [phone].

## 2022-09-06 NOTE — Telephone Encounter (Signed)
Spoke with patient. Advised script for Stiolto inhaler has been sent to pharmacy. Closing encounter. NFN

## 2022-09-21 ENCOUNTER — Other Ambulatory Visit: Payer: Self-pay | Admitting: Internal Medicine

## 2022-09-23 ENCOUNTER — Emergency Department (HOSPITAL_BASED_OUTPATIENT_CLINIC_OR_DEPARTMENT_OTHER): Payer: Medicare Other

## 2022-09-23 ENCOUNTER — Other Ambulatory Visit: Payer: Self-pay

## 2022-09-23 ENCOUNTER — Encounter (HOSPITAL_BASED_OUTPATIENT_CLINIC_OR_DEPARTMENT_OTHER): Payer: Self-pay

## 2022-09-23 ENCOUNTER — Emergency Department (HOSPITAL_BASED_OUTPATIENT_CLINIC_OR_DEPARTMENT_OTHER)
Admission: EM | Admit: 2022-09-23 | Discharge: 2022-09-23 | Disposition: A | Discharge status: Home / Self Care | Payer: Medicare Other | Attending: Emergency Medicine | Admitting: Emergency Medicine

## 2022-09-23 DIAGNOSIS — M542 Cervicalgia: Secondary | ICD-10-CM | POA: Diagnosis present

## 2022-09-23 DIAGNOSIS — J449 Chronic obstructive pulmonary disease, unspecified: Secondary | ICD-10-CM | POA: Insufficient documentation

## 2022-09-23 DIAGNOSIS — M5012 Mid-cervical disc disorder, unspecified level: Secondary | ICD-10-CM | POA: Diagnosis not present

## 2022-09-23 DIAGNOSIS — M5090 Cervical disc disorder, unspecified, unspecified cervical region: Secondary | ICD-10-CM

## 2022-09-23 MED ORDER — HYDROCODONE-ACETAMINOPHEN 5-325 MG PO TABS
1.0000 | ORAL_TABLET | Freq: Once | ORAL | Status: AC
  Administered 2022-09-23: 1 via ORAL
  Filled 2022-09-23: qty 1

## 2022-09-23 MED ORDER — LIDOCAINE 5 % EX PTCH
1.0000 | MEDICATED_PATCH | CUTANEOUS | Status: DC
  Administered 2022-09-23: 1 via TRANSDERMAL
  Filled 2022-09-23: qty 1

## 2022-09-23 MED ORDER — CYCLOBENZAPRINE HCL 10 MG PO TABS
10.0000 mg | ORAL_TABLET | Freq: Once | ORAL | Status: AC
  Administered 2022-09-23: 10 mg via ORAL
  Filled 2022-09-23: qty 1

## 2022-09-23 MED ORDER — CYCLOBENZAPRINE HCL 10 MG PO TABS: 10.0000 mg | ORAL_TABLET | Freq: Two times a day (BID) | ORAL | 0 refills | Status: AC | PRN

## 2022-09-23 MED ORDER — METHYLPREDNISOLONE 4 MG PO TBPK: ORAL_TABLET | ORAL | 0 refills | Status: DC

## 2022-09-23 NOTE — ED Triage Notes (Signed)
Pt reports to ED with complaints of neck pain and has been going on x 3 days.

## 2022-09-23 NOTE — ED Provider Notes (Signed)
[place] [facility] Provider Note   CSN: [id number] Arrival date & time: 09/23/22  0730     History  Chief Complaint  Patient presents with   Torticollis    Elijah Marshall is a 72 y.o. male.  72 year old male with a history of lung cancer in remission and possible COPD who presents to the emergency department with neck pain.  Patient reports that 3 days ago he woke up and had some left trapezius pain.  Thought that he slept on his neck wrong.  No other injuries or heavy lifting.  Sharp and worse with movement of his neck.  No fevers, bowel or bladder incontinence, weakness or numbness of his arms or legs aside from his right arm which sometimes is sore.  No cervical spine surgeries but does report a history of lower back surgery.       Home Medications Prior to Admission medications   Medication Sig Start Date End Date Taking? Authorizing Provider  cyclobenzaprine (FLEXERIL) 10 MG tablet Take 1 tablet (10 mg total) by mouth 2 (two) times daily as needed for muscle spasms. 09/23/22  Yes Rondel Baton, MD  methylPREDNISolone (MEDROL DOSEPAK) 4 MG TBPK tablet Take as directed on the packaging 09/23/22  Yes Rondel Baton, MD  albuterol (VENTOLIN HFA) 108 (90 Base) MCG/ACT inhaler Inhale 2 puffs into the lungs every 6 (six) hours as needed for wheezing or shortness of breath. 05/03/22   Nyoka Cowden, MD  atorvastatin (LIPITOR) 40 MG tablet TAKE 1 TABLET BY MOUTH EVERY DAY Patient taking differently: Take 40 mg by mouth daily. 04/29/20   Patwardhan, Anabel Bene, MD  azelastine (ASTELIN) 0.1 % nasal spray Place 1 spray into both nostrils 2 (two) times daily. Use in each nostril as directed 08/23/21   Nyoka Cowden, MD  baclofen (LIORESAL) 10 MG tablet Take 10 mg by mouth daily as needed for muscle spasms.    [provider]  Brimonidine Tartrate (LUMIFY) 0.025 % SOLN Apply 1 drop to eye daily.    [provider]  famotidine  (PEPCID) 40 MG tablet Take 1 tablet (40 mg total) by mouth 2 (two) times daily. Patient taking differently: Take 40 mg by mouth daily as needed for heartburn. 01/07/18   Sherren Mocha, MD  fluticasone (FLONASE) 50 MCG/ACT nasal spray SPRAY 1 SPRAY INTO BOTH NOSTRILS DAILY. 09/22/22   Nyoka Cowden, MD  hydrochlorothiazide (HYDRODIURIL) 25 MG tablet Take 25 mg by mouth daily. 01/27/20   [provider]  HYDROcodone-acetaminophen (NORCO/VICODIN) 5-325 MG tablet Take 1-2 tablets by mouth every 4 (four) hours as needed for moderate pain or severe pain. 07/29/20   Almond Lint, MD  lansoprazole (PREVACID) 30 MG capsule TAKE 1 CAPSULE (30 MG TOTAL) BY MOUTH 2 (TWO) TIMES DAILY. Patient taking differently: Take 30 mg by mouth 2 (two) times daily before a meal. 02/13/19   Nyoka Cowden, MD  levocetirizine (XYZAL) 5 MG tablet Take 5 mg by mouth every evening.    [provider]  metoprolol succinate (TOPROL-XL) 50 MG 24 hr tablet TAKE 1 TABLET BY MOUTH DAILY. TAKE WITH OR IMMEDIATELY FOLLOWING A MEAL. 03/13/22   Patwardhan, Anabel Bene, MD  Multiple Vitamin (MULTIVITAMIN ADULT PO) Take by mouth.    [provider]  OXYGEN Inhale 2 L into the lungs daily as needed (with exertion only).    [provider]  polyethylene glycol (MIRALAX / GLYCOLAX) 17 g packet Take 17 g  by mouth daily.    [provider]  sildenafil (VIAGRA) 100 MG tablet Take 100 mg by mouth daily as needed for erectile dysfunction.    [provider]  Tiotropium Bromide-Olodaterol (STIOLTO RESPIMAT) 2.5-2.5 MCG/ACT AERS INHALE 2 PUFFS BY MOUTH INTO THE LUNGS DAILY 09/05/22   Nyoka Cowden, MD  Vitamin D, Ergocalciferol, (DRISDOL) 1.25 MG (50000 UNIT) CAPS capsule Take 50,000 Units by mouth once a week. 09/06/21   [provider]      Allergies    Aspirin, Morphine, and Morphine and codeine    Review of Systems   Review of Systems  Physical Exam Updated Vital Signs BP (!) 142/86    Pulse 86   Temp 98.5 F (36.9 C) (Oral)   Resp 18   Ht 5\' 9"  (1.753 m)   Wt 78.9 kg   SpO2 98%   BMI 25.70 kg/m  Physical Exam Vitals and nursing note reviewed.  Constitutional:      General: He is not in acute distress.    Appearance: He is well-developed.  HENT:     Head: Normocephalic and atraumatic.     Right Ear: External ear normal.     Left Ear: External ear normal.     Nose: Nose normal.  Eyes:     Extraocular Movements: Extraocular movements intact.     Conjunctiva/sclera: Conjunctivae normal.     Pupils: Pupils are equal, round, and reactive to light.  Neck:     Comments: Tenderness palpation of left trapezius.  No midline C-spine tenderness palpation or step-offs. Pulmonary:     Effort: Pulmonary effort is normal. No respiratory distress.  Musculoskeletal:     Cervical back: Normal range of motion and neck supple.     Right lower leg: No edema.     Left lower leg: No edema.  Skin:    General: Skin is warm and dry.  Neurological:     Mental Status: He is alert. Mental status is at baseline.     Comments: Symmetrically palpable radial and ulnar pulses. Capillary refill <2 seconds to all digits.  Intact sensation to light touch of the radial, median and ulnar nerves demonstrated by testing in the dorsal web space of the thumb, the hypothenar eminence of the palm, and the radial aspect of the dorsum of the hand.  5/5 strength in bilateral deltoids, biceps, triceps.   Intact motor function of the radial, median and ulnar nerves demonstrated by strength of hand grip, and spreading of the 2nd through 5th digits, thumb apposition, and ability to make "OK sign".  Motor: Muscle bulk and tone are normal. Strength is 5/5 in hip flexion, knee flexion and extension, ankle dorsiflexion and plantar flexion bilaterally. Full strength of great toe dorsiflexion bilaterally.  Sensory: Intact sensation to light touch in L2 though S1 dermatomes bilaterally.   Psychiatric:         Mood and Affect: Mood normal.        Behavior: Behavior normal.     ED Results / Procedures / Treatments   Labs (all labs ordered are listed, but only abnormal results are displayed) Labs Reviewed - No data to display  EKG EKG Interpretation Date/Time:  Saturday September 23 2022 09:28:13 EDT Ventricular Rate:  76 PR Interval:  240 QRS Duration:  84 QT Interval:  380 QTC Calculation: 428 R Axis:   -24  Text Interpretation: Sinus rhythm Prolonged PR interval Borderline left axis deviation Anteroseptal infarct, age indeterminate No significant change since 11/05/20  Confirmed by Vonita Moss [phone]) on 09/23/2022 9:32:30 AM  Radiology DG Cervical Spine Complete  Result Date: 09/23/2022 CLINICAL DATA:  Posterior neck pain. Pain worse with turning head. Torticollis. EXAM: CERVICAL SPINE - COMPLETE 4+ VIEW COMPARISON:  None Available. FINDINGS: Suboptimal visualization of the C7 vertebra secondary to overlap of osseous structures. No signs of acute posttraumatic malalignment of the cervical spine. Mild retrolisthesis C5 on C6 measures 3 mm. No signs of acute fracture or subluxation. Moderate to severe multilevel disc space narrowing with endplate spurring is noted at C3-4 through C6-7. This is most severe at the C4-5 level. IMPRESSION: 1. No acute findings. 2. Moderate to severe multilevel degenerative disc disease. 3. Mild retrolisthesis C5 on C6, likely secondary to spondylosis. Electronically Signed   By: Signa Kell M.D.   On: 09/23/2022 09:17    Procedures Procedures    Medications Ordered in ED Medications  cyclobenzaprine (FLEXERIL) tablet 10 mg (10 mg Oral Given 09/23/22 0836)  HYDROcodone-acetaminophen (NORCO/VICODIN) 5-325 MG per tablet 1 tablet (1 tablet Oral Given 09/23/22 6045)    ED Course/ Medical Decision Making/ A&P                                 Medical Decision Making Amount and/or Complexity of Data Reviewed Radiology: ordered.  Risk Prescription drug  management.   Elijah Marshall is a 72 y.o. male with comorbidities that complicate the patient evaluation including lung cancer admission and possible COPD presents to the emergency department with neck pain  Initial Ddx:  Muscle strain, pathologic fracture, spinal cord compression  MDM/Course:  Patient presents to the emergency department with several days of neck pain.  On exam is very tender over his left trapezius.  Is neurovascularly intact and no signs of spinal cord compression.  Upon re-evaluation after being given lidocaine and Flexeril still having some pain so was given Norco.  Reported his pain had improved significantly.  Out of an abundance of caution he did also have an EKG to evaluate for MI given his risk factors which did not show any signs of acute MI.  Not having any other anginal equivalents at this time so do not feel that blood work is necessarily warranted.  Was started on a Medrol Dosepak and given muscle relaxer to take at home.  Will have him follow-up with spine.  This patient presents to the ED for concern of complaints listed in HPI, this involves an extensive number of treatment options, and is a complaint that carries with it a high risk of complications and morbidity. Disposition including potential need for admission considered.   Dispo: DC Home. Return precautions discussed including, but not limited to, those listed in the AVS. Allowed pt time to ask questions which were answered fully prior to dc.  Additional history obtained from spouse Records reviewed Outpatient Clinic Notes I personally reviewed and interpreted cardiac monitoring: normal sinus rhythm  I personally reviewed and interpreted the pt's EKG: see above for interpretation  I have reviewed the patients home medications and made adjustments as needed Social Determinants of health:  Elderly         Final Clinical Impression(s) / ED Diagnoses Final diagnoses:  Neck pain  Cervical disc  disease    Rx / DC Orders ED Discharge Orders          Ordered    cyclobenzaprine (FLEXERIL) 10 MG tablet  2 times daily PRN  09/23/22 0929    methylPREDNISolone (MEDROL DOSEPAK) 4 MG TBPK tablet        09/23/22 0929              Rondel Baton, MD 09/23/22 1901

## 2022-09-23 NOTE — Discharge Instructions (Signed)
You were seen for your neck pain in the emergency department.   At home, please take over-the-counter pain medication as well as the steroid taper and muscle relaxer we have prescribed you.  Do not take the muscle relaxer (Flexeril) with any opiate containing pain medications at the same time..    Check your MyChart online for the results of any tests that had not resulted by the time you left the emergency department.   Follow-up with your primary doctor in 2-3 days regarding your visit.  Follow-up with the spine doctors about your neck pain.  Return immediately to the emergency department if you experience any of the following: Weakness, bowel or bladder incontinence, or any other concerning symptoms.    Thank you for visiting our Emergency Department. It was a pleasure taking care of you today.

## 2022-10-06 ENCOUNTER — Ambulatory Visit: Payer: Medicare Other | Admitting: Cardiology

## 2022-10-06 ENCOUNTER — Encounter: Payer: Self-pay | Admitting: Cardiology

## 2022-10-06 VITALS — BP 148/93 | HR 74 | Resp 16 | Ht 69.0 in | Wt 173.0 lb

## 2022-10-06 DIAGNOSIS — I484 Atypical atrial flutter: Secondary | ICD-10-CM

## 2022-10-06 NOTE — Progress Notes (Signed)
Patient referred by Sherren Mocha, MD for tachycardia  Subjective:   Elijah Marshall, male    DOB: 09-23-1950, 72 y.o.   MRN: [id number]   Chief Complaint  Patient presents with   Atypical atrial flutter   Follow-up    1 year    HPI   72 y/o [place] American male with hypertension, paroxysmal atrial flutter, COPD GOLD III, former smoker, h/o non-small cell carcinoma of right lung s/p lobectomy  Patient is doing well.  He has not had any recent palpitation symptoms.  Blood pressure elevated today, but was completely normal on all recent checks.  He recently had minimal blood after using fluticasone nasal spray, but has not had any other hemoptysis.  He had lipid panel checked with his PCP, results not available to me.    Current Outpatient Medications:    albuterol (VENTOLIN HFA) 108 (90 Base) MCG/ACT inhaler, Inhale 2 puffs into the lungs every 6 (six) hours as needed for wheezing or shortness of breath., Disp: 1 each, Rfl: 6   atorvastatin (LIPITOR) 40 MG tablet, TAKE 1 TABLET BY MOUTH EVERY DAY (Patient taking differently: Take 40 mg by mouth daily.), Disp: 90 tablet, Rfl: 0   azelastine (ASTELIN) 0.1 % nasal spray, Place 1 spray into both nostrils 2 (two) times daily. Use in each nostril as directed, Disp: 30 mL, Rfl: 11   baclofen (LIORESAL) 10 MG tablet, Take 10 mg by mouth daily as needed for muscle spasms., Disp: , Rfl:    Brimonidine Tartrate (LUMIFY) 0.025 % SOLN, Apply 1 drop to eye daily., Disp: , Rfl:    cyclobenzaprine (FLEXERIL) 10 MG tablet, Take 1 tablet (10 mg total) by mouth 2 (two) times daily as needed for muscle spasms., Disp: 20 tablet, Rfl: 0   famotidine (PEPCID) 40 MG tablet, Take 1 tablet (40 mg total) by mouth 2 (two) times daily. (Patient taking differently: Take 40 mg by mouth daily as needed for heartburn.), Disp: 180 tablet, Rfl: 3   fluticasone (FLONASE) 50 MCG/ACT nasal spray, SPRAY 1 SPRAY INTO BOTH NOSTRILS DAILY., Disp: 16 mL, Rfl: 11    hydrochlorothiazide (HYDRODIURIL) 25 MG tablet, Take 25 mg by mouth daily., Disp: , Rfl:    HYDROcodone-acetaminophen (NORCO/VICODIN) 5-325 MG tablet, Take 1-2 tablets by mouth every 4 (four) hours as needed for moderate pain or severe pain., Disp: 40 tablet, Rfl: 0   lansoprazole (PREVACID) 30 MG capsule, TAKE 1 CAPSULE (30 MG TOTAL) BY MOUTH 2 (TWO) TIMES DAILY. (Patient taking differently: Take 30 mg by mouth 2 (two) times daily before a meal.), Disp: 180 capsule, Rfl: 1   levocetirizine (XYZAL) 5 MG tablet, Take 5 mg by mouth every evening., Disp: , Rfl:    methylPREDNISolone (MEDROL DOSEPAK) 4 MG TBPK tablet, Take as directed on the packaging, Disp: 21 each, Rfl: 0   metoprolol succinate (TOPROL-XL) 50 MG 24 hr tablet, TAKE 1 TABLET BY MOUTH DAILY. TAKE WITH OR IMMEDIATELY FOLLOWING A MEAL., Disp: 90 tablet, Rfl: 3   Multiple Vitamin (MULTIVITAMIN ADULT PO), Take by mouth., Disp: , Rfl:    OXYGEN, Inhale 2 L into the lungs daily as needed (with exertion only)., Disp: , Rfl:    polyethylene glycol (MIRALAX / GLYCOLAX) 17 g packet, Take 17 g by mouth daily., Disp: , Rfl:    sildenafil (VIAGRA) 100 MG tablet, Take 100 mg by mouth daily as needed for erectile dysfunction., Disp: , Rfl:    Tiotropium Bromide-Olodaterol (STIOLTO RESPIMAT) 2.5-2.5 MCG/ACT AERS, INHALE  2 PUFFS BY MOUTH INTO THE LUNGS DAILY, Disp: 4 g, Rfl: 5   Vitamin D, Ergocalciferol, (DRISDOL) 1.25 MG (50000 UNIT) CAPS capsule, Take 50,000 Units by mouth once a week., Disp: , Rfl:   Cardiovascular and other pertinent studies:  EKG 10/06/2022: Sinus rhythm 76 bpm Prolonged PR interval Borderline left axis deviation Anteroseptal infarct, age indeterminate  CT chest 12/2021: 1. No evidence of lung cancer recurrence. 2. Coronary artery calcification and Aortic Atherosclerosis (ICD10-I70.0).      Echocardiogram 03/25/2020:  Left ventricle cavity is normal in size and wall thickness. Normal global  wall motion. Normal LV  systolic function with EF 65%. Normal diastolic  filling pattern.  No significant valvular abnormality.  normal right atrial pressure.   Mobile cardiac telemetry 6 days 03/17/2020 - 03/24/2020: Dominant rhythm: Sinus. HR 54-110 bpm. Avg HR 77 bpm. <1% isolated SVE, couplet <1% isolated VE, no couplet/triplets. No atrial fibrillation/atrial flutter/SVT/VT/high grade AV block, sinus pause >3sec noted. 1 patient triggered event correlates with artifact  Recent labs: 08/17/2022: Glucose 82, BUN/Cr 9/1.15. EGFR 68. Na/K 140/3.4. AlKP 125. Rest of the CMP normal H/H 17/51. MCV 86. Platelets 227  12/20/2020: Glucose 120, BUN/Cr 11/1.07. EGFR >60. Na/K 138/3.3. Rest of the CMP normal H/H 16/47. MCV 87. Platelets 271   Review of Systems  Cardiovascular:  Negative for chest pain, dyspnea on exertion, leg swelling, palpitations and syncope.         There were no vitals filed for this visit.   There is no height or weight on file to calculate BMI. There were no vitals filed for this visit.    Objective:   Physical Exam Vitals and nursing note reviewed.  Constitutional:      General: He is not in acute distress. Neck:     Vascular: No JVD.  Cardiovascular:     Rate and Rhythm: Normal rate and regular rhythm.     Heart sounds: Normal heart sounds. No murmur heard. Pulmonary:     Effort: Pulmonary effort is normal.     Breath sounds: Normal breath sounds. No wheezing or rales.  Abdominal:     Comments: Healed periumbilical scar  Musculoskeletal:     Right lower leg: No edema.     Left lower leg: No edema.          Assessment & Recommendations:   72 y/o [place] American male with hypertension, paroxysmal atrial flutter, COPD GOLD III, former smoker, h/o non-small cell carcinoma of right lung s/p lobectomy, h/o recent gastric surgery  Paroxysmal atrial flutter: Episode in February 2022, with no clinical or EKG evidence of recurrence. CHA2DS2VASc score 2, annual stroke  risk 2.2% In absence of recurrent atrial flutter, he does not need anticoagulation at this time Continue metoprolol succinate 50 mg daily.  Hypertension: Well-controlled.  His recent mild blood in sputum noted on coughing after using frequent spray could be benign due to irritation.  However, given his lung cancer history, if he start seeing more blood in sputum, he should definitely contact his lung cancer doctor.    F/u in1 year    Elder Negus, MD Pager: [phone] Office: [phone]

## 2022-12-08 ENCOUNTER — Encounter: Payer: Self-pay | Admitting: Internal Medicine

## 2022-12-08 ENCOUNTER — Ambulatory Visit: Payer: Medicare Other | Admitting: Internal Medicine

## 2022-12-08 ENCOUNTER — Encounter (INDEPENDENT_AMBULATORY_CARE_PROVIDER_SITE_OTHER): Payer: Self-pay | Admitting: Otolaryngology

## 2022-12-08 VITALS — BP 124/76 | HR 85 | Temp 98.6°F | Ht 69.0 in | Wt 173.2 lb

## 2022-12-08 DIAGNOSIS — J9611 Chronic respiratory failure with hypoxia: Secondary | ICD-10-CM | POA: Diagnosis not present

## 2022-12-08 DIAGNOSIS — J310 Chronic rhinitis: Secondary | ICD-10-CM | POA: Diagnosis not present

## 2022-12-08 DIAGNOSIS — J449 Chronic obstructive pulmonary disease, unspecified: Secondary | ICD-10-CM | POA: Diagnosis not present

## 2022-12-08 DIAGNOSIS — Z87891 Personal history of nicotine dependence: Secondary | ICD-10-CM | POA: Insufficient documentation

## 2022-12-08 DIAGNOSIS — R09A2 Foreign body sensation, throat: Secondary | ICD-10-CM

## 2022-12-08 MED ORDER — STIOLTO RESPIMAT 2.5-2.5 MCG/ACT IN AERS: 2.0000 | INHALATION_SPRAY | Freq: Every day | RESPIRATORY_TRACT | Status: DC

## 2022-12-08 NOTE — Patient Instructions (Addendum)
Make sure you check your oxygen saturation  AT  your highest level of activity (not after you stop)   to be sure it stays over 90% and pace yourself slower or  supplement with  02 as needed  to maintain this level if needed but remember to turn it back to previous settings when you stop (to conserve your supply).   My office will be contacting you by phone for referral to ENT   - if you don't hear back from my office within one week please call us back or notify us thru MyChart and we'll address it right away.   Try  reduce your stiolto to one puff each am if tolerated    Please schedule a follow up visit in 6 months but call sooner if needed

## 2022-12-08 NOTE — Progress Notes (Unsigned)
Subjective:   Patient ID: Elijah Marshall, male    DOB: 03-09-1950  MRN: [id number]   Brief patient profile:  72 yobm quit smoking 2013  and able work out at [employer] and did fine until hurt back 2011 then 2 back surgeries and knee surgery  and more noticeable doe  since then but also limited by back and s/p sup segmentectomy RLL NSC lung ca Ia 05/2015  referred to pulmonary clinic 09/05/2013 for ? Copd  With GOLD II criteria 10/29/13 and confirmed 01/10/2016     History of Present Illness  09/05/2013 1st [facility] office visit/ Elijah Marshall  Chief Complaint  Patient presents with   Pulmonary Consult    Referred per Dr. Norberto Sorenson.  Pt c/o SOB for the past 3 yrs. He states that he was dxed with COPD back in 2012 or 2013.  He states that that he sometimes has trouble with breathing when walking up stairs and lifting things.    rx spiriva first, alb, dulera not as effective  No problem at rest or supine Assoc nasal congestion much better p shot (?depomedrol) and bad again x sev years corresponds to worse doe  Ex = walking one mile slower pace than wife,trouble with hills, worse in heat  Bad hb even on dexilant  Min dry cough  rec Stop spiriva  Start anoro two puffs off one click each am Zantac 150 mg one at  Bedtime Prednisone 10 mg take  4 each am x 2 days,   2 each am x 2 days,  1 each am x 2 days and stop  GERD diet     11/12/2020  f/u ov/Elijah Marshall re: GOLD 3    maint on breztri with baseline Elijah = rarely   Chief Complaint  Patient presents with   Follow-up    SOB has slightly improved, coughing has worsened with phlegm   Dyspnea:  not walking much this week, flared 9/26 and ended up in ER 9/30 Cough: mucus is yellowish finished up zpak  11/10/20  Sleeping: kept up by cough, sense of throat congestion, flat bed with 3-4 pillows  Elijah use: not using  02: only with ex  Covid status:   vax x 3  Rec Prevacid 30 mg Take 30- 60 min before your first and last meals of the day  For cough >   mucinex dm 1200 mg every 12 hours  as needed  Flonase is twice daily each nostril (point to opposite ear)  Prednisone 10 mg take  4 each am x 2 days,   2 each am x 2 days,  1 each am x 2 days and stop  Plan A = Automatic = Always=   Breztri Take 2 puffs first thing in am and then another 2 puffs about 12 hours later.  Work on inhaler technique:  Plan B = Backup (to supplement plan A, not to replace it) Only use your albuterol inhaler as a rescue medication  GERD diet   12/08/20 called in pred x 6 days/ zpak   12/17/2020  Acute ov/Elijah Marshall re: GOLD 3 "copd flare" dates all the way to Sept 2022   maint on breztri 2bid /  not taking ppi ac  Chief Complaint  Patient presents with   Follow-up    Pt states f/u for cough he have seen some improvement this morning  Dyspnea:  ? Some better / doing lowe's shopping ok pushing cart  Cough: severe almost to point of choking  despite pred/ zpak Sleeping: bed is flat / 3 pillows worse first thing in am  Elijah use: a few times since d/c from hospital / no neb  02: 2.5 lpm prn  Covid status:   covd x 2 / never infected  Rec Flonase ( nasal steroids) have no immediate benefit in terms of improving symptoms  Stop breztri  start stiolto 2 puffs each  and prednisone 10 mg 2 each am until better then 1 daily for 5 days and stop  For cough mucinex dm  every 12 hours and use the flutter valve as much as possible  We will call you to set up sinus CT > nl Be sure  Prevacid is 30 mg Take 30- 60 min before your first and last meals of the day and take pepcid 20 mg after supper and bedtime  GERD diet reviewed, bed blocks rec    EGD  05/19/21   Dr Elijah Marshall   12/06/2021  f/u ov/Elijah Marshall re: GOLD 3  maint on stiolto and last prednisone 11/25/21   Chief Complaint  Patient presents with   Follow-up    Cough with phlem x 2 weeks  Dyspnea:  no change  Cough: white mucus  Sleeping: flat bed, 3 pillows Elijah use: no 02: 2.5 lpm prn  Lung cancer screening :  Elijah Marshall   Rec No  change in medication  My office will be contacting you by phone for referral to ENT   >  not done as of  06/07/2022   Please schedule a follow up visit in 6 months but call sooner if needed    06/07/2022  f/u ov/Elijah Marshall re: GOLD 3    maint on Stiolto 2 each am and prn pred last 2 weeks prior to OV    Chief Complaint  Patient presents with   Follow-up    Sob-good and bad days with allergies, occass. Wheezing, cough-yellow, white  Dyspnea:  MMRC2 = can't walk a nl pace on a flat grade s sob but does fine slow and flat   Cough: mostly daytime but worse p supper and convinced too much mucus in back of throat and cc thick Wads of mucus when snorts/coughs  minimally discolored  Sleeping: flat bed 3 pillows  Elijah use: not more than a few times a week 02: prn /not hs  Rec My office will be contacting you by phone for referral to ENT evaluation of your throat clearing   Ok to try just one puff of the stiolto each am  Ok to try mucinex dm 1200 mg every 12 hours as need needed for cough/ congestion     12/08/2022  6 m f/u ov/Elijah Marshall re: GOLD 3 copd    maint on stiolto   ? Went to ENT? Chief Complaint  Patient presents with   Follow-up    Nasal congestion with PND x 1 month.  Dyspnea:  MMRC2 = can't walk a nl pace on a flat grade s sob but does fine slow and flat   Cough: still has sensation of globus / not present on awakening  Sleeping: flat bed 3 pillows s  resp cc  Elijah use: none  02: PRN while ex / cleaning    No obvious day to day or daytime variability or assoc excess/ purulent sputum or mucus plugs or hemoptysis or cp or chest tightness, subjective wheeze or overt sinus or hb symptoms.    Also denies any obvious fluctuation of symptoms with weather or environmental changes or  other aggravating or alleviating factors except as outlined above   No unusual exposure hx or h/o childhood pna/ asthma or knowledge of premature birth.  Current Allergies, Complete Past Medical History, Past Surgical  History, Family History, and Social History were reviewed in [employer] record.  ROS  The following are not active complaints unless bolded Hoarseness, sore throat, dysphagia, dental problems, itching, sneezing,  nasal congestion or discharge of excess mucus or purulent secretions, ear ache,   fever, chills, sweats, unintended wt loss or wt gain, classically pleuritic or exertional cp,  orthopnea pnd or arm/hand swelling  or leg swelling, presyncope, palpitations, abdominal pain, anorexia, nausea, vomiting, diarrhea  or change in bowel habits or change in bladder habits, change in stools or change in urine, dysuria, hematuria,  rash, arthralgias, visual complaints, headache, numbness, weakness or ataxia or problems with walking or coordination,  change in mood or  memory.        Current Meds  Medication Sig   albuterol (VENTOLIN HFA) 108 (90 Base) MCG/ACT inhaler Inhale 2 puffs into the lungs every 6 (six) hours as needed for wheezing or shortness of breath.   atorvastatin (LIPITOR) 40 MG tablet TAKE 1 TABLET BY MOUTH EVERY DAY (Patient taking differently: Take 40 mg by mouth daily.)   baclofen (LIORESAL) 10 MG tablet Take 10 mg by mouth daily as needed for muscle spasms.   Brimonidine Tartrate (LUMIFY) 0.025 % SOLN Apply 1 drop to eye daily.   cyclobenzaprine (FLEXERIL) 10 MG tablet Take 1 tablet (10 mg total) by mouth 2 (two) times daily as needed for muscle spasms.   famotidine (PEPCID) 40 MG tablet Take 1 tablet (40 mg total) by mouth 2 (two) times daily. (Patient taking differently: Take 40 mg by mouth daily as needed for heartburn.)   fluticasone (FLONASE) 50 MCG/ACT nasal spray SPRAY 1 SPRAY INTO BOTH NOSTRILS DAILY.   hydrochlorothiazide (HYDRODIURIL) 25 MG tablet Take 25 mg by mouth daily.   HYDROcodone-acetaminophen (NORCO/VICODIN) 5-325 MG tablet Take 1-2 tablets by mouth every 4 (four) hours as needed for moderate pain or severe pain.   lansoprazole (PREVACID) 30  MG capsule TAKE 1 CAPSULE (30 MG TOTAL) BY MOUTH 2 (TWO) TIMES DAILY. (Patient taking differently: Take 30 mg by mouth 2 (two) times daily before a meal.)   levocetirizine (XYZAL) 5 MG tablet Take 5 mg by mouth every evening.   metoprolol succinate (TOPROL-XL) 50 MG 24 hr tablet TAKE 1 TABLET BY MOUTH DAILY. TAKE WITH OR IMMEDIATELY FOLLOWING A MEAL.   montelukast (SINGULAIR) 10 MG tablet Take 10 mg by mouth daily.   Multiple Vitamin (MULTIVITAMIN ADULT PO) Take by mouth.   OXYGEN Inhale 2 L into the lungs daily as needed (with exertion only).   polyethylene glycol (MIRALAX / GLYCOLAX) 17 g packet Take 17 g by mouth daily.   sildenafil (VIAGRA) 100 MG tablet Take 100 mg by mouth daily as needed for erectile dysfunction.   Tiotropium Bromide-Olodaterol (STIOLTO RESPIMAT) 2.5-2.5 MCG/ACT AERS INHALE 2 PUFFS BY MOUTH INTO THE LUNGS DAILY   Vitamin D, Ergocalciferol, (DRISDOL) 1.25 MG (50000 UNIT) CAPS capsule Take 50,000 Units by mouth once a week.                  Objective:  Physical Exam  Wts  12/08/2022        173  06/07/2022          176   12/06/2021      176  05/30/2021  171 12/17/2020      167  11/12/2020        168  09/28/2020        168  07/28/2019        179   05/20/2019       178   12/17/2018      182  04/01/2018        182  12/28/2017      176  08/27/2017        176  06/22/2017        177  05/22/2017        183  04/19/2017        179      07/31/2016        182  05/16/2016        181  04/28/2016        185  01/10/2016        179    11/19/15 182 lb (82.6 kg)  11/11/15 180 lb (81.6 kg)  11/04/15 181 lb 3.5 oz (82.2 kg)    Vital signs reviewed  12/08/2022  - Note at rest 02 sats  ***% on ***   General appearance:    ***     Mod bar***

## 2022-12-09 ENCOUNTER — Encounter: Payer: Self-pay | Admitting: Internal Medicine

## 2022-12-09 DIAGNOSIS — R09A2 Foreign body sensation, throat: Secondary | ICD-10-CM | POA: Insufficient documentation

## 2022-12-09 NOTE — Assessment & Plan Note (Signed)
Quit smoking 2013 - Spirometry  03/01/12  FEV1  1.63 - Spirometry   09/05/13 FEV1  1.34 (46%) ratio 44  - PFTs   10/29/2013    FEV1    1.70 (62%) ratio 50% no better after ssaba and DLCO 48%  - 09/05/2013  Walked RA x 3 laps @ fast pace @  185 ft each stopped due to end of study, fast pace,sat 89% at end  - 01/06/14 gradual worsening on anoro plus increase need for saba  - 01/28/2014   > try spiriva respimat/ symbicort 160 2bid > changed to symbicort 160 2bid only on 01/28/14  - 02/25/2014   90%   > changed to symbicort 80 2bid since hoarse on 160 and clear on exam    later.  - PFT's  01/10/2016  FEV1 1.38 (51 % ) ratio 47  p 1  % improvement from saba p symb 160  prior to study with DLCO  43/41  % corrects to 47  % for alv volume    - 02/21/2017 changed to bevespi 2 bid  - 04/19/2017  Demonstrated elipta > try anoro to see if cost less/ works as well  - PFT's  05/22/2017  FEV1 1.21  (46 % ) ratio 46  p 1 % improvement from saba p anoro and albuterol 1 h prior to study with DLCO  38 % corrects to 49 % for alv volume   - changed to Incruse 06/04/17 due to tremors > resolved  - 04/01/2018    try change to bevespi 1-2 bid (one bid if too tremulous from 2)  - 10/21/2018 changed to anoro > globus sensation reported 12/17/2018   > changed to stiolto due to cough on breztri  Plus prednisone prn  - 12/17/2020 added pred as plan D = 20 mg per day until better then 10 mg per day x 5 d and stop - 12/17/2020 added flutter valve   - 12/08/2022  After extensive coaching inhaler device,  effectiveness =    90% SMI > try stiolto one puff daily if tol to see if any effect on  globus sensation     Pt is Group B in terms of symptom/risk and laba/lama therefore appropriate rx at this point >>>  stiolto daily

## 2022-12-09 NOTE — Assessment & Plan Note (Addendum)
Referred to [facility]  12/08/2022   -   F/u q 6 m, sooner if needed         Each maintenance medication was reviewed in detail including emphasizing most importantly the difference between maintenance and prns and under what circumstances the prns are to be triggered using an action plan format where appropriate.  Total time for H and P, chart review, counseling, reviewing hfa/smi/pulse ox/ flutter  device(s) and generating customized AVS unique to this office visit / same day charting = 24 min

## 2022-12-09 NOTE — Assessment & Plan Note (Signed)
Low-dose CT lung cancer screening is recommended for patients who are 13-71 years of age with a 20+ pack-year history of smoking and who are currently smoking or quit <=15 years ago. No coughing up blood  No unintentional weight loss of > 15 pounds in the last 6 months - pt is eligible for scanning yearly until 2028 > referred to [facility] program  Discussed in detail all the  indications, usual  risks and alternatives  relative to the benefits with patient who agrees to proceed with w/u as outlined.

## 2022-12-09 NOTE — Assessment & Plan Note (Signed)
Dx 2017 SATURATION QUALIFICATIONS:  11/19/2015  Patient Saturations on Room Air at Rest = 96% Patient Saturations on Room Air while Ambulating = 86% Patient Saturations on 2  Liters of oxygen while Ambulating = 96% - 05/16/2016 :   Saturations on Room Air at Rest = 88%--- increased to 99% on 2lpm continuous with activity   -  07/28/2019   Walked RA  2 laps @ approx 284ft each @ avg pace  stopped due to  sob with sats still 91%  - 09/28/2020   Walked on RA x  2  lap(s) =  approx 500 @ avg pace,  with lowest 02 sats 91% and no sob   Reminded: Make sure you check your oxygen saturation  AT  your highest level of activity (not after you stop)   to be sure it stays over 90% and pace yourself slower or  supplement with  02 as needed  to maintain this level if needed but remember to turn it back to previous settings when you stop (to conserve your supply).

## 2022-12-29 ENCOUNTER — Other Ambulatory Visit: Payer: Self-pay

## 2022-12-29 DIAGNOSIS — Z122 Encounter for screening for malignant neoplasm of respiratory organs: Secondary | ICD-10-CM

## 2022-12-29 DIAGNOSIS — Z87891 Personal history of nicotine dependence: Secondary | ICD-10-CM

## 2023-01-02 ENCOUNTER — Ambulatory Visit (INDEPENDENT_AMBULATORY_CARE_PROVIDER_SITE_OTHER): Payer: Medicare Other | Admitting: Otolaryngology

## 2023-01-02 ENCOUNTER — Encounter (INDEPENDENT_AMBULATORY_CARE_PROVIDER_SITE_OTHER): Payer: Self-pay

## 2023-01-02 VITALS — Ht 69.0 in | Wt 173.0 lb

## 2023-01-02 DIAGNOSIS — R0982 Postnasal drip: Secondary | ICD-10-CM

## 2023-01-02 DIAGNOSIS — R0989 Other specified symptoms and signs involving the circulatory and respiratory systems: Secondary | ICD-10-CM | POA: Diagnosis not present

## 2023-01-02 DIAGNOSIS — K219 Gastro-esophageal reflux disease without esophagitis: Secondary | ICD-10-CM

## 2023-01-02 MED ORDER — IPRATROPIUM BROMIDE 0.03 % NA SOLN: 2.0000 | Freq: Two times a day (BID) | NASAL | 5 refills | Status: AC

## 2023-01-02 NOTE — Progress Notes (Signed)
Dear Dr. Sherene Sires, Here is my assessment for our mutual patient, Elijah Marshall. Thank you for allowing me the opportunity to care for your patient. Please do not hesitate to contact me should you have any other questions. Sincerely, Dr. Jovita Kussmaul  Otolaryngology Clinic Note  HISTORY: Elijah Marshall is a 72 y.o. male kindly referred by Dr. Sherene Sires for evaluation of post nasal drip.   He reports that he has some phlegm in the back of the throat, but denies post nasal drip. He saw Dr. Sherene Sires for it in Nov 2024, and referred here. He reports that symptoms occur first thing in the morning after wakeup, and through the night. Has been ongoing for about a year. No pain/pressure in face, discolored drainage, and sense of smell changes. Does not blow his nose much. He does clear his throat a fair amount. He does not feel like there is a lump in his throat. Swallowing no problem, voice is normal, no odynophagia. Improvement occurred with astelin and fluticasone and those help. Astelin helps him the most. Mucinex does not help much. Allergy testing has not been done. No typical AR symptoms. No previous sinonasal surgery.  He is currently using astelin and flonase. He is on rescue and regular scheduled inhaler for his COPD Quit smoking in 2013  GERD: takes famotidine and PPI. No reflux symptoms  AP/AC: no H&N Surgery: denies  PMHx: Lung cancer s/p segmentectomy, COPD, GERD, HTN No heart attacks or strokes.  RADIOGRAPHIC EVALUATION AND INDEPENDENT REVIEW OF OTHER RECORDS:: Dr. Thurston Hole notes reviewed and referral notes reviewed CBC, CMP reviewed - eos - 200 (2024) CT Face 2022 (limited): no significant sinonasal disease CT CHest 12/2021: no sign pulm nodules, airway appears patent  Past Medical History:  Diagnosis Date   Allergy    Arthritis    BPH with obstruction/lower urinary tract symptoms 2016   Nocturia   Cancer (HCC)    unsure at this time   Cancer of lower lobe of right lung (HCC)    COPD  (chronic obstructive pulmonary disease) (HCC)    per patient's health survey - he put a ?   Depression    Dysrhythmia    Erectile dysfunction due to arterial insufficiency    GERD (gastroesophageal reflux disease)    Hyperlipidemia    Hypertension    Non-small cell carcinoma of lung, right (HCC) 06/24/2015   Pneumonia    Shortness of breath    Ulcer    Past Surgical History:  Procedure Laterality Date   ANTERIOR FUSION LUMBAR SPINE  07/25/2012   ANTERIOR LAT LUMBAR FUSION Left 07/25/2012   Procedure: ANTERIOR LATERAL LUMBAR FUSION 1 LEVEL;  Surgeon: Emilee Hero, MD;  Location: [facility] OR;  Service: Orthopedics;  Laterality: Left;  Left sided lumbar 3-4 lateral interbody fusion with allograft and instrumentation   BACK SURGERY     BIOPSY  04/16/2020   Procedure: BIOPSY;  Surgeon: Jeani Hawking, MD;  Location: [facility];  Service: Endoscopy;;   COLONOSCOPY  02/07/2012   polyps rem   ESOPHAGOGASTRODUODENOSCOPY N/A 07/27/2020   Procedure: ESOPHAGOGASTRODUODENOSCOPY (EGD);  Surgeon: Jeani Hawking, MD;  Location: [facility] OR;  Service: Endoscopy;  Laterality: N/A;   ESOPHAGOGASTRODUODENOSCOPY (EGD) WITH PROPOFOL N/A 04/16/2020   Procedure: ESOPHAGOGASTRODUODENOSCOPY (EGD) WITH PROPOFOL;  Surgeon: Jeani Hawking, MD;  Location: [facility];  Service: Endoscopy;  Laterality: N/A;   JOINT REPLACEMENT Right 02/07/2008   right knee   NODE DISSECTION Right 05/24/2015   Procedure: NODE DISSECTION;  Surgeon: Loreli Slot, MD;  Location: [facility] OR;  Service: Thoracic;  Laterality: Right;   SEGMENTECOMY Right 05/24/2015   Procedure: RIGHT LOWER LOBE SUPERIOR SEGMENTECTOMY;  Surgeon: Loreli Slot, MD;  Location[facility] OR;  Service: Thoracic;  Laterality: Right;   SPINE SURGERY  02/06/2010   SUBMUCOSAL TATTOO INJECTION  04/16/2020   Procedure: SUBMUCOSAL TATTOO INJECTION;  Surgeon: Jeani Hawking, MD;  Location: [facility];  Service: Endoscopy;;   UPPER ESOPHAGEAL ENDOSCOPIC ULTRASOUND  (EUS) N/A 04/16/2020   Procedure: UPPER ESOPHAGEAL ENDOSCOPIC ULTRASOUND (EUS);  Surgeon: Jeani Hawking, MD;  Location: [facility];  Service: Endoscopy;  Laterality: N/A;   VIDEO ASSISTED THORACOSCOPY (VATS)/WEDGE RESECTION Right 05/24/2015   Procedure: RIGHT VIDEO ASSISTED THORACOSCOPY (VATS)/ RIGHTR LOWER LOBE WEDGE RESECTION;  Surgeon: Loreli Slot, MD;  Location: [facility] OR;  Service: Thoracic;  Laterality: Right;   Family History  Problem Relation Age of Onset   Colon cancer Mother    Heart disease Father    Diabetes Sister    Hypertension Sister    Cancer Sister    Hypertension Brother    Heart disease Maternal Grandmother        heart attack   Social History   Tobacco Use   Smoking status: Former    Current packs/day: 0.00    Average packs/day: 1 pack/day for 35.0 years (35.0 ttl pk-yrs)    Types: Cigarettes    Start date: 09/23/1976    Quit date: 09/24/2011    Years since quitting: 11.2   Smokeless tobacco: Never  Substance Use Topics   Alcohol use: Not Currently    Comment: very seldom   Allergies  Allergen Reactions   Aspirin Nausea And Vomiting   Morphine Itching   Morphine And Codeine Itching   Current Outpatient Medications  Medication Sig Dispense Refill   albuterol (VENTOLIN HFA) 108 (90 Base) MCG/ACT inhaler Inhale 2 puffs into the lungs every 6 (six) hours as needed for wheezing or shortness of breath. 1 each 6   atorvastatin (LIPITOR) 40 MG tablet TAKE 1 TABLET BY MOUTH EVERY DAY (Patient taking differently: Take 40 mg by mouth daily.) 90 tablet 0   azelastine (ASTELIN) 0.1 % nasal spray Place 1 spray into both nostrils 2 (two) times daily. Use in each nostril as directed 30 mL 11   baclofen (LIORESAL) 10 MG tablet Take 10 mg by mouth daily as needed for muscle spasms.     Brimonidine Tartrate (LUMIFY) 0.025 % SOLN Apply 1 drop to eye daily.     cyclobenzaprine (FLEXERIL) 10 MG tablet Take 1 tablet (10 mg total) by mouth 2 (two) times daily as needed  for muscle spasms. 20 tablet 0   famotidine (PEPCID) 40 MG tablet Take 1 tablet (40 mg total) by mouth 2 (two) times daily. (Patient taking differently: Take 40 mg by mouth daily as needed for heartburn.) 180 tablet 3   fluticasone (FLONASE) 50 MCG/ACT nasal spray SPRAY 1 SPRAY INTO BOTH NOSTRILS DAILY. 16 mL 11   hydrochlorothiazide (HYDRODIURIL) 25 MG tablet Take 25 mg by mouth daily.     HYDROcodone-acetaminophen (NORCO/VICODIN) 5-325 MG tablet Take 1-2 tablets by mouth every 4 (four) hours as needed for moderate pain or severe pain. 40 tablet 0   ipratropium (ATROVENT) 0.03 % nasal spray Place 2 sprays into both nostrils every 12 (twelve) hours. 30 mL 5   lansoprazole (PREVACID) 30 MG capsule TAKE 1 CAPSULE (30 MG TOTAL) BY MOUTH 2 (TWO) TIMES DAILY. (Patient taking differently: Take 30 mg by mouth 2 (two) times  daily before a meal.) 180 capsule 1   levocetirizine (XYZAL) 5 MG tablet Take 5 mg by mouth every evening.     metoprolol succinate (TOPROL-XL) 50 MG 24 hr tablet TAKE 1 TABLET BY MOUTH DAILY. TAKE WITH OR IMMEDIATELY FOLLOWING A MEAL. 90 tablet 3   montelukast (SINGULAIR) 10 MG tablet Take 10 mg by mouth daily.     Multiple Vitamin (MULTIVITAMIN ADULT PO) Take by mouth.     OXYGEN Inhale 2 L into the lungs daily as needed (with exertion only).     polyethylene glycol (MIRALAX / GLYCOLAX) 17 g packet Take 17 g by mouth daily.     sildenafil (VIAGRA) 100 MG tablet Take 100 mg by mouth daily as needed for erectile dysfunction.     Tiotropium Bromide-Olodaterol (STIOLTO RESPIMAT) 2.5-2.5 MCG/ACT AERS INHALE 2 PUFFS BY MOUTH INTO THE LUNGS DAILY 4 g 5   Tiotropium Bromide-Olodaterol (STIOLTO RESPIMAT) 2.5-2.5 MCG/ACT AERS Inhale 2 puffs into the lungs daily.     Vitamin D, Ergocalciferol, (DRISDOL) 1.25 MG (50000 UNIT) CAPS capsule Take 50,000 Units by mouth once a week.     No current facility-administered medications for this visit.   Ht 5\' 9"  (1.753 m)   Wt 173 lb (78.5 kg)   BMI  25.55 kg/m   PHYSICAL EXAM:  Ht 5\' 9"  (1.753 m)   Wt 173 lb (78.5 kg)   BMI 25.55 kg/m    Salient findings:  CN II-XII intact  Bilateral EAC clear and TM intact with well pneumatized middle ear spaces Nose: Anterior rhinoscopy reveals modest right septal deviation, no significant b/l inferior turbinate hypertrophy No lesions of oral cavity/oropharynx No obviously palpable neck masses/lymphadenopathy/thyromegaly No respiratory distress or stridor; voice quality class 1.5, easily tolerates secretions; given nature of complaints and history, TFL was indicated and performed below   PROCEDURE: Procedure Note Pre-procedure diagnosis: throat clearing, rhinorrhea, GERD Post-procedure diagnosis: Same Procedure: Transnasal Fiberoptic Laryngoscopy, CPT 16109 - Mod 25 Indication: throat clearing, rhinorrhea, GERD Complications: None apparent EBL: 0 mL Date: 01/05/23   The procedure was undertaken to further evaluate the patient's complaint of throat clearing, rhinorrhea, with mirror exam inadequate for appropriate examination due to gag reflex and poor patient tolerance  Procedure:  Patient was identified as correct patient. Verbal consent was obtained. The nose was sprayed with oxymetazoline and 4% lidocaine. The The flexible laryngoscope was passed through the nose to view the nasal cavity, pharynx (oropharynx, hypopharynx) and larynx.  The larynx was examined at rest and during multiple phonatory tasks. Documentation was obtained and reviewed with patient. The scope was removed. The patient tolerated the procedure well.  Findings: The nasal cavity and nasopharynx did not reveal any masses or lesions, mucosa appeared to be without obvious lesions. The tongue base, pharyngeal walls, piriform sinuses, vallecula, epiglottis and postcricoid region are normal in appearance without significant post-cricoid edema,With mild retained secretions. The visualized portion of the subglottis and proximal  trachea is widely patent. The vocal folds are mobile bilaterally. There are no lesions on the free edge of the vocal folds nor elsewhere in the larynx worrisome for malignancy.      Electronically signed by: Read Drivers, MD 01/05/2023 12:52 PM  ASSESSMENT:  72 yo M with history of lung cancer now with: 1. Post-nasal drip   2. Chronic throat clearing   3. Gastroesophageal reflux disease, unspecified whether esophagitis present   TFL is reassuring He is already being treated for GERD/LPR. We discussed nature of throat clearing and retained  secretions. Most likely etiologies include LPR and allergies, and given improvement with flonase and astelin, will continue those and add ipratropium as needed  PLAN: We've discussed issues and options today.  We reviewed the TFL images together.  The risks, benefits and alternatives were discussed and questions answered.  He has elected to proceed with:  1) Continue flonase and astelin 2) Ipratropium 0.03% BID PRN 2) discussed f/u, opted for PRN  See below regarding exact medications prescribed this encounter including dosages and route: Meds ordered this encounter  Medications   ipratropium (ATROVENT) 0.03 % nasal spray    Sig: Place 2 sprays into both nostrils every 12 (twelve) hours.    Dispense:  30 mL    Refill:  5     Thank you for allowing me the opportunity to care for your patient. Please do not hesitate to contact me should you have any other questions.  Sincerely, Jovita Kussmaul, MD Otolarynoglogist (ENT), [facility] Phone: [phone] Fax: [phone]  MDM:  Level 4: [phone] Complexity/Problems addressed: multiple chronic problems Data complexity: mod - independent interpretation of CT - Morbidity: mod  - Prescription Drug prescribed or managed: yes  01/05/2023, 12:52 PM

## 2023-01-08 ENCOUNTER — Ambulatory Visit (INDEPENDENT_AMBULATORY_CARE_PROVIDER_SITE_OTHER): Payer: Medicare Other | Admitting: Acute Care

## 2023-01-08 DIAGNOSIS — Z87891 Personal history of nicotine dependence: Secondary | ICD-10-CM

## 2023-01-08 NOTE — Progress Notes (Signed)
 Virtual Visit via Telephone Note  I connected with Elijah Marshall on 01/08/23 at  3:00 PM EST by telephone and verified that I am speaking with the correct person using two identifiers.  Location: Patient: in home Provider: [address] [place], [place], [address]     Shared Decision Making Visit Lung Cancer Screening Program [phone])   Eligibility: Age 72 y.o. Pack Years Smoking History Calculation 42 (# packs/per year x # years smoked) Recent History of coughing up blood  no Unexplained weight loss? no ( >Than 15 pounds within the last 6 months ) Prior History Lung / other cancer greater than 5 years (Diagnosis within the last 5 years already requiring surveillance chest CT Scans). Smoking Status Former Smoker Former Smokers: Years since quit: 11 years  Quit Date: 09-24-11  Visit Components: Discussion included one or more decision making aids. yes Discussion included risk/benefits of screening. yes Discussion included potential follow up diagnostic testing for abnormal scans. yes Discussion included meaning and risk of over diagnosis. yes Discussion included meaning and risk of False Positives. yes Discussion included meaning of total radiation exposure. yes  Counseling Included: Importance of adherence to annual lung cancer LDCT screening. yes Impact of comorbidities on ability to participate in the program. yes Ability and willingness to under diagnostic treatment. yes  Smoking Cessation Counseling: Current Smokers:  Discussed importance of smoking cessation. yes Information about tobacco cessation classes and interventions provided to patient. yes Patient provided with "ticket" for LDCT Scan. yes Symptomatic Patient. no  Counseling NA Diagnosis Code: Tobacco Use Z72.0 Asymptomatic Patient yes  Counseling (Intermediate counseling: > three minutes counseling) U0454 Former Smokers:  Discussed the importance of maintaining cigarette abstinence.  yes Diagnosis Code: Personal History of Nicotine Dependence. U98.119 Information about tobacco cessation classes and interventions provided to patient. Yes Patient provided with "ticket" for LDCT Scan. yes Written Order for Lung Cancer Screening with LDCT placed in Epic. Yes (CT Chest Lung Cancer Screening Low Dose W/O CM) [id number] Z12.2-Screening of respiratory organs Z87.891-Personal history of nicotine dependence   Karl Bales, RN 01/08/23

## 2023-01-08 NOTE — Patient Instructions (Signed)

## 2023-01-11 ENCOUNTER — Ambulatory Visit (HOSPITAL_COMMUNITY)
Admission: RE | Admit: 2023-01-11 | Discharge: 2023-01-11 | Disposition: A | Discharge status: Home / Self Care | Payer: Medicare Other | Source: Ambulatory Visit | Attending: Adult Medicine | Admitting: Adult Medicine

## 2023-01-11 DIAGNOSIS — Z122 Encounter for screening for malignant neoplasm of respiratory organs: Secondary | ICD-10-CM

## 2023-01-11 DIAGNOSIS — Z87891 Personal history of nicotine dependence: Secondary | ICD-10-CM

## 2023-01-25 ENCOUNTER — Other Ambulatory Visit: Payer: Self-pay | Admitting: Acute Care

## 2023-01-25 DIAGNOSIS — Z87891 Personal history of nicotine dependence: Secondary | ICD-10-CM

## 2023-01-25 DIAGNOSIS — Z122 Encounter for screening for malignant neoplasm of respiratory organs: Secondary | ICD-10-CM

## 2023-01-29 ENCOUNTER — Telehealth: Payer: Self-pay | Admitting: Internal Medicine

## 2023-01-29 NOTE — Telephone Encounter (Signed)
PT would like Stiolto samples to last until months end. He is in the doughnut hole. His # is [phone]

## 2023-02-08 NOTE — Telephone Encounter (Signed)
 He can have 2 but this won't do longterm may need to make appt to see NP for cheaper alternatives or see me if available

## 2023-02-08 NOTE — Telephone Encounter (Signed)
 We have samples of Stiolto. Do you approve of giving samples? If so, how many to you want the pt to have? Please advise.

## 2023-02-08 NOTE — Telephone Encounter (Signed)
 Patient states has $300.00 deductible for medicine. Would like samples of Stiolto. Patient out of medication. Patient phone number is [phone].

## 2023-02-09 NOTE — Telephone Encounter (Signed)
 Patient is calling for an update on samples. Will schedule him a follow up appointment.

## 2023-02-12 MED ORDER — STIOLTO RESPIMAT 2.5-2.5 MCG/ACT IN AERS: 2.0000 | INHALATION_SPRAY | Freq: Every day | RESPIRATORY_TRACT | Status: DC

## 2023-02-12 NOTE — Telephone Encounter (Signed)
 2 samples Stiolto provided and placed @front  desk. Patient notified.

## 2023-02-12 NOTE — Addendum Note (Signed)
 Addended by: Janean Sark on: 02/12/2023 01:19 PM   Modules accepted: Orders

## 2023-03-09 ENCOUNTER — Encounter: Payer: Self-pay | Admitting: Adult Health

## 2023-03-09 ENCOUNTER — Ambulatory Visit: Payer: Medicare Other | Admitting: Adult Health

## 2023-03-09 VITALS — BP 139/93 | HR 95 | Temp 97.3°F | Ht 69.0 in | Wt 172.4 lb

## 2023-03-09 DIAGNOSIS — J310 Chronic rhinitis: Secondary | ICD-10-CM | POA: Diagnosis not present

## 2023-03-09 DIAGNOSIS — J9611 Chronic respiratory failure with hypoxia: Secondary | ICD-10-CM

## 2023-03-09 DIAGNOSIS — C3491 Malignant neoplasm of unspecified part of right bronchus or lung: Secondary | ICD-10-CM

## 2023-03-09 DIAGNOSIS — J449 Chronic obstructive pulmonary disease, unspecified: Secondary | ICD-10-CM

## 2023-03-09 MED ORDER — STIOLTO RESPIMAT 2.5-2.5 MCG/ACT IN AERS: 2.0000 | INHALATION_SPRAY | Freq: Every day | RESPIRATORY_TRACT | 5 refills | Status: DC

## 2023-03-09 NOTE — Patient Instructions (Addendum)
Continue on Stiolto 2 puffs daily  Continue on Oxygen 2l/m with activity as needed -goal is to have Oxygen level >88-90%.  Activity as tolerated.  Saline nasal rinses Twice daily   Saline nasal gel At bedtime   Flonase nasal daily As needed   Astelin nasal spray As needed   Singulair 10mg  At bedtime   Xyzal 5mg  At bedtime  As needed   Liquid Robitussin or Mucinex DM Twice daily  As needed  cough/congestion  Add Flutter valve Twice daily   Continue with yearly Lung cancer CT chest screening program.  Follow up with Dr. Sherene Sires  in 6 month and As needed  -PFT  Please contact office for sooner follow up if symptoms do not improve or worsen or seek emergency care

## 2023-03-09 NOTE — Assessment & Plan Note (Signed)
History of lung cancer.  CT chest December 2024 showed no suspicious nodules.  Continue with lung cancer CT screening program

## 2023-03-09 NOTE — Assessment & Plan Note (Signed)
Appears compensated on Stiolto.  Continue current regimen.  Continue with yearly lung cancer CT screening program.  Immunizations appear to be up-to-date. Check PFTs on return  Plan  Patient Instructions  Continue on Stiolto 2 puffs daily  Continue on Oxygen 2l/m with activity as needed -goal is to have Oxygen level >88-90%.  Activity as tolerated.  Saline nasal rinses Twice daily   Saline nasal gel At bedtime   Flonase nasal daily As needed   Astelin nasal spray As needed   Singulair 10mg  At bedtime   Xyzal 5mg  At bedtime  As needed   Liquid Robitussin or Mucinex DM Twice daily  As needed  cough/congestion  Add Flutter valve Twice daily   Continue with yearly Lung cancer CT chest screening program.  Follow up with Dr. Sherene Sires  in 6 month and As needed  -PFT  Please contact office for sooner follow up if symptoms do not improve or worsen or seek emergency care

## 2023-03-09 NOTE — Assessment & Plan Note (Signed)
Continue on current regimen.  ENT notes were reviewed.  Plan  Patient Instructions  Continue on Stiolto 2 puffs daily  Continue on Oxygen 2l/m with activity as needed -goal is to have Oxygen level >88-90%.  Activity as tolerated.  Saline nasal rinses Twice daily   Saline nasal gel At bedtime   Flonase nasal daily As needed   Astelin nasal spray As needed   Singulair 10mg  At bedtime   Xyzal 5mg  At bedtime  As needed   Liquid Robitussin or Mucinex DM Twice daily  As needed  cough/congestion  Add Flutter valve Twice daily   Continue with yearly Lung cancer CT chest screening program.  Follow up with Dr. Sherene Sires  in 6 month and As needed  -PFT  Please contact office for sooner follow up if symptoms do not improve or worsen or seek emergency care

## 2023-03-09 NOTE — Progress Notes (Signed)
@Patient  ID: Elijah Marshall, male    DOB: April 26, 1950, 73 y.o.   MRN: [id number]  Chief Complaint  Patient presents with   Follow-up    Referring provider: Eliezer Lofts, MD  HPI: 73 year old male former smoker followed for severe COPD and chronic respiratory failure on oxygen 2 L with activity History of stage Ia non-small cell carcinoma of the right lung s/p resection in 2017  TEST/EVENTS :  CT chest December 20, 2020 no evidence of recurrent or metastatic disease, bullous emphysema,  CT chest January 11, 2023 emphysema, tiny bilateral pulmonary nodules measuring up to 4.2 mm are stable.  No suspicious pulmonary nodules.   Spirometry  03/01/12  FEV1  1.63 - Spirometry   09/05/13 FEV1  1.34 (46%) ratio 44  - PFTs   10/29/2013    FEV1    1.70 (62%) ratio 50% no better after ssaba and DLCO 48%   PFT's  05/22/2017  FEV1 1.21  (46 % ) ratio 46  p 1 % improvement from saba p anoro and albuterol 1 h prior to study with DLCO  38 % corrects to 49 % for alv volume   Allergy profile 05/16/2016 >  Eos 0. 1/  IgE  14 neg RAST   03/09/2023 Follow up : COPD , O2 RF , AR  Patient presents for 51-month follow-up.  Patient has a history of severe COPD, chronic respiratory failure, stage Ia non-small cell carcinoma of the right lung status post resection 2017.  Since last visit patient says overall his breathing is doing okay.  He denies any flare of cough or wheezing.  Activity is at baseline.  No increased oxygen demands.  Uses oxygen 2 L with activity Patient has a history of stage Ia non-small cell carcinoma of the right lung status postresection 2017.  Patient now participates in the lung cancer CT screening program.  CT chest December 2024 showed emphysema.  Stable tiny pulmonary nodules.  No suspicious pulmonary nodules were noted. Last visit was having increased nasal congestion postnasal drainage and daily congestion that seem to be worse in the morning.  Patient was seen to ENT.  Care everywhere  notes showed patient was seen by ENT on January 02, 2023 with laryngoscopy-was unremarkable, patient was recommended to continue on chronic rhinitis and GERD treatment.  Was given Atrovent nasal spray to use as needed. Patient says he is not as active as he would like.  Is somewhat sedentary.  He denies any chest pain orthopnea PND or increased leg swelling.      Allergies  Allergen Reactions   Aspirin Nausea And Vomiting   Morphine Itching   Morphine And Codeine Itching    Immunization History  Administered Date(s) Administered   Fluad Quad(high Dose 65+) 11/07/2018, 01/14/2021, 12/06/2021   Influenza, High Dose Seasonal PF 11/21/2016, 11/16/2017   Influenza,inj,Quad PF,6+ Mos 01/15/2015   Influenza-Unspecified 11/02/2015   PFIZER(Purple Top)SARS-COV-2 Vaccination 03/20/2019   Pneumococcal Conjugate-13 04/17/2017, 09/06/2018   Pneumococcal Polysaccharide-23 07/26/2012   Tdap 09/06/2018   Zoster Recombinant(Shingrix) 09/06/2018, 11/07/2018    Past Medical History:  Diagnosis Date   Allergy    Arthritis    BPH with obstruction/lower urinary tract symptoms 2016   Nocturia   Cancer (HCC)    unsure at this time   Cancer of lower lobe of right lung (HCC)    COPD (chronic obstructive pulmonary disease) (HCC)    per patient's health survey - he put a ?   Depression  Dysrhythmia    Erectile dysfunction due to arterial insufficiency    GERD (gastroesophageal reflux disease)    Hyperlipidemia    Hypertension    Non-small cell carcinoma of lung, right (HCC) 06/24/2015   Pneumonia    Shortness of breath    Ulcer     Tobacco History: Social History   Tobacco Use  Smoking Status Former   Current packs/day: 0.00   Average packs/day: 1 pack/day for 35.0 years (35.0 ttl pk-yrs)   Types: Cigarettes   Start date: 09/23/1976   Quit date: 09/24/2011   Years since quitting: 11.4  Smokeless Tobacco Never   Counseling given: Not Answered   Outpatient Medications Prior to  Visit  Medication Sig Dispense Refill   albuterol (VENTOLIN HFA) 108 (90 Base) MCG/ACT inhaler Inhale 2 puffs into the lungs every 6 (six) hours as needed for wheezing or shortness of breath. 1 each 6   atorvastatin (LIPITOR) 40 MG tablet TAKE 1 TABLET BY MOUTH EVERY DAY (Patient taking differently: Take 40 mg by mouth daily.) 90 tablet 0   azelastine (ASTELIN) 0.1 % nasal spray Place 1 spray into both nostrils 2 (two) times daily. Use in each nostril as directed 30 mL 11   baclofen (LIORESAL) 10 MG tablet Take 10 mg by mouth daily as needed for muscle spasms.     Brimonidine Tartrate (LUMIFY) 0.025 % SOLN Apply 1 drop to eye daily.     cyclobenzaprine (FLEXERIL) 10 MG tablet Take 1 tablet (10 mg total) by mouth 2 (two) times daily as needed for muscle spasms. 20 tablet 0   famotidine (PEPCID) 40 MG tablet Take 1 tablet (40 mg total) by mouth 2 (two) times daily. (Patient taking differently: Take 40 mg by mouth daily as needed for heartburn.) 180 tablet 3   fluticasone (FLONASE) 50 MCG/ACT nasal spray SPRAY 1 SPRAY INTO BOTH NOSTRILS DAILY. 16 mL 11   hydrochlorothiazide (HYDRODIURIL) 25 MG tablet Take 25 mg by mouth daily.     HYDROcodone-acetaminophen (NORCO/VICODIN) 5-325 MG tablet Take 1-2 tablets by mouth every 4 (four) hours as needed for moderate pain or severe pain. 40 tablet 0   ipratropium (ATROVENT) 0.03 % nasal spray Place 2 sprays into both nostrils every 12 (twelve) hours. 30 mL 5   lansoprazole (PREVACID) 30 MG capsule TAKE 1 CAPSULE (30 MG TOTAL) BY MOUTH 2 (TWO) TIMES DAILY. (Patient taking differently: Take 30 mg by mouth 2 (two) times daily before a meal.) 180 capsule 1   levocetirizine (XYZAL) 5 MG tablet Take 5 mg by mouth every evening.     metoprolol succinate (TOPROL-XL) 50 MG 24 hr tablet TAKE 1 TABLET BY MOUTH DAILY. TAKE WITH OR IMMEDIATELY FOLLOWING A MEAL. 90 tablet 3   montelukast (SINGULAIR) 10 MG tablet Take 10 mg by mouth daily.     Multiple Vitamin (MULTIVITAMIN  ADULT PO) Take by mouth.     OXYGEN Inhale 2 L into the lungs daily as needed (with exertion only).     polyethylene glycol (MIRALAX / GLYCOLAX) 17 g packet Take 17 g by mouth daily.     sildenafil (VIAGRA) 100 MG tablet Take 100 mg by mouth daily as needed for erectile dysfunction.     Vitamin D, Ergocalciferol, (DRISDOL) 1.25 MG (50000 UNIT) CAPS capsule Take 50,000 Units by mouth once a week.     Tiotropium Bromide-Olodaterol (STIOLTO RESPIMAT) 2.5-2.5 MCG/ACT AERS INHALE 2 PUFFS BY MOUTH INTO THE LUNGS DAILY 4 g 5   Tiotropium Bromide-Olodaterol (STIOLTO RESPIMAT) 2.5-2.5  MCG/ACT AERS Inhale 2 puffs into the lungs daily.     Tiotropium Bromide-Olodaterol (STIOLTO RESPIMAT) 2.5-2.5 MCG/ACT AERS Inhale 2 puffs into the lungs daily at 6 (six) AM.     No facility-administered medications prior to visit.     Review of Systems:   Constitutional:   No  weight loss, night sweats,  Fevers, chills, fatigue, or  lassitude.  HEENT:   No headaches,  Difficulty swallowing,  Tooth/dental problems, or  Sore throat,                No sneezing, itching, ear ache,  +nasal congestion, post nasal drip,   CV:  No chest pain,  Orthopnea, PND, swelling in lower extremities, anasarca, dizziness, palpitations, syncope.   GI  No heartburn, indigestion, abdominal pain, nausea, vomiting, diarrhea, change in bowel habits, loss of appetite, bloody stools.   Resp:   No chest wall deformity  Skin: no rash or lesions.  GU: no dysuria, change in color of urine, no urgency or frequency.  No flank pain, no hematuria   MS:  No joint pain or swelling.  No decreased range of motion.  No back pain.    Physical Exam  BP (!) 139/93 (BP Location: Left Arm, Patient Position: Sitting, Cuff Size: Large)   Pulse 95   Temp (!) 97.3 F (36.3 C) (Temporal)   Ht 5\' 9"  (1.753 m)   Wt 172 lb 6.4 oz (78.2 kg)   SpO2 90%   BMI 25.46 kg/m   GEN: A/Ox3; pleasant , NAD, well nourished    HEENT:  [place]/AT,  NOSE-clear,  THROAT-clear, no lesions, no postnasal drip or exudate noted.   NECK:  Supple w/ fair ROM; no JVD; normal carotid impulses w/o bruits; no thyromegaly or nodules palpated; no lymphadenopathy.    RESP  Clear  P & A; w/o, wheezes/ rales/ or rhonchi. no accessory muscle use, no dullness to percussion  CARD:  RRR, no m/r/g, no peripheral edema, pulses intact, no cyanosis or clubbing.  GI:   Soft & nt; nml bowel sounds; no organomegaly or masses detected.   Musco: Warm bil, no deformities or joint swelling noted.   Neuro: alert, no focal deficits noted.    Skin: Warm, no lesions or rashes    Lab Results:  CBC       ProBNP No results found for: "PROBNP"  Imaging: No results found.  Administration History     None          Latest Ref Rng & Units 05/22/2017    2:01 PM 01/10/2016    1:47 PM 10/29/2013   12:05 PM  PFT Results  FVC-Pre L 2.57  2.90  3.43   FVC-Predicted Pre % 74  83  97   FVC-Post L 2.66  2.91  3.39   FVC-Predicted Post % 76  83  96   Pre FEV1/FVC % % 46  47  50   Post FEV1/FCV % % 46  47  51   FEV1-Pre L 1.19  1.36  1.70   FEV1-Predicted Pre % 45  51  62   FEV1-Post L 1.21  1.38  1.71   DLCO uncorrected ml/min/mmHg 10.88  12.41  13.77   DLCO UNC% % 38  43  48   DLCO corrected ml/min/mmHg  11.69    DLCO COR %Predicted %  41    DLVA Predicted % 49  47  53   TLC L 6.83  6.87  5.25   TLC %  Predicted % 106  107  82   RV % Predicted % 162  152  62     No results found for: "NITRICOXIDE"      Assessment & Plan:   COPD GOLD III Appears compensated on Stiolto.  Continue current regimen.  Continue with yearly lung cancer CT screening program.  Immunizations appear to be up-to-date. Check PFTs on return  Plan  Patient Instructions  Continue on Stiolto 2 puffs daily  Continue on Oxygen 2l/m with activity as needed -goal is to have Oxygen level >88-90%.  Activity as tolerated.  Saline nasal rinses Twice daily   Saline nasal gel At bedtime    Flonase nasal daily As needed   Astelin nasal spray As needed   Singulair 10mg  At bedtime   Xyzal 5mg  At bedtime  As needed   Liquid Robitussin or Mucinex DM Twice daily  As needed  cough/congestion  Add Flutter valve Twice daily   Continue with yearly Lung cancer CT chest screening program.  Follow up with Dr. Sherene Sires  in 6 month and As needed  -PFT  Please contact office for sooner follow up if symptoms do not improve or worsen or seek emergency care         Chronic rhinitis Continue on current regimen.  ENT notes were reviewed.  Plan  Patient Instructions  Continue on Stiolto 2 puffs daily  Continue on Oxygen 2l/m with activity as needed -goal is to have Oxygen level >88-90%.  Activity as tolerated.  Saline nasal rinses Twice daily   Saline nasal gel At bedtime   Flonase nasal daily As needed   Astelin nasal spray As needed   Singulair 10mg  At bedtime   Xyzal 5mg  At bedtime  As needed   Liquid Robitussin or Mucinex DM Twice daily  As needed  cough/congestion  Add Flutter valve Twice daily   Continue with yearly Lung cancer CT chest screening program.  Follow up with Dr. Sherene Sires  in 6 month and As needed  -PFT  Please contact office for sooner follow up if symptoms do not improve or worsen or seek emergency care         Chronic respiratory failure with hypoxia (HCC) Continue on oxygen 2 L with activity to maintain O2 saturations greater than 88 to 90%  Non-small cell carcinoma of lung, right (HCC) History of lung cancer.  CT chest December 2024 showed no suspicious nodules.  Continue with lung cancer CT screening program     Rubye Oaks, NP 03/09/2023

## 2023-03-09 NOTE — Assessment & Plan Note (Signed)
Continue on oxygen 2 L with activity to maintain O2 saturations greater than 88 to 90%

## 2023-03-18 ENCOUNTER — Other Ambulatory Visit: Payer: Self-pay | Admitting: Cardiology

## 2023-03-18 DIAGNOSIS — I484 Atypical atrial flutter: Secondary | ICD-10-CM

## 2023-05-02 ENCOUNTER — Ambulatory Visit (INDEPENDENT_AMBULATORY_CARE_PROVIDER_SITE_OTHER)

## 2023-05-02 ENCOUNTER — Encounter: Payer: Self-pay | Admitting: Pulmonary Disease

## 2023-05-02 ENCOUNTER — Ambulatory Visit: Payer: Self-pay

## 2023-05-02 ENCOUNTER — Ambulatory Visit: Admitting: Pulmonary Disease

## 2023-05-02 ENCOUNTER — Other Ambulatory Visit: Payer: Self-pay | Admitting: Pulmonary Disease

## 2023-05-02 VITALS — BP 124/78 | HR 80 | Ht 69.0 in | Wt 171.0 lb

## 2023-05-02 DIAGNOSIS — Z85118 Personal history of other malignant neoplasm of bronchus and lung: Secondary | ICD-10-CM | POA: Diagnosis not present

## 2023-05-02 DIAGNOSIS — Z87891 Personal history of nicotine dependence: Secondary | ICD-10-CM

## 2023-05-02 DIAGNOSIS — R042 Hemoptysis: Secondary | ICD-10-CM

## 2023-05-02 DIAGNOSIS — J441 Chronic obstructive pulmonary disease with (acute) exacerbation: Secondary | ICD-10-CM

## 2023-05-02 DIAGNOSIS — J449 Chronic obstructive pulmonary disease, unspecified: Secondary | ICD-10-CM

## 2023-05-02 DIAGNOSIS — C3491 Malignant neoplasm of unspecified part of right bronchus or lung: Secondary | ICD-10-CM

## 2023-05-02 LAB — CBC WITH DIFFERENTIAL/PLATELET
Basophils Absolute: 0 10*3/uL (ref 0.0–0.1)
Basophils Relative: 0.2 % (ref 0.0–3.0)
Eosinophils Absolute: 0.1 10*3/uL (ref 0.0–0.7)
Eosinophils Relative: 2.1 % (ref 0.0–5.0)
HCT: 52.7 % — ABNORMAL HIGH (ref 39.0–52.0)
Hemoglobin: 17.6 g/dL — ABNORMAL HIGH (ref 13.0–17.0)
Lymphocytes Relative: 29.7 % (ref 12.0–46.0)
Lymphs Abs: 1.6 10*3/uL (ref 0.7–4.0)
MCHC: 33.4 g/dL (ref 30.0–36.0)
MCV: 87.9 fl (ref 78.0–100.0)
Monocytes Absolute: 0.6 10*3/uL (ref 0.1–1.0)
Monocytes Relative: 10.4 % (ref 3.0–12.0)
Neutro Abs: 3.2 10*3/uL (ref 1.4–7.7)
Neutrophils Relative %: 57.6 % (ref 43.0–77.0)
Platelets: 211 10*3/uL (ref 150.0–400.0)
RBC: 5.99 Mil/uL — ABNORMAL HIGH (ref 4.22–5.81)
RDW: 13.4 % (ref 11.5–15.5)
WBC: 5.5 10*3/uL (ref 4.0–10.5)

## 2023-05-02 MED ORDER — AMOXICILLIN-POT CLAVULANATE 875-125 MG PO TABS: 1.0000 | ORAL_TABLET | Freq: Two times a day (BID) | ORAL | 0 refills | Status: DC

## 2023-05-02 NOTE — Telephone Encounter (Signed)
 Pt is scheduled to be seen today. NFN

## 2023-05-02 NOTE — Telephone Encounter (Signed)
 Copied from CRM [id number]. Topic: Clinical - Red Word Triage >> May 02, 2023  8:51 AM Adele Barthel wrote: Red Word that prompted transfer to Nurse Triage:   Spitting up blood, started last night Stopped for a period of time Started spitting up blood this morning.  No cough or other symptoms History of blood clots   Chief Complaint: Coughing Up Blood  Symptoms: Spitting Up Blood  Frequency: Acute  Pertinent Negatives: Patient denies cough, dyspnea  Disposition: [] ED /[] Urgent Care (no appt availability in office) / [x] Appointment(In office/virtual)/ []  [facility]/ [] Home Care/ [] Refused Recommended Disposition /[] [place] [facility]/ []  Follow-up with PCP Additional Notes: EC is being triaged for hemoptysis. The patient uses nasal spray and his prescribed medication. In office appointment made for this morning with Dr. Francine Graven.   The patient has a history of COPD and has had lung cancer in the past.   Reason for Disposition  [1] Has underlying lung disease (e.g., COPD, chronic bronchitis or emphysema) AND [2] sputum has turned yellow or green in color  Answer Assessment - Initial Assessment Questions 1. ONSET: "When did the cough begin?"      Last night  2. SEVERITY: "How bad is the cough today?" "Did the blood appear after a coughing spell?"      Denies having a cough  3. SPUTUM: "Describe the color of your sputum" (none, dry cough; clear, white, yellow, green)     No  4. HEMOPTYSIS: "How much blood?" (flecks, streaks, tablespoons, etc.)      Streaks to a Tablespoon amount  5. DIFFICULTY BREATHING: "Are you having difficulty breathing?" If Yes, ask: "How bad is it?" (e.g., mild, moderate, severe)    - MILD: No SOB at rest, mild SOB with walking, speaks normally in sentences, can lie down, no retractions, pulse < 100.    - MODERATE: SOB at rest, SOB with minimal exertion and prefers to sit, cannot lie down flat, speaks in phrases, mild retractions, audible wheezing,  pulse 100-120.    - SEVERE: Very SOB at rest, speaks in single words, struggling to breathe, sitting hunched forward, retractions, pulse > 120      Baseline Dyspnea  6. FEVER: "Do you have a fever?" If Yes, ask: "What is your temperature, how was it measured, and when did it start?"     No  7. CARDIAC HISTORY: "Do you have any history of heart disease?" (e.g., heart attack, congestive heart failure)      No  8. LUNG HISTORY: "Do you have any history of lung disease?"  (e.g., pulmonary embolus, asthma, emphysema)     COPD, Lung Cancer  9. PE RISK FACTORS: "Do you have a history of blood clots?" (or: recent major surgery, recent prolonged travel, bedridden)     No  10. OTHER SYMPTOMS: "Do you have any other symptoms?" (e.g., runny nose, wheezing, chest pain)       No  12. TRAVEL: "Have you traveled out of the country in the last month?" (e.g., travel history, exposures)       Wife was sick with Bronchitis about 3 weeks ago.  Protocols used: Coughing Up Blood-A-AH

## 2023-05-02 NOTE — Patient Instructions (Addendum)
 Start augmentin 1 tab twice daily  Hold amoxicillin for your tooth infection until done with augmentin  Continue stiolto inhaler 2 puffs daily   Use albuterol inhaler as needed  Chest x-ray shows no new issues  Consider stopping flonase if you develop nose bleeds  We will check a CBC today  Please call us if you continue to cough up blood

## 2023-05-02 NOTE — Progress Notes (Signed)
 Synopsis: Acute visit, hemoptysis. Patient of Dr. Sherene Sires.  Subjective:   PATIENT ID: Elijah Marshall GENDER: male DOB: 29-Jan-1951, MRN: [id number]   HPI  Chief Complaint  Patient presents with   Acute Visit    Pt states states last night caught up some blood ( had drink red juice before ) once more last night then this morning, nothing since 9:30   Elijah Marshall is a 73 year old male with COPD and history of stage Ia non-small cell carcinoma of the right lung s/p resection in 2017 who presents with coughing up blood.  He has been experiencing episodes of hemoptysis since last night. Initially, he thought it was due to drinking red juice, but later realized it was blood as it became redder. No epistaxis, fever, chills, or night sweats. His temperature was normal this morning. He has not been around anyone sick recently, although his wife had bronchitis about four weeks ago. He has a history of hemoptysis three to four years ago, but it was not as persistent as this episode.  He is currently using ipratropium bromide and fluticasone nasal sprays, which he suspects might be contributing to the issue by drying out his nasal passages. He denies having a nasal drip. He has been on amoxicillin 500 mg for about two weeks for a dental abscess but has not been taking it consistently. He is also using a Stiolto inhaler and has albuterol, which he rarely uses. He uses Flonase and ipratropium nasal sprays regularly, but not Astelin.  He has a history of COPD and experiences constant back pain due to two previous back surgeries. No new pain with deep breaths or coughing, although he sometimes experiences pain on the right side when sitting down. He denies any bleeding in his mouth from the dental abscess.  Past Medical History:  Diagnosis Date   Allergy    Arthritis    BPH with obstruction/lower urinary tract symptoms 2016   Nocturia   Cancer (HCC)    unsure at this time   Cancer of lower lobe of  right lung (HCC)    COPD (chronic obstructive pulmonary disease) (HCC)    per patient's health survey - he put a ?   Depression    Dysrhythmia    Erectile dysfunction due to arterial insufficiency    GERD (gastroesophageal reflux disease)    Hyperlipidemia    Hypertension    Non-small cell carcinoma of lung, right (HCC) 06/24/2015   Pneumonia    Shortness of breath    Ulcer      Family History  Problem Relation Age of Onset   Colon cancer Mother    Heart disease Father    Diabetes Sister    Hypertension Sister    Cancer Sister    Hypertension Brother    Heart disease Maternal Grandmother        heart attack     Social History   Socioeconomic History   Marital status: Married    Spouse name: Not on file   Number of children: Not on file   Years of education: Not on file   Highest education level: High school graduate  Occupational History   Occupation: truck driver-disability    Employer: [employer]  Tobacco Use   Smoking status: Former    Current packs/day: 0.00    Average packs/day: 1 pack/day for 35.0 years (35.0 ttl pk-yrs)    Types: Cigarettes    Start date: 09/23/1976    Quit date:  09/24/2011    Years since quitting: 11.6   Smokeless tobacco: Never  Vaping Use   Vaping status: Never Used  Substance and Sexual Activity   Alcohol use: Not Currently    Comment: very seldom   Drug use: No   Sexual activity: Not on file  Other Topics Concern   Not on file  Social History Narrative   Not on file   Social Drivers of Health   Financial Resource Strain: Low Risk  (04/17/2017)   Overall Financial Resource Strain (CARDIA)    Difficulty of Paying Living Expenses: Not hard at all  Food Insecurity: No Food Insecurity (04/17/2017)   Hunger Vital Sign    Worried About Running Out of Food in the Last Year: Never true    Ran Out of Food in the Last Year: Never true  Transportation Needs: No Transportation Needs (04/17/2017)   PRAPARE - Doctor, general practice (Medical): No    Lack of Transportation (Non-Medical): No  Physical Activity: Insufficiently Active (04/17/2017)   Exercise Vital Sign    Days of Exercise per Week: 4 days    Minutes of Exercise per Session: 30 min  Stress: Stress Concern Present (04/17/2017)   [employer] of Occupational Health - Occupational Stress Questionnaire    Feeling of Stress : To some extent  Social Connections: Moderately Integrated (04/17/2017)   Social Connection and Isolation Panel [NHANES]    Frequency of Communication with Friends and Family: More than three times a week    Frequency of Social Gatherings with Friends and Family: More than three times a week    Attends Religious Services: More than 4 times per year    Active Member of [employer] or Organizations: No    Attends Banker Meetings: Never    Marital Status: Married  Catering manager Violence: Not At Risk (04/17/2017)   Humiliation, Afraid, Rape, and Kick questionnaire    Fear of Current or Ex-Partner: No    Emotionally Abused: No    Physically Abused: No    Sexually Abused: No     Allergies  Allergen Reactions   Aspirin Nausea And Vomiting   Morphine Itching   Morphine And Codeine Itching     Outpatient Medications Prior to Visit  Medication Sig Dispense Refill   albuterol (VENTOLIN HFA) 108 (90 Base) MCG/ACT inhaler Inhale 2 puffs into the lungs every 6 (six) hours as needed for wheezing or shortness of breath. 1 each 6   amoxicillin (AMOXIL) 500 MG capsule Take by mouth.     atorvastatin (LIPITOR) 40 MG tablet TAKE 1 TABLET BY MOUTH EVERY DAY (Patient taking differently: Take 40 mg by mouth daily.) 90 tablet 0   baclofen (LIORESAL) 10 MG tablet Take 10 mg by mouth daily as needed for muscle spasms.     Brimonidine Tartrate (LUMIFY) 0.025 % SOLN Apply 1 drop to eye daily.     cyclobenzaprine (FLEXERIL) 10 MG tablet Take 1 tablet (10 mg total) by mouth 2 (two) times daily as needed for muscle spasms. 20  tablet 0   famotidine (PEPCID) 40 MG tablet Take 1 tablet (40 mg total) by mouth 2 (two) times daily. (Patient taking differently: Take 40 mg by mouth daily as needed for heartburn.) 180 tablet 3   fluticasone (FLONASE) 50 MCG/ACT nasal spray SPRAY 1 SPRAY INTO BOTH NOSTRILS DAILY. 16 mL 11   hydrochlorothiazide (HYDRODIURIL) 25 MG tablet Take 25 mg by mouth daily.     HYDROcodone-acetaminophen (NORCO/VICODIN)  5-325 MG tablet Take 1-2 tablets by mouth every 4 (four) hours as needed for moderate pain or severe pain. 40 tablet 0   ipratropium (ATROVENT) 0.03 % nasal spray Place 2 sprays into both nostrils every 12 (twelve) hours. 30 mL 5   lansoprazole (PREVACID) 30 MG capsule TAKE 1 CAPSULE (30 MG TOTAL) BY MOUTH 2 (TWO) TIMES DAILY. (Patient taking differently: Take 30 mg by mouth 2 (two) times daily before a meal.) 180 capsule 1   levocetirizine (XYZAL) 5 MG tablet Take 5 mg by mouth every evening.     metoprolol succinate (TOPROL-XL) 50 MG 24 hr tablet TAKE 1 TABLET BY MOUTH EVERY DAY WITH OR IMMEDIATELY FOLLOWING A MEAL 90 tablet 1   montelukast (SINGULAIR) 10 MG tablet Take 10 mg by mouth daily.     Multiple Vitamin (MULTIVITAMIN ADULT PO) Take by mouth.     OXYGEN Inhale 2 L into the lungs daily as needed (with exertion only).     polyethylene glycol (MIRALAX / GLYCOLAX) 17 g packet Take 17 g by mouth daily.     sildenafil (VIAGRA) 100 MG tablet Take 100 mg by mouth daily as needed for erectile dysfunction.     Tiotropium Bromide-Olodaterol (STIOLTO RESPIMAT) 2.5-2.5 MCG/ACT AERS Inhale 2 puffs into the lungs daily. 1 each 5   Vitamin D, Ergocalciferol, (DRISDOL) 1.25 MG (50000 UNIT) CAPS capsule Take 50,000 Units by mouth once a week.     azelastine (ASTELIN) 0.1 % nasal spray Place 1 spray into both nostrils 2 (two) times daily. Use in each nostril as directed 30 mL 11   No facility-administered medications prior to visit.   Review of Systems  Constitutional:  Negative for chills, fever,  malaise/fatigue and weight loss.  HENT:  Negative for congestion, sinus pain and sore throat.   Eyes: Negative.   Respiratory:  Positive for cough, hemoptysis and sputum production. Negative for shortness of breath and wheezing.   Cardiovascular:  Negative for chest pain, palpitations, orthopnea, claudication and leg swelling.  Gastrointestinal:  Negative for abdominal pain, heartburn, nausea and vomiting.  Genitourinary: Negative.   Musculoskeletal:  Negative for joint pain and myalgias.  Skin:  Negative for rash.  Neurological:  Negative for weakness.  Endo/Heme/Allergies: Negative.   Psychiatric/Behavioral: Negative.     Objective:   Vitals:   05/02/23 1053  BP: 124/78  Pulse: 80  SpO2: 96%  Weight: 171 lb (77.6 kg)  Height: 5\' 9"  (1.753 m)   Physical Exam Constitutional:      General: He is not in acute distress.    Appearance: Normal appearance.  Eyes:     General: No scleral icterus.    Conjunctiva/sclera: Conjunctivae normal.  Cardiovascular:     Rate and Rhythm: Normal rate and regular rhythm.  Pulmonary:     Breath sounds: No wheezing, rhonchi or rales.  Musculoskeletal:     Right lower leg: No edema.     Left lower leg: No edema.  Skin:    General: Skin is warm and dry.  Neurological:     General: No focal deficit present.    CBC    Component Value Date/Time   WBC 5.5 05/02/2023 1131   RBC 5.99 (H) 05/02/2023 1131   HGB 17.6 (H) 05/02/2023 1131   HGB 17.9 (H) 12/19/2021 1135   HGB 18.1 (H) 03/15/2020 1153   HGB 16.2 12/25/2016 1010   HCT 52.7 (H) 05/02/2023 1131   HCT 53.2 (H) 03/15/2020 1153   HCT 48.6 12/25/2016 1010  PLT 211.0 05/02/2023 1131   PLT 232 12/19/2021 1135   PLT 280 03/15/2020 1153   MCV 87.9 05/02/2023 1131   MCV 88 03/15/2020 1153   MCV 88.2 12/25/2016 1010   MCH 30.0 12/19/2021 1135   MCHC 33.4 05/02/2023 1131   RDW 13.4 05/02/2023 1131   RDW 12.7 03/15/2020 1153   RDW 13.6 12/25/2016 1010   LYMPHSABS 1.6 05/02/2023 1131    LYMPHSABS 2.1 12/25/2016 1010   MONOABS 0.6 05/02/2023 1131   MONOABS 0.7 12/25/2016 1010   EOSABS 0.1 05/02/2023 1131   EOSABS 0.1 12/25/2016 1010   BASOSABS 0.0 05/02/2023 1131   BASOSABS 0.0 12/25/2016 1010     Chest imaging: CXR 05/02/23 There is hyperinflation of the lungs compatible with COPD. Scarring in the right lung base. No acute confluent airspace opacities or effusions. Heart and mediastinal contours within normal limits  PFT:    Latest Ref Rng & Units 05/22/2017    2:01 PM 01/10/2016    1:47 PM 10/29/2013   12:05 PM  PFT Results  FVC-Pre L 2.57  2.90  3.43   FVC-Predicted Pre % 74  83  97   FVC-Post L 2.66  2.91  3.39   FVC-Predicted Post % 76  83  96   Pre FEV1/FVC % % 46  47  50   Post FEV1/FCV % % 46  47  51   FEV1-Pre L 1.19  1.36  1.70   FEV1-Predicted Pre % 45  51  62   FEV1-Post L 1.21  1.38  1.71   DLCO uncorrected ml/min/mmHg 10.88  12.41  13.77   DLCO UNC% % 38  43  48   DLCO corrected ml/min/mmHg  11.69    DLCO COR %Predicted %  41    DLVA Predicted % 49  47  53   TLC L 6.83  6.87  5.25   TLC % Predicted % 106  107  82   RV % Predicted % 162  152  62     Labs:  Path:  Echo:  Heart Catheterization:       Assessment & Plan:   Cough with hemoptysis - Plan: amoxicillin-clavulanate (AUGMENTIN) 875-125 MG tablet, CBC with Differential/Platelet, CBC with Differential/Platelet  COPD mixed type (HCC)  Non-small cell carcinoma of lung, right (HCC)  Discussion: Elijah Marshall is a 73 year old male with COPD and history of stage Ia non-small cell carcinoma of the right lung s/p resection in 2017 who presents with coughing up blood.  Hemoptysis Intermittent hemoptysis with potential nasal origin vs true hemoptysis from infection. Chest X-ray showed no acute opacities. Previous CT scan unremarkable in 2024 given history of lung cancer. Plan to monitor and treat if symptoms recur. - Prescribed Augmentin 1 tab twice daily for 7 days. -  Order CBC to assess for infection. - Consider CT scan if hemoptysis recurs. - Advised to call if hemoptysis recurs.  Chronic Obstructive Pulmonary Disease (COPD) COPD with lung hyperinflation. Current inhaler regimen effective. Discussed nasal dryness management. - Continue Stiolto inhaler regimen. - Advise saline nasal spray or gel for nasal dryness.  Follow up as scheduled  Melody Comas, MD [facility] Office: [phone]    Current Outpatient Medications:    albuterol (VENTOLIN HFA) 108 (90 Base) MCG/ACT inhaler, Inhale 2 puffs into the lungs every 6 (six) hours as needed for wheezing or shortness of breath., Disp: 1 each, Rfl: 6   amoxicillin (AMOXIL) 500 MG capsule, Take by  mouth., Disp: , Rfl:    amoxicillin-clavulanate (AUGMENTIN) 875-125 MG tablet, Take 1 tablet by mouth 2 (two) times daily., Disp: 14 tablet, Rfl: 0   atorvastatin (LIPITOR) 40 MG tablet, TAKE 1 TABLET BY MOUTH EVERY DAY (Patient taking differently: Take 40 mg by mouth daily.), Disp: 90 tablet, Rfl: 0   baclofen (LIORESAL) 10 MG tablet, Take 10 mg by mouth daily as needed for muscle spasms., Disp: , Rfl:    Brimonidine Tartrate (LUMIFY) 0.025 % SOLN, Apply 1 drop to eye daily., Disp: , Rfl:    cyclobenzaprine (FLEXERIL) 10 MG tablet, Take 1 tablet (10 mg total) by mouth 2 (two) times daily as needed for muscle spasms., Disp: 20 tablet, Rfl: 0   famotidine (PEPCID) 40 MG tablet, Take 1 tablet (40 mg total) by mouth 2 (two) times daily. (Patient taking differently: Take 40 mg by mouth daily as needed for heartburn.), Disp: 180 tablet, Rfl: 3   fluticasone (FLONASE) 50 MCG/ACT nasal spray, SPRAY 1 SPRAY INTO BOTH NOSTRILS DAILY., Disp: 16 mL, Rfl: 11   hydrochlorothiazide (HYDRODIURIL) 25 MG tablet, Take 25 mg by mouth daily., Disp: , Rfl:    HYDROcodone-acetaminophen (NORCO/VICODIN) 5-325 MG tablet, Take 1-2 tablets by mouth every 4 (four) hours as needed for moderate pain or severe pain.,  Disp: 40 tablet, Rfl: 0   ipratropium (ATROVENT) 0.03 % nasal spray, Place 2 sprays into both nostrils every 12 (twelve) hours., Disp: 30 mL, Rfl: 5   lansoprazole (PREVACID) 30 MG capsule, TAKE 1 CAPSULE (30 MG TOTAL) BY MOUTH 2 (TWO) TIMES DAILY. (Patient taking differently: Take 30 mg by mouth 2 (two) times daily before a meal.), Disp: 180 capsule, Rfl: 1   levocetirizine (XYZAL) 5 MG tablet, Take 5 mg by mouth every evening., Disp: , Rfl:    metoprolol succinate (TOPROL-XL) 50 MG 24 hr tablet, TAKE 1 TABLET BY MOUTH EVERY DAY WITH OR IMMEDIATELY FOLLOWING A MEAL, Disp: 90 tablet, Rfl: 1   montelukast (SINGULAIR) 10 MG tablet, Take 10 mg by mouth daily., Disp: , Rfl:    Multiple Vitamin (MULTIVITAMIN ADULT PO), Take by mouth., Disp: , Rfl:    OXYGEN, Inhale 2 L into the lungs daily as needed (with exertion only)., Disp: , Rfl:    polyethylene glycol (MIRALAX / GLYCOLAX) 17 g packet, Take 17 g by mouth daily., Disp: , Rfl:    sildenafil (VIAGRA) 100 MG tablet, Take 100 mg by mouth daily as needed for erectile dysfunction., Disp: , Rfl:    Tiotropium Bromide-Olodaterol (STIOLTO RESPIMAT) 2.5-2.5 MCG/ACT AERS, Inhale 2 puffs into the lungs daily., Disp: 1 each, Rfl: 5   Vitamin D, Ergocalciferol, (DRISDOL) 1.25 MG (50000 UNIT) CAPS capsule, Take 50,000 Units by mouth once a week., Disp: , Rfl:

## 2023-05-03 ENCOUNTER — Telehealth: Payer: Self-pay

## 2023-05-03 NOTE — Telephone Encounter (Signed)
 spoke w/ PT gave results.

## 2023-07-03 ENCOUNTER — Telehealth: Payer: Self-pay | Admitting: Internal Medicine

## 2023-07-03 NOTE — Telephone Encounter (Signed)
 Rc'd fax from [place] for OV notes and Dr. Al[place]an [place].Will fwd to Dr. Diania Fortes.

## 2023-07-23 ENCOUNTER — Other Ambulatory Visit: Payer: Self-pay | Admitting: Pulmonary Disease

## 2023-07-23 MED ORDER — ALBUTEROL SULFATE HFA 108 (90 BASE) MCG/ACT IN AERS: 2.0000 | INHALATION_SPRAY | Freq: Four times a day (QID) | RESPIRATORY_TRACT | 6 refills | Status: AC | PRN

## 2023-07-23 NOTE — Telephone Encounter (Signed)
 Copied from CRM [id number]. Topic: Clinical - Medication Refill >> Jul 23, 2023  4:18 PM Corean Deutscher wrote: Medication: albuterol  (VENTOLIN  HFA) 108 (90 Base) MCG/ACT inhaler  Has the patient contacted their pharmacy? No (Agent: If no, request that the patient contact the pharmacy for the refill. If patient does not wish to contact the pharmacy document the reason why and proceed with request.) (Agent: If yes, when and what did the pharmacy advise?)  This is the patient's preferred pharmacy:  [id number] - [address] [place] [address] Phone: [phone] Fax: [phone]  Is this the correct pharmacy for this prescription? Yes If no, delete pharmacy and type the correct one.   Has the prescription been filled recently? No  Is the patient out of the medication? No, down to 10 puffs  Has the patient been seen for an appointment in the last year OR does the patient have an upcoming appointment? Yes  Can we respond through MyChart? No  Agent: Please be advised that Rx refills may take up to 3 business days. We ask that you follow-up with your pharmacy.

## 2023-07-24 NOTE — Telephone Encounter (Signed)
 Rc'd signed [facility] fax for POC. Will fax w/PT's OV notes to [phone]. Once confirmed will send to scan.

## 2023-08-08 ENCOUNTER — Telehealth: Payer: Self-pay | Admitting: Internal Medicine

## 2023-08-08 NOTE — Telephone Encounter (Signed)
 Scheduled appointments with the patient per re-referral for established.

## 2023-08-27 ENCOUNTER — Other Ambulatory Visit: Payer: Self-pay | Admitting: Medical Oncology

## 2023-08-27 DIAGNOSIS — C3491 Malignant neoplasm of unspecified part of right bronchus or lung: Secondary | ICD-10-CM

## 2023-08-28 ENCOUNTER — Inpatient Hospital Stay

## 2023-08-28 ENCOUNTER — Inpatient Hospital Stay (HOSPITAL_BASED_OUTPATIENT_CLINIC_OR_DEPARTMENT_OTHER): Admitting: Internal Medicine

## 2023-08-28 ENCOUNTER — Inpatient Hospital Stay: Attending: Internal Medicine

## 2023-08-28 VITALS — BP 134/79 | HR 85 | Temp 98.0°F | Resp 18 | Ht 69.0 in | Wt 166.9 lb

## 2023-08-28 DIAGNOSIS — Z79899 Other long term (current) drug therapy: Secondary | ICD-10-CM | POA: Insufficient documentation

## 2023-08-28 DIAGNOSIS — J449 Chronic obstructive pulmonary disease, unspecified: Secondary | ICD-10-CM | POA: Diagnosis not present

## 2023-08-28 DIAGNOSIS — N401 Enlarged prostate with lower urinary tract symptoms: Secondary | ICD-10-CM | POA: Insufficient documentation

## 2023-08-28 DIAGNOSIS — R718 Other abnormality of red blood cells: Secondary | ICD-10-CM | POA: Diagnosis present

## 2023-08-28 DIAGNOSIS — D45 Polycythemia vera: Secondary | ICD-10-CM | POA: Diagnosis not present

## 2023-08-28 DIAGNOSIS — I10 Essential (primary) hypertension: Secondary | ICD-10-CM | POA: Diagnosis not present

## 2023-08-28 DIAGNOSIS — C3491 Malignant neoplasm of unspecified part of right bronchus or lung: Secondary | ICD-10-CM

## 2023-08-28 DIAGNOSIS — E785 Hyperlipidemia, unspecified: Secondary | ICD-10-CM | POA: Diagnosis not present

## 2023-08-28 DIAGNOSIS — Z85118 Personal history of other malignant neoplasm of bronchus and lung: Secondary | ICD-10-CM | POA: Insufficient documentation

## 2023-08-28 LAB — CMP (CANCER CENTER ONLY)
ALT: 24 U/L (ref 0–44)
AST: 27 U/L (ref 15–41)
Albumin: 4.5 g/dL (ref 3.5–5.0)
Alkaline Phosphatase: 106 U/L (ref 38–126)
Anion gap: 9 (ref 5–15)
BUN: 12 mg/dL (ref 8–23)
CO2: 33 mmol/L — ABNORMAL HIGH (ref 22–32)
Calcium: 10.1 mg/dL (ref 8.9–10.3)
Chloride: 101 mmol/L (ref 98–111)
Creatinine: 1.29 mg/dL — ABNORMAL HIGH (ref 0.61–1.24)
GFR, Estimated: 59 mL/min — ABNORMAL LOW (ref >60)
Glucose, Bld: 90 mg/dL (ref 70–99)
Potassium: 4.1 mmol/L (ref 3.5–5.1)
Sodium: 143 mmol/L (ref 135–145)
Total Bilirubin: 0.8 mg/dL (ref 0.0–1.2)
Total Protein: 7.5 g/dL (ref 6.5–8.1)

## 2023-08-28 LAB — IRON AND IRON BINDING CAPACITY (CC-WL,HP ONLY)
Iron: 98 ug/dL (ref 45–182)
Saturation Ratios: 30 % (ref 17.9–39.5)
TIBC: 325 ug/dL (ref 250–450)
UIBC: 227 ug/dL (ref 117–376)

## 2023-08-28 LAB — CBC WITH DIFFERENTIAL (CANCER CENTER ONLY)
Abs Immature Granulocytes: 0.02 K/uL (ref 0.00–0.07)
Basophils Absolute: 0 K/uL (ref 0.0–0.1)
Basophils Relative: 1 %
Eosinophils Absolute: 0.2 K/uL (ref 0.0–0.5)
Eosinophils Relative: 2 %
HCT: 52.3 % — ABNORMAL HIGH (ref 39.0–52.0)
Hemoglobin: 17.7 g/dL — ABNORMAL HIGH (ref 13.0–17.0)
Immature Granulocytes: 0 %
Lymphocytes Relative: 30 %
Lymphs Abs: 1.9 K/uL (ref 0.7–4.0)
MCH: 29.3 pg (ref 26.0–34.0)
MCHC: 33.8 g/dL (ref 30.0–36.0)
MCV: 86.6 fL (ref 80.0–100.0)
Monocytes Absolute: 0.6 K/uL (ref 0.1–1.0)
Monocytes Relative: 10 %
Neutro Abs: 3.6 K/uL (ref 1.7–7.7)
Neutrophils Relative %: 57 %
Platelet Count: 216 K/uL (ref 150–400)
RBC: 6.04 MIL/uL — ABNORMAL HIGH (ref 4.22–5.81)
RDW: 12.8 % (ref 11.5–15.5)
WBC Count: 6.4 K/uL (ref 4.0–10.5)
nRBC: 0 % (ref 0.0–0.2)

## 2023-08-28 LAB — FERRITIN: Ferritin: 111 ng/mL (ref 24–336)

## 2023-08-28 LAB — LACTATE DEHYDROGENASE: LDH: 197 U/L — ABNORMAL HIGH (ref 98–192)

## 2023-08-28 NOTE — Progress Notes (Signed)
 [facility] [phone]   [phone]  OFFICE PROGRESS NOTE  Salvador Buckley POUR, MD [address] [place] [address]  DIAGNOSIS:  1) polycythemia likely reactive. 2) Stage IA (T1a, N0, M0) non-small cell lung cancer, moderately differentiated squamous cell carcinoma presented with right lower lobe lung nodule.   PRIOR THERAPY: Status post wedge resection of the right lower lobe in April 2017 under the care of Dr. Kerrin.  CURRENT THERAPY: Observation.  INTERVAL HISTORY: Elijah Marshall 73 y.o. male returns to the clinic today for follow-up visit.Discussed the use of AI scribe software for clinical note transcription with the patient, who gave verbal consent to proceed.  History of Present Illness Elijah Marshall is a 73 year old male who presents with elevated hemoglobin levels. He was referred by his primary care doctor for evaluation of elevated hemoglobin levels.  He has a history of stage 1A non-small cell lung cancer, specifically squamous cell carcinoma, for which he underwent a right lower lobe wedge resection in April 2017. He has been on observation since then and has been undergoing annual CT scans, with the last one in December 2024.  His primary care doctor noted elevated hemoglobin levels, described as his blood being 'too thick', with levels over 17. No new symptoms such as chest pain, shortness of breath, cough, nausea, vomiting, diarrhea, or headaches. He experiences 'regular shortness of breath' but no other respiratory symptoms.  He denies being a smoker, which was discussed in the context of potential causes for elevated hemoglobin levels.     MEDICAL HISTORY: Past Medical History:  Diagnosis Date   Allergy     Arthritis    BPH with obstruction/lower urinary tract symptoms 2016   Nocturia   Cancer (HCC)    unsure at this time   Cancer of lower lobe of right lung (HCC)    COPD (chronic obstructive  pulmonary disease) (HCC)    per patient's health survey - he put a ?   Depression    Dysrhythmia    Erectile dysfunction due to arterial insufficiency    GERD (gastroesophageal reflux disease)    Hyperlipidemia    Hypertension    Non-small cell carcinoma of lung, right (HCC) 06/24/2015   Pneumonia    Shortness of breath    Ulcer     ALLERGIES:  is allergic to aspirin, morphine, and morphine and codeine.  MEDICATIONS:  Current Outpatient Medications  Medication Sig Dispense Refill   albuterol  (VENTOLIN  HFA) 108 (90 Base) MCG/ACT inhaler Inhale 2 puffs into the lungs every 6 (six) hours as needed for wheezing or shortness of breath. 1 each 6   amoxicillin  (AMOXIL ) 500 MG capsule Take by mouth.     amoxicillin -clavulanate (AUGMENTIN ) 875-125 MG tablet Take 1 tablet by mouth 2 (two) times daily. 14 tablet 0   atorvastatin  (LIPITOR) 40 MG tablet TAKE 1 TABLET BY MOUTH EVERY DAY (Patient taking differently: Take 40 mg by mouth daily.) 90 tablet 0   baclofen  (LIORESAL ) 10 MG tablet Take 10 mg by mouth daily as needed for muscle spasms.     Brimonidine Tartrate (LUMIFY) 0.025 % SOLN Apply 1 drop to eye daily.     cyclobenzaprine  (FLEXERIL ) 10 MG tablet Take 1 tablet (10 mg total) by mouth 2 (two) times daily as needed for muscle spasms. 20 tablet 0   famotidine  (PEPCID ) 40 MG tablet Take 1 tablet (40 mg total) by mouth 2 (two) times daily. (Patient taking differently: Take  40 mg by mouth daily as needed for heartburn.) 180 tablet 3   fluticasone  (FLONASE ) 50 MCG/ACT nasal spray SPRAY 1 SPRAY INTO BOTH NOSTRILS DAILY. 16 mL 11   hydrochlorothiazide  (HYDRODIURIL ) 25 MG tablet Take 25 mg by mouth daily.     HYDROcodone -acetaminophen  (NORCO/VICODIN) 5-325 MG tablet Take 1-2 tablets by mouth every 4 (four) hours as needed for moderate pain or severe pain. 40 tablet 0   ipratropium (ATROVENT ) 0.03 % nasal spray Place 2 sprays into both nostrils every 12 (twelve) hours. 30 mL 5   lansoprazole   (PREVACID ) 30 MG capsule TAKE 1 CAPSULE (30 MG TOTAL) BY MOUTH 2 (TWO) TIMES DAILY. (Patient taking differently: Take 30 mg by mouth 2 (two) times daily before a meal.) 180 capsule 1   levocetirizine (XYZAL ) 5 MG tablet Take 5 mg by mouth every evening.     metoprolol  succinate (TOPROL -XL) 50 MG 24 hr tablet TAKE 1 TABLET BY MOUTH EVERY DAY WITH OR IMMEDIATELY FOLLOWING A MEAL 90 tablet 1   montelukast  (SINGULAIR ) 10 MG tablet Take 10 mg by mouth daily.     Multiple Vitamin (MULTIVITAMIN ADULT PO) Take by mouth.     OXYGEN  Inhale 2 L into the lungs daily as needed (with exertion only).     polyethylene glycol (MIRALAX  / GLYCOLAX ) 17 g packet Take 17 g by mouth daily.     sildenafil (VIAGRA) 100 MG tablet Take 100 mg by mouth daily as needed for erectile dysfunction.     Tiotropium Bromide -Olodaterol (STIOLTO RESPIMAT ) 2.5-2.5 MCG/ACT AERS Inhale 2 puffs into the lungs daily. 1 each 5   Vitamin D, Ergocalciferol, (DRISDOL) 1.25 MG (50000 UNIT) CAPS capsule Take 50,000 Units by mouth once a week.     No current facility-administered medications for this visit.    SURGICAL HISTORY:  Past Surgical History:  Procedure Laterality Date   ANTERIOR FUSION LUMBAR SPINE  07/25/2012   ANTERIOR LAT LUMBAR FUSION Left 07/25/2012   Procedure: ANTERIOR LATERAL LUMBAR FUSION 1 LEVEL;  Surgeon: Oneil Rodgers Priestly, MD;  Location: [facility] OR;  Service: Orthopedics;  Laterality: Left;  Left sided lumbar 3-4 lateral interbody fusion with allograft and instrumentation   BACK SURGERY     BIOPSY  04/16/2020   Procedure: BIOPSY;  Surgeon: Rollin Dover, MD;  Location: [facility];  Service: Endoscopy;;   COLONOSCOPY  02/07/2012   polyps rem   ESOPHAGOGASTRODUODENOSCOPY N/A 07/27/2020   Procedure: ESOPHAGOGASTRODUODENOSCOPY (EGD);  Surgeon: Rollin Dover, MD;  Location: [facility] OR;  Service: Endoscopy;  Laterality: N/A;   ESOPHAGOGASTRODUODENOSCOPY (EGD) WITH PROPOFOL  N/A 04/16/2020   Procedure: ESOPHAGOGASTRODUODENOSCOPY  (EGD) WITH PROPOFOL ;  Surgeon: Rollin Dover, MD;  Location: [facility];  Service: Endoscopy;  Laterality: N/A;   JOINT REPLACEMENT Right 02/07/2008   right knee   NODE DISSECTION Right 05/24/2015   Procedure: NODE DISSECTION;  Surgeon: Elspeth JAYSON Millers, MD;  Location: [facility] OR;  Service: Thoracic;  Laterality: Right;   SEGMENTECOMY Right 05/24/2015   Procedure: RIGHT LOWER LOBE SUPERIOR SEGMENTECTOMY;  Surgeon: Elspeth JAYSON Millers, MD;  Location: [facility] OR;  Service: Thoracic;  Laterality: Right;   SPINE SURGERY  02/06/2010   SUBMUCOSAL TATTOO INJECTION  04/16/2020   Procedure: SUBMUCOSAL TATTOO INJECTION;  Surgeon: Rollin Dover, MD;  Location: [facility];  Service: Endoscopy;;   UPPER ESOPHAGEAL ENDOSCOPIC ULTRASOUND (EUS) N/A 04/16/2020   Procedure: UPPER ESOPHAGEAL ENDOSCOPIC ULTRASOUND (EUS);  Surgeon: Rollin Dover, MD;  Location: [facility];  Service: Endoscopy;  Laterality: N/A;   VIDEO ASSISTED THORACOSCOPY (VATS)/WEDGE RESECTION Right  05/24/2015   Procedure: RIGHT VIDEO ASSISTED THORACOSCOPY (VATS)/ RIGHTR LOWER LOBE WEDGE RESECTION;  Surgeon: Elspeth JAYSON Millers, MD;  Location: [facility] OR;  Service: Thoracic;  Laterality: Right;    REVIEW OF SYSTEMS:  Constitutional: negative Eyes: negative Ears, nose, mouth, throat, and face: negative Respiratory: positive for dyspnea on exertion Cardiovascular: negative Gastrointestinal: negative Genitourinary:negative Integument/breast: negative Hematologic/lymphatic: negative Musculoskeletal:negative Neurological: negative Behavioral/Psych: negative Endocrine: negative Allergic/Immunologic: negative   PHYSICAL EXAMINATION: General appearance: alert, cooperative, and no distress Head: Normocephalic, without obvious abnormality, atraumatic Neck: no adenopathy, no JVD, supple, symmetrical, trachea midline, and thyroid  not enlarged, symmetric, no tenderness/mass/nodules Lymph nodes: Cervical, supraclavicular, and axillary nodes  normal. Resp: clear to auscultation bilaterally Back: symmetric, no curvature. ROM normal. No CVA tenderness. Cardio: regular rate and rhythm, S1, S2 normal, no murmur, click, rub or gallop GI: soft, non-tender; bowel sounds normal; no masses,  no organomegaly Extremities: extremities normal, atraumatic, no cyanosis or edema Neurologic: Alert and oriented X 3, normal strength and tone. Normal symmetric reflexes. Normal coordination and gait  ECOG PERFORMANCE STATUS: 1 - Symptomatic but completely ambulatory  Blood pressure 134/79, pulse 85, temperature 98 F (36.7 C), temperature source Oral, resp. rate 18, height 5' 9 (1.753 m), weight 166 lb 14.4 oz (75.7 kg), SpO2 95%.  LABORATORY DATA: Lab Results  Component Value Date   WBC 6.4 08/28/2023   HGB 17.7 (H) 08/28/2023   HCT 52.3 (H) 08/28/2023   MCV 86.6 08/28/2023   PLT 216 08/28/2023      Chemistry      Component Value Date/Time   NA 143 08/28/2023 1021   NA 141 12/25/2016 1010   K 4.1 08/28/2023 1021   K 3.6 12/25/2016 1010   CL 101 08/28/2023 1021   CO2 33 (H) 08/28/2023 1021   CO2 28 12/25/2016 1010   BUN 12 08/28/2023 1021   BUN 6.1 (L) 12/25/2016 1010   CREATININE 1.29 (H) 08/28/2023 1021   CREATININE 1.1 12/25/2016 1010      Component Value Date/Time   CALCIUM  10.1 08/28/2023 1021   CALCIUM  9.6 12/25/2016 1010   ALKPHOS 106 08/28/2023 1021   ALKPHOS 107 12/25/2016 1010   AST 27 08/28/2023 1021   AST 19 12/25/2016 1010   ALT 24 08/28/2023 1021   ALT 15 12/25/2016 1010   BILITOT 0.8 08/28/2023 1021   BILITOT 0.56 12/25/2016 1010       RADIOGRAPHIC STUDIES: No results found.   ASSESSMENT AND PLAN:  This is a very pleasant 73 years old African-American male with a stage IA non-small cell lung cancer status post wedge resection of the right lower lobe in 2017. The patient is currently on observation and he has been doing fine with no concerning complaints. Assessment and Plan Assessment &  Plan Elevated hemoglobin Elevated hemoglobin with levels over 17. Differential diagnosis includes secondary causes such as COPD or primary causes such as polycythemia vera. He is asymptomatic with no chest pain, shortness of breath, or other systemic symptoms. Further evaluation is necessary to determine the underlying cause. - Order JAK2 mutation panel - Order iron studies - Order erythropoietin  level - Review lab results in one month to determine further management  Stage 1A non-small cell lung cancer Stage 1A non-small cell lung cancer, squamous cell carcinoma, status post right lower lobe wedge resection in April 2017. He has been on observation since that time with no evidence of recurrence. Last CT scan in December 2024 was okay. - Continue annual CT lung screening with pulmonary  medicine The patient was advised to call immediately if he has any concerning symptoms in the interval.   All questions were answered. The patient knows to call the clinic with any problems, questions or concerns. We can certainly see the patient much sooner if necessary.  The total time spent in the appointment was 30 minutes including review of chart and various tests results, discussions about plan of care and coordination of care plan .  Disclaimer: This note was dictated with voice recognition software. Similar sounding words can inadvertently be transcribed and may not be corrected upon review.

## 2023-08-29 LAB — ERYTHROPOIETIN: Erythropoietin: 13.8 m[IU]/mL (ref 2.6–18.5)

## 2023-08-30 ENCOUNTER — Telehealth: Payer: Self-pay | Admitting: Internal Medicine

## 2023-08-30 NOTE — Telephone Encounter (Signed)
Scheduled appointments with the patient

## 2023-09-03 LAB — NGS JAK2 E12-15/CALR/MPL

## 2023-09-12 ENCOUNTER — Other Ambulatory Visit: Payer: Self-pay | Admitting: Adult Health

## 2023-09-25 ENCOUNTER — Other Ambulatory Visit: Payer: Self-pay | Admitting: Cardiology

## 2023-09-25 DIAGNOSIS — I484 Atypical atrial flutter: Secondary | ICD-10-CM

## 2023-10-03 ENCOUNTER — Inpatient Hospital Stay (HOSPITAL_BASED_OUTPATIENT_CLINIC_OR_DEPARTMENT_OTHER): Admitting: Internal Medicine

## 2023-10-03 ENCOUNTER — Inpatient Hospital Stay: Attending: Internal Medicine

## 2023-10-03 VITALS — BP 133/86 | Temp 98.1°F | Resp 17 | Wt 168.1 lb

## 2023-10-03 DIAGNOSIS — C3491 Malignant neoplasm of unspecified part of right bronchus or lung: Secondary | ICD-10-CM | POA: Diagnosis not present

## 2023-10-03 DIAGNOSIS — D751 Secondary polycythemia: Secondary | ICD-10-CM | POA: Insufficient documentation

## 2023-10-03 DIAGNOSIS — D45 Polycythemia vera: Secondary | ICD-10-CM

## 2023-10-03 DIAGNOSIS — Z85118 Personal history of other malignant neoplasm of bronchus and lung: Secondary | ICD-10-CM | POA: Insufficient documentation

## 2023-10-03 DIAGNOSIS — Z79899 Other long term (current) drug therapy: Secondary | ICD-10-CM | POA: Diagnosis not present

## 2023-10-03 DIAGNOSIS — Z7982 Long term (current) use of aspirin: Secondary | ICD-10-CM | POA: Insufficient documentation

## 2023-10-03 DIAGNOSIS — Z902 Acquired absence of lung [part of]: Secondary | ICD-10-CM | POA: Diagnosis not present

## 2023-10-03 LAB — CBC WITH DIFFERENTIAL (CANCER CENTER ONLY)
Abs Immature Granulocytes: 0.02 K/uL (ref 0.00–0.07)
Basophils Absolute: 0 K/uL (ref 0.0–0.1)
Basophils Relative: 1 %
Eosinophils Absolute: 0.2 K/uL (ref 0.0–0.5)
Eosinophils Relative: 3 %
HCT: 51.4 % (ref 39.0–52.0)
Hemoglobin: 17.2 g/dL — ABNORMAL HIGH (ref 13.0–17.0)
Immature Granulocytes: 0 %
Lymphocytes Relative: 28 %
Lymphs Abs: 1.8 K/uL (ref 0.7–4.0)
MCH: 29 pg (ref 26.0–34.0)
MCHC: 33.5 g/dL (ref 30.0–36.0)
MCV: 86.5 fL (ref 80.0–100.0)
Monocytes Absolute: 0.9 K/uL (ref 0.1–1.0)
Monocytes Relative: 14 %
Neutro Abs: 3.5 K/uL (ref 1.7–7.7)
Neutrophils Relative %: 54 %
Platelet Count: 210 K/uL (ref 150–400)
RBC: 5.94 MIL/uL — ABNORMAL HIGH (ref 4.22–5.81)
RDW: 12.7 % (ref 11.5–15.5)
WBC Count: 6.4 K/uL (ref 4.0–10.5)
nRBC: 0 % (ref 0.0–0.2)

## 2023-10-03 NOTE — Progress Notes (Signed)
 [facility] [phone]   Fax:([phone]  OFFICE PROGRESS NOTE  Elijah Buckley POUR, MD [address] [place] [address]  DIAGNOSIS:  1) polycythemia likely reactive with negative JAK2 mutation . 2) Stage IA (T1a, N0, M0) non-small cell lung cancer, moderately differentiated squamous cell carcinoma presented with right lower lobe lung nodule.   PRIOR THERAPY: Status post wedge resection of the right lower lobe in April 2017 under the care of Dr. Kerrin.  CURRENT THERAPY: Observation.  INTERVAL HISTORY: Elijah Marshall 73 y.o. male returns to the clinic today for follow-up visit.Discussed the use of AI scribe software for clinical note transcription with the patient, who gave verbal consent to proceed.  History of Present Illness Elijah Marshall is a 73 year old male with reactive polycythemia who presents for evaluation and repeat blood work.  He has a history of stage IA non-small cell lung cancer, specifically squamous cell carcinoma, for which he underwent a wedge resection of the right lower lobe in April 2017. He has been on observation since that time.  He continues to experience difficulty with phlegm production. No sleep apnea.  His blood work has shown elevated hemoglobin and hematocrit levels. A JAK2 mutation panel was negative.  His current medications include a baby aspirin. His hemoglobin is currently 17.2 and hematocrit is 51.4.     MEDICAL HISTORY: Past Medical History:  Diagnosis Date   Allergy     Arthritis    BPH with obstruction/lower urinary tract symptoms 2016   Nocturia   Cancer (HCC)    unsure at this time   Cancer of lower lobe of right lung (HCC)    COPD (chronic obstructive pulmonary disease) (HCC)    per patient's health survey - he put a ?   Depression    Dysrhythmia    Erectile dysfunction due to arterial insufficiency    GERD (gastroesophageal reflux disease)    Hyperlipidemia     Hypertension    Non-small cell carcinoma of lung, right (HCC) 06/24/2015   Pneumonia    Shortness of breath    Ulcer     ALLERGIES:  is allergic to aspirin, morphine, and morphine and codeine.  MEDICATIONS:  Current Outpatient Medications  Medication Sig Dispense Refill   albuterol  (VENTOLIN  HFA) 108 (90 Base) MCG/ACT inhaler Inhale 2 puffs into the lungs every 6 (six) hours as needed for wheezing or shortness of breath. 1 each 6   amoxicillin  (AMOXIL ) 500 MG capsule Take by mouth.     amoxicillin -clavulanate (AUGMENTIN ) 875-125 MG tablet Take 1 tablet by mouth 2 (two) times daily. 14 tablet 0   atorvastatin  (LIPITOR) 40 MG tablet TAKE 1 TABLET BY MOUTH EVERY DAY (Patient taking differently: Take 40 mg by mouth daily.) 90 tablet 0   baclofen  (LIORESAL ) 10 MG tablet Take 10 mg by mouth daily as needed for muscle spasms.     Brimonidine Tartrate (LUMIFY) 0.025 % SOLN Apply 1 drop to eye daily.     cyclobenzaprine  (FLEXERIL ) 10 MG tablet Take 1 tablet (10 mg total) by mouth 2 (two) times daily as needed for muscle spasms. 20 tablet 0   famotidine  (PEPCID ) 40 MG tablet Take 1 tablet (40 mg total) by mouth 2 (two) times daily. (Patient taking differently: Take 40 mg by mouth daily as needed for heartburn.) 180 tablet 3   fluticasone  (FLONASE ) 50 MCG/ACT nasal spray SPRAY 1 SPRAY INTO BOTH NOSTRILS DAILY. 16 mL 11   hydrochlorothiazide  (HYDRODIURIL ) 25  MG tablet Take 25 mg by mouth daily.     HYDROcodone -acetaminophen  (NORCO/VICODIN) 5-325 MG tablet Take 1-2 tablets by mouth every 4 (four) hours as needed for moderate pain or severe pain. 40 tablet 0   ipratropium (ATROVENT ) 0.03 % nasal spray Place 2 sprays into both nostrils every 12 (twelve) hours. 30 mL 5   lansoprazole  (PREVACID ) 30 MG capsule TAKE 1 CAPSULE (30 MG TOTAL) BY MOUTH 2 (TWO) TIMES DAILY. (Patient taking differently: Take 30 mg by mouth 2 (two) times daily before a meal.) 180 capsule 1   levocetirizine (XYZAL ) 5 MG tablet Take 5  mg by mouth every evening.     metoprolol  succinate (TOPROL -XL) 50 MG 24 hr tablet TAKE 1 TABLET BY MOUTH EVERY DAY WITH OR IMMEDIATELY FOLLOWING A MEAL 30 tablet 0   montelukast  (SINGULAIR ) 10 MG tablet Take 10 mg by mouth daily.     Multiple Vitamin (MULTIVITAMIN ADULT PO) Take by mouth.     OXYGEN  Inhale 2 L into the lungs daily as needed (with exertion only).     polyethylene glycol (MIRALAX  / GLYCOLAX ) 17 g packet Take 17 g by mouth daily.     sildenafil (VIAGRA) 100 MG tablet Take 100 mg by mouth daily as needed for erectile dysfunction.     Tiotropium Bromide -Olodaterol (STIOLTO RESPIMAT ) 2.5-2.5 MCG/ACT AERS INHALE 2 PUFFS BY MOUTH INTO THE LUNGS DAILY 4 g 5   Vitamin D, Ergocalciferol, (DRISDOL) 1.25 MG (50000 UNIT) CAPS capsule Take 50,000 Units by mouth once a week.     No current facility-administered medications for this visit.    SURGICAL HISTORY:  Past Surgical History:  Procedure Laterality Date   ANTERIOR FUSION LUMBAR SPINE  07/25/2012   ANTERIOR LAT LUMBAR FUSION Left 07/25/2012   Procedure: ANTERIOR LATERAL LUMBAR FUSION 1 LEVEL;  Surgeon: Oneil Rodgers Priestly, MD;  Location: [facility] OR;  Service: Orthopedics;  Laterality: Left;  Left sided lumbar 3-4 lateral interbody fusion with allograft and instrumentation   BACK SURGERY     BIOPSY  04/16/2020   Procedure: BIOPSY;  Surgeon: Rollin Dover, MD;  Location: [facility];  Service: Endoscopy;;   COLONOSCOPY  02/07/2012   polyps rem   ESOPHAGOGASTRODUODENOSCOPY N/A 07/27/2020   Procedure: ESOPHAGOGASTRODUODENOSCOPY (EGD);  Surgeon: Rollin Dover, MD;  Location: [facility] OR;  Service: Endoscopy;  Laterality: N/A;   ESOPHAGOGASTRODUODENOSCOPY (EGD) WITH PROPOFOL  N/A 04/16/2020   Procedure: ESOPHAGOGASTRODUODENOSCOPY (EGD) WITH PROPOFOL ;  Surgeon: Rollin Dover, MD;  Location: [facility];  Service: Endoscopy;  Laterality: N/A;   JOINT REPLACEMENT Right 02/07/2008   right knee   NODE DISSECTION Right 05/24/2015   Procedure: NODE  DISSECTION;  Surgeon: Elspeth JAYSON Millers, MD;  Location: [facility] OR;  Service: Thoracic;  Laterality: Right;   SEGMENTECOMY Right 05/24/2015   Procedure: RIGHT LOWER LOBE SUPERIOR SEGMENTECTOMY;  Surgeon: Elspeth JAYSON Millers, MD;  Location: [facility] OR;  Service: Thoracic;  Laterality: Right;   SPINE SURGERY  02/06/2010   SUBMUCOSAL TATTOO INJECTION  04/16/2020   Procedure: SUBMUCOSAL TATTOO INJECTION;  Surgeon: Rollin Dover, MD;  Location: [facility];  Service: Endoscopy;;   UPPER ESOPHAGEAL ENDOSCOPIC ULTRASOUND (EUS) N/A 04/16/2020   Procedure: UPPER ESOPHAGEAL ENDOSCOPIC ULTRASOUND (EUS);  Surgeon: Rollin Dover, MD;  Location: [facility];  Service: Endoscopy;  Laterality: N/A;   VIDEO ASSISTED THORACOSCOPY (VATS)/WEDGE RESECTION Right 05/24/2015   Procedure: RIGHT VIDEO ASSISTED THORACOSCOPY (VATS)/ RIGHTR LOWER LOBE WEDGE RESECTION;  Surgeon: Elspeth JAYSON Millers, MD;  Location: [facility] OR;  Service: Thoracic;  Laterality: Right;  REVIEW OF SYSTEMS:  A comprehensive review of systems was negative except for: Respiratory: positive for cough and dyspnea on exertion   PHYSICAL EXAMINATION: General appearance: alert, cooperative, and no distress Head: Normocephalic, without obvious abnormality, atraumatic Neck: no adenopathy, no JVD, supple, symmetrical, trachea midline, and thyroid  not enlarged, symmetric, no tenderness/mass/nodules Lymph nodes: Cervical, supraclavicular, and axillary nodes normal. Resp: clear to auscultation bilaterally Back: symmetric, no curvature. ROM normal. No CVA tenderness. Cardio: regular rate and rhythm, S1, S2 normal, no murmur, click, rub or gallop GI: soft, non-tender; bowel sounds normal; no masses,  no organomegaly Extremities: extremities normal, atraumatic, no cyanosis or edema  ECOG PERFORMANCE STATUS: 1 - Symptomatic but completely ambulatory  Blood pressure 133/86, temperature 98.1 F (36.7 C), resp. rate 17, weight 168 lb 1.6 oz (76.2 kg).  LABORATORY  DATA: Lab Results  Component Value Date   WBC 6.4 10/03/2023   HGB 17.2 (H) 10/03/2023   HCT 51.4 10/03/2023   MCV 86.5 10/03/2023   PLT 210 10/03/2023      Chemistry      Component Value Date/Time   NA 143 08/28/2023 1021   NA 141 12/25/2016 1010   K 4.1 08/28/2023 1021   K 3.6 12/25/2016 1010   CL 101 08/28/2023 1021   CO2 33 (H) 08/28/2023 1021   CO2 28 12/25/2016 1010   BUN 12 08/28/2023 1021   BUN 6.1 (L) 12/25/2016 1010   CREATININE 1.29 (H) 08/28/2023 1021   CREATININE 1.1 12/25/2016 1010      Component Value Date/Time   CALCIUM  10.1 08/28/2023 1021   CALCIUM  9.6 12/25/2016 1010   ALKPHOS 106 08/28/2023 1021   ALKPHOS 107 12/25/2016 1010   AST 27 08/28/2023 1021   AST 19 12/25/2016 1010   ALT 24 08/28/2023 1021   ALT 15 12/25/2016 1010   BILITOT 0.8 08/28/2023 1021   BILITOT 0.56 12/25/2016 1010       RADIOGRAPHIC STUDIES: No results found.   ASSESSMENT AND PLAN:  This is a very pleasant 73 years old African-American male with a stage IA non-small cell lung cancer status post wedge resection of the right lower lobe in 2017. He was also found to have polycythemia recently.  He underwent extensive investigation including JAK2 mutation panel in addition to iron study, ferritin, erythropoietin  and these were unremarkable.  His polycythemia is likely reactive in nature. The patient is currently on observation and he has been doing fine with no concerning complaints. Assessment and Plan Assessment & Plan Reactive polycythemia secondary to chronic hypoxia Reactive polycythemia likely secondary to chronic hypoxia from COPD and history of lung surgery. JAK2 mutation panel negative, ruling out polycythemia vera. Hemoglobin is 17.2 and hematocrit is 51.4, which are elevated but not concerning. Condition is not serious and does not require intervention beyond monitoring. - Advise taking a baby aspirin to prevent thrombosis. - Suggest donating a unit of blood at the [facility] if he wishes to lower hemoglobin levels.  Chronic obstructive pulmonary disease (COPD) COPD contributing to reactive polycythemia due to chronic hypoxia. No acute exacerbation. Persistent phlegm production reported.  History of stage IA non-small cell lung cancer, status post right lower lobe resection Stage IA non-small cell lung cancer, status post right lower lobe resection in April 2017. No evidence of recurrence. Surveillance beyond five years post-resection, with no current concerns. The patient was advised to call immediately if he has any concerning symptoms.  All questions were answered. The patient knows to call the clinic with  any problems, questions or concerns. We can certainly see the patient much sooner if necessary.  The total time spent in the appointment was 20 minutes including review of chart and various tests results, discussions about plan of care and coordination of care plan .  Disclaimer: This note was dictated with voice recognition software. Similar sounding words can inadvertently be transcribed and may not be corrected upon review.

## 2023-10-10 ENCOUNTER — Ambulatory Visit: Payer: Self-pay | Admitting: Cardiology

## 2023-10-18 ENCOUNTER — Other Ambulatory Visit: Payer: Self-pay | Admitting: Cardiology

## 2023-10-18 DIAGNOSIS — I484 Atypical atrial flutter: Secondary | ICD-10-CM

## 2023-11-01 ENCOUNTER — Other Ambulatory Visit: Payer: Self-pay | Admitting: Cardiology

## 2023-11-01 DIAGNOSIS — I484 Atypical atrial flutter: Secondary | ICD-10-CM

## 2023-11-21 ENCOUNTER — Other Ambulatory Visit: Payer: Self-pay | Admitting: Cardiology

## 2023-11-21 DIAGNOSIS — I484 Atypical atrial flutter: Secondary | ICD-10-CM

## 2023-11-27 ENCOUNTER — Other Ambulatory Visit: Payer: Self-pay | Admitting: Cardiology

## 2023-11-27 DIAGNOSIS — I484 Atypical atrial flutter: Secondary | ICD-10-CM

## 2024-01-07 ENCOUNTER — Encounter: Payer: Self-pay | Admitting: Pulmonary Disease

## 2024-01-07 ENCOUNTER — Ambulatory Visit: Admitting: Pulmonary Disease

## 2024-01-07 ENCOUNTER — Ambulatory Visit: Payer: Self-pay | Admitting: Internal Medicine

## 2024-01-07 VITALS — BP 132/83 | HR 91 | Ht 69.0 in | Wt 163.0 lb

## 2024-01-07 DIAGNOSIS — J449 Chronic obstructive pulmonary disease, unspecified: Secondary | ICD-10-CM

## 2024-01-07 MED ORDER — PREDNISONE 10 MG PO TABS: 40.0000 mg | ORAL_TABLET | Freq: Every day | ORAL | 0 refills | Status: DC

## 2024-01-07 MED ORDER — AZITHROMYCIN 250 MG PO TABS: ORAL_TABLET | ORAL | 0 refills | Status: AC

## 2024-01-07 MED ORDER — STIOLTO RESPIMAT 2.5-2.5 MCG/ACT IN AERS: 2.0000 | INHALATION_SPRAY | Freq: Every day | RESPIRATORY_TRACT | 5 refills | Status: AC

## 2024-01-07 NOTE — Telephone Encounter (Signed)
 FYI Only or Action Required?: FYI only for provider: appointment scheduled on 01/07/24.  Patient is followed in Pulmonology for COPD, lung ca, last seen on 05/02/2023 by Kara Dorn NOVAK, MD.  Called Nurse Triage reporting Cough.  Symptoms began several days ago.  Interventions attempted: OTC medications: mucinex , rescue inhaler.  Symptoms are: gradually worsening.  Triage Disposition: See Physician Within 24 Hours  Patient/caregiver understands and will follow disposition?: Yes  E2C2 Pulmonary Triage - Initial Assessment Questions "Chief Complaint (e.g., cough, sob, wheezing, fever, chills, sweat or additional symptoms) *Go to specific symptom protocol after initial questions. Productive cough  "How long have symptoms been present?" 3-4 days  MEDICINES:   "Have you used any OTC meds to help with symptoms?" Yes If yes, ask "What medications?" mucinex   "Have you used your inhalers/maintenance medication?" Yes If yes, "What medications?" albuterol   If inhaler, ask "How many puffs and how often?" Note: Review instructions on medication in the chart. PRN  OXYGEN : "Do you wear supplemental oxygen ?" No If yes, "How many liters are you supposed to use?" N/A  Reason for Disposition  Coughing up rusty-colored (reddish-brown) sputum  Answer Assessment - Initial Assessment Questions Pt reports productive cough x 3-4 days. States he is hoarse with a sore/burning throat. Taking mucinex . Using inhaler PRN without much change.   1. ONSET: When did the cough begin?      3-4 days 2. SEVERITY: How bad is the cough today?      Severe at night 3. SPUTUM: Describe the color of your sputum (e.g., none, dry cough; clear, white, yellow, green)     Possibly pink-tinged but believes it could be what he was drinking. Sputum primarily yellow/grey. 4. HEMOPTYSIS: Are you coughing up any blood? If Yes, ask: How much? (e.g., flecks, streaks, tablespoons, etc.)     See above 5.  DIFFICULTY BREATHING: Are you having difficulty breathing? If Yes, ask: How bad is it? (e.g., mild, moderate, severe)      Not worse than baseline/COPD 6. FEVER: Do you have a fever? If Yes, ask: What is your temperature, how was it measured, and when did it start?     Denies 7. CARDIAC HISTORY: Do you have any history of heart disease? (e.g., heart attack, congestive heart failure)      Dysrhythmia 8. LUNG HISTORY: Do you have any history of lung disease?  (e.g., pulmonary embolus, asthma, emphysema)     Lung ca, COPD  Protocols used: Cough - Acute Productive-A-AH   Message from Isabell A sent at 01/07/2024  8:44 AM EST  Reason for Triage: Patient experiencing a cough with phlegm for the past 3-4 days, would like to know what he can take for this.  Callback number: [phone]

## 2024-01-07 NOTE — Telephone Encounter (Signed)
 Pt is seeing Dr Kara today. NFN

## 2024-01-07 NOTE — Progress Notes (Signed)
 Established Patient Pulmonology Office Visit   Subjective:  Patient ID: Elijah Marshall, male    DOB: 05/31/1950  MRN: [id number]  CC:  Chief Complaint  Patient presents with   Medical Management of Chronic Issues    Acute - pt states coughing up a lot of yellow x 1 week     Discussed the use of AI scribe software for clinical note transcription with the patient, who gave verbal consent to proceed.  History of Present Illness Elijah Marshall is a 73 year old male with COPD and stage 1A non-small cell carcinoma of the right lung who presents with cough and mucus production.  He has had cough with increased mucus for about 10 to 11 days, worsening over the last 4 to 5 days. He notes a raspy voice, throat burning, and new yellow mucus today. He denies fever or chills. He has chest soreness from coughing and nocturnal cough that wakes him to expectorate mucus. He has some shortness of breath that he cannot clearly distinguish from his baseline COPD.  His COPD regimen is Stiolto 2 puffs daily with as-needed albuterol , plus montelukast . He occasionally uses ipratropium or Atrovent  nasal spray. He has not used fluticasone  recently. He was treated with Augmentin  for suspected pneumonia after hemoptysis in March 2025. CT lung cancer screening in December 2024 was Lung RADS 2. He has not used his prescribed nebulizer recently and has not received a flu shot this season.        ROS    Current Outpatient Medications:    albuterol  (VENTOLIN  HFA) 108 (90 Base) MCG/ACT inhaler, Inhale 2 puffs into the lungs every 6 (six) hours as needed for wheezing or shortness of breath., Disp: 1 each, Rfl: 6   atorvastatin  (LIPITOR) 40 MG tablet, TAKE 1 TABLET BY MOUTH EVERY DAY (Patient taking differently: Take 40 mg by mouth daily.), Disp: 90 tablet, Rfl: 0   azithromycin  (ZITHROMAX ) 250 MG tablet, Take as directed, Disp: 6 tablet, Rfl: 0   baclofen  (LIORESAL ) 10 MG tablet, Take 10 mg by mouth daily as  needed for muscle spasms., Disp: , Rfl:    Brimonidine Tartrate (LUMIFY) 0.025 % SOLN, Apply 1 drop to eye daily., Disp: , Rfl:    cyclobenzaprine  (FLEXERIL ) 10 MG tablet, Take 1 tablet (10 mg total) by mouth 2 (two) times daily as needed for muscle spasms., Disp: 20 tablet, Rfl: 0   famotidine  (PEPCID ) 40 MG tablet, Take 1 tablet (40 mg total) by mouth 2 (two) times daily. (Patient taking differently: Take 40 mg by mouth daily as needed for heartburn.), Disp: 180 tablet, Rfl: 3   fluticasone  (FLONASE ) 50 MCG/ACT nasal spray, SPRAY 1 SPRAY INTO BOTH NOSTRILS DAILY., Disp: 16 mL, Rfl: 11   hydrochlorothiazide  (HYDRODIURIL ) 25 MG tablet, Take 25 mg by mouth daily., Disp: , Rfl:    HYDROcodone -acetaminophen  (NORCO/VICODIN) 5-325 MG tablet, Take 1-2 tablets by mouth every 4 (four) hours as needed for moderate pain or severe pain., Disp: 40 tablet, Rfl: 0   ipratropium (ATROVENT ) 0.03 % nasal spray, Place 2 sprays into both nostrils every 12 (twelve) hours., Disp: 30 mL, Rfl: 5   lansoprazole  (PREVACID ) 30 MG capsule, TAKE 1 CAPSULE (30 MG TOTAL) BY MOUTH 2 (TWO) TIMES DAILY. (Patient taking differently: Take 30 mg by mouth 2 (two) times daily before a meal.), Disp: 180 capsule, Rfl: 1   levocetirizine (XYZAL ) 5 MG tablet, Take 5 mg by mouth every evening., Disp: , Rfl:    metoprolol  succinate (TOPROL -XL)  50 MG 24 hr tablet, TAKE 1 TABLET BY MOUTH EVERY DAY WITH OR IMMEDIATELY FOLLOWING A MEAL, Disp: 15 tablet, Rfl: 0   montelukast  (SINGULAIR ) 10 MG tablet, Take 10 mg by mouth daily., Disp: , Rfl:    Multiple Vitamin (MULTIVITAMIN ADULT PO), Take by mouth., Disp: , Rfl:    OXYGEN , Inhale 2 L into the lungs daily as needed (with exertion only)., Disp: , Rfl:    polyethylene glycol (MIRALAX  / GLYCOLAX ) 17 g packet, Take 17 g by mouth daily., Disp: , Rfl:    predniSONE  (DELTASONE ) 10 MG tablet, Take 4 tablets (40 mg total) by mouth daily with breakfast., Disp: 20 tablet, Rfl: 0   sildenafil (VIAGRA) 100 MG  tablet, Take 100 mg by mouth daily as needed for erectile dysfunction., Disp: , Rfl:    Vitamin D, Ergocalciferol, (DRISDOL) 1.25 MG (50000 UNIT) CAPS capsule, Take 50,000 Units by mouth once a week., Disp: , Rfl:    Tiotropium Bromide -Olodaterol (STIOLTO RESPIMAT ) 2.5-2.5 MCG/ACT AERS, Inhale 2 puffs into the lungs daily., Disp: 4 g, Rfl: 5      Objective:  BP 132/83   Pulse 91   Ht 5' 9 (1.753 m) Comment: per pt  Wt 163 lb (73.9 kg)   SpO2 96%   BMI 24.07 kg/m     Physical Exam Constitutional:      General: He is not in acute distress. HENT:     Head: Normocephalic and atraumatic.  Eyes:     Extraocular Movements: Extraocular movements intact.     Conjunctiva/sclera: Conjunctivae normal.     Pupils: Pupils are equal, round, and reactive to light.  Cardiovascular:     Rate and Rhythm: Normal rate and regular rhythm.     Pulses: Normal pulses.     Heart sounds: Normal heart sounds. No murmur heard. Pulmonary:     Breath sounds: Decreased air movement present.  Abdominal:     General: Bowel sounds are normal.     Palpations: Abdomen is soft.  Musculoskeletal:     Right lower leg: No edema.     Left lower leg: No edema.  Lymphadenopathy:     Cervical: No cervical adenopathy.  Skin:    General: Skin is warm and dry.  Neurological:     General: No focal deficit present.     Mental Status: He is alert.  Psychiatric:        Mood and Affect: Mood normal.        Behavior: Behavior normal.        Thought Content: Thought content normal.        Judgment: Judgment normal.      Diagnostic Review:  Last CBC Lab Results  Component Value Date   WBC 6.4 10/03/2023   HGB 17.2 (H) 10/03/2023   HCT 51.4 10/03/2023   MCV 86.5 10/03/2023   MCH 29.0 10/03/2023   RDW 12.7 10/03/2023   PLT 210 10/03/2023   Last metabolic panel Lab Results  Component Value Date   GLUCOSE 90 08/28/2023   NA 143 08/28/2023   K 4.1 08/28/2023   CL 101 08/28/2023   CO2 33 (H) 08/28/2023    BUN 12 08/28/2023   CREATININE 1.29 (H) 08/28/2023   GFRNONAA 59 (L) 08/28/2023   CALCIUM  10.1 08/28/2023   PHOS 2.6 11/06/2020   PROT 7.5 08/28/2023   ALBUMIN 4.5 08/28/2023   BILITOT 0.8 08/28/2023   ALKPHOS 106 08/28/2023   AST 27 08/28/2023   ALT 24 08/28/2023  ANIONGAP 9 08/28/2023       Assessment & Plan:   Assessment & Plan COPD mixed type (HCC)  Orders:   Tiotropium Bromide -Olodaterol (STIOLTO RESPIMAT ) 2.5-2.5 MCG/ACT AERS; Inhale 2 puffs into the lungs daily.   azithromycin  (ZITHROMAX ) 250 MG tablet; Take as directed   predniSONE  (DELTASONE ) 10 MG tablet; Take 4 tablets (40 mg total) by mouth daily with breakfast.   Assessment and Plan Assessment & Plan Acute exacerbation of chronic obstructive pulmonary disease (COPD) - Azithromycin  (Z-Pak) for 5 days: 2 tablets on day 1, then 1 tablet daily for 4 days. - Prednisone  40 mg daily for 5 days. - Monitor symptoms; contact clinic if no improvement. - Discussed nebulizer treatment for mucus clearance. He will let us  know if he would like a nebulizer machine. - Delay flu vaccination until recovery and cessation of steroids.  History of stage 1A non-small cell carcinoma of right lung, status post resection Stage 1A non-small cell carcinoma of right lung, resected in 2017. Recent CT screening Lung RADS 2, low malignancy suspicion. - Continue with scheduled CT chest scan on December 8th. - Maintain follow-up with Tammy in January.      No follow-ups on file.   Dorn KATHEE Chill, MD

## 2024-01-07 NOTE — Patient Instructions (Signed)
 Continue stiolto 2 puffs daily  Use albuterol  inhaler 1-2 puffs every 4-6 hours as needed  Start prednisone  40mg  daily for 5 days  Start Zpak antibiotic - day 1 take 2 tablets, then take 1 tablet thereafter for 4 days  Follow up as scheduled with Madelin Stank, NP in January

## 2024-01-07 NOTE — Assessment & Plan Note (Addendum)
  Orders:   Tiotropium Bromide -Olodaterol (STIOLTO RESPIMAT ) 2.5-2.5 MCG/ACT AERS; Inhale 2 puffs into the lungs daily.   azithromycin  (ZITHROMAX ) 250 MG tablet; Take as directed   predniSONE  (DELTASONE ) 10 MG tablet; Take 4 tablets (40 mg total) by mouth daily with breakfast.

## 2024-01-09 ENCOUNTER — Other Ambulatory Visit: Payer: Self-pay | Admitting: Internal Medicine

## 2024-01-11 ENCOUNTER — Telehealth: Payer: Self-pay

## 2024-01-11 DIAGNOSIS — J441 Chronic obstructive pulmonary disease with (acute) exacerbation: Secondary | ICD-10-CM

## 2024-01-11 MED ORDER — PREDNISONE 10 MG PO TABS: ORAL_TABLET | ORAL | 0 refills | Status: AC

## 2024-01-11 NOTE — Telephone Encounter (Signed)
 Full course of antibiotic of 5 days should be sufficient.  Will extend steroid taper. For a few more days.  JD

## 2024-01-11 NOTE — Telephone Encounter (Unsigned)
 Copied from CRM #[id number]. Topic: Clinical - Medication Question >> Jan 11, 2024  9:16 AM Devaughn RAMAN wrote: Reason for CRM: Pt called requesting an additional medication for coughing phelgm and bronchitis. Pt stated he is almost out of the medication Dr.Dewald prescribed him on Monday 12/3. Pt would like an antibiotic for an additional few days.

## 2024-01-14 ENCOUNTER — Ambulatory Visit (HOSPITAL_COMMUNITY)

## 2024-01-14 NOTE — Telephone Encounter (Signed)
 Spoke with patient VBU.   - NFN

## 2024-01-18 ENCOUNTER — Ambulatory Visit (HOSPITAL_COMMUNITY): Admission: RE | Admit: 2024-01-18 | Discharge: 2024-01-18 | Attending: Acute Care | Admitting: Acute Care

## 2024-01-18 DIAGNOSIS — Z122 Encounter for screening for malignant neoplasm of respiratory organs: Secondary | ICD-10-CM

## 2024-01-18 DIAGNOSIS — Z87891 Personal history of nicotine dependence: Secondary | ICD-10-CM

## 2024-01-23 ENCOUNTER — Other Ambulatory Visit: Payer: Self-pay

## 2024-01-23 DIAGNOSIS — Z87891 Personal history of nicotine dependence: Secondary | ICD-10-CM

## 2024-01-23 DIAGNOSIS — Z122 Encounter for screening for malignant neoplasm of respiratory organs: Secondary | ICD-10-CM

## 2024-02-06 ENCOUNTER — Telehealth: Payer: Self-pay

## 2024-02-06 NOTE — Telephone Encounter (Signed)
 He just had antibiotics with z pack and extended prednisone  course. If no better, recommend in person evaluation at [facility] or with PCP. Thanks.

## 2024-02-06 NOTE — Telephone Encounter (Signed)
" °  Spoke with patient Elijah Marshall, will go to UC or PCP   Copied from CRM #[id number]. Topic: Clinical - Medication Question >> Feb 06, 2024  9:55 AM [name] wrote: Reason for CRM: Patient is calling to request a round of antibiotics from provider for yellow sputum following a course of steroids that have not worked. Patient states has been coughing for months and has a pale yellow hue to it. Has taken OTC medications like Mucinex  but not clearing his symptoms any. Please advise patient. "

## 2024-02-21 ENCOUNTER — Telehealth: Payer: Self-pay

## 2024-02-21 NOTE — Telephone Encounter (Signed)
 Copied from CRM [id number]. Topic: Clinical - Medication Question >> Feb 20, 2024  4:02 PM Benton KIDD wrote: Reason for CRM: patient is calling because i need to see if dr wert has any demos of inhalers that i use . The cost is like 400 dollars right now. Patient trying to come up with the money   . wondering do he have any extras to the end of the  month  Tiotropium Bromide -Olodaterol (STIOLTO RESPIMAT ) 2.5-2.5 MCG/ACT AERS Please give patient a call concerning getting samples or demos of this inhaler till the end of the month  [phone]  I checked the sample closet and as of right now we do not have any samples. I called and spoke to pt. I informed pt that we do not have samples but to check with our office again next week to see if we have any then. Pt verbalized understanding. Pt stated he only has 3 days left of his Stiolto. I informed pt to keep his Albuterol  rescue inhaler with him at all times and to let us  know if there I anything we can do to help. Pt verbalized understanding. NFN

## 2024-02-29 ENCOUNTER — Ambulatory Visit: Admitting: Cardiology

## 2024-03-03 ENCOUNTER — Ambulatory Visit: Admitting: Adult Health

## 2024-03-03 ENCOUNTER — Encounter

## 2024-03-25 ENCOUNTER — Ambulatory Visit: Admitting: General Practice

## 2024-03-26 ENCOUNTER — Encounter

## 2024-03-27 ENCOUNTER — Ambulatory Visit: Admitting: Pulmonary Disease
